# Patient Record
Sex: Male | Born: 1940 | Race: White | Hispanic: No | Marital: Married | Smoking: Never smoker
Health system: Southern US, Community
[De-identification: ages and names within clinical notes are randomized; demographics above are authoritative.]

## PROBLEM LIST (undated history)

## (undated) DIAGNOSIS — R7303 Prediabetes: Secondary | ICD-10-CM

## (undated) DIAGNOSIS — I428 Other cardiomyopathies: Secondary | ICD-10-CM

## (undated) DIAGNOSIS — M1990 Unspecified osteoarthritis, unspecified site: Secondary | ICD-10-CM

## (undated) DIAGNOSIS — I480 Paroxysmal atrial fibrillation: Secondary | ICD-10-CM

## (undated) DIAGNOSIS — M109 Gout, unspecified: Secondary | ICD-10-CM

## (undated) DIAGNOSIS — I10 Essential (primary) hypertension: Secondary | ICD-10-CM

## (undated) DIAGNOSIS — Z87442 Personal history of urinary calculi: Secondary | ICD-10-CM

## (undated) DIAGNOSIS — R600 Localized edema: Secondary | ICD-10-CM

## (undated) DIAGNOSIS — F419 Anxiety disorder, unspecified: Secondary | ICD-10-CM

## (undated) DIAGNOSIS — C189 Malignant neoplasm of colon, unspecified: Secondary | ICD-10-CM

## (undated) HISTORY — DX: Localized edema: R60.0

## (undated) HISTORY — PX: NASAL SEPTUM SURGERY: SHX37

## (undated) HISTORY — PX: TONSILLECTOMY: SUR1361

## (undated) HISTORY — DX: Essential (primary) hypertension: I10

## (undated) HISTORY — DX: Paroxysmal atrial fibrillation: I48.0

## (undated) HISTORY — DX: Other cardiomyopathies: I42.8

---

## 2009-01-08 ENCOUNTER — Ambulatory Visit: Payer: Self-pay | Admitting: Family Medicine

## 2009-05-01 ENCOUNTER — Ambulatory Visit: Payer: Self-pay | Admitting: Family Medicine

## 2009-11-09 ENCOUNTER — Ambulatory Visit: Payer: Self-pay | Admitting: Family Medicine

## 2010-02-11 ENCOUNTER — Ambulatory Visit: Payer: Self-pay | Admitting: Family Medicine

## 2010-09-18 ENCOUNTER — Other Ambulatory Visit: Payer: Self-pay

## 2010-09-18 MED ORDER — BENAZEPRIL HCL 40 MG PO TABS: 40.0000 mg | ORAL_TABLET | Freq: Every day | ORAL | Status: DC

## 2010-09-24 ENCOUNTER — Other Ambulatory Visit: Payer: Self-pay | Admitting: Family Medicine

## 2010-09-25 ENCOUNTER — Telehealth: Payer: Self-pay | Admitting: Family Medicine

## 2010-09-25 NOTE — Telephone Encounter (Signed)
What do you want me to tell him  

## 2010-09-25 NOTE — Telephone Encounter (Signed)
Benazepril 40 mg

## 2010-09-25 NOTE — Telephone Encounter (Signed)
Called pharmacy and pt to let him know the 40 mg is what he is to take

## 2011-01-06 ENCOUNTER — Other Ambulatory Visit (INDEPENDENT_AMBULATORY_CARE_PROVIDER_SITE_OTHER): Payer: Medicare Other

## 2011-01-06 DIAGNOSIS — Z23 Encounter for immunization: Secondary | ICD-10-CM

## 2011-01-14 ENCOUNTER — Encounter: Payer: Self-pay | Admitting: Family Medicine

## 2011-01-15 ENCOUNTER — Encounter: Payer: Self-pay | Admitting: Family Medicine

## 2011-01-15 ENCOUNTER — Ambulatory Visit (INDEPENDENT_AMBULATORY_CARE_PROVIDER_SITE_OTHER): Payer: Medicare Other | Admitting: Family Medicine

## 2011-01-15 VITALS — BP 112/72 | HR 62 | Ht 69.0 in | Wt 269.0 lb

## 2011-01-15 DIAGNOSIS — Z862 Personal history of diseases of the blood and blood-forming organs and certain disorders involving the immune mechanism: Secondary | ICD-10-CM

## 2011-01-15 DIAGNOSIS — Z8739 Personal history of other diseases of the musculoskeletal system and connective tissue: Secondary | ICD-10-CM | POA: Insufficient documentation

## 2011-01-15 DIAGNOSIS — N529 Male erectile dysfunction, unspecified: Secondary | ICD-10-CM

## 2011-01-15 DIAGNOSIS — E669 Obesity, unspecified: Secondary | ICD-10-CM

## 2011-01-15 DIAGNOSIS — Z8639 Personal history of other endocrine, nutritional and metabolic disease: Secondary | ICD-10-CM

## 2011-01-15 DIAGNOSIS — R7302 Impaired glucose tolerance (oral): Secondary | ICD-10-CM

## 2011-01-15 DIAGNOSIS — R7309 Other abnormal glucose: Secondary | ICD-10-CM

## 2011-01-15 DIAGNOSIS — I10 Essential (primary) hypertension: Secondary | ICD-10-CM

## 2011-01-15 LAB — CBC WITH DIFFERENTIAL/PLATELET
Basophils Absolute: 0 10*3/uL (ref 0.0–0.1)
Basophils Relative: 1 % (ref 0–1)
Eosinophils Absolute: 0.2 10*3/uL (ref 0.0–0.7)
Eosinophils Relative: 3 % (ref 0–5)
HCT: 43.7 % (ref 39.0–52.0)
Lymphocytes Relative: 33 % (ref 12–46)
MCH: 31.8 pg (ref 26.0–34.0)
MCHC: 33.6 g/dL (ref 30.0–36.0)
MCV: 94.6 fL (ref 78.0–100.0)
Monocytes Absolute: 0.6 10*3/uL (ref 0.1–1.0)
RDW: 13.2 % (ref 11.5–15.5)

## 2011-01-15 LAB — COMPREHENSIVE METABOLIC PANEL
AST: 27 U/L (ref 0–37)
BUN: 15 mg/dL (ref 6–23)
Calcium: 9.5 mg/dL (ref 8.4–10.5)
Chloride: 105 mEq/L (ref 96–112)
Creat: 1.49 mg/dL — ABNORMAL HIGH (ref 0.50–1.35)
Total Bilirubin: 1.2 mg/dL (ref 0.3–1.2)

## 2011-01-15 LAB — LIPID PANEL
HDL: 35 mg/dL — ABNORMAL LOW (ref >39)
Total CHOL/HDL Ratio: 4.6 Ratio

## 2011-01-15 NOTE — Patient Instructions (Signed)
We will call you with the results of your blood work.

## 2011-01-15 NOTE — Progress Notes (Signed)
Subjective:    Patient ID: Edwin Wall, male    DOB: Aug 08, 1940, 70 y.o.   MRN: [id number]  HPI He is here for an interval evaluation. He continues on his blood pressure medication as well as aspirin and occasional use of Indocin. He has a previous history of gout but has had very little difficulty with this stating that he thinks the blood pressure medication has kept this from occurring. He has had occasional difficulty with cough and congestion. He also complains of a one-year history of inability to get and maintain an erection. His marriage is going quite well. Family history and social history were reviewed.   Review of Systems  Constitutional: Negative.   HENT: Negative.   Eyes: Negative.   Respiratory: Negative.   Cardiovascular: Negative.   Gastrointestinal: Negative.   Genitourinary: Negative.   Musculoskeletal: Negative.   Skin: Negative.   Neurological: Negative.   Hematological: Negative.   Psychiatric/Behavioral: Negative.        Objective:   Physical Exam BP 112/72  Pulse 62  Ht 5\' 9"  (1.753 m)  Wt 269 lb (122.018 kg)  BMI 39.72 kg/m2  General Appearance:    Alert, cooperative, no distress, appears stated age  Head:    Normocephalic, without obvious abnormality, atraumatic  Eyes:    PERRL, conjunctiva/corneas clear, EOM's intact, fundi    benign  Ears:    Normal TM's and external ear canals  Nose:   Nares normal, mucosa normal, no drainage or sinus   tenderness  Throat:   Lips, mucosa, and tongue normal; teeth and gums normal  Neck:   Supple, no lymphadenopathy;  thyroid:  no   enlargement/tenderness/nodules; no carotid   bruit or JVD  Back:    Spine nontender, no curvature, ROM normal, no CVA     tenderness  Lungs:     Clear to auscultation bilaterally without wheezes, rales or     ronchi; respirations unlabored  Chest Wall:    No tenderness or deformity   Heart:    Regular rate and rhythm, S1 and S2 normal, no murmur, rub   or gallop  Breast  Exam:    No chest wall tenderness, masses or gynecomastia  Abdomen:     Soft, non-tender, nondistended, normoactive bowel sounds,    no masses, no hepatosplenomegaly  Genitalia:    Normal male external genitalia without lesions.  Testicles without masses.  No inguinal hernias.  Rectal:   deferred   Extremities:   No clubbing, cyanosis or edema  Pulses:   2+ and symmetric all extremities  Skin:   Skin color, texture, turgor normal, no rashes or lesions  Lymph nodes:   Cervical, supraclavicular, and axillary nodes normal  Neurologic:   CNII-XII intact, normal strength, sensation and gait; reflexes 2+ and symmetric throughout          Psych:   Normal mood, affect, hygiene and grooming.          Assessment & Plan:   1. Obesity (BMI 30-39.9)  CBC with Differential, Comprehensive metabolic panel, Lipid panel  2. Hypertension  CBC with Differential, Comprehensive metabolic panel, Lipid panel  3. History of gout    4. Glucose intolerance (impaired glucose tolerance)  CBC with Differential, Comprehensive metabolic panel, Lipid panel  5. ED (erectile dysfunction)     I discussed diet and exercise with him in regard to cut back on carbs and increasing his physical activities. I also discussed erectile dysfunction. Given a sample of Cialis 20 mg  with instructions on proper and possible side effects. He will let me know how this works.

## 2011-03-19 ENCOUNTER — Telehealth: Payer: Self-pay | Admitting: Family Medicine

## 2011-03-19 NOTE — Telephone Encounter (Signed)
DONE

## 2011-05-18 ENCOUNTER — Other Ambulatory Visit: Payer: Self-pay | Admitting: Family Medicine

## 2016-11-11 ENCOUNTER — Other Ambulatory Visit: Payer: Self-pay | Admitting: Gastroenterology

## 2016-11-11 DIAGNOSIS — R933 Abnormal findings on diagnostic imaging of other parts of digestive tract: Secondary | ICD-10-CM

## 2016-11-11 DIAGNOSIS — D3A025 Benign carcinoid tumor of the sigmoid colon: Secondary | ICD-10-CM

## 2016-11-11 DIAGNOSIS — R140 Abdominal distension (gaseous): Secondary | ICD-10-CM

## 2016-11-11 HISTORY — PX: COLONOSCOPY W/ BIOPSIES AND POLYPECTOMY: SHX1376

## 2016-11-13 ENCOUNTER — Ambulatory Visit
Admission: RE | Admit: 2016-11-13 | Discharge: 2016-11-13 | Disposition: A | Discharge status: Home / Self Care | Payer: Medicare Other | Source: Ambulatory Visit | Attending: Gastroenterology | Admitting: Gastroenterology

## 2016-11-13 DIAGNOSIS — R140 Abdominal distension (gaseous): Secondary | ICD-10-CM

## 2016-11-13 DIAGNOSIS — R933 Abnormal findings on diagnostic imaging of other parts of digestive tract: Secondary | ICD-10-CM

## 2016-11-13 DIAGNOSIS — D3A025 Benign carcinoid tumor of the sigmoid colon: Secondary | ICD-10-CM

## 2016-11-13 MED ORDER — IOPAMIDOL (ISOVUE-300) INJECTION 61%
100.0000 mL | Freq: Once | INTRAVENOUS | Status: AC | PRN
  Administered 2016-11-13: 80 mL via INTRAVENOUS

## 2016-11-14 IMAGING — CT CT ABD-PELV W/ CM
1 of 3 series · 13 of 32 positions shown, 17 images · IV contrast (APPLIED)
Comparison: None.

CLINICAL DATA: Sigmoid carcinoma detected on recent colonoscopy

EXAM:
CT ABDOMEN AND PELVIS WITH CONTRAST
TECHNIQUE: Multidetector CT imaging of the abdomen and pelvis was performed
using the standard protocol following bolus administration of
intravenous contrast. Oral contrast was also administered.
CONTRAST:  80mL [2Q] IOPAMIDOL ([2Q]) INJECTION 61%

[Series 2: abd/pelvis w/cm · axial · 0.88mm/px · z∈[-515,-35]mm · 13 of 108 slices shown, 17 images]
[im 6/108  soft-tissue]
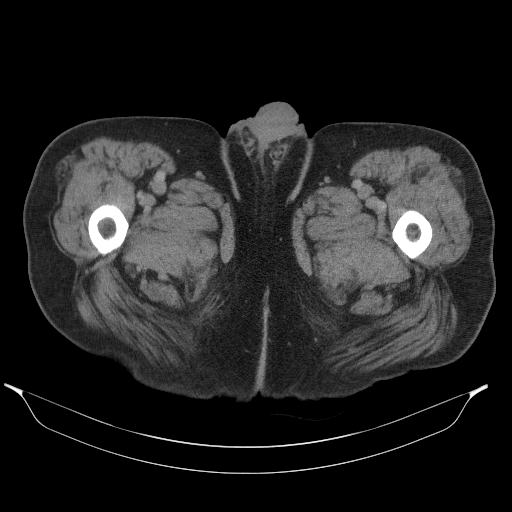
[im 6/108  bone]
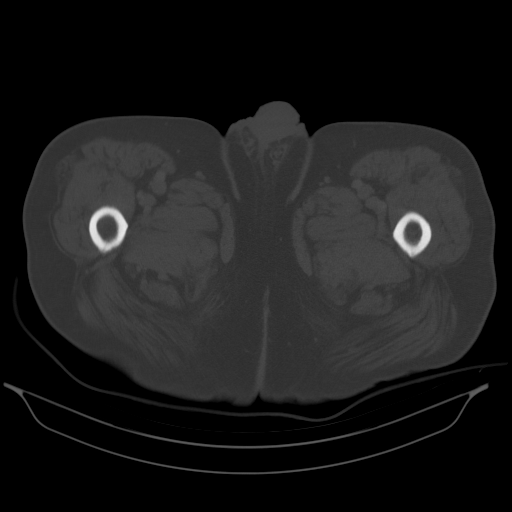
[im 17/108  soft-tissue]
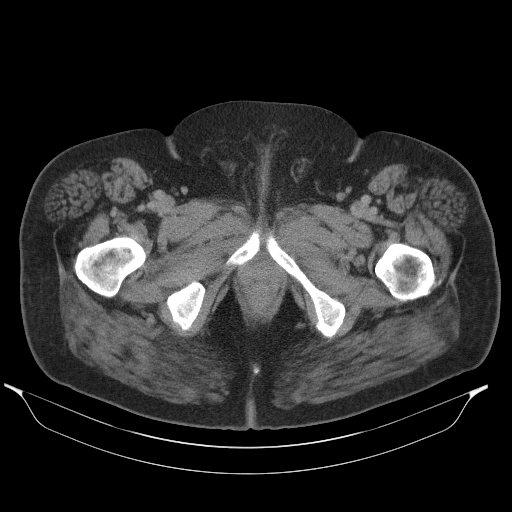
[im 27/108  soft-tissue]
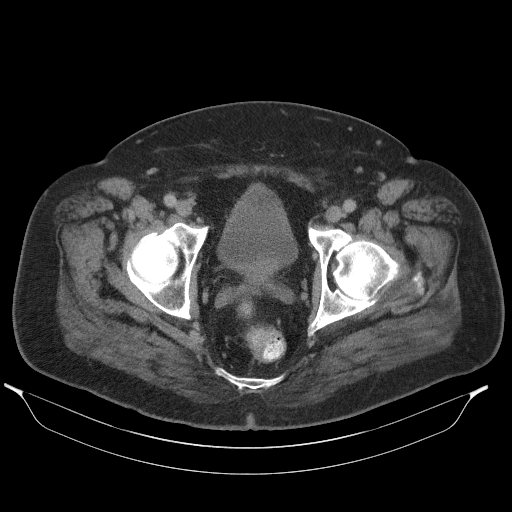
[im 38/108  soft-tissue]
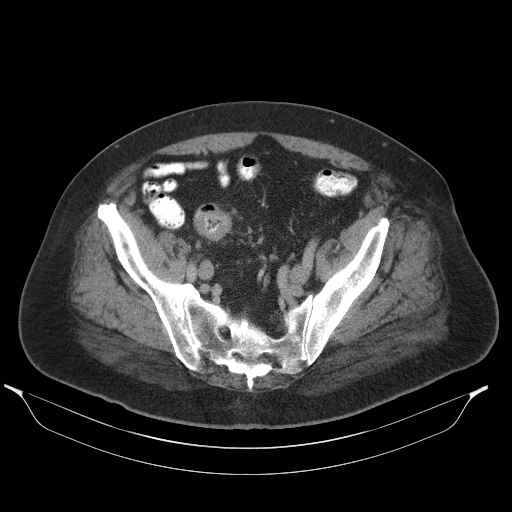
[im 43/108  soft-tissue]
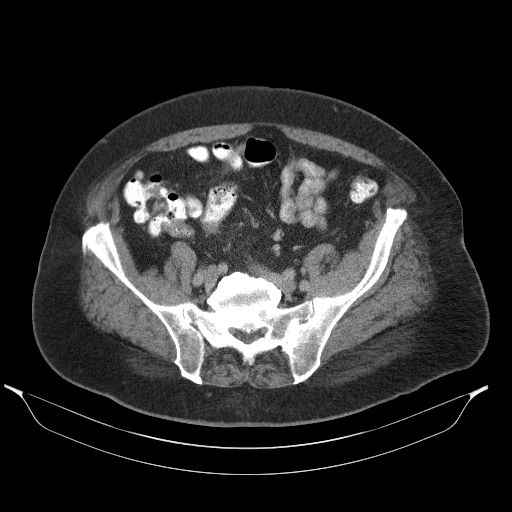
[im 54/108  soft-tissue]
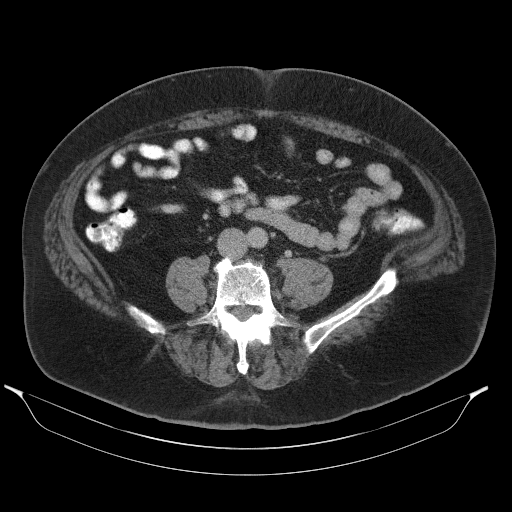
[im 65/108  soft-tissue]
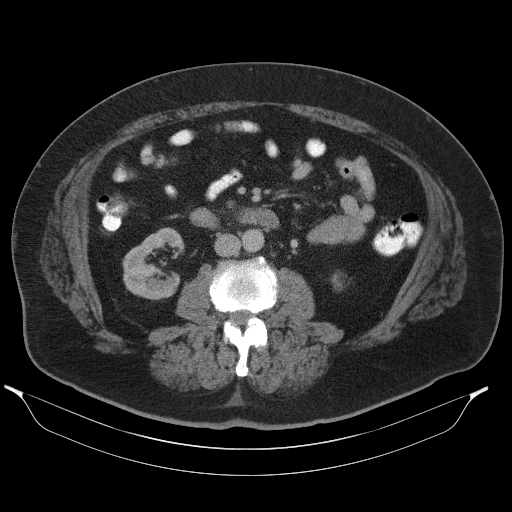
[im 70/108  soft-tissue]
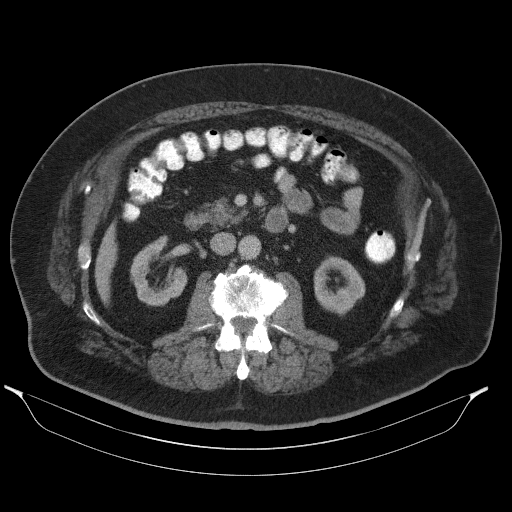
[im 81/108  soft-tissue]
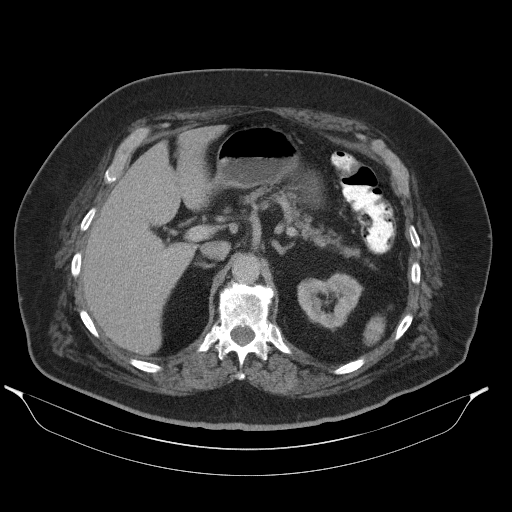
[im 81/108  bone]
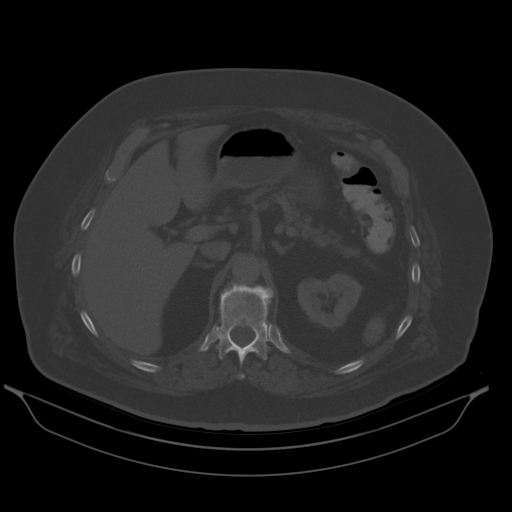
[im 86/108  lung]
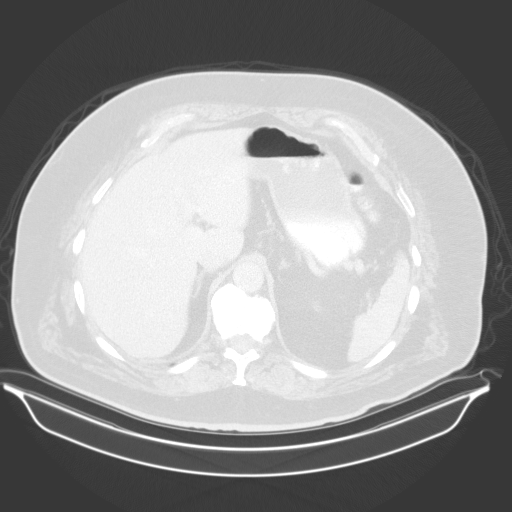
[im 91/108  soft-tissue]
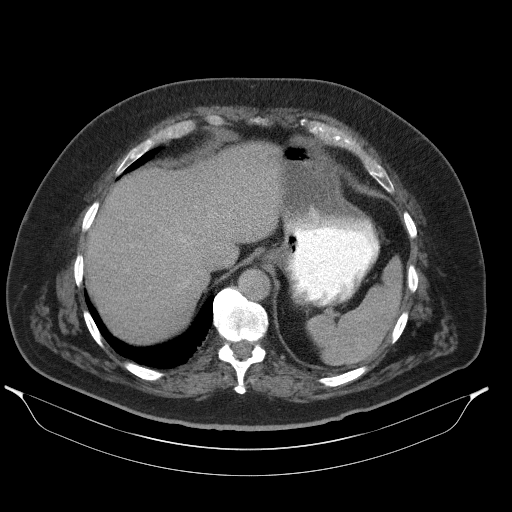
[im 91/108  lung]
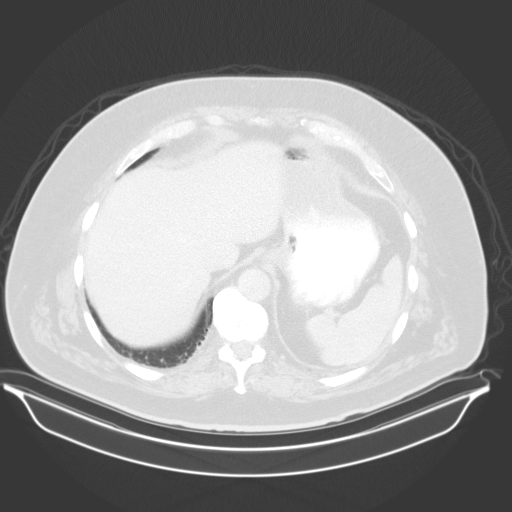
[im 97/108  lung]
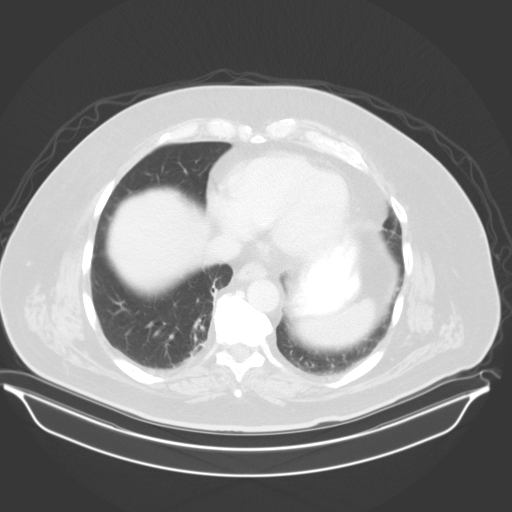
[im 102/108  soft-tissue]
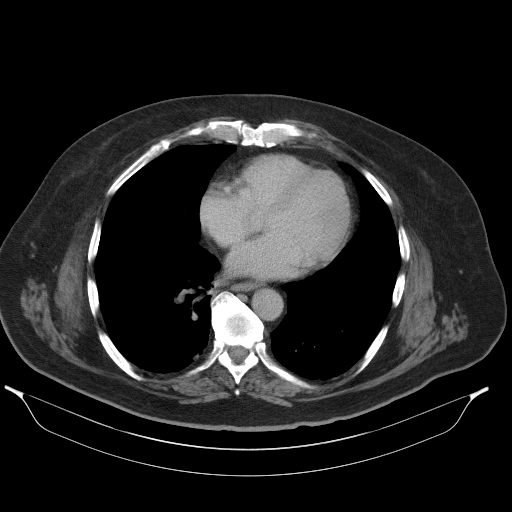
[im 102/108  lung]
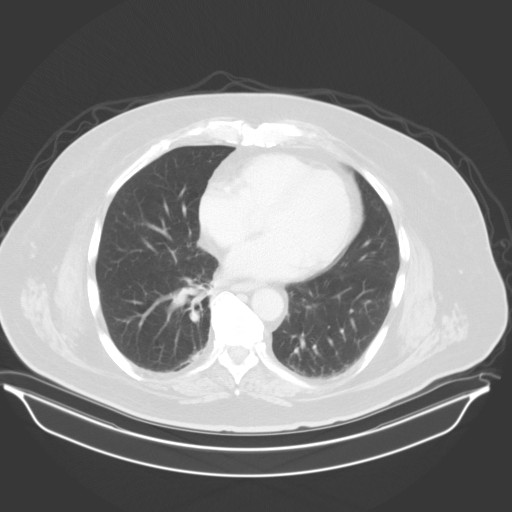

[13 of 32 positions shown; findings below may reference images not displayed]

FINDINGS: Lower chest: There is mild bibasilar atelectasis. Lung bases
otherwise are clear.

Hepatobiliary: No focal liver lesions are apparent. Gallbladder is
contracted without wall thickening. There is no biliary duct
dilatation.

Pancreas: No pancreatic mass or inflammatory focus.

Spleen: No splenic lesions are evident.

Adrenals/Urinary Tract: Adrenals appear normal bilaterally. Kidneys
bilaterally show no evident mass or hydronephrosis on either side.
Mild fetal lobulation is noted, an anatomic variant. There is no
renal or ureteral calculus apparent on either side. Urinary bladder
is midline with wall thickness within normal limits.

Stomach/Bowel: There is somewhat circumferential wall thickening in
the mid the distal sigmoid colon, likely the site of carcinoma
detected on recent colonoscopy. There is no surrounding mesenteric
thickening or extension of tumor beyond the wall of the colon in
this area. No other bowel wall thickening is noted elsewhere in the
bowel. No mesenteric thickening evident elsewhere. No bowel
obstruction. No free air or portal venous air.

Vascular/Lymphatic: There are foci of atherosclerotic calcification
in the aorta. There is no aneurysm evident. There is moderate
calcification in each proximal renal artery. Major mesenteric
vessels appear patent in the abdomen and pelvis. There is no
appreciable adenopathy in the abdomen or pelvis.

Reproductive: Prostate and seminal vesicles appear normal in size
and contour. No pelvic mass evident.

Other: Appendix appears unremarkable. No abscess or ascites is
apparent in the abdomen or pelvis. There is a rather minimal ventral
hernia containing only fat.

Musculoskeletal: There is degenerative change in the lumbar spine.
There is moderately severe spinal stenosis at L3-4 and L4-5 due to
diffuse disc protrusion and bony hypertrophy. There are no blastic
or lytic bone lesions. There is no intramuscular or abdominal wall
lesion.
IMPRESSION: 1. Somewhat circumferential colonic wall thickening in the mid to
distal sigmoid colon extending over a distance of approximately
cm, best appreciable on coronal slice 50 series 3, sagittal slice 55
series 4, and axial slices 71 through 75 series 2. This area is felt
to be indicative of neoplasm and is presumably the site of neoplasm
noted on recent colonoscopy. No similar bowel wall thickening is
seen elsewhere. The adjacent mesenteric in the mid the distal
sigmoid colon does not appear thickened, and there is no evidence by
CT of gross tumor extension beyond the wall of the colon.

2. No adenopathy. No evident liver lesions. No metastatic focus
identified in the abdomen or pelvis.

3.  No bowel obstruction.  No abscess.  Appendix appears normal.

4.  No renal or ureteral calculus.  No hydronephrosis.

5. Spinal stenosis at L3-4 and L4-5 due to diffuse bony hypertrophy
and diffuse disc protrusion. There is multilevel arthropathy in the
lower thoracic and lumbar regions.

6. Aortic atherosclerosis. Moderate calcification in each proximal
renal artery noted.

7.  Rather minimal ventral hernia containing only fat.

Aortic Atherosclerosis ([2Q]-[2Q]).

## 2016-11-17 ENCOUNTER — Telehealth: Payer: Self-pay

## 2016-11-17 NOTE — Telephone Encounter (Signed)
[name] called to clarify who would be making his oncology appointments. I relayed that I spoke with Dr. Benay Spice and patient would be seeing a surgeon from [facility] prior to meeting with Dr. Benay Spice. I also explained that I had spoken with the referral coordinator at [facility] and someone from [place] would call patient to schedule a surgical appointment. Patient verbalized understanding.

## 2016-11-17 NOTE — Progress Notes (Signed)
  Oncology Nurse Navigator Documentation  Navigator Location: [facility] (11/17/16 1237) Referral date to RadOnc/MedOnc: 11/17/16 (11/17/16 1237) )Navigator Encounter Type: Introductory phone call (11/17/16 1237)  Referral from Dr. Benson Norway at [facility] received today. I called patient to introduce myself and to explain that [facility] had received a med/onc referral from Dr. Benson Norway. I let patient know that we had also made a surgical referral and that he could expect a phone call to schedule a new patient appointment within a week or two. I gave MR. Hickam my contact information if he has any questions moving forward.   Abnormal Finding Date: 11/11/16 (11/17/16 1237)                   Treatment Phase: Abnormal Scans (11/17/16 1237) Barriers/Navigation Needs: Coordination of Care (11/17/16 1237)   Interventions: Referrals;Coordination of Care;Psycho-social support (11/17/16 1237) Referrals: Other (Surgical) (11/17/16 1237)          Acuity: Level 2 (11/17/16 1237)   Acuity Level 2: Initial guidance, education and coordination as needed;Assistance expediting appointments;Other (Surgical Referral) (11/17/16 1237)     Time Spent with Patient: 15 (11/17/16 1237)

## 2016-11-19 ENCOUNTER — Telehealth: Payer: Self-pay

## 2016-11-19 NOTE — Telephone Encounter (Signed)
Called and left VM to see if patient has any questions. Patient is scheduled with Dr. Barry Dienes on 11/21/16 @ 9:15. I left my contact information for questions or concerns.

## 2016-11-21 ENCOUNTER — Other Ambulatory Visit: Payer: Self-pay | Admitting: General Surgery

## 2016-12-12 NOTE — Pre-Procedure Instructions (Signed)
Edwin Wall  12/12/2016      [id number] [place] [id number]  [id number]: [phone]: [phone]   Your procedure is scheduled on 12/17/16.  [facility]  Call this number if you have problems the morning of surgery:  [phone]   Remember:  Do not eat food or drink liquids after midnight.  Take these medicines the morning of surgery with A SIP OF WATER ---ativan   Do not wear jewelry, make-up or nail polish.  Do not wear lotions, powders, or perfumes, or deoderant.  Do not shave 48 hours prior to surgery.  Men may shave face and neck.  Do not bring valuables to the hospital.  [facility] or [facility].  Contacts, dentures or bridgework may not be worn into surgery.  Leave your suitcase in the car.  After surgery it may be brought to your room.  For patients admitted to the hospital, discharge time will be determined by your treatment team.  Patients discharged the day of surgery will not be allowed to drive home.   Name and phone number of your driver:   Special instructions:  Do not take any aspirin,anti-inflammatories,vitamins,or herbal supplements 5-7 days prior to surgery.  Enhanced Recovery After Surgery (ERAS) Pathway   RATIONALIZATION Enhanced Recovery After Surgery (ERAS) is a multimodal, multidisciplinary approach to the care of the surgical patient. Enhanced Recovery After Surgery process implementation involves a team consisting of surgeons, anesthetists, an ERAS coordinator (often a nurse or a physician assistant), and staff from units that care for the surgical patient. Education of & buy in from patients is essential to success. The care protocol is based on published evidence.     Key strategies of Enhanced Recovery After Surgery protocols include  evidence-based adjustments through the entire surgical experience: switching from overnight fasting to carbohydrate drinks two hours before surgery, nutritional optimization, minimally invasive approaches to avoid large incisions, restriction of IV fluids to seek balance rather than large volumes, minimizing narcotics while using robust multimodal nonnarcotic pain control, early and increasing mobilization, and rapid diet advancement.    ERAS protocols have resulted in shorter length of hospital stay by 30% to 50% with similar reductions in complications.  Readmission rates and overall costs are reduced. The elements of the protocol reduce the stress of the operation to optimize recovery.  Success requires input and feedback from patients, nurses, surgeons, anesthesiologists, therapists, administration, and hospital staff.  Local multispecialty ERAS teams from hospitals are trained to implement ERAS processes. Audit of process compliance and patient outcomes are important features. Enhanced Recovery After Surgery started mainly with colorectal surgery but has been shown to improve outcomes in almost all major surgical specialties.     PATHWAY OVERVIEW Below is the elements of care when your patient is on the ERAS pathway.  These are not actual orders.   Prior to surgery Patient to receive ERAS education  Anesthesia posted by surgeon as ERAS Pathway  Preop orders note ERAS Pathway  Nutritional shakes BID x 5 days (Immune enhancing, ex: Ensure Surgery).   Consider surgery postponement if serum albumin <3. Smoking cessation education / enforcement (>4 weeks preop ideal,  especially bariatrics & hernias) Diabetes control (HgbA1C preop lab to confirm level <9). Obstructive sleep apnea screening if BMI >35 or high-risk factors MRSA screening (if POSITIVE:  decolonization & add IV Vancomycin to preop IV antibiotics) Chlorhexidine skin decontamination.           Preoperative / Short Stay PO  status:  Clear liquids under 2 hours preop Carb-loading drink upon patient waking up on morning of surgery (Ensure Pre-Surgery 283m), >2 hr preop NPO 2 hours preop ERAS            listed on Operating room sheet & Board.  Anesthesia notified IVF KVO (unless signs of hypovolemia) in Short Stay & Holding area  Post Operative Nausea and Vomiting (PONV) Identification / Intervention: Risk-stratify patient Scopolamine TD placement if ? 3 Risk Factors Ileus Prevention: Alvimopan 158mPO on call to OR VTE prophylaxis: SCDs BLE Heparin or Enoxaparin SQ on call to OR (if high risk) Pre-Emptive Pain Control: Medications on call to OR: Acetaminophen 100010mO Gabapentin 300m61m  Celecoxib 400mg51mld if age >65 o>70r > 1.5) Regional block by anesthesia (preference between surgeon & anesthesia) TAP block for open abdominal surgery, hernia, etc. Epidural   Appropriate IV antibiotics <60 minutes from incision Limit preoperative opioids and benzodiazepine         INTRAOPERATIVE PATHWAY General Anesthesia Induction Agents:  Ketamine (0.5 mg/kg IBW with induction) Rest at discretion of anesthesiologist PONV Prophylaxis  Dexamethasone mg/kg IBW (8 mg max dose, 4mg m46mdose for diabetics) Use PONV Scoring System Mechanical Ventilation             Per Lung Protective Ventilation PROMPT  Start 6-8 ml/kg tidal volume per          Ideal Body Weight (IBW*) PEEP 4 Opioids: Only use Fentanyl intraoperatively Non-Narcotic Adjuncts:  Lidocaine Infusion (1.5 mg/kg IBW/hour or 25 mcg/kg IBW/min)             Discontinue 30 minutes before extubating. Do not use lidocaine with patients with epidurals, TAP blocks, liver resection/dysfunction, or 2nd or 3rd deg heart block Ketamine  0.5 mg/kg IBW with induction Infusion (0.2 mg/kg/hr)             If patient on chronic opioids, Stop 1 hour prior to end of surgery Esmolol           or Labetalol boluses for          non-specific increase in           heart rate or blood pressure Adjust MAC of volatile agent based on other adjuvant medications  BIS-Monitor: maintain BIS range of 45-60      Normothermia (BAIR hugger, esp if core body temperature < 35C) Glycemic control (glucose < 180) Room temperature > 52F / 23C Drains (Oral, NG, abdominal) should not       be routinely placed.  If needed, discuss with surgeon           Fluid Management Maintenance Intraop:  LR or Normosol/Isolyte (begins when entering OR): 500 mL/hr Laparoscopic / Robotic 800 mL/hr Open If MAP < 60mmHg46me stepwise        approach: Ensure volatile concentration <1 MAC Administer 250            mL bolus crystalloid, repeat x1 PRN Communicate with surgeon the patients hypotensive status and additional fluid requirements Administer Albumin 25% in 100         mL SURGERY Try minimally invasive approach Double glove - ALL that are scrubbed Wound protector as possible, especially > Class 2 cases Change gown/gloves & separate closure instruments for > Class 2 cases Consider leaving SQ open  with wound vac/packing in Class 4 or unstable patients Local anesthesia field block infiltration, consider Experel (liposomal bupivacaine) diluted total 157m *IBW (in kg) = ((Ht-60) x 2.3) + 50 (male) vs + 479.5(male)         PACU PATHWAY Communicate ERAS Status to PACU team Postop Analgesia: Ice pack or heating pad to area / incision with pain Opiods: (Stepwise Recommendations)  Fentanyl as first line choice Hydromorphone as secondary medication (if fentanyl inadequate) Postop PONV prophylaxis Consider PONV repeat dose of Zofran 4 mg Phenergan 6.25 mg IV diluted in 100 mL NS Normothermia (BAIR hugger, esp if temperature <65F) Oxygen supplementation (Start with 100% NRB, wean to keep sats > 92%)     Nursing Interventions Ice pack or heating pad to area / incision with pain If diabetic or glucose > 140 mg/dl, notify physician for Glycemic Control (SSI) Order  Set   Mobilization Standing & out of bed to chair evening of surgery, POD#0 Ambulate in hallways by POD#1 PT consult as appropriate   Diet Clear liquids starting POD#0, advance diet as tolerated (ADAT) to full liquids ADAT to soft diet starting lunch time of POD#1 Nutritional shakes BID x 5 days (Immune enhancing, ex: Ensure Surgery) If patient has poorly controlled nausea or emesis, change diet to NPO except ice chips & sips with meds   Respiratory Oxygen therapy.  Continuous for 24 hours, 2L [place] minimum, keeping sats > 92%.  May wean to room air on POD#1, keeping sats > 88% Turn cough deep breathe Incentive Spirometry   IV Fluids Normal saline 563mhour overnight Saline lock IV on morning of POD#1 Normal Saline 100061mvery 8 hours PRN 500m34murs, administer over 2 hours if SBP < 90 or Urine output <250mL23m8 hours, for 2 days  Postoperative Analgesia Ice pack to area / incision with pain q4h PRN Heating pad to area / incision with pain q4h PRN  Ileus prophylaxis Alvimopan 12mg,23ml, 2 times daily, starting S+1 for 14 doses, postop Psyllium 1 packet daily.  Hold if > BM in last 24 hours.       References:     http:/SSLUsers.sep://erassociety.org     Articles for ERAS overview: KehletNichola Sizervidence-based surgical care and the evolution of fast-track surgery. Ann Surg 2008; 248:189.    Ljungqvist O, ScoHerbie Baltimorenhanced Recovery After Surgery: A Review. JAMA Surg 2017; 152:292.   Feldheiser A, AziErline Levinel. Enhanced Recovery After Surgery (ERAS) for gastrointestinal surgery, part 2: consensus statement for anaesthesia practice. Acta AnaestNevin Bloodgood 2016; 60:28988:416ou RValentina Gue Leon-Casasola OA, et al. Management of Postoperative Pain: A Clinical Practice Guideline From the American Pain Society, the American Society of Regional Anesthesia and Pain Medicine, and the American Society of  Anesthesiologists' Committee on Regional Anesthesia, Executive Committee, and AdminiGeneral Motorsin 2016; 17:131.   Stone AB, Grant MC, Pio Roda C, et al. Implementation Costs of an Enhanced Recovery After Surgery Program in the [place]: A FinancCustomer service manager on Experiences at a QuaterAetna Coll Surg 2016; 222:; 606:301Please read over the following fact sheets that you were given.

## 2016-12-15 ENCOUNTER — Encounter (HOSPITAL_COMMUNITY)
Admission: RE | Admit: 2016-12-15 | Discharge: 2016-12-15 | Disposition: A | Discharge status: Home / Self Care | Payer: Medicare Other | Source: Ambulatory Visit | Attending: General Surgery | Admitting: General Surgery

## 2016-12-15 ENCOUNTER — Encounter (HOSPITAL_COMMUNITY): Payer: Self-pay

## 2016-12-15 HISTORY — DX: Anxiety disorder, unspecified: F41.9

## 2016-12-15 HISTORY — DX: Unspecified osteoarthritis, unspecified site: M19.90

## 2016-12-15 LAB — COMPREHENSIVE METABOLIC PANEL
ALK PHOS: 57 U/L (ref 38–126)
ALT: 14 U/L — AB (ref 17–63)
ANION GAP: 9 (ref 5–15)
AST: 21 U/L (ref 15–41)
Albumin: 3.3 g/dL — ABNORMAL LOW (ref 3.5–5.0)
BUN: 18 mg/dL (ref 6–20)
CO2: 24 mmol/L (ref 22–32)
Calcium: 9.1 mg/dL (ref 8.9–10.3)
Chloride: 105 mmol/L (ref 101–111)
Creatinine, Ser: 1.5 mg/dL — ABNORMAL HIGH (ref 0.61–1.24)
GFR, EST AFRICAN AMERICAN: 51 mL/min — AB (ref >60)
GFR, EST NON AFRICAN AMERICAN: 44 mL/min — AB (ref >60)
Glucose, Bld: 134 mg/dL — ABNORMAL HIGH (ref 65–99)
Potassium: 3.4 mmol/L — ABNORMAL LOW (ref 3.5–5.1)
SODIUM: 138 mmol/L (ref 135–145)
Total Bilirubin: 1 mg/dL (ref 0.3–1.2)
Total Protein: 6.6 g/dL (ref 6.5–8.1)

## 2016-12-15 LAB — CBC WITH DIFFERENTIAL/PLATELET
Basophils Absolute: 0 10*3/uL (ref 0.0–0.1)
Basophils Relative: 1 %
EOS ABS: 0.4 10*3/uL (ref 0.0–0.7)
Eosinophils Relative: 4 %
HCT: 41 % (ref 39.0–52.0)
HEMOGLOBIN: 13.4 g/dL (ref 13.0–17.0)
Lymphocytes Relative: 25 %
Lymphs Abs: 2.2 10*3/uL (ref 0.7–4.0)
MCH: 30 pg (ref 26.0–34.0)
MCHC: 32.7 g/dL (ref 30.0–36.0)
MCV: 91.7 fL (ref 78.0–100.0)
Monocytes Absolute: 0.9 10*3/uL (ref 0.1–1.0)
Monocytes Relative: 10 %
NEUTROS PCT: 60 %
Neutro Abs: 5.2 10*3/uL (ref 1.7–7.7)
PLATELETS: 277 10*3/uL (ref 150–400)
RBC: 4.47 MIL/uL (ref 4.22–5.81)
RDW: 13.7 % (ref 11.5–15.5)
WBC: 8.7 10*3/uL (ref 4.0–10.5)

## 2016-12-15 LAB — ABO/RH: ABO/RH(D): A POS

## 2016-12-15 LAB — HEMOGLOBIN A1C
HEMOGLOBIN A1C: 6.6 % — AB (ref 4.8–5.6)
MEAN PLASMA GLUCOSE: 142.72 mg/dL

## 2016-12-15 LAB — TYPE AND SCREEN
ABO/RH(D): A POS
Antibody Screen: NEGATIVE

## 2016-12-15 LAB — PROTIME-INR
INR: 0.98
PROTHROMBIN TIME: 12.9 s (ref 11.4–15.2)

## 2016-12-15 MED ORDER — CHLORHEXIDINE GLUCONATE CLOTH 2 % EX PADS
6.0000 | MEDICATED_PAD | Freq: Once | CUTANEOUS | Status: DC
Start: 2016-12-15 — End: 2016-12-16

## 2016-12-15 MED ORDER — CHLORHEXIDINE GLUCONATE CLOTH 2 % EX PADS: 6.0000 | MEDICATED_PAD | Freq: Once | CUTANEOUS | Status: DC

## 2016-12-15 NOTE — H&P (Signed)
Edwin Wall 11/21/2016 9:54 AM Location: [facility] Patient #: [id number] DOB: [birth year] Undefined / Language: Edwin Wall / Race: White Male   History of Present Illness [name] MD; 11/21/2016 10:43 AM) The patient is a 76 year old male who presents with colorectal cancer. Patient is a 76 year old male who presented with GI bleeding. He was referred to Dr. Almyra Free of GI performed colonoscopy. He was found to have 3 small polyps in the ascending and transverse colon. There was a 15 mm polyp in the transverse colon. 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 He is referred for resection. MSI testing is pending. The patient denies abdominal pain. He has not had any weight loss or bloating. He denies nausea and vomiting. The healthy with a history of hypertension. He has some osteoarthritis. He denies any chest pain, dizziness, shortness of breath, exercise difficulty other than due to joints. He has not had a colonoscopy before this one. There was one set up 7 years ago that he ended up not having that he cannot remember why. His wife he is to work at [employer] but has been retired for 20 years.  Pathology is in our chart and is from [employer] number [phone].  CT abd/pelvis 11/13/2016 IMPRESSION: 1. Somewhat circumferential colonic wall thickening in the mid to distal sigmoid colon extending over a distance of approximately 2.8 cm, best appreciable on coronal slice 50 series 3, sagittal slice 55 series 4, and axial slices 71 through 75 series 2. This area is felt to be indicative of neoplasm and is presumably the site of neoplasm noted on recent colonoscopy. No similar bowel wall thickening is seen elsewhere. The adjacent mesenteric in the mid the distal sigmoid colon does not appear thickened, and there is no evidence by CT of gross tumor extension beyond the  wall of the colon.  2. No adenopathy. No evident liver lesions. No metastatic focus identified in the abdomen or pelvis.  3. No bowel obstruction. No abscess. Appendix appears normal.  4. No renal or ureteral calculus. No hydronephrosis.  5. 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 There is multilevel arthropathy in the lower thoracic and lumbar regions.  6. Aortic atherosclerosis. Moderate calcification in each proximal renal artery noted.  7. Rather minimal ventral hernia containing only fat.   Past Surgical History (Tanisha A. Owens Shark, [place]; 11/21/2016 9:54 AM) Tonsillectomy   Diagnostic Studies History (Tanisha A. Owens Shark, [place]; 11/21/2016 9:54 AM) Colonoscopy  within last year  Allergies (Tanisha A. Owens Shark, [place]; 11/21/2016 9:55 AM) No Known Drug Allergies 11/21/2016 Allergies Reconciled   Medication History (Tanisha A. Owens Shark, [place]; 11/21/2016 9:56 AM) LORazepam ('1MG'$  Tablet, Oral) Active. Benazepril-Hydrochlorothiazide (20-12.'5MG'$  Tablet, Oral) Active. GaviLyte-G (236GM For Solution, Oral) Active. Diclofenac Sodium ('75MG'$  Tablet DR, Oral) Active. Medications Reconciled  Social History (Tanisha A. Owens Shark, [place]; 11/21/2016 9:54 AM) Caffeine use  Carbonated beverages, Tea. No alcohol use  No drug use  Tobacco use  Never smoker.  Family History (Tanisha A. Owens Shark, [place]; 11/21/2016 9:54 AM) Colon Cancer  Brother. Colon Polyps  Brother. Depression  Son. Heart Disease  Brother, Father. Hypertension  Brother, Father. Respiratory Condition  Father, Mother.  Other Problems (Tanisha A. Owens Shark, [place]; 11/21/2016 9:54 AM) Anxiety Disorder  Arthritis  Colon Cancer  Hemorrhoids  High blood pressure     Review of Systems (Tanisha A. Brown RMA; 11/21/2016 9:54 AM) General Present-  Weight Loss. Not Present- Appetite Loss, Chills, Fatigue, Fever, Night Sweats and Weight Gain. Skin Present- Dryness. Not Present-  Change in Wart/Mole, Hives, Jaundice, New Lesions, Non-Healing Wounds, Rash and Ulcer. HEENT Present- Hearing Loss, Ringing in the Ears, Seasonal Allergies and Wears glasses/contact lenses. Not Present- Earache, Hoarseness, Nose Bleed, Oral Ulcers, Sinus Pain, Sore Throat, Visual Disturbances and Yellow Eyes. Respiratory Present- Chronic Cough and Snoring. Not Present- Bloody sputum, Difficulty Breathing and Wheezing. Breast Not Present- Breast Mass, Breast Pain, Nipple Discharge and Skin Changes. Cardiovascular Not Present- Chest Pain, Difficulty Breathing Lying Down, Leg Cramps, Palpitations, Rapid Heart Rate, Shortness of Breath and Swelling of Extremities. Gastrointestinal Present- Bloody Stool, Change in Bowel Habits, Chronic diarrhea and Hemorrhoids. Not Present- Abdominal Pain, Bloating, Constipation, Difficulty Swallowing, Excessive gas, Gets full quickly at meals, Indigestion, Nausea, Rectal Pain and Vomiting. Male Genitourinary Present- Impotence and Urine Leakage. Not Present- Blood in Urine, Change in Urinary Stream, Frequency, Nocturia, Painful Urination and Urgency.  Vitals (Tanisha A. Brown RMA; 11/21/2016 9:55 AM) 11/21/2016 9:54 AM Weight: 248 lb Height: 69in Body Surface Area: 2.26 m Body Mass Index: 36.62 kg/m  Temp.: 97.49F  Pulse: 83 (Regular)  P.OX: 97% (Room air) BP: 116/74 (Sitting, Left Arm, Standard)       Physical Exam [name] MD; 11/21/2016 10:44 AM) General Mental Status-Alert. General Appearance-Consistent with stated age. Hydration-Well hydrated. Voice-Normal.  Head and Neck Head-normocephalic, atraumatic with no lesions or palpable masses. Trachea-midline. Thyroid Gland Characteristics - normal size and consistency.  Eye Eyeball - Bilateral-Extraocular movements intact. Sclera/Conjunctiva - Bilateral-No scleral icterus.  Chest and Lung Exam Chest and lung exam reveals -quiet, even and easy respiratory effort with  no use of accessory muscles and on auscultation, normal breath sounds, no adventitious sounds and normal vocal resonance. Inspection Chest Wall - Normal. Back - normal.  Cardiovascular Cardiovascular examination reveals -normal heart sounds, regular rate and rhythm with no murmurs and normal pedal pulses bilaterally.  Abdomen Inspection Inspection of the abdomen reveals - No Hernias. Palpation/Percussion Palpation and Percussion of the abdomen reveal - Soft, Non Tender, No Rebound tenderness, No Rigidity (guarding) and No hepatosplenomegaly. Auscultation Auscultation of the abdomen reveals - Bowel sounds normal.  Neurologic Neurologic evaluation reveals -alert and oriented x 3 with no impairment of recent or remote memory. Mental Status-Normal.  Musculoskeletal Global Assessment -Note: no gross deformities.  Normal Exam - Left-Upper Extremity Strength Normal and Lower Extremity Strength Normal. Normal Exam - Right-Upper Extremity Strength Normal and Lower Extremity Strength Normal.  Lymphatic Head & Neck  General Head & Neck Lymphatics: Bilateral - Description - Normal. Axillary  General Axillary Region: Bilateral - Description - Normal. Tenderness - Non Tender. Femoral & Inguinal  Generalized Femoral & Inguinal Lymphatics: Bilateral - Description - No Generalized lymphadenopathy.    Assessment & Plan [name] MD; 11/21/2016 10:46 AM) CANCER OF SIGMOID COLON (C18.7) Impression: The patient presents with a new sigmoid colon cancer. The stage is not determined yet without the final path, but he has no clinical evidence of lymphatic or metastatic disease.  He will need a laparoscopic assisted sigmoid colectomy. He has not ever had prior abdominal surgery so I would expect that he would not have a significant burden of adhesions.  I discussed the surgical procedure as well as time in the hospital and postoperative recovery. I reviewed risks of surgery  including bleeding, infection, wound breakdown, hernia, blood clots, heart or lung complications, anastomotic leak potentially requiring additional surgery and potentially an ostomy. I discussed risk  of death.  The patient would like to have surgery as soon as possible. He is given a copy of the colon book. I advised him that he would have a bowel prep. He desires to have surgery at [facility] as his wife worked at [employer] for 20 years prior to her retirement. Current Plans Pt Education - flb sigmoid colon: discussed with patient and provided information. Schedule for Surgery Pt Education - [facility] Colon Bowel Prep 2018 ERAS/Miralax/Antibiotics Started Neomycin Sulfate '500MG'$ , 2 (two) Tablet SEE NOTE, #6, 11/21/2016, No Refill. Local Order: TAKE TWO TABLETS AT 2 PM, 3 PM, AND 10 PM THE DAY PRIOR TO SURGERY Started Flagyl '500MG'$ , 2 (two) Tablet SEE NOTE, #6, 11/21/2016, No Refill. Local Order: Take at 2pm, 3pm, and 10pm the day prior to your colon operation   Signed by [name], MD (11/21/2016 10:46 AM)

## 2016-12-16 LAB — CEA: CEA: 4.5 ng/mL (ref 0.0–4.7)

## 2016-12-16 NOTE — Progress Notes (Signed)
Anesthesia Chart Review:  Pt is a 76 year old male scheduled for laparoscopic sigmoid colectomy on 12/17/2016 with [name], MD  - PCP is Anastasia Pall, MD (notes in care everywhere)   PMH includes:  HTN, pre-diabetes, colon cancer. Never smoker. BMI 36  Medications include: benazepril-hctz.   BP 117/70   Pulse 73   Temp (!) 36.4 C   Resp 20   Ht 5' 9.5" (1.765 m)   Wt 248 lb 9.6 oz (112.8 kg)   SpO2 98%   BMI 36.19 kg/m   Preoperative labs reviewed.   - HbA1c 6.6, glucose 134 - Cr 1.5, BUN 18 (consistent with prior results in care everywhere)  EKG 12/15/16: Sinus rhythm with occasional PVCs. RBBB. LAFB. Bifascicular block. No prior EKG for comparison.   If no changes, I anticipate pt can proceed with surgery as scheduled.   Willeen Cass, FNP-BC [facility]/Anesthesiology Phone: [phone] 12/16/2016 4:07 PM

## 2016-12-16 NOTE — Anesthesia Preprocedure Evaluation (Addendum)
Anesthesia Evaluation  Patient identified by MRN, date of birth, ID band Patient awake    Reviewed: Allergy & Precautions, NPO status , Patient's Chart, lab work & pertinent test results  Airway Mallampati: II  TM Distance: >3 FB Neck ROM: Full    Dental  (+) Dental Advisory Given, Teeth Intact   Pulmonary neg pulmonary ROS,    Pulmonary exam normal breath sounds clear to auscultation       Cardiovascular Exercise Tolerance: Good hypertension, Normal cardiovascular exam Rhythm:Regular Rate:Normal  EKG - RBBB and LAFB   Neuro/Psych Anxiety negative neurological ROS     GI/Hepatic Neg liver ROS, Colon cancer   Endo/Other  Obesity, prediabetic  Renal/GU negative Renal ROS  negative genitourinary   Musculoskeletal  (+) Arthritis , Gout    Abdominal (+) + obese,   Peds  Hematology negative hematology ROS (+)   Anesthesia Other Findings   Reproductive/Obstetrics                           Anesthesia Physical Anesthesia Plan  ASA: II  Anesthesia Plan: General   Post-op Pain Management:    Induction: Intravenous  PONV Risk Score and Plan: 4 or greater and Ondansetron, Dexamethasone and Treatment may vary due to age or medical condition  Airway Management Planned: Oral ETT  Additional Equipment: None  Intra-op Plan:   Post-operative Plan: Extubation in OR  Informed Consent: I have reviewed the patients History and Physical, chart, labs and discussed the procedure including the risks, benefits and alternatives for the proposed anesthesia with the patient or authorized representative who has indicated his/her understanding and acceptance.   Dental advisory given and Dental Advisory Given  Plan Discussed with: CRNA  Anesthesia Plan Comments:        Anesthesia Quick Evaluation

## 2016-12-17 ENCOUNTER — Encounter (HOSPITAL_COMMUNITY)
Admission: RE | Disposition: A | Discharge status: Home / Self Care | Payer: Self-pay | Source: Ambulatory Visit | Attending: General Surgery

## 2016-12-17 ENCOUNTER — Inpatient Hospital Stay (HOSPITAL_COMMUNITY): Payer: Medicare Other | Admitting: Emergency Medicine

## 2016-12-17 ENCOUNTER — Encounter (HOSPITAL_COMMUNITY): Payer: Self-pay | Admitting: Surgery

## 2016-12-17 ENCOUNTER — Inpatient Hospital Stay (HOSPITAL_COMMUNITY)
Admission: RE | Admit: 2016-12-17 | Discharge: 2016-12-20 | DRG: 330 | Disposition: A | Discharge status: Home / Self Care | Payer: Medicare Other | Source: Ambulatory Visit | Attending: General Surgery | Admitting: General Surgery

## 2016-12-17 ENCOUNTER — Inpatient Hospital Stay (HOSPITAL_COMMUNITY): Payer: Medicare Other | Admitting: Anesthesiology

## 2016-12-17 DIAGNOSIS — Z6836 Body mass index (BMI) 36.0-36.9, adult: Secondary | ICD-10-CM

## 2016-12-17 DIAGNOSIS — E669 Obesity, unspecified: Secondary | ICD-10-CM | POA: Diagnosis present

## 2016-12-17 DIAGNOSIS — C187 Malignant neoplasm of sigmoid colon: Secondary | ICD-10-CM | POA: Diagnosis present

## 2016-12-17 DIAGNOSIS — M1990 Unspecified osteoarthritis, unspecified site: Secondary | ICD-10-CM | POA: Diagnosis present

## 2016-12-17 DIAGNOSIS — N183 Chronic kidney disease, stage 3 (moderate): Secondary | ICD-10-CM | POA: Diagnosis present

## 2016-12-17 DIAGNOSIS — K922 Gastrointestinal hemorrhage, unspecified: Secondary | ICD-10-CM | POA: Diagnosis present

## 2016-12-17 DIAGNOSIS — Z800 Family history of malignant neoplasm of digestive organs: Secondary | ICD-10-CM

## 2016-12-17 DIAGNOSIS — I129 Hypertensive chronic kidney disease with stage 1 through stage 4 chronic kidney disease, or unspecified chronic kidney disease: Secondary | ICD-10-CM | POA: Diagnosis present

## 2016-12-17 DIAGNOSIS — R7303 Prediabetes: Secondary | ICD-10-CM | POA: Diagnosis present

## 2016-12-17 HISTORY — PX: LAPAROSCOPIC SIGMOID COLECTOMY: SHX5928

## 2016-12-17 HISTORY — PX: COLECTOMY: SHX59

## 2016-12-17 HISTORY — DX: Gout, unspecified: M10.9

## 2016-12-17 HISTORY — DX: Personal history of urinary calculi: Z87.442

## 2016-12-17 HISTORY — DX: Prediabetes: R73.03

## 2016-12-17 HISTORY — DX: Malignant neoplasm of colon, unspecified: C18.9

## 2016-12-17 LAB — URINALYSIS, ROUTINE W REFLEX MICROSCOPIC
Bacteria, UA: NONE SEEN
Bilirubin Urine: NEGATIVE
Glucose, UA: NEGATIVE mg/dL
Hgb urine dipstick: NEGATIVE
Ketones, ur: NEGATIVE mg/dL
Nitrite: NEGATIVE
PH: 5 (ref 5.0–8.0)
Protein, ur: NEGATIVE mg/dL
SPECIFIC GRAVITY, URINE: 1.014 (ref 1.005–1.030)

## 2016-12-17 LAB — CBC
HCT: 38.6 % — ABNORMAL LOW (ref 39.0–52.0)
Hemoglobin: 12.6 g/dL — ABNORMAL LOW (ref 13.0–17.0)
MCH: 30.2 pg (ref 26.0–34.0)
MCHC: 32.6 g/dL (ref 30.0–36.0)
MCV: 92.6 fL (ref 78.0–100.0)
PLATELETS: 244 10*3/uL (ref 150–400)
RBC: 4.17 MIL/uL — ABNORMAL LOW (ref 4.22–5.81)
RDW: 13.7 % (ref 11.5–15.5)
WBC: 11.9 10*3/uL — ABNORMAL HIGH (ref 4.0–10.5)

## 2016-12-17 LAB — CREATININE, SERUM
CREATININE: 1.62 mg/dL — AB (ref 0.61–1.24)
GFR calc Af Amer: 46 mL/min — ABNORMAL LOW (ref >60)
GFR calc non Af Amer: 40 mL/min — ABNORMAL LOW (ref >60)

## 2016-12-17 SURGERY — COLECTOMY, SIGMOID, LAPAROSCOPIC
Anesthesia: General | Site: Abdomen

## 2016-12-17 MED ORDER — ACETAMINOPHEN 650 MG RE SUPP: 650.0000 mg | Freq: Four times a day (QID) | RECTAL | Status: DC | PRN

## 2016-12-17 MED ORDER — FENTANYL CITRATE (PF) 250 MCG/5ML IJ SOLN
INTRAMUSCULAR | Status: AC
  Filled 2016-12-17: qty 5

## 2016-12-17 MED ORDER — FENTANYL CITRATE (PF) 100 MCG/2ML IJ SOLN
INTRAMUSCULAR | Status: DC | PRN
  Administered 2016-12-17: 100 ug via INTRAVENOUS
  Administered 2016-12-17: 50 ug via INTRAVENOUS

## 2016-12-17 MED ORDER — SODIUM CHLORIDE 0.9% FLUSH: 9.0000 mL | INTRAVENOUS | Status: DC | PRN

## 2016-12-17 MED ORDER — 0.9 % SODIUM CHLORIDE (POUR BTL) OPTIME
TOPICAL | Status: DC | PRN
  Administered 2016-12-17: 1000 mL

## 2016-12-17 MED ORDER — ACETAMINOPHEN 500 MG PO TABS
1000.0000 mg | ORAL_TABLET | ORAL | Status: AC
  Administered 2016-12-17: 1000 mg via ORAL

## 2016-12-17 MED ORDER — ALVIMOPAN 12 MG PO CAPS
12.0000 mg | ORAL_CAPSULE | ORAL | Status: AC
  Administered 2016-12-17: 12 mg via ORAL

## 2016-12-17 MED ORDER — LIDOCAINE HCL (PF) 1 % IJ SOLN
INTRAMUSCULAR | Status: AC
  Filled 2016-12-17: qty 30

## 2016-12-17 MED ORDER — DIPHENHYDRAMINE HCL 50 MG/ML IJ SOLN: 12.5000 mg | Freq: Four times a day (QID) | INTRAMUSCULAR | Status: DC | PRN

## 2016-12-17 MED ORDER — ONDANSETRON HCL 4 MG/2ML IJ SOLN: 4.0000 mg | Freq: Four times a day (QID) | INTRAMUSCULAR | Status: DC | PRN

## 2016-12-17 MED ORDER — BUPIVACAINE-EPINEPHRINE (PF) 0.25% -1:200000 IJ SOLN
INTRAMUSCULAR | Status: AC
  Filled 2016-12-17: qty 30

## 2016-12-17 MED ORDER — LIDOCAINE 2% (20 MG/ML) 5 ML SYRINGE
INTRAMUSCULAR | Status: AC
  Filled 2016-12-17: qty 10

## 2016-12-17 MED ORDER — CEFOTETAN DISODIUM-DEXTROSE 2-2.08 GM-%(50ML) IV SOLR
2.0000 g | INTRAVENOUS | Status: AC
  Administered 2016-12-17: 2 g via INTRAVENOUS

## 2016-12-17 MED ORDER — ONDANSETRON 4 MG PO TBDP: 4.0000 mg | ORAL_TABLET | Freq: Four times a day (QID) | ORAL | Status: DC | PRN

## 2016-12-17 MED ORDER — DEXAMETHASONE SODIUM PHOSPHATE 10 MG/ML IJ SOLN
INTRAMUSCULAR | Status: DC | PRN
  Administered 2016-12-17: 10 mg via INTRAVENOUS

## 2016-12-17 MED ORDER — ALVIMOPAN 12 MG PO CAPS: 12.0000 mg | ORAL_CAPSULE | Freq: Two times a day (BID) | ORAL | Status: DC

## 2016-12-17 MED ORDER — PROPOFOL 10 MG/ML IV BOLUS
INTRAVENOUS | Status: AC
  Filled 2016-12-17: qty 20

## 2016-12-17 MED ORDER — ROCURONIUM BROMIDE 100 MG/10ML IV SOLN
INTRAVENOUS | Status: DC | PRN
  Administered 2016-12-17: 20 mg via INTRAVENOUS
  Administered 2016-12-17: 60 mg via INTRAVENOUS

## 2016-12-17 MED ORDER — MIDAZOLAM HCL 2 MG/2ML IJ SOLN
INTRAMUSCULAR | Status: AC
Start: 2016-12-17 — End: ?
  Filled 2016-12-17: qty 2

## 2016-12-17 MED ORDER — LIDOCAINE HCL 1 % IJ SOLN
INTRAMUSCULAR | Status: DC | PRN
  Administered 2016-12-17: 15 mL

## 2016-12-17 MED ORDER — ACETAMINOPHEN 500 MG PO TABS
ORAL_TABLET | ORAL | Status: AC
  Administered 2016-12-17: 1000 mg via ORAL
  Filled 2016-12-17: qty 2

## 2016-12-17 MED ORDER — ACETAMINOPHEN 500 MG PO TABS
1000.0000 mg | ORAL_TABLET | Freq: Four times a day (QID) | ORAL | Status: DC
  Administered 2016-12-17 – 2016-12-18 (×2): 1000 mg via ORAL
  Filled 2016-12-17 (×5): qty 2

## 2016-12-17 MED ORDER — DICLOFENAC SODIUM 75 MG PO TBEC
75.0000 mg | DELAYED_RELEASE_TABLET | Freq: Every day | ORAL | Status: DC | PRN
  Filled 2016-12-17: qty 1

## 2016-12-17 MED ORDER — LACTATED RINGERS IV SOLN: INTRAVENOUS | Status: DC

## 2016-12-17 MED ORDER — SUGAMMADEX SODIUM 200 MG/2ML IV SOLN
INTRAVENOUS | Status: AC
  Filled 2016-12-17: qty 6

## 2016-12-17 MED ORDER — LORAZEPAM 1 MG PO TABS: 1.0000 mg | ORAL_TABLET | Freq: Every day | ORAL | Status: DC | PRN

## 2016-12-17 MED ORDER — ACETAMINOPHEN 325 MG PO TABS: 650.0000 mg | ORAL_TABLET | Freq: Four times a day (QID) | ORAL | Status: DC | PRN

## 2016-12-17 MED ORDER — SUGAMMADEX SODIUM 200 MG/2ML IV SOLN
INTRAVENOUS | Status: DC | PRN
  Administered 2016-12-17: 400 mg via INTRAVENOUS

## 2016-12-17 MED ORDER — MORPHINE SULFATE (PF) 4 MG/ML IV SOLN: 1.0000 mg | INTRAVENOUS | Status: DC | PRN

## 2016-12-17 MED ORDER — SUGAMMADEX SODIUM 500 MG/5ML IV SOLN
INTRAVENOUS | Status: AC
  Filled 2016-12-17: qty 5

## 2016-12-17 MED ORDER — SODIUM CHLORIDE 0.9 % IV SOLN
INTRAVENOUS | Status: DC
  Filled 2016-12-17: qty 6

## 2016-12-17 MED ORDER — NALOXONE HCL 0.4 MG/ML IJ SOLN
0.4000 mg | INTRAMUSCULAR | Status: DC | PRN
Start: 2016-12-17 — End: 2016-12-18

## 2016-12-17 MED ORDER — BENAZEPRIL-HYDROCHLOROTHIAZIDE 20-12.5 MG PO TABS: 1.0000 | ORAL_TABLET | Freq: Every day | ORAL | Status: DC

## 2016-12-17 MED ORDER — ROCURONIUM BROMIDE 50 MG/5ML IV SOLN
INTRAVENOUS | Status: AC
  Filled 2016-12-17: qty 4

## 2016-12-17 MED ORDER — GABAPENTIN 300 MG PO CAPS
ORAL_CAPSULE | ORAL | Status: AC
  Administered 2016-12-17: 300 mg via ORAL
  Filled 2016-12-17: qty 1

## 2016-12-17 MED ORDER — ONDANSETRON HCL 4 MG/2ML IJ SOLN
INTRAMUSCULAR | Status: DC | PRN
  Administered 2016-12-17: 4 mg via INTRAVENOUS

## 2016-12-17 MED ORDER — METHOCARBAMOL 500 MG PO TABS: 500.0000 mg | ORAL_TABLET | Freq: Four times a day (QID) | ORAL | Status: DC | PRN

## 2016-12-17 MED ORDER — DIPHENHYDRAMINE HCL 12.5 MG/5ML PO ELIX: 12.5000 mg | ORAL_SOLUTION | Freq: Four times a day (QID) | ORAL | Status: DC | PRN

## 2016-12-17 MED ORDER — BENAZEPRIL HCL 20 MG PO TABS
20.0000 mg | ORAL_TABLET | Freq: Every day | ORAL | Status: DC
Start: 2016-12-17 — End: 2016-12-20
  Administered 2016-12-18 – 2016-12-19 (×2): 20 mg via ORAL
  Filled 2016-12-17 (×4): qty 1

## 2016-12-17 MED ORDER — SODIUM CHLORIDE 0.9 % IR SOLN
Status: DC | PRN
  Administered 2016-12-17: 1

## 2016-12-17 MED ORDER — CEFOTETAN DISODIUM 2 G IJ SOLR
2.0000 g | Freq: Two times a day (BID) | INTRAMUSCULAR | Status: AC
  Administered 2016-12-17: 2 g via INTRAVENOUS
  Filled 2016-12-17: qty 2

## 2016-12-17 MED ORDER — HYDRALAZINE HCL 20 MG/ML IJ SOLN
10.0000 mg | INTRAMUSCULAR | Status: DC | PRN
  Filled 2016-12-17: qty 1

## 2016-12-17 MED ORDER — PHENYLEPHRINE HCL 10 MG/ML IJ SOLN
INTRAMUSCULAR | Status: DC | PRN
  Administered 2016-12-17: 50 ug/min via INTRAVENOUS

## 2016-12-17 MED ORDER — LACTATED RINGERS IV SOLN
INTRAVENOUS | Status: DC | PRN
  Administered 2016-12-17 (×2): via INTRAVENOUS

## 2016-12-17 MED ORDER — BUPIVACAINE 0.25 % ON-Q PUMP DUAL CATH 300 ML
300.0000 mL | INJECTION | Status: DC
  Filled 2016-12-17: qty 300

## 2016-12-17 MED ORDER — BUPIVACAINE ON-Q PAIN PUMP (FOR ORDER SET NO CHG): INJECTION | Status: DC

## 2016-12-17 MED ORDER — PROPOFOL 10 MG/ML IV BOLUS
INTRAVENOUS | Status: DC | PRN
  Administered 2016-12-17: 200 mg via INTRAVENOUS

## 2016-12-17 MED ORDER — ONDANSETRON HCL 4 MG/2ML IJ SOLN
4.0000 mg | Freq: Once | INTRAMUSCULAR | Status: DC | PRN
Start: 2016-12-17 — End: 2016-12-17

## 2016-12-17 MED ORDER — GABAPENTIN 300 MG PO CAPS
300.0000 mg | ORAL_CAPSULE | ORAL | Status: AC
  Administered 2016-12-17: 300 mg via ORAL

## 2016-12-17 MED ORDER — DEXAMETHASONE SODIUM PHOSPHATE 10 MG/ML IJ SOLN
INTRAMUSCULAR | Status: AC
  Filled 2016-12-17: qty 2

## 2016-12-17 MED ORDER — KCL IN DEXTROSE-NACL 20-5-0.45 MEQ/L-%-% IV SOLN
INTRAVENOUS | Status: AC
  Administered 2016-12-17 – 2016-12-19 (×5): via INTRAVENOUS
  Filled 2016-12-17 (×5): qty 1000

## 2016-12-17 MED ORDER — ENOXAPARIN SODIUM 40 MG/0.4ML ~~LOC~~ SOLN
40.0000 mg | SUBCUTANEOUS | Status: DC
  Administered 2016-12-18 – 2016-12-20 (×3): 40 mg via SUBCUTANEOUS
  Filled 2016-12-17 (×3): qty 0.4

## 2016-12-17 MED ORDER — PHENYLEPHRINE 40 MCG/ML (10ML) SYRINGE FOR IV PUSH (FOR BLOOD PRESSURE SUPPORT)
PREFILLED_SYRINGE | INTRAVENOUS | Status: AC
  Filled 2016-12-17: qty 10

## 2016-12-17 MED ORDER — SUCCINYLCHOLINE CHLORIDE 200 MG/10ML IV SOSY
PREFILLED_SYRINGE | INTRAVENOUS | Status: AC
  Filled 2016-12-17: qty 10

## 2016-12-17 MED ORDER — SIMETHICONE 80 MG PO CHEW: 40.0000 mg | CHEWABLE_TABLET | Freq: Four times a day (QID) | ORAL | Status: DC | PRN

## 2016-12-17 MED ORDER — MIDAZOLAM HCL 5 MG/5ML IJ SOLN
INTRAMUSCULAR | Status: DC | PRN
  Administered 2016-12-17: 1 mg via INTRAVENOUS

## 2016-12-17 MED ORDER — OXYCODONE HCL 5 MG PO TABS
5.0000 mg | ORAL_TABLET | ORAL | Status: DC | PRN
  Filled 2016-12-17: qty 1

## 2016-12-17 MED ORDER — HYDROCHLOROTHIAZIDE 12.5 MG PO CAPS
12.5000 mg | ORAL_CAPSULE | Freq: Every day | ORAL | Status: DC
  Administered 2016-12-17 – 2016-12-20 (×2): 12.5 mg via ORAL
  Filled 2016-12-17 (×4): qty 1

## 2016-12-17 MED ORDER — LIDOCAINE HCL (CARDIAC) 20 MG/ML IV SOLN
INTRAVENOUS | Status: DC | PRN
  Administered 2016-12-17: 80 mg via INTRAVENOUS

## 2016-12-17 MED ORDER — ONDANSETRON HCL 4 MG/2ML IJ SOLN
INTRAMUSCULAR | Status: AC
  Filled 2016-12-17: qty 4

## 2016-12-17 MED ORDER — CEFOTETAN DISODIUM-DEXTROSE 2-2.08 GM-%(50ML) IV SOLR
INTRAVENOUS | Status: AC
  Filled 2016-12-17: qty 50

## 2016-12-17 MED ORDER — MORPHINE SULFATE 2 MG/ML IV SOLN
INTRAVENOUS | Status: DC
  Administered 2016-12-17: 13:00:00 via INTRAVENOUS
  Administered 2016-12-17: 2 mg via INTRAVENOUS
  Administered 2016-12-17: 3 mg via INTRAVENOUS
  Administered 2016-12-18: 1 mg via INTRAVENOUS
  Administered 2016-12-18 (×2): 0 mg via INTRAVENOUS
  Filled 2016-12-17: qty 30

## 2016-12-17 MED ORDER — ALVIMOPAN 12 MG PO CAPS
ORAL_CAPSULE | ORAL | Status: AC
  Administered 2016-12-17: 12 mg via ORAL
  Filled 2016-12-17: qty 1

## 2016-12-17 MED ORDER — TRAMADOL HCL 50 MG PO TABS: 50.0000 mg | ORAL_TABLET | Freq: Four times a day (QID) | ORAL | Status: DC | PRN

## 2016-12-17 MED ORDER — FENTANYL CITRATE (PF) 100 MCG/2ML IJ SOLN: 25.0000 ug | INTRAMUSCULAR | Status: DC | PRN

## 2016-12-17 MED ORDER — GABAPENTIN 300 MG PO CAPS
300.0000 mg | ORAL_CAPSULE | Freq: Two times a day (BID) | ORAL | Status: DC
  Administered 2016-12-17 – 2016-12-20 (×6): 300 mg via ORAL
  Filled 2016-12-17 (×6): qty 1

## 2016-12-17 SURGICAL SUPPLY — 72 items
APPLIER CLIP ROT 10 11.4 M/L (STAPLE)
BLADE CLIPPER SURG (BLADE) ×2 IMPLANT
CANISTER SUCT 3000ML PPV (MISCELLANEOUS) ×2 IMPLANT
CELLS DAT CNTRL 66122 CELL SVR (MISCELLANEOUS) IMPLANT
CHLORAPREP W/TINT 26ML (MISCELLANEOUS) ×2 IMPLANT
CLIP APPLIE ROT 10 11.4 M/L (STAPLE) IMPLANT
COVER MAYO STAND STRL (DRAPES) ×4 IMPLANT
COVER SURGICAL LIGHT HANDLE (MISCELLANEOUS) ×4 IMPLANT
DRAPE HALF SHEET 40X57 (DRAPES) ×2 IMPLANT
DRAPE UTILITY XL STRL (DRAPES) ×2 IMPLANT
DRAPE WARM FLUID 44X44 (DRAPE) ×2 IMPLANT
DRSG OPSITE POSTOP 4X10 (GAUZE/BANDAGES/DRESSINGS) IMPLANT
DRSG OPSITE POSTOP 4X6 (GAUZE/BANDAGES/DRESSINGS) ×2 IMPLANT
DRSG OPSITE POSTOP 4X8 (GAUZE/BANDAGES/DRESSINGS) IMPLANT
DRSG TEGADERM 4X4.75 (GAUZE/BANDAGES/DRESSINGS) ×4 IMPLANT
ELECT BLADE 6.5 EXT (BLADE) ×2 IMPLANT
ELECT CAUTERY BLADE 6.4 (BLADE) ×4 IMPLANT
ELECT REM PT RETURN 9FT ADLT (ELECTROSURGICAL) ×2
ELECTRODE REM PT RTRN 9FT ADLT (ELECTROSURGICAL) ×1 IMPLANT
GEL ULTRASOUND 20GR AQUASONIC (MISCELLANEOUS) ×2 IMPLANT
GLOVE BIO SURGEON STRL SZ 6 (GLOVE) ×4 IMPLANT
GLOVE BIOGEL PI IND STRL 6.5 (GLOVE) ×2 IMPLANT
GLOVE BIOGEL PI INDICATOR 6.5 (GLOVE) ×2
GOWN STRL REUS W/ TWL LRG LVL3 (GOWN DISPOSABLE) ×4 IMPLANT
GOWN STRL REUS W/TWL 2XL LVL3 (GOWN DISPOSABLE) ×4 IMPLANT
GOWN STRL REUS W/TWL LRG LVL3 (GOWN DISPOSABLE) ×4
KIT BASIN OR (CUSTOM PROCEDURE TRAY) ×2 IMPLANT
KIT ROOM TURNOVER OR (KITS) ×2 IMPLANT
L-HOOK LAP DISP 36CM (ELECTROSURGICAL) ×2
LEGGING LITHOTOMY PAIR STRL (DRAPES) ×2 IMPLANT
LHOOK LAP DISP 36CM (ELECTROSURGICAL) ×1 IMPLANT
LIGASURE IMPACT 36 18CM CVD LR (INSTRUMENTS) IMPLANT
LIGASURE MARYLAND LAP STAND (ELECTROSURGICAL) IMPLANT
NS IRRIG 1000ML POUR BTL (IV SOLUTION) ×4 IMPLANT
PAD ARMBOARD 7.5X6 YLW CONV (MISCELLANEOUS) ×2 IMPLANT
PENCIL BUTTON HOLSTER BLD 10FT (ELECTRODE) ×4 IMPLANT
RTRCTR WOUND ALEXIS 18CM MED (MISCELLANEOUS)
SCISSORS LAP 5X35 DISP (ENDOMECHANICALS) ×2 IMPLANT
SET IRRIG TUBING LAPAROSCOPIC (IRRIGATION / IRRIGATOR) ×2 IMPLANT
SHEARS HARMONIC ACE PLUS 36CM (ENDOMECHANICALS) IMPLANT
SLEEVE ENDOPATH XCEL 5M (ENDOMECHANICALS) ×2 IMPLANT
SPECIMEN JAR LARGE (MISCELLANEOUS) ×2 IMPLANT
SPONGE LAP 18X18 X RAY DECT (DISPOSABLE) ×2 IMPLANT
STAPLER VISISTAT 35W (STAPLE) ×2 IMPLANT
STRIP CLOSURE SKIN 1/2X4 (GAUZE/BANDAGES/DRESSINGS) ×2 IMPLANT
SUCTION POOLE TIP (SUCTIONS) ×2 IMPLANT
SURGILUBE 2OZ TUBE FLIPTOP (MISCELLANEOUS) ×2 IMPLANT
SUT MNCRL AB 4-0 PS2 18 (SUTURE) ×4 IMPLANT
SUT PDS AB 1 CT  36 (SUTURE) ×2
SUT PDS AB 1 CT 36 (SUTURE) ×2 IMPLANT
SUT PROLENE 0 SH 30 (SUTURE) ×2 IMPLANT
SUT PROLENE 2 0 CT2 30 (SUTURE) ×2 IMPLANT
SUT PROLENE 2 0 KS (SUTURE) IMPLANT
SUT VIC AB 2-0 SH 18 (SUTURE) ×2 IMPLANT
SUT VIC AB 3-0 SH 18 (SUTURE) ×2 IMPLANT
SUT VICRYL AB 2 0 TIES (SUTURE) ×2 IMPLANT
SUT VICRYL AB 3 0 TIES (SUTURE) ×2 IMPLANT
SYR BULB IRRIGATION 50ML (SYRINGE) ×2 IMPLANT
SYS LAPSCP GELPORT 120MM (MISCELLANEOUS) ×2
SYSTEM LAPSCP GELPORT 120MM (MISCELLANEOUS) ×1 IMPLANT
TOWEL OR 17X26 10 PK STRL BLUE (TOWEL DISPOSABLE) ×4 IMPLANT
TRAY FOLEY W/METER SILVER 16FR (SET/KITS/TRAYS/PACK) ×2 IMPLANT
TRAY LAPAROSCOPIC MC (CUSTOM PROCEDURE TRAY) ×2 IMPLANT
TRAY PROCTOSCOPIC FIBER OPTIC (SET/KITS/TRAYS/PACK) ×2 IMPLANT
TROCAR XCEL 12X100 BLDLESS (ENDOMECHANICALS) IMPLANT
TROCAR XCEL BLUNT TIP 100MML (ENDOMECHANICALS) IMPLANT
TROCAR XCEL NON-BLD 11X100MML (ENDOMECHANICALS) IMPLANT
TROCAR XCEL NON-BLD 5MMX100MML (ENDOMECHANICALS) ×2 IMPLANT
TUBE CONNECTING 12X1/4 (SUCTIONS) ×4 IMPLANT
TUBING INSUF HEATED (TUBING) ×2 IMPLANT
TUBING INSUFFLATION (TUBING) IMPLANT
YANKAUER SUCT BULB TIP NO VENT (SUCTIONS) ×4 IMPLANT

## 2016-12-17 NOTE — Transfer of Care (Signed)
Immediate Anesthesia Transfer of Care Note  Patient: Edwin Wall  Procedure(s) Performed: LAPAROSCOPIC SIGMOID COLECTOMY ([name] Abdomen)  Patient Location: PACU  Anesthesia Type:General  Level of Consciousness: awake, alert  and oriented  Airway & Oxygen Therapy: Patient Spontanous Breathing and Patient connected to face mask oxygen  Post-op Assessment: Report given to RN, Post -op Vital signs reviewed and stable and Patient moving all extremities X 4  Post vital signs: Reviewed and stable  Last Vitals:  Vitals:   12/17/16 0640 12/17/16 1141  BP: 116/69   Pulse: 72   Resp: 20   Temp: 36.7 C (!) (P) 36.4 C  SpO2: 98%     Last Pain:  Vitals:   12/17/16 1141  TempSrc:   PainSc: (P) Asleep      Patients Stated Pain Goal: 3 (32/91/91 6606)  Complications: No apparent anesthesia complications

## 2016-12-17 NOTE — Interval H&P Note (Signed)
History and Physical Interval Note:  12/17/2016 8:36 AM  Edwin Wall  has presented today for surgery, with the diagnosis of Sigmoid Colon Cancer  The various methods of treatment have been discussed with the patient and family. After consideration of risks, benefits and other options for treatment, the patient has consented to  Procedure(s): LAPAROSCOPIC SIGMOID COLECTOMY ([name]) as a surgical intervention .  The patient's history has been reviewed, patient examined, no change in status, stable for surgery.  I have reviewed the patient's chart and labs.  Questions were answered to the patient's satisfaction.     Vinay Ertl

## 2016-12-17 NOTE — Op Note (Addendum)
Laparoscopic Sigmoid Colectomy  Indications: This patient presents for a laparoscopic partial colectomy for sigmoid colon cancer diagnosed on colonoscopy performed for GI bleeding  Pre-operative Diagnosis: sigmoid colon cancer  Post-operative Diagnosis: Same  Surgeon: [name]   Assistants: Lorine Bears, MD  Anesthesia: General endotracheal anesthesia and Local anesthesia 1% plain lidocaine, 0.5% bupivacaine, with epinephrine  ASA Class: 2  Procedure Details  The patient was seen in the Holding Room. The risks, benefits, complications, treatment options, and expected outcomes were discussed with the patient. The possibilities of reaction to medication, perforation of viscus, bleeding, recurrent infection, finding a normal colon, the need for additional procedures, failure to diagnose a condition, and creating a complication requiring transfusion or operation were discussed with the patient. The patient was advised of the risk of ostomy.  The patient concurred with the proposed plan, giving informed consent.   The patient was taken to the operating room, identified, and the procedure verified as laparoscopic sigmoid colectomy. A Time Out was held and the above information confirmed.  The patient was brought to the operating room and placed into low lithotomy position. After induction of a general anesthetic, a Foley catheter was inserted and the abdomen was prepped and draped in standard fashion. The patient was then placed into reverse trendelenburg position and rotated to the right. A 5 mm Optiview trocar was placed at the left costal margin under direct visualization. Pneumoperitoneum was insufflated to a pressure of 15 mm Hg. The laparoscope was introduced.    Exploration revealed a normal omentum, small bowel, peritoneum, liver, and stomach. Two right sided 5-mm trocars were then placed after anesthetizing the skin and peritoneum with Marcaine. There were no adhesions.  An additional 5 mm  trocar was placed in the upper midline.  The tattoo was seen in the distal sigmoid colon.     The descending colon was mobilized with gentle retraction of the colon in a medial direction with mobilization of the peritoneal reflection with cautery and the EnSeal. Mobilization of this area was complete to expose the retroperitoneum. The left colon was able to mobilize downward.  The left ureter was identified and preserved.  High ligation was performed on the IMA with the Enseal.     After completing mobilization, the lower midline was opened with a #10 blade for the colon extraction port.  The colon was divided at the proximal sigmoid with the cautery.  The mesocolon was divided with the Enseal.  Once the sigmoid was fully mobilized, the proximal rectum was divided with the contour stapler.  The specimen was submitted to pathology.      An end-to-end anastomosis was performed with the EEA stapling device. The proximal end was opened and the sizers used to determine which EEA to use.  The 33 mm stapler was selected.  A 0-0 prolene pursestring suture was placed around the anvil.  The length of the colon was good with no tension.  The stapler was placed via the anus, the spike deployed, and the stapler coupled without difficulty.   The colon ends were pulled together with the stapler.  Pressure was held for 1 minute.  The stapler was fired and pressure held for another 30 seconds.  The stapler was removed.  The anastamotic rings were robust and complete.  The proctoscope was used to insufflate the rectum to test the anastamosis.  No leak was seen.  The anastamosis was at approximately 15 cm.  No bleeding was seen.    The colon protocol was  followed.  The abdomen was irrigated with antibiotic irrigation. The peritoneum of the lower midline incision was closed with 0-0 vicryl.  The fascia was closed with running #1 PDS suture.  The skin was irrigated.  OnQ catheters were placed in the preperitoneal space.  The  skin of the lower midline extraction port was closed with staples.  The port sites were closed with 4-0 monocryl interrupted sutures and dermabond.  The midline wound was dressed with a honeycomb dressing.    Instrument, sponge, and needle counts were correct prior to abdominal closure and at the conclusion of the case.   Findings: inflamed distal sigmoid adhesed to itself.  Evidence of prior abscess seen at aortic bifurcation.    Estimated Blood Loss: less than 50 mL             Specimens: sigmoid colon and anastamotic rings.         Complications: None; patient tolerated the procedure well.         Disposition: PACU - hemodynamically stable.         Condition: stable

## 2016-12-17 NOTE — Anesthesia Postprocedure Evaluation (Signed)
Anesthesia Post Note  Patient: Sisto Granillo Howeth  Procedure(s) Performed: LAPAROSCOPIC SIGMOID COLECTOMY ([name] Abdomen)     Patient location during evaluation: PACU Anesthesia Type: General Level of consciousness: awake and alert Pain management: pain level controlled Vital Signs Assessment: post-procedure vital signs reviewed and stable Respiratory status: spontaneous breathing, nonlabored ventilation, respiratory function stable and patient connected to nasal cannula oxygen Cardiovascular status: blood pressure returned to baseline and stable Postop Assessment: no apparent nausea or vomiting Anesthetic complications: no    Last Vitals:  Vitals:   12/17/16 1226 12/17/16 1300  BP: 101/60   Pulse: 67 67  Resp: 19 16  Temp:  (!) 36.1 C  SpO2: 98% 99%    Last Pain:  Vitals:   12/17/16 1210  TempSrc:   PainSc: 0-No pain                 Audry Pili

## 2016-12-17 NOTE — Discharge Instructions (Signed)
[facility] °[phone] ° °ABDOMINAL SURGERY: POST OP INSTRUCTIONS ° °Always review your discharge instruction sheet given to you by the facility where your surgery was performed. ° °IF YOU HAVE DISABILITY OR FAMILY LEAVE FORMS, YOU MUST BRING THEM TO THE OFFICE FOR PROCESSING.  PLEASE DO NOT GIVE THEM TO YOUR DOCTOR. ° °1. A prescription for pain medication may be given to you upon discharge.  Take your pain medication as prescribed, if needed.  If narcotic pain medicine is not needed, then you may take acetaminophen (Tylenol) or ibuprofen (Advil) as needed. °2. Take your usually prescribed medications unless otherwise directed. °3. If you need a refill on your pain medication, please contact your pharmacy. They will contact our office to request authorization.  Prescriptions will not be filled after 5pm or on week-ends. °4. You should follow a light diet the first few days after arrival home, such as soup and crackers, pudding, etc.unless your doctor has advised otherwise. A high-fiber, low fat diet can be resumed as tolerated.   Be sure to include lots of fluids daily. Most patients will experience some swelling and bruising on the chest and neck area.  Ice packs will help.  Swelling and bruising can take several days to resolve °5. Most patients will experience some swelling and bruising in the area of the incision. Ice pack will help. Swelling and bruising can take several days to resolve..  °6. It is common to experience some constipation if taking pain medication after surgery.  Increasing fluid intake and taking a stool softener will usually help or prevent this problem from occurring.  A mild laxative (Milk of Magnesia or Miralax) should be taken according to package directions if there are no bowel movements after 48 hours. °7.  You may have steri-strips (small skin tapes) in place directly over the incision.  These strips should be left on the skin for 10-14 days.  If your  surgeon used skin glue on the incision, you may shower in 48 hours.  The glue will flake off over the next 2-3 weeks.  Any sutures or staples will be removed at the office during your follow-up visit. You may find that a light gauze bandage over your incision may keep your staples from being rubbed or pulled. You may shower and replace the bandage daily. °8. ACTIVITIES:  You may resume regular (light) daily activities beginning the next day--such as daily self-care, walking, climbing stairs--gradually increasing activities as tolerated.  You may have sexual intercourse when it is comfortable.  Refrain from any heavy lifting or straining until approved by your doctor. °a. You may drive when you no longer are taking prescription pain medication, you can comfortably wear a seatbelt, and you can safely maneuver your car and apply brakes °b. Return to Work: __________8 weeks if applicable_________________________ °9. You should see your doctor in the office for a follow-up appointment approximately two weeks after your surgery.  Make sure that you call for this appointment within a day or two after you arrive home to insure a convenient appointment time. °OTHER INSTRUCTIONS:  °_____________________________________________________________ °_____________________________________________________________ ° °WHEN TO CALL YOUR DOCTOR: °1. Fever over 101.0 °2. Inability to urinate °3. Nausea and/or vomiting °4. Extreme swelling or bruising °5. Continued bleeding from incision. °6. Increased pain, redness, or drainage from the incision. °7. Difficulty swallowing or breathing °8. Muscle cramping or spasms. °9. Numbness or tingling in hands or feet or around lips. ° °The clinic staff is   available to answer your questions during regular business hours.  Please don’t hesitate to call and ask to speak to one of the nurses if you have concerns. ° °For further questions, please visit [email or web address] ° ° ° °

## 2016-12-17 NOTE — Anesthesia Procedure Notes (Signed)
Procedure Name: Intubation Date/Time: 12/17/2016 8:53 AM Performed by: Neldon Newport Pre-anesthesia Checklist: Timeout performed, Patient being monitored, Suction available, Emergency Drugs available and Patient identified Patient Re-evaluated:Patient Re-evaluated prior to induction Oxygen Delivery Method: Circle system utilized Preoxygenation: Pre-oxygenation with 100% oxygen Induction Type: IV induction Ventilation: Mask ventilation without difficulty and Oral airway inserted - appropriate to patient size Laryngoscope Size: Mac and 4 Grade View: Grade I Tube type: Oral Tube size: 7.5 mm Number of attempts: 1 Placement Confirmation: ETT inserted through vocal cords under direct vision,  positive ETCO2 and breath sounds checked- equal and bilateral Secured at: 22 cm Tube secured with: Tape Dental Injury: Teeth and Oropharynx as per pre-operative assessment

## 2016-12-18 ENCOUNTER — Encounter (HOSPITAL_COMMUNITY): Payer: Self-pay | Admitting: General Surgery

## 2016-12-18 LAB — CBC
HEMATOCRIT: 36 % — AB (ref 39.0–52.0)
HEMOGLOBIN: 11.3 g/dL — AB (ref 13.0–17.0)
MCH: 29.1 pg (ref 26.0–34.0)
MCHC: 31.4 g/dL (ref 30.0–36.0)
MCV: 92.8 fL (ref 78.0–100.0)
Platelets: 256 10*3/uL (ref 150–400)
RBC: 3.88 MIL/uL — AB (ref 4.22–5.81)
RDW: 13.6 % (ref 11.5–15.5)
WBC: 14.1 10*3/uL — ABNORMAL HIGH (ref 4.0–10.5)

## 2016-12-18 LAB — BASIC METABOLIC PANEL
Anion gap: 9 (ref 5–15)
BUN: 17 mg/dL (ref 6–20)
CHLORIDE: 104 mmol/L (ref 101–111)
CO2: 24 mmol/L (ref 22–32)
Calcium: 8.6 mg/dL — ABNORMAL LOW (ref 8.9–10.3)
Creatinine, Ser: 1.62 mg/dL — ABNORMAL HIGH (ref 0.61–1.24)
GFR calc Af Amer: 46 mL/min — ABNORMAL LOW (ref >60)
GFR calc non Af Amer: 40 mL/min — ABNORMAL LOW (ref >60)
GLUCOSE: 144 mg/dL — AB (ref 65–99)
POTASSIUM: 4.8 mmol/L (ref 3.5–5.1)
SODIUM: 137 mmol/L (ref 135–145)

## 2016-12-18 MED ORDER — MORPHINE SULFATE (PF) 4 MG/ML IV SOLN: 1.0000 mg | INTRAVENOUS | Status: DC | PRN

## 2016-12-18 NOTE — Progress Notes (Addendum)
1 Day Post-Op   Subjective/Chief Complaint: No n/v.  Minimal analgesic needs overnight. Having BM now. Has been passing flatus.     Objective: Vital signs in last 24 hours: Temp:  [97 F (36.1 C)-99 F (37.2 C)] 98.1 F (36.7 C) (10/18 0434) Pulse Rate:  [65-89] 80 (10/18 0434) Resp:  [14-25] 22 (10/18 0740) BP: (96-111)/(57-71) 107/58 (10/18 0434) SpO2:  [95 %-100 %] 97 % (10/18 0740) Last BM Date: 12/17/16  Intake/Output from previous day: 10/17 0701 - 10/18 0700 In: 3642.7 [P.O.:180; I.V.:3412.7; IV Piggyback:50] Out: 2135 [Urine:2035; Blood:100] Intake/Output this shift: No intake/output data recorded.  General appearance: alert, cooperative and no distress Resp: breathing comfortably Cardio: regular rate and rhythm GI: soft, approp tender.  dressings c/d/i. Non distended Extremities: extremities normal, atraumatic, no cyanosis or edema  Lab Results:   Recent Labs  12/17/16 1627 12/18/16 0520  WBC 11.9* 14.1*  HGB 12.6* 11.3*  HCT 38.6* 36.0*  PLT 244 256   BMET  Recent Labs  12/15/16 1113 12/17/16 1627 12/18/16 0520  NA 138  --  137  K 3.4*  --  4.8  CL 105  --  104  CO2 24  --  24  GLUCOSE 134*  --  144*  BUN 18  --  17  CREATININE 1.50* 1.62* 1.62*  CALCIUM 9.1  --  8.6*   PT/INR  Recent Labs  12/15/16 1113  LABPROT 12.9  INR 0.98   ABG No results for input(s): PHART, HCO3 in the last 72 hours.  Invalid input(s): PCO2, PO2  Studies/Results: No results found.  Anti-infectives: Anti-infectives    Start     Dose/Rate Route Frequency Ordered Stop   12/17/16 2000  cefoTEtan (CEFOTAN) 2 g in dextrose 5 % 50 mL IVPB     2 g 100 mL/hr over 30 Minutes Intravenous Every 12 hours 12/17/16 1314 12/17/16 2030   12/17/16 0648  cefoTEtan in Dextrose 5% (CEFOTAN) 2-2.08 GM-% IVPB    Comments:  Laurita Quint   : cabinet override      12/17/16 4503 12/17/16 1859   12/17/16 0645  clindamycin (CLEOCIN) 900 mg, gentamicin (GARAMYCIN) 240 mg in  sodium chloride 0.9 % 1,000 mL for intraperitoneal lavage  Status:  Discontinued      Intraperitoneal To Surgery 12/17/16 8882 12/17/16 1313   12/17/16 0642  cefoTEtan in Dextrose 5% (CEFOTAN) IVPB 2 g     2 g Intravenous On call to O.R. 12/17/16 8003 12/17/16 0903      Assessment/Plan: s/p Procedure(s): LAPAROSCOPIC SIGMOID COLECTOMY ([name]) advance diet as tolerated  Home meds for HTN. SCDs and SQ enoxaparin for VTE prophylaxis Leave IV fluids due to chronic kidney disease, stage III.  Anticipate d/c foley and decrease IV fluids tomorrow.  D/C PCA since has not used it much.  Continue tylenol, onQ pain pump, and neurontin for pain control.      LOS: 1 day    [facility] 12/18/2016

## 2016-12-18 NOTE — Progress Notes (Signed)
Pt had 3 bowel movements today. Tolerated soft diet well

## 2016-12-18 NOTE — Progress Notes (Signed)
40mg  iv morphine wasted with RN Daria Pastures after pca was discontinued

## 2016-12-19 LAB — BASIC METABOLIC PANEL
ANION GAP: 8 (ref 5–15)
BUN: 18 mg/dL (ref 6–20)
CALCIUM: 8.3 mg/dL — AB (ref 8.9–10.3)
CHLORIDE: 105 mmol/L (ref 101–111)
CO2: 26 mmol/L (ref 22–32)
Creatinine, Ser: 1.45 mg/dL — ABNORMAL HIGH (ref 0.61–1.24)
GFR calc non Af Amer: 46 mL/min — ABNORMAL LOW (ref >60)
GFR, EST AFRICAN AMERICAN: 53 mL/min — AB (ref >60)
GLUCOSE: 133 mg/dL — AB (ref 65–99)
POTASSIUM: 4.2 mmol/L (ref 3.5–5.1)
Sodium: 139 mmol/L (ref 135–145)

## 2016-12-19 LAB — CBC
HEMATOCRIT: 35.3 % — AB (ref 39.0–52.0)
HEMOGLOBIN: 11 g/dL — AB (ref 13.0–17.0)
MCH: 29.4 pg (ref 26.0–34.0)
MCHC: 31.2 g/dL (ref 30.0–36.0)
MCV: 94.4 fL (ref 78.0–100.0)
Platelets: 222 10*3/uL (ref 150–400)
RBC: 3.74 MIL/uL — AB (ref 4.22–5.81)
RDW: 14.2 % (ref 11.5–15.5)
WBC: 9.8 10*3/uL (ref 4.0–10.5)

## 2016-12-19 NOTE — Procedures (Signed)
Pt had 6 bowel movements today. Tolerating his diet.

## 2016-12-20 LAB — BASIC METABOLIC PANEL
ANION GAP: 7 (ref 5–15)
BUN: 17 mg/dL (ref 6–20)
CALCIUM: 8.5 mg/dL — AB (ref 8.9–10.3)
CHLORIDE: 105 mmol/L (ref 101–111)
CO2: 26 mmol/L (ref 22–32)
CREATININE: 1.47 mg/dL — AB (ref 0.61–1.24)
GFR calc non Af Amer: 45 mL/min — ABNORMAL LOW (ref >60)
GFR, EST AFRICAN AMERICAN: 52 mL/min — AB (ref >60)
Glucose, Bld: 118 mg/dL — ABNORMAL HIGH (ref 65–99)
Potassium: 4 mmol/L (ref 3.5–5.1)
SODIUM: 138 mmol/L (ref 135–145)

## 2016-12-20 LAB — CBC
HEMATOCRIT: 34.6 % — AB (ref 39.0–52.0)
HEMOGLOBIN: 10.9 g/dL — AB (ref 13.0–17.0)
MCH: 29.5 pg (ref 26.0–34.0)
MCHC: 31.5 g/dL (ref 30.0–36.0)
MCV: 93.5 fL (ref 78.0–100.0)
Platelets: 215 10*3/uL (ref 150–400)
RBC: 3.7 MIL/uL — ABNORMAL LOW (ref 4.22–5.81)
RDW: 13.9 % (ref 11.5–15.5)
WBC: 8.9 10*3/uL (ref 4.0–10.5)

## 2016-12-20 MED ORDER — OXYCODONE HCL 5 MG PO TABS: 5.0000 mg | ORAL_TABLET | ORAL | 0 refills | Status: DC | PRN

## 2016-12-20 NOTE — Progress Notes (Signed)
Pt discharged going home his spouse at the bedside, health teachings, prescriptions for pain, next appointment, dressing changed, discontinue peripheral IV line, discontinued bupivacaine q-pump, given all personal belongings, no complain of pain, escorted by Nurse tech via wheelchair.

## 2016-12-20 NOTE — Discharge Summary (Signed)
Patient ID: Edwin Wall [id number] 76 y.o. 01-Sep-1940  Admit date: 12/17/2016  Discharge date and time: 12/20/2016   Admitting Physician: Edwin Wall  Discharge Physician: Edwin Wall  Admission Diagnoses: Sigmoid Colon Cancer  Discharge Diagnoses: igmoid colon cancer  Operations: Procedure(s): LAPAROSCOPIC SIGMOID COLECTOMY  Admission Condition: good  Discharged Condition: good  Indication for Admission: this is a 76 year old man who presents with colorectal cancer.  Initially presented with GI bleeding.  Colonoscopy found 3 small polyps in the ascending and transverse colon and a 15 mm polyp in the transverse colon.  Most notably he had a fungating mass in the sigmoid colon and biopsies showed invasive adenocarcinoma.  MSI testing was done but results are not available at this time.  Denies weight loss.  He saw Dr. Barry Wall in the office.cT scan showed circumferential colonic wall thickening in the mid to distal sigmoid no gross evidence of metastasis by CT. Spinal stenosis.  He underwent preoperative bowel prep and was brought to the operating room electively  Hospital Course: on the day of admission the patient was brought to the operating room and underwent a laparoscopic assisted sigmoid colectomy.  Extraction site lower midline incision.  EEA stapled anastomosis.     Final pathology showed 6.5 cm tumor.  All 30 lymph nodes were negative.  Stage TIII N0.  Margins negative     Postoperatively the patient progressed in diet and activities and bowel function uneventfully.  Pain was well controlled.  By postop day 3 he was tolerating a regular diet, essentially pain-free, was having loose bowel movements and asking to go home.  Examination revealed the anatomy be soft and nontender.  Trocar sites and lower midline incision were healing uneventfully.  Diet and activities were discussed.  Prescription for oxycodone given.  Follow-up with Dr. Barry Wall arranged.  Consults:  None  Significant Diagnostic Studies: surgical pathology  Treatments: surgery: laparoscopic-assisted sigmoid colectomy  Disposition: Home  Patient Instructions:  Allergies as of 12/20/2016   Not on File     Medication List    TAKE these medications   benazepril 40 MG tablet Commonly known as:  LOTENSIN TAKE 1 TABLET EVERY DAY   benazepril-hydrochlorthiazide 20-12.5 MG tablet Commonly known as:  LOTENSIN HCT TAKE 1 TABLET BY MOUTH EVERY DAY   diclofenac 75 MG EC tablet Commonly known as:  VOLTAREN Take 75 mg by mouth daily as needed (for gout pain).   LORazepam 1 MG tablet Commonly known as:  ATIVAN Take 1 mg by mouth daily.   oxyCODONE 5 MG immediate release tablet Commonly known as:  Oxy IR/ROXICODONE Take 1-2 tablets (5-10 mg total) by mouth every 4 (four) hours as needed for moderate pain.            Discharge Care Instructions        Start     Ordered   12/20/16 0000  Discharge wound care:    Comments:  Cover wounds with dry gauze bandage for a few days May shower Otherwise keep wounds clean and dry No ointments   12/20/16 1025      Activity: no driving for 1 week.  No sports or heavy lifting for 5 weeks Diet: low fat, low cholesterol diet Wound Care: as directed  Follow-up:  With Dr. Barry Wall in 2 weeks.  Signed: [name]. Dalbert Wall, M.D., FACS General and minimally invasive surgery Breast and Colorectal Surgery   Addendum: I log known to the [facility] website and reviewed his prescription medication history  12/20/2016, 10:26 AM

## 2016-12-26 ENCOUNTER — Telehealth: Payer: Self-pay | Admitting: Oncology

## 2016-12-26 NOTE — Telephone Encounter (Signed)
Appt has been scheduled for the pt to see Dr. Benay Spice on 10/29 at 2pm. Left the appt date and time on the pt's vm.

## 2016-12-26 NOTE — Progress Notes (Signed)
  Oncology Nurse Navigator Documentation      )Navigator Encounter Type: Telephone (12/26/16 1200)   Abnormal Finding Date: 11/13/16 (12/26/16 1200) Confirmed Diagnosis Date: 12/17/16 (12/26/16 1200) Surgery Date: 12/17/16 (12/26/16 1200)           Treatment Initiated Date: 12/17/16 (12/26/16 1200)     Barriers/Navigation Needs: Family concerns;Education;Coordination of Care (12/26/16 1200) Education: Accessing Care/ Finding Providers (12/26/16 1200) Interventions: Coordination of Care;Education;Psycho-social support (12/26/16 1200)   Coordination of Care: Appts (12/26/16 1200) Education Method: Verbal (12/26/16 1200)   I called [facility] to initiate the referral to Med/Onc that was Ok's by Dr. Barry Dienes in an Epic message. I also spoke with Isaiah Blakes - New Patient Scheduler to schedule an appointment with med/onc. Seth Bake had called patient to seth a time and date and patient's wife wanted to speak with me about why he needed a medical oncology appointment. Neither she or her husband knew that speaking with an oncologist would be part of patient's plan. I explained why it was important and confirmed date and time. I will meet with patient and her husband during their appointment with Dr. Benay Spice on 12/29/16.   Acuity: Level 1 (12/26/16 1200)         Time Spent with Patient: 30 (12/26/16 1200)

## 2016-12-29 ENCOUNTER — Ambulatory Visit (HOSPITAL_BASED_OUTPATIENT_CLINIC_OR_DEPARTMENT_OTHER): Payer: Medicare Other | Admitting: Oncology

## 2016-12-29 VITALS — BP 109/65 | HR 74 | Temp 98.0°F | Resp 19 | Ht 69.5 in | Wt 246.0 lb

## 2016-12-29 DIAGNOSIS — I10 Essential (primary) hypertension: Secondary | ICD-10-CM

## 2016-12-29 DIAGNOSIS — Z800 Family history of malignant neoplasm of digestive organs: Secondary | ICD-10-CM | POA: Diagnosis not present

## 2016-12-29 DIAGNOSIS — Z86010 Personal history of colonic polyps: Secondary | ICD-10-CM | POA: Diagnosis not present

## 2016-12-29 DIAGNOSIS — Z801 Family history of malignant neoplasm of trachea, bronchus and lung: Secondary | ICD-10-CM | POA: Diagnosis not present

## 2016-12-29 DIAGNOSIS — C187 Malignant neoplasm of sigmoid colon: Secondary | ICD-10-CM

## 2016-12-29 NOTE — Progress Notes (Signed)
[facility] Patient Consult   Referring MD: Jacquelyn Antony Kurt 76 y.o.  Aug 16, 1940    Reason for Referral: Colon cancer   HPI: Mr. Dagher was referred to Dr. Benson Norway with rectal bleeding. A colonoscopy 11/11/2016 revealed a fungating mass in the sigmoid colon. No bleeding was present. The mass was biopsied. 3 polyps were found in the transverse and ascending colon. The polyps were removed. A 15 mm polyp was removed from the transverse colon. The pathology revealed fragments of tubular adenoma involving the transverse and ascending polyps. The mass at the sigmoid colon returned as an invasive moderately differentiated adenocarcinoma.  A CT of the abdomen and pelvis on 11/13/2016 revealed bibasilar atelectasis. No focal liver lesion. Circumferential wall thickening in the mid to distal sigmoid colon. No bowel obstruction. No adenopathy.  He was referred to Dr. Barry Dienes and was taken to a laparoscopic sigmoid colectomy on 12/17/2016. The pathology ([id number]) revealed well-differentiated invasive adenocarcinoma. Tumor extended into pericolonic soft tissue. The resection margins were negative. No macroscopic tumor perforation. No lymphovascular or perineural invasion. 30 lymph nodes were negative for metastatic carcinoma. There was an additional tubular adenoma and hyperplastic polyp. The tumor returned MSI-stable with no loss of mismatch repair protein expression.  He reports an uneventful operative recovery.  Past Medical History:  Diagnosis Date  . Anxiety   . Arthritis   . Cataract   . Colon cancer (HCC)-sigmoid, T3 N0  dx'd 11/2016         . Gout   . Hemorrhoids   . History of kidney stones    "passed it"  . Hypertension   . Pre-diabetes     Past Surgical History:  Procedure Laterality Date  . COLECTOMY  12/17/2016   lap; partial sigmoid colectomy/notes 12/17/2016  . COLONOSCOPY W/ BIOPSIES AND POLYPECTOMY  11/11/2016  . LAPAROSCOPIC  SIGMOID COLECTOMY [name] 12/17/2016   Procedure: LAPAROSCOPIC SIGMOID COLECTOMY;  Surgeon: [name], MD;  Location: [facility];  Service: General;  Laterality: [name];  . NASAL SEPTUM SURGERY    . TONSILLECTOMY      Medications: Reviewed  Allergies: Not on File  Family history: His mother and father had lung cancer and were smokers. A half brother had colon cancer. No other family history of cancer.  Social History:   He lives with his wife in [place]. He is retired from a Engineer, materials occupation. He does not use cigarettes or alcohol. No transfusion history. No risk factor for HIV or hepatitis.  ROS:   Positives include: Rectal bleeding beginning in September 2017  A complete ROS was otherwise negative.  Physical Exam:  Blood pressure 109/65, pulse 74, temperature 98 F (36.7 C), temperature source Oral, resp. rate 19, height 5' 9.5" (1.765 m), weight 246 lb (111.6 kg), SpO2 100 %.  HEENT: Oropharynx without visible mass, neck without mass Lungs: Inspiratory rales at the lower posterior chest bilaterally, no respiratory distress Cardiac: Regular rate and rhythm Abdomen: No hepatosplenomegaly, no mass, nontender, healed surgical incisions GU: Testes without mass, the left testicle is smaller than the right side  Vascular: No leg edema Lymph nodes: No cervical, supraclavicular, axillary, or inguinal nodes Neurologic: Alert and oriented, the motor exam appears intact in the upper and lower extremities Skin: No rash Musculoskeletal: No spine tenderness   LAB:   Lab Results  Component Value Date   CEA1 4.5 12/15/2016    Imaging:  CT images 11/13/2016-images reviewed   Assessment/Plan:   1. Sigmoid colon  cancer, stage II (T3 N0), status post a sigmoid colectomy 12/17/2016  MSI-stable, no loss of mismatch repair protein expression  Upper normal preoperative CEA 2. Hypertension 3. Gout 4. Multiple colon polyps on the colonoscopy 11/11/2016 5. Family history of  lung and colon cancer   Disposition:   [name] has been diagnosed with stage II colon cancer. We discussed the prognosis and reviewed the details of the surgical pathology report. He has a good prognosis for a long-term disease-free survival. I do not recommend adjuvant systemic therapy.  He does not appear to have hereditary non-polyposis colon cancer syndrome, but his family members are at increased risk of developing colorectal cancer and should receive appropriate screening.  He had multiple colon polyps on the preoperative colonoscopy. He should have a one-year surveillance colonoscopy.  We discussed diet and exercise maneuvers that may decrease the risk of developing colorectal cancer.  Mr. Holness would like to continue clinical follow-up with Dr. Melford Aase. I recommend a CEA checked every 6 months for 2 years and then yearly up to 5 years from diagnosis.  I am available to see him in the future as needed.  50 minutes were spent with the patient today. The majority of the time was used for counseling and coordination of care.   Donneta Romberg, MD  12/29/2016, 2:09 PM

## 2020-07-15 ENCOUNTER — Emergency Department (HOSPITAL_COMMUNITY): Payer: Medicare Other

## 2020-07-15 ENCOUNTER — Other Ambulatory Visit: Payer: Self-pay

## 2020-07-15 ENCOUNTER — Inpatient Hospital Stay (HOSPITAL_COMMUNITY): Payer: Medicare Other

## 2020-07-15 ENCOUNTER — Encounter (HOSPITAL_COMMUNITY): Payer: Self-pay | Admitting: Emergency Medicine

## 2020-07-15 ENCOUNTER — Inpatient Hospital Stay (HOSPITAL_COMMUNITY)
Admission: EM | Admit: 2020-07-15 | Discharge: 2020-07-18 | DRG: 065 | Disposition: A | Discharge status: Rehabilitation Facility | Payer: Medicare Other | Attending: Infectious Diseases | Admitting: Infectious Diseases

## 2020-07-15 DIAGNOSIS — G8191 Hemiplegia, unspecified affecting right dominant side: Secondary | ICD-10-CM | POA: Diagnosis present

## 2020-07-15 DIAGNOSIS — I10 Essential (primary) hypertension: Secondary | ICD-10-CM | POA: Diagnosis not present

## 2020-07-15 DIAGNOSIS — Z6836 Body mass index (BMI) 36.0-36.9, adult: Secondary | ICD-10-CM

## 2020-07-15 DIAGNOSIS — E1165 Type 2 diabetes mellitus with hyperglycemia: Secondary | ICD-10-CM | POA: Diagnosis present

## 2020-07-15 DIAGNOSIS — I4891 Unspecified atrial fibrillation: Secondary | ICD-10-CM

## 2020-07-15 DIAGNOSIS — R400 Somnolence: Secondary | ICD-10-CM | POA: Diagnosis not present

## 2020-07-15 DIAGNOSIS — Z20822 Contact with and (suspected) exposure to covid-19: Secondary | ICD-10-CM | POA: Diagnosis present

## 2020-07-15 DIAGNOSIS — M109 Gout, unspecified: Secondary | ICD-10-CM | POA: Diagnosis present

## 2020-07-15 DIAGNOSIS — K5900 Constipation, unspecified: Secondary | ICD-10-CM | POA: Diagnosis not present

## 2020-07-15 DIAGNOSIS — E1122 Type 2 diabetes mellitus with diabetic chronic kidney disease: Secondary | ICD-10-CM | POA: Diagnosis present

## 2020-07-15 DIAGNOSIS — N1832 Chronic kidney disease, stage 3b: Secondary | ICD-10-CM | POA: Diagnosis present

## 2020-07-15 DIAGNOSIS — E785 Hyperlipidemia, unspecified: Secondary | ICD-10-CM | POA: Diagnosis not present

## 2020-07-15 DIAGNOSIS — Z9049 Acquired absence of other specified parts of digestive tract: Secondary | ICD-10-CM | POA: Diagnosis not present

## 2020-07-15 DIAGNOSIS — Z79899 Other long term (current) drug therapy: Secondary | ICD-10-CM | POA: Diagnosis not present

## 2020-07-15 DIAGNOSIS — M1990 Unspecified osteoarthritis, unspecified site: Secondary | ICD-10-CM | POA: Diagnosis present

## 2020-07-15 DIAGNOSIS — R1310 Dysphagia, unspecified: Secondary | ICD-10-CM | POA: Diagnosis not present

## 2020-07-15 DIAGNOSIS — R29706 NIHSS score 6: Secondary | ICD-10-CM | POA: Diagnosis not present

## 2020-07-15 DIAGNOSIS — E7800 Pure hypercholesterolemia, unspecified: Secondary | ICD-10-CM | POA: Diagnosis not present

## 2020-07-15 DIAGNOSIS — R4701 Aphasia: Secondary | ICD-10-CM | POA: Diagnosis present

## 2020-07-15 DIAGNOSIS — R29708 NIHSS score 8: Secondary | ICD-10-CM | POA: Diagnosis present

## 2020-07-15 DIAGNOSIS — E119 Type 2 diabetes mellitus without complications: Secondary | ICD-10-CM

## 2020-07-15 DIAGNOSIS — I639 Cerebral infarction, unspecified: Secondary | ICD-10-CM

## 2020-07-15 DIAGNOSIS — G8190 Hemiplegia, unspecified affecting unspecified side: Secondary | ICD-10-CM | POA: Diagnosis not present

## 2020-07-15 DIAGNOSIS — R29810 Facial weakness: Secondary | ICD-10-CM | POA: Diagnosis present

## 2020-07-15 DIAGNOSIS — N179 Acute kidney failure, unspecified: Secondary | ICD-10-CM | POA: Diagnosis present

## 2020-07-15 DIAGNOSIS — E669 Obesity, unspecified: Secondary | ICD-10-CM | POA: Diagnosis present

## 2020-07-15 DIAGNOSIS — R471 Dysarthria and anarthria: Secondary | ICD-10-CM | POA: Diagnosis present

## 2020-07-15 DIAGNOSIS — J157 Pneumonia due to Mycoplasma pneumoniae: Secondary | ICD-10-CM | POA: Diagnosis present

## 2020-07-15 DIAGNOSIS — E1169 Type 2 diabetes mellitus with other specified complication: Secondary | ICD-10-CM | POA: Diagnosis not present

## 2020-07-15 DIAGNOSIS — R300 Dysuria: Secondary | ICD-10-CM | POA: Diagnosis not present

## 2020-07-15 DIAGNOSIS — F419 Anxiety disorder, unspecified: Secondary | ICD-10-CM | POA: Diagnosis present

## 2020-07-15 DIAGNOSIS — Z85038 Personal history of other malignant neoplasm of large intestine: Secondary | ICD-10-CM | POA: Diagnosis not present

## 2020-07-15 DIAGNOSIS — D72829 Elevated white blood cell count, unspecified: Secondary | ICD-10-CM | POA: Diagnosis present

## 2020-07-15 DIAGNOSIS — I63412 Cerebral infarction due to embolism of left middle cerebral artery: Secondary | ICD-10-CM | POA: Diagnosis present

## 2020-07-15 DIAGNOSIS — I4510 Unspecified right bundle-branch block: Secondary | ICD-10-CM | POA: Diagnosis present

## 2020-07-15 DIAGNOSIS — Z85048 Personal history of other malignant neoplasm of rectum, rectosigmoid junction, and anus: Secondary | ICD-10-CM

## 2020-07-15 DIAGNOSIS — Z87442 Personal history of urinary calculi: Secondary | ICD-10-CM

## 2020-07-15 DIAGNOSIS — I6389 Other cerebral infarction: Secondary | ICD-10-CM | POA: Diagnosis not present

## 2020-07-15 DIAGNOSIS — R29710 NIHSS score 10: Secondary | ICD-10-CM | POA: Diagnosis not present

## 2020-07-15 DIAGNOSIS — I63512 Cerebral infarction due to unspecified occlusion or stenosis of left middle cerebral artery: Secondary | ICD-10-CM

## 2020-07-15 DIAGNOSIS — I69391 Dysphagia following cerebral infarction: Secondary | ICD-10-CM | POA: Diagnosis not present

## 2020-07-15 DIAGNOSIS — H269 Unspecified cataract: Secondary | ICD-10-CM | POA: Diagnosis present

## 2020-07-15 DIAGNOSIS — R7303 Prediabetes: Secondary | ICD-10-CM

## 2020-07-15 DIAGNOSIS — I129 Hypertensive chronic kidney disease with stage 1 through stage 4 chronic kidney disease, or unspecified chronic kidney disease: Secondary | ICD-10-CM | POA: Diagnosis present

## 2020-07-15 DIAGNOSIS — E1149 Type 2 diabetes mellitus with other diabetic neurological complication: Secondary | ICD-10-CM | POA: Diagnosis not present

## 2020-07-15 DIAGNOSIS — R112 Nausea with vomiting, unspecified: Secondary | ICD-10-CM | POA: Diagnosis not present

## 2020-07-15 DIAGNOSIS — K224 Dyskinesia of esophagus: Secondary | ICD-10-CM | POA: Diagnosis present

## 2020-07-15 DIAGNOSIS — Z823 Family history of stroke: Secondary | ICD-10-CM

## 2020-07-15 DIAGNOSIS — E1142 Type 2 diabetes mellitus with diabetic polyneuropathy: Secondary | ICD-10-CM | POA: Diagnosis present

## 2020-07-15 DIAGNOSIS — Z825 Family history of asthma and other chronic lower respiratory diseases: Secondary | ICD-10-CM | POA: Diagnosis not present

## 2020-07-15 DIAGNOSIS — Z801 Family history of malignant neoplasm of trachea, bronchus and lung: Secondary | ICD-10-CM | POA: Diagnosis not present

## 2020-07-15 DIAGNOSIS — R29709 NIHSS score 9: Secondary | ICD-10-CM | POA: Diagnosis not present

## 2020-07-15 DIAGNOSIS — I69351 Hemiplegia and hemiparesis following cerebral infarction affecting right dominant side: Secondary | ICD-10-CM | POA: Diagnosis present

## 2020-07-15 DIAGNOSIS — I69320 Aphasia following cerebral infarction: Secondary | ICD-10-CM | POA: Diagnosis not present

## 2020-07-15 DIAGNOSIS — I480 Paroxysmal atrial fibrillation: Secondary | ICD-10-CM

## 2020-07-15 DIAGNOSIS — R0600 Dyspnea, unspecified: Secondary | ICD-10-CM

## 2020-07-15 LAB — I-STAT CHEM 8, ED
BUN: 21 mg/dL (ref 8–23)
Calcium, Ion: 1.14 mmol/L — ABNORMAL LOW (ref 1.15–1.40)
Chloride: 109 mmol/L (ref 98–111)
Creatinine, Ser: 1.5 mg/dL — ABNORMAL HIGH (ref 0.61–1.24)
Glucose, Bld: 151 mg/dL — ABNORMAL HIGH (ref 70–99)
HCT: 42 % (ref 39.0–52.0)
Hemoglobin: 14.3 g/dL (ref 13.0–17.0)
Potassium: 3.9 mmol/L (ref 3.5–5.1)
Sodium: 143 mmol/L (ref 135–145)
TCO2: 25 mmol/L (ref 22–32)

## 2020-07-15 LAB — CBG MONITORING, ED: Glucose-Capillary: 167 mg/dL — ABNORMAL HIGH (ref 70–99)

## 2020-07-15 LAB — CBC
HCT: 44.1 % (ref 39.0–52.0)
Hemoglobin: 14.3 g/dL (ref 13.0–17.0)
MCH: 31.4 pg (ref 26.0–34.0)
MCHC: 32.4 g/dL (ref 30.0–36.0)
MCV: 96.9 fL (ref 80.0–100.0)
Platelets: 188 10*3/uL (ref 150–400)
RBC: 4.55 MIL/uL (ref 4.22–5.81)
RDW: 12.9 % (ref 11.5–15.5)
WBC: 6.8 10*3/uL (ref 4.0–10.5)
nRBC: 0 % (ref 0.0–0.2)

## 2020-07-15 LAB — BILIRUBIN, FRACTIONATED(TOT/DIR/INDIR)
Bilirubin, Direct: 0.2 mg/dL (ref 0.0–0.2)
Indirect Bilirubin: 1.4 mg/dL — ABNORMAL HIGH (ref 0.3–0.9)
Total Bilirubin: 1.6 mg/dL — ABNORMAL HIGH (ref 0.3–1.2)

## 2020-07-15 LAB — PROTIME-INR
INR: 1.1 (ref 0.8–1.2)
Prothrombin Time: 13.9 seconds (ref 11.4–15.2)

## 2020-07-15 LAB — COMPREHENSIVE METABOLIC PANEL
ALT: 13 U/L (ref 0–44)
AST: 19 U/L (ref 15–41)
Albumin: 3.5 g/dL (ref 3.5–5.0)
Alkaline Phosphatase: 59 U/L (ref 38–126)
Anion gap: 9 (ref 5–15)
BUN: 18 mg/dL (ref 8–23)
CO2: 24 mmol/L (ref 22–32)
Calcium: 9.2 mg/dL (ref 8.9–10.3)
Chloride: 108 mmol/L (ref 98–111)
Creatinine, Ser: 1.52 mg/dL — ABNORMAL HIGH (ref 0.61–1.24)
GFR, Estimated: 46 mL/min — ABNORMAL LOW (ref >60)
Glucose, Bld: 155 mg/dL — ABNORMAL HIGH (ref 70–99)
Potassium: 3.8 mmol/L (ref 3.5–5.1)
Sodium: 141 mmol/L (ref 135–145)
Total Bilirubin: 1.9 mg/dL — ABNORMAL HIGH (ref 0.3–1.2)
Total Protein: 6.4 g/dL — ABNORMAL LOW (ref 6.5–8.1)

## 2020-07-15 LAB — DIFFERENTIAL
Abs Immature Granulocytes: 0.03 10*3/uL (ref 0.00–0.07)
Basophils Absolute: 0.1 10*3/uL (ref 0.0–0.1)
Basophils Relative: 1 %
Eosinophils Absolute: 0.2 10*3/uL (ref 0.0–0.5)
Eosinophils Relative: 3 %
Immature Granulocytes: 0 %
Lymphocytes Relative: 23 %
Lymphs Abs: 1.6 10*3/uL (ref 0.7–4.0)
Monocytes Absolute: 0.5 10*3/uL (ref 0.1–1.0)
Monocytes Relative: 7 %
Neutro Abs: 4.5 10*3/uL (ref 1.7–7.7)
Neutrophils Relative %: 66 %

## 2020-07-15 LAB — GLUCOSE, CAPILLARY
Glucose-Capillary: 107 mg/dL — ABNORMAL HIGH (ref 70–99)
Glucose-Capillary: 120 mg/dL — ABNORMAL HIGH (ref 70–99)

## 2020-07-15 LAB — MAGNESIUM: Magnesium: 1.9 mg/dL (ref 1.7–2.4)

## 2020-07-15 LAB — APTT: aPTT: 28 seconds (ref 24–36)

## 2020-07-15 LAB — SARS CORONAVIRUS 2 (TAT 6-24 HRS): SARS Coronavirus 2: NEGATIVE

## 2020-07-15 IMAGING — CT CT HEAD CODE STROKE
3 series · 15 of 47 positions shown, 18 images · non-contrast
Comparison: None.

CLINICAL DATA: Code stroke.  Aphasia

EXAM:
CT HEAD WITHOUT CONTRAST
TECHNIQUE: Contiguous axial images were obtained from the base of the skull
through the vertex without intravenous contrast.

[Series 3: head 5.0 st · axial · 0.42mm/px · z∈[+1147,+1287]mm · 9 of 34 slices shown, 12 images]
[im 3/34  brain]
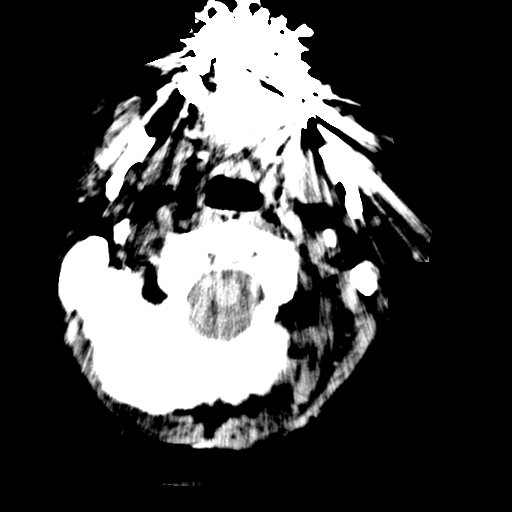
[im 3/34  bone]
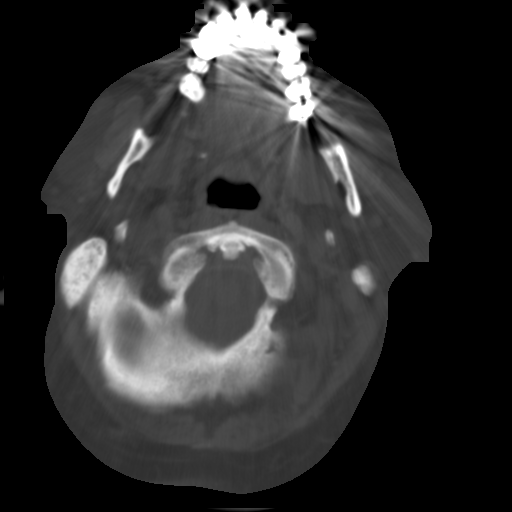
[im 6/34  brain]
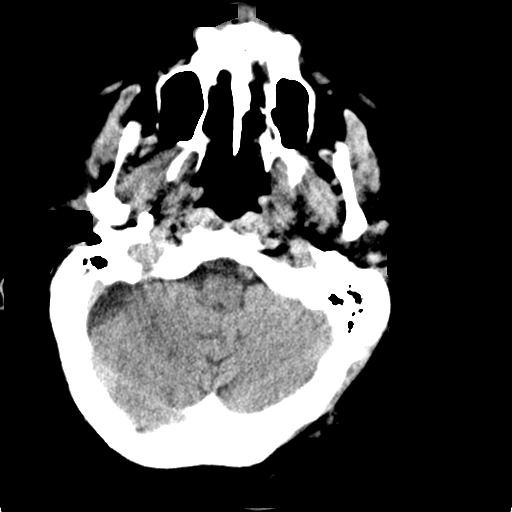
[im 10/34  brain]
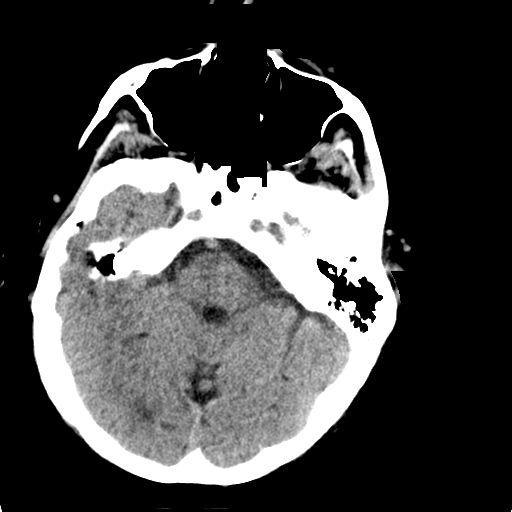
[im 13/34  brain]
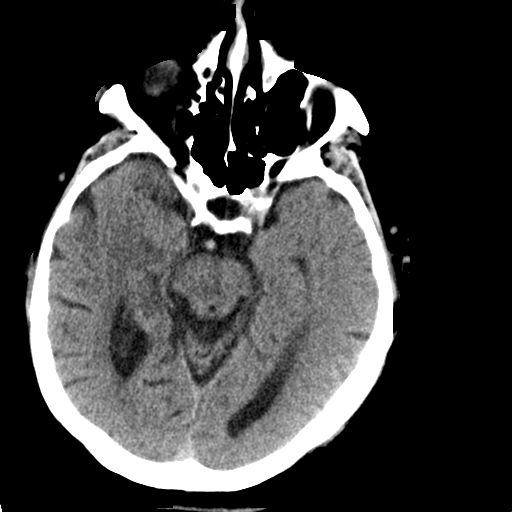
[im 18/34  brain]
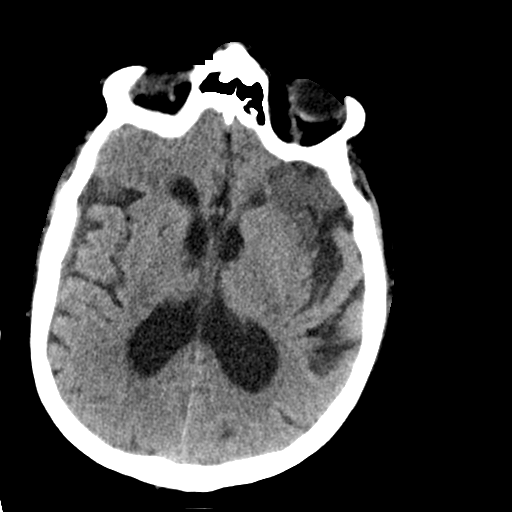
[im 18/34  bone]
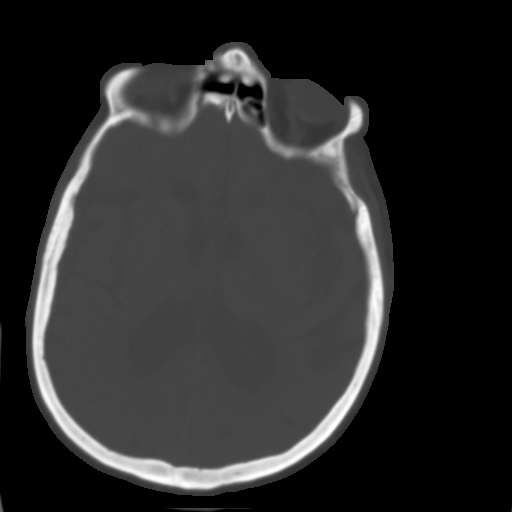
[im 21/34  brain]
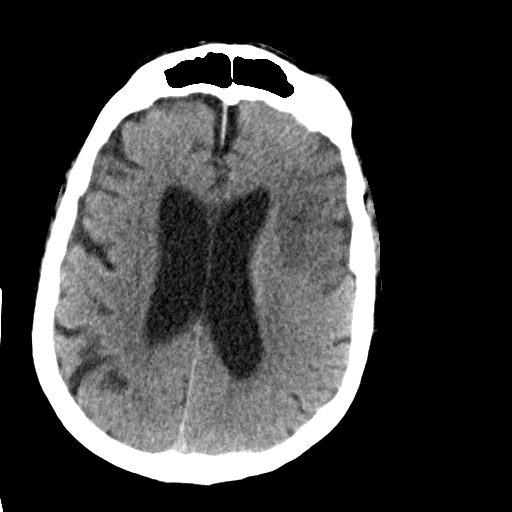
[im 24/34  brain]
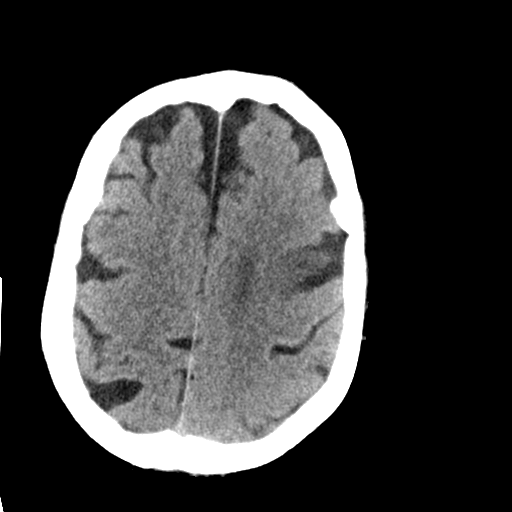
[im 28/34  brain]
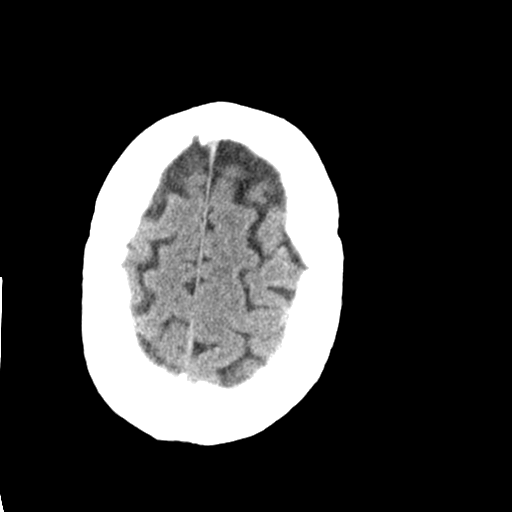
[im 31/34  brain]
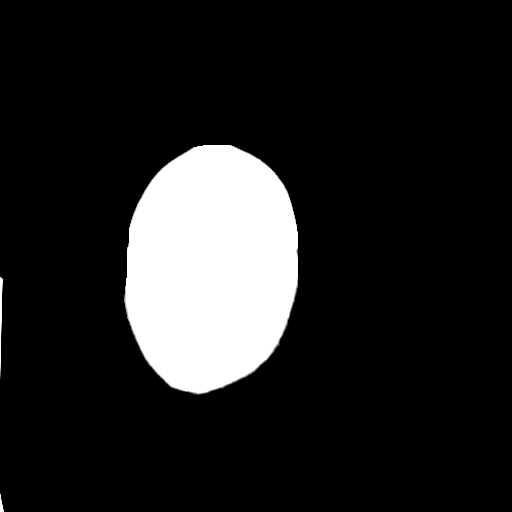
[im 31/34  bone]
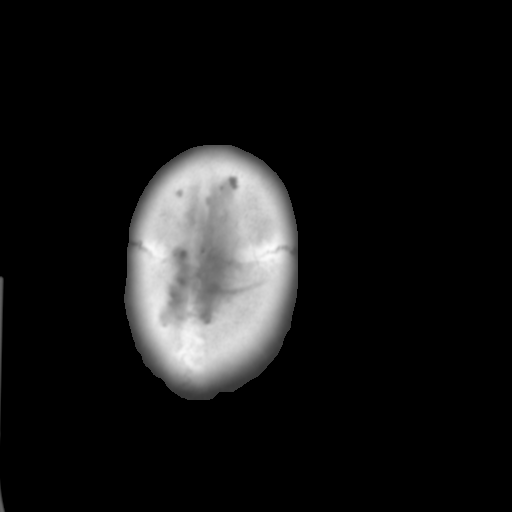

[Series 5: head 3.0 cor st · coronal · 0.33mm/px · 3 of 74 slices shown]
[im 25/74  brain]
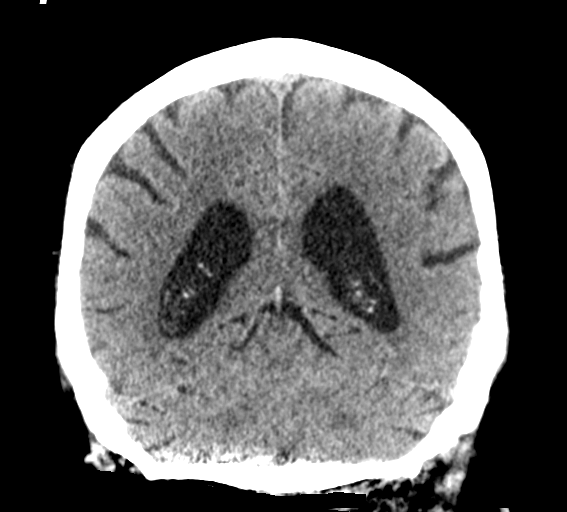
[im 33/74  brain]
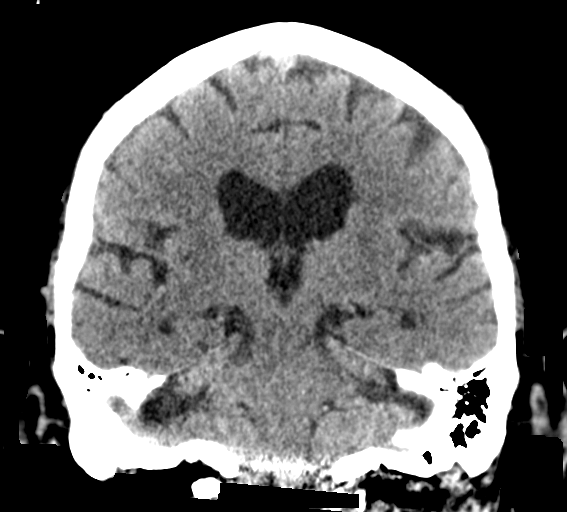
[im 41/74  brain]
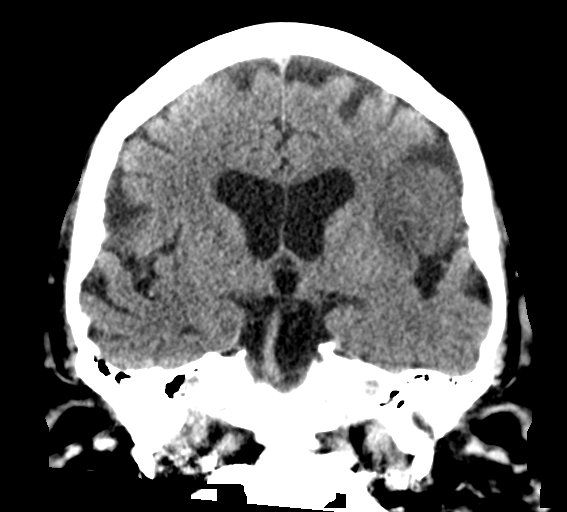

[Series 6: head 3.0 sag st · sagittal · 0.33mm/px · 3 of 67 slices shown]
[im 23/67  brain]
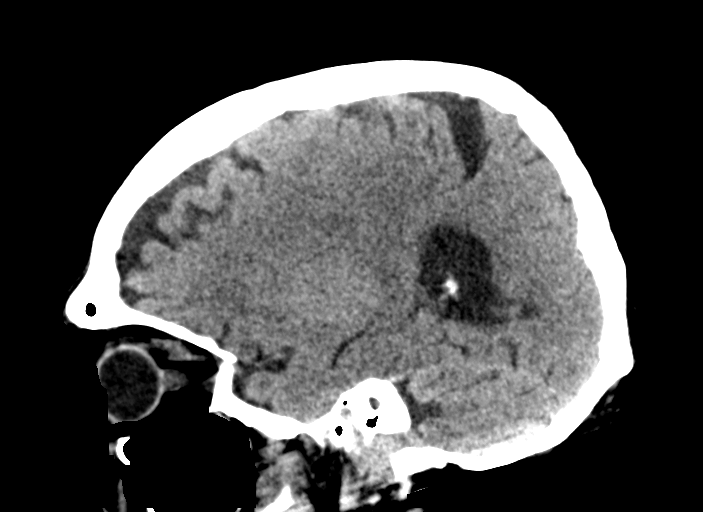
[im 34/67  brain]
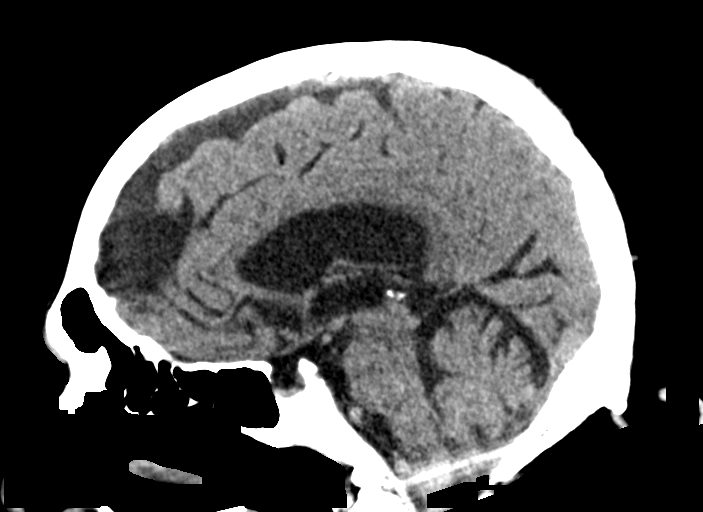
[im 45/67  brain]
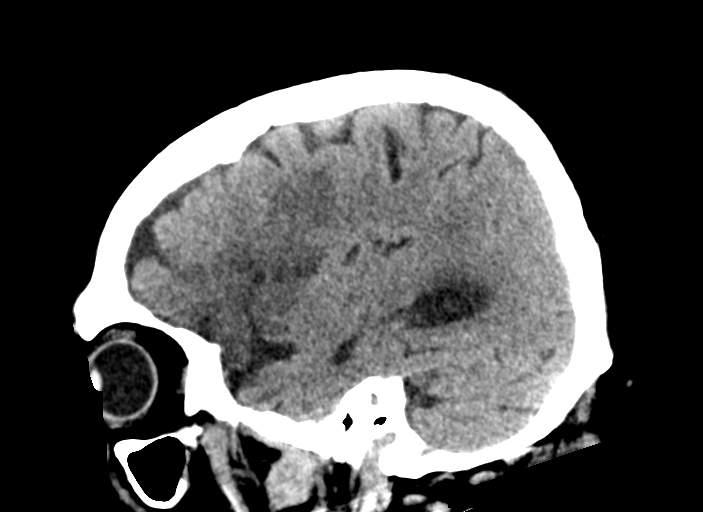

[15 of 47 positions shown; findings below may reference images not displayed]

FINDINGS: Brain: Cytotoxic edema in the anterior insula, anterior inferior
frontal lobe, and high lateral frontal lobe. Borderline involvement
of the putamen. No hemorrhage, hydrocephalus, or pre-existing
infarct. Cerebral volume loss without specific pattern.

Vascular: Dense left M2 branch at the level of infarct.

Skull: Negative

Sinuses/Orbits: Negative

Other: These results were called by telephone at the time of
interpretation on [DATE] at [DATE] to Dr DANGER , who verbally
acknowledged these results.

ASPECTS (Alberta Stroke Program Early CT Score)

- Ganglionic level infarction (caudate, lentiform nuclei, internal
capsule, insula, M1-M3 cortex): 4-5

- Supraganglionic infarction (M4-M6 cortex): 2

Total score (0-10 with 10 being normal): 6-7
IMPRESSION: 1. Acute infarct involving the anterior division left MCA territory.
ASPECTS is 6-7.
2. No acute hemorrhage.

## 2020-07-15 IMAGING — MR MR HEAD W/O CM
12 of 14 series · 37 of 48 positions shown · non-contrast
Comparison: Noncontrast head CT, CT angiogram head/neck and CT
perfusion performed earlier today [DATE].

CLINICAL DATA: Neuro deficit, acute, stroke suspected.

EXAM:
MRI HEAD WITHOUT CONTRAST
TECHNIQUE: Multiplanar, multiecho pulse sequences of the brain and surrounding
structures were obtained without intravenous contrast.

[Series 5: DWI · axial · 3.0mm · 0.88mm/px · z∈[-88,+59]mm · 6 of 100 slices shown (1 of 4)]
[im 1/100]
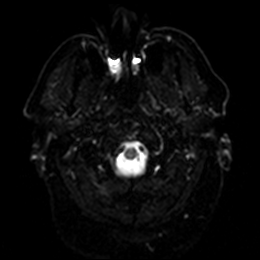
[im 20/100]
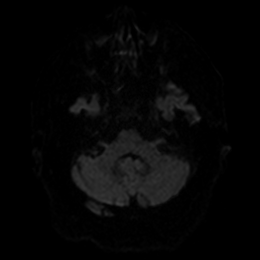
[im 40/100]
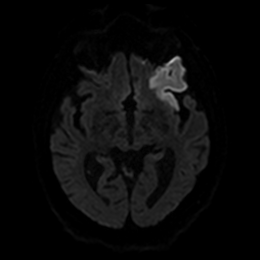
[im 60/100]
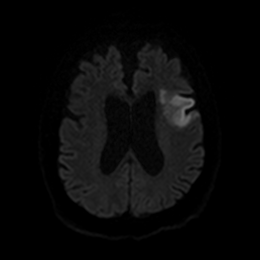
[im 80/100]
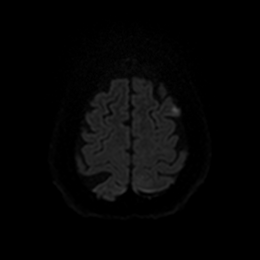
[im 100/100]
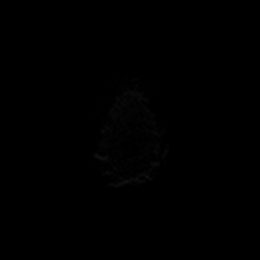

[Series 6: DWI · axial · 3.0mm · 0.88mm/px · z∈[-88,+59]mm · 3 of 50 slices shown (2 of 4)]
[im 1/50]
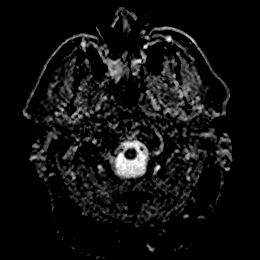
[im 25/50]
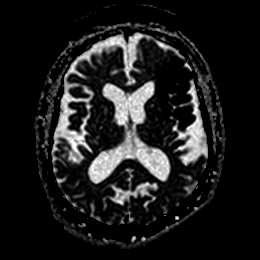
[im 50/50]
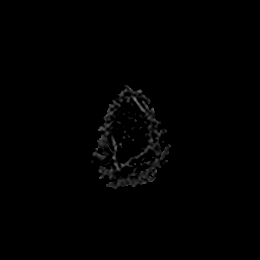

[Series 7: DWI · coronal · 4.0mm · 0.88mm/px · 4 of 68 slices shown (3 of 4)]
[im 1/68]
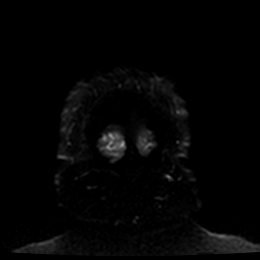
[im 23/68]
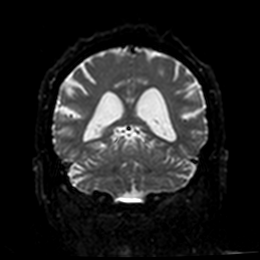
[im 45/68]
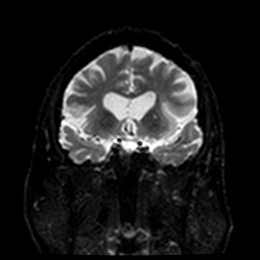
[im 68/68]
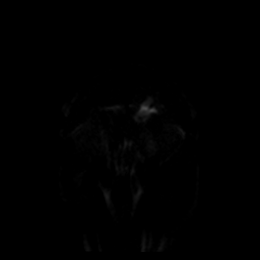

[Series 8: DWI · coronal · 4.0mm · 0.88mm/px · 2 of 34 slices shown (4 of 4)]
[im 1/34]
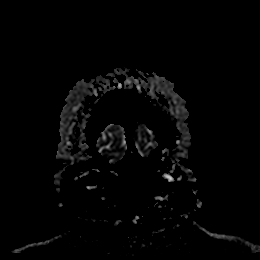
[im 34/34]
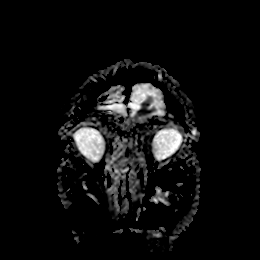

[Series 9: T1 · sagittal · 5.0mm · 0.75mm/px · 1 of 23 slices shown]
[im 1/23]
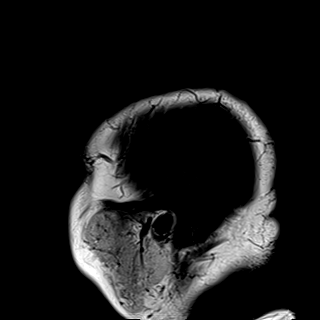

[Series 10: T2 · axial · 5.0mm · 0.72mm/px · z∈[-94,+61]mm · 2 of 27 slices shown (1 of 2)]
[im 1/27]
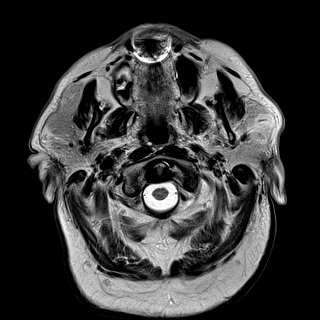
[im 27/27]
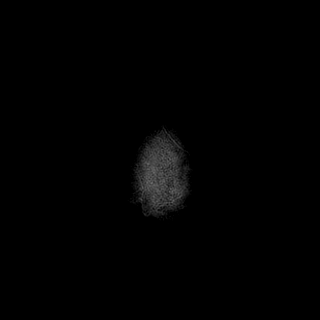

[Series 11: FLAIR · axial · 5.0mm · 0.45mm/px · z∈[-94,+61]mm · 2 of 27 slices shown]
[im 1/27]
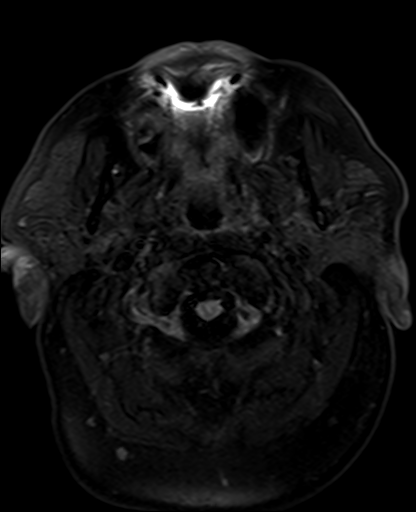
[im 27/27]
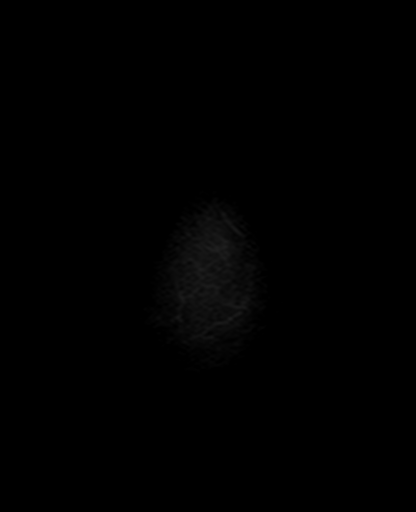

[Series 12: mag_images · axial · 3.0mm · 0.90mm/px · z∈[-101,+75]mm · 4 of 60 slices shown]
[im 1/60]
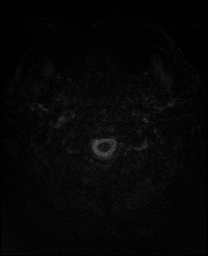
[im 20/60]
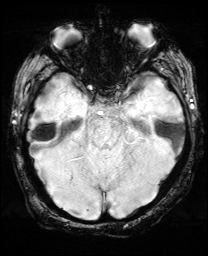
[im 40/60]
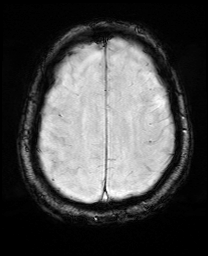
[im 60/60]
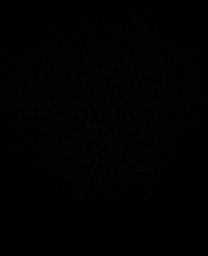

[Series 13: pha_images · axial · 3.0mm · 0.90mm/px · z∈[-101,+75]mm · 4 of 57 slices shown]
[im 1/57]
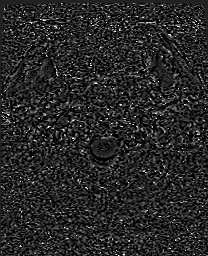
[im 19/57]
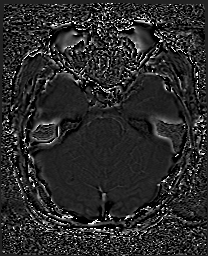
[im 38/57]
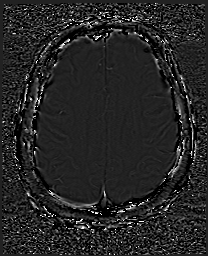
[im 57/57]
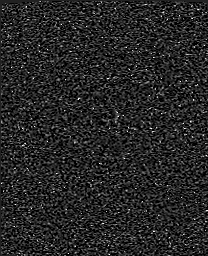

[Series 14: swi_images · axial · 3.0mm · 0.90mm/px · z∈[-101,+75]mm · 4 of 60 slices shown]
[im 1/60]
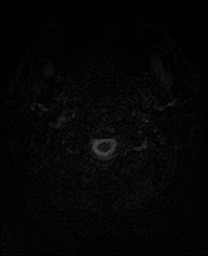
[im 20/60]
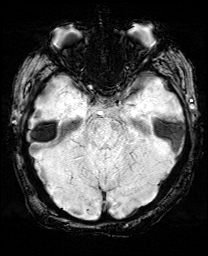
[im 40/60]
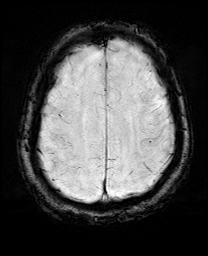
[im 60/60]
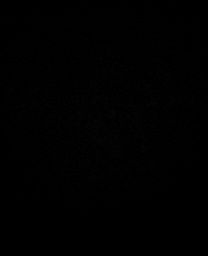

[Series 15: mip_images(sw) · axial · 24.0mm · 0.90mm/px · z∈[-91,+64]mm · 3 of 53 slices shown]
[im 1/53]
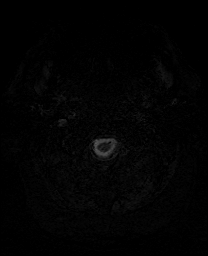
[im 27/53]
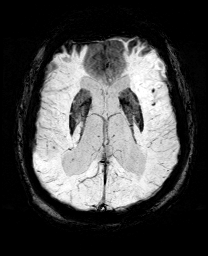
[im 53/53]
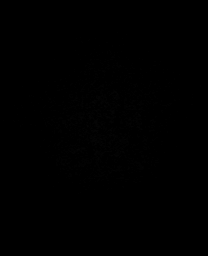

[Series 17: T2 · coronal · 5.0mm · 0.34mm/px · 2 of 29 slices shown (2 of 2)]
[im 1/29]
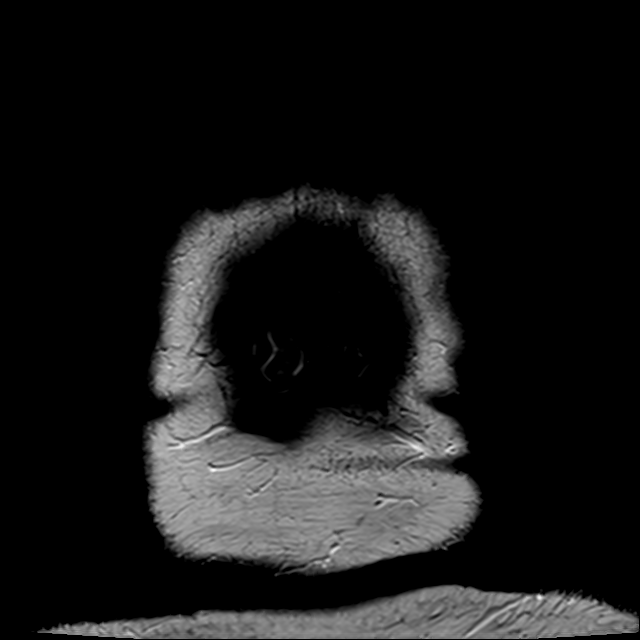
[im 29/29]
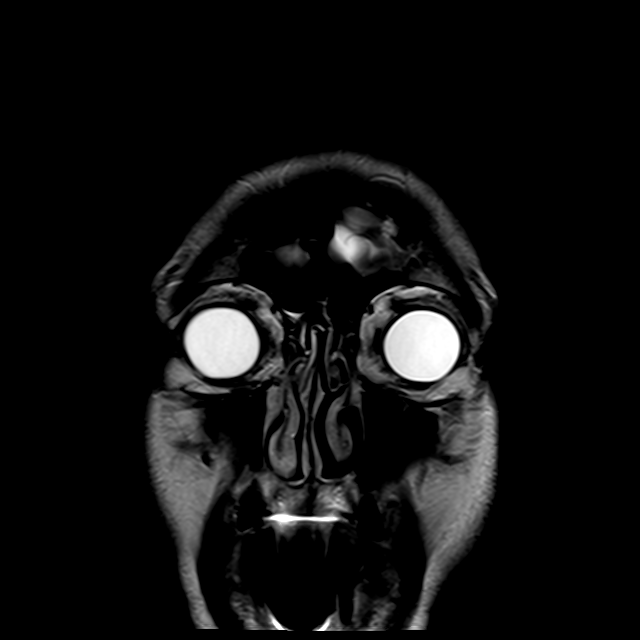

[37 of 48 positions shown; findings below may reference images not displayed]

FINDINGS: Brain:

Mild cerebral and cerebellar atrophy.

Acute cortical/subcortical infarct affecting the left frontal
operculum, anterolateral left frontal lobe and left insula. The
infarct measures up to 6.8 x 3.2 x 5.5 cm in greatest dimensions. No
evidence of hemorrhagic conversion. No significant mass effect at
this time.

Background mild multifocal T2/FLAIR hyperintensity within the
cerebral white matter, nonspecific but compatible with chronic small
vessel ischemic disease.

No evidence of intracranial mass.

No chronic intracranial blood products.

No extra-axial fluid collection.

No midline shift.

Vascular: T2* signal loss at site of a known left M2 MCA occlusion.

Skull and upper cervical spine: No focal marrow lesion.

Sinuses/Orbits: Visualized orbits show no acute finding. Trace
scattered paranasal sinus mucosal thickening. Small right maxillary
sinus mucous retention cyst.

Other: Bilateral mastoid effusions
IMPRESSION: 6.8 x 3.2 x 5.5 cm acute cortical/subcortical infarct affecting the
left frontal operculum, anterolateral left frontal lobe and left
insula (MCA vascular territory). No evidence of hemorrhagic
conversion. No significant mass effect at this time.

Background mild generalized parenchymal atrophy and cerebral white
matter chronic small vessel ischemic disease.

Bilateral mastoid effusions.

## 2020-07-15 MED ORDER — STROKE: EARLY STAGES OF RECOVERY BOOK
Freq: Once | Status: AC
  Filled 2020-07-15: qty 1

## 2020-07-15 MED ORDER — INSULIN ASPART 100 UNIT/ML IJ SOLN
0.0000 [IU] | Freq: Three times a day (TID) | INTRAMUSCULAR | Status: DC
  Administered 2020-07-16 – 2020-07-17 (×4): 2 [IU] via SUBCUTANEOUS
  Administered 2020-07-17: 3 [IU] via SUBCUTANEOUS
  Administered 2020-07-18 (×2): 2 [IU] via SUBCUTANEOUS

## 2020-07-15 MED ORDER — ACETAMINOPHEN 325 MG PO TABS: 650.0000 mg | ORAL_TABLET | ORAL | Status: DC | PRN

## 2020-07-15 MED ORDER — ROSUVASTATIN CALCIUM 20 MG PO TABS
40.0000 mg | ORAL_TABLET | Freq: Every day | ORAL | Status: DC
  Administered 2020-07-16 – 2020-07-18 (×3): 40 mg via ORAL
  Filled 2020-07-15 (×3): qty 2

## 2020-07-15 MED ORDER — SENNOSIDES-DOCUSATE SODIUM 8.6-50 MG PO TABS: 1.0000 | ORAL_TABLET | Freq: Every evening | ORAL | Status: DC | PRN

## 2020-07-15 MED ORDER — ASPIRIN 81 MG PO CHEW: 81.0000 mg | CHEWABLE_TABLET | Freq: Every day | ORAL | Status: DC

## 2020-07-15 MED ORDER — ASPIRIN 325 MG PO TABS
325.0000 mg | ORAL_TABLET | Freq: Once | ORAL | Status: DC
  Filled 2020-07-15: qty 1

## 2020-07-15 MED ORDER — MAGNESIUM SULFATE 2 GM/50ML IV SOLN
2.0000 g | Freq: Once | INTRAVENOUS | Status: AC
  Administered 2020-07-15: 2 g via INTRAVENOUS
  Filled 2020-07-15: qty 50

## 2020-07-15 MED ORDER — POTASSIUM CHLORIDE 10 MEQ/100ML IV SOLN
10.0000 meq | INTRAVENOUS | Status: AC
  Administered 2020-07-15 (×3): 10 meq via INTRAVENOUS
  Filled 2020-07-15 (×3): qty 100

## 2020-07-15 MED ORDER — ACETAMINOPHEN 650 MG RE SUPP: 650.0000 mg | RECTAL | Status: DC | PRN

## 2020-07-15 MED ORDER — SODIUM CHLORIDE 0.9% FLUSH
3.0000 mL | Freq: Once | INTRAVENOUS | Status: AC
Start: 2020-07-15 — End: 2020-07-15
  Administered 2020-07-15: 3 mL via INTRAVENOUS

## 2020-07-15 MED ORDER — ASPIRIN EC 81 MG PO TBEC: 81.0000 mg | DELAYED_RELEASE_TABLET | Freq: Every day | ORAL | Status: DC

## 2020-07-15 MED ORDER — POTASSIUM CHLORIDE 20 MEQ PO PACK: 40.0000 meq | PACK | Freq: Two times a day (BID) | ORAL | Status: DC

## 2020-07-15 MED ORDER — ACETAMINOPHEN 160 MG/5ML PO SOLN: 650.0000 mg | ORAL | Status: DC | PRN

## 2020-07-15 MED ORDER — IOHEXOL 350 MG/ML SOLN
100.0000 mL | Freq: Once | INTRAVENOUS | Status: AC | PRN
  Administered 2020-07-15: 100 mL via INTRAVENOUS

## 2020-07-15 NOTE — ED Provider Notes (Signed)
[facility] EMERGENCY DEPARTMENT Provider Note   CSN: [id number] Arrival date & time: 07/15/20  [id number]  An emergency department physician performed an initial assessment on this suspected stroke patient at [phone].  History Chief Complaint  Patient presents with  . Code Stroke    Edwin Wall is a 80 y.o. male. Level 5 caveat due to nonverbal status. HPI Patient presented and had been called as a code stroke prior to me seeing him.  Patient had been normal last night when he went to bed but woke up this morning and wife saw him up and brushing his teeth and ate some food but did not talk to him.  When she did talk to him realized he really could not talk.  Does have a right-sided facial droop.  Not on blood thinners.  Has not had episodes like this before.  Per the wife patient recently saw PCP and was given a clean bill of health.    Past Medical History:  Diagnosis Date  . Anxiety   . Arthritis   . Cataract   . Colon cancer ([place]) dx'd 11/2016   "stage 3"  . Gout   . Gout   . Hemorrhoids   . History of kidney stones    "passed it"  . Hypertension   . Pre-diabetes     Patient Active Problem List   Diagnosis Date Noted  . Cancer of sigmoid colon ([place]) 12/17/2016  . Obesity (BMI 30-39.9) 01/15/2011  . Hypertension 01/15/2011  . History of gout 01/15/2011  . ED (erectile dysfunction) 01/15/2011    Past Surgical History:  Procedure Laterality Date  . COLECTOMY  12/17/2016   lap; partial sigmoid colectomy/notes 12/17/2016  . COLONOSCOPY W/ BIOPSIES AND POLYPECTOMY  11/11/2016  . LAPAROSCOPIC SIGMOID COLECTOMY [name] 12/17/2016   Procedure: LAPAROSCOPIC SIGMOID COLECTOMY;  Surgeon: [name], MD;  Location: [facility];  Service: General;  Laterality: [name];  . NASAL SEPTUM SURGERY    . TONSILLECTOMY         Family History  Problem Relation Age of Onset  . Cancer Mother        LUNG  . COPD Father     Social History   Tobacco Use  . Smoking  status: Never Smoker  . Smokeless tobacco: Never Used  Vaping Use  . Vaping Use: Never used  Substance Use Topics  . Alcohol use: No  . Drug use: No    Home Medications Prior to Admission medications   Medication Sig Start Date End Date Taking? Authorizing Provider  benazepril-hydrochlorthiazide (LOTENSIN HCT) 20-12.5 MG per tablet TAKE 1 TABLET BY MOUTH EVERY DAY 05/18/11   Denita Lung, MD  diclofenac (VOLTAREN) 75 MG EC tablet Take 75 mg by mouth daily as needed (for gout pain).    [provider]  LORazepam (ATIVAN) 1 MG tablet Take 1 mg by mouth daily.    [provider]  oxyCODONE (OXY IR/ROXICODONE) 5 MG immediate release tablet Take 1-2 tablets (5-10 mg total) by mouth every 4 (four) hours as needed for moderate pain. Patient not taking: Reported on 12/29/2016 12/20/16   Fanny Skates, MD    Allergies    Patient has no known allergies.  Review of Systems   Review of Systems  Unable to perform ROS: Patient nonverbal    Physical Exam Updated Vital Signs BP (!) 146/91   Pulse 75   Temp 98 F (36.7 C) (Oral)   Resp 18   Ht 5' 9.5" (  1.765 m)   Wt 112.9 kg   SpO2 97%   BMI 36.24 kg/m   Physical Exam Vitals reviewed.  HENT:     Head: Normocephalic.     Mouth/Throat:     Mouth: Mucous membranes are moist.  Eyes:     Extraocular Movements: Extraocular movements intact.     Pupils: Pupils are equal, round, and reactive to light.  Cardiovascular:     Rate and Rhythm: Normal rate. Rhythm irregular.  Pulmonary:     Breath sounds: No wheezing or rhonchi.  Abdominal:     Tenderness: There is no abdominal tenderness.  Musculoskeletal:        General: No tenderness.     Cervical back: Neck supple.  Skin:    General: Skin is warm.  Neurological:     Mental Status: He is alert.     Comments: Patient is mostly nonverbal.  Appears to have both receptive and expressive aphasia.  Will look to voice.  Moves all extremities but potentially is moving  left more than the right side.  Right-sided facial droop.  Complete NIH scoring done by neurology.     ED Results / Procedures / Treatments   Labs (all labs ordered are listed, but only abnormal results are displayed) Labs Reviewed  COMPREHENSIVE METABOLIC PANEL - Abnormal; Notable for the following components:      Result Value   Glucose, Bld 155 (*)    Creatinine, Ser 1.52 (*)    Total Protein 6.4 (*)    Total Bilirubin 1.9 (*)    GFR, Estimated 46 (*)    All other components within normal limits  CBG MONITORING, ED - Abnormal; Notable for the following components:   Glucose-Capillary 167 (*)    All other components within normal limits  I-STAT CHEM 8, ED - Abnormal; Notable for the following components:   Creatinine, Ser 1.50 (*)    Glucose, Bld 151 (*)    Calcium, Ion 1.14 (*)    All other components within normal limits  SARS CORONAVIRUS 2 (TAT 6-24 HRS)  PROTIME-INR  APTT  CBC  DIFFERENTIAL  CBG MONITORING, ED    EKG EKG Interpretation  Date/Time:  Sunday Jul 15 2020 10:02:22 EDT Ventricular Rate:  83 PR Interval:    QRS Duration: 146 QT Interval:  397 QTC Calculation: 467 R Axis:   -84 Text Interpretation: Atrial fibrillation Right bundle branch block Confirmed by Davonna Belling [phone]) on 07/15/2020 10:14:53 AM   Radiology CT CEREBRAL PERFUSION W CONTRAST  Result Date: 07/15/2020 CLINICAL DATA:  Code stroke with abnormal head CT EXAM: CT ANGIOGRAPHY HEAD AND NECK CT PERFUSION BRAIN TECHNIQUE: Multidetector CT imaging of the head and neck was performed using the standard protocol during bolus administration of intravenous contrast. Multiplanar CT image reconstructions and MIPs were obtained to evaluate the vascular anatomy. Carotid stenosis measurements (when applicable) are obtained utilizing NASCET criteria, using the distal internal carotid diameter as the denominator. Multiphase CT imaging of the brain was performed following IV bolus contrast injection.  Subsequent parametric perfusion maps were calculated using RAPID software. CONTRAST:  166mL OMNIPAQUE IOHEXOL 350 MG/ML SOLN COMPARISON:  Noncontrast CT from earlier today FINDINGS: CTA NECK FINDINGS Aortic arch: 2 vessel arch.  Mild atheromatous plaque Right carotid system: Low-density plaque at the bifurcation without common carotid or ICA stenosis. No ulceration or dissection. Left carotid system: Low-density plaque at the bifurcation without stenosis or ulceration. Vertebral arteries: Subclavian atherosclerosis. Codominant vertebral arteries that are smoothly contoured and widely patent  to the dura. Skeleton: No acute finding. Severe cervical spine degeneration with ridging causing foraminal an cord impingement. Other neck: Negative Upper chest: Negative Review of the MIP images confirms the above findings CTA HEAD FINDINGS Anterior circulation: Mild atheromatous calcification at the siphons. Left M2 branch occlusion correlating with a hyperdensity by CT. No additional branch occlusion is seen. Negative for aneurysm. Posterior circulation: The vertebral and basilar arteries are smooth and widely patent. No branch occlusion, beading, or aneurysm. Venous sinuses: Negative Anatomic variants: Negative Review of the MIP images confirms the above findings CT Brain Perfusion Findings: ASPECTS: 6-7 CBF (<30%) Volume: 31mL Perfusion (Tmax>6.0s) volume: 31mL Mismatch Volume: 29mL Infarction Location:Anterior division left MCA territory Case discussed with Dr. Milas Gain while in progress. IMPRESSION: 1. Left M2 occlusion. CT perfusion reports 26 cc of core infarct with 17 cc of penumbra, but based on noncontrast head CT the penumbra of may be overestimated. 2. Overall mild atherosclerosis for age. No embolic source seen in the neck. Electronically Signed   By: Monte Fantasia M.D.   On: 07/15/2020 09:57   CT HEAD CODE STROKE WO CONTRAST  Result Date: 07/15/2020 CLINICAL DATA:  Code stroke.  Aphasia EXAM: CT HEAD WITHOUT  CONTRAST TECHNIQUE: Contiguous axial images were obtained from the base of the skull through the vertex without intravenous contrast. COMPARISON:  None. FINDINGS: Brain: Cytotoxic edema in the anterior insula, anterior inferior frontal lobe, and high lateral frontal lobe. Borderline involvement of the putamen. No hemorrhage, hydrocephalus, or pre-existing infarct. Cerebral volume loss without specific pattern. Vascular: Dense left M2 branch at the level of infarct. Skull: Negative Sinuses/Orbits: Negative Other: These results were called by telephone at the time of interpretation on 07/15/2020 at 9:36 am to Dr Sal , who verbally acknowledged these results. ASPECTS [facility] Stroke Program Early CT Score) - Ganglionic level infarction (caudate, lentiform nuclei, internal capsule, insula, M1-M3 cortex): 4-5 - Supraganglionic infarction (M4-M6 cortex): 2 Total score (0-10 with 10 being normal): 6-7 IMPRESSION: 1. 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 ASPECTS is 6-7. 2. No acute hemorrhage. Electronically Signed   By: Monte Fantasia M.D.   On: 07/15/2020 09:40   CT ANGIO HEAD CODE STROKE  Result Date: 07/15/2020 CLINICAL DATA:  Code stroke with abnormal head CT EXAM: CT ANGIOGRAPHY HEAD AND NECK CT PERFUSION BRAIN TECHNIQUE: Multidetector CT imaging of the head and neck was performed using the standard protocol during bolus administration of intravenous contrast. Multiplanar CT image reconstructions and MIPs were obtained to evaluate the vascular anatomy. Carotid stenosis measurements (when applicable) are obtained utilizing NASCET criteria, using the distal internal carotid diameter as the denominator. Multiphase CT imaging of the brain was performed following IV bolus contrast injection. Subsequent parametric perfusion maps were calculated using RAPID software. CONTRAST:  143mL OMNIPAQUE IOHEXOL 350 MG/ML SOLN COMPARISON:  Noncontrast CT from earlier today FINDINGS: CTA NECK FINDINGS  Aortic arch: 2 vessel arch.  Mild atheromatous plaque Right carotid system: Low-density plaque at the bifurcation without common carotid or ICA stenosis. No ulceration or dissection. Left carotid system: Low-density plaque at the bifurcation without stenosis or ulceration. Vertebral arteries: Subclavian atherosclerosis. Codominant vertebral arteries that are smoothly contoured and widely patent to the dura. Skeleton: No acute finding. Severe cervical spine degeneration with ridging causing foraminal an cord impingement. Other neck: Negative Upper chest: Negative Review of the MIP images confirms the above findings CTA HEAD FINDINGS Anterior circulation: Mild atheromatous calcification at the siphons. Left M2 branch occlusion correlating with a hyperdensity by  CT. No additional branch occlusion is seen. Negative for aneurysm. Posterior circulation: The vertebral and basilar arteries are smooth and widely patent. No branch occlusion, beading, or aneurysm. Venous sinuses: Negative Anatomic variants: Negative Review of the MIP images confirms the above findings CT Brain Perfusion Findings: ASPECTS: 6-7 CBF (<30%) Volume: 73mL Perfusion (Tmax>6.0s) volume: 48mL Mismatch Volume: 77mL Infarction Location:Anterior division left MCA territory Case discussed with Dr. Milas Gain while in progress. IMPRESSION: 1. Left M2 occlusion. CT perfusion reports 26 cc of core infarct with 17 cc of penumbra, but based on noncontrast head CT the penumbra of may be overestimated. 2. Overall mild atherosclerosis for age. No embolic source seen in the neck. Electronically Signed   By: Monte Fantasia M.D.   On: 07/15/2020 09:57   CT ANGIO NECK CODE STROKE  Result Date: 07/15/2020 CLINICAL DATA:  Code stroke with abnormal head CT EXAM: CT ANGIOGRAPHY HEAD AND NECK CT PERFUSION BRAIN TECHNIQUE: Multidetector CT imaging of the head and neck was performed using the standard protocol during bolus administration of intravenous contrast. Multiplanar CT  image reconstructions and MIPs were obtained to evaluate the vascular anatomy. Carotid stenosis measurements (when applicable) are obtained utilizing NASCET criteria, using the distal internal carotid diameter as the denominator. Multiphase CT imaging of the brain was performed following IV bolus contrast injection. Subsequent parametric perfusion maps were calculated using RAPID software. CONTRAST:  172mL OMNIPAQUE IOHEXOL 350 MG/ML SOLN COMPARISON:  Noncontrast CT from earlier today FINDINGS: CTA NECK FINDINGS Aortic arch: 2 vessel arch.  Mild atheromatous plaque Right carotid system: Low-density plaque at the bifurcation without common carotid or ICA stenosis. No ulceration or dissection. Left carotid system: Low-density plaque at the bifurcation without stenosis or ulceration. Vertebral arteries: Subclavian atherosclerosis. Codominant vertebral arteries that are smoothly contoured and widely patent to the dura. Skeleton: No acute finding. Severe cervical spine degeneration with ridging causing foraminal an cord impingement. Other neck: Negative Upper chest: Negative Review of the MIP images confirms the above findings CTA HEAD FINDINGS Anterior circulation: Mild atheromatous calcification at the siphons. Left M2 branch occlusion correlating with a hyperdensity by CT. No additional branch occlusion is seen. Negative for aneurysm. Posterior circulation: The vertebral and basilar arteries are smooth and widely patent. No branch occlusion, beading, or aneurysm. Venous sinuses: Negative Anatomic variants: Negative Review of the MIP images confirms the above findings CT Brain Perfusion Findings: ASPECTS: 6-7 CBF (<30%) Volume: 80mL Perfusion (Tmax>6.0s) volume: 66mL Mismatch Volume: 64mL Infarction Location:Anterior division left MCA territory Case discussed with Dr. Milas Gain while in progress. IMPRESSION: 1. Left M2 occlusion. CT perfusion reports 26 cc of core infarct with 17 cc of penumbra, but based on noncontrast head  CT the penumbra of may be overestimated. 2. Overall mild atherosclerosis for age. No embolic source seen in the neck. Electronically Signed   By: Monte Fantasia M.D.   On: 07/15/2020 09:57    Procedures Procedures   Medications Ordered in ED Medications  sodium chloride flush (NS) 0.9 % injection 3 mL (3 mLs Intravenous Given 07/15/20 0930)  iohexol (OMNIPAQUE) 350 MG/ML injection 100 mL (100 mLs Intravenous Contrast Given 07/15/20 0945)    ED Course  I have reviewed the triage vital signs and the nursing notes.  Pertinent labs & imaging results that were available during my care of the patient were reviewed by me and considered in my medical decision making (see chart for details).    MDM Rules/Calculators/A&P  Patient presented as a code stroke.  Last normal however was last night.  Initial head CT showed likely stroke that was already completing.  CTA done and showed potentially obstruction.  Perfusion done did not show margin of core for intervention.  Not a tPA candidate due to time of onset and perfusion findings.  Will admit to unassigned medicine.  Initial EKG does show the patient appears to be in atrial fibrillation.  This could be the cause of the stroke.  Discussed with neurology and recommends holding off and anticoagulation for a couple days due to the size of the infarct.  May start aspirin however.  CRITICAL CARE Performed by: Davonna Belling Total critical care time: 30 minutes Critical care time was exclusive of separately billable procedures and treating other patients. Critical care was necessary to treat or prevent imminent or life-threatening deterioration. Critical care was time spent personally by me on the following activities: development of treatment plan with patient and/or surrogate as well as nursing, discussions with consultants, evaluation of patient's response to treatment, examination of patient, obtaining history from patient or  surrogate, ordering and performing treatments and interventions, ordering and review of laboratory studies, ordering and review of radiographic studies, pulse oximetry and re-evaluation of patient's condition.  Final Clinical Impression(s) / ED Diagnoses Final diagnoses:  Cerebrovascular accident (CVA), unspecified mechanism ([place])  Atrial fibrillation, unspecified type [facility])    Rx / DC Orders ED Discharge Orders    None       Davonna Belling, MD 07/15/20 1036

## 2020-07-15 NOTE — ED Notes (Signed)
Patient transported to MRI 

## 2020-07-15 NOTE — Consult Note (Signed)
Neurology consult   CC: code stroke  History is obtained from: EMS, chart and later, wife  HPI: Mr. Demello s a 80 yo male with a PMHx of "pre" DM II, HTN, OA, anxiety, and colorectal cancer s/p laparoscopic sigmoid colectomy in 2018. Patient presents as a code stroke called by EDP on patient's arrival via automobile with wife driving. Patient's LKW 0600 hours. Wife noticed acute onset of difficulty with word finding, dysarthria and facial droop after patient ate breakfast. When patient went to bed the night before, he was in his usual state of health. He was also his normal self when he wokeup this am.   EDP had patient in CT suite, when we arrived. CTH showed acute infarct involving the anterior division of left MCA territory. ASPECTS 6-7. CTA head and neck showed Left M2 occlusion. CT perfusion with 17cc penumbra and 26cc core infarct. Because of completed stroke on imaging the risks outweighed the benefits of tPA and/or IR procedure.   Rhythm in ED is Afib which is new for the patient.   Per chart review, patient gets most of his medical care at [facility]. Last visit to Dr. Melford Aase, PCP in 2/22 showed a LDL of 130 and creatinine of 1.39. He has had other creatinines higher, so suspect he has CKD as well though not mentioned in his notes. In 2018, he was diagnosed with colon cancer and underwent a sigmoid colectomy and was seen in [place] center.   No personal of FMHx of stroke. 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   LKW:  0600  hours tpa given?: No, brain tissue completely infarcted.  IR Thrombectomy?: No, same as above.  MRS 0  NIHSS:  1a Level of Consciousness: 0                    1b LOC Questions: 2 1c LOC Commands: 1 2 Best Gaze: 0 3 Visual: 0 4 Facial Palsy: 1 5a Motor Arm - left: 0 5b Motor Arm - Right: 0 6a Motor Leg - Left: 0 6b Motor Leg - Right: 0 7 Limb Ataxia: 0 8 Sensory: 0 9 Best Language: 2 10 Dysarthria: 2 11  Extinction and Inattention: 1 TOTAL:  9  ROS: A robust ROS was unable to be performed due to emergent nature of event and due to aphasia.   Past Medical History:  Diagnosis Date  . Anxiety   . Arthritis   . Cataract   . Colon cancer ([place]) dx'd 11/2016   "stage 3"  . Gout   . Gout   . Hemorrhoids   . History of kidney stones    "passed it"  . Hypertension   . Pre-diabetes      Family History  Problem Relation Age of Onset  . Cancer Mother        LUNG  . COPD Father     Social History:  reports that he has never smoked. He has never used smokeless tobacco. He reports that he does not drink alcohol and does not use drugs.   Prior to Admission medications   Medication Sig Start Date End Date Taking? Authorizing Provider  benazepril-hydrochlorthiazide (LOTENSIN HCT) 20-12.5 MG per tablet TAKE 1 TABLET BY MOUTH EVERY DAY 05/18/11   Denita Lung, MD  diclofenac (VOLTAREN) 75 MG EC tablet Take 75 mg by mouth daily as needed (for gout pain).    [provider]  LORazepam (ATIVAN) 1 MG  tablet Take 1 mg by mouth daily.    [provider]  oxyCODONE (OXY IR/ROXICODONE) 5 MG immediate release tablet Take 1-2 tablets (5-10 mg total) by mouth every 4 (four) hours as needed for moderate pain. Patient not taking: Reported on 12/29/2016 12/20/16   Fanny Skates, MD    Exam: Current vital signs: BP (!) 146/91   Pulse 75   Temp 98 F (36.7 C) (Oral)   Resp 18   Ht 5' 9.5" (1.765 m)   Wt 112.9 kg   SpO2 97%   BMI 36.24 kg/m   Physical Exam  Constitutional: Appears well-developed and well-nourished.  Psych: Affect appropriate to situation Eyes: No scleral injection HENT: No OP obstrucion Head: Normocephalic.  Cardiovascular: irregularly irregular.  Respiratory: Effort normal  GI: Soft.  No distension. There is no tenderness.  Skin: WDI  Neuro: Mental Status: Patient is awake, alert but he is unable to answer orientation questions due to expressive  aphasia.  Patient is unable to give a clear and coherent history. Seemed to prefer his right side but was able to cross midline and look right to left, left to right.  Speech/Language:  Speech with expressive aphasia and some component of receptive aphasia. With what speech he has, it is dysarthric. He is unable to name objects or repeat phases or words. Comprehension is intact, but slow with exam.    Cranial Nerves: II: Pupils are equal, round, and reactive to light. No field testing performed because patient can not communicate.  III,IV, VI: EOMI without ptosis or diploplia.  V: Facial sensation is symmetric to light touch in V1, V2, and V3 VII: right facial droop.  VIII: hearing is intact to voice. X: Uvula elevates symmetrically. XI: Shoulder shrug is symmetric. XII: tongue is midline without atrophy or fasciculations.  Motor: Can not do complete strength testing due to patient's aphasia.  Tone is normal. Bulk is normal.  Sensory: Sensation is symmetric to noxious stimuli and touch.   Plantars: Toes are downgoing bilaterally.  Cerebellar: Unable to perform due to aphasia.   I have reviewed labs in epic and the pertinent results are: INR   1.1      aPTT  28    Glucose 155    Creat 1.5 (HbA1c 6.6% in 2018)  MD reviewed the images obtained:  NCT head  1. 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 ASPECTS is 6-7. 2. No acute hemorrhage.  MRI brain ordered  CTA head and neck and perfusion 1. Left M2 occlusion. CT perfusion reports 26 cc of core infarct with 17 cc of penumbra, but based on noncontrast head CT the penumbra of may be overestimated. 2. Overall mild atherosclerosis for age. No embolic source seen in the neck.  Assessment: 80 yo male who presented today as a code stroke via automobile driven by his wife. He was normal at bedtime last p.m.  He awoke at 0600 and was his normal self. After eating breakfast, patient had sudden onset of  difficulty getting his words out, dysarthria, and facial droop. NIHSS 9. CTH with left MCA infarct, completed, meaning no brain tissue to save at that point. CTA head and neck showed M2 occlusion. CTP showed 26cc core infarct and 17cc prenumbra, but patient not a candidate for tPA or IR procedure due to completed stroke and interventions at this time would put him at more risks than benefit him. His stroke risk factors are HTN and likely, DM II and HLD. Patient to be  admitted by medicine for stroke workup.   Impression:  1. 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  2. Unable to offer intervention at this point, due to completed stroke and risks of procedures likely not offering improvement in patient's symptoms.   3. New Afib suspicious for embolic source of stroke.   Plan: - Medicine admit.  - MRI brain without contrast. - Recommend TTE no bubble study. - Recommend labs: HbA1c, lipid panel, TSH. - Recommend high intensity statin if LDL > 70 - Aspirin 81mg  daily. - Hold additional antiplatelet therapy and AC for now due to size of stroke.  - SBP goal - Permissive hypertension first 24 h < 220/110. Hold home medications for now. - Telemetry monitoring for arrhythmia. - bedside Swallow screen. - Stroke education. - PT/OT/SLP consult. - NIHSS as per protocol. - frequent neuro checks.  -May need cardiology input for Afib and future AC.  - Recommend metabolic/infectious workup with UA with UCx, CXR, CK, serum lactate.  Dr. Milas Gain updated patient's wife at bedside.   This patient is critically ill and at significant risk of neurological worsening, death and care requires constant monitoring of vital signs, hemodynamics,respiratory and cardiac monitoring, neurological assessment, discussion with family, other specialists and medical decision making of high complexity. I spent ____ minutes of neurocritical care time  in the care of  this patient. This was time spent independent of any time  provided by nurse practitioner or PA.  Electronically signed by: Clance Boll, MSN, APN-BC, nurse practitioner and by MD. Note/plan to be edited by MD as needed.  Pager: [phone]

## 2020-07-15 NOTE — Plan of Care (Signed)
  Problem: Education: Goal: Knowledge of General Education information will improve Description: Including pain rating scale, medication(s)/side effects and non-pharmacologic comfort measures Outcome: Progressing   Problem: Health Behavior/Discharge Planning: Goal: Ability to manage health-related needs will improve Outcome: Progressing   Problem: Clinical Measurements: Goal: Ability to maintain clinical measurements within normal limits will improve Outcome: Progressing Goal: Will remain free from infection Outcome: Progressing Goal: Diagnostic test results will improve Outcome: Progressing Goal: Respiratory complications will improve Outcome: Progressing Goal: Cardiovascular complication will be avoided Outcome: Progressing   Problem: Clinical Measurements: Goal: Cardiovascular complication will be avoided Outcome: Progressing   Problem: Activity: Goal: Risk for activity intolerance will decrease Outcome: Progressing

## 2020-07-15 NOTE — ED Triage Notes (Signed)
Pt arrived via POV.  Wife reports they went to bed at 10pm last night and pt was normal.  He woke up at 6am this morning and she saw him in the bathroom getting ready but didn't speak to him.  Around 7am she found him sitting in a chair and was unable to say her name and answer questions.  States his face was drooping and he wasn't following commands.  She states pt did get dressed to come to hospital.  Pt will follow commands that I show him such as making a fist and holding arms up but does not do them upon verbal commands.  Facial droop present.  Pt unable to name objects.

## 2020-07-15 NOTE — H&P (Addendum)
Date: 07/15/2020               Patient Name:  Edwin Wall MRN: [id number]  DOB: 31-Jul-1940 Age / Sex: 80 y.o., male   PCP: [name], MD         Medical Service: Internal Medicine Teaching Service         Attending Physician: Dr. Campbell Riches, MD    First Contact: Dr. Konrad Penta Pager: [phone]  Second Contact: Dr. Marva Panda  Pager: [phone]       After Hours (After 5p/  First Contact Pager: [phone]  weekends / holidays): Second Contact Pager: [phone]   Chief Complaint: Aphasia   History of Present Illness:   Edwin Wall is a 80 y.o. obese gentleman w/ PMHx sigmoid colon cancer (2018), HTN, Anxiety, ED and gout, who was brought to the ED after his wife noticed acute-onset difficulties with speech and facial droop. She states that he had been in his usual state of health when they went to bed at 10pm and she heard him in the bathroom getting ready as usual; however, after he had breakfast, she noticed he was standing, staring at her, and unable to speak. When he did try, his voice was slurred and he had a facial droop. She denies any history of stroke, irregular heart rhythms, or heart disease. She does note he had been under more stress recently due to his adult son's mental illness (OCD). He had been prescribed Xanax PRN although has taken it every morning recently. He was also recently started on BP medications although recently was told he had the "heart of a young man" at a PCP office visit. Today, he says "no" when asked if he has any complaints including headache, CP, palpitations, SOB, abdominal pain, leg swelling, difficulties urinating or changes in stool. His wife notes that he had previously been active and independent in his ADL's and had been the one to take care of her illnesses.   Home Medications: Current Meds  Medication Sig  . benazepril (LOTENSIN) 40 MG tablet Take 40 mg by mouth daily.  . diclofenac (VOLTAREN) 75 MG EC tablet Take 75 mg by  mouth daily as needed for mild pain.  Marland Kitchen LORazepam (ATIVAN) 1 MG tablet Take 1 mg by mouth daily as needed for anxiety.   Allergies: Allergies as of 07/15/2020  . (No Known Allergies)   Past Medical History:  Diagnosis Date  . Anxiety   . Arthritis   . Cataract   . Colon cancer ([place]) dx'd 11/2016   "stage 3"  . Gout   . Gout   . Hemorrhoids   . History of kidney stones    "passed it"  . Hypertension   . Pre-diabetes     Family History:  Family History  Problem Relation Age of Onset  . Cancer Mother        LUNG  . COPD Father   Brother - heart disease  Grandfather - stroke (attributed to heat)   Social History:  Lives at home with his wife and 30 year old son.  Is independent in ADL's at baseline. Never smoker without hx of alcohol use or illicit drug use.   Review of Systems: A complete ROS was negative except as per HPI.   History and ROS were primarily obtained by patient's wife at the bedside. Hx and ROS limited (level 5 caveat) 2/2 expressive aphasia.   Physical Exam: Blood pressure (!) 145/96, pulse 67, temperature 98 F (  36.7 C), temperature source Oral, resp. rate 16, height 5' 9.5" (1.765 m), weight 112.9 kg, SpO2 97 %.  General: Patient is obese. Resting comfortably in no acute distress.  Eyes: Sclera non-icteric. No conjunctival injection. Full EMOI. PERRLA. No nystagmus.  HENT: MMM. No nasal drainage.  Respiratory: Lungs are CTA anteriorly bilaterally. Patient refused posterior lung examination. Effort and saturations normal on room air.  Cardiovascular: Rate is normal. Rate is irregularly irregular. No murmurs, rubs, or gallops. Distal pulses 2+ in bilateral lower extremities. There is 1+ LE pitting edema bilaterally.  MSK: Bilateral lower legs do not seem tender to palpation. Able to move all four extremities symmetrically.  Neurological: Patient has significant expressive aphasia, only able to consistently answer "no". He follows simple commands about  80% of the time (possible mild receptive aphasia). Patient has a mild left lower facial droop and does not signify whether hearing is equal bilaterally although sensation seems equal bilaterally. He refuses oral examination although CN III-XII are otherwise intact. Strength is 4+/5 in all four extremities. No dysdiadochokinesia.  Abdominal: Soft and non-tender to palpation. Bowel sounds intact. No rebound or guarding. Skin: No lesions. No rashes.  Psych: Patient appears frustrated with speech. Otherwise, pleasant and cooperative.   EKG: personally reviewed my interpretation is Atrial fibrillation at 83 bpm with RBBB.   CT Head wo Contrast:  IMPRESSION: 1. 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 ASPECTS is 6-7. 2. No acute hemorrhage.  CTA Head/Neck:  IMPRESSION: 1. Left M2 occlusion. CT perfusion reports 26 cc of core infarct with 17 cc of penumbra, but based on noncontrast head CT the penumbra of may be overestimated. 2. Overall mild atherosclerosis for age. No embolic source seen in the neck.  Assessment & Plan by Problem: Active Problems:   Acute CVA (cerebrovascular accident) [facility])  # Acute Ischemic CVA  Patient has significant expressive aphasia, mild left-sided facial droop, and likely mild receptive aphasia although strength is equal without pronator drift, bilaterally. Patient is out of tPA window. CTA head/neck with perfusion showed a left M2 occlusion with penumbra with mild atherosclerosis for age and no carotid embolic source. Patient denies any known cardiac disease although is currently in rate-controlled A-Fib with RBBB; both of which appear to be new. Has chronic troubles swallowing although passed his bedside swallow.  - Given ASA 325mg  x 1  - Continue ASA 81mg  daily  - Check TTE  - Check MR brain without contrast - Neurology consulted; appreciate their recommendations  - Frequent Neuro checks  - PT/OT consulted  - SLP consult ordered;  continue NPO until that time  - Fall precautions  - Continuous pulse oximetry   # New Atrial Fibrillation  # New RBBB Initial EKG showed rate-controlled A-Fib with RBBB. On his recent office visit, he was noted to have regular heart sounds. Denies any history of cardiac arrhythmia or disease. CHADs-VASc score is 5 and HASBLED score is 3. He is not on any home anticoagulation and denies any recent bleeding despite dx of colon cancer in the past.  - Will continue ASA 81mg  daily for now  - Check magnesium  - Check TSH  - Check TTE  - Continuous telemetry monitoring  - Would benefit from therapeutic anticoagulation if A-Fib persists   # HTN On arrival, blood pressure was in the 409'W systolic with elevated DBP's. He takes Benzapril 40mg  daily, prescribed recently by his PCP.  - Hold antihypertensives, allowing for permissive HTN  - Continue monitoring   #  Mixed Hyperlipidemia  On most recent outpatient lipid panel, LDL was elevated to 120. He is not taking a statin daily at home.  - Check lipid panel  - Start Rosuvastatin 40mg  daily   # CKD Stage 3, Unspecified whether a or b Creatinine on arrival was 1.5 with GFR 46 which appears to be his baseline renal function. He had been eating well PTA.  - Will get SLP evaluation  - Encourage PO intake if passes swallow exam  - Check morning CMP   # Hyperbilirubinemia  Bilirubin is acutely elevated to 1.9. Patient denies any abdominal pain or hx of liver disease.  - Check fractionated bilirubin  - Check morning CMP  # Hx of Pre-DM Blood glucoses were slightly elevated in the 100's on arrival. Last Hgb A1c was 6.6 in 2018.  - Repeat Hgb A1c  - SSI  # Hx of Sigmoid Colon Cancer  Patient's wife states that he has had 2 colonoscopies since his colectomy and was found to have polyps on both occasions that were removed. He continues to follow closely with his surgeon and gets yearly blood work for tumor markers.  - Monitor for any melena or  BRBPR - Continue follow up outpatient   # Anxiety - Will hold home Lorazepam 1mg  daily for now, pending neurological evaluation   Code Status: Full Code  Diet: NPO until SLP evaluation  IVF: None  DVT PPx: SCD's  Dispo: Admit patient to Inpatient with expected length of stay greater than 2 midnights.  Signed: Jeralyn Bennett, MD 07/15/2020, 1:58 PM  Pager: [phone] After 5pm on weekdays and 1pm on weekends: On Call pager: [phone]

## 2020-07-16 ENCOUNTER — Inpatient Hospital Stay (HOSPITAL_COMMUNITY): Payer: Medicare Other

## 2020-07-16 DIAGNOSIS — I4891 Unspecified atrial fibrillation: Secondary | ICD-10-CM

## 2020-07-16 DIAGNOSIS — I639 Cerebral infarction, unspecified: Secondary | ICD-10-CM | POA: Diagnosis not present

## 2020-07-16 DIAGNOSIS — E1149 Type 2 diabetes mellitus with other diabetic neurological complication: Secondary | ICD-10-CM

## 2020-07-16 DIAGNOSIS — E785 Hyperlipidemia, unspecified: Secondary | ICD-10-CM

## 2020-07-16 DIAGNOSIS — I6389 Other cerebral infarction: Secondary | ICD-10-CM

## 2020-07-16 DIAGNOSIS — G8190 Hemiplegia, unspecified affecting unspecified side: Secondary | ICD-10-CM

## 2020-07-16 DIAGNOSIS — I4510 Unspecified right bundle-branch block: Secondary | ICD-10-CM

## 2020-07-16 DIAGNOSIS — R4701 Aphasia: Secondary | ICD-10-CM

## 2020-07-16 DIAGNOSIS — E119 Type 2 diabetes mellitus without complications: Secondary | ICD-10-CM

## 2020-07-16 LAB — LIPID PANEL
Cholesterol: 179 mg/dL (ref 0–200)
HDL: 34 mg/dL — ABNORMAL LOW (ref >40)
LDL Cholesterol: 127 mg/dL — ABNORMAL HIGH (ref 0–99)
Total CHOL/HDL Ratio: 5.3 RATIO
Triglycerides: 92 mg/dL (ref <150)
VLDL: 18 mg/dL (ref 0–40)

## 2020-07-16 LAB — CBC
HCT: 43.3 % (ref 39.0–52.0)
Hemoglobin: 14 g/dL (ref 13.0–17.0)
MCH: 30.8 pg (ref 26.0–34.0)
MCHC: 32.3 g/dL (ref 30.0–36.0)
MCV: 95.4 fL (ref 80.0–100.0)
Platelets: 199 10*3/uL (ref 150–400)
RBC: 4.54 MIL/uL (ref 4.22–5.81)
RDW: 13 % (ref 11.5–15.5)
WBC: 7.9 10*3/uL (ref 4.0–10.5)
nRBC: 0 % (ref 0.0–0.2)

## 2020-07-16 LAB — COMPREHENSIVE METABOLIC PANEL
ALT: 14 U/L (ref 0–44)
AST: 17 U/L (ref 15–41)
Albumin: 3.3 g/dL — ABNORMAL LOW (ref 3.5–5.0)
Alkaline Phosphatase: 57 U/L (ref 38–126)
Anion gap: 9 (ref 5–15)
BUN: 17 mg/dL (ref 8–23)
CO2: 24 mmol/L (ref 22–32)
Calcium: 9.2 mg/dL (ref 8.9–10.3)
Chloride: 107 mmol/L (ref 98–111)
Creatinine, Ser: 1.54 mg/dL — ABNORMAL HIGH (ref 0.61–1.24)
GFR, Estimated: 46 mL/min — ABNORMAL LOW (ref >60)
Glucose, Bld: 143 mg/dL — ABNORMAL HIGH (ref 70–99)
Potassium: 3.9 mmol/L (ref 3.5–5.1)
Sodium: 140 mmol/L (ref 135–145)
Total Bilirubin: 2 mg/dL — ABNORMAL HIGH (ref 0.3–1.2)
Total Protein: 6.2 g/dL — ABNORMAL LOW (ref 6.5–8.1)

## 2020-07-16 LAB — HEMOGLOBIN A1C
Hgb A1c MFr Bld: 7.1 % — ABNORMAL HIGH (ref 4.8–5.6)
Mean Plasma Glucose: 157.07 mg/dL

## 2020-07-16 LAB — GLUCOSE, CAPILLARY
Glucose-Capillary: 127 mg/dL — ABNORMAL HIGH (ref 70–99)
Glucose-Capillary: 150 mg/dL — ABNORMAL HIGH (ref 70–99)
Glucose-Capillary: 116 mg/dL — ABNORMAL HIGH (ref 70–99)
Glucose-Capillary: 150 mg/dL — ABNORMAL HIGH (ref 70–99)

## 2020-07-16 LAB — TSH: TSH: 0.966 u[IU]/mL (ref 0.350–4.500)

## 2020-07-16 MED ORDER — PERFLUTREN LIPID MICROSPHERE
1.0000 mL | INTRAVENOUS | Status: AC | PRN
  Administered 2020-07-16: 2 mL via INTRAVENOUS
  Filled 2020-07-16: qty 10

## 2020-07-16 MED ORDER — ASPIRIN 300 MG RE SUPP
300.0000 mg | Freq: Every day | RECTAL | Status: DC
  Administered 2020-07-16: 300 mg via RECTAL
  Filled 2020-07-16: qty 1

## 2020-07-16 MED ORDER — ASPIRIN EC 325 MG PO TBEC
325.0000 mg | DELAYED_RELEASE_TABLET | Freq: Every day | ORAL | Status: DC
  Administered 2020-07-17 – 2020-07-18 (×2): 325 mg via ORAL
  Filled 2020-07-16 (×2): qty 1

## 2020-07-16 MED ORDER — SODIUM CHLORIDE 0.9 % IV SOLN: INTRAVENOUS | Status: DC

## 2020-07-16 MED ORDER — LORAZEPAM 1 MG PO TABS
1.0000 mg | ORAL_TABLET | Freq: Every day | ORAL | Status: DC
  Administered 2020-07-16 – 2020-07-18 (×3): 1 mg via ORAL
  Filled 2020-07-16 (×3): qty 1

## 2020-07-16 NOTE — Progress Notes (Addendum)
Subjective:   [name] reports he's doing much better today. His ability to speak has improved and he now recalls his wife's name. He endorses equal right arm and right leg weakness, although says both have improved since yesterday. He denies any trouble in understanding others. He understands the plan for his continued workup in the hospital and was surprised to hear the news that his cholesterol is high and that he has new-onset diabetes. He does follow up regularly with his PCP.   Objective:  Vital signs in last 24 hours: Vitals:   07/15/20 2309 07/16/20 0417 07/16/20 0825 07/16/20 1149  BP: (!) 158/92 (!) 153/92 (!) 151/87 (!) 156/96  Pulse: 85 73 61 (!) 58  Resp: 18 16 18 20   Temp: 98.6 F (37 C) 98.1 F (36.7 C) 98.4 F (36.9 C) 97.7 F (36.5 C)  TempSrc: Oral Oral Oral Oral  SpO2: 94% 97% 97% 96%  Weight:      Height:       General: Patient is obese. Resting comfortably in no acute distress.  Eyes: Sclera non-icteric. No conjunctival injection. Appears to have some troubles with full right-ward gaze although extraocular movements are otherwise fully intact. No nystagmus.  HENT: Slightly dry mucus membranes.   Respiratory: Lungs are CTA anteriorly bilaterally. Effort and saturations normal on room air.  Cardiovascular: Rate is normal. Rate is irregularly irregular. No murmurs, rubs, or gallops. There is trace bilateral LE pitting edema.  MSK: RUE and LLE strength are 4+/5. RLE strength is 4/5. LUE strength is 5/5. Normal muscle bulk. Bilateral lower legs do not seem tender to palpation.  Neurological: Continues to have expressive aphasia, although significantly improved since yesterday. He follows simple commands about 90% of the time (possible mild receptive aphasia). He no longer has a facial droop. CN III-XII are intact aside from possible difficulties with bilateral right-ward gaze.  Psych: Pleasant and cooperative.  Assessment/Plan:  Active Problems:   Acute CVA  (cerebrovascular accident) ([place])   Hyperlipidemia   Type II diabetes mellitus ([place])   Atrial fibrillation ([place])  # Acute Left MCA Infarction # Aphasia (Expressive > Receptive), Improving  # Mild Hemiplegia, Improving  MRI confirmed a 6.8 x 3.2 x 5.5cm acute cortical / subcortical infarct over the left frontal operculum, anterolateral left frontal lobe and left insula without any evidence of hemorrhage. SLP evaluated patient and found him to have moderate aphasia with both an expressive and receptive component, only following about 20% of commands. However, on my examination, both his expressive aphasia and possible receptive aphasia significantly improved and RUE and RLE weakness slightly improved since yesterday with resolution of his facial droop. He does have underlying HLD and new-onset DM that had not been treated in addition to HTN, all likely contributing to his stroke; however, his acute infarct is most likely embolic in nature due to newly diagnosed A-Fib as below.  - Neurology following; appreciate their assistance  - Switched diet to CM/HH diet given intact swallowing function  - SLP recommending continued intensive SLP evaluation + CIR if otherwise appropriate - TOC consult placed for CIR - Awaiting PT/OT evaluations  - TTE is pending  - Continue ASA 325mg  daily for now  - Continue fall precautions with frequent neuro checks   # New Atrial Fibrillation  # New RBBB Initial EKG in the ED showed a new RBBB and he continues to be in rate-controlled A-Fib since admission, which is new. TSH normal. CHADs-VASc score is 5 and HASBLED score is  3. He is not on any home anticoagulation and denies any recent bleeding despite dx of colon cancer in 2018 with recurrent polyps since.  - TTE pending  - Will start Eliquis in 5-7 days (5/20-5/22) for future stroke prevention, holding off acutely due to large size / high risk infarct - Continuous telemetry monitoring   # HTN Blood pressures  remain persistently elevated in the 150's/90's. He takes Benzapril 40mg  daily, prescribed recently by his PCP although this has been held in the setting of stroke.  - Will continue to hold antihypertensives for today - Plan to start back on lower dose ARB tomorrow for goal of slow BP normalization over 3-5 days - Continue monitoring   # Hyperlipidemia  Patient has had a history of untreated HLD and lipid panel here confirmed LDL above goal at 127. - Continue Rosuvastatin 40mg  daily  - Will require outpatient monitoring for goal LDL <70  # Hyperbilirubinemia  Bilirubin continues to trend upward to 2.0 with normal direct bilirubin. This may be due to hemolysis in the setting of acute occlusion given normal bilirubin in the past vs. Underlying Gilbert's Syndrome.  - Continue to trend daily CMP  # New-Onset DM  Very likely T2DM. Hgb A1c this admission increased from 6.6 in 2018 to 7.1% this admission. He is not on any glucose-lowering agents at home. Sugars remain controlled on moderate SSI.  - Continue moderate SSI while hospitalized  - Plan to start Metformin upon discharge with close outpatient follow up - Continue to monitor CBG's  # Anxiety Patient takes Lorazepam 1mg  once daily at home for anxiety that has been exacerbated recently.  - Will restart this today - Recommend trial of SSRI outpatient instead  # CKD Stage 3, Unspecified whether a or b Renal function remains stable and at patient's baseline.  - Continue to monitor on CMP  Code Status: Full Code  Diet: CM/HH IVF: None  DVT PPx: SCD's Prior to Admission Living Arrangement: Home Anticipated Discharge Location: CIR  Barriers to Discharge: Ongoing CVA workup and placement   Jeralyn Bennett, MD 07/16/2020, 2:08 PM Pager: [phone] After 5pm on weekdays and 1pm on weekends: On Call pager [phone]

## 2020-07-16 NOTE — Hospital Course (Addendum)
#   Hx of Sigmoid Colon Cancer  Patient's wife states that he has had 2 colonoscopies since his colectomy and was found to have polyps on both occasions that were removed. He continues to follow closely with his surgeon and gets yearly blood work for tumor markers.  - Monitor for any melena or BRBPR - Continue follow up outpatient   # Acute Left MCA Infarction # Expressive and Receptive Aphasia  # Mild Hemiplegia and facial droop, Improving  MRI yesterday showed a 6.8 x 3.2 x 5.5cm acute cortical / subcortical infarct over the left frontal operculum, anterolateral left frontal lobe and left insula without any evidence of hemorrhage. SLP evaluated patient and found him to have moderate aphasia with both an expressive and receptive component, only following about 20% of commands. However, on my examination, both his expressive aphasia and possible receptive aphasia significantly improved and RUE and RLE weakness slightly improved since yesterday with resolution of his facial droop. He does have underlying HLD and new-onset DM that had not been treated in addition to HTN, all likely contributing to his stroke; however, his acute infarct is most likely embolic in nature due to newly diagnosed A-Fib as below.  - Neurology following; appreciate their assistance  - Switched diet to CM/HH diet given intact swallowing function  - SLP recommending continued intensive SLP evaluation + CIR if otherwise appropriate - TOC consult placed for CIR - Awaiting PT/OT evaluations  - TTE is pending  - Continue ASA 325mg  daily for now  - Continue fall precautions with frequent neuro checks

## 2020-07-16 NOTE — Progress Notes (Addendum)
STROKE TEAM PROGRESS NOTE   INTERVAL HISTORY No acute events since arrival.  Today Mr. Sandlin is resting in bed with his wife at the bedside. He remains with receptive and expressive aphasia. He is hemodynamically and neurologically stable. Wife reports patient has been able to get some words out this morning and is trying to speak often. He denies any new problems with head shake. Obviously frustrated with speech difficulty.  We discussed his stroke diagnosis, work up and plan of care including need to start anticoagulation for new dx of atrial fibrillation when deemed medically appropriate. Wife assures me patient is very reliable with taking medications and she will assist/ensure he is taking medication appropriately. She does not recall any bleeding episodes since his colon cancer surgery 3 years ago. We discussed ongoing therapy evaluations and possible discharge plans. Her questions were answered.   Vitals:   07/15/20 1707 07/15/20 1958 07/15/20 2309 07/16/20 0417  BP: (!) 153/95 (!) 149/98 (!) 158/92 (!) 153/92  Pulse: 69 79 85 73  Resp: 18  18 16   Temp: 97.6 F (36.4 C) 98.6 F (37 C) 98.6 F (37 C) 98.1 F (36.7 C)  TempSrc: Oral Oral Oral Oral  SpO2: 99% 98% 94% 97%  Weight:      Height:       CBC:  Recent Labs  Lab 07/15/20 0920 07/15/20 0927 07/16/20 0337  WBC 6.8  --  7.9  NEUTROABS 4.5  --   --   HGB 14.3 14.3 14.0  HCT 44.1 42.0 43.3  MCV 96.9  --  95.4  PLT 188  --  644   Basic Metabolic Panel:  Recent Labs  Lab 07/15/20 0920 07/15/20 0927 07/15/20 1718 07/16/20 0337  NA 141 143  --  140  K 3.8 3.9  --  3.9  CL 108 109  --  107  CO2 24  --   --  24  GLUCOSE 155* 151*  --  143*  BUN 18 21  --  17  CREATININE 1.52* 1.50*  --  1.54*  CALCIUM 9.2  --   --  9.2  MG  --   --  1.9  --    Lipid Panel:  Recent Labs  Lab 07/16/20 0337  CHOL 179  TRIG 92  HDL 34*  CHOLHDL 5.3  VLDL 18  LDLCALC 127*   HgbA1c:  Recent Labs  Lab 07/16/20 0337   HGBA1C 7.1*   Urine Drug Screen: No results for input(s): LABOPIA, COCAINSCRNUR, LABBENZ, AMPHETMU, THCU, LABBARB in the last 168 hours.  Alcohol Level No results for input(s): ETH in the last 168 hours.  IMAGING past 24 hours CT Head wo Contrast:  IMPRESSION: 1. 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 ASPECTS is 6-7. 2. No acute hemorrhage.  CTA Head/Neck:  IMPRESSION: 1. Left M2 occlusion. CT perfusion reports 26 cc of core infarct with 17 cc of penumbra, but based on noncontrast head CT the penumbra of may be overestimated. 2. Overall mild atherosclerosis for age. No embolic source seen in the neck.  PHYSICAL EXAM Constitutional: Appears well-developed and well-nourished.  Psych: Affect appropriate to situation Eyes: No scleral injection HENT: No OP obstrucion Head: Normocephalic.  Cardiovascular: irregularly irregular.  Respiratory: Effort normal  GI: Soft.  No distension. There is no tenderness.  Skin: WDI  Neuro: Mental Status: Patient is awake, alert. Tracking examiner, focusing and attentive. Both receptive and expressive aphasia are apparent. He follows some multistep examx but does not appear  to comprehend some of the time. Unable to state name but shows me his ID bracelet. Able to state wife's name. He is able to repeat 3 word phrase and 5 word sentence with some errors but clear speech. Naming 0/3. Some social greetings "hi, ok" are spontaneous. He mimics effectively for exam.  Seemed to prefer his right side but was able to cross midline and look right to left, left to right.  Cranial Nerves: II: Pupils are equal, round, and reactive to ligh.   No field testing performed because patient can not communicate.  III,IV, VI: EOMI without ptosis or diploplia.  V: Facial sensation is symmetric to light touch in V1, V2, and V3 VII: right facial droop.  VIII: hearing is intact to voice. X: Uvula elevates symmetrically. XI: Shoulder shrug is  symmetric. XII: tongue is midline without atrophy or fasciculations.  Motor: Moving all 4 extremities spontaneously and purposefully. Right grip is 4/5. RLE does not follow commands for formal testing. LUE grip is 5/5. LLE does not follow commands for formal testing.  Tone is normal. Bulk is normal.  Sensory: Sensation is symmetric to noxious stimuli and touch.   Plantars: Toes are downgoing bilaterally.  Cerebellar: Right UE with impaired coordination noted with decreased speed of arm roll and RAMs. LUE no impairment on arm roll and RAMs. Bilat LE assessment appeared impaired by body habitus but he did attempt to mimic.  No pronator drft  ASSESSMENT/PLAN A80M with preDM2, HTN, OA, anxiety, went to bed at 2100 on 07/14/20, woke up at 0600 and was in the bathroom. Came out for breakfast and wife noted speech difficulty and R facial droop. Stroke code activated in the ED. [place] with a completed anterior division L MCA stroke with an ASPECTs of 6 with clear loss of grey white differentiation. CTA H + N with a LMCA M2 occlusion. CTP shows a core of 26 and mismatch of 17. NIHSS 9. Admitting neurologist, Dr. Lorrin Goodell, advised appearance of the stroke on [facility] was likely a completed stroke and would not be beneficial to pursue tPA or thrombectomy. No intervention was pursued. 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  Code Stroke:  Acute infarct involving the anterior division left MCA territory.  CTA head & neck: Left M2 occlusion. CT perfusion reports 26 cc of core infarct with 17 cc of penumbra (?overestimated)  MRI: .8 x 3.2 x 5.5 cm acute cortical/subcortical infarct affecting the left frontal operculum, anterolateral left frontal lobe and left insula (MCA vascular territory)  2D Echo: complete with read pending  LDL 127  HgbA1c 7.1  VTE  prophylaxis -per primary team  No antithrombotics PTA, currently on ASA 325. Consider eliquis 5-7 days post stroke for stroke prevention.  Therapy recommendations: TBD  Disposition:  TBD  New diagnosed atrial fibrillation  Noted in ED, rate 80s  CHADs-VASc score is 5 and HASBLED score is 3. No recent bleeding in setting of colon cancer history   On ASA now  Will need Eliquis in 5-7 days (5/20-5/22) for stroke prevention  Hypertension  Stable . Gradually normalize in 3-5 days . Long-term BP goal normotensive  Hyperlipidemia  Home meds: None  LDL 127, goal < 70  High intensity statin: Crestor 40mg  on board   Continue statin at discharge  Diabetes type II Uncontrolled  No home  meds   HgbA1c 7.1, almost at goal < 7.0  CBGs  SSI  Management per primary team   Other Stroke Risk Factors  Advanced Age >/= 46   Obesity, Body mass index is 36.24 kg/m., BMI >/= 30 associated with increased stroke risk, recommend weight loss, diet and exercise as appropriate   High risk for obstructive sleep apnea  Other Active Problems    Hospital day # 1 Delila A Bailey-Modzik, NP-C, MSN   ATTENDING NOTE: I reviewed above note and agree with the assessment and plan. Pt was seen and examined.   80 year old male with history of diabetes, hypertension, colon cancer status post surgery in 2018 admitted for aphasia, right facial droop.  CT showed left frontal infarct.  CTA head and neck left M2 occlusion.  CT perfusion showed core infarct 26 cc and penumbra 43 cc.  Given likely pseudonormalization, not much tissue to save, no IR offered.  MRI confirmed left MCA moderate sized infarct.  A1c 7.1, LDL 127.  2D echo pending.  Patient was found to have A. fib in ER which confirmed on EKG.  Creatinine 1.54  On exam, patient awake alert, wife at bedside.  Still has global aphasia, able to repeat 2-3 words but not sentences.  Not able to name, not able to follow simple commands however  able to mimic requests.  No gaze palsy, blinking to visual threat bilaterally, mild right facial droop, tongue midline.  Bilateral upper and lower extremity motor strengths equal symmetrical, lateral finger-to-nose intact.  Sensation seems symmetrical, however not corporative due to language deficit.  Gait not tested.  Etiology for patient stroke likely due to new diagnosis of A. fib.  Currently on aspirin 325 versus aspirin PR 300.  Patient will need transition to Eliquis in 5 to 7 days post stroke for further stroke prevention.  On Crestor 40, continue on discharge.  Continue speech therapy for aphasia.  For detailed assessment and plan, please refer to above as I have made changes wherever appropriate.   Neurology will sign off. Please call with questions. Pt will follow up with stroke clinic NP at [facility] in about 4 weeks. Thanks for the consult.   Rosalin Hawking, MD PhD Stroke Neurology 07/16/2020 1:43 PM     To contact Stroke Continuity provider, please refer to [email or web address] After hours, contact General Neurology

## 2020-07-16 NOTE — Plan of Care (Signed)

## 2020-07-16 NOTE — Plan of Care (Signed)
No acute events Neuro status intact Mg and K supplemented IV Pt slept well Will continue to monitor and follow poc   Problem: Education: Goal: Knowledge of General Education information will improve Description: Including pain rating scale, medication(s)/side effects and non-pharmacologic comfort measures Outcome: Progressing   Problem: Health Behavior/Discharge Planning: Goal: Ability to manage health-related needs will improve Outcome: Progressing   Problem: Clinical Measurements: Goal: Ability to maintain clinical measurements within normal limits will improve Outcome: Progressing Goal: Will remain free from infection Outcome: Progressing Goal: Diagnostic test results will improve Outcome: Progressing Goal: Respiratory complications will improve Outcome: Progressing Goal: Cardiovascular complication will be avoided Outcome: Progressing   Problem: Activity: Goal: Risk for activity intolerance will decrease Outcome: Progressing   Problem: Nutrition: Goal: Adequate nutrition will be maintained Outcome: Progressing   Problem: Coping: Goal: Level of anxiety will decrease Outcome: Progressing   Problem: Elimination: Goal: Will not experience complications related to bowel motility Outcome: Progressing Goal: Will not experience complications related to urinary retention Outcome: Progressing   Problem: Pain Managment: Goal: General experience of comfort will improve Outcome: Progressing   Problem: Safety: Goal: Ability to remain free from injury will improve Outcome: Progressing   Problem: Skin Integrity: Goal: Risk for impaired skin integrity will decrease Outcome: Progressing   Problem: Education: Goal: Knowledge of disease or condition will improve 07/16/2020 0438 by Emilie Rutter, RN Outcome: Progressing 07/16/2020 0124 by Emilie Rutter, RN Outcome: Not Progressing

## 2020-07-16 NOTE — Evaluation (Signed)
Physical Therapy Evaluation Patient Details Name: Edwin Wall MRN: [id number] DOB: 08-08-1940 Today'Wall Date: 07/16/2020   History of Present Illness  80 yo male presents to the ED on 5/25 with speech difficutlies and facial droop. 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 PMH including anxiety, arthritis, colon cancer (dx 2018), HTN, obesity, and pre-daibetes.  Clinical Impression   Pt presents with impaired cognition, aphasia, generalized weakness, impaired balance, and decreased activity tolerance vs baseline. Pt to benefit from acute PT to address deficits. Pt ambulated hallway distance with close guard for safety and intermittent steadying assist for LOB, pt is highly distractible in hallway. Pt also incontinent of both stool and urine in hallway, requiring pericare assist of PT and OT. PT recommending CIR post-acutely to address deficits. PT to progress mobility as tolerated, and will continue to follow acutely.      Follow Up Recommendations CIR    Equipment Recommendations  None recommended by PT    Recommendations for Other Services       Precautions / Restrictions Precautions Precautions: Fall Restrictions Weight Bearing Restrictions: No      Mobility  Bed Mobility Overal bed mobility: Needs Assistance Bed Mobility: Supine to Sit     Supine to sit: Min guard     General bed mobility comments: Min Guard A for safety    Transfers Overall transfer level: Needs assistance Equipment used: None Transfers: Sit to/from Stand Sit to Stand: Min assist         General transfer comment: Min Guard-Min A for balance in standing pending transer surface. Pt initially standing from EOB impulsively despite cues to wait multiple times  Ambulation/Gait Ambulation/Gait assistance: Min assist Gait Distance (Feet): 100 Feet (+50) Assistive device: None Gait Pattern/deviations:  Step-through pattern;Drifts right/left Gait velocity: decr   General Gait Details: min assist to steady, x1 LOB when pt encountered unexpected object in hallway requiring PT assist to correct. Standing break to cue pt to navigate back to room, unable to do so without significant cuing. Pt incontinent of stool and urine in hallway.  Stairs            Wheelchair Mobility    Modified Rankin (Stroke Patients Only) Modified Rankin (Stroke Patients Only) Pre-Morbid Rankin Score: No symptoms Modified Rankin: Moderately severe disability     Balance Overall balance assessment: Needs assistance Sitting-balance support: No upper extremity supported;Feet supported Sitting balance-Leahy Scale: Good Sitting balance - Comments: able to anteriorly translate trunk to don socks   Standing balance support: No upper extremity supported;During functional activity Standing balance-Leahy Scale: Fair Standing balance comment: cannot accept challenge                             Pertinent Vitals/Pain Pain Assessment: Faces Faces Pain Scale: No hurt Pain Intervention(Wall): Monitored during session    Home Living Family/patient expects to be discharged to:: Private residence Living Arrangements: Spouse/significant other;Children Available Help at Discharge: Family;Available 24 hours/day Type of Home: House Home Access: Stairs to enter Entrance Stairs-Rails: Right;Left;Can reach both Entrance Stairs-Number of Steps: 3-4 Home Layout: One level Home Equipment: Shower seat      Prior Function Level of Independence: Independent         Comments: ADLs, IADLs, and driving     Hand Dominance   Dominant Hand: Left    Extremity/Trunk Assessment   Upper Extremity Assessment Upper Extremity Assessment: Defer to  OT evaluation    Lower Extremity Assessment Lower Extremity Assessment: Generalized weakness    Cervical / Trunk Assessment Cervical / Trunk Assessment: Kyphotic   Communication   Communication: Receptive difficulties;Expressive difficulties  Cognition Arousal/Alertness: Awake/alert Behavior During Therapy: WFL for tasks assessed/performed Overall Cognitive Status: Difficult to assess Area of Impairment: Attention;Awareness;Problem solving;Following commands;Safety/judgement;Memory;Orientation                 Orientation Level: Disoriented to;Place;Situation Current Attention Level: Sustained;Selective Memory: Decreased short-term memory Following Commands: Follows one step commands inconsistently;Follows one step commands with increased time Safety/Judgement: Decreased awareness of safety;Decreased awareness of deficits Awareness: Intellectual Problem Solving: Slow processing;Requires verbal cues;Requires tactile cues General Comments: Pt with decreased awareness of safety and slightly impulsive. Pt requiring increased time and cues throughout. Pt unaware of bowel incontenience. Distracted by external environment. Pt responds "yes" inappropriately to several questions, including "are you in [employer]", "are you at a church"      General Comments General comments (skin integrity, edema, etc.): wife present during session    Exercises     Assessment/Plan    PT Assessment Patient needs continued PT services  PT Problem List Decreased strength;Decreased mobility;Decreased activity tolerance;Decreased balance;Decreased knowledge of use of DME;Decreased coordination;Decreased safety awareness;Obesity;Decreased cognition       PT Treatment Interventions DME instruction;Therapeutic activities;Gait training;Therapeutic exercise;Patient/family education;Balance training;Functional mobility training;Neuromuscular re-education    PT Goals (Current goals can be found in the Care Plan section)  Acute Rehab PT Goals Patient Stated Goal: Go home safely PT Goal Formulation: With patient/family Time For Goal Achievement: 07/30/20 Potential to  Achieve Goals: Good    Frequency Min 4X/week   Barriers to discharge        Co-evaluation   Reason for Co-Treatment: For patient/therapist safety;To address functional/ADL transfers   OT goals addressed during session: ADL'Wall and self-care       AM-PAC PT "6 Clicks" Mobility  Outcome Measure Help needed turning from your back to your side while in a flat bed without using bedrails?: A Little Help needed moving from lying on your back to sitting on the side of a flat bed without using bedrails?: A Little Help needed moving to and from a bed to a chair (including a wheelchair)?: A Little Help needed standing up from a chair using your arms (e.g., wheelchair or bedside chair)?: A Little Help needed to walk in hospital room?: A Little Help needed climbing 3-5 steps with a railing? : A Lot 6 Click Score: 17    End of Session Equipment Utilized During Treatment: Gait belt Activity Tolerance: Patient tolerated treatment well;Patient limited by fatigue Patient left: in chair;with call bell/phone within reach;with family/visitor present;with chair alarm set Nurse Communication: Mobility status PT Visit Diagnosis: Other abnormalities of gait and mobility (R26.89);Other symptoms and signs involving the nervous system (R29.898)    Time: 1025-8527 PT Time Calculation (min) (ACUTE ONLY): 28 min   Charges:   PT Evaluation $PT Eval Low Complexity: 1 Low          Edwin Wall, PT DPT Acute Rehabilitation Services Pager [phone]  Office [phone]   Louis Matte 07/16/2020, 4:42 PM

## 2020-07-16 NOTE — Evaluation (Signed)
Speech Language Pathology Evaluation Patient Details Name: Edwin Wall MRN: [id number] DOB: [birth year] Today's Date: 07/16/2020 Time: 1040-1130 SLP Time Calculation (min) (ACUTE ONLY): 50 min  Problem List:  Patient Active Problem List   Diagnosis Date Noted  . Acute CVA (cerebrovascular accident) ([place]) 07/15/2020  . Cancer of sigmoid colon ([place]) 12/17/2016  . Obesity (BMI 30-39.9) 01/15/2011  . Hypertension 01/15/2011  . History of gout 01/15/2011  . ED (erectile dysfunction) 01/15/2011   Past Medical History:  Past Medical History:  Diagnosis Date  . Anxiety   . Arthritis   . Cataract   . Colon cancer ([place]) dx'd 11/2016   "stage 3"  . Gout   . Gout   . Hemorrhoids   . History of kidney stones    "passed it"  . Hypertension   . Pre-diabetes    Past Surgical History:  Past Surgical History:  Procedure Laterality Date  . COLECTOMY  12/17/2016   lap; partial sigmoid colectomy/notes 12/17/2016  . COLONOSCOPY W/ BIOPSIES AND POLYPECTOMY  11/11/2016  . LAPAROSCOPIC SIGMOID COLECTOMY [name] 12/17/2016   Procedure: LAPAROSCOPIC SIGMOID COLECTOMY;  Surgeon: [name], MD;  Location: [place];  Service: General;  Laterality: [name];  . NASAL SEPTUM SURGERY    . TONSILLECTOMY     HPI:  Mr. Kulikowski s a 80 yo male arriving with acute onset of difficulty with word finding, dysarthria and facial droop after patient ate breakfast. 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   Assessment / Plan / Recommendation Clinical Impression  Pt demonstrates moderate aphasia with receptive and expressive impairment. Pt has decreased awareness of expressive errors, which consist of paraphasias and perseverations with semi fluent  agrammatic speech. Pt is able to repeat quite fluently until linguistic complexity increases signfiicantly. Pt responds well to a variety of cueing techniques and can be assisted to express wants and needs. Direct teaching provided to wife. Receptive ability is quite impaired; pt will respond y/n with about 60% accuracy to basic questions without awareness. He does not say "I dont know" if he does not comprehend and so appears to follow complex conversation when he does not. He has about 20% accuracy in following commands if no context is given (such as looking at his leg when asking him to move it, etc). Pt will benefit from intensive SLP intervention, suggest CIR if otherwise appropriate.    SLP Assessment  SLP Recommendation/Assessment: Patient needs continued Speech Lanaguage Pathology Services SLP Visit Diagnosis: Aphasia (R47.01)    Follow Up Recommendations  Inpatient Rehab    Frequency and Duration min 2x/week  2 weeks      SLP Evaluation Cognition  Overall Cognitive Status: Difficult to assess Arousal/Alertness: Awake/alert Orientation Level: Oriented to person;Oriented to place Attention: Sustained;Alternating Sustained Attention: Appears intact Alternating Attention: Appears intact Memory:  (UTA) Awareness: Impaired Awareness Impairment: Emergent impairment Problem Solving: Appears intact Safety/Judgment: Appears intact       Comprehension  Auditory Comprehension Overall Auditory Comprehension: Impaired Yes/No Questions: Impaired Basic Biographical Questions: 51-75% accurate Basic Immediate Environment Questions: 50-74% accurate Complex Questions: 50-74% accurate Commands: Impaired One Step Basic Commands: 0-24% accurate Conversation: Simple Reading Comprehension Reading Status: Impaired Word level: Impaired Sentence Level: Impaired Paragraph Level: Impaired    Expression Verbal Expression  Overall Verbal Expression: Impaired Initiation: No  impairment Automatic Speech: Name;Social Response;Counting;Day of week Level of Generative/Spontaneous Verbalization: Word;Phrase Repetition: Impaired Level of Impairment: Sentence level Naming: Impairment Responsive: Not tested Confrontation: Impaired Convergent: Not tested Divergent: Not tested Verbal Errors: Semantic paraphasias;Perseveration;Phonemic paraphasias;Not aware of errors   Oral / Motor  Oral Motor/Sensory Function Overall Oral Motor/Sensory Function: Within functional limits Motor Speech Overall Motor Speech: Appears within functional limits for tasks assessed   GO                    Lil Lepage, Katherene Ponto 07/16/2020, 11:53 AM

## 2020-07-16 NOTE — Progress Notes (Signed)
OT Cancellation Note  Patient Details Name: Edwin Wall MRN: [id number] DOB: [birth year]   Cancelled Treatment:    Reason Eval/Treat Not Completed: Other (comment) (Md in room at this time. Will return as schedule allows.)  [place], OTR/L Acute Rehab Pager: [phone] Office: [phone] 07/16/2020, 10:55 AM

## 2020-07-16 NOTE — Progress Notes (Signed)
Inpatient Rehab Admissions Coordinator Note:   Per PT/OT/ST recommendations, pt was screened for CIR candidacy by Gayland Curry, MS, CCC-SLP.  At this time we are recommending an inpatient rehab consult. Please place an IP Rehab MD consult if pt would like to be considered.  Please contact me with questions.    Gayland Curry, MS, CCC-SLP Admissions Coordinator [phone] 07/16/20 4:53 PM

## 2020-07-16 NOTE — Evaluation (Signed)
Occupational Therapy Evaluation Patient Details Name: Edwin Wall MRN: [id number] DOB: 1940/04/23 Today's Date: 07/16/2020    History of Present Illness 80 yo male presents to the ED on 5/25 with speech difficutlies and facial droop. 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 PMH including anxiety, arthritis, colon cancer (dx 2018), HTN, obesity, and pre-daibetes.   Clinical Impression   PTA, pt was living with his wife and was independent. Pt currently requiring Min A for bathing/dressing, Mod A for toileting, and Min A for functional mobility. Pt presenting with aphasia and poor cognition including attention, memory, awareness, and problem solving. Pt highly motivated to participate in therapy. Pt would benefit from further acute OT to facilitate safe dc. Recommend dc to CIR for further OT to optimize safety, independence with ADLs, and return to PLOF.     Follow Up Recommendations  CIR    Equipment Recommendations  None recommended by OT    Recommendations for Other Services PT consult;Rehab consult;Speech consult     Precautions / Restrictions Precautions Precautions: Fall Restrictions Weight Bearing Restrictions: No      Mobility Bed Mobility Overal bed mobility: Needs Assistance Bed Mobility: Supine to Sit     Supine to sit: Min guard     General bed mobility comments: Min Guard A for safety    Transfers Overall transfer level: Needs assistance Equipment used: None Transfers: Sit to/from Stand Sit to Stand: Min guard;Min assist         General transfer comment: Min Guard-Min A for balance in standing pending trasnfer surface. Pt initially standing from EOB impulsively despite cues to wait    Balance Overall balance assessment: Needs assistance Sitting-balance support: No upper extremity supported;Feet supported Sitting balance-Leahy Scale: Good Sitting  balance - Comments: donning socks   Standing balance support: No upper extremity supported;During functional activity Standing balance-Leahy Scale: Good                             ADL either performed or assessed with clinical judgement   ADL Overall ADL's : Needs assistance/impaired Eating/Feeding: Set up;Sitting   Grooming: Brushing hair;Oral care;Min guard;Standing   Upper Body Bathing: Minimal assistance;Sitting   Lower Body Bathing: Minimal assistance;Sit to/from stand   Upper Body Dressing : Minimal assistance;Sitting   Lower Body Dressing: Minimal assistance;Sit to/from stand Lower Body Dressing Details (indicate cue type and reason): don socks Toilet Transfer: Minimal assistance;Regular Toilet;Ambulation   Toileting- Clothing Manipulation and Hygiene: Moderate assistance;Sit to/from stand Toileting - Clothing Manipulation Details (indicate cue type and reason): Pt unaware that while performing peri care he was caught on his gown; unsuccessful at posterior peri care     Functional mobility during ADLs: Minimal assistance General ADL Comments: Pt with decreased cognition, balance, and safety     Vision         Perception     Praxis      Pertinent Vitals/Pain Pain Assessment: Faces Faces Pain Scale: No hurt Pain Intervention(s): Monitored during session     Hand Dominance Left   Extremity/Trunk Assessment Upper Extremity Assessment Upper Extremity Assessment: Overall WFL for tasks assessed   Lower Extremity Assessment Lower Extremity Assessment: Defer to PT evaluation   Cervical / Trunk Assessment Cervical / Trunk Assessment: Kyphotic   Communication Communication Communication: Receptive difficulties;Expressive difficulties   Cognition Arousal/Alertness: Awake/alert Behavior During Therapy: WFL for tasks assessed/performed Overall Cognitive Status: Difficult to  assess Area of Impairment: Attention;Awareness;Problem solving;Following  commands;Safety/judgement;Memory                   Current Attention Level: Sustained;Selective Memory: Decreased short-term memory Following Commands: Follows one step commands inconsistently;Follows one step commands with increased time Safety/Judgement: Decreased awareness of safety;Decreased awareness of deficits Awareness: Intellectual Problem Solving: Slow processing;Requires verbal cues;Requires tactile cues General Comments: Pt with decreased awareness of safety and slightly impulsive. Pt requiring increased time and cues throughout. Pt unaware of bowel incontenience. Distracted by external environment.   General Comments  Wife present throughout session    Exercises     Shoulder [place] expects to be discharged to:: Private residence Living Arrangements: Spouse/significant other;Children Available Help at Discharge: Family;Available 24 hours/day Type of Home: House Home Access: Stairs to enter [employer] of Steps: 3-4 Entrance Stairs-Rails: Right;Left;Can reach both Home Layout: One level     Bathroom Shower/Tub: Walk-in shower;Tub/shower unit   Bathroom Toilet: Handicapped height     Home Equipment: Shower seat          Prior Functioning/Environment Level of Independence: Independent        Comments: ADLs, IADLs, and driving        OT Problem List: Decreased activity tolerance;Impaired balance (sitting and/or standing);Decreased knowledge of use of DME or AE;Decreased knowledge of precautions;Decreased safety awareness;Decreased cognition      OT Treatment/Interventions: Self-care/ADL training;Therapeutic exercise;Energy conservation;DME and/or AE instruction;Therapeutic activities;Patient/family education    OT Goals(Current goals can be found in the care plan section) Acute Rehab OT Goals Patient Stated Goal: Go home safely OT Goal Formulation: With patient/family Time For Goal Achievement:  07/30/20 Potential to Achieve Goals: Good  OT Frequency: Min 3X/week   Barriers to D/C:            Co-evaluation PT/OT/SLP Co-Evaluation/Treatment: Yes Reason for Co-Treatment: For patient/therapist safety;To address functional/ADL transfers   OT goals addressed during session: ADL's and self-care      AM-PAC OT "6 Clicks" Daily Activity     Outcome Measure Help from another person eating meals?: A Little Help from another person taking care of personal grooming?: A Little Help from another person toileting, which includes using toliet, bedpan, or urinal?: A Lot Help from another person bathing (including washing, rinsing, drying)?: A Little Help from another person to put on and taking off regular upper body clothing?: A Little Help from another person to put on and taking off regular lower body clothing?: A Little 6 Click Score: 17   End of Session Equipment Utilized During Treatment: Gait belt Nurse Communication: Mobility status  Activity Tolerance: Patient tolerated treatment well Patient left: in chair;with call bell/phone within reach;with chair alarm set  OT Visit Diagnosis: Unsteadiness on feet (R26.81);Other abnormalities of gait and mobility (R26.89);Muscle weakness (generalized) (M62.81);Other symptoms and signs involving cognitive function                Time: 1340-1408 OT Time Calculation (min): 28 min Charges:  OT General Charges $OT Visit: 1 Visit OT Evaluation $OT Eval Moderate Complexity: [place], OTR/L Acute Rehab Pager: [phone] Office: [place] 07/16/2020, 2:26 PM

## 2020-07-16 NOTE — Progress Notes (Signed)
PT Cancellation Note  Patient Details Name: Edwin Wall MRN: [id number] DOB: 12-13-1940   Cancelled Treatment:    Reason Eval/Treat Not Completed: Other (comment) - MD and residents in room, PT to check back as schedule allows.  Stacie Glaze, PT DPT Acute Rehabilitation Services Pager [phone]  Office [phone]    Louis Matte 07/16/2020, 10:50 AM

## 2020-07-17 ENCOUNTER — Inpatient Hospital Stay (HOSPITAL_COMMUNITY): Payer: Medicare Other

## 2020-07-17 DIAGNOSIS — I4891 Unspecified atrial fibrillation: Secondary | ICD-10-CM | POA: Diagnosis not present

## 2020-07-17 DIAGNOSIS — I639 Cerebral infarction, unspecified: Secondary | ICD-10-CM | POA: Diagnosis not present

## 2020-07-17 DIAGNOSIS — R112 Nausea with vomiting, unspecified: Secondary | ICD-10-CM

## 2020-07-17 DIAGNOSIS — E1149 Type 2 diabetes mellitus with other diabetic neurological complication: Secondary | ICD-10-CM | POA: Diagnosis not present

## 2020-07-17 DIAGNOSIS — R4701 Aphasia: Secondary | ICD-10-CM | POA: Diagnosis not present

## 2020-07-17 LAB — COMPREHENSIVE METABOLIC PANEL
ALT: 13 U/L (ref 0–44)
AST: 17 U/L (ref 15–41)
Albumin: 3 g/dL — ABNORMAL LOW (ref 3.5–5.0)
Alkaline Phosphatase: 54 U/L (ref 38–126)
Anion gap: 5 (ref 5–15)
BUN: 19 mg/dL (ref 8–23)
CO2: 27 mmol/L (ref 22–32)
Calcium: 9 mg/dL (ref 8.9–10.3)
Chloride: 107 mmol/L (ref 98–111)
Creatinine, Ser: 1.47 mg/dL — ABNORMAL HIGH (ref 0.61–1.24)
GFR, Estimated: 48 mL/min — ABNORMAL LOW (ref >60)
Glucose, Bld: 133 mg/dL — ABNORMAL HIGH (ref 70–99)
Potassium: 3.8 mmol/L (ref 3.5–5.1)
Sodium: 139 mmol/L (ref 135–145)
Total Bilirubin: 2 mg/dL — ABNORMAL HIGH (ref 0.3–1.2)
Total Protein: 5.9 g/dL — ABNORMAL LOW (ref 6.5–8.1)

## 2020-07-17 LAB — GLUCOSE, CAPILLARY
Glucose-Capillary: 126 mg/dL — ABNORMAL HIGH (ref 70–99)
Glucose-Capillary: 162 mg/dL — ABNORMAL HIGH (ref 70–99)
Glucose-Capillary: 126 mg/dL — ABNORMAL HIGH (ref 70–99)
Glucose-Capillary: 158 mg/dL — ABNORMAL HIGH (ref 70–99)

## 2020-07-17 IMAGING — CT CT HEAD W/O CM
3 series · 14 of 47 positions shown, 16 images · non-contrast
Comparison: Head CT [DATE] and MRI [DATE].

CLINICAL DATA: Follow-up CVA.

EXAM:
CT HEAD WITHOUT CONTRAST
TECHNIQUE: Contiguous axial images were obtained from the base of the skull
through the vertex without intravenous contrast.

[Series 3: head 5.0 h30s · axial · 0.45mm/px · z∈[-154,-19]mm · 8 of 33 slices shown, 10 images]
[im 3/33  brain]
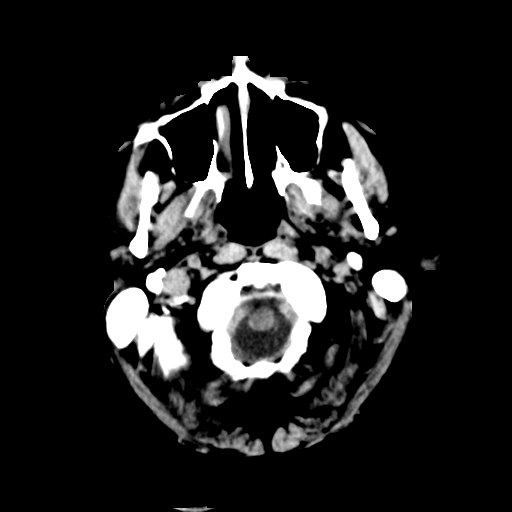
[im 3/33  bone]
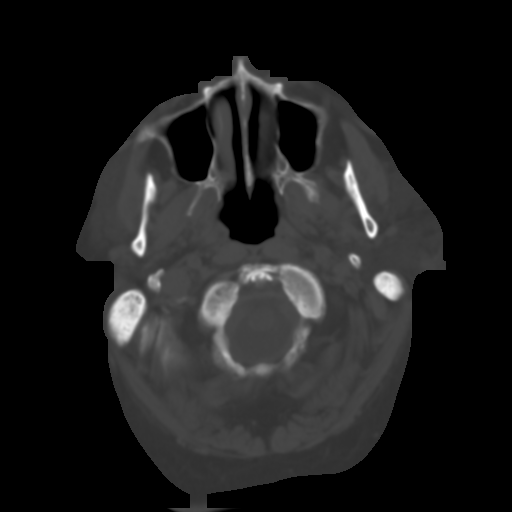
[im 7/33  brain]
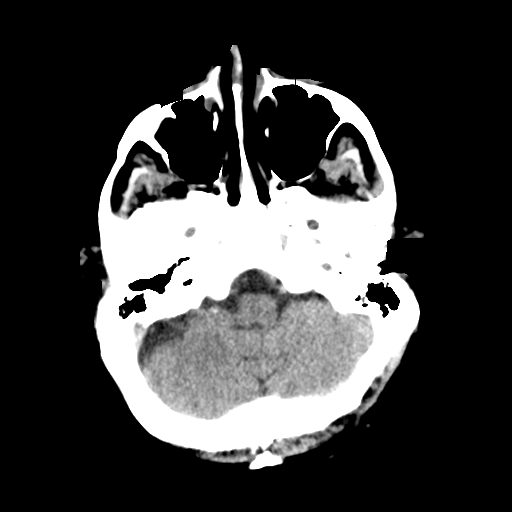
[im 10/33  brain]
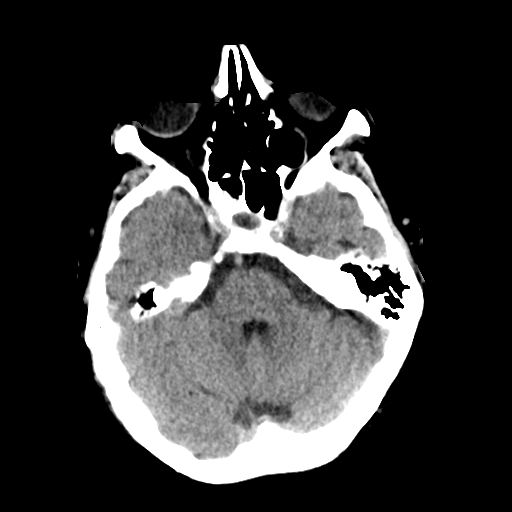
[im 15/33  brain]
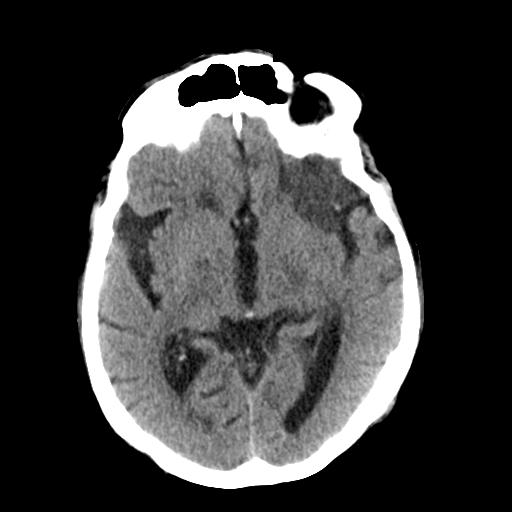
[im 18/33  brain]
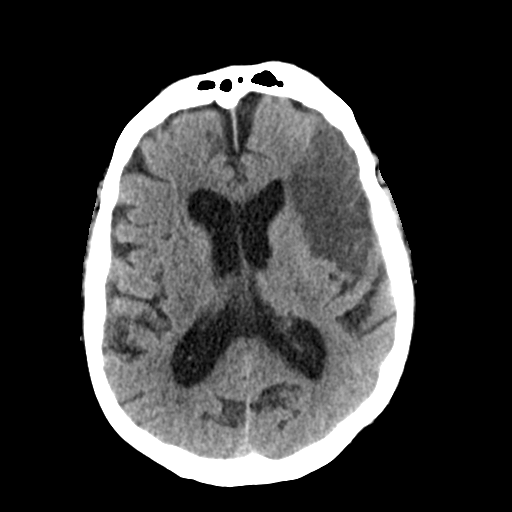
[im 18/33  bone]
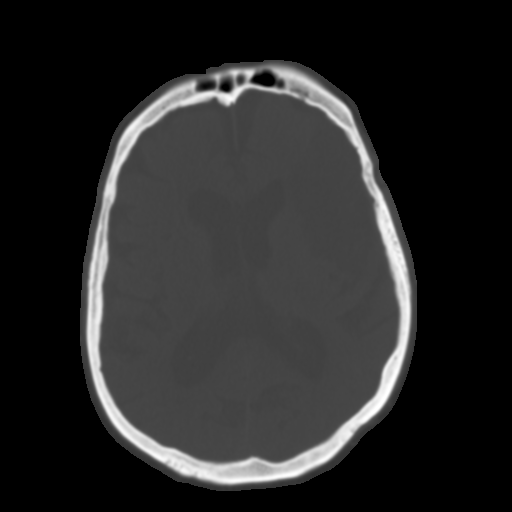
[im 23/33  brain]
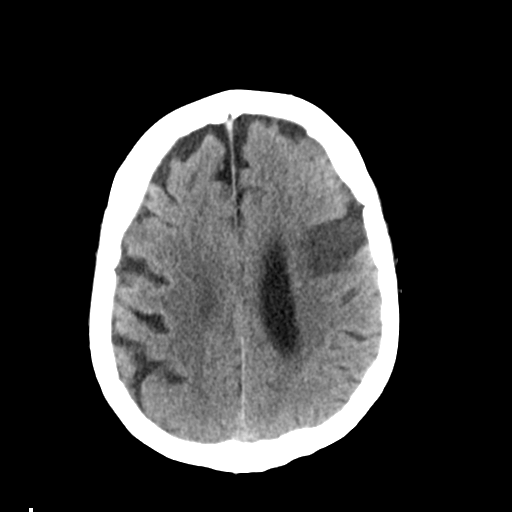
[im 26/33  brain]
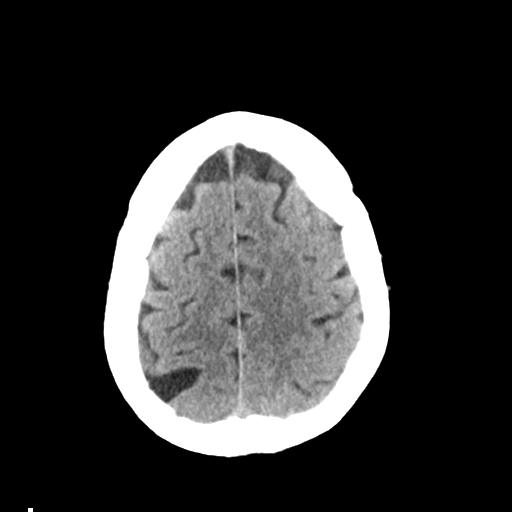
[im 30/33  brain]
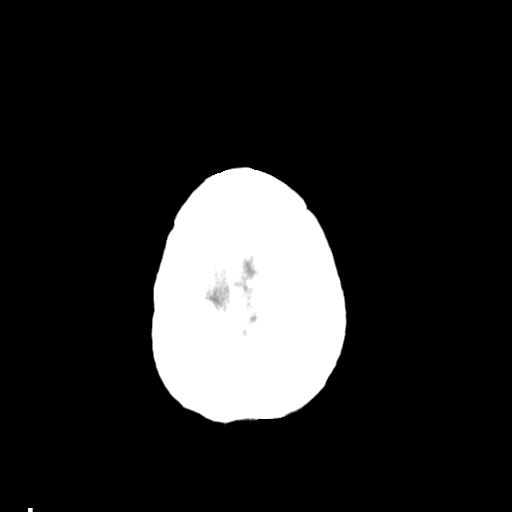

[Series 5: head 3.0 mpr cor · coronal · 0.34mm/px · 3 of 73 slices shown]
[im 25/73  brain]
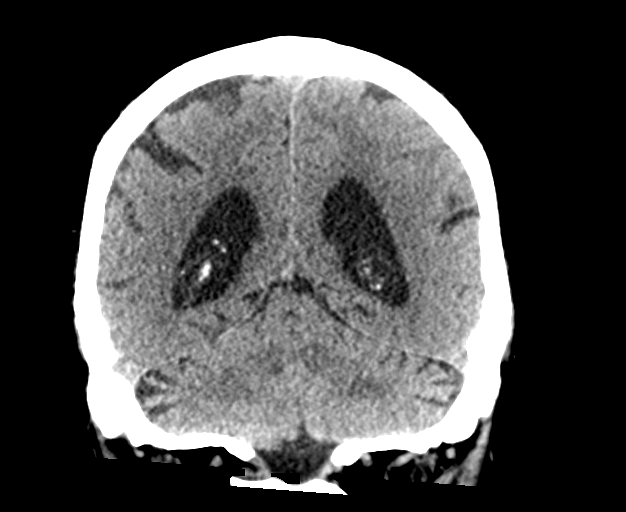
[im 33/73  brain]
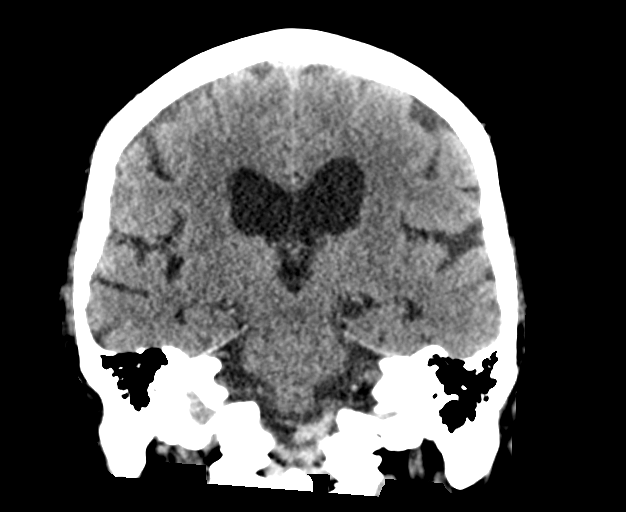
[im 41/73  brain]
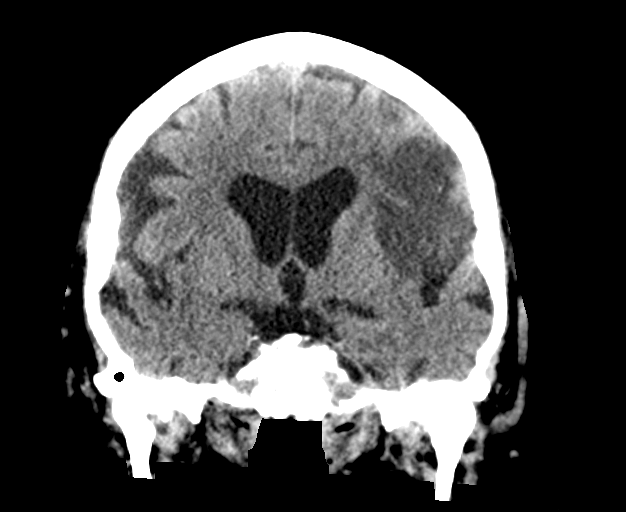

[Series 6: head 3.0 mpr sag · sagittal · 0.37mm/px · 3 of 67 slices shown]
[im 23/67  brain]
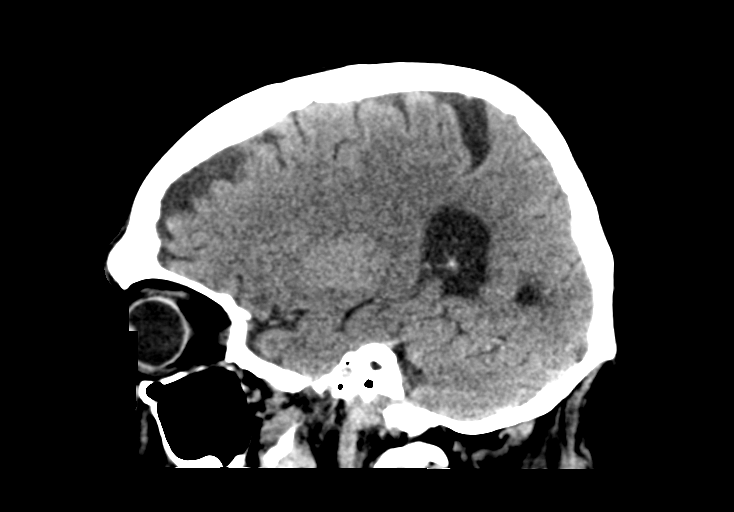
[im 34/67  brain]
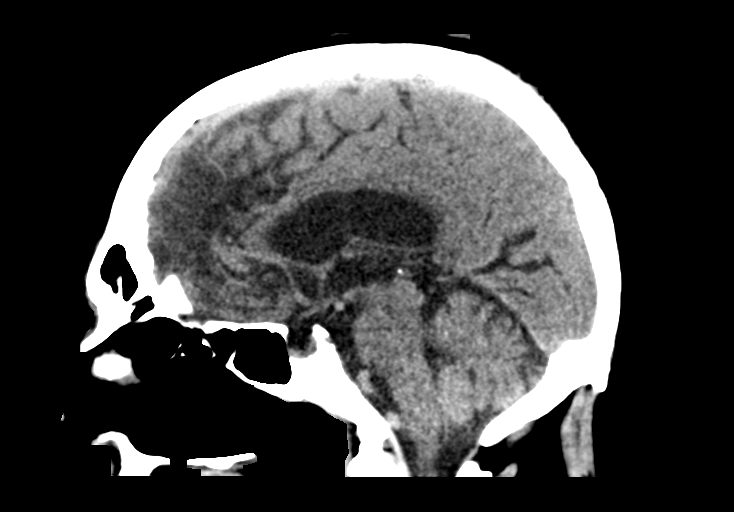
[im 45/67  brain]
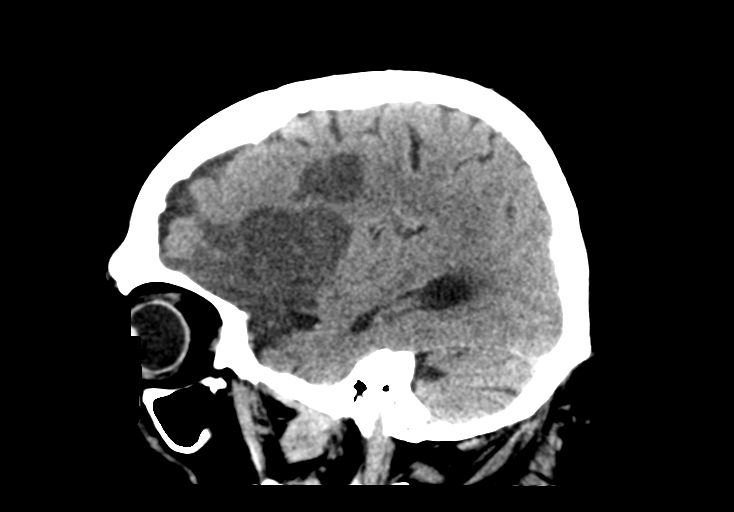

[14 of 47 positions shown; findings below may reference images not displayed]

FINDINGS: Brain: Slight evolutionary change and the large left MCA territory
infarct with more clearly defined area of low attenuation. No
findings suspicious for acute hemorrhage. No new mass effect. No new
infarcts are identified. Stable underlying age related cerebral
atrophy and ventriculomegaly.

Vascular: Stable vascular calcifications but no aneurysm or
hyperdense vessels.

Skull: No skull fracture or bone lesions.

Sinuses/Orbits: Chronic right half sphenoid sinus disease. The
paranasal sinuses and mastoid air cells are otherwise clear. The
globes are intact.

Other: No scalp lesions or scalp hematoma.
IMPRESSION: 1. Slight evolutionary change in the large left MCA territory
infarct. No findings suspicious for acute hemorrhage. No new
infarcts.
2. Stable underlying age related cerebral atrophy and
ventriculomegaly.
3. Chronic right half sphenoid sinus disease.

## 2020-07-17 MED ORDER — COLESTIPOL HCL 5 G PO PACK: 5.0000 g | PACK | Freq: Every day | ORAL | Status: DC

## 2020-07-17 MED ORDER — COLESTIPOL HCL 5 G PO PACK
5.0000 g | PACK | Freq: Every day | ORAL | Status: DC
  Administered 2020-07-18: 5 g via ORAL
  Filled 2020-07-17 (×2): qty 1

## 2020-07-17 MED ORDER — ONDANSETRON HCL 4 MG PO TABS: 4.0000 mg | ORAL_TABLET | Freq: Three times a day (TID) | ORAL | Status: DC | PRN

## 2020-07-17 MED ORDER — COLESTIPOL HCL 1 G PO TABS
1.0000 g | ORAL_TABLET | Freq: Two times a day (BID) | ORAL | Status: DC
  Administered 2020-07-17: 1 g via ORAL
  Filled 2020-07-17 (×2): qty 1

## 2020-07-17 MED ORDER — ONDANSETRON HCL 4 MG/2ML IJ SOLN
4.0000 mg | Freq: Once | INTRAMUSCULAR | Status: AC
  Administered 2020-07-17: 4 mg via INTRAVENOUS
  Filled 2020-07-17: qty 2

## 2020-07-17 NOTE — Progress Notes (Signed)
PT Cancellation Note  Patient Details Name: Edwin Wall MRN: [id number] DOB: 1940/09/14   Cancelled Treatment:    Reason Eval/Treat Not Completed: Patient at procedure or test/unavailable. Transport here to take pt to CT. Of note, RN also reports pt has had multiple episodes of loose stool this AM. He has a h/o partial colectomy. His home med that helps manage his GI issues was initially left off his hospital meds. It has now been added. Pt just received first dose, which will help with getting control over multiple, frequent loose stools.   Lorriane Shire 07/17/2020, 12:31 PM  Lorrin Goodell, PT  Office # [phone] Pager [phone]

## 2020-07-17 NOTE — Progress Notes (Signed)
This patient's plan of care was discussed with the house staff. Please see their note for complete details. I concur with their findings.  His speech is roughly stable today. Will f/u with Neuro re: potential re-imaging.  Appreciate CIR eval.

## 2020-07-17 NOTE — Progress Notes (Signed)
Physical Therapy Treatment Patient Details Name: Edwin Wall MRN: [id number] DOB: [birth year] Today's Date: 07/17/2020    History of Present Illness 80 yo male presents to the ED on 5/25 with speech difficutlies and facial droop. 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 PMH including anxiety, arthritis, colon cancer (dx 2018), HTN, obesity, and pre-daibetes.    PT Comments    Pt continues with impaired balance, increased falls risk, R sided inattention, impaired sequencing/processing, in addition to aphasia. Pt was able to state name today but unable to verbalize majority of session. Pt to continue to benefit from CIR upon d/c as pt demo's excellent rehab potential, is eager to get better, and was indep PTA. Acute PT to cont to follow.   Follow Up Recommendations  CIR     Equipment Recommendations  None recommended by PT    Recommendations for Other Services       Precautions / Restrictions Precautions Precautions: Fall Restrictions Weight Bearing Restrictions: No    Mobility  Bed Mobility Overal bed mobility: Needs Assistance Bed Mobility: Supine to Sit     Supine to sit: Min assist     General bed mobility comments: minA for trunk elevation, max verbal cues to complete task    Transfers Overall transfer level: Needs assistance Equipment used: None Transfers: Sit to/from Stand Sit to Stand: Min assist         General transfer comment: minA to power and for safety as pt impulsive  Ambulation/Gait Ambulation/Gait assistance: Min assist Gait Distance (Feet): 20 Feet (to/from bathroom) Assistive device: None Gait Pattern/deviations: Step-through pattern;Drifts right/left Gait velocity: decr   General Gait Details: pt with lateral sway L/R, unsteady, hard to navigate given instructions and around obstacles, pt with loose stool incontinence in room, pt assisted to  the bathroom but ultimately unable to amb in hallway due to loose stool   Stairs             Wheelchair Mobility    Modified Rankin (Stroke Patients Only) Modified Rankin (Stroke Patients Only) Pre-Morbid Rankin Score: No symptoms Modified Rankin: Moderately severe disability     Balance Overall balance assessment: Needs assistance Sitting-balance support: No upper extremity supported;Feet supported Sitting balance-Leahy Scale: Good Sitting balance - Comments: able to anteriorly translate trunk to don socks   Standing balance support: No upper extremity supported;During functional activity Standing balance-Leahy Scale: Fair Standing balance comment: cannot accept challenge                            Cognition Arousal/Alertness: Awake/alert Behavior During Therapy: WFL for tasks assessed/performed Overall Cognitive Status: Difficult to assess Area of Impairment: Attention;Awareness;Problem solving;Following commands;Safety/judgement;Memory;Orientation                 Orientation Level: Disoriented to;Place;Situation Current Attention Level: Sustained;Selective Memory: Decreased short-term memory Following Commands: Follows one step commands inconsistently;Follows one step commands with increased time Safety/Judgement: Decreased awareness of safety;Decreased awareness of deficits Awareness: Intellectual Problem Solving: Slow processing;Requires verbal cues;Requires tactile cues General Comments: pt able to state name and hospital today but stated [place] and was unable to state date or choose correct date when given choices, pt with noted R sided inattention and remains easily distracted, hard time sequencing washing hands at sink, forgot to turn water off, unable to find paper towels on the R despite max verbal cues      Exercises  General Comments General comments (skin integrity, edema, etc.): pt with loose stool incontinence       Pertinent Vitals/Pain Faces Pain Scale: No hurt    Home Living Family/patient expects to be discharged to:: Private residence Living Arrangements: Spouse/significant other;Children Available Help at Discharge: Family;Available 24 hours/day Type of Home: House Home Access: Stairs to enter Entrance Stairs-Rails: Right;Left;Can reach both Home Layout: One level Home Equipment: Shower seat      Prior Function Level of Independence: Independent      Comments: ADLs, IADLs, and driving   PT Goals (current goals can now be found in the care plan section) Acute Rehab PT Goals Patient Stated Goal: Go home safely PT Goal Formulation: With patient/family Time For Goal Achievement: 07/30/20 Potential to Achieve Goals: Good Progress towards PT goals: Progressing toward goals    Frequency    Min 4X/week      PT Plan Current plan remains appropriate    Co-evaluation              AM-PAC PT "6 Clicks" Mobility   Outcome Measure  Help needed turning from your back to your side while in a flat bed without using bedrails?: A Little Help needed moving from lying on your back to sitting on the side of a flat bed without using bedrails?: A Little Help needed moving to and from a bed to a chair (including a wheelchair)?: A Little Help needed standing up from a chair using your arms (e.g., wheelchair or bedside chair)?: A Little Help needed to walk in hospital room?: A Little Help needed climbing 3-5 steps with a railing? : A Lot 6 Click Score: 17    End of Session Equipment Utilized During Treatment: Gait belt Activity Tolerance: Patient tolerated treatment well;Patient limited by fatigue Patient left: in chair;with call bell/phone within reach;with family/visitor present;with chair alarm set Nurse Communication: Mobility status PT Visit Diagnosis: Other abnormalities of gait and mobility (R26.89);Other symptoms and signs involving the nervous system (R29.898)     Time:  1027-2536 PT Time Calculation (min) (ACUTE ONLY): 26 min  Charges:  $Gait Training: 8-22 mins $Neuromuscular Re-education: 8-22 mins                     Kittie Plater, PT, DPT Acute Rehabilitation Services Pager #: [phone] Office #: [phone]    Berline Lopes 07/17/2020, 6:20 PM

## 2020-07-17 NOTE — Progress Notes (Signed)
   07/16/20 1130  SLP Visit Information  SLP Received On 07/16/20  General Information  HPI Mr. Husted s a 80 yo male arriving with acute onset of difficulty with word finding, dysarthria and facial droop after patient ate breakfast. 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  Type of Study Bedside Swallow Evaluation  Previous Swallow Assessment none  Diet Prior to this Study NPO  Temperature Spikes Noted No  Respiratory Status Room air  History of Recent Intubation No  Behavior/Cognition Alert;Cooperative;Pleasant mood  Oral Cavity Assessment WFL  Oral Care Completed by SLP No  Oral Cavity - Dentition Adequate natural dentition  Vision Functional for self-feeding  Self-Feeding Abilities Able to feed self  Patient Positioning Upright in bed  Baseline Vocal Quality Normal  Volitional Cough Strong  Volitional Swallow Able to elicit  Oral Motor/Sensory Function  Overall Oral Motor/Sensory Function WFL  Thin Liquid  Thin Liquid WFL  Nectar Thick Liquid  Nectar Thick Liquid NT  Honey Thick Liquid  Honey Thick Liquid WFL  Puree  Puree WFL  Solid  Solid WFL  SLP Assessment  Clinical Impression Statement (ACUTE ONLY) Pt able to swallow without acute impairment. No observation of facial nerve weakness. no signs of aspiration. Pt does reports a history of mild pill dysphagia, has to drink a lot of water with his pills. He may initiate a regular diet and thin liquids. Will sign off for swallow, but follow for aphasia, see prior note.  SLP Visit Diagnosis Dysphagia, unspecified (R13.10)  Impact on safety and function Mild aspiration risk  Other Related Risk Factors History of dysphagia  Swallow Evaluation Recommendations  SLP Diet  Recommendations Regular;Thin liquid  Liquid Administration via Straw;Cup  Medication Administration Whole meds with liquid  Supervision Patient able to self feed  Compensations Slow rate;Small sips/bites  Postural Changes Seated upright at 90 degrees  Treatment Plan  Follow up Recommendations Inpatient Rehab  Speech Therapy Frequency (ACUTE ONLY) min 2x/week  Progression Toward Goals  Potential to Achieve Goals (ACUTE ONLY) Good  SLP Time Calculation  SLP Start Time (ACUTE ONLY) 1040  SLP Stop Time (ACUTE ONLY) 1050  SLP Time Calculation (min) (ACUTE ONLY) 10 min  SLP Evaluations  $ SLP Speech Visit 1 Visit  SLP Evaluations  $BSS Swallow 1 Procedure   Signed on behalf of Herbie Baltimore, SLP from eval 07/16/2020  Kathleen Lime, MS [place] Office [phone] Pager [phone]

## 2020-07-17 NOTE — Progress Notes (Signed)
Inpatient Rehab Admissions Coordinator:   Met with pt and his wife at bedside to discuss CIR recommendations.  Pt plan to discharge home with wife and adult son to provide 24/7 supervision/assist, if needed.  We reviewed home set up and average length of stay on CIR, as well as Medicare coverage of CIR.  Answered questions and provided booklets.  Await results of rehab MD consult and I will review goals and estimated length of stay with them tomorrow.  I will not have a bed for this patient on CIR today.   Shann Medal, PT, DPT Admissions Coordinator [phone] 07/17/20  11:40 AM

## 2020-07-17 NOTE — Progress Notes (Signed)
SLP was notified by therapy that pt had difficulty swallowing pill today - SLP had signed off re: swallowing but per notes - pt reported pill dysphagia.  SLP advised RN to give with applesauce/icecream given reported h/o dysphagia per notes.  RN agreeable.   Kathleen Lime, MS [facility] SLP Acute Rehab Services Office [phone] Pager [phone]

## 2020-07-17 NOTE — Progress Notes (Signed)
Subjective:   Edwin Wall states he feels nauseas after having a large episode of non-bloody emesis this morning. His wife states this was just after he finished eating breakfast and she'd "never seen him eat so fast". He does endorse lower abdominal pain with this, but also says "yes" to feeling dizzy and seeing double vision, with persistent nausea. He says he has had pain with urination that preceded above symptoms as well as chills but denies feeling feverish or new flank pain. He says he did have a normal BM for him this hospitalization. Denies headache or any other symptoms. His wife notes that he takes    Objective:  Vital signs in last 24 hours: Vitals:   07/17/20 0429 07/17/20 0432 07/17/20 0734 07/17/20 1231  BP: (!) 144/99  134/87 (!) 142/90  Pulse: (!) 58  86 90  Resp: 18 16 16 20   Temp: 97.9 F (36.6 C)  98.3 F (36.8 C) 98.5 F (36.9 C)  TempSrc: Oral  Oral Oral  SpO2: 100%  97% 98%  Weight:      Height:       General: Patient is obese. Resting comfortably in no acute distress.  Eyes: Sclera non-icteric. No conjunctival injection. Appears to have some troubles with full right-ward gaze although extraocular movements are otherwise fully intact. No nystagmus.  HENT: Slightly dry mucus membranes.   Respiratory: Lungs are CTA anteriorly bilaterally. Effort and saturations normal on room air.  Cardiovascular: Rate is normal. Rhythm is irregularly irregular. No murmurs, rubs, or gallops. There is trace bilateral LE pitting edema (improving). MSK: I am unable to fully assess strength of his bilateral upper extremities although ROM is intact. Bilateral LE's have 4+/5 strength.  Neurological: Now has more significant Wernicke's aphasia with more mild Broca's aphasia. He is able to identify objects correctly. He follows simple commands about 50% of the time. He has a mild right-sided facial droop at rest although resolves with motion. CN III-XII otherwise intact aside from eye  exam as above.  Psych: Pleasant and cooperative.  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 Given new symptoms of nausea and dizziness, repeat head CT without contrast was repeated STAT which showed evolutionary changes in the region of his stroke, although no evidence of hemorrhage or new acute infarction. Today, he does have more pronounced Wernicke's aphasia rather than Broca's aphasia although continues to struggle with both.  - ECHO results are pending  - Will get repeat SLP evaluation  - Continue PT/OT evaluations with plan for CIR (may be accepted as early as tomorrow per SW) - Neurology following, appreciate their recommendations - Continue ASA 325mg  daily for now  - Continue fall precautions with frequent neuro checks   # Nausea / Vomiting  Patient's single episode of vomiting this morning may be in the setting of rapid eating (consider due to frontal lobe impairment vs. Hunger missing meals yesterday). However, he continues to have ongoing nausea and lower abdominal pain with dysuria that preceded above, concerning for possible infection. His symptoms may also be in the setting of missing colestipol dosing. Wife states that he takes this  1-2x daily although this was not started on admission as it wasn't on admission.  - Will check urinalysis to r/o UTI  - Urine culture if concerning for infection  - Will get repeat SLP evaluation  - Start colestipol 1g BID - Zofran q8 hours PRN   # New Atrial Fibrillation  # New RBBB Initial EKG in the ED showed a new RBBB. He may have gone back into NSR  early this morning although on exam was back in A-Fib. TSH normal. CHADs-VASc score is 5 and HASBLED score is 3. He is not on any home anticoagulation and denies any recent bleeding despite dx of colon cancer in 2018 with recurrent polyps since.  - TTE pending  - Will start Eliquis 5/20-5/22 for future stroke prevention, holding off acutely due to large size / high risk infarct - Continuous telemetry monitoring   # HTN Blood pressures remain slightly elevated in the 140's although improved since yesterday. He takes Benzapril 40mg  daily at home, prescribed recently by his PCP although this has been held in the setting of stroke.  - Plan to switch to low dose Losartan tomorrow if his pressures remain elevated  - Goal of normalizing blood pressures in ~3 days   # New-Onset DM  Very likely T2DM. Hgb A1c this admission increased from 6.6 in 2018 to 7.1% this admission. He is not on any glucose-lowering agents at home. Sugars are slightly elevated although <180 consistently on moderate SSI. - Continue moderate SSI for now; will increase if CBG's continue to rise to 180 - Plan to start Metformin upon discharge with close outpatient follow up  # Anxiety Patient takes Lorazepam 1mg  once daily at home for anxiety that has been exacerbated recently.  - Continue home dose while admitted - Recommend trial of SSRI outpatient instead  # Hyperbilirubinemia, Stable  # CKD Stage 3, Unspecified whether a or b - Continue to monitor CMP  Code Status: Full Code  Diet: CM/HH IVF: None  DVT PPx: SCD's Prior to Admission Living Arrangement: Home Anticipated Discharge Location: CIR  Barriers to Discharge: Ongoing CVA workup and placement   Jeralyn Bennett, MD 07/17/2020, 3:04 PM Pager: [phone] After 5pm on weekdays and 1pm on weekends: On Call pager [phone]

## 2020-07-17 NOTE — Consult Note (Signed)
Physical Medicine and Rehabilitation Consult Reason for Consult: Right side weakness aphasia with facial droop Referring Physician: Dr. Johnnye Sima   HPI: Edwin Wall is a 80 y.o. right-handed male with history of diabetes mellitus, hypertension, colon cancer status post colectomy 2018, obesity with BMI 36.24, anxiety.  Per chart review lives with spouse.  Independent prior to admission.  1 level home with 3 steps to entry.  Presented 07/15/2020 with acute onset of right side weakness speech difficulties and facial droop.  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  No acute hemorrhage.  Patient did not receive tPA.  CT angiogram of head and neck showed left M2 occlusion.  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  No evidence of hemorrhagic conversion no significant mass-effect.   Admission chemistries unremarkable aside glucose 155, creatinine 1.52, SARS coronavirus negative, hemoglobin A1c 7.1.  Hospital course new onset atrial fibrillation.  Echocardiogram pending.  Neurology follow-up currently on aspirin 325 mg daily considering plan for Eliquis 5 to 7 days post stroke for stroke prevention.  Tolerating a regular consistency diet.  Therapy evaluations completed due to patient's right side weakness and decreased functional mobility recommendations of physical medicine rehab consult.  Pt reports sleepy- had another CT and meds to sedate him.  No control of B/B per wife since stroke.  On regular/thin diet right now.  LBM in last 24 hours; using male purewick for voiding.    Review of Systems  Constitutional: Negative for chills and fever.  HENT: Negative for hearing loss.   Eyes: Negative for blurred vision and double vision.  Respiratory: Negative for cough and shortness of breath.   Cardiovascular:  Positive for palpitations. Negative for chest pain and leg swelling.  Gastrointestinal: Positive for constipation. Negative for heartburn and nausea.  Genitourinary: Negative for dysuria, flank pain and hematuria.  Musculoskeletal: Positive for joint pain and myalgias.  Skin: Negative for rash.  Neurological: Positive for speech change and weakness.  Psychiatric/Behavioral:       Anxiety  All other systems reviewed and are negative.  Past Medical History:  Diagnosis Date  . Anxiety   . Arthritis   . Cataract   . Colon cancer ([place]) dx'd 11/2016   "stage 3"  . Gout   . Gout   . Hemorrhoids   . History of kidney stones    "passed it"  . Hypertension   . Pre-diabetes    Past Surgical History:  Procedure Laterality Date  . COLECTOMY  12/17/2016   lap; partial sigmoid colectomy/notes 12/17/2016  . COLONOSCOPY W/ BIOPSIES AND POLYPECTOMY  11/11/2016  . LAPAROSCOPIC SIGMOID COLECTOMY [name] 12/17/2016   Procedure: LAPAROSCOPIC SIGMOID COLECTOMY;  Surgeon: [name], MD;  Location: [facility];  Service: General;  Laterality: [name];  . NASAL SEPTUM SURGERY    . TONSILLECTOMY     Family History  Problem Relation Age of Onset  . Cancer Mother        LUNG  . COPD Father    Social History:  reports that he has never smoked. He has never used smokeless tobacco. He reports that he does not drink alcohol and does not use drugs. Allergies: No Known Allergies Medications Prior to Admission  Medication Sig Dispense Refill  . benazepril (LOTENSIN) 40 MG tablet Take 40 mg by mouth daily.    Marland Kitchen  diclofenac (VOLTAREN) 75 MG EC tablet Take 75 mg by mouth daily as needed for mild pain.    Marland Kitchen LORazepam (ATIVAN) 1 MG tablet Take 1 mg by mouth daily as needed for anxiety.      Home: Home Living Family/patient expects to be discharged to:: Private residence Living Arrangements: Spouse/significant other,Children Available Help at Discharge: Family,Available 24 hours/day Type of Home: House Home  Access: Stairs to enter [employer] of Steps: 3-4 Entrance Stairs-Rails: Right,Left,Can reach both Home Layout: One level Bathroom Shower/Tub: Ambulance person: Handicapped height Home Equipment: Careers adviser History: Prior Function Level of Independence: Independent Comments: ADLs, IADLs, and driving Functional Status:  Mobility: Bed Mobility Overal bed mobility: Needs Assistance Bed Mobility: Supine to Sit Supine to sit: Min guard General bed mobility comments: Min Guard A for safety Transfers Overall transfer level: Needs assistance Equipment used: None Transfers: Sit to/from Stand Sit to Stand: Min assist General transfer comment: Min Guard-Min A for balance in standing pending transer surface. Pt initially standing from EOB impulsively despite cues to wait multiple times Ambulation/Gait Ambulation/Gait assistance: Min assist Gait Distance (Feet): 100 Feet (+50) Assistive device: None Gait Pattern/deviations: Step-through pattern,Drifts right/left General Gait Details: min assist to steady, x1 LOB when pt encountered unexpected object in hallway requiring PT assist to correct. Standing break to cue pt to navigate back to room, unable to do so without significant cuing. Pt incontinent of stool and urine in hallway. Gait velocity: decr    ADL: ADL Overall ADL's : Needs assistance/impaired Eating/Feeding: Set up,Sitting Grooming: Brushing hair,Oral care,Min guard,Standing Upper Body Bathing: Minimal assistance,Sitting Lower Body Bathing: Minimal assistance,Sit to/from stand Upper Body Dressing : Minimal assistance,Sitting Lower Body Dressing: Minimal assistance,Sit to/from stand Lower Body Dressing Details (indicate cue type and reason): don socks Toilet Transfer: Minimal assistance,Regular Toilet,Ambulation Toileting- Clothing Manipulation and Hygiene: Moderate assistance,Sit to/from stand Toileting - Clothing  Manipulation Details (indicate cue type and reason): Pt unaware that while performing peri care he was caught on his gown; unsuccessful at posterior peri care Functional mobility during ADLs: Minimal assistance General ADL Comments: Pt with decreased cognition, balance, and safety  Cognition: Cognition Overall Cognitive Status: Difficult to assess Arousal/Alertness: Awake/alert Orientation Level: Oriented to person,Oriented to place Attention: Sustained,Alternating Sustained Attention: Appears intact Alternating Attention: Appears intact Memory:  (UTA) Awareness: Impaired Awareness Impairment: Emergent impairment Problem Solving: Appears intact Safety/Judgment: Appears intact Cognition Arousal/Alertness: Awake/alert Behavior During Therapy: WFL for tasks assessed/performed Overall Cognitive Status: Difficult to assess Area of Impairment: Attention,Awareness,Problem solving,Following commands,Safety/judgement,Memory,Orientation Orientation Level: Disoriented to,Place,Situation Current Attention Level: Sustained,Selective Memory: Decreased short-term memory Following Commands: Follows one step commands inconsistently,Follows one step commands with increased time Safety/Judgement: Decreased awareness of safety,Decreased awareness of deficits Awareness: Intellectual Problem Solving: Slow processing,Requires verbal cues,Requires tactile cues General Comments: Pt with decreased awareness of safety and slightly impulsive. Pt requiring increased time and cues throughout. Pt unaware of bowel incontenience. Distracted by external environment. Pt responds "yes" inappropriately to several questions, including "are you in [employer]", "are you at a church" Difficult to assess due to: Impaired communication (aphasia)  Blood pressure (!) 144/99, pulse (!) 58, temperature 97.9 F (36.6 C), temperature source Oral, resp. rate 16, height 5' 9.5" (1.765 m), weight 112.9 kg, SpO2 100 %. Physical  Exam Vitals and nursing note reviewed. Exam conducted with a chaperone present.  Constitutional:      Appearance: He is obese.     Comments: Appears very sleepy; supine in bed; kept eyes closed; but  speaking some; wife at bedside, elderly male; NAD  HENT:     Head: Normocephalic and atraumatic.     Comments: Mild R facial droop- not able to stick tongue out?    Right Ear: External ear normal.     Left Ear: External ear normal.     Nose: Nose normal. No congestion.     Mouth/Throat:     Mouth: Mucous membranes are Wall.     Pharynx: Oropharynx is clear. No oropharyngeal exudate.  Eyes:     General:        Right eye: No discharge.        Left eye: No discharge.     Comments: Kept eyes closed- won't open;   Cardiovascular:     Rate and Rhythm: Normal rate. Rhythm irregular.     Heart sounds: Normal heart sounds. No murmur heard. No gallop.   Pulmonary:     Comments: CTA B/L- no W/R/R- good air movement Abdominal:     Comments: Soft, NT, ND, (+)BS    Genitourinary:    Comments: Male purewick in place- medium amber urine in container Musculoskeletal:     Cervical back: Normal range of motion and neck supple.     Comments: LUE/LLE- 5/5 but somewhat apraxic RUE- 4/5 in biceps, triceps, WE, grip and finger abd RLE- HF 2/5, KE 4+/5, DF 4-/5, and PF 4-/5  Skin:    Comments: No skin breakdown seen- mild bruising on arms  Neurological:     Comments: Patient is alert.  Makes eye contact with examiner.  He does exhibit receptive expressive aphasia.  Inconsistent to follow simple commands.  He was able to state his wife's name and repeat some simple 3 word phrases. Pt was able to repeat- No, if's or buts"- slightly slow, but repeated correctly However spontaneous speech impaired greatly- mod to severe aphasia/expressive and possibly receptive- thought [place]" [place]", said doesn't know year or date/day/month- notable perseveration. Interfered in exam  Psychiatric:      Comments: Lethargic, sleepy, slowed responses     Results for orders placed or performed during the hospital encounter of 07/15/20 (from the past 24 hour(s))  Glucose, capillary     Status: Abnormal   Collection Time: 07/16/20  8:26 AM  Result Value Ref Range   Glucose-Capillary 127 (H) 70 - 99 mg/dL   Comment 1 Notify RN    Comment 2 Document in Chart   Glucose, capillary     Status: Abnormal   Collection Time: 07/16/20 11:50 AM  Result Value Ref Range   Glucose-Capillary 150 (H) 70 - 99 mg/dL   Comment 1 Notify RN    Comment 2 Document in Chart   Glucose, capillary     Status: Abnormal   Collection Time: 07/16/20  4:51 PM  Result Value Ref Range   Glucose-Capillary 116 (H) 70 - 99 mg/dL   Comment 1 Notify RN    Comment 2 Document in Chart   Glucose, capillary     Status: Abnormal   Collection Time: 07/16/20  9:02 PM  Result Value Ref Range   Glucose-Capillary 150 (H) 70 - 99 mg/dL   MR BRAIN WO CONTRAST  Result Date: 07/15/2020 CLINICAL DATA:  Neuro deficit, acute, stroke suspected. EXAM: MRI HEAD WITHOUT CONTRAST TECHNIQUE: Multiplanar, multiecho pulse sequences of the brain and surrounding structures were obtained without intravenous contrast. COMPARISON:  Noncontrast head CT, CT angiogram head/neck and CT perfusion performed earlier today 07/15/2020. FINDINGS: Brain: Mild cerebral and cerebellar  atrophy. Acute cortical/subcortical infarct affecting the left frontal operculum, anterolateral left frontal lobe and left insula. The infarct measures up to 6.8 x 3.2 x 5.5 cm in greatest dimensions. No evidence of hemorrhagic conversion. No significant mass effect at this time. Background mild multifocal T2/FLAIR hyperintensity within the cerebral white matter, nonspecific but compatible with chronic small vessel ischemic disease. No evidence of intracranial mass. No chronic intracranial blood products. No extra-axial fluid collection. No midline shift. Vascular: T2* signal loss at site  of a known left M2 MCA occlusion. Skull and upper cervical spine: No focal marrow lesion. Sinuses/Orbits: Visualized orbits show no acute finding. Trace scattered paranasal sinus mucosal thickening. Small right maxillary sinus mucous retention cyst. Other: Bilateral mastoid effusions IMPRESSION: 6.8 x 3.2 x 5.5 cm acute cortical/subcortical infarct affecting the left frontal operculum, anterolateral left frontal lobe and left insula (MCA vascular territory). No evidence of hemorrhagic conversion. No significant mass effect at this time. Background mild generalized parenchymal atrophy and cerebral white matter chronic small vessel ischemic disease. Bilateral mastoid effusions. Electronically Signed   By: Kellie Simmering DO   On: 07/15/2020 15:18   CT CEREBRAL PERFUSION W CONTRAST  Result Date: 07/15/2020 CLINICAL DATA:  Code stroke with abnormal head CT EXAM: CT ANGIOGRAPHY HEAD AND NECK CT PERFUSION BRAIN TECHNIQUE: Multidetector CT imaging of the head and neck was performed using the standard protocol during bolus administration of intravenous contrast. Multiplanar CT image reconstructions and MIPs were obtained to evaluate the vascular anatomy. Carotid stenosis measurements (when applicable) are obtained utilizing NASCET criteria, using the distal internal carotid diameter as the denominator. Multiphase CT imaging of the brain was performed following IV bolus contrast injection. Subsequent parametric perfusion maps were calculated using RAPID software. CONTRAST:  111mL OMNIPAQUE IOHEXOL 350 MG/ML SOLN COMPARISON:  Noncontrast CT from earlier today FINDINGS: CTA NECK FINDINGS Aortic arch: 2 vessel arch.  Mild atheromatous plaque Right carotid system: Low-density plaque at the bifurcation without common carotid or ICA stenosis. No ulceration or dissection. Left carotid system: Low-density plaque at the bifurcation without stenosis or ulceration. Vertebral arteries: Subclavian atherosclerosis. Codominant vertebral  arteries that are smoothly contoured and widely patent to the dura. Skeleton: No acute finding. Severe cervical spine degeneration with ridging causing foraminal an cord impingement. Other neck: Negative Upper chest: Negative Review of the MIP images confirms the above findings CTA HEAD FINDINGS Anterior circulation: Mild atheromatous calcification at the siphons. Left M2 branch occlusion correlating with a hyperdensity by CT. No additional branch occlusion is seen. Negative for aneurysm. Posterior circulation: The vertebral and basilar arteries are smooth and widely patent. No branch occlusion, beading, or aneurysm. Venous sinuses: Negative Anatomic variants: Negative Review of the MIP images confirms the above findings CT Brain Perfusion Findings: ASPECTS: 6-7 CBF (<30%) Volume: 70mL Perfusion (Tmax>6.0s) volume: 13mL Mismatch Volume: 66mL Infarction Location:Anterior division left MCA territory Case discussed with Dr. Milas Gain while in progress. IMPRESSION: 1. Left M2 occlusion. CT perfusion reports 26 cc of core infarct with 17 cc of penumbra, but based on noncontrast head CT the penumbra of may be overestimated. 2. Overall mild atherosclerosis for age. No embolic source seen in the neck. Electronically Signed   By: Monte Fantasia M.D.   On: 07/15/2020 09:57   CT HEAD CODE STROKE WO CONTRAST  Result Date: 07/15/2020 CLINICAL DATA:  Code stroke.  Aphasia EXAM: CT HEAD WITHOUT CONTRAST TECHNIQUE: Contiguous axial images were obtained from the base of the skull through the vertex without intravenous contrast. COMPARISON:  None. FINDINGS:  Brain: Cytotoxic edema in the anterior insula, anterior inferior frontal lobe, and high lateral frontal lobe. Borderline involvement of the putamen. No hemorrhage, hydrocephalus, or pre-existing infarct. Cerebral volume loss without specific pattern. Vascular: Dense left M2 branch at the level of infarct. Skull: Negative Sinuses/Orbits: Negative Other: These results were called by  telephone at the time of interpretation on 07/15/2020 at 9:36 am to Dr Sal , who verbally acknowledged these results. ASPECTS [facility] Stroke Program Early CT Score) - Ganglionic level infarction (caudate, lentiform nuclei, internal capsule, insula, M1-M3 cortex): 4-5 - Supraganglionic infarction (M4-M6 cortex): 2 Total score (0-10 with 10 being normal): 6-7 IMPRESSION: 1. 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 ASPECTS is 6-7. 2. No acute hemorrhage. Electronically Signed   By: Monte Fantasia M.D.   On: 07/15/2020 09:40   CT ANGIO HEAD CODE STROKE  Result Date: 07/15/2020 CLINICAL DATA:  Code stroke with abnormal head CT EXAM: CT ANGIOGRAPHY HEAD AND NECK CT PERFUSION BRAIN TECHNIQUE: Multidetector CT imaging of the head and neck was performed using the standard protocol during bolus administration of intravenous contrast. Multiplanar CT image reconstructions and MIPs were obtained to evaluate the vascular anatomy. Carotid stenosis measurements (when applicable) are obtained utilizing NASCET criteria, using the distal internal carotid diameter as the denominator. Multiphase CT imaging of the brain was performed following IV bolus contrast injection. Subsequent parametric perfusion maps were calculated using RAPID software. CONTRAST:  155mL OMNIPAQUE IOHEXOL 350 MG/ML SOLN COMPARISON:  Noncontrast CT from earlier today FINDINGS: CTA NECK FINDINGS Aortic arch: 2 vessel arch.  Mild atheromatous plaque Right carotid system: Low-density plaque at the bifurcation without common carotid or ICA stenosis. No ulceration or dissection. Left carotid system: Low-density plaque at the bifurcation without stenosis or ulceration. Vertebral arteries: Subclavian atherosclerosis. Codominant vertebral arteries that are smoothly contoured and widely patent to the dura. Skeleton: No acute finding. Severe cervical spine degeneration with ridging causing foraminal an cord impingement. Other neck: Negative  Upper chest: Negative Review of the MIP images confirms the above findings CTA HEAD FINDINGS Anterior circulation: Mild atheromatous calcification at the siphons. Left M2 branch occlusion correlating with a hyperdensity by CT. No additional branch occlusion is seen. Negative for aneurysm. Posterior circulation: The vertebral and basilar arteries are smooth and widely patent. No branch occlusion, beading, or aneurysm. Venous sinuses: Negative Anatomic variants: Negative Review of the MIP images confirms the above findings CT Brain Perfusion Findings: ASPECTS: 6-7 CBF (<30%) Volume: 21mL Perfusion (Tmax>6.0s) volume: 81mL Mismatch Volume: 1mL Infarction Location:Anterior division left MCA territory Case discussed with Dr. Milas Gain while in progress. IMPRESSION: 1. Left M2 occlusion. CT perfusion reports 26 cc of core infarct with 17 cc of penumbra, but based on noncontrast head CT the penumbra of may be overestimated. 2. Overall mild atherosclerosis for age. No embolic source seen in the neck. Electronically Signed   By: Monte Fantasia M.D.   On: 07/15/2020 09:57   CT ANGIO NECK CODE STROKE  Result Date: 07/15/2020 CLINICAL DATA:  Code stroke with abnormal head CT EXAM: CT ANGIOGRAPHY HEAD AND NECK CT PERFUSION BRAIN TECHNIQUE: Multidetector CT imaging of the head and neck was performed using the standard protocol during bolus administration of intravenous contrast. Multiplanar CT image reconstructions and MIPs were obtained to evaluate the vascular anatomy. Carotid stenosis measurements (when applicable) are obtained utilizing NASCET criteria, using the distal internal carotid diameter as the denominator. Multiphase CT imaging of the brain was performed following IV bolus contrast injection. Subsequent parametric perfusion maps  were calculated using RAPID software. CONTRAST:  112mL OMNIPAQUE IOHEXOL 350 MG/ML SOLN COMPARISON:  Noncontrast CT from earlier today FINDINGS: CTA NECK FINDINGS Aortic arch: 2 vessel arch.   Mild atheromatous plaque Right carotid system: Low-density plaque at the bifurcation without common carotid or ICA stenosis. No ulceration or dissection. Left carotid system: Low-density plaque at the bifurcation without stenosis or ulceration. Vertebral arteries: Subclavian atherosclerosis. Codominant vertebral arteries that are smoothly contoured and widely patent to the dura. Skeleton: No acute finding. Severe cervical spine degeneration with ridging causing foraminal an cord impingement. Other neck: Negative Upper chest: Negative Review of the MIP images confirms the above findings CTA HEAD FINDINGS Anterior circulation: Mild atheromatous calcification at the siphons. Left M2 branch occlusion correlating with a hyperdensity by CT. No additional branch occlusion is seen. Negative for aneurysm. Posterior circulation: The vertebral and basilar arteries are smooth and widely patent. No branch occlusion, beading, or aneurysm. Venous sinuses: Negative Anatomic variants: Negative Review of the MIP images confirms the above findings CT Brain Perfusion Findings: ASPECTS: 6-7 CBF (<30%) Volume: 43mL Perfusion (Tmax>6.0s) volume: 49mL Mismatch Volume: 42mL Infarction Location:Anterior division left MCA territory Case discussed with Dr. Milas Gain while in progress. IMPRESSION: 1. Left M2 occlusion. CT perfusion reports 26 cc of core infarct with 17 cc of penumbra, but based on noncontrast head CT the penumbra of may be overestimated. 2. Overall mild atherosclerosis for age. No embolic source seen in the neck. Electronically Signed   By: Monte Fantasia M.D.   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 Does the need for close, 24 hr/day medical supervision in concert with the patient's rehab needs make it unreasonable for this patient to be served in a less intensive setting? Yes 2. Co-Morbidities requiring supervision/potential complications: aphasia, DM, BMI of 36, hx of colon CA and  partial colectomy- on anti-diarrheals; HTN, apraxia and B/B incontinence 3. Due to bladder management, bowel management, safety, skin/wound care, disease management, medication administration and patient education, does the patient require 24 hr/day rehab nursing? Yes 4. Does the patient require coordinated care of a physician, rehab nurse, therapy disciplines of PT, OT and SLP to address physical and functional deficits in the context of the above medical diagnosis(es)? Yes Addressing deficits in the following areas: balance, endurance, locomotion, strength, transferring, bowel/bladder control, bathing, dressing, feeding, grooming, toileting, cognition, speech and language 5. Can the patient actively participate in an intensive therapy program of at least 3 hrs of therapy per day at least 5 days per week? Yes 6. The potential for patient to make measurable gains while on inpatient rehab is good 7. Anticipated functional outcomes upon discharge from inpatient rehab are modified independent, supervision and min assist  with PT, supervision and min assist with OT, min assist with SLP. 8. Estimated rehab length of stay to reach the above functional goals is: 2-3 weeks 9. Anticipated discharge destination: Home 10. Overall Rehab/Functional Prognosis: good  RECOMMENDATIONS: This patient's condition is appropriate for continued rehabilitative care in the following setting: CIR Patient has agreed to participate in recommended program. Yes Note that insurance prior authorization may be required for reimbursement for recommended care.  Comment:  1. 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  2. Will send to admissions coordinators for admission 3. Thank you for this consult  Cathlyn Parsons, PA-C 07/17/2020

## 2020-07-18 ENCOUNTER — Inpatient Hospital Stay (HOSPITAL_COMMUNITY)
Admission: RE | Admit: 2020-07-18 | Discharge: 2020-08-03 | DRG: 056 | Disposition: A | Discharge status: Home / Self Care | Payer: Medicare Other | Source: Intra-hospital | Attending: Physical Medicine and Rehabilitation | Admitting: Physical Medicine and Rehabilitation

## 2020-07-18 ENCOUNTER — Other Ambulatory Visit: Payer: Self-pay | Admitting: Student

## 2020-07-18 ENCOUNTER — Inpatient Hospital Stay (HOSPITAL_COMMUNITY): Payer: Medicare Other

## 2020-07-18 ENCOUNTER — Encounter (HOSPITAL_COMMUNITY): Payer: Self-pay | Admitting: Physical Medicine and Rehabilitation

## 2020-07-18 ENCOUNTER — Other Ambulatory Visit: Payer: Self-pay

## 2020-07-18 DIAGNOSIS — I69320 Aphasia following cerebral infarction: Secondary | ICD-10-CM | POA: Diagnosis not present

## 2020-07-18 DIAGNOSIS — E7800 Pure hypercholesterolemia, unspecified: Secondary | ICD-10-CM | POA: Diagnosis not present

## 2020-07-18 DIAGNOSIS — I129 Hypertensive chronic kidney disease with stage 1 through stage 4 chronic kidney disease, or unspecified chronic kidney disease: Secondary | ICD-10-CM | POA: Diagnosis present

## 2020-07-18 DIAGNOSIS — N179 Acute kidney failure, unspecified: Secondary | ICD-10-CM | POA: Diagnosis present

## 2020-07-18 DIAGNOSIS — Z79899 Other long term (current) drug therapy: Secondary | ICD-10-CM | POA: Diagnosis not present

## 2020-07-18 DIAGNOSIS — H269 Unspecified cataract: Secondary | ICD-10-CM | POA: Diagnosis present

## 2020-07-18 DIAGNOSIS — E1142 Type 2 diabetes mellitus with diabetic polyneuropathy: Secondary | ICD-10-CM | POA: Diagnosis present

## 2020-07-18 DIAGNOSIS — E1165 Type 2 diabetes mellitus with hyperglycemia: Secondary | ICD-10-CM | POA: Diagnosis present

## 2020-07-18 DIAGNOSIS — I480 Paroxysmal atrial fibrillation: Secondary | ICD-10-CM

## 2020-07-18 DIAGNOSIS — E669 Obesity, unspecified: Secondary | ICD-10-CM | POA: Diagnosis present

## 2020-07-18 DIAGNOSIS — R400 Somnolence: Secondary | ICD-10-CM

## 2020-07-18 DIAGNOSIS — Z87442 Personal history of urinary calculi: Secondary | ICD-10-CM | POA: Diagnosis not present

## 2020-07-18 DIAGNOSIS — E785 Hyperlipidemia, unspecified: Secondary | ICD-10-CM | POA: Diagnosis present

## 2020-07-18 DIAGNOSIS — I69351 Hemiplegia and hemiparesis following cerebral infarction affecting right dominant side: Secondary | ICD-10-CM | POA: Diagnosis present

## 2020-07-18 DIAGNOSIS — E1169 Type 2 diabetes mellitus with other specified complication: Secondary | ICD-10-CM | POA: Diagnosis not present

## 2020-07-18 DIAGNOSIS — Z9049 Acquired absence of other specified parts of digestive tract: Secondary | ICD-10-CM | POA: Diagnosis not present

## 2020-07-18 DIAGNOSIS — K5900 Constipation, unspecified: Secondary | ICD-10-CM | POA: Diagnosis not present

## 2020-07-18 DIAGNOSIS — E1122 Type 2 diabetes mellitus with diabetic chronic kidney disease: Secondary | ICD-10-CM | POA: Diagnosis present

## 2020-07-18 DIAGNOSIS — F419 Anxiety disorder, unspecified: Secondary | ICD-10-CM | POA: Diagnosis present

## 2020-07-18 DIAGNOSIS — I4891 Unspecified atrial fibrillation: Secondary | ICD-10-CM

## 2020-07-18 DIAGNOSIS — R5383 Other fatigue: Secondary | ICD-10-CM | POA: Diagnosis present

## 2020-07-18 DIAGNOSIS — Z801 Family history of malignant neoplasm of trachea, bronchus and lung: Secondary | ICD-10-CM

## 2020-07-18 DIAGNOSIS — Z825 Family history of asthma and other chronic lower respiratory diseases: Secondary | ICD-10-CM

## 2020-07-18 DIAGNOSIS — N1832 Chronic kidney disease, stage 3b: Secondary | ICD-10-CM | POA: Diagnosis present

## 2020-07-18 DIAGNOSIS — I69391 Dysphagia following cerebral infarction: Secondary | ICD-10-CM

## 2020-07-18 DIAGNOSIS — E119 Type 2 diabetes mellitus without complications: Secondary | ICD-10-CM

## 2020-07-18 DIAGNOSIS — R300 Dysuria: Secondary | ICD-10-CM

## 2020-07-18 DIAGNOSIS — I63512 Cerebral infarction due to unspecified occlusion or stenosis of left middle cerebral artery: Secondary | ICD-10-CM | POA: Diagnosis not present

## 2020-07-18 DIAGNOSIS — I639 Cerebral infarction, unspecified: Secondary | ICD-10-CM | POA: Diagnosis not present

## 2020-07-18 DIAGNOSIS — R1310 Dysphagia, unspecified: Secondary | ICD-10-CM

## 2020-07-18 DIAGNOSIS — E1149 Type 2 diabetes mellitus with other diabetic neurological complication: Secondary | ICD-10-CM | POA: Diagnosis not present

## 2020-07-18 DIAGNOSIS — D72829 Elevated white blood cell count, unspecified: Secondary | ICD-10-CM

## 2020-07-18 DIAGNOSIS — R4701 Aphasia: Secondary | ICD-10-CM | POA: Diagnosis not present

## 2020-07-18 DIAGNOSIS — R7303 Prediabetes: Secondary | ICD-10-CM

## 2020-07-18 DIAGNOSIS — N183 Chronic kidney disease, stage 3 unspecified: Secondary | ICD-10-CM

## 2020-07-18 DIAGNOSIS — J157 Pneumonia due to Mycoplasma pneumoniae: Secondary | ICD-10-CM | POA: Diagnosis present

## 2020-07-18 DIAGNOSIS — Z6836 Body mass index (BMI) 36.0-36.9, adult: Secondary | ICD-10-CM

## 2020-07-18 DIAGNOSIS — G8191 Hemiplegia, unspecified affecting right dominant side: Secondary | ICD-10-CM

## 2020-07-18 DIAGNOSIS — I10 Essential (primary) hypertension: Secondary | ICD-10-CM

## 2020-07-18 DIAGNOSIS — Z85038 Personal history of other malignant neoplasm of large intestine: Secondary | ICD-10-CM | POA: Diagnosis not present

## 2020-07-18 LAB — ECHOCARDIOGRAM COMPLETE
AR max vel: 2.34 cm2
AV Area VTI: 2.37 cm2
AV Area mean vel: 2.28 cm2
AV Mean grad: 3 mmHg
AV Peak grad: 4.7 mmHg
Ao pk vel: 1.08 m/s
Height: 69.5 in
S' Lateral: 3.45 cm
Weight: 3984 [oz_av]

## 2020-07-18 LAB — CBC
HCT: 47 % (ref 39.0–52.0)
Hemoglobin: 14.9 g/dL (ref 13.0–17.0)
MCH: 30.7 pg (ref 26.0–34.0)
MCHC: 31.7 g/dL (ref 30.0–36.0)
MCV: 96.9 fL (ref 80.0–100.0)
Platelets: 215 10*3/uL (ref 150–400)
RBC: 4.85 MIL/uL (ref 4.22–5.81)
RDW: 12.9 % (ref 11.5–15.5)
WBC: 12 10*3/uL — ABNORMAL HIGH (ref 4.0–10.5)
nRBC: 0 % (ref 0.0–0.2)

## 2020-07-18 LAB — URINALYSIS, ROUTINE W REFLEX MICROSCOPIC
Bacteria, UA: NONE SEEN
Bilirubin Urine: NEGATIVE
Glucose, UA: NEGATIVE mg/dL
Ketones, ur: NEGATIVE mg/dL
Leukocytes,Ua: NEGATIVE
Nitrite: NEGATIVE
Protein, ur: NEGATIVE mg/dL
Specific Gravity, Urine: 1.014 (ref 1.005–1.030)
pH: 5 (ref 5.0–8.0)

## 2020-07-18 LAB — MAGNESIUM: Magnesium: 1.9 mg/dL (ref 1.7–2.4)

## 2020-07-18 LAB — COMPREHENSIVE METABOLIC PANEL
ALT: 13 U/L (ref 0–44)
AST: 18 U/L (ref 15–41)
Albumin: 3 g/dL — ABNORMAL LOW (ref 3.5–5.0)
Alkaline Phosphatase: 53 U/L (ref 38–126)
Anion gap: 8 (ref 5–15)
BUN: 25 mg/dL — ABNORMAL HIGH (ref 8–23)
CO2: 25 mmol/L (ref 22–32)
Calcium: 9.1 mg/dL (ref 8.9–10.3)
Chloride: 106 mmol/L (ref 98–111)
Creatinine, Ser: 1.81 mg/dL — ABNORMAL HIGH (ref 0.61–1.24)
GFR, Estimated: 38 mL/min — ABNORMAL LOW (ref >60)
Glucose, Bld: 171 mg/dL — ABNORMAL HIGH (ref 70–99)
Potassium: 4 mmol/L (ref 3.5–5.1)
Sodium: 139 mmol/L (ref 135–145)
Total Bilirubin: 1.4 mg/dL — ABNORMAL HIGH (ref 0.3–1.2)
Total Protein: 5.9 g/dL — ABNORMAL LOW (ref 6.5–8.1)

## 2020-07-18 LAB — GLUCOSE, CAPILLARY
Glucose-Capillary: 135 mg/dL — ABNORMAL HIGH (ref 70–99)
Glucose-Capillary: 149 mg/dL — ABNORMAL HIGH (ref 70–99)
Glucose-Capillary: 174 mg/dL — ABNORMAL HIGH (ref 70–99)

## 2020-07-18 IMAGING — RF DG ESOPHAGUS
6 series · 17 of 21 positions shown · non-contrast
Comparison: CT abdomen/pelvis [DATE].

CLINICAL DATA: Dysphagia. Additional history provided: Acute on
chronic trouble swallowing following acute stroke, also concern for
aspiration with vomiting yesterday.

EXAM:
ESOPHOGRAM / BARIUM SWALLOW / BARIUM TABLET STUDY
TECHNIQUE: A single contrast examination was performed using thick and thin
barium liquid. The patient attempted to swallow a 13 mm barium
sulphate tablet.
FLUOROSCOPY TIME:  Fluoroscopy Time: 2 minutes, 24 seconds (33.5
mGy).
Radiation Exposure Index (if provided by the fluoroscopic device):
33.50 mGy
Number of Acquired Spot Images: 1

[Series 1: cp_standard · 0.55mm/px · 3 of 67 frames shown (1 of 5)]
[frame 11/67]
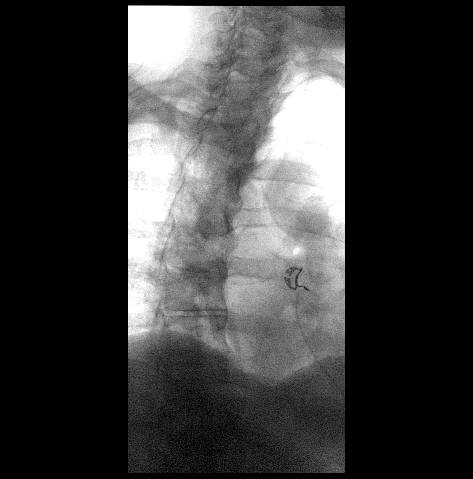
[frame 34/67]
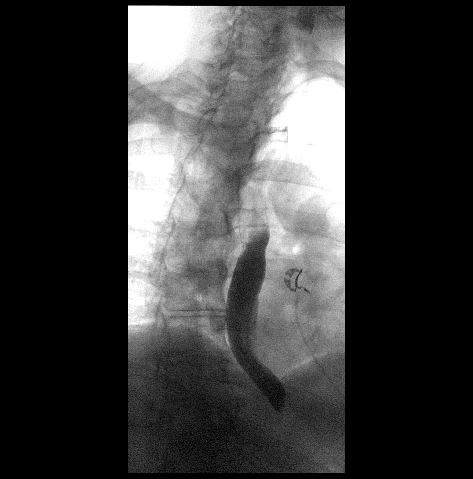
[frame 64/67]
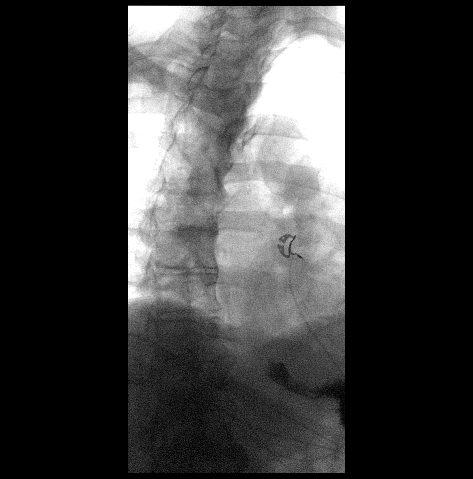

[Series 2: cp_standard · 0.37mm/px · 3 of 93 frames shown (2 of 5)]
[frame 14/93]
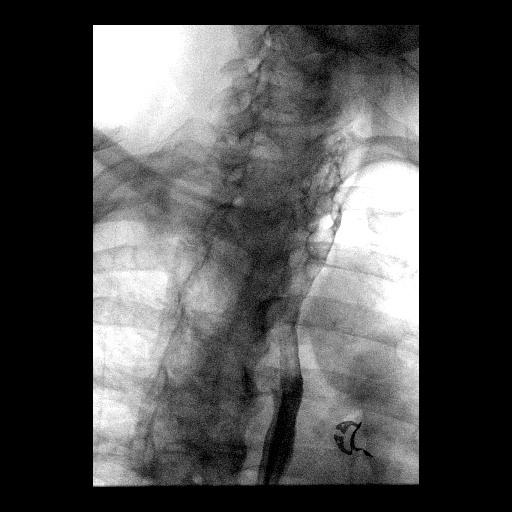
[frame 47/93]
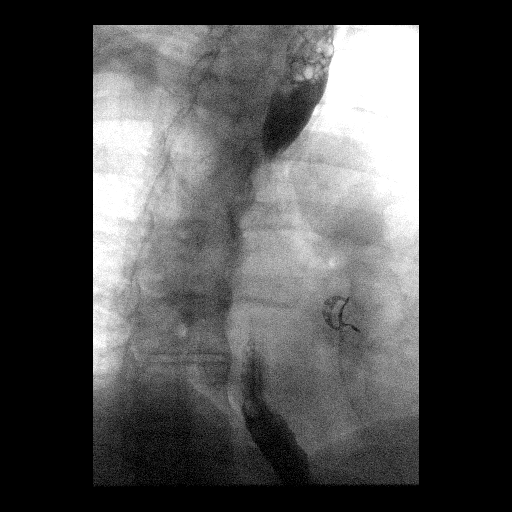
[frame 80/93]
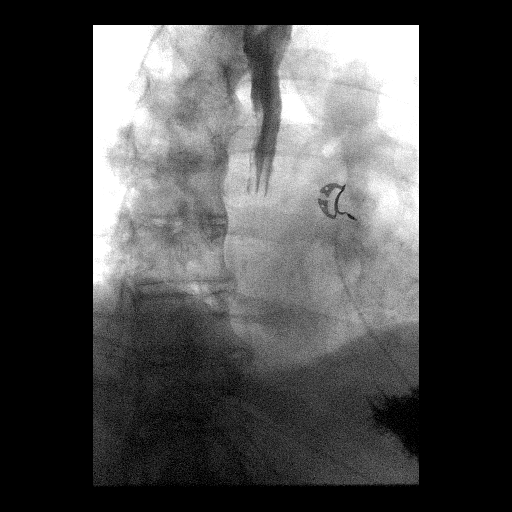

[Series 3: cp_standard · 0.37mm/px · 4 of 51 frames shown (3 of 5)]
[frame 8/51]
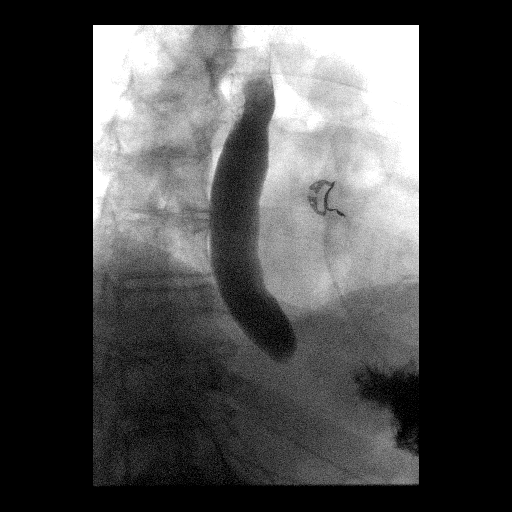
[frame 26/51]
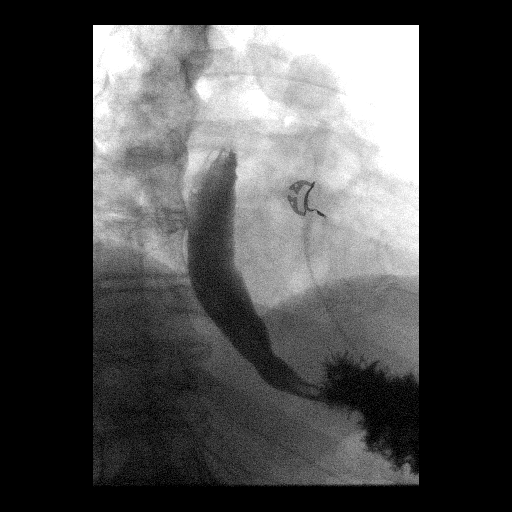
[frame 41/51]
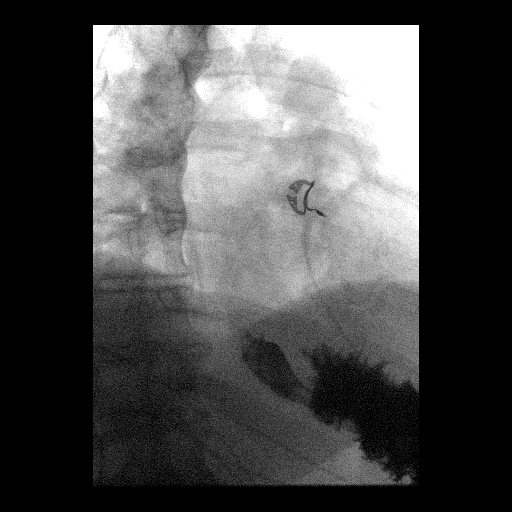
[frame 44/51]
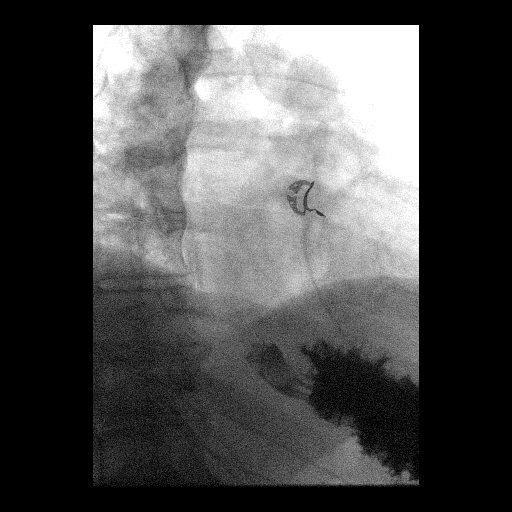

[Series 4: cp_standard · 0.37mm/px · 3 of 137 frames shown (4 of 5)]
[frame 21/137]
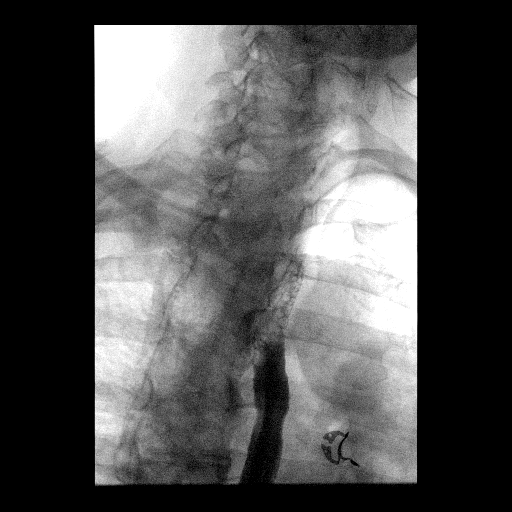
[frame 117/137]
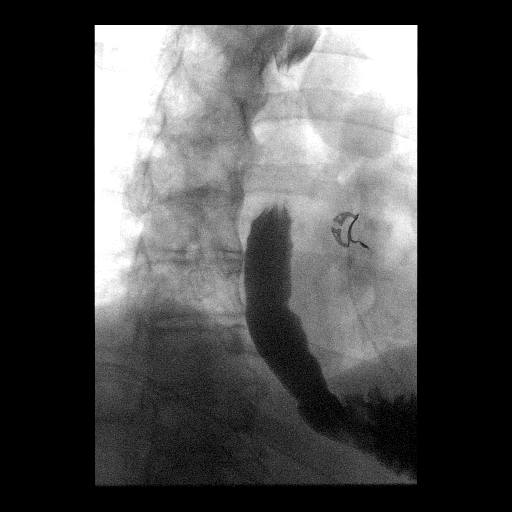
[frame 127/137]
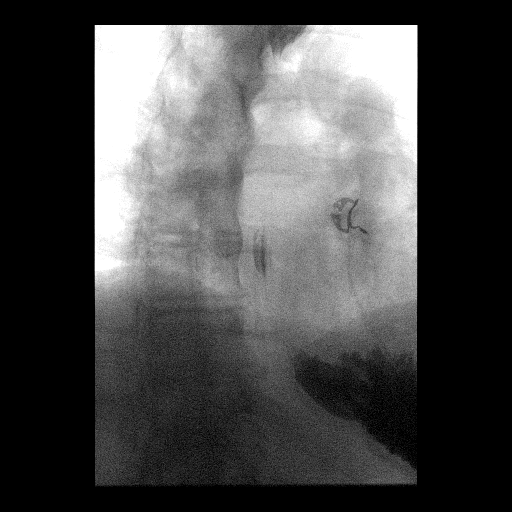

[Series 5: cp_standard · 0.37mm/px · 3 of 102 frames shown (5 of 5)]
[frame 8/102]
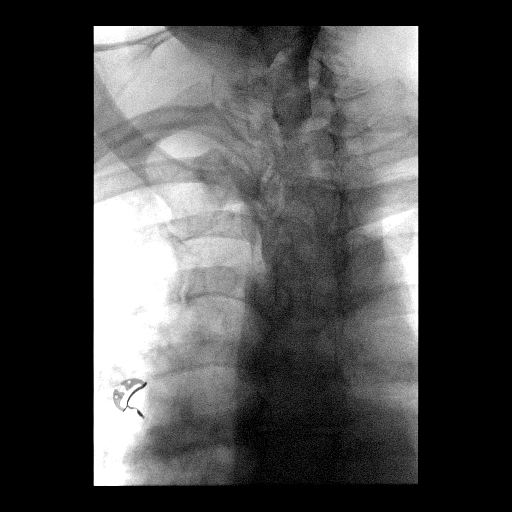
[frame 16/102]
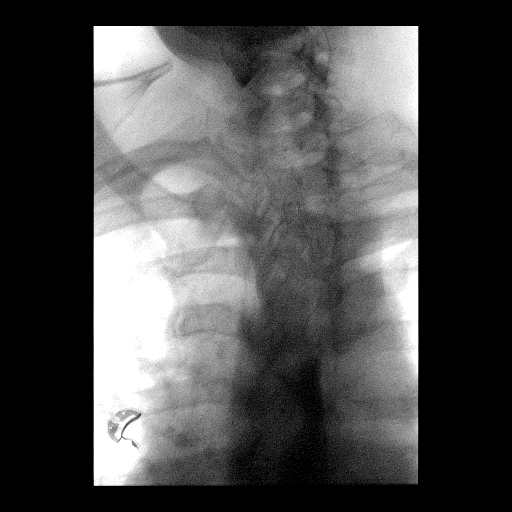
[frame 87/102]
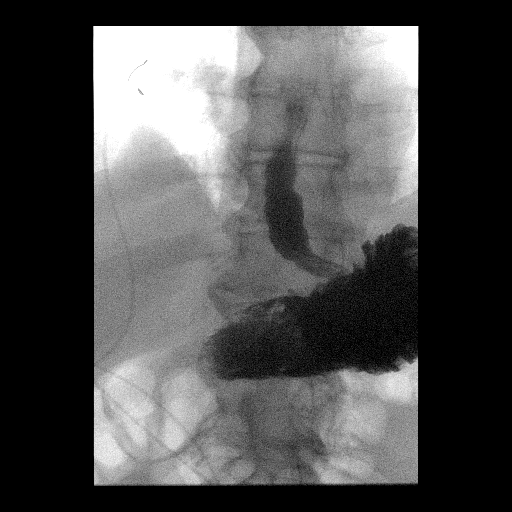

[Series 6: fluoro_barium 2fps_bw · 0.19mm/px · 1 of 1 slices shown]
[im 1/1]
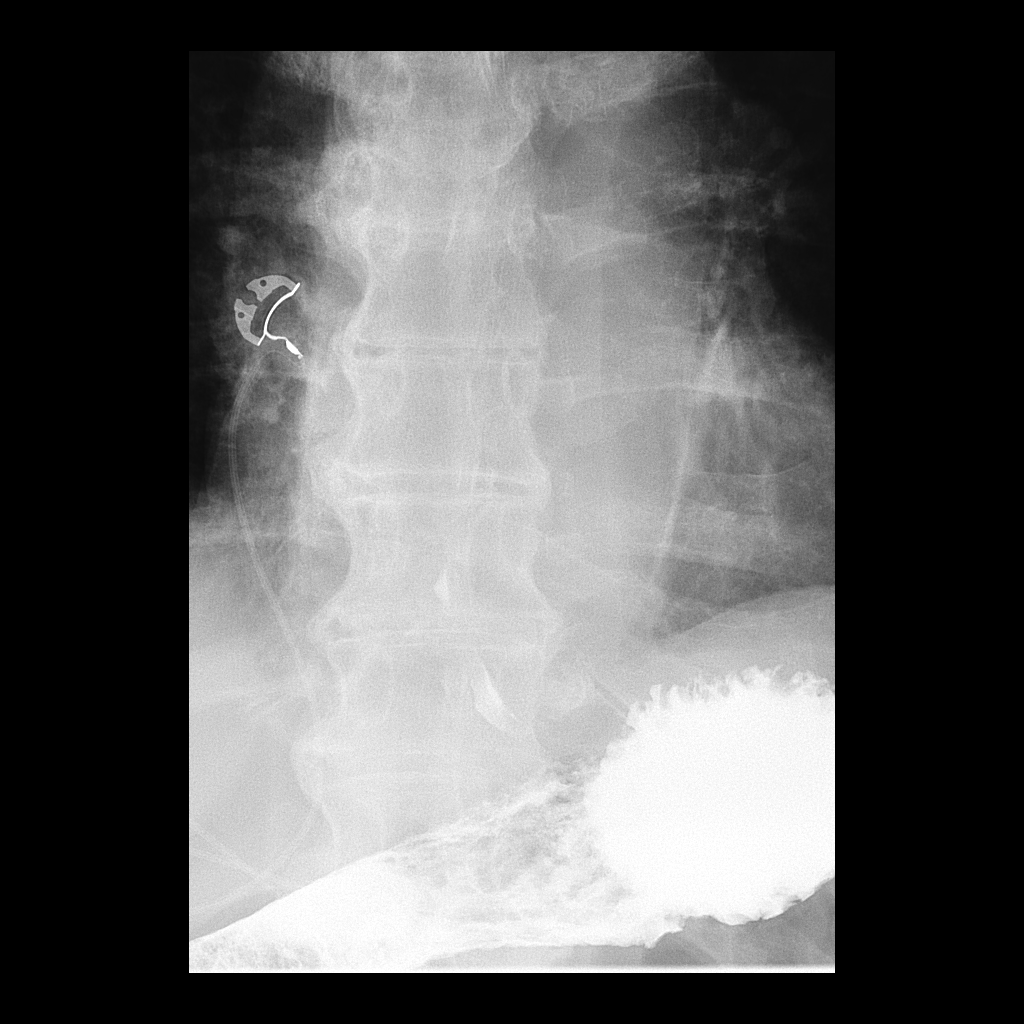

[17 of 21 positions shown; findings below may reference images not displayed]

FINDINGS: Somewhat limited evaluation due to limited patient mobility.
Fluoroscopic evaluation demonstrates a mildly patulous esophagus. No
evidence of fixed stricture, mass or mucosal abnormality.
Mild-to-moderate intermittent esophageal dysmotility with tertiary
contractions. No hiatal hernia identified. No gastroesophageal
reflux observed. The patient was unable to initiate swallowing of a
13 mm barium tablet.
IMPRESSION: Somewhat limited evaluation due to limited patient mobility.

Mildly patulous esophagus.

Mild-to-moderate intermittent esophageal dysmotility.

The patient was unable to initiate swallowing of a 13 mm barium
tablet.

Otherwise unremarkable esophagram, as described.

## 2020-07-18 MED ORDER — MAGNESIUM SULFATE 2 GM/50ML IV SOLN
2.0000 g | Freq: Once | INTRAVENOUS | Status: AC
  Administered 2020-07-18: 2 g via INTRAVENOUS
  Filled 2020-07-18: qty 50

## 2020-07-18 MED ORDER — ROSUVASTATIN CALCIUM 40 MG PO TABS
40.0000 mg | ORAL_TABLET | Freq: Every day | ORAL | Status: DC
Start: 2020-07-19 — End: 2020-08-01

## 2020-07-18 MED ORDER — ASPIRIN 300 MG RE SUPP: 300.0000 mg | Freq: Every day | RECTAL | Status: DC

## 2020-07-18 MED ORDER — ONDANSETRON HCL 4 MG PO TABS: 4.0000 mg | ORAL_TABLET | Freq: Three times a day (TID) | ORAL | Status: DC | PRN

## 2020-07-18 MED ORDER — LORAZEPAM 0.5 MG PO TABS
1.0000 mg | ORAL_TABLET | Freq: Every day | ORAL | Status: DC
  Administered 2020-07-19 – 2020-07-22 (×4): 1 mg via ORAL
  Filled 2020-07-18 (×4): qty 2

## 2020-07-18 MED ORDER — ACETAMINOPHEN 160 MG/5ML PO SOLN
650.0000 mg | ORAL | Status: DC | PRN
  Administered 2020-07-23: 650 mg
  Filled 2020-07-18: qty 20.3

## 2020-07-18 MED ORDER — COLESTIPOL HCL 1 G PO TABS
1.0000 g | ORAL_TABLET | Freq: Two times a day (BID) | ORAL | Status: DC
  Administered 2020-07-18 – 2020-08-03 (×32): 1 g via ORAL
  Filled 2020-07-18 (×32): qty 1

## 2020-07-18 MED ORDER — ROSUVASTATIN CALCIUM 20 MG PO TABS
40.0000 mg | ORAL_TABLET | Freq: Every day | ORAL | Status: DC
  Administered 2020-07-19 – 2020-08-03 (×16): 40 mg via ORAL
  Filled 2020-07-18 (×16): qty 2

## 2020-07-18 MED ORDER — ASPIRIN 325 MG PO TBEC: 325.0000 mg | DELAYED_RELEASE_TABLET | Freq: Every day | ORAL | Status: DC

## 2020-07-18 MED ORDER — ACETAMINOPHEN 325 MG PO TABS: 650.0000 mg | ORAL_TABLET | ORAL | Status: DC | PRN

## 2020-07-18 MED ORDER — ONDANSETRON HCL 4 MG PO TABS
4.0000 mg | ORAL_TABLET | Freq: Three times a day (TID) | ORAL | Status: DC | PRN
  Filled 2020-07-18: qty 1

## 2020-07-18 MED ORDER — SENNOSIDES-DOCUSATE SODIUM 8.6-50 MG PO TABS: 1.0000 | ORAL_TABLET | Freq: Every evening | ORAL | Status: DC | PRN

## 2020-07-18 MED ORDER — ACETAMINOPHEN 650 MG RE SUPP: 650.0000 mg | RECTAL | Status: DC | PRN

## 2020-07-18 MED ORDER — COLESTIPOL HCL 1 G PO TABS: 1.0000 g | ORAL_TABLET | Freq: Two times a day (BID) | ORAL | Status: DC

## 2020-07-18 MED ORDER — ASPIRIN EC 325 MG PO TBEC: 325.0000 mg | DELAYED_RELEASE_TABLET | Freq: Every day | ORAL | Status: DC

## 2020-07-18 MED ORDER — ACETAMINOPHEN 325 MG PO TABS
650.0000 mg | ORAL_TABLET | ORAL | Status: DC | PRN
  Administered 2020-07-26: 650 mg via ORAL
  Filled 2020-07-18 (×2): qty 2

## 2020-07-18 MED ORDER — INSULIN ASPART 100 UNIT/ML IJ SOLN
0.0000 [IU] | Freq: Three times a day (TID) | INTRAMUSCULAR | Status: DC
  Administered 2020-07-19: 2 [IU] via SUBCUTANEOUS
  Administered 2020-07-19: 3 [IU] via SUBCUTANEOUS
  Administered 2020-07-20 – 2020-07-21 (×5): 2 [IU] via SUBCUTANEOUS
  Administered 2020-07-21: 3 [IU] via SUBCUTANEOUS
  Administered 2020-07-22: 2 [IU] via SUBCUTANEOUS
  Administered 2020-07-22 – 2020-07-23 (×3): 3 [IU] via SUBCUTANEOUS
  Administered 2020-07-23: 8 [IU] via SUBCUTANEOUS
  Administered 2020-07-23: 3 [IU] via SUBCUTANEOUS
  Administered 2020-07-24: 5 [IU] via SUBCUTANEOUS
  Administered 2020-07-24 (×2): 3 [IU] via SUBCUTANEOUS
  Administered 2020-07-25: 2 [IU] via SUBCUTANEOUS
  Administered 2020-07-25: 3 [IU] via SUBCUTANEOUS
  Administered 2020-07-25 – 2020-07-26 (×4): 2 [IU] via SUBCUTANEOUS
  Administered 2020-07-27 (×3): 3 [IU] via SUBCUTANEOUS
  Administered 2020-07-28: 2 [IU] via SUBCUTANEOUS
  Administered 2020-07-28: 3 [IU] via SUBCUTANEOUS
  Administered 2020-07-29: 2 [IU] via SUBCUTANEOUS
  Administered 2020-07-29: 3 [IU] via SUBCUTANEOUS
  Administered 2020-07-30 (×3): 2 [IU] via SUBCUTANEOUS
  Administered 2020-07-31 (×2): 3 [IU] via SUBCUTANEOUS
  Administered 2020-07-31: 2 [IU] via SUBCUTANEOUS
  Administered 2020-08-01: 3 [IU] via SUBCUTANEOUS
  Administered 2020-08-01 – 2020-08-02 (×2): 2 [IU] via SUBCUTANEOUS
  Administered 2020-08-02: 3 [IU] via SUBCUTANEOUS
  Administered 2020-08-02 – 2020-08-03 (×2): 2 [IU] via SUBCUTANEOUS

## 2020-07-18 MED ORDER — LORAZEPAM 1 MG PO TABS: 1.0000 mg | ORAL_TABLET | Freq: Every day | ORAL | Status: DC

## 2020-07-18 MED ORDER — ACETAMINOPHEN 650 MG RE SUPP
650.0000 mg | RECTAL | 0 refills | Status: DC | PRN
Start: 2020-07-18 — End: 2020-08-03

## 2020-07-18 MED ORDER — INSULIN ASPART 100 UNIT/ML IJ SOLN: 0.0000 [IU] | Freq: Three times a day (TID) | INTRAMUSCULAR | 11 refills | Status: DC

## 2020-07-18 MED ORDER — COLESTIPOL HCL 1 G PO TABS
1.0000 g | ORAL_TABLET | Freq: Two times a day (BID) | ORAL | Status: DC
  Administered 2020-07-18: 1 g via ORAL
  Filled 2020-07-18 (×2): qty 1

## 2020-07-18 MED ORDER — ACETAMINOPHEN 160 MG/5ML PO SOLN: 650.0000 mg | ORAL | 0 refills | Status: DC | PRN

## 2020-07-18 MED ORDER — LOSARTAN POTASSIUM 25 MG PO TABS: ORAL_TABLET | ORAL | Status: DC

## 2020-07-18 MED ORDER — APIXABAN 5 MG PO TABS: 5.0000 mg | ORAL_TABLET | Freq: Two times a day (BID) | ORAL | Status: DC

## 2020-07-18 MED ORDER — SENNOSIDES-DOCUSATE SODIUM 8.6-50 MG PO TABS
1.0000 | ORAL_TABLET | Freq: Every evening | ORAL | Status: DC | PRN
  Administered 2020-07-22 – 2020-07-28 (×2): 1 via ORAL
  Filled 2020-07-18 (×2): qty 1

## 2020-07-18 NOTE — Progress Notes (Signed)
Occupational Therapy Treatment Patient Details Name: Edwin Wall MRN: [id number] DOB: 1940/07/07 Today's Date: 07/18/2020    History of present illness 80 yo male presents to the ED on 5/25 with speech difficutlies and facial droop. 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 PMH including anxiety, arthritis, colon cancer (dx 2018), HTN, obesity, and pre-daibetes.   OT comments  Patient continues to progress with patient focused OT goals.  Patient's balance has improved needing up to [place] with no AD needed.  He continues to lose his balance/side step to the right, but is able to right himself.  He is still struggling with orientation, safety and command following, but is able to complete functional tasks with demo and cueing. The patient is scheduled to progress to CIR this afternoon, but OT will follow in the acute setting to maximize function.    Follow Up Recommendations  CIR    Equipment Recommendations  None recommended by OT    Recommendations for Other Services      Precautions / Restrictions Precautions Precautions: Fall Restrictions Weight Bearing Restrictions: No       Mobility Bed Mobility Overal bed mobility: Needs Assistance Bed Mobility: Supine to Sit     Supine to sit: Supervision     General bed mobility comments: minA for trunk elevation, multimodal cueing for sequencing Patient Response: Cooperative  Transfers Overall transfer level: Needs assistance Equipment used: None Transfers: Sit to/from Stand Sit to Stand: Min guard         General transfer comment: minA to power and for safety as pt impulsive    Balance Overall balance assessment: Needs assistance Sitting-balance support: No upper extremity supported;Feet supported Sitting balance-Leahy Scale: Good     Standing balance support: No upper extremity supported;During functional  activity Standing balance-Leahy Scale: Fair Standing balance comment: continues to lose balance to R - side steps noted.                           ADL either performed or assessed with clinical judgement   ADL       Grooming: Brushing hair;Oral care;Standing;Set up               Lower Body Dressing: Minimal assistance;Sit to/from stand Lower Body Dressing Details (indicate cue type and reason): don socks             Functional mobility during ADLs: Min guard General ADL Comments: Pt with decreased cognition, balance, safety, and awerness     Vision Patient Visual Report: No change from baseline     Perception     Praxis      Cognition Arousal/Alertness: Awake/alert Behavior During Therapy: WFL for tasks assessed/performed Overall Cognitive Status: Difficult to assess Area of Impairment: Attention;Awareness;Problem solving;Following commands;Safety/judgement;Memory;Orientation                 Orientation Level: Disoriented to;Place;Situation Current Attention Level: Sustained;Selective Memory: Decreased short-term memory Following Commands: Follows one step commands inconsistently;Follows one step commands with increased time Safety/Judgement: Decreased awareness of safety;Decreased awareness of deficits Awareness: Intellectual Problem Solving: Slow processing;Requires verbal cues;Requires tactile cues General Comments: unable to identify place or situation. Requires increased time and cueing for sequencing. Easily distracted        Exercises Exercises: General Lower Extremity;Other exercises General Exercises - Lower Extremity Long Arc Quad: Both;10 reps;Seated Hip Flexion/Marching: Both;10 reps;Standing Other Exercises Other Exercises: Sit to stand  x 10   Shoulder Instructions       General Comments      Pertinent Vitals/ Pain       Pain Assessment: No/denies pain Faces Pain Scale: No hurt Pain Intervention(s): Monitored during  session                                                          Frequency  Min 2X/week        Progress Toward Goals  OT Goals(current goals can now be found in the care plan section)  Progress towards OT goals: Progressing toward goals  Acute Rehab OT Goals Patient Stated Goal: Go home safely OT Goal Formulation: With patient Time For Goal Achievement: 07/30/20 Potential to Achieve Goals: Good  Plan      Co-evaluation                 AM-PAC OT "6 Clicks" Daily Activity     Outcome Measure   Help from another person eating meals?: A Little Help from another person taking care of personal grooming?: A Little Help from another person toileting, which includes using toliet, bedpan, or urinal?: A Lot Help from another person bathing (including washing, rinsing, drying)?: A Little Help from another person to put on and taking off regular upper body clothing?: A Little Help from another person to put on and taking off regular lower body clothing?: A Little 6 Click Score: 17    End of Session Equipment Utilized During Treatment: Gait belt  OT Visit Diagnosis: Unsteadiness on feet (R26.81);Other abnormalities of gait and mobility (R26.89);Muscle weakness (generalized) (M62.81);Other symptoms and signs involving cognitive function   Activity Tolerance Patient tolerated treatment well   Patient Left in chair;with call bell/phone within reach;with chair alarm set   Nurse Communication          Time: [phone] OT Time Calculation (min): 19 min  Charges: OT General Charges $OT Visit: 1 Visit OT Treatments $Self Care/Home Management : 8-22 mins  07/18/2020  Rich, OTR/L  Acute Rehabilitation Services  Office:  [place] 07/18/2020, 3:28 PM

## 2020-07-18 NOTE — Progress Notes (Addendum)
Inpatient Rehab Admissions Coordinator:   I have a bed available for pt to admit to CIR today.  Per Dr. Konrad Penta, awaiting results of echo and esophogram.  Will potentially be ready to admit this afternoon and they will confirm once able.  I will update family at the bedside.   Addendum [id number]: Per Dr. Konrad Penta, pt is ready to admit to CIR today.  Will let pt/family know.   Shann Medal, PT, DPT Admissions Coordinator [phone] 07/18/20  12:02 PM

## 2020-07-18 NOTE — Discharge Instructions (Signed)
Cognitive Rehabilitation After a Stroke After a stroke, you may have various problems with thinking (cognitive disability). The types of problems you have will depend on how severe the stroke was and where it was located in the brain. Problems may include:  Problems with short-term memory.  Trouble paying attention.  Trouble communicating or understanding language (aphasia).  A drop in mental ability that may interfere with daily life (dementia).  Trouble with problem-solving and information processing.  Problems with reading, writing, or math.  Problems with your ability to plan and to perform activities in sequence (executive function). These problems can feel overwhelming. However, with rehabilitation and time to heal, many people have improvement in their symptoms. What causes cognitive disability? A stroke happens when blood cannot flow to certain areas of the brain. When this happens, brain cells die in the affected areas because they cannot get oxygen and nutrients from the blood. Cognitive disability is caused by the death of cells in the areas of the brain that control thinking. What is cognitive rehabilitation? Cognitive rehabilitation is a program to help you improve your thinking skills after a stroke. Rehabilitation cannot completely reverse the effects of a stroke, but it can help you with memory, problem-solving, and communication skills. Therapy focuses on:  Improving brain function. This may involve activities such as learning to break down tasks into simple steps.  Helping you learn ways to cope with thinking problems. For example, you might learn memory tricks or do activities that stimulate memory, such as naming objects or describing pictures. Cognitive rehabilitation may include:  Speech-language therapy to help you understand and use language to communicate.  Occupational therapy to help you perform daily activities.  Music therapy to help relieve stress,  anxiety, and depression. This may involve listening to music, singing, or playing instruments.  Physical therapy to help improve your ability to move and perform actions that involve the muscles (motor functions).   When will therapy start and where will I have therapy? Your health care provider will decide when it is best for you to start therapy. In some cases, people start rehabilitation as soon as their health is stable, which may be 24-48 hours after the stroke. Rehabilitation can take place in a few different places, based on your needs. It may take place in:  The hospital or an in-patient rehabilitation hospital.  An outpatient rehabilitation facility.  A long-term care facility.  A community rehabilitation clinic.  Your home. What are some tools to help after a stroke? There are a number of tools and apps that you can use on your smartphone, personal computer, or tablet to help improve brain function. Some of these apps include:  Calendar reminders or alarm apps to help with memory.  Note-taking or sketch pad apps to help with memory or communication.  Text-to-speech apps that allow you to listen to what you are reading, which helps your ability to understanding text.  Picture dictionary or picture message apps to help with communication.  E-readers. These can highlight text as it is read aloud, which helps with listening and reading skills. How can my friends or family help during my rehabilitation? During your recovery, it is important that your friends and family members help you work toward more independence. Your caregivers should speak with your health care providers to learn how they can best help you during recovery. This may include working on speech-language or memory exercises at home, or helping with daily tasks and errands. If you have cognitive disability,  you may be at risk for injury or accidents at home, such as forgetting to turn off the stove. Friends and  family members can help ensure home safety by taking steps such as getting appliances with automatic shut-off features or storing dangerous objects in a secure place. What else should I know about cognitive rehabilitation after a stroke? Having trouble with memory and problem-solving can make you feel alone. You may also have mood changes, anxiety, or depression after a stroke. It is important to:  Stay connected with others through social groups, online support groups, or your community.  Talk to your friends, family, and caregivers about any emotional problems you are having.  Go to one-on-one or group therapy as suggested by your health care provider.  Stay physically active and exercise as often as suggested by your health care provider. Summary  After a stroke, some people have problems with thinking that affect attention, memory, language, communication, and problem-solving.  Cognitive rehabilitation is a program to help you regain brain function and learn skills to cope with thinking problems.  Rehabilitation cannot completely reverse the effects of a stroke, but it can help to improve quality of life.  Cognitive rehabilitation may include speech-language therapy, occupational therapy, music therapy, and physical therapy. This information is not intended to replace advice given to you by your health care provider. Make sure you discuss any questions you have with your health care provider. Document Revised: 06/09/2018 Document Reviewed: 05/23/2016 Elsevier Patient Education  [place].

## 2020-07-18 NOTE — Progress Notes (Signed)
Subjective:   This morning, [name] says he is doing okay. Denies further nausea or vomiting since yesterday. Says he has not felt dizzy. SLP noted that patient seems to have acute on chronic pill dysphagia. He denies choking episodes, SOB, or cough. He says he continues to have dysuria as well as lower abdominal pain but says he has had normal bowel movements. He repeated "I need to get well" multiple times. He agrees "yes" when told his speech and strength seem to be improving.    Objective:  Vital signs in last 24 hours: Vitals:   07/18/20 0107 07/18/20 0329 07/18/20 0922 07/18/20 1200  BP: (!) 146/96 (!) 145/91 131/87 129/87  Pulse: 92 85 73 82  Resp: 18 15 18 20   Temp: 98 F (36.7 C) 98.4 F (36.9 C) (!) 97.3 F (36.3 C) (!) 97.5 F (36.4 C)  TempSrc: Oral  Oral Oral  SpO2: 97% 96% 95% 97%  Weight:      Height:       General: Patient is obese. Resting comfortably in no acute distress.  Eyes: Sclera non-icteric. No conjunctival injection. Continues to have troubles with right-ward and upward eye movements. No nystagmus.  HENT: Moist mucus membranes.  Respiratory: Lungs are CTA bilaterally with normal effort, saturating well on room air.  Cardiovascular: Rate is normal. Rhythm is irregularly irregular. No murmurs, rubs, or gallops. There is trace bilateral LE pitting edema. MSK: I am unable to fully assess strength of his bilateral upper extremities although ROM is intact and exam is consistent with yesterday's.  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 Oriented to self but not situation. CN III-XII otherwise intact aside from eye exam as above.  Psych: Pleasant and cooperative.  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 His aphasia does vary significantly day-to-day although is improving overall. He says he is apprehensive about standing up and has not yet gotten out of bed. Likely occurred in the setting of new A-Fib (with RBBB) along with multiple uncontrolled risk factors including HTN, HLD, obesity and DM.  - Neurology following, appreciate their recommendations - Continue PT/OT evaluations with plan to admit to CIR this afternoon, pending esophagram  - Continue ASA 325mg  daily for now  - Continue fall precautions with frequent neuro checks   # Nausea / Vomiting, Resolved  # Possible Esophageal Dysphagia   SLP re-evaluated patient due to N/V yesterday. Symptoms may have been related to recent stroke and possible inhibition of frontal lobe. Repeat CT yesterday did not show new stroke and symptoms have resolved since; however, SLP noted acute on chronic pill dysphagia.  - Barium esophagram is pending  - Continue NPO for now and restart home colestipol 1g BID if able to tolerate PO  - Zofran 4mg  q8 hours PRN   # Leukocytosis, New # Dysuria  WBC increased from 7.9 to 12.0. Although we were initially most concerned for aspiration, he is saturating well on room air, in no distress, without symptoms, fever, and with clear lungs. He continues to  endorse dysuria with mild lower abdominal pain, concerning for UTI.  - Check urinalysis  - Urine culture if concerning for infection   # New Atrial Fibrillation  # New RBBB Initial EKG in the ED showed new A-Fib with RBBB. TSH normal. CHADs-VASc score is 5 and HASBLED score is 3. He is not on any home anticoagulation and denies any recent bleeding despite dx of colon cancer in 2018 with recurrent polyps since. ECHO  today shows EF 50-55% without regional WMA's with mild MR and mildly dilated LA which may be contributing. Otherwise unremarkable.  - Gave 2g Mag Sulfate IVPB (goal Mg >2, K 4-5) - Continue ASA alone for now - Will start Eliquis 07/20/20 for future stroke prevention; holding off on acutely due to large size / high risk infarct and holding other antiplatelet therapy  - Continuous telemetry monitoring   # HTN Blood pressures have improved since yesterday, now down to the 124-580'D systolic. He takes Benzapril 40mg  daily at home, prescribed recently by his PCP although this has been held in the setting of stroke.  - Given AKI will hold off on ACE/ARB today  - Will plan for addition of low-dose Losartan daily if pressures remain >983 systolic consistently - If creatinine does not decrease tomorrow, recommend starting amlodipine 5mg  daily instead - Goal of normalizing blood pressures <120/80   # New-Onset DM  Very likely T2DM. Hgb A1c this admission increased from 6.6 in 2018 to 7.1% this admission. He is not on any glucose-lowering agents at home. Sugars are slightly elevated although <180 consistently on moderate SSI. - Continue moderate SSI for now; increase if CBG's continue to rise to 180 - Would start on Metformin once renal function hopefully returns to baseline Cr ~1.45  # Anxiety Patient takes Lorazepam 1mg  once daily at home for anxiety that has been exacerbated recently.  - Continue home dose while admitted - Recommend trial of SSRI outpatient instead  # Hyperbilirubinemia, Improving  No upper abdominal pain to suspect hepatobiliary involvement - Likely due to hemolysis from stroke vs. Gilbert's Syndrome  - No further workup required currently  # AKI on CKD Stage 3, Unspecified whether a or b Creatinine did trend up from 1.47 to 1.81 today with GFR down to 38. I highly suspect this is pre-renal due to decreased PO intake due to stroke as well as NPO status currently and vomiting  yesterday.  - Continue to trend daily renal function   Code Status: Full Code  Diet: CM/HH IVF: None  DVT PPx: SCD's Prior to Admission Living Arrangement: Home Anticipated Discharge Location: CIR  Barriers to Discharge: Esophagram pending - otherwise stable for d/c to CIR   Jeralyn Bennett, MD 07/18/2020, 1:43 PM Pager: [phone] After 5pm on weekdays and 1pm on weekends: On Call pager [phone]

## 2020-07-18 NOTE — TOC Transition Note (Signed)
Transition of Care [facility]) - CM/SW Discharge Note   Patient Details  Name: Edwin Wall MRN: [id number] Date of Birth: 1941-01-04  Transition of Care [facility]) CM/SW Contact:  Pollie Friar, RN Phone Number: 07/18/2020, 10:32 AM   Clinical Narrative:    Patient is discharging to CIR today. CM signing off.   Final next level of care: IP Rehab Facility Barriers to Discharge: No Barriers Identified   Patient Goals and CMS Choice        Discharge Placement                       Discharge Plan and Services                                     Social Determinants of Health (SDOH) Interventions     Readmission Risk Interventions No flowsheet data found.

## 2020-07-18 NOTE — PMR Pre-admission (Addendum)
PMR Admission Coordinator Pre-Admission Assessment  Patient: Edwin Wall is an 80 y.o., male MRN: [id number] DOB: 03-24-1940 Height: 5' 9.5" (176.5 cm) Weight: 112.9 kg              Insurance Information HMO:     PPO:      PCP:      IPA:      80/20:      OTHER:  PRIMARY: Medicare A and B      Policy#: [id number]      Subscriber: pt CM Name:       Phone#:      Fax#:  Pre-Cert#: verified Civil engineer, contracting:  Benefits:  Phone #:      Name:  Eff. Date: A 12/01/05; B 09/01/07     Deduct: $1556      Out of Pocket Max: n/a      Life Max: n/a  CIR: 100%      SNF: 20 full days Outpatient: 80%     Co-Ins: 20% Home Health: 100%      Co-Pay:  DME: 80%     Co-Ins: 20% Providers: pt choice SECONDARY: [employer]      Policy#: [id number]      Phone#:   Financial Counselor:       Phone#:   The "Data Collection Information Summary" for patients in Inpatient Rehabilitation Facilities with attached "Privacy Act [place] Records" was provided and verbally reviewed with: Patient and Family  Emergency Contact Information Contact Information     Name Relation Home Work Mobile   [name],[name] Spouse   [phone]      Current Medical History  Patient Admitting Diagnosis: L MCA   History of Present Illness: [name] present is a 80 year old right-handed male with history of prediabetes, hypertension, colon cancer with colectomy 2018, obesity with BMI 36.24, anxiety.   Presented 07/15/2020 to [facility] with acute onset of right side weakness speech difficulties and facial droop.  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  No acute hemorrhage.  Patient did not receive tPA.  CT angiogram of head and neck showed left M2 occlusion.  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  No evidence of  hemorrhagic conversion no significant mass-effect.  Admission chemistries unremarkable except glucose 155, creatinine 1.52, SARS coronavirus negative, hemoglobin A1c 7.1, TSH within normal limits.  Hospital course new onset atrial fibrillation with cardiac rate controlled.  Echocardiogram pending.  Currently maintained on aspirin 325 mg daily awaiting plan to possibly begin Eliquis 5/20 - 07/20/2020 for further stroke prevention holding off acutely due to large size of high risk infarct.  Tolerating a regular consistency diet however there was reports of some dysphagia with pills as well as bouts of nausea vomiting and awaiting plan for follow-up swallow study versus esophagram..  Therapy evaluations completed due to patient's right side weakness decreased functional mobility was recommended for a comprehensive rehab program.  Complete NIHSS TOTAL: 5 Glasgow Coma Scale Score: 14  Past Medical History  Past Medical History:  Diagnosis Date   Anxiety    Arthritis    Cataract    Colon cancer ([place]) dx'd 11/2016   "stage 3"   Gout    Gout    Hemorrhoids    History of kidney stones    "passed it"  Hypertension    Pre-diabetes     Family History  family history includes COPD in his father; Cancer in his mother.  Prior Rehab/Hospitalizations:  Has the patient had prior rehab or hospitalizations prior to admission? No  Has the patient had major surgery during 100 days prior to admission? No  Current Medications   Current Facility-Administered Medications:    0.9 %  sodium chloride infusion, , Intravenous, Continuous, Rosalin Hawking, MD, Last Rate: 50 mL/hr at 07/18/20 0349, New Bag at 07/18/20 0349   acetaminophen (TYLENOL) tablet 650 mg, 650 mg, Oral, Q4H PRN **OR** acetaminophen (TYLENOL) 160 MG/5ML solution 650 mg, 650 mg, Per Tube, Q4H PRN **OR** acetaminophen (TYLENOL) suppository 650 mg, 650 mg, Rectal, Q4H PRN, Aslam, Sadia, MD   aspirin EC tablet 325 mg, 325 mg, Oral, Daily, 325 mg at  07/18/20 1001 **OR** aspirin suppository 300 mg, 300 mg, Rectal, Daily, Rosalin Hawking, MD, 300 mg at 07/16/20 1007   colestipol (COLESTID) tablet 1 g, 1 g, Oral, BID, Jeralyn Bennett, MD   insulin aspart (novoLOG) injection 0-15 Units, 0-15 Units, Subcutaneous, TID WC, Aslam, Sadia, MD, 2 Units at 07/18/20 1320   LORazepam (ATIVAN) tablet 1 mg, 1 mg, Oral, Daily, Jeralyn Bennett, MD, 1 mg at 07/18/20 1001   ondansetron (ZOFRAN) tablet 4 mg, 4 mg, Oral, Q8H PRN, Jeralyn Bennett, MD   rosuvastatin (CRESTOR) tablet 40 mg, 40 mg, Oral, Daily, Jeralyn Bennett, MD, 40 mg at 07/18/20 1001   senna-docusate (Senokot-S) tablet 1 tablet, 1 tablet, Oral, QHS PRN, Harvie Heck, MD  Patients Current Diet:  Diet Order             Diet NPO time specified  Diet effective now                   Precautions / Restrictions Precautions Precautions: Fall Restrictions Weight Bearing Restrictions: No   Has the patient had 2 or more falls or a fall with injury in the past year?No  Prior Activity Level Community (5-7x/wk): independent prior to admission, driving  Prior Functional Level Prior Function Level of Independence: Independent Comments: ADLs, IADLs, and driving  Self Care: Did the patient need help bathing, dressing, using the toilet or eating?  Independent  Indoor Mobility: Did the patient need assistance with walking from room to room (with or without device)? Independent  Stairs: Did the patient need assistance with internal or external stairs (with or without device)? Independent  Functional Cognition: Did the patient need help planning regular tasks such as shopping or remembering to take medications? Independent  Home Assistive Devices / Equipment Home Equipment: Shower seat  Prior Device Use: Indicate devices/aids used by the patient prior to current illness, exacerbation or injury? None of the above  Current Functional Level Cognition  Arousal/Alertness:  Awake/alert Overall Cognitive Status: Difficult to assess Difficult to assess due to: Impaired communication (aphasia) Current Attention Level: Sustained,Selective Orientation Level: Oriented to person,Disoriented to place,Disoriented to time,Disoriented to situation Following Commands: Follows one step commands inconsistently,Follows one step commands with increased time Safety/Judgement: Decreased awareness of safety,Decreased awareness of deficits General Comments: pt able to state name and hospital today but stated [place] and was unable to state date or choose correct date when given choices, pt with noted R sided inattention and remains easily distracted, hard time sequencing washing hands at sink, forgot to turn water off, unable to find paper towels on the R despite max verbal cues Attention: Sustained,Alternating Sustained Attention: Appears intact Alternating Attention: Appears intact Memory:  (  UTA) Awareness: Impaired Awareness Impairment: Emergent impairment Problem Solving: Appears intact Safety/Judgment: Appears intact    Extremity Assessment (includes Sensation/Coordination)  Upper Extremity Assessment: Defer to OT evaluation  Lower Extremity Assessment: Generalized weakness    ADLs  Overall ADL's : Needs assistance/impaired Eating/Feeding: Set up,Sitting Grooming: Brushing hair,Oral care,Min guard,Standing Upper Body Bathing: Minimal assistance,Sitting Lower Body Bathing: Minimal assistance,Sit to/from stand Upper Body Dressing : Minimal assistance,Sitting Lower Body Dressing: Minimal assistance,Sit to/from stand Lower Body Dressing Details (indicate cue type and reason): don socks Toilet Transfer: Minimal assistance,Regular Toilet,Ambulation Toileting- Clothing Manipulation and Hygiene: Moderate assistance,Sit to/from stand Toileting - Clothing Manipulation Details (indicate cue type and reason): Pt unaware that while performing peri care he was caught on his gown;  unsuccessful at posterior peri care Functional mobility during ADLs: Minimal assistance General ADL Comments: Pt with decreased cognition, balance, and safety    Mobility  Overal bed mobility: Needs Assistance Bed Mobility: Supine to Sit Supine to sit: Min assist General bed mobility comments: minA for trunk elevation, max verbal cues to complete task    Transfers  Overall transfer level: Needs assistance Equipment used: None Transfers: Sit to/from Stand Sit to Stand: Min assist General transfer comment: minA to power and for safety as pt impulsive    Ambulation / Gait / Stairs / Wheelchair Mobility  Ambulation/Gait Ambulation/Gait assistance: Herbalist (Feet): 20 Feet (to/from bathroom) Assistive device: None Gait Pattern/deviations: Step-through pattern,Drifts right/left General Gait Details: pt with lateral sway L/R, unsteady, hard to navigate given instructions and around obstacles, pt with loose stool incontinence in room, pt assisted to the bathroom but ultimately unable to amb in hallway due to loose stool Gait velocity: decr    Posture / Balance Dynamic Sitting Balance Sitting balance - Comments: able to anteriorly translate trunk to don socks Balance Overall balance assessment: Needs assistance Sitting-balance support: No upper extremity supported,Feet supported Sitting balance-Leahy Scale: Good Sitting balance - Comments: able to anteriorly translate trunk to don socks Standing balance support: No upper extremity supported,During functional activity Standing balance-Leahy Scale: Fair Standing balance comment: cannot accept challenge    Special needs/care consideration Diabetic management yes     Previous Home Environment (from acute therapy documentation) Living Arrangements: Spouse/significant other,Children Available Help at Discharge: Family,Available 24 hours/day Type of Home: House Home Layout: One level Home Access: Stairs to  enter Entrance Stairs-Rails: Right,Left,Can reach both Entrance Stairs-Number of Steps: 3-4 Bathroom Shower/Tub: Ambulance person: Handicapped height  Discharge Living Setting Plans for Discharge Living Setting: Patient's home Type of Home at Discharge: House Discharge Home Layout: One level Discharge Home Access: Stairs to enter Entrance Stairs-Rails: Can reach both Entrance Stairs-Number of Steps: 3.5+1 Discharge Bathroom Shower/Tub: Tub/shower unit,Walk-in shower Discharge Bathroom Toilet: Handicapped height Discharge Bathroom Accessibility: Yes How Accessible: Accessible via walker Does the patient have any problems obtaining your medications?: No  Social/Family/Support Systems Patient Roles: Spouse Anticipated Caregiver: wife, Alexis Abney Anticipated Caregiver's Contact Information: [phone] Ability/Limitations of Caregiver: min assist Caregiver Availability: 24/7 Discharge Plan Discussed with Primary Caregiver: Yes Is Caregiver In Agreement with Plan?: Yes Does Caregiver/Family have Issues with Lodging/Transportation while Pt is in Rehab?: No   Goals Patient/Family Goal for Rehab: PT/OT supervision to mod I, SLP min assist to supervision Expected length of stay: 16-19 days Additional Information: pt's adult son also lives at home, he has intellectual disabilities but would be able to provide some help if needed.  He is able to care for himself. Pt/Family Agrees to Admission  and willing to participate: Yes Program Orientation Provided & Reviewed with Pt/Caregiver Including Roles  & Responsibilities: Yes   Decrease burden of Care through IP rehab admission: n/a  Possible need for SNF placement upon discharge: Not anticipated   Patient Condition: This patient's condition remains as documented in the consult dated 07/17/2020 , in which the Rehabilitation Physician determined and documented that the patient's condition is appropriate  for intensive rehabilitative care in an inpatient rehabilitation facility. Will admit to inpatient rehab today.  Preadmission Screen Completed By:  Michel Santee, PT, DPT 07/18/2020 2:05 PM ______________________________________________________________________   Discussed status with Dr. Posey Pronto on 07/18/20 at 2:05 PM  and received approval for admission today.  Admission Coordinator:  Michel Santee, PT, DPT  Time 2:05 Heidi Dach  Sudie Grumbling 07/18/20

## 2020-07-18 NOTE — Progress Notes (Signed)
Inpatient Rehabilitation  Patient information reviewed and entered into eRehab system by Yanessa Hocevar M. Murle Otting, M.A., CCC/SLP, PPS Coordinator.  Information including medical coding, functional ability and quality indicators will be reviewed and updated through discharge.    

## 2020-07-18 NOTE — Progress Notes (Addendum)
SLP Cancellation Note  Patient Details Name: TREMEL SETTERS MRN: [id number] DOB: 03/17/1940   Cancelled treatment:       Reason Eval/Treat Not Completed: Other (comment). New swallow eval requested by MD due to pt vomiting/regurgitating meal yesterday. SLP discussed with MD and suggested esophagram given pt and wife reported some baseline symptoms of globus, difficulty with pills, needing to eat slowly. This was ordered. Will plan to follow up after the results of this test are available; assessment also can be done by SLP on CIR, no need to delay admit.    Irvin Lizama, Katherene Ponto 07/18/2020, 10:35 AM

## 2020-07-18 NOTE — H&P (Signed)
Physical Medicine and Rehabilitation Admission H&P    Chief Complaint  Patient presents with  . Code Stroke  : HPI: Edwin Wall present is a 80 year old right-handed male with history of prediabetes, hypertension, colon cancer with colectomy 2018, obesity with BMI 36.24, anxiety.  History taken from chart review due to lethargy. Patient lives with spouse.  Independent prior to admission.  1 level home 3 steps to entry.  He presented on 07/15/2020 with acute right hemiparesis, dysarthria, and facial droop.  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  No acute hemorrhage.  Patient did not receive tPA.  CT angiogram of head and neck showed left M2 occlusion.  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  No evidence of hemorrhagic conversion no significant mass-effect.  Admission chemistries unremarkable except glucose 155, creatinine 1.52, SARS coronavirus negative, hemoglobin A1c 7.1, TSH within normal limits.  Hospital course further complicated by new onset atrial fibrillation with cardiac rate controlled.  Echocardiogram with ejection fraction 50-55%, no wall motion abnormalities..  Currently maintained on aspirin 325 mg daily awaiting plan to possibly begin Eliquis 5/20 - 07/20/2020 for further stroke prevention holding off acutely due to large size of high risk infarct.  Tolerating a regular consistency diet however there was reports of some dysphagia with pills as well as bouts of nausea vomiting and awaiting plan for follow-up swallow study versus esophagram that showed somewhat limited evaluation mild to moderate intermittent esophageal dysmotility otherwise unremarkable..  Therapy evaluations completed due to patient's right hemiparesis, decreased functional mobility was admitted for a comprehensive rehab  program.  Please see preadmission assessment from earlier today as well.  Review of Systems  Unable to perform ROS: Other  Extreme lethargy.  Past Medical History:  Diagnosis Date  . Anxiety   . Arthritis   . Cataract   . Colon cancer ([place]) dx'd 11/2016   "stage 3"  . Gout   . Gout   . Hemorrhoids   . History of kidney stones    "passed it"  . Hypertension   . Pre-diabetes    Past Surgical History:  Procedure Laterality Date  . COLECTOMY  12/17/2016   lap; partial sigmoid colectomy/notes 12/17/2016  . COLONOSCOPY W/ BIOPSIES AND POLYPECTOMY  11/11/2016  . LAPAROSCOPIC SIGMOID COLECTOMY [name] 12/17/2016   Procedure: LAPAROSCOPIC SIGMOID COLECTOMY;  Surgeon: [name], MD;  Location: [facility];  Service: General;  Laterality: [name];  . NASAL SEPTUM SURGERY    . TONSILLECTOMY     Family History  Problem Relation Age of Onset  . Cancer Mother        LUNG  . COPD Father    Social History:  reports that he has never smoked. He has never used smokeless tobacco. He reports that he does not drink alcohol and does not use drugs. Allergies: No Known Allergies Medications Prior to Admission  Medication Sig Dispense Refill  . benazepril (LOTENSIN) 40 MG tablet Take 40 mg by mouth daily.    . diclofenac (VOLTAREN) 75 MG EC tablet Take 75 mg by mouth daily as needed for mild pain.    Marland Kitchen LORazepam (ATIVAN) 1 MG tablet Take 1 mg by mouth daily as needed for anxiety.      Drug Regimen Review Drug regimen was reviewed and remains appropriate with no significant issues identified  Home: Home  Living Family/patient expects to be discharged to:: Private residence Living Arrangements: Spouse/significant other,Children Available Help at Discharge: Family,Available 24 hours/day Type of Home: House Home Access: Stairs to enter [employer] of Steps: 3-4 Entrance Stairs-Rails: Right,Left,Can reach both Home Layout: One level Bathroom Shower/Tub: Interior and spatial designer: Handicapped height Home Equipment: Industrial/product designer History: Prior Function Level of Independence: Independent Comments: ADLs, IADLs, and driving  Functional Status:  Mobility: Bed Mobility Overal bed mobility: Needs Assistance Bed Mobility: Supine to Sit Supine to sit: Min assist General bed mobility comments: minA for trunk elevation, max verbal cues to complete task Transfers Overall transfer level: Needs assistance Equipment used: None Transfers: Sit to/from Stand Sit to Stand: Min assist General transfer comment: minA to power and for safety as pt impulsive Ambulation/Gait Ambulation/Gait assistance: Min assist Gait Distance (Feet): 20 Feet (to/from bathroom) Assistive device: None Gait Pattern/deviations: Step-through pattern,Drifts right/left General Gait Details: pt with lateral sway L/R, unsteady, hard to navigate given instructions and around obstacles, pt with loose stool incontinence in room, pt assisted to the bathroom but ultimately unable to amb in hallway due to loose stool Gait velocity: decr    ADL: ADL Overall ADL's : Needs assistance/impaired Eating/Feeding: Set up,Sitting Grooming: Brushing hair,Oral care,Min guard,Standing Upper Body Bathing: Minimal assistance,Sitting Lower Body Bathing: Minimal assistance,Sit to/from stand Upper Body Dressing : Minimal assistance,Sitting Lower Body Dressing: Minimal assistance,Sit to/from stand Lower Body Dressing Details (indicate cue type and reason): don socks Toilet Transfer: Minimal assistance,Regular Toilet,Ambulation Toileting- Clothing Manipulation and Hygiene: Moderate assistance,Sit to/from stand Toileting - Clothing Manipulation Details (indicate cue type and reason): Pt unaware that while performing peri care he was caught on his gown; unsuccessful at posterior peri care Functional mobility during ADLs: Minimal assistance General ADL Comments: Pt with decreased cognition,  balance, and safety  Cognition: Cognition Overall Cognitive Status: Difficult to assess Arousal/Alertness: Awake/alert Orientation Level: Oriented to person Attention: Sustained,Alternating Sustained Attention: Appears intact Alternating Attention: Appears intact Memory:  (UTA) Awareness: Impaired Awareness Impairment: Emergent impairment Problem Solving: Appears intact Safety/Judgment: Appears intact Cognition Arousal/Alertness: Awake/alert Behavior During Therapy: WFL for tasks assessed/performed Overall Cognitive Status: Difficult to assess Area of Impairment: Attention,Awareness,Problem solving,Following commands,Safety/judgement,Memory,Orientation Orientation Level: Disoriented to,Place,Situation Current Attention Level: Sustained,Selective Memory: Decreased short-term memory Following Commands: Follows one step commands inconsistently,Follows one step commands with increased time Safety/Judgement: Decreased awareness of safety,Decreased awareness of deficits Awareness: Intellectual Problem Solving: Slow processing,Requires verbal cues,Requires tactile cues General Comments: pt able to state name and hospital today but stated [place] and was unable to state date or choose correct date when given choices, pt with noted R sided inattention and remains easily distracted, hard time sequencing washing hands at sink, forgot to turn water off, unable to find paper towels on the R despite max verbal cues Difficult to assess due to: Impaired communication (aphasia)  Physical Exam: Blood pressure (!) 145/91, pulse 85, temperature 98.4 F (36.9 C), resp. rate 15, height 5' 9.5" (1.765 m), weight 112.9 kg, SpO2 96 %. Physical Exam Constitutional:      General: He is not in acute distress.    Appearance: He is obese. He is not ill-appearing.  HENT:     Head: Normocephalic and atraumatic.     Right Ear: External ear normal.     Left Ear: External ear normal.     Nose: Nose normal.   Eyes:     General:        Right eye: No discharge.  Left eye: No discharge.     Comments: Briefly opens eyes  Cardiovascular:     Comments: Irregularly irregular Pulmonary:     Effort: Pulmonary effort is normal. No respiratory distress.     Breath sounds: No stridor.  Abdominal:     General: Abdomen is flat. Bowel sounds are normal. There is no distension.  Musculoskeletal:     Cervical back: Normal range of motion and neck supple.     Comments: No edema or tenderness in extremities  Skin:    General: Skin is warm and dry.  Neurological:     Comments: Extremely lethargic, difficult to arouse Motor: Limited, however able to move all extremities spontaneously  Psychiatric:     Comments: Limited due to lethargy     Results for orders placed or performed during the hospital encounter of 07/15/20 (from the past 48 hour(s))  Glucose, capillary     Status: Abnormal   Collection Time: 07/16/20  8:26 AM  Result Value Ref Range   Glucose-Capillary 127 (H) 70 - 99 mg/dL    Comment: Glucose reference range applies only to samples taken after fasting for at least 8 hours.   Comment 1 Notify RN    Comment 2 Document in Chart   Glucose, capillary     Status: Abnormal   Collection Time: 07/16/20 11:50 AM  Result Value Ref Range   Glucose-Capillary 150 (H) 70 - 99 mg/dL    Comment: Glucose reference range applies only to samples taken after fasting for at least 8 hours.   Comment 1 Notify RN    Comment 2 Document in Chart   Glucose, capillary     Status: Abnormal   Collection Time: 07/16/20  4:51 PM  Result Value Ref Range   Glucose-Capillary 116 (H) 70 - 99 mg/dL    Comment: Glucose reference range applies only to samples taken after fasting for at least 8 hours.   Comment 1 Notify RN    Comment 2 Document in Chart   Glucose, capillary     Status: Abnormal   Collection Time: 07/16/20  9:02 PM  Result Value Ref Range   Glucose-Capillary 150 (H) 70 - 99 mg/dL    Comment:  Glucose reference range applies only to samples taken after fasting for at least 8 hours.  Glucose, capillary     Status: Abnormal   Collection Time: 07/17/20  6:03 AM  Result Value Ref Range   Glucose-Capillary 126 (H) 70 - 99 mg/dL    Comment: Glucose reference range applies only to samples taken after fasting for at least 8 hours.  Comprehensive metabolic panel     Status: Abnormal   Collection Time: 07/17/20  6:36 AM  Result Value Ref Range   Sodium 139 135 - 145 mmol/L   Potassium 3.8 3.5 - 5.1 mmol/L   Chloride 107 98 - 111 mmol/L   CO2 27 22 - 32 mmol/L   Glucose, Bld 133 (H) 70 - 99 mg/dL    Comment: Glucose reference range applies only to samples taken after fasting for at least 8 hours.   BUN 19 8 - 23 mg/dL   Creatinine, Ser 1.47 (H) 0.61 - 1.24 mg/dL   Calcium 9.0 8.9 - 10.3 mg/dL   Total Protein 5.9 (L) 6.5 - 8.1 g/dL   Albumin 3.0 (L) 3.5 - 5.0 g/dL   AST 17 15 - 41 U/L   ALT 13 0 - 44 U/L   Alkaline Phosphatase 54 38 - 126 U/L  Total Bilirubin 2.0 (H) 0.3 - 1.2 mg/dL   GFR, Estimated 48 (L) >60 mL/min    Comment: (NOTE) Calculated using the CKD-EPI Creatinine Equation (2021)    Anion gap 5 5 - 15    Comment: Performed at [facility] [address], [place], [place] [address]  Glucose, capillary     Status: Abnormal   Collection Time: 07/17/20 12:30 PM  Result Value Ref Range   Glucose-Capillary 162 (H) 70 - 99 mg/dL    Comment: Glucose reference range applies only to samples taken after fasting for at least 8 hours.  Glucose, capillary     Status: Abnormal   Collection Time: 07/17/20  4:21 PM  Result Value Ref Range   Glucose-Capillary 126 (H) 70 - 99 mg/dL    Comment: Glucose reference range applies only to samples taken after fasting for at least 8 hours.  Glucose, capillary     Status: Abnormal   Collection Time: 07/17/20  9:56 PM  Result Value Ref Range   Glucose-Capillary 158 (H) 70 - 99 mg/dL    Comment: Glucose reference range applies only to  samples taken after fasting for at least 8 hours.  Comprehensive metabolic panel     Status: Abnormal   Collection Time: 07/18/20  3:43 AM  Result Value Ref Range   Sodium 139 135 - 145 mmol/L   Potassium 4.0 3.5 - 5.1 mmol/L   Chloride 106 98 - 111 mmol/L   CO2 25 22 - 32 mmol/L   Glucose, Bld 171 (H) 70 - 99 mg/dL    Comment: Glucose reference range applies only to samples taken after fasting for at least 8 hours.   BUN 25 (H) 8 - 23 mg/dL   Creatinine, Ser 1.81 (H) 0.61 - 1.24 mg/dL   Calcium 9.1 8.9 - 10.3 mg/dL   Total Protein 5.9 (L) 6.5 - 8.1 g/dL   Albumin 3.0 (L) 3.5 - 5.0 g/dL   AST 18 15 - 41 U/L   ALT 13 0 - 44 U/L   Alkaline Phosphatase 53 38 - 126 U/L   Total Bilirubin 1.4 (H) 0.3 - 1.2 mg/dL   GFR, Estimated 38 (L) >60 mL/min    Comment: (NOTE) Calculated using the CKD-EPI Creatinine Equation (2021)    Anion gap 8 5 - 15    Comment: Performed at [facility], [place] [address], [place], [place] [address]  CBC     Status: Abnormal   Collection Time: 07/18/20  3:43 AM  Result Value Ref Range   WBC 12.0 (H) 4.0 - 10.5 K/uL   RBC 4.85 4.22 - 5.81 MIL/uL   Hemoglobin 14.9 13.0 - 17.0 g/dL   HCT 47.0 39.0 - 52.0 %   MCV 96.9 80.0 - 100.0 fL   MCH 30.7 26.0 - 34.0 pg   MCHC 31.7 30.0 - 36.0 g/dL   RDW 12.9 11.5 - 15.5 %   Platelets 215 150 - 400 K/uL   nRBC 0.0 0.0 - 0.2 %    Comment: Performed at [facility], [place] [address], [place], [place] [address]  Magnesium     Status: None   Collection Time: 07/18/20  3:43 AM  Result Value Ref Range   Magnesium 1.9 1.7 - 2.4 mg/dL    Comment: Performed at [facility] [address] [place], [place] [address]   CT HEAD WO CONTRAST  Result Date: 07/17/2020 CLINICAL DATA:  Follow-up CVA. EXAM: CT HEAD WITHOUT CONTRAST  TECHNIQUE: Contiguous axial images were obtained from the base of the skull through the vertex without intravenous contrast. COMPARISON:  Head CT 07/15/2020 and MRI 07/13/2020. FINDINGS:  Brain: Slight evolutionary change and the large left MCA territory infarct with more clearly defined area of low attenuation. No findings suspicious for acute hemorrhage. No new mass effect. No new infarcts are identified. Stable underlying age related cerebral atrophy and ventriculomegaly. Vascular: Stable vascular calcifications but no aneurysm or hyperdense vessels. Skull: No skull fracture or bone lesions. Sinuses/Orbits: Chronic right half sphenoid sinus disease. The paranasal sinuses and mastoid air cells are otherwise clear. The globes are intact. Other: No scalp lesions or scalp hematoma. IMPRESSION: 1. Slight evolutionary change in the large left MCA territory infarct. No findings suspicious for acute hemorrhage. No new infarcts. 2. Stable underlying age related cerebral atrophy and ventriculomegaly. 3. Chronic right half sphenoid sinus disease. Electronically Signed   By: Marijo Sanes M.D.   On: 07/17/2020 13:40       Medical Problem List and Plan: 1.  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  Antithrombotics: -DVT/anticoagulation: Awaiting plan transition to Eliquis  -antiplatelet therapy: Currently on aspirin 325 mg daily awaiting transition to Eliquis for stroke prevention 3. Pain Management: Tylenol as needed 4. Mood: Ativan 1 mg daily.  Provide emotional support  -antipsychotic agents: [name] 5. Neuropsych: This patient is capable of making decisions on his own behalf. 6. Skin/Wound Care: Routine skin checks 7. Fluids/Electrolytes/Nutrition: Routine in and outs  CMP ordered for tomorrow a.m. 8.  Hypertension  Monitor increase activity 9.  Hyperlipidemia.  Crestor/Colestid 10.  New onset atrial fibrillation.  Cardiac rate controlled.  Await plan to begin Eliquis  Monitor with increased  activity 11.  Prediabetes.  Hemoglobin A1c 6.6 2018 and 7.1 on this admission.  He was on no glucose lowering agents at home.  Currently SSI.  Await plan to possibly begin metformin.  Monitor increase mobility 12.  History of colon cancer with colectomy 2018.  Follow-up outpatient 13.  Obesity.  BMI 36.24.  Dietary follow-up 14.  AKI/CKD.  Creatinine 1.81.  Follow-up chemistries  CMP ordered for tomorrow a.m.   Lavon Paganini Angiulli, PA-C 07/18/2020  I have personally performed a face to face diagnostic evaluation, including, but not limited to relevant history and physical exam findings, of this patient and developed relevant assessment and plan.  Additionally, I have reviewed and concur with the physician assistant's documentation above.  Delice Lesch, MD, ABPMR

## 2020-07-18 NOTE — Care Management Important Message (Signed)
Important Message  Patient Details  Name: Edwin Wall MRN: [id number] Date of Birth: [birth year]   Medicare Important Message Given:  Yes     [name] Montine Circle 07/18/2020, 2:35 PM

## 2020-07-18 NOTE — Discharge Summary (Signed)
Name: Edwin Wall MRN: [id number] DOB: 1940/10/17 80 y.o. PCP: [name], MD  Date of Admission: 07/15/2020  9:19 AM Date of Discharge: 07/18/2020 Attending Physician: Campbell Riches, MD  Discharge Diagnosis: 1. Acute Left MCA Infarction  2. Expressive and Receptive Aphasia  3. Mild Right-Sided Hemiplegia  4. New-Onset DM 5. HLD 6. HTN 7. Mild-Moderate Intermittent Esophageal Dysmotility 8. New Atrial Fibrillation  9. New RBBB 10. Anxiety  11. 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 Hyperbilirubinemia    Discharge Medications: Allergies as of 07/18/2020   No Known Allergies     Medication List    STOP taking these medications   benazepril 40 MG tablet Commonly known as: LOTENSIN     TAKE these medications   acetaminophen 325 MG tablet Commonly known as: TYLENOL Take 2 tablets (650 mg total) by mouth every 4 (four) hours as needed for mild pain (or temp > 37.5 C (99.5 F)).   acetaminophen 160 MG/5ML solution Commonly known as: TYLENOL Place 20.3 mLs (650 mg total) into feeding tube every 4 (four) hours as needed for mild pain (or temp > 37.5 C (99.5 F)).   acetaminophen 650 MG suppository Commonly known as: TYLENOL Place 1 suppository (650 mg total) rectally every 4 (four) hours as needed for mild pain (or temp > 37.5 C (99.5 F)).   apixaban 5 MG Tabs tablet Commonly known as: ELIQUIS Take 1 tablet (5 mg total) by mouth 2 (two) times daily. Start taking on: Jul 20, 2020   aspirin 325 MG EC tablet Take 1 tablet (325 mg total) by mouth daily. Start taking on: Jul 19, 2020   aspirin 300 MG suppository Place 1 suppository (300 mg total) rectally daily. Start taking on: Jul 19, 2020   colestipol 1 g tablet Commonly known as: COLESTID Take 1 tablet (1 g total) by mouth 2 (two) times daily.   diclofenac 75 MG EC tablet Commonly known as: VOLTAREN Take 75 mg by mouth daily as needed for mild pain.   insulin aspart 100 UNIT/ML injection Commonly  known as: novoLOG Inject 0-15 Units into the skin 3 (three) times daily with meals.   LORazepam 1 MG tablet Commonly known as: ATIVAN Take 1 tablet (1 mg total) by mouth daily. What changed:   when to take this  reasons to take this   losartan 25 MG tablet Commonly known as: Cozaar Take 1 tablet daily in the mornings.   ondansetron 4 MG tablet Commonly known as: ZOFRAN Take 1 tablet (4 mg total) by mouth every 8 (eight) hours as needed for nausea or vomiting.   rosuvastatin 40 MG tablet Commonly known as: CRESTOR Take 1 tablet (40 mg total) by mouth daily. Start taking on: Jul 19, 2020   senna-docusate 8.6-50 MG tablet Commonly known as: Senokot-S Take 1 tablet by mouth at bedtime as needed for mild constipation.       Disposition and follow-up:   Edwin Wall was discharged from [facility] in Stable condition.  At the hospital follow up visit please address:  Please follow up the following:   * Note - patient will need to follow up with PCP and Neuro following D/C.  1. Acute Left MCA Infarction w/ Aphasia and mild R hemiplegia - will need CIR with frequent neuro checks / fall precautions  2. New-Onset A-Fib and RBBB - likely caused patient's acute stroke; ECHO largely unremarkable. He is on ASA 325mg  daily which will need continued with  addition of Eliquis 5mg  BID 07/20/20. Monitor for bleeding in the setting of hx colon cancer s/p resection (2018)  3. New-Onset DM - Hgb A1c this admission noted to be 7.1% on no meds for this at home. Continue SSI (tolerated moderate well) and consider Metformin up-titration on discharge if renal fx is stable   4. HLD - Started on Rosuvastatin 40mg  daily here; please continue w/ outpatient follow up  5. HTN - BP is between 120's-150's persistently here; ordered Losartan 25mg  daily starting tomorrow if renal function improves; otherwise, recommend amlodipine 5mg  daily with goal BP <120/80  6. AKI on AKD  Stage 3 a/b - very likely pre-renal due to poor PO intake; continue to monitor strict I&O and renal function   7. Mild-Moderate Intermittent Esophageal Dysmotility - demonstrated on barium swallow today; patient is currently kept NPO as he was unable to initiate swallowing of barium pill 72mm; will need urgent repeat SLP evaluation. Please consider PPI if able to swallow.   8. Anxiety - had significant mental stress at home leading up to incident; was taking Lorazepam 1mg  daily, continued here. Consider transitioning to SSRI   9. Hyperbilirubinemia - improving, consider due to Gilbert's vs. Hemolysis; no inpatient workup required   10. Leukocytosis - WBC acutely increased today. He had vomiting concerning for aspiration but no pulmonary findings so held off on CXR, especially as he has had dysuria and lower abdominal pain. Please follow up / order UA if not already sent and order culture if concerning for infection  2.  Labs / imaging needed at time of follow-up: Urinalysis (ordered) +/- urine culture, RFP, Mg, CBC w/ diff  3.  Pending labs/ test needing follow-up: Urinalysis   Follow-up Appointments:  Follow-up Information    [facility]. Schedule an appointment as soon as possible for a visit in 4 week(s).   Specialty: Neurology Contact information: [address] [place] [facility] Course by problem list:  # Acute Left MCA Infarction # Expressive and Receptive Aphasia  # Mild Hemiplegia See additional imaging studies above. MRI depicted a 6.8 x 3.2 x 5.5cm acute cortical / subcortical infarct over the left frontal operculum, anterolateral left frontal lobe and left insula, stable on repeat CT imaging performed due to concern for limited right-ward/upward gaze, N/V and dizziness. The latter resolved today and were likely due to eating too quickly (? Loss of inhibition).  His aphasia does vary  significantly day-to-day although is improving overall. He says he is apprehensive about standing up and has not yet gotten out of bed. Likely occurred in the setting of new A-Fib (with RBBB) along with multiple uncontrolled risk factors including HTN, HLD, obesity and DM. Neurology were consulted immediately and recommend continuing ASA 325mg  daily and starting Eliquis for prevention in the setting of A-Fib 5/20-5/22/22. Will need very strict control of risk factors noted below. Discharging today to CIR for ongoing management.  # Nausea / Vomiting, Resolved  # Mild-Moderate Intermittent Esophageal Dysmotility  07/17/20 patient had an acute episode of nausea and vomiting x 1 following ingestion of a very large breakfast. His wife had never seen him eat so quickly. SLP were consulted and determined that he was not at risk for aspiration initially and he was tolerating PO intake well after this with Zofran PRN and home colestipol, although SLP later informed me of concern of acute on chronic esophageal dysphagia.  Barium swallow performed today showed a mildly patulous esophagus with mild-moderate intermittent esophageal dysmotility. He was unable to initiate swallowing of a 10mm barium tablet. He was kept NPO following procedure due to concern with swallowing pills. Please have SLP re-evaluate.   # Leukocytosis, New # Dysuria  WBC increased from 7.9 to 12.0 day of discharge. Although we were initially most concerned for aspiration due to vomiting episode above, he continued to saturate well on room air with clear lungs throughout admission and denied any cough or other symptoms. He endorsed dysuria throughout his stay with lower abdominal pain, concerning for UTI. Urinalysis was ordered and will need followed up w/ culture if concerned for infection.  Continue monitoring.  # New Atrial Fibrillation  # New RBBB Initial EKG in the ED showed new A-Fib with RBBB. He has never had this in the past per my  knowledge. TSH normal. CHADs-VASc score is 5 and HASBLED score is 3. He is not on any home anticoagulation and denies any recent bleeding despite dx of colon cancer in 2018 with recurrent polyps since. ECHO today shows EF 50-55% without regional WMA's with mild MR and mildly dilated LA which may be contributing. Otherwise unremarkable. He was given Magnesium supplementation as needed to keep Mag > 2 with goal of K 4-5 as well. Will need to be started on Eliquis 5/20-5/22 per neurology (held off initially due to large size of stroke w/ high bleeding risk). Will need ongoing monitoring for bleeding.   # HTN Blood pressures were initially up to 99991111 systolic although improved discharge day down to 120-130's. He was prescribed benzapril 40mg  daily recently by PCP. This was held to allow permissive HTN with goal per neurology of normalizing BP in 3-5 days after acute stroke. Please start losartan 25mg  daily (for renal protection) tomorrow if renal function improves or else consider amlodipine 5mg  daily.   # New-Onset DM  Very likely T2DM. Hgb A1c this admission increased from 6.6 in 2018 to 7.1% this admission. He is not on any glucose-lowering agents at home. Sugars are slightly elevated although <180 consistently on moderate SSI. Would benefit from starting Metformin at discharge.   # Anxiety Patient and wife have been under a lot of increased stress due to adult son living at home with OCD and mental health troubles. Patient takes Lorazepam 1mg  once daily at home for anxiety that has been exacerbated recently. May benefit from SSRI in future.  # Hyperbilirubinemia Bilirubin was up to 2.0 without direct bili elevation. No upper abdominal pain to suspect hepatobiliary involvement. Considered due to Gilbert's syndrome vs. Acute hemolysis in setting of stroke. Improved at discharge.  # AKI on CKD Stage 3, Unspecified whether a or b Creatinine did trend up from 1.47 to 1.81 with GFR down to 38. I  highly suspect this is pre-renal due to decreased PO intake due to stroke as well as NPO status currently and vomiting yesterday. Will need ongoing monitoring.   Please see progress note from today for Discharge day Subjective and Physical Exam.   Pertinent Labs, Studies, and Procedures:   CT HEAD WITHOUT CONTRAST TECHNIQUE: Contiguous axial images were obtained from the base of the skull through the vertex without intravenous contrast. COMPARISON:  None. FINDINGS: Brain: Cytotoxic edema in the anterior insula, anterior inferior frontal lobe, and high lateral frontal lobe. Borderline involvement of the putamen. No hemorrhage, hydrocephalus, or pre-existing infarct. Cerebral volume loss without specific pattern. Vascular: Dense left M2 branch at the level of infarct.  Skull: Negative Sinuses/Orbits: Negative Other: These results were called by telephone at the time of interpretation on 07/15/2020 at 9:36 am to Dr Sal , who verbally acknowledged these results. ASPECTS [facility] Stroke Program Early CT Score) - Ganglionic level infarction (caudate, lentiform nuclei, internal capsule, insula, M1-M3 cortex): 4-5 - Supraganglionic infarction (M4-M6 cortex): 2 Total score (0-10 with 10 being normal): 6-7  IMPRESSION: 1. 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 ASPECTS is 6-7. 2. No acute hemorrhage.  Electronically Signed   By: Monte Fantasia M.D.   On: 07/15/2020 09:40  MRI HEAD WITHOUT CONTRAST TECHNIQUE: Multiplanar, multiecho pulse sequences of the brain and surrounding structures were obtained without intravenous contrast. COMPARISON:  Noncontrast head CT, CT angiogram head/neck and CT perfusion performed earlier today 07/15/2020. FINDINGS: Brain: Mild cerebral and cerebellar atrophy. Acute cortical/subcortical infarct affecting the left frontal operculum, anterolateral left frontal lobe and left insula. The infarct measures up to 6.8 x 3.2 x 5.5  cm in greatest dimensions. No evidence of hemorrhagic conversion. No significant mass effect at this time. Background mild multifocal T2/FLAIR hyperintensity within the cerebral white matter, nonspecific but compatible with chronic small vessel ischemic disease. No evidence of intracranial mass. No chronic intracranial blood products. No extra-axial fluid collection. No midline shift. Vascular: T2* signal loss at site of a known left M2 MCA occlusion. Skull and upper cervical spine: No focal marrow lesion. Sinuses/Orbits: Visualized orbits show no acute finding. Trace scattered paranasal sinus mucosal thickening. Small right maxillary sinus mucous retention cyst. Other: Bilateral mastoid effusions  IMPRESSION: 6.8 x 3.2 x 5.5 cm acute cortical/subcortical infarct affecting the left frontal operculum, anterolateral left frontal lobe and left insula (MCA vascular territory). No evidence of hemorrhagic conversion. No significant mass effect at this time. Background mild generalized parenchymal atrophy and cerebral white matter chronic small vessel ischemic disease. Bilateral mastoid effusions.  Electronically Signed   By: Kellie Simmering DO   On: 07/15/2020 15:18  CT HEAD WITHOUT CONTRAST  TECHNIQUE: Contiguous axial images were obtained from the base of the skull through the vertex without intravenous contrast.  COMPARISON:  Head CT 07/15/2020 and MRI 07/13/2020. FINDINGS: Brain: Slight evolutionary change and the large left MCA territory infarct with more clearly defined area of low attenuation. No findings suspicious for acute hemorrhage. No new mass effect. No new infarcts are identified. Stable underlying age related cerebral atrophy and ventriculomegaly. Vascular: Stable vascular calcifications but no aneurysm or hyperdense vessels. Sinuses/Orbits: Chronic right half sphenoid sinus disease. The paranasal sinuses and mastoid air cells are otherwise clear. The globes  are intact. Other: No scalp lesions or scalp hematoma.  IMPRESSION: 1. Slight evolutionary change in the large left MCA territory infarct. No findings suspicious for acute hemorrhage. No new infarcts. 2. Stable underlying age related cerebral atrophy and ventriculomegaly. 3. Chronic right half sphenoid sinus disease.  Electronically Signed   By: Marijo Sanes M.D.   On: 07/17/2020 13:40  ESOPHOGRAM / BARIUM SWALLOW / BARIUM TABLET STUDY TECHNIQUE: A single contrast examination was performed using thick and thin barium liquid. The patient attempted to swallow a 13 mm barium sulphate tablet. FLUOROSCOPY TIME:  Fluoroscopy Time: 2 minutes, 24 seconds (33.5 mGy). Radiation Exposure Index (if provided by the fluoroscopic device): 33.50 mGy Number of Acquired Spot Images: 1 COMPARISON:  CT abdomen/pelvis 11/13/2016. FINDINGS: Somewhat limited evaluation due to limited patient mobility. Fluoroscopic evaluation demonstrates a mildly patulous esophagus. No evidence of fixed stricture, mass or mucosal abnormality. Mild-to-moderate intermittent esophageal dysmotility with tertiary  contractions. No hiatal hernia identified. No gastroesophageal reflux observed. The patient was unable to initiate swallowing of a 13 mm barium tablet.  IMPRESSION: Somewhat limited evaluation due to limited patient mobility. Mildly patulous esophagus. 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 The patient was unable to initiate swallowing of a 13 mm barium tablet. Otherwise unremarkable esophagram, as described.  Electronically Signed   By: Kellie Simmering DO   On: 07/18/2020 13:30   Discharge Instructions: Discharge Instructions    Ambulatory referral to Neurology   Complete by: As directed    Follow up with stroke clinic NP (Jessica Vanschaick or Cecille Rubin, if both not available, consider Leonie Man, Penumali, or Ahern) at [facility] in about 4 weeks. Thanks.   Call MD for:  difficulty  breathing, headache or visual disturbances   Complete by: As directed    Call MD for:  extreme fatigue   Complete by: As directed    Call MD for:  persistant dizziness or light-headedness   Complete by: As directed    Call MD for:  persistant nausea and vomiting   Complete by: As directed    Call MD for:  severe uncontrolled pain   Complete by: As directed    Call MD for:  temperature >100.4   Complete by: As directed    Discharge instructions   Complete by: As directed    Edwin Wall, Edwin Wall were admitted to the hospital due to troubles speaking, facial droop, and right-sided arm and leg weakness, found to have a large, ischemic stroke. Neurology were consulted and you were started on Aspirin and high dose Rosuvastatin, which you will need to take indefinitely. You were also found to have new atrial fibrillation with a RBBB although your heart ultrasound was largely unremarkable otherwise. It is very likely this Atrial fibrillation as well as other risk factors including high blood pressure, high cholesterol, (new) diabetes, and obesity contributed to your stroke.   It will be very important that you follow up with your primary care doctor as well as neurology following your discharge from the hospital, continue to work with therapy, and keep good control of all of your risk factors noted above (through diet, exercise, and your new medications).   Please return to the ED if you experience any new vision changes, numbness, weakness, chest pain, persistent fevers, chills, or for any other concerns.   Thank you and take care,  Dr. Konrad Penta   Increase activity slowly   Complete by: As directed       Signed: Jeralyn Bennett, MD 07/18/2020, 2:46 PM   Pager: [phone]

## 2020-07-18 NOTE — Progress Notes (Signed)
Physical Therapy Treatment Patient Details Name: Edwin Wall MRN: [id number] DOB: 31-Mar-1940 Today's Date: 07/18/2020    History of Present Illness 80 yo male presents to the ED on 5/25 with speech difficutlies and facial droop. 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 PMH including anxiety, arthritis, colon cancer (dx 2018), HTN, obesity, and pre-daibetes.    PT Comments    Patient progressing towards physical therapy goals. Patient continues to overall require minA for mobility with no AD. Patient performed seated and standing exercises focusing on B LE strengthening. Difficult to assess cognition due to impaired communication (aphasia) but patient is easily distracted and requires increased time and multimodal cueing for sequencing of tasks. Continue to recommend comprehensive inpatient rehab (CIR) for post-acute therapy needs.     Follow Up Recommendations  CIR     Equipment Recommendations  None recommended by PT    Recommendations for Other Services       Precautions / Restrictions Precautions Precautions: Fall Restrictions Weight Bearing Restrictions: No    Mobility  Bed Mobility Overal bed mobility: Needs Assistance Bed Mobility: Supine to Sit     Supine to sit: Min assist     General bed mobility comments: minA for trunk elevation, multimodal cueing for sequencing    Transfers Overall transfer level: Needs assistance Equipment used: None Transfers: Sit to/from Stand Sit to Stand: Min assist         General transfer comment: minA to power and for safety as pt impulsive  Ambulation/Gait Ambulation/Gait assistance: Min assist Gait Distance (Feet): 25 Feet Assistive device: None Gait Pattern/deviations: Step-through pattern;Drifts right/left Gait velocity: decr   General Gait Details: patient reaching for objects to grasp with ambulation. no LOB  noted   Stairs             Wheelchair Mobility    Modified Rankin (Stroke Patients Only) Modified Rankin (Stroke Patients Only) Pre-Morbid Rankin Score: No symptoms Modified Rankin: Moderately severe disability     Balance Overall balance assessment: Needs assistance Sitting-balance support: No upper extremity supported;Feet supported Sitting balance-Leahy Scale: Good     Standing balance support: No upper extremity supported;During functional activity Standing balance-Leahy Scale: Fair Standing balance comment: cannot accept challenge                            Cognition Arousal/Alertness: Awake/alert Behavior During Therapy: WFL for tasks assessed/performed Overall Cognitive Status: Difficult to assess Area of Impairment: Attention;Awareness;Problem solving;Following commands;Safety/judgement;Memory;Orientation                 Orientation Level: Disoriented to;Place;Situation Current Attention Level: Sustained;Selective Memory: Decreased short-term memory Following Commands: Follows one step commands inconsistently;Follows one step commands with increased time Safety/Judgement: Decreased awareness of safety;Decreased awareness of deficits Awareness: Intellectual Problem Solving: Slow processing;Requires verbal cues;Requires tactile cues General Comments: unable to identify place or situation. Requires increased time and cueing for sequencing. Easily distracted      Exercises General Exercises - Lower Extremity Long Arc Quad: Both;10 reps;Seated Hip Flexion/Marching: Both;10 reps;Standing Other Exercises Other Exercises: Sit to stand x 10    General Comments        Pertinent Vitals/Pain Pain Assessment: Faces Faces Pain Scale: No hurt Pain Intervention(s): Monitored during session    Home Living                      Prior Function  PT Goals (current goals can now be found in the care plan section) Acute Rehab  PT Goals Patient Stated Goal: Go home safely PT Goal Formulation: With patient/family Time For Goal Achievement: 07/30/20 Potential to Achieve Goals: Good Progress towards PT goals: Progressing toward goals    Frequency    Min 4X/week      PT Plan Current plan remains appropriate    Co-evaluation              AM-PAC PT "6 Clicks" Mobility   Outcome Measure  Help needed turning from your back to your side while in a flat bed without using bedrails?: A Little Help needed moving from lying on your back to sitting on the side of a flat bed without using bedrails?: A Little Help needed moving to and from a bed to a chair (including a wheelchair)?: A Little Help needed standing up from a chair using your arms (e.g., wheelchair or bedside chair)?: A Little Help needed to walk in hospital room?: A Little Help needed climbing 3-5 steps with a railing? : A Lot 6 Click Score: 17    End of Session Equipment Utilized During Treatment: Gait belt Activity Tolerance: Patient tolerated treatment well Patient left: in chair;with call bell/phone within reach;with chair alarm set Nurse Communication: Mobility status PT Visit Diagnosis: Other abnormalities of gait and mobility (R26.89);Other symptoms and signs involving the nervous system (F35.456)     Time: 2563-8937 PT Time Calculation (min) (ACUTE ONLY): 24 min  Charges:  $Therapeutic Exercise: 8-22 mins $Therapeutic Activity: 8-22 mins                     Mylene Bow A. Gilford Rile PT, DPT Acute Rehabilitation Services Pager [phone] Office [phone]    Linna Hoff 07/18/2020, 2:54 PM

## 2020-07-18 NOTE — Evaluation (Signed)
Clinical/Bedside Swallow Evaluation Patient Details  Name: Edwin Wall MRN: [id number] Date of Birth: 1941-02-18  Today's Date: 07/18/2020 Time: SLP Start Time (ACUTE ONLY): 1450 SLP Stop Time (ACUTE ONLY): 1505 SLP Time Calculation (min) (ACUTE ONLY): 15 min  Past Medical History:  Past Medical History:  Diagnosis Date  . Anxiety   . Arthritis   . Cataract   . Colon cancer ([place]) dx'd 11/2016   "stage 3"  . Gout   . Gout   . Hemorrhoids   . History of kidney stones    "passed it"  . Hypertension   . Pre-diabetes    Past Surgical History:  Past Surgical History:  Procedure Laterality Date  . COLECTOMY  12/17/2016   lap; partial sigmoid colectomy/notes 12/17/2016  . COLONOSCOPY W/ BIOPSIES AND POLYPECTOMY  11/11/2016  . LAPAROSCOPIC SIGMOID COLECTOMY [name] 12/17/2016   Procedure: LAPAROSCOPIC SIGMOID COLECTOMY;  Surgeon: [name], MD;  Location: [facility];  Service: General;  Laterality: [name];  . NASAL SEPTUM SURGERY    . TONSILLECTOMY     HPI:  Mr. Prokop is a 80 yo male arriving with acute onset of difficulty with word finding, dysarthria and facial droop after patient ate breakfast. 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 Pt was initially evaluated for swallowing this admission with recommendation for regular solids/thin liquids. SLP reordered after an episode of vomiting. Esophagram 5/18 revealed mildly patulous esophagus, mild to moderate dysmotility, and inability to initiate swallowing of a 13 mm tablet. PMHx of dysphagia (no hx of this in chart), "pre" DM II, HTN, OA, anxiety, and colorectal cancer s/p laparoscopic sigmoid colectomy in 2018.   Assessment / Plan / Recommendation Clinical Impression  Pt's oropharyngeal swallowing appears to be functional, with oral phase seemingly appropriate without overt focal deficits. No oral residue is noted. He has  no overt signs of aspiration even when allowed to self-regulate intake. Given results of esophagram will initially start with Dys 3 solids, thin liquids. May still wish to f/u at least briefly for swallowing while on CIR for tolerance and/or advancement as indicated. SLP Visit Diagnosis: Dysphagia, unspecified (R13.10)    Aspiration Risk  Mild aspiration risk    Diet Recommendation Dysphagia 3 (Mech soft);Thin liquid   Liquid Administration via: Cup;Straw Medication Administration: Whole meds with puree (can crush meds PRN) Supervision: Patient able to self feed;Intermittent supervision to cue for compensatory strategies Compensations: Slow rate;Small sips/bites Postural Changes: Seated upright at 90 degrees;Remain upright for at least 30 minutes after po intake    Other  Recommendations Oral Care Recommendations: Oral care BID   Follow up Recommendations Inpatient Rehab      Frequency and Duration            Prognosis Prognosis for Safe Diet Advancement: Good Barriers to Reach Goals: Time post onset      Swallow Study   General HPI: Mr. Ditter is a 80 yo male arriving with acute onset of difficulty with word finding, dysarthria and facial droop after patient ate breakfast. 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 Pt was initially evaluated for swallowing this admission with recommendation for regular solids/thin liquids. SLP reordered after an episode of vomiting. Esophagram 5/18 revealed mildly patulous esophagus, mild to moderate dysmotility, and inability to initiate swallowing of a 13 mm tablet. PMHx of dysphagia (  no hx of this in chart), "pre" DM II, HTN, OA, anxiety, and colorectal cancer s/p laparoscopic sigmoid colectomy in 2018. Type of Study: Bedside Swallow Evaluation Previous Swallow Assessment: see HPI Diet Prior to this Study: NPO Temperature Spikes Noted:  No Respiratory Status: Room air History of Recent Intubation: No Behavior/Cognition: Alert;Cooperative;Pleasant mood Oral Cavity Assessment: Within Functional Limits Oral Care Completed by SLP: No Oral Cavity - Dentition: Adequate natural dentition Vision: Functional for self-feeding Self-Feeding Abilities: Able to feed self Patient Positioning: Upright in bed Baseline Vocal Quality: Normal    Oral/Motor/Sensory Function Overall Oral Motor/Sensory Function: Within functional limits (not following all commands but [facility] as can be observed)   Ice Chips Ice chips: Not tested   Thin Liquid Thin Liquid: Within functional limits Presentation: Self Fed;Cup;Straw    Nectar Thick Nectar Thick Liquid: Not tested   Honey Thick Honey Thick Liquid: Not tested   Puree Puree: Within functional limits Presentation: Spoon   Solid     Solid: Within functional limits      Osie Bond., M.A. [facility] Pager [phone] Office [phone]  07/18/2020,3:38 PM

## 2020-07-18 NOTE — Progress Notes (Signed)
Received patient in wheelchair. Patient is AOX1, denies pain, VSS, respiration even and unlabored. 2 person RN assessment done. Patient has some ecchymosis on back and scrotum. Patient has an abrasion on the r.knee.

## 2020-07-19 DIAGNOSIS — I63512 Cerebral infarction due to unspecified occlusion or stenosis of left middle cerebral artery: Secondary | ICD-10-CM

## 2020-07-19 DIAGNOSIS — R4701 Aphasia: Secondary | ICD-10-CM | POA: Diagnosis present

## 2020-07-19 DIAGNOSIS — G8191 Hemiplegia, unspecified affecting right dominant side: Secondary | ICD-10-CM

## 2020-07-19 LAB — COMPREHENSIVE METABOLIC PANEL
ALT: 15 U/L (ref 0–44)
AST: 14 U/L — ABNORMAL LOW (ref 15–41)
Albumin: 3 g/dL — ABNORMAL LOW (ref 3.5–5.0)
Alkaline Phosphatase: 53 U/L (ref 38–126)
Anion gap: 9 (ref 5–15)
BUN: 24 mg/dL — ABNORMAL HIGH (ref 8–23)
CO2: 25 mmol/L (ref 22–32)
Calcium: 9.2 mg/dL (ref 8.9–10.3)
Chloride: 105 mmol/L (ref 98–111)
Creatinine, Ser: 1.68 mg/dL — ABNORMAL HIGH (ref 0.61–1.24)
GFR, Estimated: 41 mL/min — ABNORMAL LOW (ref >60)
Glucose, Bld: 133 mg/dL — ABNORMAL HIGH (ref 70–99)
Potassium: 3.9 mmol/L (ref 3.5–5.1)
Sodium: 139 mmol/L (ref 135–145)
Total Bilirubin: 1.3 mg/dL — ABNORMAL HIGH (ref 0.3–1.2)
Total Protein: 5.8 g/dL — ABNORMAL LOW (ref 6.5–8.1)

## 2020-07-19 LAB — CBC WITH DIFFERENTIAL/PLATELET
Abs Immature Granulocytes: 0.04 10*3/uL (ref 0.00–0.07)
Basophils Absolute: 0.1 10*3/uL (ref 0.0–0.1)
Basophils Relative: 1 %
Eosinophils Absolute: 0.4 10*3/uL (ref 0.0–0.5)
Eosinophils Relative: 4 %
HCT: 44.1 % (ref 39.0–52.0)
Hemoglobin: 14.2 g/dL (ref 13.0–17.0)
Immature Granulocytes: 0 %
Lymphocytes Relative: 27 %
Lymphs Abs: 2.6 10*3/uL (ref 0.7–4.0)
MCH: 30.9 pg (ref 26.0–34.0)
MCHC: 32.2 g/dL (ref 30.0–36.0)
MCV: 95.9 fL (ref 80.0–100.0)
Monocytes Absolute: 0.9 10*3/uL (ref 0.1–1.0)
Monocytes Relative: 9 %
Neutro Abs: 5.9 10*3/uL (ref 1.7–7.7)
Neutrophils Relative %: 59 %
Platelets: 190 10*3/uL (ref 150–400)
RBC: 4.6 MIL/uL (ref 4.22–5.81)
RDW: 12.6 % (ref 11.5–15.5)
WBC: 9.9 10*3/uL (ref 4.0–10.5)
nRBC: 0 % (ref 0.0–0.2)

## 2020-07-19 LAB — GLUCOSE, CAPILLARY
Glucose-Capillary: 127 mg/dL — ABNORMAL HIGH (ref 70–99)
Glucose-Capillary: 167 mg/dL — ABNORMAL HIGH (ref 70–99)
Glucose-Capillary: 108 mg/dL — ABNORMAL HIGH (ref 70–99)
Glucose-Capillary: 191 mg/dL — ABNORMAL HIGH (ref 70–99)

## 2020-07-19 MED ORDER — DAPAGLIFLOZIN PROPANEDIOL 5 MG PO TABS
5.0000 mg | ORAL_TABLET | Freq: Every day | ORAL | Status: DC
  Administered 2020-07-19 – 2020-07-24 (×6): 5 mg via ORAL
  Filled 2020-07-19 (×6): qty 1

## 2020-07-19 MED ORDER — ASPIRIN 325 MG PO TABS
325.0000 mg | ORAL_TABLET | Freq: Every day | ORAL | Status: AC
  Administered 2020-07-19: 325 mg via ORAL
  Filled 2020-07-19: qty 1

## 2020-07-19 MED ORDER — APIXABAN 5 MG PO TABS
5.0000 mg | ORAL_TABLET | Freq: Two times a day (BID) | ORAL | Status: DC
  Administered 2020-07-20 – 2020-08-03 (×29): 5 mg via ORAL
  Filled 2020-07-19 (×30): qty 1

## 2020-07-19 NOTE — Progress Notes (Signed)
Physical Medicine and Rehabilitation Consult Reason for Consult: Right side weakness aphasia with facial droop Referring Physician: Dr. Johnnye Sima     HPI: Edwin Wall is a 80 y.o. right-handed male with history of diabetes mellitus, hypertension, colon cancer status post colectomy 2018, obesity with BMI 36.24, anxiety.  Per chart review lives with spouse.  Independent prior to admission.  1 level home with 3 steps to entry.  Presented 07/15/2020 with acute onset of right side weakness speech difficulties and facial droop.  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  No acute hemorrhage.  Patient did not receive tPA.  CT angiogram of head and neck showed left M2 occlusion.  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  No evidence of hemorrhagic conversion no significant mass-effect.   Admission chemistries unremarkable aside glucose 155, creatinine 1.52, SARS coronavirus negative, hemoglobin A1c 7.1.  Hospital course new onset atrial fibrillation.  Echocardiogram pending.  Neurology follow-up currently on aspirin 325 mg daily considering plan for Eliquis 5 to 7 days post stroke for stroke prevention.  Tolerating a regular consistency diet.  Therapy evaluations completed due to patient's right side weakness and decreased functional mobility recommendations of physical medicine rehab consult.   Pt reports sleepy- had another CT and meds to sedate him.  No control of B/B per wife since stroke.  On regular/thin diet right now.  LBM in last 24 hours; using male purewick for voiding.      Review of Systems  Constitutional: Negative for chills and fever.  HENT: Negative for hearing loss.   Eyes: Negative for blurred vision and double vision.  Respiratory: Negative for cough and shortness of breath.    Cardiovascular: Positive for palpitations. Negative for chest pain and leg swelling.  Gastrointestinal: Positive for constipation. Negative for heartburn and nausea.  Genitourinary: Negative for dysuria, flank pain and hematuria.  Musculoskeletal: Positive for joint pain and myalgias.  Skin: Negative for rash.  Neurological: Positive for speech change and weakness.  Psychiatric/Behavioral:       Anxiety  All other systems reviewed and are negative.       Past Medical History:  Diagnosis Date  . Anxiety    . Arthritis    . Cataract    . Colon cancer ([place]) dx'd 11/2016    "stage 3"  . Gout    . Gout    . Hemorrhoids    . History of kidney stones      "passed it"  . Hypertension    . Pre-diabetes           Past Surgical History:  Procedure Laterality Date  . COLECTOMY   12/17/2016    lap; partial sigmoid colectomy/notes 12/17/2016  . COLONOSCOPY W/ BIOPSIES AND POLYPECTOMY   11/11/2016  . LAPAROSCOPIC SIGMOID COLECTOMY [name] 12/17/2016    Procedure: LAPAROSCOPIC SIGMOID COLECTOMY;  Surgeon: [name], MD;  Location: [facility];  Service: General;  Laterality: [name];  . NASAL SEPTUM SURGERY      . TONSILLECTOMY             Family History  Problem Relation Age of Onset  . Cancer Mother          LUNG  . COPD Father      Social History:  reports that he has never  smoked. He has never used smokeless tobacco. He reports that he does not drink alcohol and does not use drugs. Allergies: No Known Allergies       Medications Prior to Admission  Medication Sig Dispense Refill  . benazepril (LOTENSIN) 40 MG tablet Take 40 mg by mouth daily.      . diclofenac (VOLTAREN) 75 MG EC tablet Take 75 mg by mouth daily as needed for mild pain.      Marland Kitchen LORazepam (ATIVAN) 1 MG tablet Take 1 mg by mouth daily as needed for anxiety.          Home: Home Living Family/patient expects to be discharged to:: Private residence Living Arrangements: Spouse/significant other,Children Available Help  at Discharge: Family,Available 24 hours/day Type of Home: House Home Access: Stairs to enter [employer] of Steps: 3-4 Entrance Stairs-Rails: Right,Left,Can reach both Home Layout: One level Bathroom Shower/Tub: Ambulance person: Handicapped height Home Equipment: Careers adviser History: Prior Function Level of Independence: Independent Comments: ADLs, IADLs, and driving Functional Status:  Mobility: Bed Mobility Overal bed mobility: Needs Assistance Bed Mobility: Supine to Sit Supine to sit: Min guard General bed mobility comments: Min Guard A for safety Transfers Overall transfer level: Needs assistance Equipment used: None Transfers: Sit to/from Stand Sit to Stand: Min assist General transfer comment: Min Guard-Min A for balance in standing pending transer surface. Pt initially standing from EOB impulsively despite cues to wait multiple times Ambulation/Gait Ambulation/Gait assistance: Min assist Gait Distance (Feet): 100 Feet (+50) Assistive device: None Gait Pattern/deviations: Step-through pattern,Drifts right/left General Gait Details: min assist to steady, x1 LOB when pt encountered unexpected object in hallway requiring PT assist to correct. Standing break to cue pt to navigate back to room, unable to do so without significant cuing. Pt incontinent of stool and urine in hallway. Gait velocity: decr   ADL: ADL Overall ADL's : Needs assistance/impaired Eating/Feeding: Set up,Sitting Grooming: Brushing hair,Oral care,Min guard,Standing Upper Body Bathing: Minimal assistance,Sitting Lower Body Bathing: Minimal assistance,Sit to/from stand Upper Body Dressing : Minimal assistance,Sitting Lower Body Dressing: Minimal assistance,Sit to/from stand Lower Body Dressing Details (indicate cue type and reason): don socks Toilet Transfer: Minimal assistance,Regular Toilet,Ambulation Toileting- Clothing Manipulation and Hygiene:  Moderate assistance,Sit to/from stand Toileting - Clothing Manipulation Details (indicate cue type and reason): Pt unaware that while performing peri care he was caught on his gown; unsuccessful at posterior peri care Functional mobility during ADLs: Minimal assistance General ADL Comments: Pt with decreased cognition, balance, and safety   Cognition: Cognition Overall Cognitive Status: Difficult to assess Arousal/Alertness: Awake/alert Orientation Level: Oriented to person,Oriented to place Attention: Sustained,Alternating Sustained Attention: Appears intact Alternating Attention: Appears intact Memory:  (UTA) Awareness: Impaired Awareness Impairment: Emergent impairment Problem Solving: Appears intact Safety/Judgment: Appears intact Cognition Arousal/Alertness: Awake/alert Behavior During Therapy: WFL for tasks assessed/performed Overall Cognitive Status: Difficult to assess Area of Impairment: Attention,Awareness,Problem solving,Following commands,Safety/judgement,Memory,Orientation Orientation Level: Disoriented to,Place,Situation Current Attention Level: Sustained,Selective Memory: Decreased short-term memory Following Commands: Follows one step commands inconsistently,Follows one step commands with increased time Safety/Judgement: Decreased awareness of safety,Decreased awareness of deficits Awareness: Intellectual Problem Solving: Slow processing,Requires verbal cues,Requires tactile cues General Comments: Pt with decreased awareness of safety and slightly impulsive. Pt requiring increased time and cues throughout. Pt unaware of bowel incontenience. Distracted by external environment. Pt responds "yes" inappropriately to several questions, including "are you in [employer]", "are you at a church" Difficult to assess due to: Impaired communication (aphasia)   Blood pressure (!) 144/99,  pulse (!) 58, temperature 97.9 F (36.6 C), temperature source Oral, resp. rate 16, height  5' 9.5" (1.765 m), weight 112.9 kg, SpO2 100 %. Physical Exam Vitals and nursing note reviewed. Exam conducted with a chaperone present.  Constitutional:      Appearance: He is obese.     Comments: Appears very sleepy; supine in bed; kept eyes closed; but speaking some; wife at bedside, elderly male; NAD  HENT:     Head: Normocephalic and atraumatic.     Comments: Mild R facial droop- not able to stick tongue out?    Right Ear: External ear normal.     Left Ear: External ear normal.     Nose: Nose normal. No congestion.     Mouth/Throat:     Mouth: Mucous membranes are moist.     Pharynx: Oropharynx is clear. No oropharyngeal exudate.  Eyes:     General:        Right eye: No discharge.        Left eye: No discharge.     Comments: Kept eyes closed- won't open;   Cardiovascular:     Rate and Rhythm: Normal rate. Rhythm irregular.     Heart sounds: Normal heart sounds. No murmur heard. No gallop.   Pulmonary:     Comments: CTA B/L- no W/R/R- good air movement Abdominal:     Comments: Soft, NT, ND, (+)BS    Genitourinary:    Comments: Male purewick in place- medium amber urine in container Musculoskeletal:     Cervical back: Normal range of motion and neck supple.     Comments: LUE/LLE- 5/5 but somewhat apraxic RUE- 4/5 in biceps, triceps, WE, grip and finger abd RLE- HF 2/5, KE 4+/5, DF 4-/5, and PF 4-/5  Skin:    Comments: No skin breakdown seen- mild bruising on arms  Neurological:     Comments: Patient is alert.  Makes eye contact with examiner.  He does exhibit receptive expressive aphasia.  Inconsistent to follow simple commands.  He was able to state his wife's name and repeat some simple 3 word phrases. Pt was able to repeat- No, if's or buts"- slightly slow, but repeated correctly However spontaneous speech impaired greatly- mod to severe aphasia/expressive and possibly receptive- thought was "[place]" [place]", said doesn't know year or date/day/month- notable  perseveration. Interfered in exam  Psychiatric:     Comments: Lethargic, sleepy, slowed responses        Lab Results Last 24 Hours       Results for orders placed or performed during the hospital encounter of 07/15/20 (from the past 24 hour(s))  Glucose, capillary     Status: Abnormal    Collection Time: 07/16/20  8:26 AM  Result Value Ref Range    Glucose-Capillary 127 (H) 70 - 99 mg/dL    Comment 1 Notify RN      Comment 2 Document in Chart    Glucose, capillary     Status: Abnormal    Collection Time: 07/16/20 11:50 AM  Result Value Ref Range    Glucose-Capillary 150 (H) 70 - 99 mg/dL    Comment 1 Notify RN      Comment 2 Document in Chart    Glucose, capillary     Status: Abnormal    Collection Time: 07/16/20  4:51 PM  Result Value Ref Range    Glucose-Capillary 116 (H) 70 - 99 mg/dL    Comment 1 Notify RN      Comment  2 Document in Chart    Glucose, capillary     Status: Abnormal    Collection Time: 07/16/20  9:02 PM  Result Value Ref Range    Glucose-Capillary 150 (H) 70 - 99 mg/dL       Imaging Results (Last 48 hours)  MR BRAIN WO CONTRAST   Result Date: 07/15/2020 CLINICAL DATA:  Neuro deficit, acute, stroke suspected. EXAM: MRI HEAD WITHOUT CONTRAST TECHNIQUE: Multiplanar, multiecho pulse sequences of the brain and surrounding structures were obtained without intravenous contrast. COMPARISON:  Noncontrast head CT, CT angiogram head/neck and CT perfusion performed earlier today 07/15/2020. FINDINGS: Brain: Mild cerebral and cerebellar atrophy. Acute cortical/subcortical infarct affecting the left frontal operculum, anterolateral left frontal lobe and left insula. The infarct measures up to 6.8 x 3.2 x 5.5 cm in greatest dimensions. No evidence of hemorrhagic conversion. No significant mass effect at this time. Background mild multifocal T2/FLAIR hyperintensity within the cerebral white matter, nonspecific but compatible with chronic small vessel ischemic disease. No  evidence of intracranial mass. No chronic intracranial blood products. No extra-axial fluid collection. No midline shift. Vascular: T2* signal loss at site of a known left M2 MCA occlusion. Skull and upper cervical spine: No focal marrow lesion. Sinuses/Orbits: Visualized orbits show no acute finding. Trace scattered paranasal sinus mucosal thickening. Small right maxillary sinus mucous retention cyst. Other: Bilateral mastoid effusions IMPRESSION: 6.8 x 3.2 x 5.5 cm acute cortical/subcortical infarct affecting the left frontal operculum, anterolateral left frontal lobe and left insula (MCA vascular territory). No evidence of hemorrhagic conversion. No significant mass effect at this time. Background mild generalized parenchymal atrophy and cerebral white matter chronic small vessel ischemic disease. Bilateral mastoid effusions. Electronically Signed   By: Kellie Simmering DO   On: 07/15/2020 15:18    CT CEREBRAL PERFUSION W CONTRAST   Result Date: 07/15/2020 CLINICAL DATA:  Code stroke with abnormal head CT EXAM: CT ANGIOGRAPHY HEAD AND NECK CT PERFUSION BRAIN TECHNIQUE: Multidetector CT imaging of the head and neck was performed using the standard protocol during bolus administration of intravenous contrast. Multiplanar CT image reconstructions and MIPs were obtained to evaluate the vascular anatomy. Carotid stenosis measurements (when applicable) are obtained utilizing NASCET criteria, using the distal internal carotid diameter as the denominator. Multiphase CT imaging of the brain was performed following IV bolus contrast injection. Subsequent parametric perfusion maps were calculated using RAPID software. CONTRAST:  138mL OMNIPAQUE IOHEXOL 350 MG/ML SOLN COMPARISON:  Noncontrast CT from earlier today FINDINGS: CTA NECK FINDINGS Aortic arch: 2 vessel arch.  Mild atheromatous plaque Right carotid system: Low-density plaque at the bifurcation without common carotid or ICA stenosis. No ulceration or dissection.  Left carotid system: Low-density plaque at the bifurcation without stenosis or ulceration. Vertebral arteries: Subclavian atherosclerosis. Codominant vertebral arteries that are smoothly contoured and widely patent to the dura. Skeleton: No acute finding. Severe cervical spine degeneration with ridging causing foraminal an cord impingement. Other neck: Negative Upper chest: Negative Review of the MIP images confirms the above findings CTA HEAD FINDINGS Anterior circulation: Mild atheromatous calcification at the siphons. Left M2 branch occlusion correlating with a hyperdensity by CT. No additional branch occlusion is seen. Negative for aneurysm. Posterior circulation: The vertebral and basilar arteries are smooth and widely patent. No branch occlusion, beading, or aneurysm. Venous sinuses: Negative Anatomic variants: Negative Review of the MIP images confirms the above findings CT Brain Perfusion Findings: ASPECTS: 6-7 CBF (<30%) Volume: 51mL Perfusion (Tmax>6.0s) volume: 4mL Mismatch Volume: 64mL Infarction Location:Anterior division left MCA  territory Case discussed with Dr. Milas Gain while in progress. IMPRESSION: 1. Left M2 occlusion. CT perfusion reports 26 cc of core infarct with 17 cc of penumbra, but based on noncontrast head CT the penumbra of may be overestimated. 2. Overall mild atherosclerosis for age. No embolic source seen in the neck. Electronically Signed   By: Monte Fantasia M.D.   On: 07/15/2020 09:57    CT HEAD CODE STROKE WO CONTRAST   Result Date: 07/15/2020 CLINICAL DATA:  Code stroke.  Aphasia EXAM: CT HEAD WITHOUT CONTRAST TECHNIQUE: Contiguous axial images were obtained from the base of the skull through the vertex without intravenous contrast. COMPARISON:  None. FINDINGS: Brain: Cytotoxic edema in the anterior insula, anterior inferior frontal lobe, and high lateral frontal lobe. Borderline involvement of the putamen. No hemorrhage, hydrocephalus, or pre-existing infarct. Cerebral volume  loss without specific pattern. Vascular: Dense left M2 branch at the level of infarct. Skull: Negative Sinuses/Orbits: Negative Other: These results were called by telephone at the time of interpretation on 07/15/2020 at 9:36 am to Dr Sal , who verbally acknowledged these results. ASPECTS [facility] Stroke Program Early CT Score) - Ganglionic level infarction (caudate, lentiform nuclei, internal capsule, insula, M1-M3 cortex): 4-5 - Supraganglionic infarction (M4-M6 cortex): 2 Total score (0-10 with 10 being normal): 6-7 IMPRESSION: 1. 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 ASPECTS is 6-7. 2. No acute hemorrhage. Electronically Signed   By: Monte Fantasia M.D.   On: 07/15/2020 09:40    CT ANGIO HEAD CODE STROKE   Result Date: 07/15/2020 CLINICAL DATA:  Code stroke with abnormal head CT EXAM: CT ANGIOGRAPHY HEAD AND NECK CT PERFUSION BRAIN TECHNIQUE: Multidetector CT imaging of the head and neck was performed using the standard protocol during bolus administration of intravenous contrast. Multiplanar CT image reconstructions and MIPs were obtained to evaluate the vascular anatomy. Carotid stenosis measurements (when applicable) are obtained utilizing NASCET criteria, using the distal internal carotid diameter as the denominator. Multiphase CT imaging of the brain was performed following IV bolus contrast injection. Subsequent parametric perfusion maps were calculated using RAPID software. CONTRAST:  157mL OMNIPAQUE IOHEXOL 350 MG/ML SOLN COMPARISON:  Noncontrast CT from earlier today FINDINGS: CTA NECK FINDINGS Aortic arch: 2 vessel arch.  Mild atheromatous plaque Right carotid system: Low-density plaque at the bifurcation without common carotid or ICA stenosis. No ulceration or dissection. Left carotid system: Low-density plaque at the bifurcation without stenosis or ulceration. Vertebral arteries: Subclavian atherosclerosis. Codominant vertebral arteries that are smoothly contoured and  widely patent to the dura. Skeleton: No acute finding. Severe cervical spine degeneration with ridging causing foraminal an cord impingement. Other neck: Negative Upper chest: Negative Review of the MIP images confirms the above findings CTA HEAD FINDINGS Anterior circulation: Mild atheromatous calcification at the siphons. Left M2 branch occlusion correlating with a hyperdensity by CT. No additional branch occlusion is seen. Negative for aneurysm. Posterior circulation: The vertebral and basilar arteries are smooth and widely patent. No branch occlusion, beading, or aneurysm. Venous sinuses: Negative Anatomic variants: Negative Review of the MIP images confirms the above findings CT Brain Perfusion Findings: ASPECTS: 6-7 CBF (<30%) Volume: 32mL Perfusion (Tmax>6.0s) volume: 4mL Mismatch Volume: 65mL Infarction Location:Anterior division left MCA territory Case discussed with Dr. Milas Gain while in progress. IMPRESSION: 1. Left M2 occlusion. CT perfusion reports 26 cc of core infarct with 17 cc of penumbra, but based on noncontrast head CT the penumbra of may be overestimated. 2. Overall mild atherosclerosis for age. No embolic source seen  in the neck. Electronically Signed   By: Monte Fantasia M.D.   On: 07/15/2020 09:57    CT ANGIO NECK CODE STROKE   Result Date: 07/15/2020 CLINICAL DATA:  Code stroke with abnormal head CT EXAM: CT ANGIOGRAPHY HEAD AND NECK CT PERFUSION BRAIN TECHNIQUE: Multidetector CT imaging of the head and neck was performed using the standard protocol during bolus administration of intravenous contrast. Multiplanar CT image reconstructions and MIPs were obtained to evaluate the vascular anatomy. Carotid stenosis measurements (when applicable) are obtained utilizing NASCET criteria, using the distal internal carotid diameter as the denominator. Multiphase CT imaging of the brain was performed following IV bolus contrast injection. Subsequent parametric perfusion maps were calculated using  RAPID software. CONTRAST:  172mL OMNIPAQUE IOHEXOL 350 MG/ML SOLN COMPARISON:  Noncontrast CT from earlier today FINDINGS: CTA NECK FINDINGS Aortic arch: 2 vessel arch.  Mild atheromatous plaque Right carotid system: Low-density plaque at the bifurcation without common carotid or ICA stenosis. No ulceration or dissection. Left carotid system: Low-density plaque at the bifurcation without stenosis or ulceration. Vertebral arteries: Subclavian atherosclerosis. Codominant vertebral arteries that are smoothly contoured and widely patent to the dura. Skeleton: No acute finding. Severe cervical spine degeneration with ridging causing foraminal an cord impingement. Other neck: Negative Upper chest: Negative Review of the MIP images confirms the above findings CTA HEAD FINDINGS Anterior circulation: Mild atheromatous calcification at the siphons. Left M2 branch occlusion correlating with a hyperdensity by CT. No additional branch occlusion is seen. Negative for aneurysm. Posterior circulation: The vertebral and basilar arteries are smooth and widely patent. No branch occlusion, beading, or aneurysm. Venous sinuses: Negative Anatomic variants: Negative Review of the MIP images confirms the above findings CT Brain Perfusion Findings: ASPECTS: 6-7 CBF (<30%) Volume: 89mL Perfusion (Tmax>6.0s) volume: 67mL Mismatch Volume: 99mL Infarction Location:Anterior division left MCA territory Case discussed with Dr. Milas Gain while in progress. IMPRESSION: 1. Left M2 occlusion. CT perfusion reports 26 cc of core infarct with 17 cc of penumbra, but based on noncontrast head CT the penumbra of may be overestimated. 2. Overall mild atherosclerosis for age. No embolic source seen in the neck. Electronically Signed   By: Monte Fantasia M.D.   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 Does the need for close, 24 hr/day medical supervision in concert with the patient's rehab needs make it  unreasonable for this patient to be served in a less intensive setting? Yes 2. Co-Morbidities requiring supervision/potential complications: aphasia, DM, BMI of 36, hx of colon CA and partial colectomy- on anti-diarrheals; HTN, apraxia and B/B incontinence 3. Due to bladder management, bowel management, safety, skin/wound care, disease management, medication administration and patient education, does the patient require 24 hr/day rehab nursing? Yes 4. Does the patient require coordinated care of a physician, rehab nurse, therapy disciplines of PT, OT and SLP to address physical and functional deficits in the context of the above medical diagnosis(es)? Yes Addressing deficits in the following areas: balance, endurance, locomotion, strength, transferring, bowel/bladder control, bathing, dressing, feeding, grooming, toileting, cognition, speech and language 5. Can the patient actively participate in an intensive therapy program of at least 3 hrs of therapy per day at least 5 days per week? Yes 6. The potential for patient to make measurable gains while on inpatient rehab is good 7. Anticipated functional outcomes upon discharge from inpatient rehab are modified independent, supervision and min assist  with PT, supervision and min assist  with OT, min assist with SLP. 8. Estimated rehab length of stay to reach the above functional goals is: 2-3 weeks 9. Anticipated discharge destination: Home 10. Overall Rehab/Functional Prognosis: good   RECOMMENDATIONS: This patient's condition is appropriate for continued rehabilitative care in the following setting: CIR Patient has agreed to participate in recommended program. Yes Note that insurance prior authorization may be required for reimbursement for recommended care.   Comment:  1. 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  2. Will send  to admissions coordinators for admission 3. Thank you for this consult   Cathlyn Parsons, PA-C 07/17/2020

## 2020-07-19 NOTE — Progress Notes (Signed)
Inpatient Rehabilitation Care Coordinator Assessment and Plan Patient Details  Name: Edwin Wall MRN: [id number] Date of Birth: [birth year]  Today's Date: 07/19/2020  Hospital Problems: Principal Problem:   Left middle cerebral artery stroke [facility]) Active Problems:   Type II diabetes mellitus ([place])   New onset atrial fibrillation ([place])   Aphasia   Acute right hemiparesis ([place])  Past Medical History:  Past Medical History:  Diagnosis Date  . Anxiety   . Arthritis   . Cataract   . Colon cancer ([place]) dx'd 11/2016   "stage 3"  . Gout   . Gout   . Hemorrhoids   . History of kidney stones    "passed it"  . Hypertension   . Pre-diabetes    Past Surgical History:  Past Surgical History:  Procedure Laterality Date  . COLECTOMY  12/17/2016   lap; partial sigmoid colectomy/notes 12/17/2016  . COLONOSCOPY W/ BIOPSIES AND POLYPECTOMY  11/11/2016  . LAPAROSCOPIC SIGMOID COLECTOMY [name] 12/17/2016   Procedure: LAPAROSCOPIC SIGMOID COLECTOMY;  Surgeon: Edwin Klein, MD;  Location: [place];  Service: General;  Laterality: [name];  . NASAL SEPTUM SURGERY    . TONSILLECTOMY     Social History:  reports that he has never smoked. He has never used smokeless tobacco. He reports that he does not drink alcohol and does not use drugs.  Family / Support Systems Marital Status: Married Patient Roles: Spouse,Parent Spouse/Significant Other: Edwin Wall [phone] Children: Son whom lives with them and daughter in [place] Other Supports: Friends and neighbors Anticipated Caregiver: Wife Ability/Limitations of Caregiver: Supervision-min assist Caregiver Availability: 24/7 Family Dynamics: Close with both children-son has severe OCD and has difficulty leaving the home. Thier daughter lives in [place] and works along with her family. Both feel they have good supports for their ages  Social History Preferred language: English Religion: Baptist Cultural Background: No issues Education:  HS Read: Yes Write: Yes Employment Status: Retired Public relations account executive Issues: No issues Guardian/Conservator: None-according to MD pt is capable of making is own decisions while here   Abuse/Neglect Abuse/Neglect Assessment Can Be Completed: Yes Physical Abuse: Denies Verbal Abuse: Denies Sexual Abuse: Denies Exploitation of patient/patient's resources: Denies Self-Neglect: Denies  Emotional Status Pt's affect, behavior and adjustment status: Pt is doing better and reports he can talk better today, which is encouraging to him. He has always been independent and taken care of himself until now. He had cancer in 2018 but did well with this. He is hopeful he will do well again Recent Psychosocial Issues: other health issues and cancer in 2018 Psychiatric History: History of anxiety-takes meds for this and finds them helpful. Would benefit from seeing neuro-psych while here Substance Abuse History: No issues  Patient / Family Perceptions, Expectations & Goals Pt/Family understanding of illness & functional limitations: Pt and wife can explain his stroke and deficits. Both do talk with the MD and feel they have a good understanding of his stroke. Both feel questions and concerns are being addressed. Premorbid pt/family roles/activities: Husband, father, retiree, church member, etc Anticipated changes in roles/activities/participation: resume Pt/family expectations/goals: Pt states: "I want to do for myself when I leave here."  Wife states: " I hope he does well and recovers from this."  [employer]: None Premorbid Home Care/DME Agencies: Other (Comment) (has a tub seat) Transportation available at discharge: Wife Resource referrals recommended: Neuropsychology  Discharge Planning Living Arrangements: Spouse/significant other,Children Support Systems: Spouse/significant other,Children,Other relatives,Friends/neighbors,Church/faith community Type of  Residence: Private residence Insurance Resources: [employer] (  specify) Web designer) Financial Resources: Radio broadcast assistant Screen Referred: No Living Expenses: Own Money Management: Patient,Spouse Does the patient have any problems obtaining your medications?: No Home Management: Wife does inside work pt did the yard work Industrial/product designer: Return home with wife who will be his primary caregiver and son is there but unsure if will assist, due to his own issues of OCD. Aware team evaluating today and will set goals. Team conference Tuesday to give target Dc date. Wife here to observe in therapies today Care Coordinator Barriers to Discharge: Decreased caregiver support Care Coordinator Barriers to Discharge Comments: Wife is elderly and has own health issues Care Coordinator Anticipated Follow Up Needs: HH/OP  Clinical Impression Pleasant gentleman who is motivated to improve and recover from his stroke. His wife is here and supportive, but does need him to be at least min to supervision level at discharge to be able to manage him. Their daughter will come over from [place] when able and adult son lives in home but has his own issues. Will ask neuro-psych to see for coping while here  Edwin Wall 07/19/2020, 9:57 AM

## 2020-07-19 NOTE — Evaluation (Signed)
Occupational Therapy Assessment and Plan  Patient Details  Name: Edwin Wall MRN: [id number] Date of Birth: 10/25/1940  OT Diagnosis: cognitive deficits and perceptual deficits, balance deficits Rehab Potential: Rehab Potential (ACUTE ONLY): Excellent ELOS: 7-10 days   Today's Date: 07/19/2020 OT Individual Time: 1100-1200 OT Individual Time Calculation (min): 60 min     Hospital Problem: Principal Problem:   Left middle cerebral artery stroke ([facility]) Active Problems:   Type II diabetes mellitus ([place])   New onset atrial fibrillation ([facility])   Aphasia   Acute right hemiparesis ([place])   Past Medical History:  Past Medical History:  Diagnosis Date  . Anxiety   . Arthritis   . Cataract   . Colon cancer ([place]) dx'd 11/2016   "stage 3"  . Gout   . Gout   . Hemorrhoids   . History of kidney stones    "passed it"  . Hypertension   . Pre-diabetes    Past Surgical History:  Past Surgical History:  Procedure Laterality Date  . COLECTOMY  12/17/2016   lap; partial sigmoid colectomy/notes 12/17/2016  . COLONOSCOPY W/ BIOPSIES AND POLYPECTOMY  11/11/2016  . LAPAROSCOPIC SIGMOID COLECTOMY [name] 12/17/2016   Procedure: LAPAROSCOPIC SIGMOID COLECTOMY;  Surgeon: [name], MD;  Location: [place];  Service: General;  Laterality: [name];  . NASAL SEPTUM SURGERY    . TONSILLECTOMY      Assessment & Plan Clinical Impression: LEHI PHIFER present is a 80 year old right-handed male with history of prediabetes, hypertension, colon cancer with colectomy 2018, obesity with BMI 36.24, anxiety. History taken from chart review due to lethargy. Patient lives with spouse. Independent prior to admission. 1 level home 3 steps to entry. He presented on 07/15/2020 with acute right hemiparesis, dysarthria, and facial droop. 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 No acute hemorrhage. Patient did not receive tPA. CT angiogram of head and neck  showed left M2 occlusion. 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 No evidence of hemorrhagic conversion no significant mass-effect. Admission chemistries unremarkable except glucose 155, creatinine 1.52, SARS coronavirus negative, hemoglobin A1c 7.1, TSH within normal limits. Hospital course further complicated by new onset atrial fibrillation with cardiac rate controlled. Echocardiogram with ejection fraction 50-55%, no wall motion abnormalities.. Currently maintained on aspirin 325 mg daily awaiting plan to possibly begin Eliquis 5/20 - 07/20/2020 for further stroke prevention holding off acutely due to large size of high risk infarct. Tolerating a regular consistency diet however there was reports of some dysphagia with pills as well as bouts of nausea vomiting and awaiting plan for follow-up swallow study versus esophagram that showed somewhat limited evaluation mild to moderate intermittent esophageal dysmotility otherwise unremarkable.. Therapy evaluations completed due to patient's right hemiparesis, decreased functional mobility was admitted for a comprehensive rehab program. Please see preadmission assessment from earlier today as well.    Patient transferred to CIR on 07/18/2020 .    Patient currently requires min with basic self-care skills secondary to decreased cardiorespiratoy endurance, decreased awareness of deficits, decreased attention to right, decreased attention, decreased problem solving and decreased memory and decreased sitting balance, decreased standing balance, decreased postural control and decreased balance strategies.  Prior to hospitalization, patient was fully independent, driving, active within the home.  Patient will benefit from skilled intervention to increase independence with basic self-care skills prior to discharge home  with care partner.   Anticipate patient will require 24 hour supervision and follow up home health.  OT - End of Session Endurance Deficit: Yes OT Assessment Rehab Potential (ACUTE ONLY): Excellent OT Patient demonstrates impairments in the following area(s): Balance;Cognition;Motor;Perception;Sensory OT Basic ADL's Functional Problem(s): Grooming;Bathing;Dressing;Toileting OT Transfers Functional Problem(s): Toilet;Tub/Shower OT Additional Impairment(s): None OT Plan OT Intensity: Minimum of 1-2 x/day, 45 to 90 minutes OT Frequency: 5 out of 7 days OT Duration/Estimated Length of Stay: 7-10 days OT Treatment/Interventions: Balance/vestibular training;Cognitive remediation/compensation;Discharge planning;DME/adaptive equipment instruction;Functional mobility training;Neuromuscular re-education;Pain management;Psychosocial support;Patient/family education;Self Care/advanced ADL retraining;Therapeutic Activities;Therapeutic Exercise;UE/LE Strength taining/ROM;UE/LE Coordination activities;Visual/perceptual remediation/compensation OT Self Feeding Anticipated Outcome(s): independent OT Basic Self-Care Anticipated Outcome(s): supervision OT Toileting Anticipated Outcome(s): supervision OT Bathroom Transfers Anticipated Outcome(s): supervision OT Recommendation Patient destination: Home Follow Up Recommendations: Home health OT Equipment Recommended: None recommended by OT   OT Evaluation Precautions/Restrictions  Precautions Precautions: Fall Precaution Comments: R side inattention Restrictions Weight Bearing Restrictions: No   Pain Pain Assessment Pain Scale: 0-10 Pain Score: 0-No pain Home Living/Prior Functioning Home Living Family/patient expects to be discharged to:: Private residence Living Arrangements: Spouse/significant other,Children Available Help at Discharge: Family,Available 24 hours/day Type of Home: House Home Access: Stairs to enter [employer] of Steps: 5 in front of  home and 2 in back of home Entrance Stairs-Rails: Right,Left,Can reach both (on front 5 steps. no rails on back steps) Home Layout: One level Bathroom Shower/Tub: Walk-in shower,Tub/shower Personal assistant: Handicapped height Bathroom Accessibility: Yes  Lives With: Spouse Prior Function Level of Independence: Independent with basic ADLs,Independent with gait,Independent with transfers,Independent with homemaking with ambulation  Able to Take Stairs?: Yes Driving: Yes Vocation: Retired Comments: independent with ADLs, IADLs, and driving Vision Baseline Vision/History: Wears glasses Wears Glasses: At all times Patient Visual Report: Other (comment) (decreased attention vs. decreased vision on peripheral R) Vision Assessment?: Yes Eye Alignment: Within Functional Limits Ocular Range of Motion: Within Functional Limits Alignment/Gaze Preference: Gaze left Tracking/Visual Pursuits: Decreased smoothness of horizontal tracking;Requires cues, head turns, or add eye shifts to track Convergence: Impaired (comment) (R eye does not convergence) Visual Fields: Right visual field deficit Perception  Perception: Impaired Inattention/Neglect: Does not attend to right visual field (limited but can attend with cues) Praxis Praxis: Intact Cognition Overall Cognitive Status: Difficult to assess Arousal/Alertness: Awake/alert Orientation Level: Person (difficult to assess due to expressive aphasia) Year:  (difficult to assess due to expressive aphasia, chose 2022 from written list) Month:  (difficult to assess due to expressive aphasia, chose May from written list) Day of Week: Incorrect Memory: Impaired Memory Impairment: Retrieval deficit Immediate Memory Recall: Sock;Blue;Bed Memory Recall Sock: Without Cue Memory Recall Blue: With Cue Memory Recall Bed: Without Cue Attention: Sustained;Alternating Sustained Attention: Appears intact Awareness: Impaired Awareness Impairment:  Intellectual impairment;Emergent impairment ("nothing is different about me or wrong with me") Problem Solving: Impaired Problem Solving Impairment: Verbal complex;Functional complex Behaviors: Impulsive Safety/Judgment: Impaired Sensation Sensation Light Touch: Impaired by gross assessment (pt reported decreased sensation on R side compared to L) Hot/Cold: Appears Intact Proprioception: Impaired by gross assessment Stereognosis: Not tested Coordination Gross Motor Movements are Fluid and Coordinated: No Fine Motor Movements are Fluid and Coordinated: No Finger Nose Finger Test: able to complete with B hands but at a slower pace Motor  Motor Motor: Within Functional Limits  Trunk/Postural Assessment  Cervical Assessment Cervical Assessment: Within Functional Limits Thoracic Assessment Thoracic Assessment: Within Functional Limits Lumbar Assessment Lumbar Assessment: Exceptions to [facility] (posterior pelvic tilt) Postural Control Postural Control:  Deficits on evaluation Protective Responses: decreased Postural Limitations: decreased  Balance Balance Balance Assessed: Yes Standardized Balance Assessment Standardized Balance Assessment: Timed Up and Go Test Timed Up and Go Test TUG: Normal TUG Normal TUG (seconds): 21 Static Sitting Balance Static Sitting - Balance Support: Feet supported;No upper extremity supported Static Sitting - Level of Assistance: 5: Stand by assistance Dynamic Sitting Balance Dynamic Sitting - Balance Support: Feet supported;No upper extremity supported Dynamic Sitting - Level of Assistance: 5: Stand by assistance Dynamic Sitting - Balance Activities: Lateral lean/weight shifting;Forward lean/weight shifting;Reaching across midline;Reaching for objects Static Standing Balance Static Standing - Balance Support: During functional activity;No upper extremity supported Static Standing - Level of Assistance: 4: Min assist Dynamic Standing Balance Dynamic  Standing - Balance Support: During functional activity;No upper extremity supported Dynamic Standing - Level of Assistance: 4: Min assist Dynamic Standing - Balance Activities: Lateral lean/weight shifting;Forward lean/weight shifting;Reaching for weighted objects Extremity/Trunk Assessment RUE Assessment RUE Assessment: Within Functional Limits LUE Assessment LUE Assessment: Within Functional Limits  Care Tool Care Tool Self Care Eating   Eating Assist Level: Set up assist    Oral Care    Oral Care Assist Level: Set up assist    Bathing   Body parts bathed by patient: Right arm;Left arm;Chest;Abdomen;Front perineal area;Buttocks;Left lower leg;Right lower leg;Left upper leg;Right upper leg;Face     Assist Level: Contact Guard/Touching assist    Upper Body Dressing(including orthotics)   What is the patient wearing?: Pull over shirt   Assist Level: Set up assist    Lower Body Dressing (excluding footwear)   What is the patient wearing?: Pants;Incontinence brief Assist for lower body dressing: Contact Guard/Touching assist    Putting on/Taking off footwear   What is the patient wearing?: Ted hose;Shoes Assist for footwear: Minimal Assistance - Patient > 75%       Care Tool Toileting Toileting activity   Assist for toileting: Contact Guard/Touching assist     Care Tool Bed Mobility Roll left and right activity   Roll left and right assist level: Minimal Assistance - Patient > 75%    Sit to lying activity   Sit to lying assist level: Minimal Assistance - Patient > 75%    Lying to sitting edge of bed activity   Lying to sitting edge of bed assist level: Minimal Assistance - Patient > 75%     Care Tool Transfers Sit to stand transfer   Sit to stand assist level: Minimal Assistance - Patient > 75%    Chair/bed transfer   Chair/bed transfer assist level: Minimal Assistance - Patient > 75%     Toilet transfer   Assist Level: Minimal Assistance - Patient > 75%      Care Tool Cognition Expression of Ideas and Wants Expression of Ideas and Wants: Some difficulty - exhibits some difficulty with expressing needs and ideas (e.g, some words or finishing thoughts) or speech is not clear   Understanding Verbal and Non-Verbal Content Understanding Verbal and Non-Verbal Content: Usually understands - understands most conversations, but misses some part/intent of message. Requires cues at times to understand   Memory/Recall Ability *first 3 days only Memory/Recall Ability *first 3 days only: None of the above were recalled    Refer to Care Plan for [place] 1 OT Short Term Goal 1 (Week 1): STGs = LTGs  Recommendations for other services: None    Skilled Therapeutic Intervention ADL ADL Eating: Set up Grooming: Setup Upper Body Bathing:  Setup Where Assessed-Upper Body Bathing: Shower Lower Body Bathing: Contact guard Where Assessed-Lower Body Bathing: Shower Upper Body Dressing: Supervision/safety Where Assessed-Upper Body Dressing: Edge of bed Lower Body Dressing: Contact guard Where Assessed-Lower Body Dressing: Edge of bed Toileting: Contact guard Where Assessed-Toileting: Glass blower/designer: Therapist, music Method: Counselling psychologist: Energy manager: Curator Method: Heritage manager: Photographer  Bed Mobility Bed Mobility: Rolling Right;Rolling Left;Supine to Sit;Sit to Supine;Scooting to [facility];Sitting - Scoot to [name] of Bed Rolling Right: Minimal Assistance - Patient > 75% Rolling Left: Minimal Assistance - Patient > 75% Supine to Sit: Minimal Assistance - Patient > 75% Sitting - Scoot to Edge of Bed: Minimal Assistance - Patient > 75% Sit to Supine: Minimal Assistance - Patient > 75% Scooting to HOB: Minimal Assistance - Patient > 75% Transfers Sit to Stand: Minimal Assistance -  Patient > 75% Stand to Sit: Minimal Assistance - Patient > 75%   Pt seen for initial evaluation and ADL training with a focus on family ed, balance, R side awareness.  Pt completed toileting, shower, and dressing with min cues to fully attend to R side, balance as he would lean to R in sit and stand.  He has significant difficulty with word finding and expression.  Discussed goals, ELOS, POC with pt and spouse. Sitting in recliner with all needs met.    Discharge Criteria: Patient will be discharged from OT if patient refuses treatment 3 consecutive times without medical reason, if treatment goals not met, if there is a change in medical status, if patient makes no progress towards goals or if patient is discharged from hospital.  The above assessment, treatment plan, treatment alternatives and goals were discussed and mutually agreed upon: by patient  [facility] 07/19/2020, 12:36 PM

## 2020-07-19 NOTE — Progress Notes (Signed)
[facility] Individual Statement of Services  Patient Name:  Edwin Wall  Date:  07/19/2020  Welcome to the [facility].  Our goal is to provide you with an individualized program based on your diagnosis and situation, designed to meet your specific needs.  With this comprehensive rehabilitation program, you will be expected to participate in at least 3 hours of rehabilitation therapies Monday-Friday, with modified therapy programming on the weekends.  Your rehabilitation program will include the following services:  Physical Therapy (PT), Occupational Therapy (OT), Speech Therapy (ST), 24 hour per day rehabilitation nursing, Neuropsychology, Care Coordinator, Rehabilitation Medicine, Nutrition Services and Pharmacy Services  Weekly team conferences will be held on Tuesday to discuss your progress.  Your Inpatient Rehabilitation Care Coordinator will talk with you frequently to get your input and to update you on team discussions.  Team conferences with you and your family in attendance may also be held.  Expected length of stay: 10-14 days  Overall anticipated outcome: supervision level  Depending on your progress and recovery, your program may change. Your Inpatient Rehabilitation Care Coordinator will coordinate services and will keep you informed of any changes. Your Inpatient Rehabilitation Care Coordinator's name and contact numbers are listed  below.  The following services may also be recommended but are not provided by the [facility] will be made to provide these services after discharge if needed.  Arrangements include referral to agencies that provide these services.  Your insurance has been verified to be:  [employer] Your primary doctor is:  Anastasia Pall  Pertinent information will be  shared with your doctor and your insurance company.  Inpatient Rehabilitation Care Coordinator:  Ovidio Kin, [place] or Emilia Beck  Information discussed with and copy given to patient by: [name], 07/19/2020, 9:59 AM

## 2020-07-19 NOTE — Progress Notes (Signed)
PROGRESS NOTE   Subjective/Complaints:  Pt denies pain- but said there's something "going on about the district"- but wasn't clear at all.  Also initially said wasn't here to do therapy- and wouldn't do it- did mention to pt his wife wanted him here, and to do therapy.   He grudgingly said yes when asked again if he would participate.   Did mention he thought I was difficult for making him do therapy.    ROS: Limited by cognition/aphasia  Objective:   CT HEAD WO CONTRAST  Result Date: 07/17/2020 CLINICAL DATA:  Follow-up CVA. EXAM: CT HEAD WITHOUT CONTRAST TECHNIQUE: Contiguous axial images were obtained from the base of the skull through the vertex without intravenous contrast. COMPARISON:  Head CT 07/15/2020 and MRI 07/13/2020. FINDINGS: Brain: Slight evolutionary change and the large left MCA territory infarct with more clearly defined area of low attenuation. No findings suspicious for acute hemorrhage. No new mass effect. No new infarcts are identified. Stable underlying age related cerebral atrophy and ventriculomegaly. Vascular: Stable vascular calcifications but no aneurysm or hyperdense vessels. Skull: No skull fracture or bone lesions. Sinuses/Orbits: Chronic right half sphenoid sinus disease. The paranasal sinuses and mastoid air cells are otherwise clear. The globes are intact. Other: No scalp lesions or scalp hematoma. IMPRESSION: 1. Slight evolutionary change in the large left MCA territory infarct. No findings suspicious for acute hemorrhage. No new infarcts. 2. Stable underlying age related cerebral atrophy and ventriculomegaly. 3. Chronic right half sphenoid sinus disease. Electronically Signed   By: Marijo Sanes M.D.   On: 07/17/2020 13:40   DG ESOPHAGUS W SINGLE CM (SOL OR THIN BA)  Result Date: 07/18/2020 CLINICAL DATA:  Dysphagia. Additional history provided: Acute on chronic trouble swallowing following acute  stroke, also concern for aspiration with vomiting yesterday. EXAM: ESOPHOGRAM / BARIUM SWALLOW / BARIUM TABLET STUDY TECHNIQUE: A single contrast examination was performed using thick and thin barium liquid. The patient attempted to swallow a 13 mm barium sulphate tablet. FLUOROSCOPY TIME:  Fluoroscopy Time: 2 minutes, 24 seconds (33.5 mGy). Radiation Exposure Index (if provided by the fluoroscopic device): 33.50 mGy Number of Acquired Spot Images: 1 COMPARISON:  CT abdomen/pelvis 11/13/2016. FINDINGS: Somewhat limited evaluation due to limited patient mobility. Fluoroscopic evaluation demonstrates a mildly patulous esophagus. No evidence of fixed stricture, mass or mucosal abnormality. Mild-to-moderate intermittent esophageal dysmotility with tertiary contractions. No hiatal hernia identified. No gastroesophageal reflux observed. The patient was unable to initiate swallowing of a 13 mm barium tablet. IMPRESSION: Somewhat limited evaluation due to limited patient mobility. Mildly patulous esophagus. 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 The patient was unable to initiate swallowing of a 13 mm barium tablet. Otherwise unremarkable esophagram, as described. Electronically Signed   By: Kellie Simmering DO   On: 07/18/2020 13:30   Recent Labs    07/18/20 0343 07/19/20 0521  WBC 12.0* 9.9  HGB 14.9 14.2  HCT 47.0 44.1  PLT 215 190   Recent Labs    07/18/20 0343 07/19/20 0521  NA 139 139  K 4.0 3.9  CL 106 105  CO2 25 25  GLUCOSE 171* 133*  BUN 25* 24*  CREATININE 1.81* 1.68*  CALCIUM 9.1 9.2  No intake or output data in the 24 hours ending 07/19/20 0905      Physical Exam: Vital Signs Blood pressure 134/76, pulse (!) 54, temperature 98.4 F (36.9 C), temperature source Oral, resp. rate 20, SpO2 96 %.   General: awake, alert, appropriate, NAD HENT: conjugate gaze; oropharynx moist CV: bradycardic rate- irregular rhythm; no JVD Pulmonary: CTA B/L; no W/R/R- good air  movement GI: soft, NT, ND, (+)BS- hypoactive Psychiatric: inappropriate responses- flat; irritated Neurological: unable to assess due to aphasia- which is moderate to severe- speaking some words, but not in sentences. Also perseverates significantly.   Musculoskeletal:     Cervical back: Normal range of motion and neck supple.     Comments: No edema or tenderness in extremities  Skin:    General: Skin is warm and dry.  Motor: Limited, however able to move all extremities spontaneously - didn't want to participate in MS exam     Assessment/Plan: 1. Functional deficits which require 3+ hours per day of interdisciplinary therapy in a comprehensive inpatient rehab setting.  Physiatrist is providing close team supervision and 24 hour management of active medical problems listed below.  Physiatrist and rehab team continue to assess barriers to discharge/monitor patient progress toward functional and medical goals  Care Tool:  Bathing              Bathing assist       Upper Body Dressing/Undressing Upper body dressing   What is the patient wearing?: Hospital gown only    Upper body assist Assist Level: Minimal Assistance - Patient > 75%    Lower Body Dressing/Undressing Lower body dressing      What is the patient wearing?: Hospital gown only,Incontinence brief     Lower body assist Assist for lower body dressing: Minimal Assistance - Patient > 75%     Toileting Toileting    Toileting assist Assist for toileting: Maximal Assistance - Patient 25 - 49%     Transfers Chair/bed transfer  Transfers assist     Chair/bed transfer assist level: Minimal Assistance - Patient > 75%     Locomotion Ambulation   Ambulation assist              Walk 10 feet activity   Assist           Walk 50 feet activity   Assist           Walk 150 feet activity   Assist           Walk 10 feet on uneven surface  activity   Assist            Wheelchair     Assist               Wheelchair 50 feet with 2 turns activity    Assist            Wheelchair 150 feet activity     Assist          Blood pressure 134/76, pulse (!) 54, temperature 98.4 F (36.9 C), temperature source Oral, resp. rate 20, SpO2 96 %.  Medical Problem List and Plan: 1.  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  Antithrombotics: -DVT/anticoagulation: Awaiting plan transition to Eliquis             -antiplatelet therapy: Currently on aspirin 325 mg daily awaiting transition to Eliquis for stroke prevention 3. Pain Management: Tylenol as needed  5/19- pt denied pain- con't tylenol prn 4. Mood: Ativan 1 mg daily.  Provide emotional support             -antipsychotic agents: [name] 5. Neuropsych: This patient is? capable of making decisions on his own behalf. 6. Skin/Wound Care: Routine skin checks 7. Fluids/Electrolytes/Nutrition: Routine in and outs             CMP ordered for tomorrow a.m. 8.  Hypertension             Monitor increase activity 9.  Hyperlipidemia.  Crestor/Colestid 10.  New onset atrial fibrillation.  Cardiac rate controlled.  Await plan to begin Eliquis             Monitor with increased activity 11.  Prediabetes.  Hemoglobin A1c 6.6 2018 and 7.1 on this admission.  He was on no glucose lowering agents at home.  Currently SSI.  Await plan to possibly begin metformin.  5/19- will call pharmacy about meds since don't want to start Metformin with his current GFR               Monitor increase mobility 12.  History of colon cancer with colectomy 2018.  Follow-up outpatient 13.  Obesity.  BMI 36.24.   Dietary follow-up 14.  AKI/CKD.  Creatinine 1.81.  Follow-up chemistries             5/19- Cr slightly less at 1.68- and BUN 24- GFR 41- will not start metformin due to GFR- will call phamacy med recs     LOS: 1 days A FACE TO FACE EVALUATION WAS PERFORMED  Edwin Wall 07/19/2020, 9:05 AM

## 2020-07-19 NOTE — Progress Notes (Signed)
PMR Admission Coordinator Pre-Admission Assessment   Patient: Edwin Wall is an 80 y.o., male MRN: [id number] DOB: 03-16-1940 Height: 5' 9.5" (176.5 cm) Weight: 112.9 kg                                                                                                                                                  Insurance Information HMO:     PPO:      PCP:      IPA:      80/20:      OTHER:  PRIMARY: Medicare A and B      Policy#: [id number]      Subscriber: pt CM Name:       Phone#:      Fax#:  Pre-Cert#: verified Civil engineer, contracting:  Benefits:  Phone #:      Name:  Eff. Date: A 12/01/05; B 09/01/07     Deduct: $1556      Out of Pocket Max: n/a      Life Max: n/a  CIR: 100%      SNF: 20 full days Outpatient: 80%     Co-Ins: 20% Home Health: 100%      Co-Pay:  DME: 80%     Co-Ins: 20% Providers: pt choice SECONDARY: [employer]      Policy#: [id number]      Phone#:    Financial Counselor:       Phone#:    The "Data Collection Information Summary" for patients in Inpatient Rehabilitation Facilities with attached "Privacy Act [place] Records" was provided and verbally reviewed with: Patient and Family   Emergency Contact Information Contact Information       Name Relation Home Work Mobile    Bendix,[name] Spouse     [phone]         Current Medical History  Patient Admitting Diagnosis: L MCA    History of Present Illness: Edwin Wall present is a 80 year old right-handed male with history of prediabetes, hypertension, colon cancer with colectomy 2018, obesity with BMI 36.24, anxiety.   Presented 07/15/2020 to [facility] with acute onset of right side weakness speech difficulties and facial droop.  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  No acute hemorrhage.  Patient did not receive tPA.  CT angiogram of head and neck showed left M2 occlusion.  MRI of the brain follow-up showed a 6.8 x 3.2 x 5.5  acute cortical subcortical infarct affecting the left frontal operculum, anterior lateral left frontal lobe and left insula.  No evidence of hemorrhagic conversion no significant mass-effect.  Admission chemistries unremarkable except glucose 155, creatinine 1.52, SARS coronavirus negative, hemoglobin A1c 7.1, TSH within normal limits.  Hospital course new onset atrial fibrillation with cardiac rate controlled.  Echocardiogram pending.  Currently maintained on aspirin 325 mg  daily awaiting plan to possibly begin Eliquis 5/20 - 07/20/2020 for further stroke prevention holding off acutely due to large size of high risk infarct.  Tolerating a regular consistency diet however there was reports of some dysphagia with pills as well as bouts of nausea vomiting and awaiting plan for follow-up swallow study versus esophagram..  Therapy evaluations completed due to patient's right side weakness decreased functional mobility was recommended for a comprehensive rehab program.   Complete NIHSS TOTAL: 5 Glasgow Coma Scale Score: 14   Past Medical History      Past Medical History:  Diagnosis Date  . Anxiety    . Arthritis    . Cataract    . Colon cancer ([place]) dx'd 11/2016    "stage 3"  . Gout    . Gout    . Hemorrhoids    . History of kidney stones      "passed it"  . Hypertension    . Pre-diabetes        Family History  family history includes COPD in his father; Cancer in his mother.   Prior Rehab/Hospitalizations:  Has the patient had prior rehab or hospitalizations prior to admission? No   Has the patient had major surgery during 100 days prior to admission? No   Current Medications    Current Facility-Administered Medications:  .  0.9 %  sodium chloride infusion, , Intravenous, Continuous, Rosalin Hawking, MD, Last Rate: 50 mL/hr at 07/18/20 0349, New Bag at 07/18/20 0349 .  acetaminophen (TYLENOL) tablet 650 mg, 650 mg, Oral, Q4H PRN **OR** acetaminophen (TYLENOL) 160 MG/5ML solution 650 mg, 650  mg, Per Tube, Q4H PRN **OR** acetaminophen (TYLENOL) suppository 650 mg, 650 mg, Rectal, Q4H PRN, Aslam, Sadia, MD .  aspirin EC tablet 325 mg, 325 mg, Oral, Daily, 325 mg at 07/18/20 1001 **OR** aspirin suppository 300 mg, 300 mg, Rectal, Daily, Rosalin Hawking, MD, 300 mg at 07/16/20 1007 .  colestipol (COLESTID) tablet 1 g, 1 g, Oral, BID, Jeralyn Bennett, MD .  insulin aspart (novoLOG) injection 0-15 Units, 0-15 Units, Subcutaneous, TID WC, Aslam, Sadia, MD, 2 Units at 07/18/20 1320 .  LORazepam (ATIVAN) tablet 1 mg, 1 mg, Oral, Daily, Jeralyn Bennett, MD, 1 mg at 07/18/20 1001 .  ondansetron (ZOFRAN) tablet 4 mg, 4 mg, Oral, Q8H PRN, Jeralyn Bennett, MD .  rosuvastatin (CRESTOR) tablet 40 mg, 40 mg, Oral, Daily, Jeralyn Bennett, MD, 40 mg at 07/18/20 1001 .  senna-docusate (Senokot-S) tablet 1 tablet, 1 tablet, Oral, QHS PRN, Aslam, Sadia, MD   Patients Current Diet:  Diet Order                  Diet NPO time specified  Diet effective now                         Precautions / Restrictions Precautions Precautions: Fall Restrictions Weight Bearing Restrictions: No    Has the patient had 2 or more falls or a fall with injury in the past year?No   Prior Activity Level Community (5-7x/wk): independent prior to admission, driving   Prior Functional Level Prior Function Level of Independence: Independent Comments: ADLs, IADLs, and driving   Self Care: Did the patient need help bathing, dressing, using the toilet or eating?  Independent   Indoor Mobility: Did the patient need assistance with walking from room to room (with or without device)? Independent   Stairs: Did the patient need assistance with internal or external stairs (  with or without device)? Independent   Functional Cognition: Did the patient need help planning regular tasks such as shopping or remembering to take medications? Independent   Home Assistive Devices / Equipment Home Equipment: Shower seat    Prior Device Use: Indicate devices/aids used by the patient prior to current illness, exacerbation or injury? None of the above   Current Functional Level Cognition   Arousal/Alertness: Awake/alert Overall Cognitive Status: Difficult to assess Difficult to assess due to: Impaired communication (aphasia) Current Attention Level: Sustained,Selective Orientation Level: Oriented to person,Disoriented to place,Disoriented to time,Disoriented to situation Following Commands: Follows one step commands inconsistently,Follows one step commands with increased time Safety/Judgement: Decreased awareness of safety,Decreased awareness of deficits General Comments: pt able to state name and hospital today but stated [place] and was unable to state date or choose correct date when given choices, pt with noted R sided inattention and remains easily distracted, hard time sequencing washing hands at sink, forgot to turn water off, unable to find paper towels on the R despite max verbal cues Attention: Sustained,Alternating Sustained Attention: Appears intact Alternating Attention: Appears intact Memory:  (UTA) Awareness: Impaired Awareness Impairment: Emergent impairment Problem Solving: Appears intact Safety/Judgment: Appears intact    Extremity Assessment (includes Sensation/Coordination)   Upper Extremity Assessment: Defer to OT evaluation  Lower Extremity Assessment: Generalized weakness     ADLs   Overall ADL's : Needs assistance/impaired Eating/Feeding: Set up,Sitting Grooming: Brushing hair,Oral care,Min guard,Standing Upper Body Bathing: Minimal assistance,Sitting Lower Body Bathing: Minimal assistance,Sit to/from stand Upper Body Dressing : Minimal assistance,Sitting Lower Body Dressing: Minimal assistance,Sit to/from stand Lower Body Dressing Details (indicate cue type and reason): don socks Toilet Transfer: Minimal assistance,Regular Toilet,Ambulation Toileting- Clothing Manipulation  and Hygiene: Moderate assistance,Sit to/from stand Toileting - Clothing Manipulation Details (indicate cue type and reason): Pt unaware that while performing peri care he was caught on his gown; unsuccessful at posterior peri care Functional mobility during ADLs: Minimal assistance General ADL Comments: Pt with decreased cognition, balance, and safety     Mobility   Overal bed mobility: Needs Assistance Bed Mobility: Supine to Sit Supine to sit: Min assist General bed mobility comments: minA for trunk elevation, max verbal cues to complete task     Transfers   Overall transfer level: Needs assistance Equipment used: None Transfers: Sit to/from Stand Sit to Stand: Min assist General transfer comment: minA to power and for safety as pt impulsive     Ambulation / Gait / Stairs / Wheelchair Mobility   Ambulation/Gait Ambulation/Gait assistance: Herbalist (Feet): 20 Feet (to/from bathroom) Assistive device: None Gait Pattern/deviations: Step-through pattern,Drifts right/left General Gait Details: pt with lateral sway L/R, unsteady, hard to navigate given instructions and around obstacles, pt with loose stool incontinence in room, pt assisted to the bathroom but ultimately unable to amb in hallway due to loose stool Gait velocity: decr     Posture / Balance Dynamic Sitting Balance Sitting balance - Comments: able to anteriorly translate trunk to don socks Balance Overall balance assessment: Needs assistance Sitting-balance support: No upper extremity supported,Feet supported Sitting balance-Leahy Scale: Good Sitting balance - Comments: able to anteriorly translate trunk to don socks Standing balance support: No upper extremity supported,During functional activity Standing balance-Leahy Scale: Fair Standing balance comment: cannot accept challenge     Special needs/care consideration Diabetic management yes        Previous Home Environment (from acute therapy  documentation) Living Arrangements: Spouse/significant other,Children Available Help at Discharge: Family,Available 24 hours/day Type of  Home: House Home Layout: One level Home Access: Stairs to enter Entrance Stairs-Rails: Right,Left,Can reach both Entrance Stairs-Number of Steps: 3-4 Bathroom Shower/Tub: Ambulance person: Handicapped height   Discharge Living Setting Plans for Discharge Living Setting: Patient's home Type of Home at Discharge: House Discharge Home Layout: One level Discharge Home Access: Stairs to enter Entrance Stairs-Rails: Can reach both Entrance Stairs-Number of Steps: 3.5+1 Discharge Bathroom Shower/Tub: Tub/shower unit,Walk-in shower Discharge Bathroom Toilet: Handicapped height Discharge Bathroom Accessibility: Yes How Accessible: Accessible via walker Does the patient have any problems obtaining your medications?: No   Social/Family/Support Systems Patient Roles: Spouse Anticipated Caregiver: wife, Cortney Mckinney Anticipated Caregiver's Contact Information: [phone] Ability/Limitations of Caregiver: min assist Caregiver Availability: 24/7 Discharge Plan Discussed with Primary Caregiver: Yes Is Caregiver In Agreement with Plan?: Yes Does Caregiver/Family have Issues with Lodging/Transportation while Pt is in Rehab?: No     Goals Patient/Family Goal for Rehab: PT/OT supervision to mod I, SLP min assist to supervision Expected length of stay: 16-19 days Additional Information: pt's adult son also lives at home, he has intellectual disabilities but would be able to provide some help if needed.  He is able to care for himself. Pt/Family Agrees to Admission and willing to participate: Yes Program Orientation Provided & Reviewed with Pt/Caregiver Including Roles  & Responsibilities: Yes     Decrease burden of Care through IP rehab admission: n/a   Possible need for SNF placement upon discharge: Not anticipated      Patient Condition: This patient's condition remains as documented in the consult dated 07/17/2020 , in which the Rehabilitation Physician determined and documented that the patient's condition is appropriate for intensive rehabilitative care in an inpatient rehabilitation facility. Will admit to inpatient rehab today.   Preadmission Screen Completed By:  Michel Santee, PT, DPT 07/18/2020 2:05 PM ______________________________________________________________________   Discussed status with Dr. Posey Pronto on 07/18/20 at 2:05 PM  and received approval for admission today.   Admission Coordinator:  Michel Santee, PT, DPT  Time 2:05 Edwin Wall  Sudie Grumbling 07/18/20

## 2020-07-19 NOTE — H&P (Signed)
Physical Medicine and Rehabilitation Admission H&P    Chief Complaint  Patient presents with  . Code Stroke  : HPI: Edwin Wall present is a 80 year old right-handed male with history of prediabetes, hypertension, colon cancer with colectomy 2018, obesity with BMI 36.24, anxiety.  History taken from chart review due to lethargy. Patient lives with spouse.  Independent prior to admission.  1 level home 3 steps to entry.  He presented on 07/15/2020 with acute right hemiparesis, dysarthria, and facial droop.  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  No acute hemorrhage.  Patient did not receive tPA.  CT angiogram of head and neck showed left M2 occlusion.  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  No evidence of hemorrhagic conversion no significant mass-effect.  Admission chemistries unremarkable except glucose 155, creatinine 1.52, SARS coronavirus negative, hemoglobin A1c 7.1, TSH within normal limits.  Hospital course further complicated by new onset atrial fibrillation with cardiac rate controlled.  Echocardiogram with ejection fraction 50-55%, no wall motion abnormalities..  Currently maintained on aspirin 325 mg daily awaiting plan to possibly begin Eliquis 5/20 - 07/20/2020 for further stroke prevention holding off acutely due to large size of high risk infarct.  Tolerating a regular consistency diet however there was reports of some dysphagia with pills as well as bouts of nausea vomiting and awaiting plan for follow-up swallow study versus esophagram that showed somewhat limited evaluation mild to moderate intermittent esophageal dysmotility otherwise unremarkable..  Therapy evaluations completed due to patient's right hemiparesis, decreased functional mobility was admitted for a comprehensive rehab  program.  Please see preadmission assessment from earlier today as well.  Review of Systems  Unable to perform ROS: Other  Extreme lethargy.  Past Medical History:  Diagnosis Date  . Anxiety   . Arthritis   . Cataract   . Colon cancer ([place]) dx'd 11/2016   "stage 3"  . Gout   . Gout   . Hemorrhoids   . History of kidney stones    "passed it"  . Hypertension   . Pre-diabetes    Past Surgical History:  Procedure Laterality Date  . COLECTOMY  12/17/2016   lap; partial sigmoid colectomy/notes 12/17/2016  . COLONOSCOPY W/ BIOPSIES AND POLYPECTOMY  11/11/2016  . LAPAROSCOPIC SIGMOID COLECTOMY [name] 12/17/2016   Procedure: LAPAROSCOPIC SIGMOID COLECTOMY;  Surgeon: [name], MD;  Location: [facility];  Service: General;  Laterality: [name];  . NASAL SEPTUM SURGERY    . TONSILLECTOMY     Family History  Problem Relation Age of Onset  . Cancer Mother        LUNG  . COPD Father    Social History:  reports that he has never smoked. He has never used smokeless tobacco. He reports that he does not drink alcohol and does not use drugs. Allergies: No Known Allergies Medications Prior to Admission  Medication Sig Dispense Refill  . benazepril (LOTENSIN) 40 MG tablet Take 40 mg by mouth daily.    . diclofenac (VOLTAREN) 75 MG EC tablet Take 75 mg by mouth daily as needed for mild pain.    Marland Kitchen LORazepam (ATIVAN) 1 MG tablet Take 1 mg by mouth daily as needed for anxiety.      Drug Regimen Review Drug regimen was reviewed and remains appropriate with no significant issues identified  Home: Home  Living Family/patient expects to be discharged to:: Private residence Living Arrangements: Spouse/significant other,Children Available Help at Discharge: Family,Available 24 hours/day Type of Home: House Home Access: Stairs to enter [employer] of Steps: 3-4 Entrance Stairs-Rails: Right,Left,Can reach both Home Layout: One level Bathroom Shower/Tub: Interior and spatial designer: Handicapped height Home Equipment: Industrial/product designer History: Prior Function Level of Independence: Independent Comments: ADLs, IADLs, and driving  Functional Status:  Mobility: Bed Mobility Overal bed mobility: Needs Assistance Bed Mobility: Supine to Sit Supine to sit: Min assist General bed mobility comments: minA for trunk elevation, max verbal cues to complete task Transfers Overall transfer level: Needs assistance Equipment used: None Transfers: Sit to/from Stand Sit to Stand: Min assist General transfer comment: minA to power and for safety as pt impulsive Ambulation/Gait Ambulation/Gait assistance: Min assist Gait Distance (Feet): 20 Feet (to/from bathroom) Assistive device: None Gait Pattern/deviations: Step-through pattern,Drifts right/left General Gait Details: pt with lateral sway L/R, unsteady, hard to navigate given instructions and around obstacles, pt with loose stool incontinence in room, pt assisted to the bathroom but ultimately unable to amb in hallway due to loose stool Gait velocity: decr    ADL: ADL Overall ADL's : Needs assistance/impaired Eating/Feeding: Set up,Sitting Grooming: Brushing hair,Oral care,Min guard,Standing Upper Body Bathing: Minimal assistance,Sitting Lower Body Bathing: Minimal assistance,Sit to/from stand Upper Body Dressing : Minimal assistance,Sitting Lower Body Dressing: Minimal assistance,Sit to/from stand Lower Body Dressing Details (indicate cue type and reason): don socks Toilet Transfer: Minimal assistance,Regular Toilet,Ambulation Toileting- Clothing Manipulation and Hygiene: Moderate assistance,Sit to/from stand Toileting - Clothing Manipulation Details (indicate cue type and reason): Pt unaware that while performing peri care he was caught on his gown; unsuccessful at posterior peri care Functional mobility during ADLs: Minimal assistance General ADL Comments: Pt with decreased cognition,  balance, and safety  Cognition: Cognition Overall Cognitive Status: Difficult to assess Arousal/Alertness: Awake/alert Orientation Level: Oriented to person Attention: Sustained,Alternating Sustained Attention: Appears intact Alternating Attention: Appears intact Memory:  (UTA) Awareness: Impaired Awareness Impairment: Emergent impairment Problem Solving: Appears intact Safety/Judgment: Appears intact Cognition Arousal/Alertness: Awake/alert Behavior During Therapy: WFL for tasks assessed/performed Overall Cognitive Status: Difficult to assess Area of Impairment: Attention,Awareness,Problem solving,Following commands,Safety/judgement,Memory,Orientation Orientation Level: Disoriented to,Place,Situation Current Attention Level: Sustained,Selective Memory: Decreased short-term memory Following Commands: Follows one step commands inconsistently,Follows one step commands with increased time Safety/Judgement: Decreased awareness of safety,Decreased awareness of deficits Awareness: Intellectual Problem Solving: Slow processing,Requires verbal cues,Requires tactile cues General Comments: pt able to state name and hospital today but stated [place] and was unable to state date or choose correct date when given choices, pt with noted R sided inattention and remains easily distracted, hard time sequencing washing hands at sink, forgot to turn water off, unable to find paper towels on the R despite max verbal cues Difficult to assess due to: Impaired communication (aphasia)  Physical Exam: Blood pressure (!) 145/91, pulse 85, temperature 98.4 F (36.9 C), resp. rate 15, height 5' 9.5" (1.765 m), weight 112.9 kg, SpO2 96 %. Physical Exam Constitutional:      General: He is not in acute distress.    Appearance: He is obese. He is not ill-appearing.  HENT:     Head: Normocephalic and atraumatic.     Right Ear: External ear normal.     Left Ear: External ear normal.     Nose: Nose normal.   Eyes:     General:        Right eye: No discharge.  Left eye: No discharge.     Comments: Briefly opens eyes  Cardiovascular:     Comments: Irregularly irregular Pulmonary:     Effort: Pulmonary effort is normal. No respiratory distress.     Breath sounds: No stridor.  Abdominal:     General: Abdomen is flat. Bowel sounds are normal. There is no distension.  Musculoskeletal:     Cervical back: Normal range of motion and neck supple.     Comments: No edema or tenderness in extremities  Skin:    General: Skin is warm and dry.  Neurological:     Comments: Extremely lethargic, difficult to arouse Motor: Limited, however able to move all extremities spontaneously  Psychiatric:     Comments: Limited due to lethargy     Results for orders placed or performed during the hospital encounter of 07/15/20 (from the past 48 hour(s))  Glucose, capillary     Status: Abnormal   Collection Time: 07/16/20  8:26 AM  Result Value Ref Range   Glucose-Capillary 127 (H) 70 - 99 mg/dL    Comment: Glucose reference range applies only to samples taken after fasting for at least 8 hours.   Comment 1 Notify RN    Comment 2 Document in Chart   Glucose, capillary     Status: Abnormal   Collection Time: 07/16/20 11:50 AM  Result Value Ref Range   Glucose-Capillary 150 (H) 70 - 99 mg/dL    Comment: Glucose reference range applies only to samples taken after fasting for at least 8 hours.   Comment 1 Notify RN    Comment 2 Document in Chart   Glucose, capillary     Status: Abnormal   Collection Time: 07/16/20  4:51 PM  Result Value Ref Range   Glucose-Capillary 116 (H) 70 - 99 mg/dL    Comment: Glucose reference range applies only to samples taken after fasting for at least 8 hours.   Comment 1 Notify RN    Comment 2 Document in Chart   Glucose, capillary     Status: Abnormal   Collection Time: 07/16/20  9:02 PM  Result Value Ref Range   Glucose-Capillary 150 (H) 70 - 99 mg/dL    Comment:  Glucose reference range applies only to samples taken after fasting for at least 8 hours.  Glucose, capillary     Status: Abnormal   Collection Time: 07/17/20  6:03 AM  Result Value Ref Range   Glucose-Capillary 126 (H) 70 - 99 mg/dL    Comment: Glucose reference range applies only to samples taken after fasting for at least 8 hours.  Comprehensive metabolic panel     Status: Abnormal   Collection Time: 07/17/20  6:36 AM  Result Value Ref Range   Sodium 139 135 - 145 mmol/L   Potassium 3.8 3.5 - 5.1 mmol/L   Chloride 107 98 - 111 mmol/L   CO2 27 22 - 32 mmol/L   Glucose, Bld 133 (H) 70 - 99 mg/dL    Comment: Glucose reference range applies only to samples taken after fasting for at least 8 hours.   BUN 19 8 - 23 mg/dL   Creatinine, Ser 1.47 (H) 0.61 - 1.24 mg/dL   Calcium 9.0 8.9 - 10.3 mg/dL   Total Protein 5.9 (L) 6.5 - 8.1 g/dL   Albumin 3.0 (L) 3.5 - 5.0 g/dL   AST 17 15 - 41 U/L   ALT 13 0 - 44 U/L   Alkaline Phosphatase 54 38 - 126 U/L  Total Bilirubin 2.0 (H) 0.3 - 1.2 mg/dL   GFR, Estimated 48 (L) >60 mL/min    Comment: (NOTE) Calculated using the CKD-EPI Creatinine Equation (2021)    Anion gap 5 5 - 15    Comment: Performed at [facility] [address], [place], [place] [address]  Glucose, capillary     Status: Abnormal   Collection Time: 07/17/20 12:30 PM  Result Value Ref Range   Glucose-Capillary 162 (H) 70 - 99 mg/dL    Comment: Glucose reference range applies only to samples taken after fasting for at least 8 hours.  Glucose, capillary     Status: Abnormal   Collection Time: 07/17/20  4:21 PM  Result Value Ref Range   Glucose-Capillary 126 (H) 70 - 99 mg/dL    Comment: Glucose reference range applies only to samples taken after fasting for at least 8 hours.  Glucose, capillary     Status: Abnormal   Collection Time: 07/17/20  9:56 PM  Result Value Ref Range   Glucose-Capillary 158 (H) 70 - 99 mg/dL    Comment: Glucose reference range applies only to  samples taken after fasting for at least 8 hours.  Comprehensive metabolic panel     Status: Abnormal   Collection Time: 07/18/20  3:43 AM  Result Value Ref Range   Sodium 139 135 - 145 mmol/L   Potassium 4.0 3.5 - 5.1 mmol/L   Chloride 106 98 - 111 mmol/L   CO2 25 22 - 32 mmol/L   Glucose, Bld 171 (H) 70 - 99 mg/dL    Comment: Glucose reference range applies only to samples taken after fasting for at least 8 hours.   BUN 25 (H) 8 - 23 mg/dL   Creatinine, Ser 1.81 (H) 0.61 - 1.24 mg/dL   Calcium 9.1 8.9 - 10.3 mg/dL   Total Protein 5.9 (L) 6.5 - 8.1 g/dL   Albumin 3.0 (L) 3.5 - 5.0 g/dL   AST 18 15 - 41 U/L   ALT 13 0 - 44 U/L   Alkaline Phosphatase 53 38 - 126 U/L   Total Bilirubin 1.4 (H) 0.3 - 1.2 mg/dL   GFR, Estimated 38 (L) >60 mL/min    Comment: (NOTE) Calculated using the CKD-EPI Creatinine Equation (2021)    Anion gap 8 5 - 15    Comment: Performed at [facility], [place] [address], [place], [place] [address]  CBC     Status: Abnormal   Collection Time: 07/18/20  3:43 AM  Result Value Ref Range   WBC 12.0 (H) 4.0 - 10.5 K/uL   RBC 4.85 4.22 - 5.81 MIL/uL   Hemoglobin 14.9 13.0 - 17.0 g/dL   HCT 47.0 39.0 - 52.0 %   MCV 96.9 80.0 - 100.0 fL   MCH 30.7 26.0 - 34.0 pg   MCHC 31.7 30.0 - 36.0 g/dL   RDW 12.9 11.5 - 15.5 %   Platelets 215 150 - 400 K/uL   nRBC 0.0 0.0 - 0.2 %    Comment: Performed at [facility], [place] [address], [place], [place] [address]  Magnesium     Status: None   Collection Time: 07/18/20  3:43 AM  Result Value Ref Range   Magnesium 1.9 1.7 - 2.4 mg/dL    Comment: Performed at [facility] [address] [place], [place] [address]   CT HEAD WO CONTRAST  Result Date: 07/17/2020 CLINICAL DATA:  Follow-up CVA. EXAM: CT HEAD WITHOUT CONTRAST  TECHNIQUE: Contiguous axial images were obtained from the base of the skull through the vertex without intravenous contrast. COMPARISON:  Head CT 07/15/2020 and MRI 07/13/2020. FINDINGS:  Brain: Slight evolutionary change and the large left MCA territory infarct with more clearly defined area of low attenuation. No findings suspicious for acute hemorrhage. No new mass effect. No new infarcts are identified. Stable underlying age related cerebral atrophy and ventriculomegaly. Vascular: Stable vascular calcifications but no aneurysm or hyperdense vessels. Skull: No skull fracture or bone lesions. Sinuses/Orbits: Chronic right half sphenoid sinus disease. The paranasal sinuses and mastoid air cells are otherwise clear. The globes are intact. Other: No scalp lesions or scalp hematoma. IMPRESSION: 1. Slight evolutionary change in the large left MCA territory infarct. No findings suspicious for acute hemorrhage. No new infarcts. 2. Stable underlying age related cerebral atrophy and ventriculomegaly. 3. Chronic right half sphenoid sinus disease. Electronically Signed   By: Marijo Sanes M.D.   On: 07/17/2020 13:40       Medical Problem List and Plan: 1.  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  Antithrombotics: -DVT/anticoagulation: Awaiting plan transition to Eliquis  -antiplatelet therapy: Currently on aspirin 325 mg daily awaiting transition to Eliquis for stroke prevention 3. Pain Management: Tylenol as needed 4. Mood: Ativan 1 mg daily.  Provide emotional support  -antipsychotic agents: [name] 5. Neuropsych: This patient is capable of making decisions on his own behalf. 6. Skin/Wound Care: Routine skin checks 7. Fluids/Electrolytes/Nutrition: Routine in and outs  CMP ordered for tomorrow a.m. 8.  Hypertension  Monitor increase activity 9.  Hyperlipidemia.  Crestor/Colestid 10.  New onset atrial fibrillation.  Cardiac rate controlled.  Await plan to begin Eliquis  Monitor with increased  activity 11.  Prediabetes.  Hemoglobin A1c 6.6 2018 and 7.1 on this admission.  He was on no glucose lowering agents at home.  Currently SSI.  Await plan to possibly begin metformin.  Monitor increase mobility 12.  History of colon cancer with colectomy 2018.  Follow-up outpatient 13.  Obesity.  BMI 36.24.  Dietary follow-up 14.  AKI/CKD.  Creatinine 1.81.  Follow-up chemistries  CMP ordered for tomorrow a.m.   Lavon Paganini Angiulli, PA-C 07/18/2020  I have personally performed a face to face diagnostic evaluation, including, but not limited to relevant history and physical exam findings, of this patient and developed relevant assessment and plan.  Additionally, I have reviewed and concur with the physician assistant's documentation above.  Delice Lesch, MD, ABPMR  The patient's status has not changed. Any changes from the pre-admission screening or documentation from the acute chart are noted above.   Delice Lesch, MD, ABPMR

## 2020-07-19 NOTE — Evaluation (Signed)
Speech Language Pathology Assessment and Plan  Patient Details  Name: Edwin Wall MRN: [id number] Date of Birth: 02/04/1941  SLP Diagnosis: Aphasia;Cognitive Impairments  Rehab Potential: Good ELOS: 7-10 days    Today's Date: 07/19/2020 SLP Individual Time: 1330-1430 SLP Individual Time Calculation (min): 60 min   Hospital Problem: Principal Problem:   Left middle cerebral artery stroke [facility]) Active Problems:   Type II diabetes mellitus ([place])   New onset atrial fibrillation ([place])   Aphasia   Acute right hemiparesis ([place])  Past Medical History:  Past Medical History:  Diagnosis Date  . Anxiety   . Arthritis   . Cataract   . Colon cancer ([place]) dx'd 11/2016   "stage 3"  . Gout   . Gout   . Hemorrhoids   . History of kidney stones    "passed it"  . Hypertension   . Pre-diabetes    Past Surgical History:  Past Surgical History:  Procedure Laterality Date  . COLECTOMY  12/17/2016   lap; partial sigmoid colectomy/notes 12/17/2016  . COLONOSCOPY W/ BIOPSIES AND POLYPECTOMY  11/11/2016  . LAPAROSCOPIC SIGMOID COLECTOMY [name] 12/17/2016   Procedure: LAPAROSCOPIC SIGMOID COLECTOMY;  Surgeon: [name], MD;  Location: [facility];  Service: General;  Laterality: [name];  . NASAL SEPTUM SURGERY    . TONSILLECTOMY      Assessment / Plan / Recommendation  Edwin Wall present is a 80 year old right-handed male with history of prediabetes, hypertension, colon cancer with colectomy 2018, obesity with BMI 36.24, anxiety. History taken from chart review due to lethargy. Patient lives with spouse. Independent prior to admission. 1 level home 3 steps to entry. He presented on 07/15/2020 with acute right hemiparesis, dysarthria, and facial droop. 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 No acute hemorrhage. Patient did not receive tPA. CT angiogram of head and neck showed left M2 occlusion. MRI of the brain follow-up showed a  6.8 x 3.2 x 5.5 acute cortical subcortical infarct affecting the left frontal operculum, anterior lateral left frontal lobe and left insula. No evidence of hemorrhagic conversion no significant mass-effect. Admission chemistries unremarkable except glucose 155, creatinine 1.52, SARS coronavirus negative, hemoglobin A1c 7.1, TSH within normal limits. Hospital course further complicated by new onset atrial fibrillation with cardiac rate controlled. Echocardiogram with ejection fraction 50-55%, no wall motion abnormalities.. Currently maintained on aspirin 325 mg daily awaiting plan to possibly begin Eliquis 5/20 - 07/20/2020 for further stroke prevention holding off acutely due to large size of high risk infarct. Tolerating a regular consistency diet however there was reports of some dysphagia with pills as well as bouts of nausea vomiting and awaiting plan for follow-up swallow study versus esophagram that showed somewhat limited evaluation mild to moderate intermittent esophageal dysmotility otherwise unremarkable.. Therapy evaluations completed due to patient's right hemiparesis, decreased functional mobility was admitted for a comprehensive rehab program. Please see preadmission assessment from earlier today as well.    Patient transferred to CIR on 07/18/2020 .    Clinical Impression Patient presents with a mixed expressive-receptive aphasia with receptive language skills greater than expressive language skills. Patient able to name common objects and pictures with 75% accuracy when given exta time and improved in accuracy with phrase completion cues. He had significant difficulty with divergent naming, resulting in him exhibiting phonemic and semantic paraphasias without awareness, ie when naming animals: "dog, cat leopart, meeth (mice), lember, lamborghini". He was able to describe at phrase level, but inconsistently accurate, "she's fishing  over the creek", however also exhibiting semantic paraphasias,  "bicycle's green" (black and white drawing). He was able to demonstrate adequate left to right scanning when reading aloud paragraph level but appeared with some left neglect when SLP asking him who was sitting in room and he looking at pictures of MD's on wall instead of at his wife who was sitting in chair on left side or his bed. His wife also mentioned that patient has been impulsive when eating meals. (SLP plans to check in regarding patient's safety with PO intake although swallow function assessed to be [facility] by SLP when patient on acute care side of hospital. He followed one-step commands promptly and accurately but did seem confused when SLP asking him to point to the ceiling, and he would shrug and say "I dont know". Patient is exhibiting a moderate expressive aphasia, mild receptive aphasia, mild cognitive deficits and will require skilled ST services to maximize cognitive-linguistic abilities prior to discharge.  Skilled Therapeutic Interventions          Speech-Language Evaluation, cognitive evaluation  SLP Assessment  Patient will need skilled [facility] during CIR admission    Recommendations  Patient destination: Home Follow up Recommendations: Outpatient SLP ([place] if unable to have Outpatient SLP) Equipment Recommended: None recommended by SLP    SLP Frequency 3 to 5 out of 7 days   SLP Duration  SLP Intensity  SLP Treatment/Interventions 7-10 days  Minumum of 1-2 x/day, 30 to 90 minutes  Cognitive remediation/compensation;Speech/Language facilitation;Cueing hierarchy;Functional tasks;Patient/family education;Therapeutic Activities    Pain Pain Assessment Pain Scale: 0-10 Faces Pain Scale: No hurt  Prior Functioning Cognitive/Linguistic Baseline: Within functional limits Type of Home: House  Lives With: Spouse Available Help at Discharge: Family;Available 24 hours/day Vocation: Retired (Wife reported he worked in [employer] at a [employer] for  many years)  SLP Evaluation Cognition Overall Cognitive Status: Difficult to assess Arousal/Alertness: Awake/alert Orientation Level: Oriented to person;Disoriented to time;Disoriented to place Attention: Sustained;Selective Sustained Attention: Appears intact Selective Attention: Impaired Selective Attention Impairment: Verbal basic;Verbal complex Memory: Impaired Memory Impairment: Retrieval deficit Awareness: Impaired Awareness Impairment: Emergent impairment;Intellectual impairment Problem Solving: Impaired Problem Solving Impairment: Verbal complex;Verbal basic Behaviors: Impulsive Safety/Judgment: Impaired  Comprehension Auditory Comprehension Overall Auditory Comprehension: Impaired Commands: Impaired One Step Basic Commands: 50-74% accurate Two Step Basic Commands: 0-24% accurate Conversation: Simple Interfering Components: Attention;Processing speed EffectiveTechniques: Extra processing time;Repetition Visual Recognition/Discrimination Discrimination: Not tested Reading Comprehension Reading Status: Impaired Word level: Within functional limits Sentence Level: Impaired Paragraph Level: Not tested Expression Expression Primary Mode of Expression: Verbal Verbal Expression Overall Verbal Expression: Impaired Initiation: No impairment Automatic Speech: Name;Month of year;Social Response Level of Generative/Spontaneous Verbalization: Phrase Naming: Impairment Responsive: 26-50% accurate Confrontation: Impaired Convergent: Not tested Divergent: 25-49% accurate Verbal Errors: Semantic paraphasias;Not aware of errors;Phonemic paraphasias Effective Techniques: Semantic cues;Sentence completion;Written cues Non-Verbal Means of Communication: Not applicable Written Expression Dominant Hand: Left Written Expression: Not tested Oral Motor Oral Motor/Sensory Function Overall Oral Motor/Sensory Function: Within functional limits Motor Speech Overall Motor Speech:  Appears within functional limits for tasks assessed Respiration: Within functional limits Resonance: Within functional limits Articulation: Within functional limitis Intelligibility: Intelligible Motor Planning: Witnin functional limits  Care Tool Care Tool Cognition Expression of Ideas and Wants Expression of Ideas and Wants: Frequent difficulty - frequently exhibits difficulty with expressing needs and ideas   Understanding Verbal and Non-Verbal Content Understanding Verbal and Non-Verbal Content: Usually understands - understands most conversations, but misses some part/intent of message. Requires cues at  times to understand   Memory/Recall Ability *first 3 days only Memory/Recall Ability *first 3 days only: Current season;That he or she is in a hospital/hospital unit   Intelligibility: Intelligible   Short Term Goals: Week 1: SLP Short Term Goal 1 (Week 1): Patient will name at least 5 objects in category with min-modA cues. SLP Short Term Goal 2 (Week 1): Patient will describe object function for common objects, at phrase level with minA cues. SLP Short Term Goal 3 (Week 1): Patient will describe action pictures/photos at phrase level with minA cues. SLP Short Term Goal 4 (Week 1): Patient demonstrate awareness to errors when given modA cues during highly structured language tasks. SLP Short Term Goal 5 (Week 1): Patient will perform two-step commands/instructions with 80% accuracy and modA cues.  Refer to Care Plan for Long Term Goals  Recommendations for other services: None   Discharge Criteria: Patient will be discharged from SLP if patient refuses treatment 3 consecutive times without medical reason, if treatment goals not met, if there is a change in medical status, if patient makes no progress towards goals or if patient is discharged from hospital.  The above assessment, treatment plan, treatment alternatives and goals were discussed and mutually agreed upon: by patient  and by family  Sonia Baller, MA, CCC-SLP Speech Therapy

## 2020-07-19 NOTE — Progress Notes (Signed)
Patient information reviewed and entered into [facility] by Becky Terricka Onofrio, PPS coordinator. Information including medical coding, function ability, and quality indicators will be reviewed and updated through discharge.   

## 2020-07-19 NOTE — Progress Notes (Signed)
Pt alert and oriented except to time this am. Speech clear and more fluent this am. Provided education regarding farxiga (handout). Pt impulsive while sitting in recliner after am therapy, seatbelt alarm on and functioning, however pt undoing the belt itself, therefor alarm not sounding once he removes belt. Wife in room at the time and called out for assistance. discussed with NT and will use belt for something to figit with and seat pad for alarm.  Also u/a collected on the 18th, reviewed labs and a little dry, will keep iv and encourage fluids. Informed wife of plan.

## 2020-07-19 NOTE — Evaluation (Signed)
Physical Therapy Assessment and Plan  Patient Details  Name: Edwin Wall MRN: [id number] Date of Birth: 1940/07/09  PT Diagnosis: Abnormal posture, Abnormality of gait, Coordination disorder, Difficulty walking, Hemiplegia dominant, Impaired sensation and Muscle weakness Rehab Potential: Good ELOS: 7-10 days   Today's Date: 07/19/2020 PT Individual Time: 1000-1100 and 1300-1330 PT Individual Time Calculation (min): 60 min  And 30 mins  Hospital Problem: Principal Problem:   Left middle cerebral artery stroke [name]) Active Problems:   Type II diabetes mellitus ([place])   New onset atrial fibrillation (H[facility])   Aphasia   Acute right hemiparesis ([place])   Past Medical History:  Past Medical History:  Diagnosis Date  . Anxiety   . Arthritis   . Cataract   . Colon cancer ([place]) dx'd 11/2016   "stage 3"  . Gout   . Gout   . Hemorrhoids   . History of kidney stones    "passed it"  . Hypertension   . Pre-diabetes    Past Surgical History:  Past Surgical History:  Procedure Laterality Date  . COLECTOMY  12/17/2016   lap; partial sigmoid colectomy/notes 12/17/2016  . COLONOSCOPY W/ BIOPSIES AND POLYPECTOMY  11/11/2016  . LAPAROSCOPIC SIGMOID COLECTOMY [name] 12/17/2016   Procedure: LAPAROSCOPIC SIGMOID COLECTOMY;  Surgeon: [name], MD;  Location: [facility];  Service: General;  Laterality: [name];  . NASAL SEPTUM SURGERY    . TONSILLECTOMY      Assessment & Plan Clinical Impression: Patient is a 80 y.o. year old male  with history of prediabetes, hypertension, colon cancer with colectomy 2018, obesity with BMI 36.24, anxiety.  History taken from chart review due to lethargy. Patient lives with spouse.  Independent prior to admission.  1 level home 3 steps to entry.  He presented on 07/15/2020 with acute right hemiparesis, dysarthria, and facial droop.  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  No acute hemorrhage.  Patient did not  receive tPA.  CT angiogram of head and neck showed left M2 occlusion.  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  No evidence of hemorrhagic conversion no significant mass-effect.  Admission chemistries unremarkable except glucose 155, creatinine 1.52, SARS coronavirus negative, hemoglobin A1c 7.1, TSH within normal limits.  Hospital course further complicated by new onset atrial fibrillation with cardiac rate controlled.  Echocardiogram with ejection fraction 50-55%, no wall motion abnormalities..  Currently maintained on aspirin 325 mg daily awaiting plan to possibly begin Eliquis 5/20 - 07/20/2020 for further stroke prevention holding off acutely due to large size of high risk infarct.  Tolerating a regular consistency diet however there was reports of some dysphagia with pills as well as bouts of nausea vomiting and awaiting plan for follow-up swallow study versus esophagram that showed somewhat limited evaluation mild to moderate intermittent esophageal dysmotility otherwise unremarkable..  Therapy evaluations completed due to patient's right hemiparesis, decreased functional mobility was admitted for a comprehensive rehab program.  Please see preadmission assessment from earlier today as well. Patient transferred to CIR on 07/18/2020 .   Patient currently requires min with mobility secondary to muscle weakness, decreased cardiorespiratoy endurance, impaired timing and sequencing and decreased coordination, decreased visual perceptual skills and decreased standing balance, decreased postural control, hemiplegia and decreased balance strategies.  Prior to hospitalization, patient was independent  with mobility and lived with Spouse in a House  home.  Home access is 5 in front of home and 2 in back of homeStairs to enter.  Patient will benefit from skilled PT intervention to  maximize safe functional mobility, minimize fall risk and decrease caregiver burden for planned discharge home with 24 hour supervision.  Anticipate patient will benefit from follow up [place] at discharge.  PT - End of Session Activity Tolerance: Tolerates 30+ min activity with multiple rests Endurance Deficit: Yes PT Assessment Rehab Potential (ACUTE/IP ONLY): Good PT Barriers to Discharge: Inaccessible home environment;Decreased caregiver support;Incontinence;Home environment access/layout;Medication compliance PT Patient demonstrates impairments in the following area(s): Balance;Behavior;Perception;Safety;Sensory;Endurance;Skin Integrity;Nutrition PT Transfers Functional Problem(s): Bed Mobility;Bed to Chair;Car;Furniture PT Locomotion Functional Problem(s): Ambulation;Stairs;Wheelchair Mobility PT Plan PT Intensity: Minimum of 1-2 x/day ,45 to 90 minutes PT Frequency: 5 out of 7 days PT Duration Estimated Length of Stay: 7-10 days PT Treatment/Interventions: Ambulation/gait training;Balance/vestibular training;Community reintegration;Cognitive remediation/compensation;Disease management/prevention;Functional electrical stimulation;DME/adaptive equipment instruction;Discharge planning;Functional mobility training;Pain management;Splinting/orthotics;Psychosocial support;Neuromuscular re-education;Patient/family education;Skin care/wound management;Stair training;Therapeutic Exercise;UE/LE Coordination activities;Wheelchair propulsion/positioning;Visual/perceptual remediation/compensation;UE/LE Strength taining/ROM;Therapeutic Activities PT Transfers Anticipated Outcome(s): supervision PT Locomotion Anticipated Outcome(s): supervision PT Recommendation Follow Up Recommendations: Home health PT Patient destination: Home Equipment Recommended: To be determined   PT Evaluation Precautions/Restrictions Precautions Precautions: Fall Precaution Comments: R side inattention Restrictions Weight  Bearing Restrictions: No General   Vital Signs Pain Pain Assessment Pain Scale: 0-10 Pain Score: 0-No pain Home Living/Prior Functioning Home Living Available Help at Discharge: Family;Available 24 hours/day Type of Home: House Home Access: Stairs to enter [employer] of Steps: 5 in front of home and 2 in back of home Entrance Stairs-Rails: Right;Left;Can reach both (on front 5 steps. no rails on back steps) Home Layout: One level Bathroom Shower/Tub: Walk-in shower;Tub/shower unit Bathroom Toilet: Handicapped height Bathroom Accessibility: Yes  Lives With: Spouse Prior Function Level of Independence: Independent with basic ADLs;Independent with gait;Independent with transfers;Independent with homemaking with ambulation  Able to Take Stairs?: Yes Driving: Yes Vocation: Retired Comments: independent with ADLs, IADLs, and driving Vision/Perception  Vision - Assessment Eye Alignment: Within Functional Limits Ocular Range of Motion: Within Functional Limits Alignment/Gaze Preference: Gaze left Tracking/Visual Pursuits: Decreased smoothness of horizontal tracking;Requires cues, head turns, or add eye shifts to track Convergence: Impaired (comment) (R eye does not convergence) Perception Perception: Impaired Inattention/Neglect: Does not attend to right visual field (limited but can attend with cues) Praxis Praxis: Intact  Cognition Overall Cognitive Status: Difficult to assess Arousal/Alertness: Awake/alert Orientation Level: Oriented to person;Disoriented to time;Oriented to place Attention: Sustained;Alternating Sustained Attention: Appears intact Alternating Attention: Appears intact Memory: Impaired Memory Impairment: Retrieval deficit Immediate Memory Recall: Sock;Blue;Bed Memory Recall Sock: Without Cue Memory Recall Blue: With Cue Memory Recall Bed: Without Cue Awareness: Impaired Awareness Impairment: Intellectual impairment;Emergent impairment  ("nothing is different about me or wrong with me") Problem Solving: Impaired Problem Solving Impairment: Verbal complex;Functional complex Behaviors: Impulsive Safety/Judgment: Impaired Sensation Sensation Light Touch: Impaired by gross assessment (pt reported decreased sensation on R side compared to L) Hot/Cold: Appears Intact Proprioception: Impaired by gross assessment Stereognosis: Not tested Coordination Gross Motor Movements are Fluid and Coordinated: No Fine Motor Movements are Fluid and Coordinated: No Finger Nose Finger Test: able to complete with B hands but at a slower pace Motor  Motor Motor: Within Functional Limits   Trunk/Postural Assessment  Cervical Assessment Cervical Assessment: Within Functional Limits Thoracic Assessment Thoracic Assessment: Within Functional Limits Lumbar Assessment Lumbar Assessment: Exceptions to [facility] (posterior pelvic tilt) Postural Control Postural Control: Deficits on evaluation Protective Responses: decreased Postural Limitations: decreased  Balance Balance Balance Assessed: Yes Standardized Balance Assessment Standardized Balance Assessment: Timed Up and Go Test Timed Up and Go Test TUG: Normal TUG Normal TUG (seconds): 21 Static Sitting Balance Static Sitting - Balance Support: Feet supported;No upper extremity supported Static Sitting - Level of Assistance: 5: Stand by assistance Dynamic Sitting Balance Dynamic Sitting - Balance Support: Feet supported;No upper extremity supported Dynamic Sitting - Level of Assistance: 5: Stand by assistance Dynamic Sitting - Balance Activities: Lateral lean/weight shifting;Forward lean/weight shifting;Reaching across midline;Reaching for objects Static Standing Balance Static Standing - Balance Support: During functional activity;No upper extremity supported Static Standing - Level of Assistance: 4: Min assist Dynamic Standing Balance Dynamic Standing - Balance Support: During  functional activity;No upper extremity supported Dynamic Standing - Level of Assistance: 4: Min assist Dynamic Standing - Balance Activities: Lateral lean/weight shifting;Forward lean/weight shifting;Reaching for weighted objects Extremity Assessment  RUE Assessment RUE Assessment: Within Functional Limits LUE Assessment LUE Assessment: Within Functional Limits RLE Assessment RLE Assessment: Exceptions to [facility] RLE Strength Right Hip Flexion: 3/5 Right Hip ABduction: 3/5 Right Hip ADduction: 3/5 Right Knee Extension: 3+/5 Right Ankle Dorsiflexion: 3+/5 Right Ankle Plantar Flexion: 3+/5 LLE Assessment LLE Assessment: Exceptions to [facility] LLE Strength Left Hip Flexion: 4/5 Left Hip ABduction: 4/5 Left Hip ADduction: 4/5 Left Knee Extension: 4+/5 Left Ankle Dorsiflexion: 4+/5 Left Ankle Plantar Flexion: 4+/5  Care Tool Care Tool Bed Mobility Roll left and right activity   Roll left and right assist level: Minimal Assistance - Patient > 75%    Sit to lying activity   Sit to lying assist level: Minimal Assistance - Patient > 75%    Lying to sitting edge of bed activity   Lying to sitting edge of bed assist level: Minimal Assistance - Patient > 75%     Care Tool Transfers Sit to stand transfer   Sit to stand assist level: Minimal Assistance - Patient > 75%    Chair/bed transfer   Chair/bed transfer assist level: Minimal Assistance - Patient > 75%     Toilet transfer   Assist Level: Minimal Assistance - Patient > 75%    Car transfer   Car transfer assist level: Minimal Assistance - Patient > 75%      Care Tool Locomotion Ambulation   Assist level: Minimal Assistance - Patient > 75% Assistive device: No Device Max distance: 75  Walk 10 feet activity   Assist level: Minimal Assistance - Patient > 75% Assistive device: No Device   Walk 50 feet with 2 turns activity   Assist level: Minimal Assistance - Patient > 75% Assistive device: No Device  Walk 150 feet activity  Walk 150 feet activity did not occur: Safety/medical concerns      Walk 10 feet on uneven surfaces activity Walk 10 feet on uneven surfaces activity did not occur: Safety/medical concerns      Stairs   Assist level: Minimal Assistance - Patient > 75% Stairs assistive device: 2 hand rails Max number of stairs: 4  Walk up/down 1 step activity   Walk up/down 1 step (curb) assist level: Minimal Assistance - Patient > 75% Walk up/down 1 step or curb assistive device: 2 hand rails    Walk up/down 4 steps activity Walk up/down 4 steps assist level: Minimal Assistance - Patient > 75% Walk up/down 4 steps assistive device: 2 hand rails  Walk up/down 12 steps activity Walk up/down 12 steps activity did not occur: Safety/medical concerns      Pick up small objects from  floor   Pick up small object from the floor assist level: Minimal Assistance - Patient > 75% Pick up small object from the floor assistive device: none  Wheelchair Will patient use wheelchair at discharge?: No Type of Wheelchair: Manual   Wheelchair assist level: Moderate Assistance - Patient 50 - 74% Max wheelchair distance: 150  Wheel 50 feet with 2 turns activity   Assist Level: Moderate Assistance - Patient 50 - 74%  Wheel 150 feet activity   Assist Level: Moderate Assistance - Patient 50 - 74%    Refer to Care Plan for Long Term Goals  SHORT TERM GOAL WEEK 1 PT Short Term Goal 1 (Week 1): pt to demonstrate supine<>sit CGA PT Short Term Goal 2 (Week 1): pt to demonstrate functional transfers CGA PT Short Term Goal 3 (Week 1): pt to demonstrate gait 200' CGA with good awareness of Rt side  Recommendations for other services: None   Skilled Therapeutic Intervention   Evaluation completed (see details above and below) with education on PT POC and goals and individual treatment initiated with focus on  Bed mobility, transfer training, gait training, safety awareness, call light use,stair training, WC mobility. pt  received in bed and agreeable to therapy. Wife present throughout session. Pt demonstrated decreased awareness and/or attention to Rt side throughout session and required multiple and frequent VC and tactile cues for attention and safety to Rt side. Pt also slightly impulsive with mobility but able to be redirected. Pt directed in supine>sit from flat bed without rails min A. Pt directed in multiple Sit to stands throughout session min A grossly without AD. Pt directed in gait training without AD for 75' +75' min A for balance and safety. Pt directed in ascending/descending stairs with B handrails used x4 6" steps min A for safety with reciprocal steps ascending and step descending. Pt directed in 5xSTS with results below, pt completed this twice with need of BUE to complete and highly distractible during test both attempts which impacting scoring. Pt also directed in TUG x2 without AD 24s and 21s. Both of these timed results indicative of high fall risk. Pt and wife educated on this. Pt directed in retrieval of x2 items from ground level no AD min A. Pt directed in car transfer min A in/out of car at estimated height. Pt directed in ascending/descending  Ramp no AD min A and then WC mobility back to room mod A for 150' with assistance needed to redirect WC straight with noted path deviations to Rt side and multiple times to correct for attention to Rt side. Pt then directed in Stand pivot transfer to recliner no AD min A. Pt left in recliner, All needs in reach and in good condition. Call light in hand.  And alarm set. Wife and nursing present.    Five times Sit to Stand Test (FTSS) Method: Use a straight back chair with a solid seat that is 16-18" high. Ask participant to sit on the chair with arms folded across their chest.   Instructions: "Stand up and sit down as quickly as possible 5 times, keeping your arms folded across your chest."   Measurement: Stop timing when the participant stands the 5th  time.  TIME: 37 (in seconds) and 30s   Times > 13.6 seconds is associated with increased disability and morbidity (Guralnik, 2000) Times > 15 seconds is predictive of recurrent falls in healthy individuals aged 52 and older (Buatois, et al., 2008) Normal performance values in community dwelling individuals  aged 81 and older (Bohannon, 2006): o 60-69 years: 11.4 seconds o 70-79 years: 12.6 seconds o 80-89 years: 14.8 seconds  MCID: ? 2.3 seconds for Vestibular Disorders (Meretta, 2006)     SESSION 2: pt received in recliner and agreeable to therapy. Wife present. Pt reported feeling fatigued but denied pain. Per wife, pt had gotten up from recliner and ambulated in room, wife asked for help and staff assisted with returning to recliner. PT received pt attempting to slide under alarm belt. Pt educated on importance of safety awareness in room, call light use, when to use call light, need of assistance in room for all mobility. Pt's wife also educated on safety in room as well with verbalized understanding. Pt denied to attempt gait training 2/2 fatigue. Pt directed in step transfer to [facility] no AD min A and directed in WC mobility 200' mod A with VC and assistance for path deviations and attention to Rt side. Pt demonstrated moderate inattention to Rt with need of constant VC and tactile cues to turn head to Rt side and maneuver around obstacles. Pt returned to room and requested to return to bed, min A to complete. Pt left in bed, All needs in reach and in good condition. Call light in hand.  And alarm set. Wife present. Pt also demonstrated very aphasic speech pattern during session with pt speaking fairly clearly but nonsensical sentences.   Mobility Bed Mobility Bed Mobility: Rolling Right;Rolling Left;Supine to Sit;Sit to Supine;Scooting to [facility];Sitting - Scoot to [name] of Bed Rolling Right: Minimal Assistance - Patient > 75% Rolling Left: Minimal Assistance - Patient > 75% Supine to Sit: Minimal  Assistance - Patient > 75% Sitting - Scoot to Edge of Bed: Minimal Assistance - Patient > 75% Sit to Supine: Minimal Assistance - Patient > 75% Scooting to HOB: Minimal Assistance - Patient > 75% Transfers Transfers: Sit to Stand;Stand to Sit Sit to Stand: Minimal Assistance - Patient > 75% Stand to Sit: Minimal Assistance - Patient > 75% Transfer (Assistive device): None Locomotion  Gait Ambulation: Yes Gait Assistance: Minimal Assistance - Patient > 75% Gait Distance (Feet): 75 Feet Assistive device: None Gait Assistance Details: Verbal cues for precautions/safety;Verbal cues for gait pattern;Verbal cues for technique Gait Assistance Details: decreased awareness to R side Gait Gait: Yes Gait Pattern: Impaired Gait Pattern: Step-through pattern;Decreased step length - right;Poor foot clearance - right;Trunk flexed Gait velocity: decreased Stairs / Additional Locomotion Stairs: Yes Stairs Assistance: Minimal Assistance - Patient > 75% Stair Management Technique: Two rails Number of Stairs: 4 Height of Stairs: 6 Ramp: Minimal Assistance - Patient >75% Curb: Minimal Assistance - Patient >75% Wheelchair Mobility Wheelchair Mobility: Yes Wheelchair Assistance: Moderate Assistance - Patient 50 - 74% Wheelchair Propulsion: Both upper extremities Wheelchair Parts Management: Supervision/cueing;Needs assistance Distance: 150   Discharge Criteria: Patient will be discharged from PT if patient refuses treatment 3 consecutive times without medical reason, if treatment goals not met, if there is a change in medical status, if patient makes no progress towards goals or if patient is discharged from hospital.  The above assessment, treatment plan, treatment alternatives and goals were discussed and mutually agreed upon: by patient and by family  Junie Panning 07/19/2020, 2:13 PM

## 2020-07-20 ENCOUNTER — Other Ambulatory Visit (HOSPITAL_COMMUNITY): Payer: Self-pay

## 2020-07-20 LAB — GLUCOSE, CAPILLARY
Glucose-Capillary: 130 mg/dL — ABNORMAL HIGH (ref 70–99)
Glucose-Capillary: 142 mg/dL — ABNORMAL HIGH (ref 70–99)
Glucose-Capillary: 150 mg/dL — ABNORMAL HIGH (ref 70–99)
Glucose-Capillary: 174 mg/dL — ABNORMAL HIGH (ref 70–99)

## 2020-07-20 NOTE — Discharge Instructions (Addendum)
Inpatient Rehab Discharge Instructions  Patrick Discharge date and time: 08/03/20   Activities/Precautions/ Functional Status: Activity: activity as tolerated Diet: diabetic diet Wound Care: Routine skin checks   Functional status:  ___ No restrictions     ___ Walk up steps independently ___ 24/7 supervision/assistance   ___ Walk up steps with assistance ___ Intermittent supervision/assistance  ___ Bathe/dress independently ___ Walk with walker     _x__ Bathe/dress with assistance ___ Walk Independently    ___ Shower independently ___ Walk with assistance    ___ Shower with assistance ___ No alcohol     ___ Return to work/school ________  Special Instructions:  1. No driving smoking or alcohol    COMMUNITY REFERRALS UPON DISCHARGE:    Outpatient: PT,OT, SP             Agency:[facility] Phone:[phone]              Appointment Date/Time:WILL CALL WIFE TO SET UP APPOINTMENTS  Medical Equipment/Items Ordered: ROLLING WALKER & 3 IN 1                                                 Agency/Supplier:[facility]  [phone]     STROKE/TIA DISCHARGE INSTRUCTIONS SMOKING Cigarette smoking nearly doubles your risk of having a stroke & is the single most alterable risk factor  If you smoke or have smoked in the last 12 months, you are advised to quit smoking for your health.  Most of the excess cardiovascular risk related to smoking disappears within a year of stopping.  Ask you doctor about anti-smoking medications  [place] Quit Line: 1-800-QUIT NOW  Free Smoking Cessation Classes [phone]  CHOLESTEROL Know your levels; limit fat & cholesterol in your diet  Lipid Panel     Component Value Date/Time   CHOL 179 07/16/2020 0337   TRIG 92 07/16/2020 0337   HDL 34 (L) 07/16/2020 0337   CHOLHDL 5.3 07/16/2020 0337   VLDL 18 07/16/2020 0337   LDLCALC 127 (H) 07/16/2020 0337      Many patients benefit from treatment even if their  cholesterol is at goal.  Goal: Total Cholesterol (CHOL) less than 160  Goal:  Triglycerides (TRIG) less than 150  Goal:  HDL greater than 40  Goal:  LDL (LDLCALC) less than 100   BLOOD PRESSURE American Stroke Association blood pressure target is less that 120/80 mm/Hg  Your discharge blood pressure is:  BP: 134/76  Monitor your blood pressure  Limit your salt and alcohol intake  Many individuals will require more than one medication for high blood pressure  DIABETES (A1c is a blood sugar average for last 3 months) Goal HGBA1c is under 7% (HBGA1c is blood sugar average for last 3 months)  Diabetes:    Lab Results  Component Value Date   HGBA1C 7.1 (H) 07/16/2020     Your HGBA1c can be lowered with medications, healthy diet, and exercise.  Check your blood sugar as directed by your physician  Call your physician if you experience unexplained or low blood sugars.  PHYSICAL ACTIVITY/REHABILITATION Goal is 30 minutes at least 4 days per week  Activity: Increase activity slowly, Therapies: Physical Therapy: Home Health Return to work:  [name]  Activity decreases your risk of heart attack and stroke and makes your heart stronger.  It helps control  your weight and blood pressure; helps you relax and can improve your mood.  Participate in a regular exercise program.  Talk with your doctor about the best form of exercise for you (dancing, walking, swimming, cycling).  DIET/WEIGHT Goal is to maintain a healthy weight  Your discharge diet is:  Diet Order            DIET DYS 3 Room service appropriate? Yes with Assist; Fluid consistency: Thin  Diet effective now                 liquids Your height is:  5'9" Your current weight is: 229 lbs Your Body Mass Index (BMI) is:  33  Following the type of diet specifically designed for you will help prevent another stroke.  Your goal weight 171 lbs     Your goal Body Mass Index (BMI) is 19-24.  Healthy food habits can help reduce 3  risk factors for stroke:  High cholesterol, hypertension, and excess weight.  RESOURCES Stroke/Support Group:  Call [phone]   STROKE EDUCATION PROVIDED/REVIEWED AND GIVEN TO PATIENT Stroke warning signs and symptoms How to activate emergency medical system (call 911). Medications prescribed at discharge. Need for follow-up after discharge. Personal risk factors for stroke. Pneumonia vaccine given:  Flu vaccine given:  My questions have been answered, the writing is legible, and I understand these instructions.  I will adhere to these goals & educational materials that have been provided to me after my discharge from the hospital.     My questions have been answered and I understand these instructions. I will adhere to these goals and the provided educational materials after my discharge from the hospital.  Patient/Caregiver Signature _______________________________ Date __________  Clinician Signature _______________________________________ Date __________  Please bring this form and your medication list with you to all your follow-up doctor's appointments.   ==========================================================================  Information on my medicine - ELIQUIS (apixaban)  This medication education was reviewed with me or my healthcare representative as part of my discharge preparation.   Why was Eliquis prescribed for you? Eliquis was prescribed for you to reduce the risk of a blood clot forming that can cause a stroke if you have a medical condition called atrial fibrillation (a type of irregular heartbeat).  What do You need to know about Eliquis ? Take your Eliquis TWICE DAILY - one tablet in the morning and one tablet in the evening with or without food. If you have difficulty swallowing the tablet whole please discuss with your pharmacist how to take the medication safely.  Take Eliquis exactly as prescribed by your doctor and DO NOT stop taking Eliquis  without talking to the doctor who prescribed the medication.  Stopping may increase your risk of developing a stroke.  Refill your prescription before you run out.  After discharge, you should have regular check-up appointments with your healthcare provider that is prescribing your Eliquis.  In the future your dose may need to be changed if your kidney function or weight changes by a significant amount or as you get older.  What do you do if you miss a dose? If you miss a dose, take it as soon as you remember on the same day and resume taking twice daily.  Do not take more than one dose of ELIQUIS at the same time to make up a missed dose.  Important Safety Information A possible side effect of Eliquis is bleeding. You should call your healthcare provider right away if you experience any  of the following: ? Bleeding from an injury or your nose that does not stop. ? Unusual colored urine (red or dark brown) or unusual colored stools (red or black). ? Unusual bruising for unknown reasons. ? A serious fall or if you hit your head (even if there is no bleeding).  Some medicines may interact with Eliquis and might increase your risk of bleeding or clotting while on Eliquis. To help avoid this, consult your healthcare provider or pharmacist prior to using any new prescription or non-prescription medications, including herbals, vitamins, non-steroidal anti-inflammatory drugs (NSAIDs) and supplements.  This website has more information on Eliquis (apixaban): http://www.eliquis.com/eliquis/home  =====================================================  Atrial Fibrillation    Atrial fibrillation is a type of heartbeat that is irregular or fast. If you have this condition, your heart beats without any order. This makes it hard for your heart to pump blood in a normal way. Atrial fibrillation may come and go, or it may become a long-lasting problem. If this condition is not treated, it can put you at  higher risk for stroke, heart failure, and other heart problems.  What are the causes? This condition may be caused by diseases that damage the heart. They include:  High blood pressure.  Heart failure.  Heart valve disease.  Heart surgery. Other causes include:  Diabetes.  Thyroid disease.  Being overweight.  Kidney disease. Sometimes the cause is not known.  What increases the risk? You are more likely to develop this condition if:  You are older.  You smoke.  You exercise often and very hard.  You have a family history of this condition.  You are a man.  You use drugs.  You drink a lot of alcohol.  You have lung conditions, such as emphysema, pneumonia, or COPD.  You have sleep apnea.   What are the signs or symptoms? Common symptoms of this condition include:  A feeling that your heart is beating very fast.  Chest pain or discomfort.  Feeling short of breath.  Suddenly feeling light-headed or weak.  Getting tired easily during activity.  Fainting.  Sweating. In some cases, there are no symptoms.  How is this treated? Treatment for this condition depends on underlying conditions and how you feel when you have atrial fibrillation. They include: 1. Medicines to: ? Prevent blood clots. ? Treat heart rate or heart rhythm problems. 2. Using devices, such as a pacemaker, to correct heart rhythm problems. 3. Doing surgery to remove the part of the heart that sends bad signals. 4. Closing an area where clots can form in the heart (left atrial appendage). In some cases, your doctor will treat other underlying conditions.  Follow these instructions at home:  Medicines 1. Take over-the-counter and prescription medicines only as told by your doctor. 2. Do not take any new medicines without first talking to your doctor. 3. If you are taking blood thinners: ? Talk with your doctor before you take any medicines that have aspirin or NSAIDs, such as  ibuprofen, in them. ? Take your medicine exactly as told by your doctor. Take it at the same time each day. ? Avoid activities that could hurt or bruise you. Follow instructions about how to prevent falls. ? Wear a bracelet that says you are taking blood thinners. Or, carry a card that lists what medicines you take. Lifestyle          Do not use any products that have nicotine or tobacco in them. These include cigarettes, e-cigarettes, and chewing  tobacco. If you need help quitting, ask your doctor.  Eat heart-healthy foods. Talk with your doctor about the right eating plan for you.  Exercise regularly as told by your doctor.  Do not drink alcohol.  Lose weight if you are overweight.  Do not use drugs, including cannabis.  General instructions  If you have a condition that causes breathing to stop for a short period of time (apnea), treat it as told by your doctor.  Keep a healthy weight. Do not use diet pills unless your doctor says they are safe for you. Diet pills may make heart problems worse.  Keep all follow-up visits as told by your doctor. This is important.  Contact a doctor if:  You notice a change in the speed, rhythm, or strength of your heartbeat.  You are taking a blood-thinning medicine and you get more bruising.  You get tired more easily when you move or exercise.  You have a sudden change in weight.  Get help right away if:    1. You have pain in your chest or your belly (abdomen). 2. You have trouble breathing. 3. You have side effects of blood thinners, such as blood in your vomit, poop (stool), or pee (urine), or bleeding that cannot stop. 4. You have any signs of a stroke. "BE FAST" is an easy way to remember the main warning signs: ? B - Balance. Signs are dizziness, sudden trouble walking, or loss of balance. ? E - Eyes. Signs are trouble seeing or a change in how you see. ? F - Face. Signs are sudden weakness or loss of feeling in the face,  or the face or eyelid drooping on one side. ? A - Arms. Signs are weakness or loss of feeling in an arm. This happens suddenly and usually on one side of the body. ? S - Speech. Signs are sudden trouble speaking, slurred speech, or trouble understanding what people say. ? T - Time. Time to call emergency services. Write down what time symptoms started. 5. You have other signs of a stroke, such as: ? A sudden, very bad headache with no known cause. ? Feeling like you may vomit (nausea). ? Vomiting. ? A seizure.  These symptoms may be an emergency. Do not wait to see if the symptoms will go away. Get medical help right away. Call your local emergency services (911 in the [place] Do not drive yourself to the hospital. Summary  Atrial fibrillation is a type of heartbeat that is irregular or fast.  You are at higher risk of this condition if you smoke, are older, have diabetes, or are overweight.  Follow your doctor's instructions about medicines, diet, exercise, and follow-up visits.  Get help right away if you have signs or symptoms of a stroke.  Get help right away if you cannot catch your breath, or you have chest pain or discomfort. This information is not intended to replace advice given to you by your health care provider. Make sure you discuss any questions you have with your health care provider. Document Revised: 08/11/2018 Document Reviewed: 08/11/2018 Elsevier Patient Education  2020 [employer]   =============================================================  Diabetes Basics    Diabetes (diabetes mellitus) is a long-term (chronic) disease. It occurs when the body does not properly use sugar (glucose) that is released from food after you eat.  Diabetes may be caused by one or both of these problems:  Your pancreas does not make enough of a hormone called insulin.  Your  body does not react in a normal way to insulin that it makes. Insulin lets sugars (glucose) go into  cells in your body. This gives you energy. If you have diabetes, sugars cannot get into cells. This causes high blood sugar (hyperglycemia).  Follow these instructions at home: How is diabetes treated? You may need to take insulin or other diabetes medicines daily to keep your blood sugar in balance. Take your diabetes medicines every day as told by your doctor. List your diabetes medicines here:  Diabetes medicines 5. Name of medicine: ______________________________ ? Amount (dose): _______________ Time (a.m./p.m.): _______________ Notes: ___________________________________ 6. Name of medicine: ______________________________ ? Amount (dose): _______________ Time (a.m./p.m.): _______________ Notes: ___________________________________ 7. Name of medicine: ______________________________ ? Amount (dose): _______________ Time (a.m./p.m.): _______________ Notes: ___________________________________ If you use insulin, you will learn how to give yourself insulin by injection. You may need to adjust the amount based on the food that you eat. List the types of insulin you use here:  Insulin 4. Insulin type: ______________________________ ? Amount (dose): _______________ Time (a.m./p.m.): _______________ Notes: ___________________________________ 5. Insulin type: ______________________________ ? Amount (dose): _______________ Time (a.m./p.m.): _______________ Notes: ___________________________________ 6. Insulin type: ______________________________ ? Amount (dose): _______________ Time (a.m./p.m.): _______________ Notes: ___________________________________ 7. Insulin type: ______________________________ ? Amount (dose): _______________ Time (a.m./p.m.): _______________ Notes: ___________________________________ 8. Insulin type: ______________________________ ? Amount (dose): _______________ Time (a.m./p.m.): _______________ Notes: ___________________________________  How do I manage my blood  sugar?    Check your blood sugar levels using a blood glucose monitor as directed by your doctor. Your doctor will set treatment goals for you. Generally, you should have these blood sugar levels:  Before meals (preprandial): 80-130 mg/dL (6.8-0.3 mmol/L).  After meals (postprandial): below 180 mg/dL (10 mmol/L).  A1c level: less than 7%. Write down the times that you will check your blood sugar levels:  Blood sugar checks  Time: _______________ Notes: ___________________________________  Time: _______________ Notes: ___________________________________  Time: _______________ Notes: ___________________________________  Time: _______________ Notes: ___________________________________  Time: _______________ Notes: ___________________________________  Time: _______________ Notes: ___________________________________   What do I need to know about low blood sugar? Low blood sugar is called hypoglycemia. This is when blood sugar is at or below 70 mg/dL (3.9 mmol/L). Symptoms may include: 6. Feeling: ? Hungry. ? Worried or nervous (anxious). ? Sweaty and clammy. ? Confused. ? Dizzy. ? Sleepy. ? Sick to your stomach (nauseous). 7. Having: ? A fast heartbeat. ? A headache. ? A change in your vision. ? Tingling or no feeling (numbness) around the mouth, lips, or tongue. ? Jerky movements that you cannot control (seizure). 8. Having trouble with: ? Moving (coordination). ? Sleeping. ? Passing out (fainting). ? Getting upset easily (irritability).  Treating low blood sugar To treat low blood sugar, eat or drink something sugary right away. If you can think clearly and swallow safely, follow the 15:15 rule:  1. Take 15 grams of a fast-acting carb (carbohydrate). Talk with your doctor about how much you should take. 2. Some fast-acting carbs are: ? Sugar tablets (glucose pills). Take 3-4 glucose pills. ? 6-8 pieces of hard candy. ? 4-6 oz (120-150 mL) of fruit juice. ? 4-6  oz (120-150 mL) of regular (not diet) soda. ? 1 Tbsp (15 mL) honey or sugar.  3. Check your blood sugar 15 minutes after you take the carb. 4. If your blood sugar is still at or below 70 mg/dL (3.9 mmol/L), take 15 grams of a carb again. 5. If your blood sugar does not go above 70 mg/dL (  3.9 mmol/L) after 3 tries, get help right away. 6. After your blood sugar goes back to normal, eat a meal or a snack within 1 hour.  Treating very low blood sugar If your blood sugar is at or below 54 mg/dL (3 mmol/L), you have very low blood sugar (severe hypoglycemia). This is an emergency. Do not wait to see if the symptoms will go away. Get medical help right away. Call your local emergency services (911 in the [place] Do not drive yourself to the hospital. Questions to ask your health care provider  Do I need to meet with a diabetes educator?  What equipment will I need to care for myself at home?  What diabetes medicines do I need? When should I take them?  How often do I need to check my blood sugar?  What number can I call if I have questions?  When is my next doctor's visit?  Where can I find a support group for people with diabetes?  Where to find more information  American Diabetes Association: www.diabetes.org  American Association of Diabetes Educators: www.diabeteseducator.org/patient-resources  Contact a doctor if: 1. Your blood sugar is at or above 240 mg/dL (13.3 mmol/L) for 2 days in a row. 2. You have been sick or have had a fever for 2 days or more, and you are not getting better. 3. You have any of these problems for more than 6 hours: ? You cannot eat or drink. ? You feel sick to your stomach (nauseous). ? You throw up (vomit). ? You have watery poop (diarrhea).  Get help right away if: 1. Your blood sugar is lower than 54 mg/dL (3 mmol/L). 2. You get confused. 3. You have trouble: ? Thinking clearly. ? Breathing.  Summary  Diabetes (diabetes mellitus) is a  long-term (chronic) disease. It occurs when the body does not properly use sugar (glucose) that is released from food after digestion.  Take insulin and diabetes medicines as told.  Check your blood sugar every day, as often as told.  Keep all follow-up visits as told by your doctor. This is important. This information is not intended to replace advice given to you by your health care provider. Make sure you discuss any questions you have with your health care provider.  ======================================================================  Diabetes Mellitus and Nutrition, Adult When you have diabetes (diabetes mellitus), it is very important to have healthy eating habits because your blood sugar (glucose) levels are greatly affected by what you eat and drink. Eating healthy foods in the appropriate amounts, at about the same times every day, can help you:  Control your blood glucose.  Lower your risk of heart disease.  Improve your blood pressure.  Reach or maintain a healthy weight. Every person with diabetes is different, and each person has different needs for a meal plan. Your health care provider may recommend that you work with a diet and nutrition specialist (dietitian) to make a meal plan that is best for you. Your meal plan may vary depending on factors such as:  The calories you need.  The medicines you take.  Your weight.  Your blood glucose, blood pressure, and cholesterol levels.  Your activity level.  Other health conditions you have, such as heart or kidney disease.  How do carbohydrates affect me? Carbohydrates, also called carbs, affect your blood glucose level more than any other type of food. Eating carbs naturally raises the amount of glucose in your blood. Carb counting is a method for keeping track  of how many carbs you eat. Counting carbs is important to keep your blood glucose at a healthy level, especially if you use insulin or take certain oral diabetes  medicines. It is important to know how many carbs you can safely have in each meal. This is different for every person. Your dietitian can help you calculate how many carbs you should have at each meal and for each snack. Foods that contain carbs include:  Bread, cereal, rice, pasta, and crackers.  Potatoes and corn.  Peas, beans, and lentils.  Milk and yogurt.  Fruit and juice.  Desserts, such as cakes, cookies, ice cream, and candy.  How does alcohol affect me? Alcohol can cause a sudden decrease in blood glucose (hypoglycemia), especially if you use insulin or take certain oral diabetes medicines. Hypoglycemia can be a life-threatening condition. Symptoms of hypoglycemia (sleepiness, dizziness, and confusion) are similar to symptoms of having too much alcohol. If your health care provider says that alcohol is safe for you, follow these guidelines:  Limit alcohol intake to no more than 1 drink per day for nonpregnant women and 2 drinks per day for men. One drink equals 12 oz of beer, 5 oz of wine, or 1 oz of hard liquor.  Do not drink on an empty stomach.  Keep yourself hydrated with water, diet soda, or unsweetened iced tea.  Keep in mind that regular soda, juice, and other mixers may contain a lot of sugar and must be counted as carbs.  What are tips for following this plan?    Reading food labels  Start by checking the serving size on the "Nutrition Facts" label of packaged foods and drinks. The amount of calories, carbs, fats, and other nutrients listed on the label is based on one serving of the item. Many items contain more than one serving per package.  Check the total grams (g) of carbs in one serving. You can calculate the number of servings of carbs in one serving by dividing the total carbs by 15. For example, if a food has 30 g of total carbs, it would be equal to 2 servings of carbs.  Check the number of grams (g) of saturated and trans fats in one serving. Choose  foods that have low or no amount of these fats.  Check the number of milligrams (mg) of salt (sodium) in one serving. Most people should limit total sodium intake to less than 2,300 mg per day.  Always check the nutrition information of foods labeled as "low-fat" or "nonfat". These foods may be higher in added sugar or refined carbs and should be avoided.  Talk to your dietitian to identify your daily goals for nutrients listed on the label.  Shopping  Avoid buying canned, premade, or processed foods. These foods tend to be high in fat, sodium, and added sugar.  Shop around the outside edge of the grocery store. This includes fresh fruits and vegetables, bulk grains, fresh meats, and fresh dairy.  Cooking  Use low-heat cooking methods, such as baking, instead of high-heat cooking methods like deep frying.  Cook using healthy oils, such as olive, canola, or sunflower oil.  Avoid cooking with butter, cream, or high-fat meats.  Meal planning 8. Eat meals and snacks regularly, preferably at the same times every day. Avoid going long periods of time without eating. 9. Eat foods high in fiber, such as fresh fruits, vegetables, beans, and whole grains. Talk to your dietitian about how many servings of carbs you can  eat at each meal. 10. Eat 4-6 ounces (oz) of lean protein each day, such as lean meat, chicken, fish, eggs, or tofu. One oz of lean protein is equal to: ? 1 oz of meat, chicken, or fish. ? 1 egg. ?  cup of tofu. 11. Eat some foods each day that contain healthy fats, such as avocado, nuts, seeds, and fish.  Lifestyle 9. Check your blood glucose regularly. 10. Exercise regularly as told by your health care provider. This may include: ? 150 minutes of moderate-intensity or vigorous-intensity exercise each week. This could be brisk walking, biking, or water aerobics. ? Stretching and doing strength exercises, such as yoga or weightlifting, at least 2 times a week. 11. Take  medicines as told by your health care provider. 12. Do not use any products that contain nicotine or tobacco, such as cigarettes and e-cigarettes. If you need help quitting, ask your health care provider. 64. Work with a Social worker or diabetes educator to identify strategies to manage stress and any emotional and social challenges.  Questions to ask a health care provider  Do I need to meet with a diabetes educator?  Do I need to meet with a dietitian?  What number can I call if I have questions?  When are the best times to check my blood glucose?  Where to find more information:  American Diabetes Association: diabetes.org  Academy of Nutrition and Dietetics: www.eatright.[employer] of Diabetes and Digestive and Kidney Diseases (NIH): [employer]  Summary  A healthy meal plan will help you control your blood glucose and maintain a healthy lifestyle.  Working with a diet and nutrition specialist (dietitian) can help you make a meal plan that is best for you.  Keep in mind that carbohydrates (carbs) and alcohol have immediate effects on your blood glucose levels. It is important to count carbs and to use alcohol carefully. This information is not intended to replace advice given to you by your health care provider. Make sure you discuss any questions you have with your health care provider.

## 2020-07-20 NOTE — IPOC Note (Signed)
Overall Plan of Care [facility]) Patient Details Name: Edwin Wall MRN: [id number] DOB: October 01, 1940  Admitting Diagnosis: Left middle cerebral artery stroke [facility])  Hospital Problems: Principal Problem:   Left middle cerebral artery stroke [facility]) Active Problems:   Type II diabetes mellitus ([place])   New onset atrial fibrillation ([place])   Aphasia   Acute right hemiparesis ([place])     Functional Problem List: Nursing Behavior,Bladder,Bowel,Endurance,Medication Management,Safety  PT Balance,Behavior,Perception,Safety,Sensory,Endurance,Skin Integrity,Nutrition  OT Balance,Cognition,Motor,Perception,Sensory  SLP Cognition,Safety,Linguistic,Perception  TR         Basic ADL's: OT Grooming,Bathing,Dressing,Toileting     Advanced  ADL's: OT       Transfers: PT Bed Mobility,Bed to Chair,Car,Furniture  OT Toilet,Tub/Shower     Locomotion: PT Ambulation,Stairs,Wheelchair Mobility     Additional Impairments: OT None  SLP Communication comprehension,expression    TR      Anticipated Outcomes Item Anticipated Outcome  Self Feeding independent  Swallowing  mod I   Basic self-care  supervision  Toileting  supervision   Bathroom Transfers supervision  Bowel/Bladder  supervision  Transfers  supervision  Locomotion  supervision  Communication  supervision to minA basic-mild complex expression/comprehension  Cognition  supervision basic safety/awareness/problem solving  Pain  n/a  Safety/Judgment  supervision and no falls   Therapy Plan: PT Intensity: Minimum of 1-2 x/day ,45 to 90 minutes PT Frequency: 5 out of 7 days PT Duration Estimated Length of Stay: 7-10 days OT Intensity: Minimum of 1-2 x/day, 45 to 90 minutes OT Frequency: 5 out of 7 days OT Duration/Estimated Length of Stay: 7-10 days SLP Intensity: Minumum of 1-2 x/day, 30 to 90 minutes SLP Frequency: 3 to 5 out of 7 days SLP Duration/Estimated Length of Stay: 7-10 days   Due to the current state  of emergency, patients may not be receiving their 3-hours of Medicare-mandated therapy.   Team Interventions: Nursing Interventions Patient/Family Education,Bladder Management,Bowel Management,Disease Management/Prevention,Medication Management,Discharge Planning,Psychosocial Support  PT interventions Ambulation/gait training,Balance/vestibular training,Community reintegration,Cognitive remediation/compensation,Disease management/prevention,Functional electrical stimulation,DME/adaptive equipment instruction,Discharge planning,Functional mobility training,Pain management,Splinting/orthotics,Psychosocial support,Neuromuscular re-education,Patient/family education,Skin care/wound management,Stair training,Therapeutic Exercise,UE/LE Museum/gallery conservator propulsion/positioning,Visual/perceptual remediation/compensation,UE/LE Strength taining/ROM,Therapeutic Activities  OT Interventions Balance/vestibular training,Cognitive remediation/compensation,Discharge planning,DME/adaptive equipment instruction,Functional mobility training,Neuromuscular re-education,Pain management,Psychosocial support,Patient/family education,Self Care/advanced ADL retraining,Therapeutic Activities,Therapeutic Exercise,UE/LE Strength taining/ROM,UE/LE Coordination activities,Visual/perceptual remediation/compensation  SLP Interventions Cognitive remediation/compensation,Speech/Language facilitation,Cueing hierarchy,Functional tasks,Patient/family education,Therapeutic Activities  TR Interventions    SW/CM Interventions Discharge Planning,Psychosocial Support,Patient/Family Education   Barriers to Discharge MD  Medical stability, Home enviroment access/loayout, New diabetic, Incontinence, Neurogenic bowel and bladder, Lack of/limited family support and Weight  Nursing Decreased caregiver support,Home environment access/layout,Incontinence,Lack of/limited family support,Medication compliance,Behavior    PT Inaccessible  home environment,Decreased caregiver support,Incontinence,Home environment access/layout,Medication compliance    OT      SLP Other (comments) (no barriers identified related to ST)    SW Decreased caregiver support Wife is elderly and has own health issues   Team Discharge Planning: Destination: PT-Home ,OT- Home , SLP-Home Projected Follow-up: PT-Home health PT, OT-  Home health OT, SLP-Outpatient SLP (HH if unable to have Outpatient SLP) Projected Equipment Needs: PT-To be determined, OT- None recommended by OT, SLP-None recommended by SLP Equipment Details: PT- , OT-  Patient/family involved in discharge planning: PT- Patient,Family member/caregiver,  OT-Patient,Family member/caregiver, SLP-Patient,Family member/caregiver  MD ELOS: 7-10 days Medical Rehab Prognosis:  Good Assessment: Pt is a 80 yr old male with hx of stroke with aphasia, R hemiparesis and perseveration/apraxia- also new DM and 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    Goals- supervision  See Team Conference Notes for weekly updates to the plan of care

## 2020-07-20 NOTE — Progress Notes (Signed)
Occupational Therapy Session Note  Patient Details  Name: Edwin Wall MRN: [id number] Date of Birth: 30-Nov-1940  Today's Date: 07/20/2020 OT Individual Time: 1829-9371 OT Individual Time Calculation (min): 56 min   Short Term Goals: Week 1:  OT Short Term Goal 1 (Week 1): STGs = LTGs  Skilled Therapeutic Interventions/Progress Updates:    Pt greeted in the recliner, 2 NTs present as pt keeps attempting to get out of the recliner by himself and can slip the safety belt over his head. Pt agreeable to engage in BADLs today. He completed toileting (using standard toilet, +bladder void but bladder incontinence also on floor after lowering pants), bathing (sit<stand in shower), dressing (sit<stand from recliner and also standing near EOB), and oral care/hair combing (standing at sink) during session. All functional transfers were completed at ambulatory level without AD and CGA. Note that he needed step by step cues for sequencing in the shower, pt initially rinsed himself off for <1 minute and then turned the water off, stating he was done. Cues also for attending to ADL items placed on his Rt side. Min A to reach his feet and CGA for dynamic standing balance. Pt initiated self problem solving when attempting to don brief at max level of independence in standing. He slightly squatted to thread brief between legs, Mod A and instruction from OT at this time. Note that he was unable to break squatted position, lost balance backwards. When cued to turn and sit on bed, pt unable to. Therefore recliner was pulled behind him so pt could safely sit. When asked if his legs got tired, pt stated "oh yeah." Min A to thread LEs into pants, CGA to pull them over hips in standing. OT set up a chair for him to sit at the sink but pt initiated standing during oral care/grooming activity with no LOB, just Rt lean. When asked if he wanted to return to the recliner chair vs bed, pt stated "chair" but then initiated  returning to bed. Left him with all needs within reach and bed alarm set. Tx focus placed on functional transfers, ADL retraining, functional cognition, and Rt attention (clothing items also placed on his Rt side during dressing).    Therapy Documentation Precautions:  Precautions Precautions: Fall Precaution Comments: R side inattention Restrictions Weight Bearing Restrictions: No Pain: no s/s pain during tx   ADL: ADL Eating: Set up Grooming: Setup Upper Body Bathing: Setup Where Assessed-Upper Body Bathing: Shower Lower Body Bathing: Contact guard Where Assessed-Lower Body Bathing: Shower Upper Body Dressing: Supervision/safety Where Assessed-Upper Body Dressing: Edge of bed Lower Body Dressing: Contact guard Where Assessed-Lower Body Dressing: Edge of bed Toileting: Contact guard Where Assessed-Toileting: Glass blower/designer: Therapist, music Method: Counselling psychologist: Energy manager: Curator Method: Heritage manager: Grab bars,Transfer tub bench      Therapy/Group: Individual Therapy  Viktor Philipp A Randeep Biondolillo 07/20/2020, 12:49 PM

## 2020-07-20 NOTE — TOC Benefit Eligibility Note (Signed)
Patient Teacher, English as a foreign language completed.    The patient is currently admitted and upon discharge could be taking Eliquis 5 mg.  The current 30 day co-pay is, $47.00.   The patient is currently admitted and upon discharge could be taking Farxiga 10 mg.  The current 30 day co-pay is, $47.00.    The patient is insured through [place], [place] Patient Advocate Specialist [place] Team Direct Number: [phone]  Fax: [phone]

## 2020-07-20 NOTE — Progress Notes (Signed)
Patient ID: Edwin Wall, male   DOB: [birth year], 80 y.o.   MRN: [id number]  Met with patient, introduced myself, and explained my role in his care. Gave him additional educational information on diabetes management, HTN management, DASH diet, and strokes. Explained the rehab process, therapy process, scheduling process, and that team conferences are on Tuesdays. We will keep him and his spouse informed of his progress and notify them of his discharge date. I will continue to monitor his progress.  Dorthula Nettles, RN3, BSN, CBIS, [place], [facility], Inpatient Rehabilitation Office [phone] Cell [phone]

## 2020-07-20 NOTE — Progress Notes (Signed)
Physical Therapy Session Note  Patient Details  Name: Edwin Wall MRN: [id number] Date of Birth: [birth year]  Today's Date: 07/20/2020 PT Individual Time: 0800-0900 and 1330-1445 PT Individual Time Calculation (min): 60 min and 75 mins  Short Term Goals: Week 1:  PT Short Term Goal 1 (Week 1): pt to demonstrate supine<>sit CGA PT Short Term Goal 2 (Week 1): pt to demonstrate functional transfers CGA PT Short Term Goal 3 (Week 1): pt to demonstrate gait 200' CGA with good awareness of Rt side  Skilled Therapeutic Interventions/Progress Updates:     pt received in recliner and agreeable to therapy. Pt directed in Sit to stand no AD CGA, donned pants in sitting and pulled over hips in standing. Pt then directed in Stand pivot transfer to WC min A and taken to ortho gym total A for time. Pt directed in dynamic standing at [facility] for x2 activities: alphabet sequencing and object identification with emphasis on use of RUE and attention to Rt side as well as sequencing for tasks and attention to task.  Pt was 50% accurate and average of 42.5 sec reaction time with alphabet activity and missed 7 bells on object identification activity. Pt also required max VC 80% of the time for letter task with attention to task, scanning to Rt side, use of RUE, and to recall sequencing of letters. Pt then directed in lateral stepping to R and L for 30' CGA with use of handrail intermittently, frequent VC for technique and attention to task. Pt then directed in backward gait 30' x2 no AD min A for safety with frequent VC for increased step length, attention to task. Pt then directed in gait no AD to room, min A for safety with VC for gait pattern and posture. Pt transferred to recliner, [place] present. Pt left in recliner, All needs in reach and in good condition. Call light in hand.  And alarm set. PT asked pt which button to push for assistance or to get up and pt reported, "I don't know". Pt shown call light and  pointed to button then asked again and pt able to recall.   Pt demonstrated very distractible behavior throughout session, poor safety awareness, poor insight to deficits, poor sequencing with tasks, decreased attention to task, and frustrated at his difficulty with tasks.   Session 2: pt received in bed and agreeable to therapy. Wife and daughter present. Pt directed in supine>sit CGA, Sit to stand no AD CGA. Pt directed in transfer to [facility] CGA and taken to gym total A for time and energy. Pt directed in dynamic standing balance with one UE support at [facility] [place] for sequencing tasks with letter placement, mod VC to complete and greatly extra time. Pt directed in gait training without AD with item retrieval on Rt side at min A for stability and VC for attention to Rt side 3x10. Pt required x2 attempts on 2/3 sets to attend to items on Rt side to initiate item retrieval. Pt directed in agility ladder walking with emphasis being on increased step length on Rt LE with max VC and visual target to complete. Pt directed in gait training without AD 300' +150' CGA with VC for increased arm swing, trunk rotation, increased RLE step length. Pt returned to room, requested to return to bed, CGA to complete. Pt left in bed, All needs in reach and in good condition. Call light in hand.  And alarm set. Wife and daughter present.   Therapy Documentation Precautions:  Precautions Precautions:  Fall Precaution Comments: R side inattention Restrictions Weight Bearing Restrictions: No General:   Vital Signs:   Pain:   Mobility:   Locomotion :    Trunk/Postural Assessment :    Balance:   Exercises:   Other Treatments:      Therapy/Group: Individual Therapy  Junie Panning 07/20/2020, 11:35 AM

## 2020-07-20 NOTE — Progress Notes (Signed)
PROGRESS NOTE   Subjective/Complaints:   Pt reports feeling "better"- denies pain, bowels OK-  Therapy went "pretty well".  Wants IV removed.  Ate 100% of breakfast tray   ROS: Limited by cognition/aphasia  Objective:   No results found. Recent Labs    07/18/20 0343 07/19/20 0521  WBC 12.0* 9.9  HGB 14.9 14.2  HCT 47.0 44.1  PLT 215 190   Recent Labs    07/18/20 0343 07/19/20 0521  NA 139 139  K 4.0 3.9  CL 106 105  CO2 25 25  GLUCOSE 171* 133*  BUN 25* 24*  CREATININE 1.81* 1.68*  CALCIUM 9.1 9.2    Intake/Output Summary (Last 24 hours) at 07/20/2020 1602 Last data filed at 07/20/2020 1300 Gross per 24 hour  Intake 438 ml  Output 225 ml  Net 213 ml        Physical Exam: Vital Signs Blood pressure 129/82, pulse 77, temperature 98.7 F (37.1 C), temperature source Oral, resp. rate 20, SpO2 97 %.    General: awake, alert, appropriate, sitting up in bed;  NAD HENT: conjugate gaze; oropharynx moist CV: irregular rhythm; bradycardic rate; no JVD Pulmonary: CTA B/L; no W/R/R- good air movement GI: soft, NT, ND, (+)BS Psychiatric: appropriate Neurological: aphasic, alert, perseverates  Musculoskeletal:     Cervical back: Normal range of motion and neck supple.     Comments: No edema or tenderness in extremities  Skin:    General: Skin is warm and dry.  Motor: Limited, however able to move all extremities spontaneously - didn't want to participate in MS exam     Assessment/Plan: 1. Functional deficits which require 3+ hours per day of interdisciplinary therapy in a comprehensive inpatient rehab setting.  Physiatrist is providing close team supervision and 24 hour management of active medical problems listed below.  Physiatrist and rehab team continue to assess barriers to discharge/monitor patient progress toward functional and medical goals  Care Tool:  Bathing    Body parts bathed  by patient: Right arm,Left arm,Chest,Abdomen,Front perineal area,Buttocks,Left upper leg,Right upper leg,Face     Body parts n/a: Left lower leg,Right lower leg   Bathing assist Assist Level: Minimal Assistance - Patient > 75%     Upper Body Dressing/Undressing Upper body dressing   What is the patient wearing?: Pull over shirt    Upper body assist Assist Level: Contact Guard/Touching assist (standing)    Lower Body Dressing/Undressing Lower body dressing      What is the patient wearing?: Pants,Incontinence brief     Lower body assist Assist for lower body dressing: Moderate Assistance - Patient 50 - 74%     Toileting Toileting    Toileting assist Assist for toileting: Contact Guard/Touching assist     Transfers Chair/bed transfer  Transfers assist     Chair/bed transfer assist level: Contact Guard/Touching assist     Locomotion Ambulation   Ambulation assist      Assist level: Minimal Assistance - Patient > 75% Assistive device: No Device Max distance: 75   Walk 10 feet activity   Assist     Assist level: Minimal Assistance - Patient > 75% Assistive device: No Device  Walk 50 feet activity   Assist    Assist level: Minimal Assistance - Patient > 75% Assistive device: No Device    Walk 150 feet activity   Assist Walk 150 feet activity did not occur: Safety/medical concerns         Walk 10 feet on uneven surface  activity   Assist Walk 10 feet on uneven surfaces activity did not occur: Safety/medical concerns         Wheelchair     Assist Will patient use wheelchair at discharge?: Yes (Per PT long term goals) Type of Wheelchair: Manual    Wheelchair assist level: Moderate Assistance - Patient 50 - 74% Max wheelchair distance: 150    Wheelchair 50 feet with 2 turns activity    Assist        Assist Level: Moderate Assistance - Patient 50 - 74%   Wheelchair 150 feet activity     Assist      Assist  Level: Moderate Assistance - Patient 50 - 74%   Blood pressure 129/82, pulse 77, temperature 98.7 F (37.1 C), temperature source Oral, resp. rate 20, SpO2 97 %.  Medical Problem List and Plan: 1.  Right side weakness with aphasia secondary to left MCA infarction with M2 occlusion likely from embolic source in the setting of new atrial fibrillation             -patient may shower             -ELOS/Goals: 8-12 days/Supervision             Admit to CIR  - first day of evaluations/PT, OT and SLP- of note, pt did refuse to do therapy initially-   -con't PT, OT and SLP- doing better- will remove IV 2.  Antithrombotics: -DVT/anticoagulation: Awaiting plan transition to Eliquis             -antiplatelet therapy: Currently on aspirin 325 mg daily awaiting transition to Eliquis for stroke prevention 3. Pain Management: Tylenol as needed  5/19- pt denied pain- con't tylenol prn 4. Mood: Ativan 1 mg daily.  Provide emotional support  5/20- stable- con't regimen;              -antipsychotic agents: [name] 5. Neuropsych: This patient is? capable of making decisions on his own behalf. 6. Skin/Wound Care: Routine skin checks 7. Fluids/Electrolytes/Nutrition: Routine in and outs             CMP ordered for tomorrow a.m. 8.  Hypertension             Monitor increase activity 9.  Hyperlipidemia.  Crestor/Colestid 10.  New onset atrial fibrillation.  Cardiac rate controlled.  Await plan to begin Eliquis             Monitor with increased activity 11.  Prediabetes.  Hemoglobin A1c 6.6 2018 and 7.1 on this admission.  He was on no glucose lowering agents at home.  Currently SSI.  Await plan to possibly begin metformin.  5/19- will call pharmacy about meds since don't want to start Metformin with his current GFR  5/20- started Farxiga 5mg  daily per pharmacy -BG's slightly better, but should take a few days to kick in.                Monitor increase mobility 12.  History of colon cancer with colectomy 2018.   Follow-up outpatient 13.  Obesity.  BMI 36.24.  Dietary follow-up 14.  AKI/CKD.  Creatinine 1.81.  Follow-up chemistries  5/19- Cr slightly less at 1.68- and BUN 24- GFR 41- will not start metformin due to GFR- will call phamacy med recs  5/20- added Farxiga 5mg  daily- will recheck labs weekly. Starting monday    LOS: 2 days A FACE TO FACE EVALUATION WAS PERFORMED  Chanita Boden 07/20/2020, 4:02 PM

## 2020-07-21 DIAGNOSIS — G8191 Hemiplegia, unspecified affecting right dominant side: Secondary | ICD-10-CM

## 2020-07-21 DIAGNOSIS — E1149 Type 2 diabetes mellitus with other diabetic neurological complication: Secondary | ICD-10-CM

## 2020-07-21 DIAGNOSIS — R4701 Aphasia: Secondary | ICD-10-CM

## 2020-07-21 DIAGNOSIS — I4891 Unspecified atrial fibrillation: Secondary | ICD-10-CM

## 2020-07-21 LAB — GLUCOSE, CAPILLARY
Glucose-Capillary: 128 mg/dL — ABNORMAL HIGH (ref 70–99)
Glucose-Capillary: 159 mg/dL — ABNORMAL HIGH (ref 70–99)
Glucose-Capillary: 134 mg/dL — ABNORMAL HIGH (ref 70–99)
Glucose-Capillary: 176 mg/dL — ABNORMAL HIGH (ref 70–99)

## 2020-07-21 NOTE — Plan of Care (Signed)
  Problem: Consults Goal: RH STROKE PATIENT EDUCATION Description: See Patient Education module for education specifics  Outcome: Progressing Goal: Diabetes Guidelines if Diabetic/Glucose > 140 Description: If diabetic or lab glucose is > 140 mg/dl - Initiate Diabetes/Hyperglycemia Guidelines & Document Interventions  Outcome: Progressing   Problem: RH BLADDER ELIMINATION Goal: RH STG MANAGE BLADDER WITH ASSISTANCE Description: STG Manage Bladder With Mod I Assistance Outcome: Progressing Goal: RH STG MANAGE BLADDER WITH MEDICATION WITH ASSISTANCE Description: STG Manage Bladder With Medication With mod I Assistance. Outcome: Progressing   Problem: RH SAFETY Goal: RH STG ADHERE TO SAFETY PRECAUTIONS W/ASSISTANCE/DEVICE Description: STG Adhere to Safety Precautions With Mod I Assistance/Device. Outcome: Progressing   Problem: RH KNOWLEDGE DEFICIT Goal: RH STG INCREASE KNOWLEDGE OF DIABETES Description: Patient will demonstrate knowledge of diabetes medication management, dietary management, insulin management with educational handouts and materials provided by staff, at discharge independently. Outcome: Progressing Goal: RH STG INCREASE KNOWLEDGE OF STROKE PROPHYLAXIS Description: Patient will be able to demonstrate knowledge of medications used to prevent future stroke with education materials and handouts provided by staff, at discharge independently. Outcome: Progressing   Problem: RH BOWEL ELIMINATION Goal: RH STG MANAGE BOWEL WITH ASSISTANCE Description: STG Manage Bowel with Mod I Assistance. Outcome: Not Progressing Goal: RH STG MANAGE BOWEL W/MEDICATION W/ASSISTANCE Description: STG Manage Bowel with Medication with Mod I Assistance. Outcome: Not Progressing

## 2020-07-21 NOTE — Progress Notes (Signed)
Occupational Therapy Session Note  Patient Details  Name: Edwin Wall MRN: [id number] Date of Birth: May 26, 1940  Today's Date: 07/21/2020 OT Individual Time: 1250-1346 OT Individual Time Calculation (min): 56 min   Short Term Goals: Week 1:  OT Short Term Goal 1 (Week 1): STGs = LTGs  Skilled Therapeutic Interventions/Progress Updates:    Pt greeted in bed with no s/s pain. Declining shower, and initially declining to participate in therapy altogether. OT then found some activities in Wall bag that family brought. He refused to sit EOB so set pt up with word scramble and maze activity while reclined in bed, pt agreeable to this. Therapeutic focus was placed on Rt attention and also verbal expression during 1st portion of session. Pt able to spell out correct words of word scramble but unable to verbalize words without seeing the correct answer or hearing OT say them. Unable to ascertain correct answers with phonetic cues alone. Min cues for Rt attention when solving maze puzzles, pt turning pages with the Rt hand at times but using his pen with the Lt hand. Afterwards pt was agreeable to ambulate, session focus then placed on dynamic standing balance. Noted increased difficultly with motor planning sitting up, posterior LOBs in unsupported sitting and he needed Mod Wall + vcs to reach EOB. Increased time/assistance required with motor planning sit<stand with RW as well. Once he stood with CGA, pt able to ambulate with CGA using the RW, max verbal and tactile cues for taking Rt turns when cued to do so. Noted that his pace was slowing and he appeared tired, instructed him to sit down in the w/c. When pt was cued to stand and walk back to the room, he became very externally distracted by people/signs in the environment. He self propelled w/c back to the room with Mod Wall due to time constraints, he tended to veer towards the Rt. Unable to locate his room independently. Stand pivot<bed completed without  device and CGA. Pt returned to bed and was left comfortably with all needs within reach and bed alarm set.   Therapy Documentation Precautions:  Precautions Precautions: Fall Precaution Comments: R side inattention Restrictions Weight Bearing Restrictions: No Vital Signs: Therapy Vitals Temp: 99.1 F (37.3 C) Pulse Rate: 88 Resp: 15 BP: 129/81 Patient Position (if appropriate): Lying Oxygen Therapy SpO2: 96 % O2 Device: Room Air ADL: ADL Eating: Set up Grooming: Setup Upper Body Bathing: Setup Where Assessed-Upper Body Bathing: Shower Lower Body Bathing: Contact guard Where Assessed-Lower Body Bathing: Shower Upper Body Dressing: Supervision/safety Where Assessed-Upper Body Dressing: Edge of bed Lower Body Dressing: Contact guard Where Assessed-Lower Body Dressing: Edge of bed Toileting: Contact guard Where Assessed-Toileting: Glass blower/designer: Therapist, music Method: Counselling psychologist: Energy manager: Curator Method: Heritage manager: Grab bars,Transfer tub bench     Therapy/Group: Individual Therapy  Edwin Wall Edwin Wall 07/21/2020, 3:17 PM

## 2020-07-21 NOTE — Progress Notes (Signed)
PROGRESS NOTE   Subjective/Complaints: Patient seen sitting up in bed this morning.  He states he slept well overnight.  He denies complaints.  No reported issues overnight.  ROS: Appears to deny CP, shortness of breath, nausea, vomiting, diarrhea.  Objective:   No results found. Recent Labs    07/19/20 0521  WBC 9.9  HGB 14.2  HCT 44.1  PLT 190   Recent Labs    07/19/20 0521  NA 139  K 3.9  CL 105  CO2 25  GLUCOSE 133*  BUN 24*  CREATININE 1.68*  CALCIUM 9.2    Intake/Output Summary (Last 24 hours) at 07/21/2020 0831 Last data filed at 07/21/2020 0757 Gross per 24 hour  Intake 360 ml  Output 650 ml  Net -290 ml        Physical Exam: Vital Signs Blood pressure (!) 124/93, pulse 68, temperature 98.2 F (36.8 C), resp. rate 20, SpO2 97 %. Constitutional: No distress . Vital signs reviewed. HENT: Normocephalic.  Atraumatic. Eyes: EOMI. No discharge. Cardiovascular: No JVD.  Irregularly irregular.Marland Kitchen Respiratory: Normal effort.  No stridor.  Bilateral clear to auscultation. GI: Non-distended.  BS +. Skin: Warm and dry.  Intact. Psych: Relatively normal mood and normal behavior. Musc: No edema in extremities.  No tenderness in extremities. Neuro: Alert Aphasia Motor exam remains somewhat limited due to participation, however >/4/5 throughout  Assessment/Plan: 1. Functional deficits which require 3+ hours per day of interdisciplinary therapy in a comprehensive inpatient rehab setting.  Physiatrist is providing close team supervision and 24 hour management of active medical problems listed below.  Physiatrist and rehab team continue to assess barriers to discharge/monitor patient progress toward functional and medical goals  Care Tool:  Bathing    Body parts bathed by patient: Right arm,Left arm,Chest,Abdomen,Front perineal area,Buttocks,Left upper leg,Right upper leg,Face     Body parts n/a: Left  lower leg,Right lower leg   Bathing assist Assist Level: Minimal Assistance - Patient > 75%     Upper Body Dressing/Undressing Upper body dressing   What is the patient wearing?: Pull over shirt    Upper body assist Assist Level: Contact Guard/Touching assist (standing)    Lower Body Dressing/Undressing Lower body dressing      What is the patient wearing?: Pants,Incontinence brief     Lower body assist Assist for lower body dressing: Moderate Assistance - Patient 50 - 74%     Toileting Toileting    Toileting assist Assist for toileting: Maximal Assistance - Patient 25 - 49%     Transfers Chair/bed transfer  Transfers assist     Chair/bed transfer assist level: Contact Guard/Touching assist     Locomotion Ambulation   Ambulation assist      Assist level: Minimal Assistance - Patient > 75% Assistive device: No Device Max distance: 75   Walk 10 feet activity   Assist     Assist level: Minimal Assistance - Patient > 75% Assistive device: No Device   Walk 50 feet activity   Assist    Assist level: Minimal Assistance - Patient > 75% Assistive device: No Device    Walk 150 feet activity   Assist Walk 150 feet activity did  not occur: Safety/medical concerns         Walk 10 feet on uneven surface  activity   Assist Walk 10 feet on uneven surfaces activity did not occur: Safety/medical concerns         Wheelchair     Assist Will patient use wheelchair at discharge?: Yes (Per PT long term goals) Type of Wheelchair: Manual    Wheelchair assist level: Moderate Assistance - Patient 50 - 74% Max wheelchair distance: 150    Wheelchair 50 feet with 2 turns activity    Assist        Assist Level: Moderate Assistance - Patient 50 - 74%   Wheelchair 150 feet activity     Assist      Assist Level: Moderate Assistance - Patient 50 - 74%   Blood pressure (!) 124/93, pulse 68, temperature 98.2 F (36.8 C), resp. rate  20, SpO2 97 %.  Medical Problem List and Plan: 1.  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  Antithrombotics: -DVT/anticoagulation: Awaiting plan transition to Eliquis             -antiplatelet therapy: Currently on aspirin 325 mg daily awaiting transition to Eliquis for stroke prevention 3. Pain Management: Tylenol as needed  Controlled on 5/21 4. Mood: Ativan 1 mg daily.  Provide emotional support             -antipsychotic agents: [name] 5. Neuropsych: This patient is? capable of making decisions on his own behalf. 6. Skin/Wound Care: Routine skin checks 7. Fluids/Electrolytes/Nutrition: Routine in and outs 8.  Hypertension  Relatively controlled on 5/21             Monitor increase activity 9.  Hyperlipidemia.  Crestor/Colestid 10.  New onset atrial fibrillation.  Cardiac rate controlled.  Await plan to begin Eliquis  Rate controlled on 5/21             Monitor with increased activity 11.  Diabetes mellitus type 2 with hyperglycemia.  Hemoglobin A1c 6.6 2018 and 7.1 on this admission.  He was on no glucose lowering agents at home.  Currently SSI.  Await plan to possibly begin metformin.  Started Farxiga 5mg  daily per pharmacy   Remains elevated on 5/21, would not make any changes today given recent addition of medication             Monitor increase mobility 12.  History of colon cancer with colectomy 2018.  Follow-up outpatient 13.  Obesity.  BMI 36.24.  Dietary follow-up 14.  AKI/CKD.    Creatinine 1.68 on 5/19, labs ordered for Monday  Continue to monitor   LOS: 3 days A FACE TO FACE EVALUATION WAS PERFORMED  Dezeray Puccio Lorie Phenix 07/21/2020, 8:31 AM

## 2020-07-22 ENCOUNTER — Inpatient Hospital Stay (HOSPITAL_COMMUNITY): Payer: Medicare Other

## 2020-07-22 DIAGNOSIS — D72829 Elevated white blood cell count, unspecified: Secondary | ICD-10-CM

## 2020-07-22 DIAGNOSIS — R400 Somnolence: Secondary | ICD-10-CM

## 2020-07-22 DIAGNOSIS — N179 Acute kidney failure, unspecified: Secondary | ICD-10-CM

## 2020-07-22 LAB — CBC
HCT: 47.7 % (ref 39.0–52.0)
Hemoglobin: 15.8 g/dL (ref 13.0–17.0)
MCH: 31.2 pg (ref 26.0–34.0)
MCHC: 33.1 g/dL (ref 30.0–36.0)
MCV: 94.3 fL (ref 80.0–100.0)
Platelets: 226 10*3/uL (ref 150–400)
RBC: 5.06 MIL/uL (ref 4.22–5.81)
RDW: 12.7 % (ref 11.5–15.5)
WBC: 11.5 10*3/uL — ABNORMAL HIGH (ref 4.0–10.5)
nRBC: 0 % (ref 0.0–0.2)

## 2020-07-22 LAB — URINALYSIS, ROUTINE W REFLEX MICROSCOPIC
Bacteria, UA: NONE SEEN
Bilirubin Urine: NEGATIVE
Glucose, UA: >=500 mg/dL — AB
Ketones, ur: NEGATIVE mg/dL
Leukocytes,Ua: NEGATIVE
Nitrite: NEGATIVE
Protein, ur: NEGATIVE mg/dL
Specific Gravity, Urine: 1.022 (ref 1.005–1.030)
pH: 6 (ref 5.0–8.0)

## 2020-07-22 LAB — GLUCOSE, CAPILLARY
Glucose-Capillary: 139 mg/dL — ABNORMAL HIGH (ref 70–99)
Glucose-Capillary: 198 mg/dL — ABNORMAL HIGH (ref 70–99)
Glucose-Capillary: 166 mg/dL — ABNORMAL HIGH (ref 70–99)
Glucose-Capillary: 192 mg/dL — ABNORMAL HIGH (ref 70–99)

## 2020-07-22 IMAGING — CT CT HEAD W/O CM
4 series · 15 of 47 positions shown, 17 images · non-contrast
Comparison: Head CT [DATE], brain MRI [DATE].

CLINICAL DATA: 79-year-old male with altered mental status. Recent
code stroke presentation and anterior left MCA territory infarct.

EXAM:
CT HEAD WITHOUT CONTRAST
TECHNIQUE: Contiguous axial images were obtained from the base of the skull
through the vertex without intravenous contrast.

[Series 3: head without · axial · non-contrast · 0.47mm/px · z∈[-264,-144]mm · 7 of 33 slices shown, 9 images]
[im 5/33  brain]
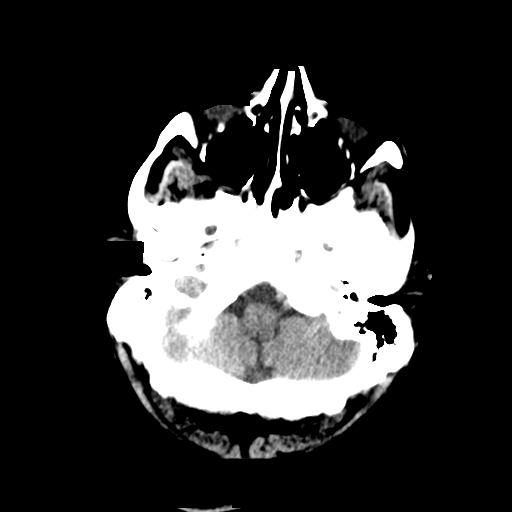
[im 5/33  bone]
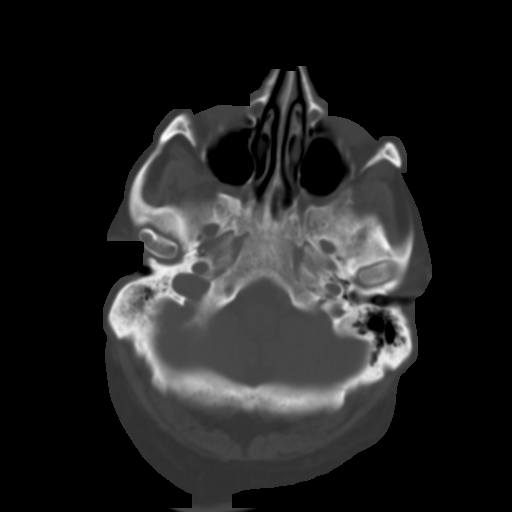
[im 9/33  brain]
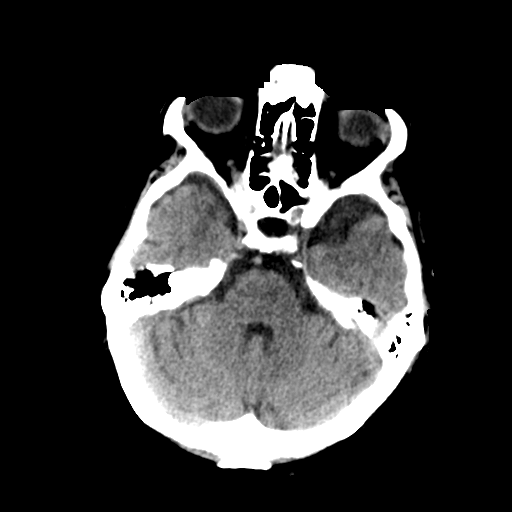
[im 13/33  brain]
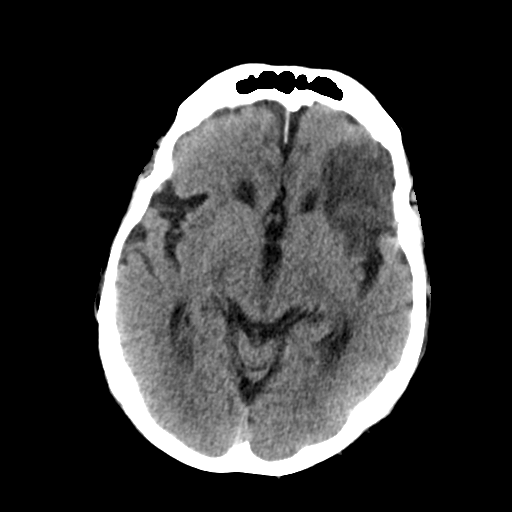
[im 17/33  brain]
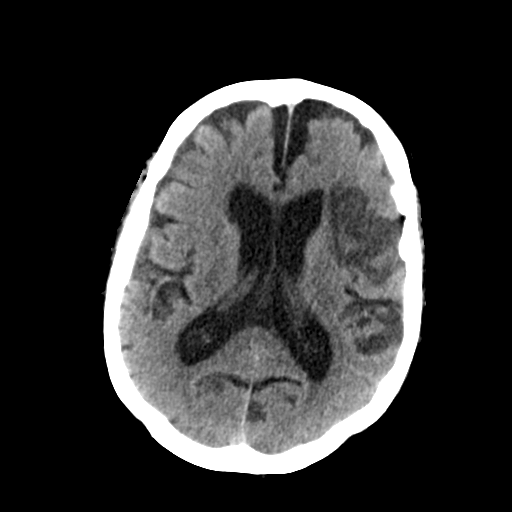
[im 21/33  brain]
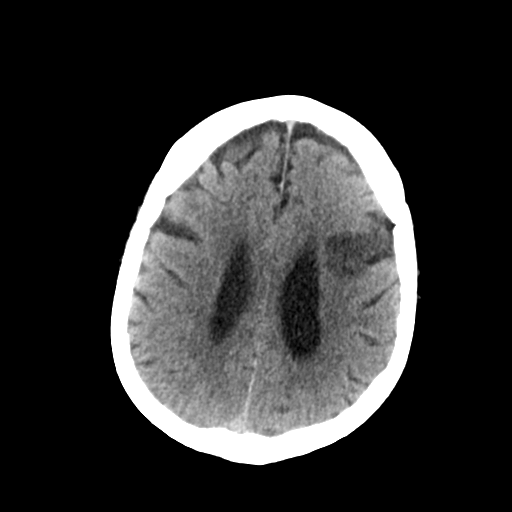
[im 21/33  bone]
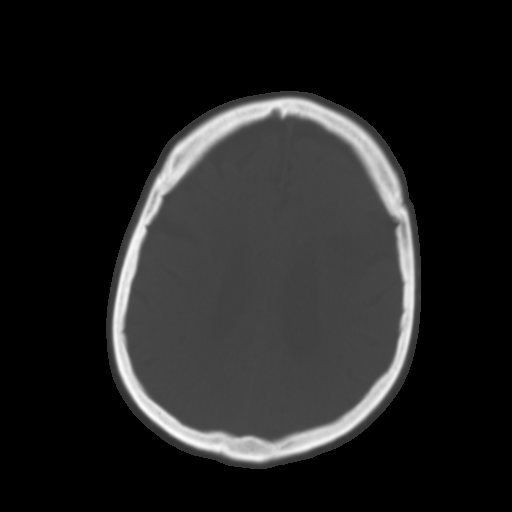
[im 25/33  brain]
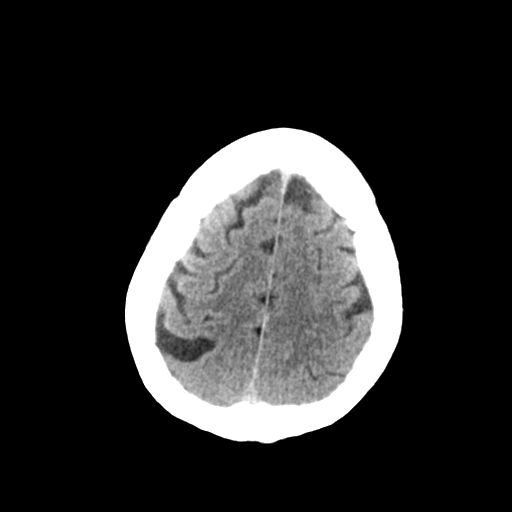
[im 29/33  brain]
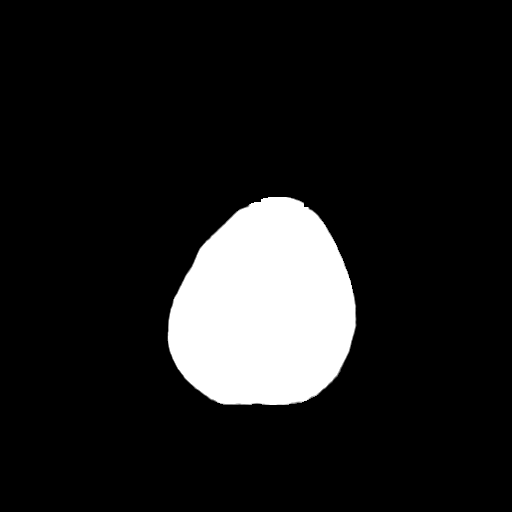

[Series 4: head bone · axial · 0.47mm/px · z∈[-268,-252]mm · 2 of 83 slices shown]
[im 9/83  bone]
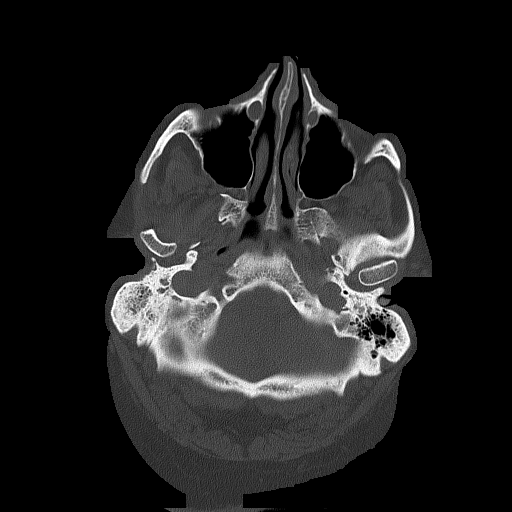
[im 17/83  bone]
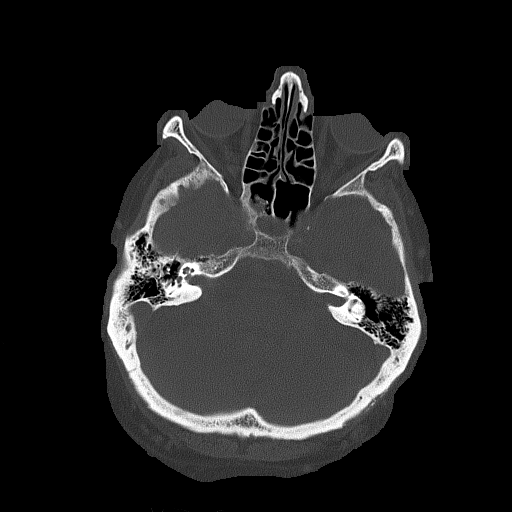

[Series 5: head without cor · coronal · non-contrast · 0.31mm/px · 3 of 73 slices shown]
[im 25/73  brain]
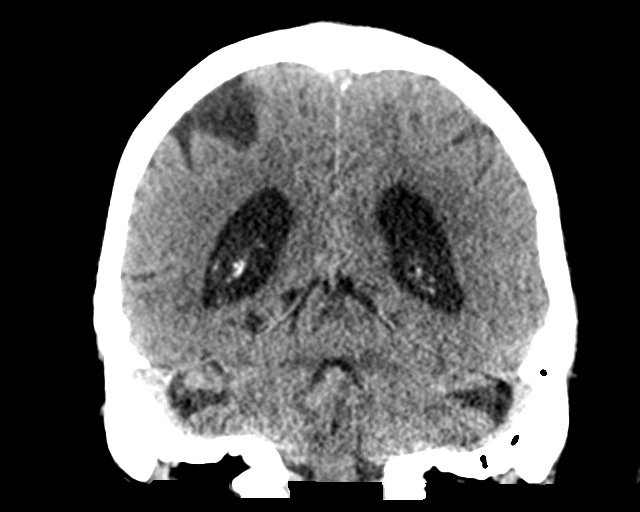
[im 33/73  brain]
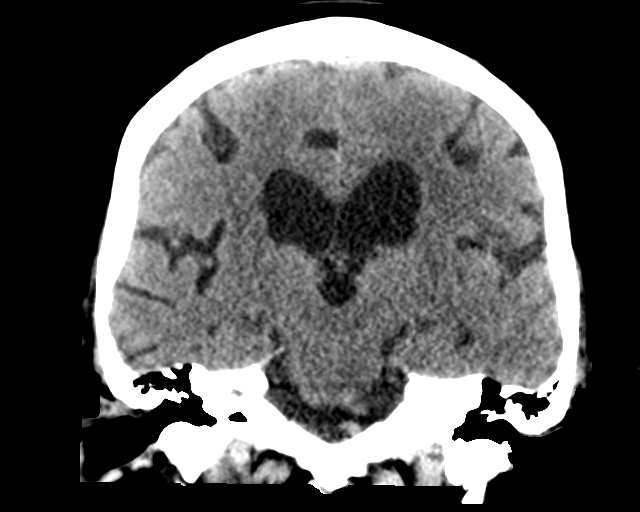
[im 41/73  brain]
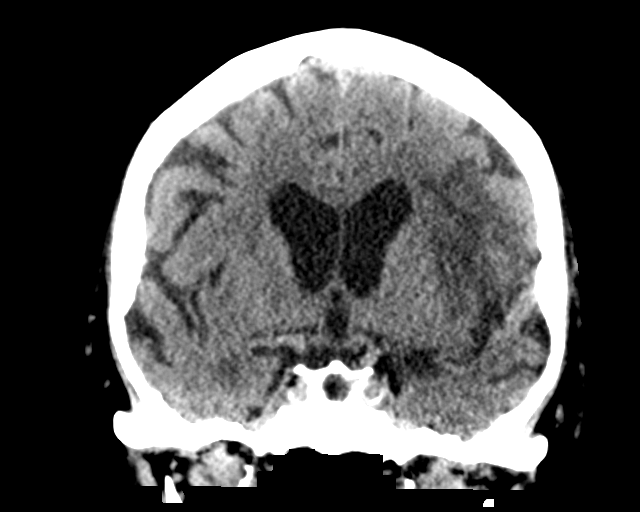

[Series 6: head without sag · sagittal · non-contrast · 0.31mm/px · 3 of 67 slices shown]
[im 23/67  brain]
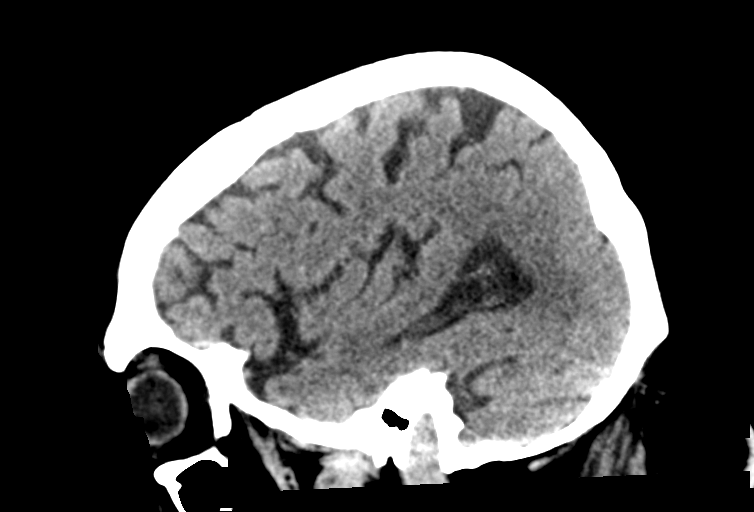
[im 34/67  brain]
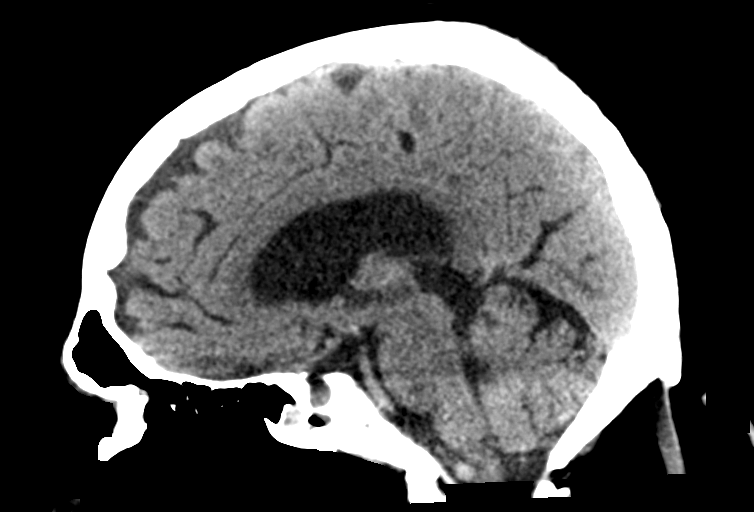
[im 45/67  brain]
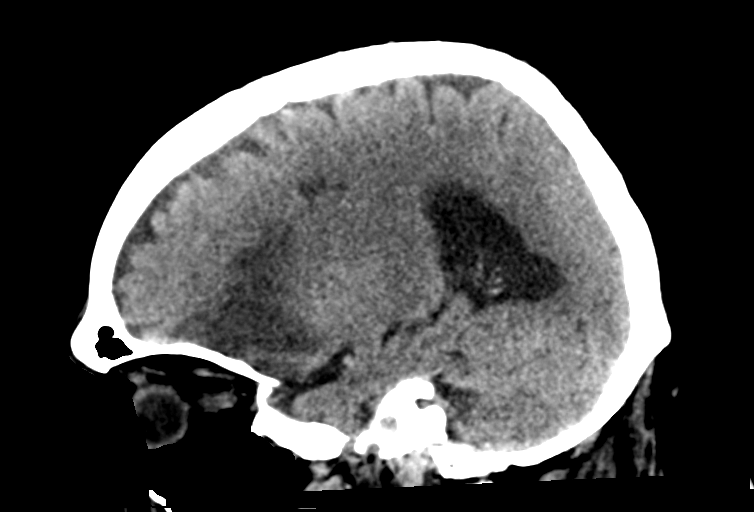

[15 of 47 positions shown; findings below may reference images not displayed]

FINDINGS: Brain: Evolving anterior left MCA division infarct with decreased
regional mass effect since [DATE]. Possible petechial
hemorrhage. No malignant hemorrhagic transformation.

Elsewhere stable gray-white matter differentiation throughout the
brain. No new cortically based infarct. No midline shift or
ventriculomegaly.

Vascular: Mild Calcified atherosclerosis at the skull base. No
suspicious intracranial vascular hyperdensity.

Skull: No acute osseous abnormality identified.

Sinuses/Orbits: Stable paranasal sinuses. Chronic right sphenoid
sinusitis. Stable mild mastoid effusions.

Other: Visualized orbits and scalp soft tissues are within normal
limits.
IMPRESSION: 1. Expected evolution of anterior Left MCA division infarct.
Possible petechial hemorrhage but no malignant hemorrhagic
transformation and mildly decreased regional mass effect since
[DATE].
[DATE]. No new intracranial abnormality.

## 2020-07-22 MED ORDER — LORAZEPAM 0.5 MG PO TABS: 1.0000 mg | ORAL_TABLET | Freq: Every day | ORAL | Status: DC | PRN

## 2020-07-22 MED ORDER — SORBITOL 70 % SOLN
30.0000 mL | Freq: Every day | Status: DC | PRN
  Administered 2020-07-22 – 2020-07-28 (×3): 30 mL via ORAL
  Filled 2020-07-22 (×3): qty 30

## 2020-07-22 NOTE — Progress Notes (Addendum)
Patient alert x1- expressive aphasia still noted. Per therapy patient more fatigued/drowsy today. Patient requiring max assist with some transfers and increased motor planning problems noted per therapy report. MD Posey Pronto notified. New orders received-CBC,UA Head CT.  1125- MD Posey Pronto notified of CT results. No new orders received. Continue to monitor and maintain plan of care.   1800-Patient more alert/awake this evening. Patient ate 100% of dinner. Used urinal with assist.

## 2020-07-22 NOTE — Progress Notes (Signed)
Physical Therapy Session Note  Patient Details  Name: Edwin Wall MRN: [id number] Date of Birth: 05-27-1940  Today's Date: 07/22/2020 PT Individual Time: 0728-0828 PT Individual Time Calculation (min): 60 min   Short Term Goals: Week 1:  PT Short Term Goal 1 (Week 1): pt to demonstrate supine<>sit CGA PT Short Term Goal 2 (Week 1): pt to demonstrate functional transfers CGA PT Short Term Goal 3 (Week 1): pt to demonstrate gait 200' CGA with good awareness of Rt side  Skilled Therapeutic Interventions/Progress Updates:    Pt presents in bed and generally agreeable to session. PT gathered clothing to prepare for OOB and requiresmod to max verbal cues for technique and tactile cues for technique and initiation. Trunk rotation stretching to initiate movement due to reported "stiffness". Noted to have soiled brief (pt unaware), so returned to supine and bridging technique to change brief. Pt performed hygiene and PT performed hygiene on backside and total assist to don new brief. Poor recall of technique for rolling which we just performed to initiate coming to EOB with pt stating "I don't know how to roll." mod assist to facilitation and assist with sequencing. Max assist for BLE management and facilitation at trunk to come to seated position EOB with fluctuating levels of assist due to poor postural control with LOB posterior and R laterally with pt demonstrating decreased awareness and no response to adjust for LOB. When instructed to use LUE to stablize on footboard, pt able to maintain position for PT to assist with threading of pants and donning of shoes. Pt initiated helping pull up pants from seated position but due to posterior LOB was unable to complete without assistance. Multiple attempts for sit <> stands with RW to pull up pants requiring mod to max assist to get into standing position, then mod assist on next attempt with facilitation for anterior weightshift and cues for hand  placement. Total assist to pull up pants while pt maintain balance with BUE support on RW with min assist. Min assist to transfer to w/c once up in standing with step by step cues needed for footplacement and advancement to take steps to the w/c. Left room to attempt to arouse patient more and he was alert while PT retrieved cushion for w/c to promote more OOB tolerance. Performed sit <> stand with mod assist with RW and cues for technique to place new cushion. Performed 3' gait forwards and backwards and then says "i'm done" returning to seated position. Pt difficulty with anterior lean to remove gait belt and apply seat belt alarm and someone disgruntled towards PT and NT at this point when asked to chang position and maintaining eyes closed. RN was made aware earlier in session and observed the end of the session as well. Pt appears more lethargic today and increased difficulty with mobility than chart reads prior.  Therapy Documentation Precautions:  Precautions Precautions: Fall Precaution Comments: R side inattention Restrictions Weight Bearing Restrictions: No   Pain:  Doesn't report pain consistently throughout session. Lethargic.   Therapy/Group: Individual Therapy  [name], PT, DPT, CBIS  07/22/2020, 8:28 AM

## 2020-07-22 NOTE — Progress Notes (Signed)
Speech Language Pathology Daily Session Note  Patient Details  Name: Edwin Wall MRN: [id number] Date of Birth: [birth year]  Today's Date: 07/22/2020 SLP Individual Time: 1115-1200 SLP Individual Time Calculation (min): 45 min  Short Term Goals: Week 1: SLP Short Term Goal 1 (Week 1): Patient will name at least 5 objects in category with min-modA cues. SLP Short Term Goal 2 (Week 1): Patient will describe object function for common objects, at phrase level with minA cues. SLP Short Term Goal 3 (Week 1): Patient will describe action pictures/photos at phrase level with minA cues. SLP Short Term Goal 4 (Week 1): Patient demonstrate awareness to errors when given modA cues during highly structured language tasks. SLP Short Term Goal 5 (Week 1): Patient will perform two-step commands/instructions with 80% accuracy and modA cues.  Skilled Therapeutic Interventions:  Pt was seen for skilled ST targeting family education.  Pt has been quite lethargic and had recently returned to room from CT head.  Results are as follows: 1. Expected evolution of anterior Left MCA division infarct.Possible petechial hemorrhage but no malignant hemorrhagic transformation and mildly decreased regional mass effect since 07/17/2020.  2. No new intracranial abnormality.  Nursing reported that pt is cleared to participate in treatment as tolerated but pt remained difficult to arouse for today's treatment.  While trying to arouse pt, his wife presented with many valid questions and concerns regarding pt's recovery and ways to help support pt's functional communication.  As a result, today's session was solely focused on family education.  SLP provided skilled education regarding sequela of stroke recovery and the role of CIR level therapies in maximizing pt's long term functional independence.  Realistic prognostic expectations for recovery were also discussed in light of pt's progress thus far.  SLP educated pt's wife on  strategies to elicit language and support pt's attempts in functional communication, including allowing pt extra time for communication, providing close ended questions/choice of two versus open ended questions, paying attention to distractions in the environment and times of day where communication will be more difficult, and providing descriptive clues to elicit independent word finding.  SLP provided pt's wife with an "Understanding Aphasia" booklet to maximize carryover in the home environment.  All questions were answered to pt's wife's satisfaction at this time.  Pt was left resting in bed with bed alarm set and call bell within reach.    Pain Pain Assessment Pain Scale: 0-10 Pain Score: 0-No pain  Therapy/Group: Individual Therapy  Nainika Newlun, Selinda Orion 07/22/2020, 1:14 PM

## 2020-07-22 NOTE — Progress Notes (Signed)
PROGRESS NOTE   Subjective/Complaints: Patient seen laying in bed this morning.  Wife at bedside.  Patient is somnolent, but arousable.  Notified by therapies regarding significant increase in functional assistance as well as lethargy.  ROS: Limited due to somnolence  Objective:   CT HEAD WO CONTRAST  Result Date: 07/22/2020 CLINICAL DATA:  80 year old male with altered mental status. Recent code stroke presentation and anterior left MCA territory infarct. EXAM: CT HEAD WITHOUT CONTRAST TECHNIQUE: Contiguous axial images were obtained from the base of the skull through the vertex without intravenous contrast. COMPARISON:  Head CT 07/17/2020, brain MRI 07/15/2020. FINDINGS: Brain: Evolving anterior left MCA division infarct with decreased regional mass effect since 07/17/2020. Possible petechial hemorrhage. No malignant hemorrhagic transformation. Elsewhere stable gray-white matter differentiation throughout the brain. No new cortically based infarct. No midline shift or ventriculomegaly. Vascular: Mild Calcified atherosclerosis at the skull base. No suspicious intracranial vascular hyperdensity. Skull: No acute osseous abnormality identified. Sinuses/Orbits: Stable paranasal sinuses. Chronic right sphenoid sinusitis. Stable mild mastoid effusions. Other: Visualized orbits and scalp soft tissues are within normal limits. IMPRESSION: 1. Expected evolution of anterior Left MCA division infarct. Possible petechial hemorrhage but no malignant hemorrhagic transformation and mildly decreased regional mass effect since 07/17/2020. 2. No new intracranial abnormality. Electronically Signed   By: Genevie Ann M.D.   On: 07/22/2020 11:10   Recent Labs    07/22/20 1031  WBC 11.5*  HGB 15.8  HCT 47.7  PLT 226   No results for input(s): NA, K, CL, CO2, GLUCOSE, BUN, CREATININE, CALCIUM in the last 72 hours.  Intake/Output Summary (Last 24 hours) at  07/22/2020 1348 Last data filed at 07/22/2020 1200 Gross per 24 hour  Intake 360 ml  Output 100 ml  Net 260 ml        Physical Exam: Vital Signs Blood pressure 126/77, pulse 89, temperature 98.2 F (36.8 C), resp. rate (!) 24, weight 111.2 kg, SpO2 96 %. Constitutional: No distress . Vital signs reviewed. HENT: Normocephalic.  Atraumatic. Eyes: EOMI. No discharge. Cardiovascular: No JVD.  Irregularly irregular. Respiratory: Normal effort.  No stridor.  Bilateral clear to auscultation. GI: Non-distended.  BS +. Skin: Warm and dry.  Intact. Psych: Limited due to somnolence Musc: No edema in extremities.  No tenderness in extremities. Neuro: Somnolent Aphasia Motor exam remains somewhat limited due to participation, however >/4/5 throughout, appears unchanged  Assessment/Plan: 1. Functional deficits which require 3+ hours per day of interdisciplinary therapy in a comprehensive inpatient rehab setting.  Physiatrist is providing close team supervision and 24 hour management of active medical problems listed below.  Physiatrist and rehab team continue to assess barriers to discharge/monitor patient progress toward functional and medical goals  Care Tool:  Bathing    Body parts bathed by patient: Right arm,Left arm,Chest,Abdomen,Front perineal area,Buttocks,Left upper leg,Right upper leg,Face     Body parts n/a: Left lower leg,Right lower leg   Bathing assist Assist Level: Minimal Assistance - Patient > 75%     Upper Body Dressing/Undressing Upper body dressing   What is the patient wearing?: Pull over shirt    Upper body assist Assist Level: Contact Guard/Touching assist (standing)    Lower  Body Dressing/Undressing Lower body dressing      What is the patient wearing?: Pants,Incontinence brief     Lower body assist Assist for lower body dressing: Moderate Assistance - Patient 50 - 74%     Toileting Toileting    Toileting assist Assist for toileting: Maximal  Assistance - Patient 25 - 49%     Transfers Chair/bed transfer  Transfers assist     Chair/bed transfer assist level: Moderate Assistance - Patient 50 - 74%     Locomotion Ambulation   Ambulation assist      Assist level: Minimal Assistance - Patient > 75% Assistive device: No Device Max distance: 75   Walk 10 feet activity   Assist     Assist level: Minimal Assistance - Patient > 75% Assistive device: No Device   Walk 50 feet activity   Assist    Assist level: Minimal Assistance - Patient > 75% Assistive device: No Device    Walk 150 feet activity   Assist Walk 150 feet activity did not occur: Safety/medical concerns         Walk 10 feet on uneven surface  activity   Assist Walk 10 feet on uneven surfaces activity did not occur: Safety/medical concerns         Wheelchair     Assist Will patient use wheelchair at discharge?: Yes (Per PT long term goals) Type of Wheelchair: Manual    Wheelchair assist level: Moderate Assistance - Patient 50 - 74% Max wheelchair distance: 150    Wheelchair 50 feet with 2 turns activity    Assist        Assist Level: Moderate Assistance - Patient 50 - 74%   Wheelchair 150 feet activity     Assist      Assist Level: Moderate Assistance - Patient 50 - 74%   Blood pressure 126/77, pulse 89, temperature 98.2 F (36.8 C), resp. rate (!) 24, weight 111.2 kg, SpO2 96 %.  Medical Problem List and Plan: 1.  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  CT scan showing expected evolution of left MCA infarct with?  Petechial hemorrhage. 2.  Antithrombotics: -DVT/anticoagulation: Awaiting plan transition to Eliquis             -antiplatelet therapy: Currently on aspirin 325 mg daily  awaiting transition to Eliquis for stroke prevention 3. Pain Management: Tylenol as needed  Controlled on 5/22 4. Mood: Ativan 1 mg daily.  Provide emotional support             -antipsychotic agents: [name] 5. Neuropsych: This patient is? capable of making decisions on his own behalf. 6. Skin/Wound Care: Routine skin checks 7. Fluids/Electrolytes/Nutrition: Routine in and outs 8.  Hypertension  Relatively controlled on 5/22             Monitor increase activity 9.  Hyperlipidemia.  Crestor/Colestid 10.  New onset atrial fibrillation.  Cardiac rate controlled.  Await plan to begin Eliquis  Rate controlled on 5/22             Monitor with increased activity 11.  Diabetes mellitus type 2 with hyperglycemia.  Hemoglobin A1c 6.6 2018 and 7.1 on this admission.  He was on no glucose lowering agents at home.  Currently SSI.  Await plan to possibly begin metformin.  Started Farxiga 5mg  daily per pharmacy   Slightly labile on 5/22, would not make any further adjustments today             Monitor increase mobility 12.  History of colon cancer with colectomy 2018.  Follow-up outpatient 13.  Obesity.  BMI 36.24.  Dietary follow-up 14.  AKI/CKD.    Creatinine 1.68 on 5/19, labs ordered for tomorrow  Continue to monitor 15.  Decreased function - patient likely wax/wanes as previously noted to be extremely somnolent on acute floor.   CT head ordered, essentially showing expected evolution.  WBCs elevated at 11.5 on 5/22, however this appears to be within patient's range.  No fevers or signs/symptoms of infection.  UA/Ucx pending  Blood cultures ordered   LOS: 4 days A FACE TO FACE EVALUATION WAS PERFORMED  Jeree Delcid Lorie Phenix 07/22/2020, 1:48 PM

## 2020-07-22 NOTE — Progress Notes (Signed)
Physical Therapy Session Note  Patient Details  Name: Edwin Wall MRN: [id number] Date of Birth: 1940/04/30  Today's Date: 07/22/2020 PT Individual Time: 5638-7564 PT Individual Time Calculation (min): 30 min   Short Term Goals: Week 1:  PT Short Term Goal 1 (Week 1): pt to demonstrate supine<>sit CGA PT Short Term Goal 2 (Week 1): pt to demonstrate functional transfers CGA PT Short Term Goal 3 (Week 1): pt to demonstrate gait 200' CGA with good awareness of Rt side  Skilled Therapeutic Interventions/Progress Updates:    Assisting with RN for changing of brief and hygiene at bed level requiring mod verbal cues and mod to max facilitation for rolling, extra time for processing needed. Pt alert but bouts of lethargy still with maintaining eyes closed majority of the session and difficulty with attention to activity. Significant amount of time given for pt to attempt to come to EOB ultimately requiring max assist physically to get into position to see if it would assist with arousal state and ability to functionally participate. Pt unintelligible with speech at times during this which is wife noted as well. Once EOB pt able to maintain balance with min assist initially but then LOB posteriorly and unable to correct or attempt to make modifcations again and then returned into supine position self-selecting (initially stated he wanted to try to sit up in w/c once he was EOB). Repositioned with total assist in supine and will all needs in reach.   Therapy Documentation Precautions:  Precautions Precautions: Fall Precaution Comments: R side inattention Restrictions Weight Bearing Restrictions: No Pain: Denies pain but then making moaning sounds. Difficult to determine where pain is located and if accurate report due to aphasia    Therapy/Group: Individual Therapy  [name], PT, DPT, CBIS  07/22/2020, 2:13 PM

## 2020-07-22 NOTE — Plan of Care (Signed)
  Problem: RH BOWEL ELIMINATION Goal: RH STG MANAGE BOWEL WITH ASSISTANCE Description: STG Manage Bowel with Mod I Assistance. Outcome: Not Progressing Goal: RH STG MANAGE BOWEL W/MEDICATION W/ASSISTANCE Description: STG Manage Bowel with Medication with Mod I Assistance. Outcome: Not Progressing   

## 2020-07-22 NOTE — Progress Notes (Signed)
Occupational Therapy Session Note  Patient Details  Name: Edwin Wall MRN: [id number] Date of Birth: 09-02-1940  Today's Date: 07/22/2020 OT Individual Time: 0900-0940 OT Individual Time Calculation (min): 40 min  and Today's Date: 07/22/2020 OT Missed Time: 20 Minutes Missed Time Reason: Patient fatigue   Short Term Goals: Week 1:  OT Short Term Goal 1 (Week 1): STGs = LTGs  Skilled Therapeutic Interventions/Progress Updates:    Pt received with RN presenting informing that pt more fatigued today. Spoke with PT from this AM and OT who saw pt previously this week. Both informing of decline. Transitioned to therapy gym with brighter lights to increase alertness with positive response for short time. Completed ball toss focusing on motor planning skills, attention, and UB strength. Pt completed 20-30 reps with minimal dropping of ball, however max difficulty to flexion shoulders and toss overhead. Attempted motor planning exercises with BLE in w/c with max difficulty following multi-modal cues for marching. Attempted sit<>stand with pt unable to complete and poor ability to keep eyes open. Returned to room with wife present and also acknowledging more fatigue. Completed sit>stand with heavy mod A then transferred to bed with min A and cues for sequencing with RW. Completed scooting in bed with increased time and S then sit>supine with max A. Pt left with all needs in reach and RN notified.  In supine: BP: 128/83, SpO2 96%, HR 86  Therapy Documentation Precautions:  Precautions Precautions: Fall Precaution Comments: R side inattention Restrictions Weight Bearing Restrictions: No General: General OT Amount of Missed Time: 20 Minutes Vital Signs:  Pain:   ADL: ADL Eating: Set up Grooming: Setup Upper Body Bathing: Setup Where Assessed-Upper Body Bathing: Shower Lower Body Bathing: Contact guard Where Assessed-Lower Body Bathing: Shower Upper Body Dressing:  Supervision/safety Where Assessed-Upper Body Dressing: Edge of bed Lower Body Dressing: Contact guard Where Assessed-Lower Body Dressing: Edge of bed Toileting: Contact guard Where Assessed-Toileting: Glass blower/designer: Therapist, music Method: Counselling psychologist: Energy manager: Curator Method: Heritage manager: Regulatory affairs officer    Praxis   Exercises:   Other Treatments:     Therapy/Group: Individual Therapy  Duayne Cal 07/22/2020, 9:48 AM

## 2020-07-23 ENCOUNTER — Inpatient Hospital Stay (HOSPITAL_COMMUNITY): Payer: Medicare Other

## 2020-07-23 LAB — CBC WITH DIFFERENTIAL/PLATELET
Abs Immature Granulocytes: 0.05 10*3/uL (ref 0.00–0.07)
Basophils Absolute: 0.1 10*3/uL (ref 0.0–0.1)
Basophils Relative: 0 %
Eosinophils Absolute: 0 10*3/uL (ref 0.0–0.5)
Eosinophils Relative: 0 %
HCT: 44.2 % (ref 39.0–52.0)
Hemoglobin: 14.5 g/dL (ref 13.0–17.0)
Immature Granulocytes: 0 %
Lymphocytes Relative: 15 %
Lymphs Abs: 1.8 10*3/uL (ref 0.7–4.0)
MCH: 30.9 pg (ref 26.0–34.0)
MCHC: 32.8 g/dL (ref 30.0–36.0)
MCV: 94.2 fL (ref 80.0–100.0)
Monocytes Absolute: 1.5 10*3/uL — ABNORMAL HIGH (ref 0.1–1.0)
Monocytes Relative: 12 %
Neutro Abs: 9.1 10*3/uL — ABNORMAL HIGH (ref 1.7–7.7)
Neutrophils Relative %: 73 %
Platelets: 212 10*3/uL (ref 150–400)
RBC: 4.69 MIL/uL (ref 4.22–5.81)
RDW: 12.6 % (ref 11.5–15.5)
WBC: 12.6 10*3/uL — ABNORMAL HIGH (ref 4.0–10.5)
nRBC: 0 % (ref 0.0–0.2)

## 2020-07-23 LAB — BASIC METABOLIC PANEL
Anion gap: 8 (ref 5–15)
BUN: 25 mg/dL — ABNORMAL HIGH (ref 8–23)
CO2: 25 mmol/L (ref 22–32)
Calcium: 9.1 mg/dL (ref 8.9–10.3)
Chloride: 106 mmol/L (ref 98–111)
Creatinine, Ser: 1.68 mg/dL — ABNORMAL HIGH (ref 0.61–1.24)
GFR, Estimated: 41 mL/min — ABNORMAL LOW (ref >60)
Glucose, Bld: 174 mg/dL — ABNORMAL HIGH (ref 70–99)
Potassium: 3.8 mmol/L (ref 3.5–5.1)
Sodium: 139 mmol/L (ref 135–145)

## 2020-07-23 LAB — GLUCOSE, CAPILLARY
Glucose-Capillary: 176 mg/dL — ABNORMAL HIGH (ref 70–99)
Glucose-Capillary: 277 mg/dL — ABNORMAL HIGH (ref 70–99)
Glucose-Capillary: 164 mg/dL — ABNORMAL HIGH (ref 70–99)
Glucose-Capillary: 213 mg/dL — ABNORMAL HIGH (ref 70–99)

## 2020-07-23 LAB — URINE CULTURE: Culture: <10000 — AB

## 2020-07-23 IMAGING — DX DG CHEST 1V PORT
1 series · 1 of 1 positions shown · non-contrast
Comparison: None.

CLINICAL DATA: WBC increasing, leukocytosis

EXAM:
PORTABLE CHEST 1 VIEW

[chest ap]
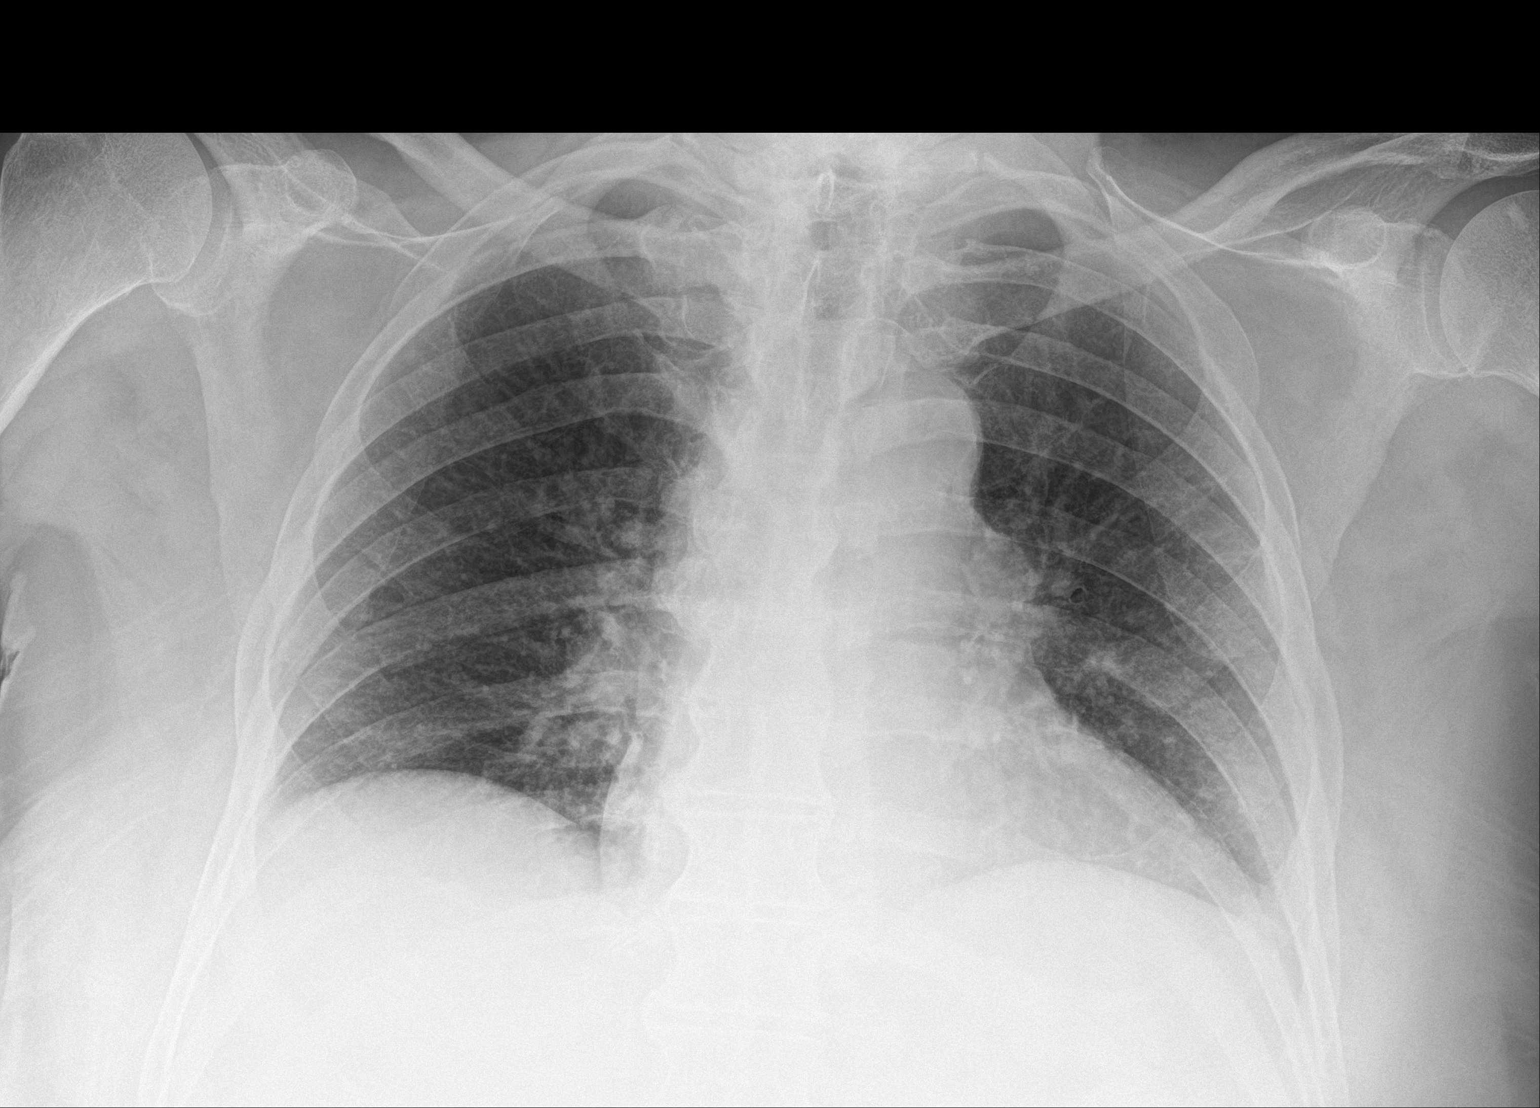

[1 of 1 positions shown; findings below may reference images not displayed]

FINDINGS: Cardiomegaly. Low volume AP portable examination with mild, diffuse
interstitial pulmonary opacity. The visualized skeletal structures
are unremarkable.
IMPRESSION: 1. Low volume AP portable examination with mild, diffuse
interstitial pulmonary opacity, which may reflect edema or
atypical/viral infection but may be exaggerated by low volume AP
portable technique. No focal airspace opacity.
2. Cardiomegaly.

## 2020-07-23 MED ORDER — SORBITOL 70 % SOLN
30.0000 mL | Freq: Once | Status: AC
  Administered 2020-07-23: 30 mL via ORAL
  Filled 2020-07-23: qty 30

## 2020-07-23 NOTE — Progress Notes (Signed)
Speech Language Pathology Daily Session Note  Patient Details  Name: COLLIE WERNICK MRN: [id number] Date of Birth: 11-01-1940  Today's Date: 07/23/2020 SLP Individual Time: 2841-3244 SLP Individual Time Calculation (min): 42 min  Short Term Goals: Week 1: SLP Short Term Goal 1 (Week 1): Patient will name at least 5 objects in category with min-modA cues. SLP Short Term Goal 2 (Week 1): Patient will describe object function for common objects, at phrase level with minA cues. SLP Short Term Goal 3 (Week 1): Patient will describe action pictures/photos at phrase level with minA cues. SLP Short Term Goal 4 (Week 1): Patient demonstrate awareness to errors when given modA cues during highly structured language tasks. SLP Short Term Goal 5 (Week 1): Patient will perform two-step commands/instructions with 80% accuracy and modA cues.  Skilled Therapeutic Interventions:Skilled ST services focused on language skills. SLP facilitated naming object function, pt demonstrated ability in 9 out 10 opportunities with 40% semantic cues. Pt demonstrated naming action verba in photographs in 8 out 10 opportunities. Pt demonstrated increase difficulty naming objects in pictures demonstrating function (verbs) at phrase level  With 5 out 10 accuracy, but was able to name name objects alone in photographs in 10 out 10 opportunities. Pt demonstrated oral holding with medication, whole in puree, however large pill was attempted first and he eventually had to remove it. SLP recommends to continue whole in puree starting with small pills 1 at a time. Pt was left in room with call bell within reach and bed alarm set. SLP recommends to continue skilled services.     Pain Pain Assessment Pain Score: 0-No pain  Therapy/Group: Individual Therapy  Henok Heacock  [facility] 07/23/2020, 4:10 PM

## 2020-07-23 NOTE — Progress Notes (Signed)
Occupational Therapy Session Note  Patient Details  Name: Edwin Wall MRN: [id number] Date of Birth: [birth year]  Today's Date: 07/23/2020 OT Individual Time: 9357-0177 OT Individual Time Calculation (min): 57 min    Short Term Goals: Week 1:  OT Short Term Goal 1 (Week 1): STGs = LTGs   Skilled Therapeutic Interventions/Progress Updates:    Pt greeted at time of session supine in bed resting with wife present, agreeable to OT session and no pain. Supine > sit from flat bed to simulate home with Mod A for trunk elevation. Sitting EOB, extensive amount of time talking with wife regarding DC planning, home environment, etc. Sit > stand initially unable to sequence no AD, once given RW, pt able to sit > stand Mod A. Donned pants with Mod A, pt able to thread with anterior weight shift and assist in standing to don over hips, Min A dynamic standing balance. Stand pivot bed > wheelchair Min A once standing with RW. Note R innattention during transfer. Wheelchair transport to gym and sit > stand x3 with Min A consistently with cues for hand/foot placement. And 3x10 marches with brief rest break in between sets. Wheelchair > recliner Min A alarm on call bell in reach.    Therapy Documentation Precautions:  Precautions Precautions: Fall Precaution Comments: R side inattention Restrictions Weight Bearing Restrictions: No     Therapy/Group: Individual Therapy  [name] 07/23/2020, 7:24 AM

## 2020-07-23 NOTE — Progress Notes (Signed)
Physical Therapy Session Note  Patient Details  Name: Edwin Wall MRN: [id number] Date of Birth: 1940/05/14  Today's Date: 07/23/2020 PT Individual Time: 1500-1530 PT Individual Time Calculation (min): 30 min   Short Term Goals: Week 1:  PT Short Term Goal 1 (Week 1): pt to demonstrate supine<>sit CGA PT Short Term Goal 2 (Week 1): pt to demonstrate functional transfers CGA PT Short Term Goal 3 (Week 1): pt to demonstrate gait 200' CGA with good awareness of Rt side  Skilled Therapeutic Interventions/Progress Updates:    Pt received supine in bed, agreeable to PT session. Pt reports some pain in his L knee at rest, not rated and declines intervention. Supine to sit with max A for BLE management and trunk control. Pt exhibits poor motor planning and ability to sequence transfer. Assisted pt with donning pants while seated EOB. Sit to stand with mod A to RW, pt able to pull pants up over hips in standing. Seated balance EOB with CGA performing ball toss, ball kick, then alt ball toss/kick. Pt gradually exhibits increase in LOB posteriorly, requires max tactile and verbal cueing to correct. Sit to stand with mod A to RW. Sidesteps towards [facility] with RW and mod A with cueing for sequencing. Sit to supine min A for RLE management. Pt left supine in bed with needs in reach, bed alarm in place.  Therapy Documentation Precautions:  Precautions Precautions: Fall Precaution Comments: R side inattention Restrictions Weight Bearing Restrictions: No    Therapy/Group: Individual Therapy   Excell Seltzer, PT, DPT, CSRS  07/23/2020, 4:13 PM

## 2020-07-23 NOTE — Progress Notes (Signed)
Physical Therapy Session Note  Patient Details  Name: Edwin Wall MRN: [id number] Date of Birth: 1940-12-25  Today's Date: 07/23/2020 PT Individual Time: 1300-1403 PT Individual Time Calculation (min): 63 min   Short Term Goals: Week 1:  PT Short Term Goal 1 (Week 1): pt to demonstrate supine<>sit CGA PT Short Term Goal 2 (Week 1): pt to demonstrate functional transfers CGA PT Short Term Goal 3 (Week 1): pt to demonstrate gait 200' CGA with good awareness of Rt side  Skilled Therapeutic Interventions/Progress Updates:    pt received in bed recliner agreeable to therapy. Per chart review, pt had functional decline over the weekend and increased lethargy and however no changes medically had been noted. Per pt's wife she felt he was "much better this morning". Pt agreeable to attempt standing, pt unable to come to standing without AD, Rolling walker given and pt required greatly extra time to complete, simple single step cues to complete with x3 attempts required to come to standing with max A. Once in standing pt unable to take steps forward and demonstrated trunk flexion and heavy reliance on walker for standing balance. Pt instructed to return to sitting. Pt reported he had urinated in standing. Pt required extra time to understand why he needed to change his brief and pants, then agreeable to sit at toilet. Pt directed in Stand pivot transfer with Rolling walker to WC mod A. Pt taken to toilet in [facility] total A for safety, directed in transfer to toilet mod A with single step cues to turn and sit at toilet. Pt unable to have BM or additional urine and total A for brief change. Pt then directed in Stand pivot transfer to [facility] mod A single step/simple cues. Pt then taken back to bedside total A in WC. Pt directed in Stand pivot transfer to bedside single step/simple VC for this with poor safety awareness noted and once sitting at EOB min A for balance. Pt required extra time and cues to lay down  with noted confusion and unable to complete without. Pt left in bed, All needs in reach and in good condition. Call light in hand.  And wife present. PA made aware.   During session pt consistently needed VC for all sequencing, motor planning, safety awareness and initiation/setup for all tasks.   Therapy Documentation Precautions:  Precautions Precautions: Fall Precaution Comments: R side inattention Restrictions Weight Bearing Restrictions: No General: PT Amount of Missed Time (min): 63 Minutes PT Missed Treatment Reason: Other (Comment) (fatigue however pt demonstrated overall decrease in function. see note for details) Vital Signs: Therapy Vitals Temp: 98.1 F (36.7 C) Pulse Rate: 83 Resp: 18 BP: 125/84 Patient Position (if appropriate): Lying Oxygen Therapy SpO2: 98 % O2 Device: Room Air Pain:   Mobility:   Locomotion :    Trunk/Postural Assessment :    Balance:   Exercises:   Other Treatments:      Therapy/Group: Individual Therapy  Junie Panning 07/23/2020, 3:20 PM

## 2020-07-23 NOTE — Progress Notes (Addendum)
PROGRESS NOTE   Subjective/Complaints:  Pt says "ok"- ate 100% of his tray.  Doesn't know when had LBM- but denies constipation.  LBM that I could find documented was 5/18- will order Sorbitol to get him to go.   ROS: limited by cogntion/aphasia  Objective:   CT HEAD WO CONTRAST  Result Date: 07/22/2020 CLINICAL DATA:  80 year old male with altered mental status. Recent code stroke presentation and anterior left MCA territory infarct. EXAM: CT HEAD WITHOUT CONTRAST TECHNIQUE: Contiguous axial images were obtained from the base of the skull through the vertex without intravenous contrast. COMPARISON:  Head CT 07/17/2020, brain MRI 07/15/2020. FINDINGS: Brain: Evolving anterior left MCA division infarct with decreased regional mass effect since 07/17/2020. Possible petechial hemorrhage. No malignant hemorrhagic transformation. Elsewhere stable gray-white matter differentiation throughout the brain. No new cortically based infarct. No midline shift or ventriculomegaly. Vascular: Mild Calcified atherosclerosis at the skull base. No suspicious intracranial vascular hyperdensity. Skull: No acute osseous abnormality identified. Sinuses/Orbits: Stable paranasal sinuses. Chronic right sphenoid sinusitis. Stable mild mastoid effusions. Other: Visualized orbits and scalp soft tissues are within normal limits. IMPRESSION: 1. Expected evolution of anterior Left MCA division infarct. Possible petechial hemorrhage but no malignant hemorrhagic transformation and mildly decreased regional mass effect since 07/17/2020. 2. No new intracranial abnormality. Electronically Signed   By: Genevie Ann M.D.   On: 07/22/2020 11:10   Recent Labs    07/22/20 1031 07/23/20 0616  WBC 11.5* 12.6*  HGB 15.8 14.5  HCT 47.7 44.2  PLT 226 212   Recent Labs    07/23/20 0616  NA 139  K 3.8  CL 106  CO2 25  GLUCOSE 174*  BUN 25*  CREATININE 1.68*  CALCIUM 9.1     Intake/Output Summary (Last 24 hours) at 07/23/2020 0956 Last data filed at 07/23/2020 0710 Gross per 24 hour  Intake 376 ml  Output 500 ml  Net -124 ml        Physical Exam: Vital Signs Blood pressure 129/90, pulse 92, temperature 99.6 F (37.6 C), temperature source Oral, resp. rate 16, weight 104.2 kg, SpO2 94 %.    General: awake, alert, appropriate, sitting up in bed; aphasic- no change- ate 100% tray- more alert; NAD HENT: conjugate gaze; oropharynx moist CV: irregular rhythm;  regular rate; no JVD Pulmonary: CTA B/L; no W/R/R- good air movement- decreased at bases slightly;  GI: soft, NT, ND, (+)BS Psychiatric: flat affect Neurological: aphasic and alert, but not oriented Skin: Warm and dry.  Intact. Musc: No edema in extremities.  No tenderness in extremities. Aphasia Motor exam remains somewhat limited due to participation, however >/4/5 throughout, appears unchanged  Assessment/Plan: 1. Functional deficits which require 3+ hours per day of interdisciplinary therapy in a comprehensive inpatient rehab setting.  Physiatrist is providing close team supervision and 24 hour management of active medical problems listed below.  Physiatrist and rehab team continue to assess barriers to discharge/monitor patient progress toward functional and medical goals  Care Tool:  Bathing    Body parts bathed by patient: Right arm,Left arm,Chest,Abdomen,Front perineal area,Buttocks,Left upper leg,Right upper leg,Face     Body parts n/a: Left lower leg,Right lower leg   Bathing  assist Assist Level: Minimal Assistance - Patient > 75%     Upper Body Dressing/Undressing Upper body dressing   What is the patient wearing?: Pull over shirt    Upper body assist Assist Level: Contact Guard/Touching assist (standing)    Lower Body Dressing/Undressing Lower body dressing      What is the patient wearing?: Pants,Incontinence brief     Lower body assist Assist for lower body  dressing: Moderate Assistance - Patient 50 - 74%     Toileting Toileting    Toileting assist Assist for toileting: Maximal Assistance - Patient 25 - 49%     Transfers Chair/bed transfer  Transfers assist     Chair/bed transfer assist level: Moderate Assistance - Patient 50 - 74%     Locomotion Ambulation   Ambulation assist      Assist level: Minimal Assistance - Patient > 75% Assistive device: No Device Max distance: 75   Walk 10 feet activity   Assist     Assist level: Minimal Assistance - Patient > 75% Assistive device: No Device   Walk 50 feet activity   Assist    Assist level: Minimal Assistance - Patient > 75% Assistive device: No Device    Walk 150 feet activity   Assist Walk 150 feet activity did not occur: Safety/medical concerns         Walk 10 feet on uneven surface  activity   Assist Walk 10 feet on uneven surfaces activity did not occur: Safety/medical concerns         Wheelchair     Assist Will patient use wheelchair at discharge?: Yes (Per PT long term goals) Type of Wheelchair: Manual    Wheelchair assist level: Moderate Assistance - Patient 50 - 74% Max wheelchair distance: 150    Wheelchair 50 feet with 2 turns activity    Assist        Assist Level: Moderate Assistance - Patient 50 - 74%   Wheelchair 150 feet activity     Assist      Assist Level: Moderate Assistance - Patient 50 - 74%   Blood pressure 129/90, pulse 92, temperature 99.6 F (37.6 C), temperature source Oral, resp. rate 16, weight 104.2 kg, SpO2 94 %.  Medical Problem List and Plan: 1.  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  CT scan showing expected evolution of left MCA infarct with?  Petechial hemorrhage.  5/23-  con't PT, OT and SLP 2.  Antithrombotics: -DVT/anticoagulation: Awaiting plan transition to Eliquis             -antiplatelet therapy: Currently on aspirin 325 mg daily awaiting transition to Eliquis for stroke prevention  5/23- has petechial hemorrhage possibly-  Started on Eliquis Friday- will monitor 3. Pain Management: Tylenol as needed  Controlled on 5/22 4. Mood: Ativan 1 mg daily.  Provide emotional support             -antipsychotic agents: [name] 5. Neuropsych: This patient is? capable of making decisions on his own behalf. 6. Skin/Wound Care: Routine skin checks 7. Fluids/Electrolytes/Nutrition: Routine in and outs 8.  Hypertension  Relatively controlled on 5/22             Monitor increase activity 9.  Hyperlipidemia.  Crestor/Colestid 10.  New onset atrial fibrillation.  Cardiac  rate controlled.  Await plan to begin Eliquis  5/23- on Eliquis- stable rhythm/irregular- con't regimen             Monitor with increased activity 11.  Diabetes mellitus type 2 with hyperglycemia.  Hemoglobin A1c 6.6 2018 and 7.1 on this admission.  He was on no glucose lowering agents at home.  Currently SSI.  Await plan to possibly begin metformin.  Started Farxiga 5mg  daily per pharmacy   Slightly labile on 5/22, would not make any further adjustments today  5/23- somewhat labile, but will try to increase [place] tomorrow to 10 mg daily.              Monitor increase mobility 12.  History of colon cancer with colectomy 2018.  Follow-up outpatient 13.  Obesity.  BMI 36.24.  Dietary follow-up 14.  AKI/CKD3B.    Creatinine 1.68 on 5/19, labs ordered for tomorrow  5/23- Cr 1.68- stable- con't to monitor  Continue to monitor 15.  Decreased function - patient likely wax/wanes as previously noted to be extremely somnolent on acute floor.   CT head ordered, essentially showing expected evolution.  WBCs elevated at 11.5 on 5/22, however this appears to be within patient's range.  No fevers or signs/symptoms  of infection.  UA/Ucx pending  Blood cultures ordered  5/23- U/A (-) except small Hb. No growth on blood Cx's so far- WBC up to 12.6- will also check CXR and looks better today- so will monitor and recheck in AM   16. Constipation  5/23- will order sorbitol if hasn't ha BM- documented in computer 5 days ago?  LOS: 5 days A FACE TO FACE EVALUATION WAS PERFORMED  Cataleyah Colborn 07/23/2020, 9:56 AM

## 2020-07-24 LAB — CBC WITH DIFFERENTIAL/PLATELET
Abs Immature Granulocytes: 0.05 10*3/uL (ref 0.00–0.07)
Basophils Absolute: 0.1 10*3/uL (ref 0.0–0.1)
Basophils Relative: 1 %
Eosinophils Absolute: 0.1 10*3/uL (ref 0.0–0.5)
Eosinophils Relative: 1 %
HCT: 50.4 % (ref 39.0–52.0)
Hemoglobin: 16.2 g/dL (ref 13.0–17.0)
Immature Granulocytes: 0 %
Lymphocytes Relative: 17 %
Lymphs Abs: 2.1 10*3/uL (ref 0.7–4.0)
MCH: 31 pg (ref 26.0–34.0)
MCHC: 32.1 g/dL (ref 30.0–36.0)
MCV: 96.6 fL (ref 80.0–100.0)
Monocytes Absolute: 1.5 10*3/uL — ABNORMAL HIGH (ref 0.1–1.0)
Monocytes Relative: 12 %
Neutro Abs: 8.6 10*3/uL — ABNORMAL HIGH (ref 1.7–7.7)
Neutrophils Relative %: 69 %
Platelets: 196 10*3/uL (ref 150–400)
RBC: 5.22 MIL/uL (ref 4.22–5.81)
RDW: 12.8 % (ref 11.5–15.5)
WBC: 12.4 10*3/uL — ABNORMAL HIGH (ref 4.0–10.5)
nRBC: 0 % (ref 0.0–0.2)

## 2020-07-24 LAB — BASIC METABOLIC PANEL
Anion gap: 11 (ref 5–15)
BUN: 27 mg/dL — ABNORMAL HIGH (ref 8–23)
CO2: 23 mmol/L (ref 22–32)
Calcium: 9.2 mg/dL (ref 8.9–10.3)
Chloride: 106 mmol/L (ref 98–111)
Creatinine, Ser: 1.62 mg/dL — ABNORMAL HIGH (ref 0.61–1.24)
GFR, Estimated: 43 mL/min — ABNORMAL LOW (ref >60)
Glucose, Bld: 145 mg/dL — ABNORMAL HIGH (ref 70–99)
Potassium: 5.3 mmol/L — ABNORMAL HIGH (ref 3.5–5.1)
Sodium: 140 mmol/L (ref 135–145)

## 2020-07-24 LAB — GLUCOSE, CAPILLARY
Glucose-Capillary: 161 mg/dL — ABNORMAL HIGH (ref 70–99)
Glucose-Capillary: 164 mg/dL — ABNORMAL HIGH (ref 70–99)
Glucose-Capillary: 227 mg/dL — ABNORMAL HIGH (ref 70–99)

## 2020-07-24 MED ORDER — SORBITOL 70 % SOLN
60.0000 mL | Freq: Once | Status: AC
  Administered 2020-07-24: 60 mL via ORAL
  Filled 2020-07-24: qty 60

## 2020-07-24 MED ORDER — AMANTADINE HCL 100 MG PO CAPS
100.0000 mg | ORAL_CAPSULE | Freq: Every day | ORAL | Status: DC
  Administered 2020-07-24 – 2020-07-30 (×7): 100 mg via ORAL
  Filled 2020-07-24 (×7): qty 1

## 2020-07-24 MED ORDER — DAPAGLIFLOZIN PROPANEDIOL 10 MG PO TABS
10.0000 mg | ORAL_TABLET | Freq: Every day | ORAL | Status: DC
  Administered 2020-07-25 – 2020-08-03 (×10): 10 mg via ORAL
  Filled 2020-07-24 (×11): qty 1

## 2020-07-24 NOTE — Progress Notes (Signed)
Speech Language Pathology Daily Session Note  Patient Details  Name: Edwin Wall MRN: [id number] Date of Birth: March 13, 1940  Today's Date: 07/24/2020 SLP Individual Time: 9390-3009 SLP Individual Time Calculation (min): 43 min  Short Term Goals: Week 1: SLP Short Term Goal 1 (Week 1): Patient will name at least 5 objects in category with min-modA cues. SLP Short Term Goal 2 (Week 1): Patient will describe object function for common objects, at phrase level with minA cues. SLP Short Term Goal 3 (Week 1): Patient will describe action pictures/photos at phrase level with minA cues. SLP Short Term Goal 4 (Week 1): Patient demonstrate awareness to errors when given modA cues during highly structured language tasks. SLP Short Term Goal 5 (Week 1): Patient will perform two-step commands/instructions with 80% accuracy and modA cues.  Skilled Therapeutic Interventions:Skilled ST services focused on language skills. SLP facilitated naming verbs in photograph picture cards in 14 out 15 opportunities (93%) and naming objects in picture cards in 13 out 15 opportunities (86%) with cues required on 9 out 15 opportunities (60%) including sentence completion for objects and phonetic/demonstration cues to name verbs. Pt was able to name objects in black and whit pictures in 10 out 10 opportunities with 20% sentence completion cues needed. Pt was able to name verbs in black/white pictures in 4 out 5 opportunities with mod A sentence completion and phonetic cues. Pt demonstrated increase awareness of verbal errors noting 9 out 12 errors (75%.) Pt reported not issues in consuming pills whole with water, compared to yesterday. Pt was left in room with call bell within reach and bed alarm set. SLP recommends to continue skilled services.     Pain Pain Assessment Pain Score: 0-No pain  Therapy/Group: Individual Therapy  [name]  [facility] 07/24/2020, 1:17 PM

## 2020-07-24 NOTE — Patient Care Conference (Signed)
Inpatient RehabilitationTeam Conference and Plan of Care Update Date: 07/24/2020   Time: 11:30 AM    Patient Name: Edwin Wall      Medical Record Number: [id number]  Date of Birth: June 11, 1940 Sex: Male         Room/Bed: 4M03C/4M03C-01 Payor Info: Payor: MEDICARE / Plan: MEDICARE PART A AND B / Product Type: *No Product type* /    Admit Date/Time:  07/18/2020  5:09 PM  Primary Diagnosis:  Left middle cerebral artery stroke [facility])  Hospital Problems: Principal Problem:   Left middle cerebral artery stroke [facility]) Active Problems:   Type II diabetes mellitus ([place])   New onset atrial fibrillation ([place])   Aphasia   Acute right hemiparesis ([place])   Leukocytosis   Somnolence    Expected Discharge Date: Expected Discharge Date: 08/09/20  Team Members Present: Physician leading conference: Dr. Courtney Heys Care Coodinator Present: Erlene Quan, BSW;Rheana Casebolt Creig Hines, RN, BSN, CRRN Nurse Present: Dorthula Nettles, RN PT Present: Stacy Gardner, PT OT Present: Lillia Corporal, OT SLP Present: Charolett Bumpers, SLP PPS Coordinator present : Gunnar Fusi, SLP     Current Status/Progress Goal Weekly Team Focus  Bowel/Bladder   Pt is currently incont of Bladder. LBM is 07/18/20.  Pt to gain cont of bladder and regularity of bowel.  [place] pt q2h and utlize PRN constipation relieving methods as needed   Swallow/Nutrition/ Hydration             ADL's   fluctating performance - was CGA at eval now Mod for power up, Min stand pivot with RW, Mod LB dress, aphasia limiting at times  Supervision  standing balance, safety awareness, ADL retraining, family ed/training, ADL transfers, R side NMR   Mobility   bed mobility mod -max A; transfers mod A-max A; gait was 300' CGA-min A no AD now 39' mod A with RW  supervision  increased safety awareness, attention to task, balance, transfers, gait, bed mobility   Communication   Mod A  minA expressive and receptive language basic level   naming, phrase description and awareness of errors   Safety/Cognition/ Behavioral Observations  min A - focused on language this reporting period  supervision basic safety/awareness  basic problem solving and error awareness   Pain   Pt states no pain  Pt remains pain free  Assess for pain qshift and offer pain relieving methods as needed.   Skin   Skin is overall clean and intact. Some redness in groin d/t incont.  Skin remains clean and inatct with no breakdown  Assess skin qshift and provide skin care. Keep skin dry and clean to prevent MASD.     Discharge Planning:      Team Discussion: Possibly constipated, may have PNA, started Amantadine. Incontinent B/B, has redness to the penis. Wife may not be able to care for the patient at home. Son and daughter will be able to assist. Patient on target to meet rehab goals: no, mod assist for standing and transfers. Aphasic, has supervision goals. Had a decline over the weekend, not able to walk during session today, was walking > 300 ft. Processing has worsened, initiation is poor. SLP reports that he is improving at noticing errors, but with what OT and PT are saying, they will look more into receptive.  *See Care Plan and progress notes for long and short-term goals.   Revisions to Treatment Plan:  Checking for possible PNA, may need a KUB.  Teaching Needs: Family education, medication management, skin/wound care,  transfer training, gait training, balance training, endurance training, safety awareness.  Current Barriers to Discharge: Decreased caregiver support, Medical stability, Home enviroment access/layout, New diabetic, Incontinence, Wound care, Lack of/limited family support, Weight, Weight bearing restrictions, Medication compliance and Behavior  Possible Resolutions to Barriers: Continue current medications, provide emotional support.     Medical Summary Current Status: sedated;  no pain; redness to the penis?;   LBM 5/18-  incontinent of bladder/bowel; WBC is 12.5- Cr also slightly more elevated; K+ went from 3.8 to 5.3.  Barriers to Discharge: Decreased family/caregiver support;Behavior;Home enviroment access/layout;Incontinence;Medical stability;New diabetic;Weight bearing restrictions;Weight;Other (comments)  Barriers to Discharge Comments: supposed to d/c home with wife; son/daughter- however might not be able to go home?- has significant aphasia. Possible Resolutions to [employer]: focus- recheck K+ for 5.3; recheck WBC and checking Mycoplasma Antigen for possible walking pneumonia; adding amantadine for sedation/aphasia; per therapy more sedated than admission; concern that "pneumonia" could be cause of initiation/processing and sedation/lethargy- more sorbitol for constipation- and if doesn't improve, will needs Mg citrate/KUB.  d/c 07/09/20   Continued Need for Acute Rehabilitation Level of Care: The patient requires daily medical management by a physician with specialized training in physical medicine and rehabilitation for the following reasons: Direction of a multidisciplinary physical rehabilitation program to maximize functional independence : Yes Medical management of patient stability for increased activity during participation in an intensive rehabilitation regime.: Yes Analysis of laboratory values and/or radiology reports with any subsequent need for medication adjustment and/or medical intervention. : Yes   I attest that I was present, lead the team conference, and concur with the assessment and plan of the team.   Cristi Loron 07/24/2020, 4:41 PM

## 2020-07-24 NOTE — Progress Notes (Signed)
Physical Therapy Session Note  Patient Details  Name: Edwin Wall MRN: [id number] Date of Birth: 1941-01-05  Today's Date: 07/24/2020 PT Individual Time: 0900-1000 PT Individual Time Calculation (min): 60 min   Short Term Goals: Week 1:  PT Short Term Goal 1 (Week 1): pt to demonstrate supine<>sit CGA PT Short Term Goal 2 (Week 1): pt to demonstrate functional transfers CGA PT Short Term Goal 3 (Week 1): pt to demonstrate gait 200' CGA with good awareness of Rt side  Skilled Therapeutic Interventions/Progress Updates:    pt received in bed and agreeable to therapy. Pt did not have clothing present and PT retrieved paper top and pants. Pt min A for donning shirt at bed level and max A for donning pants. In putting pants on, PT noticed pt's brief was soiled and pt unaware and incontinent of urine. Pt required max VC for sequencing and processing to return to bed for rolling to remove brief and don a clean one total A for this and mod A for rolling with max VC. Pt then returned to sitting EOB mod A for trunk support. Pt directed in Sit to stand max A from slightly elevated bed height to Rolling walker to pull pants over hips. In standing, pt pointed to recliner and said "I want to sit there". PT setup space for this quickly and directed pt in x4 steps to recliner with Rolling walker mod A for stability and VC for sequencing. Once sitting pt reported he needed to have BM and wanted to sit on toilet. Pt directed in Stand pivot transfer to [facility] however pt unable to ascend from recliner, multiple attempts made and pt demonstrated poor initiation, sequencing and ability to activate BLE and glutes to come to standing with all mobility attempts coming from trunk and BUE, max VC and tactile cues given with extra time to complete and pt unable to clear recliner. Stedy brought to room to assist and pt unable to process how to use Stedy despite VC and demonstration and multiple attempts. Second helper  brought to room to assist and max A x2 to stedy pt able to achieve standing in stedy, then used stedy to transfer back to sitting EOB, min A for sit>supine. Pt then reported he needed to have BM, pt aware and retrieved bed pan as pt inappropriate 2/2 safety concerns to attempt toilet transfer at this time. Pt max A for brief and pants management and mod A for rolling to place bed pan. Pt left on bed pan, wife present and nursing aware. All needs in reach and in good condition. Call light in hand.    Therapy Documentation Precautions:  Precautions Precautions: Fall Precaution Comments: R side inattention Restrictions Weight Bearing Restrictions: No General:   Vital Signs:  Pain:   Mobility:   Locomotion :    Trunk/Postural Assessment :    Balance:   Exercises:   Other Treatments:      Therapy/Group: Individual Therapy  Junie Panning 07/24/2020, 12:12 PM

## 2020-07-24 NOTE — Progress Notes (Signed)
Occupational Therapy Session Note  Patient Details  Name: Edwin Wall MRN: [id number] Date of Birth: 1941/01/29  Today's Date: 07/24/2020 OT Individual Time: 7903-8333 OT Individual Time Calculation (min): 70 min    Short Term Goals: Week 1:  OT Short Term Goal 1 (Week 1): STGs = LTGs   Skilled Therapeutic Interventions/Progress Updates:    Pt greeted at time of session semireclined in bed resting with wife present who remained throughout session. Donning TED hose dependently when MD entered at this time, sharing that pt likely has walking PNA, going to obtain sputum sample. RN entered as well and confirmed with OT how to obtain in sample cup if able. Pt supine > sit Max A for LE management and trunk, able to perform sit <> stands x3 trials with only one trial successful to come up into standing with Max A, able to remain standing approx 20 seconds. BP checked seated and WNL. Redirected session from plans to go to gym, to focus on sit <> stands and body mechanics, obtained stedy and multiple attempts made for sit <> stand in stedy but pt unable. Requiring extensive rest breaks in between sets and max multimodal cues for sequencing, tactile cues for muscle activation, and pt unable to perform. Sitting EOB to wash face with supervision, trying to wash feet at one point. Sit > supine Max A, scooting up in bed same manner. Alarm on call bell inreach.   Therapy Documentation Precautions:  Precautions Precautions: Fall Precaution Comments: R side inattention Restrictions Weight Bearing Restrictions: No     Therapy/Group: Individual Therapy  [name] 07/24/2020, 7:26 AM

## 2020-07-24 NOTE — Progress Notes (Signed)
.   Pt did not have productive cough throughout shift. Unable to collect sputum culture. Reported off to oncoming staff.   Gerald Stabs, RN

## 2020-07-24 NOTE — Progress Notes (Addendum)
PROGRESS NOTE   Subjective/Complaints:  Says he's "fine". Wants to go back to sleep- saw at 7am.  Denies constipation.      ROS: limited by aphasia, sedation/cognition  Objective:   CT HEAD WO CONTRAST  Result Date: 07/22/2020 CLINICAL DATA:  80 year old male with altered mental status. Recent code stroke presentation and anterior left MCA territory infarct. EXAM: CT HEAD WITHOUT CONTRAST TECHNIQUE: Contiguous axial images were obtained from the base of the skull through the vertex without intravenous contrast. COMPARISON:  Head CT 07/17/2020, brain MRI 07/15/2020. FINDINGS: Brain: Evolving anterior left MCA division infarct with decreased regional mass effect since 07/17/2020. Possible petechial hemorrhage. No malignant hemorrhagic transformation. Elsewhere stable gray-white matter differentiation throughout the brain. No new cortically based infarct. No midline shift or ventriculomegaly. Vascular: Mild Calcified atherosclerosis at the skull base. No suspicious intracranial vascular hyperdensity. Skull: No acute osseous abnormality identified. Sinuses/Orbits: Stable paranasal sinuses. Chronic right sphenoid sinusitis. Stable mild mastoid effusions. Other: Visualized orbits and scalp soft tissues are within normal limits. IMPRESSION: 1. Expected evolution of anterior Left MCA division infarct. Possible petechial hemorrhage but no malignant hemorrhagic transformation and mildly decreased regional mass effect since 07/17/2020. 2. No new intracranial abnormality. Electronically Signed   By: Genevie Ann M.D.   On: 07/22/2020 11:10   DG CHEST PORT 1 VIEW  Result Date: 07/23/2020 CLINICAL DATA:  WBC increasing, leukocytosis EXAM: PORTABLE CHEST 1 VIEW COMPARISON:  None. FINDINGS: Cardiomegaly. Low volume AP portable examination with mild, diffuse interstitial pulmonary opacity. The visualized skeletal structures are unremarkable. IMPRESSION: 1. Low  volume AP portable examination with mild, diffuse interstitial pulmonary opacity, which may reflect edema or atypical/viral infection but may be exaggerated by low volume AP portable technique. No focal airspace opacity. 2. Cardiomegaly. Electronically Signed   By: Eddie Candle M.D.   On: 07/23/2020 17:48   Recent Labs    07/23/20 0616 07/24/20 0427  WBC 12.6* 12.4*  HGB 14.5 16.2  HCT 44.2 50.4  PLT 212 196   Recent Labs    07/23/20 0616 07/24/20 0427  NA 139 140  K 3.8 5.3*  CL 106 106  CO2 25 23  GLUCOSE 174* 145*  BUN 25* 27*  CREATININE 1.68* 1.62*  CALCIUM 9.1 9.2    Intake/Output Summary (Last 24 hours) at 07/24/2020 0847 Last data filed at 07/24/2020 0818 Gross per 24 hour  Intake 653 ml  Output --  Net 653 ml        Physical Exam: Vital Signs Blood pressure (!) 128/94, pulse 91, temperature 98.9 F (37.2 C), temperature source Oral, resp. rate 17, weight 104.1 kg, SpO2 98 %.     General: awake, asleep initially, and went back to sleep before I left the room; NAD HENT: conjugate gaze; oropharynx moist CV: regular rate; no JVD Pulmonary: CTA B/L; no W/R/R- good air movement; slightly decreased at bases GI: soft, NT, ND, (+)BS- hypoactive Psychiatric: sleepy; aphasic Neurological: sleepy compared to yesterday- aphasic.  Skin: Warm and dry.  Intact. Musc: No edema in extremities.  No tenderness in extremities. Aphasia Motor exam remains somewhat limited due to participation, however >/4/5 throughout, appears unchanged  Assessment/Plan: 1. Functional deficits which require  3+ hours per day of interdisciplinary therapy in a comprehensive inpatient rehab setting.  Physiatrist is providing close team supervision and 24 hour management of active medical problems listed below.  Physiatrist and rehab team continue to assess barriers to discharge/monitor patient progress toward functional and medical goals  Care Tool:  Bathing    Body parts bathed by  patient: Right arm,Left arm,Chest,Abdomen,Front perineal area,Buttocks,Left upper leg,Right upper leg,Face     Body parts n/a: Left lower leg,Right lower leg   Bathing assist Assist Level: Minimal Assistance - Patient > 75%     Upper Body Dressing/Undressing Upper body dressing   What is the patient wearing?: Pull over shirt    Upper body assist Assist Level: Contact Guard/Touching assist (standing)    Lower Body Dressing/Undressing Lower body dressing      What is the patient wearing?: Pants     Lower body assist Assist for lower body dressing: Moderate Assistance - Patient 50 - 74%     Toileting Toileting    Toileting assist Assist for toileting: Maximal Assistance - Patient 25 - 49%     Transfers Chair/bed transfer  Transfers assist     Chair/bed transfer assist level: Minimal Assistance - Patient > 75%     Locomotion Ambulation   Ambulation assist      Assist level: Minimal Assistance - Patient > 75% Assistive device: No Device Max distance: 75   Walk 10 feet activity   Assist     Assist level: Minimal Assistance - Patient > 75% Assistive device: No Device   Walk 50 feet activity   Assist    Assist level: Minimal Assistance - Patient > 75% Assistive device: No Device    Walk 150 feet activity   Assist Walk 150 feet activity did not occur: Safety/medical concerns         Walk 10 feet on uneven surface  activity   Assist Walk 10 feet on uneven surfaces activity did not occur: Safety/medical concerns         Wheelchair     Assist Will patient use wheelchair at discharge?: Yes (Per PT long term goals) Type of Wheelchair: Manual    Wheelchair assist level: Moderate Assistance - Patient 50 - 74% Max wheelchair distance: 150    Wheelchair 50 feet with 2 turns activity    Assist        Assist Level: Moderate Assistance - Patient 50 - 74%   Wheelchair 150 feet activity     Assist      Assist Level:  Moderate Assistance - Patient 50 - 74%   Blood pressure (!) 128/94, pulse 91, temperature 98.9 F (37.2 C), temperature source Oral, resp. rate 17, weight 104.1 kg, SpO2 98 %.  Medical Problem List and Plan: 1.  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  CT scan showing expected evolution of left MCA infarct with?  Petechial hemorrhage.  5/23- con't PT, OT and SLP  5/24- will start Amantadine 100 mg daily to help with sedation and aphasia?- con't PT, OT and SLP 2.  Antithrombotics: -DVT/anticoagulation: Awaiting plan transition to Eliquis             -antiplatelet therapy: Currently on aspirin 325 mg daily awaiting transition to Eliquis for stroke prevention  5/23- has petechial hemorrhage possibly-  Started on Eliquis Friday- will monitor 3. Pain Management: Tylenol as needed  Controlled on 5/22 4. Mood: Ativan 1 mg daily.  Provide emotional support             -antipsychotic agents: [name] 5. Neuropsych: This patient is? capable of making decisions on his own behalf. 6. Skin/Wound Care: Routine skin checks 7. Fluids/Electrolytes/Nutrition: Routine in and outs 8.  Hypertension  Relatively controlled on 5/22             Monitor increase activity 9.  Hyperlipidemia.  Crestor/Colestid 10.  New onset atrial fibrillation.  Cardiac rate controlled.  Await plan to begin Eliquis  5/23- on Eliquis- stable rhythm/irregular- con't regimen             Monitor with increased activity 11.  Diabetes mellitus type 2 with hyperglycemia.  Hemoglobin A1c 6.6 2018 and 7.1 on this admission.  He was on no glucose lowering agents at home.  Currently SSI.  Await plan to possibly begin metformin.  Started Farxiga 5mg  daily per pharmacy   Slightly labile on 5/22, would not make any further adjustments  today  5/23- somewhat labile, but will try to increase [place] tomorrow to 10 mg daily.   5/24- increased to 10 mg daily- will monitor             Monitor increase mobility 12.  History of colon cancer with colectomy 2018.  Follow-up outpatient 13.  Obesity.  BMI 36.24.  Dietary follow-up 14.  AKI/CKD3B.    Creatinine 1.68 on 5/19, labs ordered for tomorrow  5/23- Cr 1.68- stable- con't to monitor  Continue to monitor 15.  Decreased function - patient likely wax/wanes as previously noted to be extremely somnolent on acute floor.   CT head ordered, essentially showing expected evolution.  WBCs elevated at 11.5 on 5/22, however this appears to be within patient's range.  No fevers or signs/symptoms of infection.  UA/Ucx pending  Blood cultures ordered  5/23- U/A (-) except small Hb. No growth on blood Cx's so far- WBC up to 12.6- will also check CXR and looks better today- so will monitor and recheck in AM  5/24- WBC stable at 12.4- UCx (-); CXR shows some possible opacity diffuse/mild- atypical? Will call IM to help out.  Blood Cx's (-) so far.   16. Constipation  5/23- will order sorbitol if hasn't ha BM- documented in computer 5 days ago?  5/24- no BM documented- will give another 60cc of Sorbitol- if no results, will check KUB/Mg citrate?   I spent a total of 35 minutes on pt- >50% coordination of care; Called IM to see if there was anything I could do/should do about WBC- as well as went over CXR and Cx's - suggested mycoplasma antigen, so I ordered it/the Cx.    LOS: 6 days A FACE TO FACE EVALUATION WAS PERFORMED  Calin Ellery 07/24/2020, 8:47 AM

## 2020-07-24 NOTE — Progress Notes (Signed)
Patient ID: Edwin Wall, male   DOB: [birth year], 80 y.o.   MRN: [id number] Team Conference Report to Patient/Family  Team Conference discussion was reviewed with the patient and caregiver, including goals, any changes in plan of care and target discharge date.  Patient and caregiver express understanding and are in agreement.  The patient has a target discharge date of 08/09/20.   Covering for primary SW, Becky. SW provided conference updates to pt and spouse. Pt currently has pending X-Ray. Discussed pt having decline. No additional questions or concerns, sw will cont to to follow up.  Dyanne Iha   07/24/2020, 1:49 PM

## 2020-07-24 NOTE — Progress Notes (Signed)
Occupational Therapy Session Note  Patient Details  Name: Edwin Wall MRN: [id number] Date of Birth: 1941/02/25  Today's Date: 07/24/2020 OT Individual Time: 1040-1106 OT Individual Time Calculation (min): 26 min    Short Term Goals: Week 1:  OT Short Term Goal 1 (Week 1): STGs = LTGs   Skilled Therapeutic Interventions/Progress Updates:    Pt supine with no c/o pain at rest but reporting he has been incontinent of urine. Pt's wife present for session. Pt completed hip bridging to remove brief, requiring min A to bring hips 25% off the bed. Pt rolled R and L with min A to don new brief. Pt then reported he needed to urinate again. He was encouraged to come EOB and required mod A to do so. Attempted to stand and pt was unable with max A. Pt had to return to supine for brief change. Max A for brief change. Pt reporting pain in his toes and radiating up his legs anteriorly. Pt was left supine with all needs met, bed alarm set.   Therapy Documentation Precautions:  Precautions Precautions: Fall Precaution Comments: R side inattention Restrictions Weight Bearing Restrictions: No   Therapy/Group: Individual Therapy  Curtis Sites 07/24/2020, 12:44 PM

## 2020-07-25 LAB — GLUCOSE, CAPILLARY
Glucose-Capillary: 143 mg/dL — ABNORMAL HIGH (ref 70–99)
Glucose-Capillary: 143 mg/dL — ABNORMAL HIGH (ref 70–99)
Glucose-Capillary: 145 mg/dL — ABNORMAL HIGH (ref 70–99)
Glucose-Capillary: 149 mg/dL — ABNORMAL HIGH (ref 70–99)

## 2020-07-25 MED ORDER — AZITHROMYCIN 500 MG PO TABS
500.0000 mg | ORAL_TABLET | Freq: Every day | ORAL | Status: AC
  Administered 2020-07-25: 500 mg via ORAL
  Filled 2020-07-25: qty 1

## 2020-07-25 MED ORDER — AZITHROMYCIN 250 MG PO TABS
250.0000 mg | ORAL_TABLET | Freq: Every day | ORAL | Status: AC
  Administered 2020-07-26 – 2020-07-29 (×4): 250 mg via ORAL
  Filled 2020-07-25 (×5): qty 1

## 2020-07-25 NOTE — Progress Notes (Signed)
Inpatient Diabetes Program Recommendations  AACE/ADA: New Consensus Statement on Inpatient Glycemic Control   Target Ranges:  Prepandial:   less than 140 mg/dL      Peak postprandial:   less than 180 mg/dL (1-2 hours)      Critically ill patients:  140 - 180 mg/dL   Results for HERSON, PRICHARD (MRN [id number]) as of 07/25/2020 11:58  Ref. Range 07/24/2020 05:57 07/24/2020 11:53 07/24/2020 17:14 07/25/2020 06:27  Glucose-Capillary Latest Ref Range: 70 - 99 mg/dL 161 (H) 164 (H) 227 (H) 143 (H)  Results for ALISTER, STAVER (MRN [id number]) as of 07/25/2020 11:58  Ref. Range 12/15/2016 11:14 07/16/2020 03:37  Hemoglobin A1C Latest Ref Range: 4.8 - 5.6 % 6.6 (H) 7.1 (H)   Review of Glycemic Control  Diabetes history: Prediabetes Outpatient Diabetes medications: None Current orders for Inpatient glycemic control: Novolog 0-15 units TID with meals, Farxiga 10 mg daily (dose increased from 5 mg to 10 mg starting 07/25/20)  Inpatient Diabetes Program Recommendations:    Diet: If appropriate, could add Carb Modified to Dys 3 diet.  HbgA1C: Noted A1C 7.1% on 07/16/20 indicating an average glucose of 157 mg/dl over the past 2-3 months. Given patient is 80 years old and with CVA, A1C goals will likely be 7.5-8%.   NOTE: Noted consult for diabetes coordinator. Chart reviewed. Noted patient seen PCP on 04/17/20 and lab glucose was noted to be 152 mg/dl at that time. Prior A1C 6.6% on 12/15/2016 and was 7.1% on 07/16/20. Farxiga 5 mg daily was started on 07/19/20 and increased to 10 mg on 07/24/20 and increased dose was started today. Would not recommend any further changes with DM medications at this time. Given A1C of 7.1%, patient is 80 years old, and has had recent CVA would not try to have tight glycemic control as it may increase risk of hypoglycemia.  If glucose remains in target goals of 140-180 mg/dl overall, could discharge on Farxgia and have patient follow up with PCP regarding glycemic  control.  Thanks, Barnie Alderman, RN, MSN, CDE Diabetes Coordinator Inpatient Diabetes Program [phone] (Team Pager from 8am to 5pm)

## 2020-07-25 NOTE — Progress Notes (Signed)
Speech Language Pathology Daily Session Note  Patient Details  Name: Edwin Wall MRN: [id number] Date of Birth: 11/01/1940  Today's Date: 07/25/2020 SLP Individual Time: 0805-0900 SLP Individual Time Calculation (min): 55 min  Short Term Goals: Week 1: SLP Short Term Goal 1 (Week 1): Patient will name at least 5 objects in category with min-modA cues. SLP Short Term Goal 2 (Week 1): Patient will describe object function for common objects, at phrase level with minA cues. SLP Short Term Goal 3 (Week 1): Patient will describe action pictures/photos at phrase level with minA cues. SLP Short Term Goal 4 (Week 1): Patient demonstrate awareness to errors when given modA cues during highly structured language tasks. SLP Short Term Goal 5 (Week 1): Patient will perform two-step commands/instructions with 80% accuracy and modA cues.  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 He verbally identified opposites (hot-cold,etc) with 70% accuracy and verbally described action/verb photos at 2-word phrase level with 80% accuracy and minimal cues. During expressive writing task, his accuracy with writing phrases of more than 2 words to describe was approximately 40% and with no awareness to errors, even when SLP read errors aloud to him. He was not able to name any objects during divergent naming task but was able to name objects when features described, "yellow fruit that monkeys eat", etc. He was able to write his full name and address accurately with minimal cues to initiate. He wrote to dictation at word level with min-modA cues. Patient continues to benefit from skilled SLP intervention to maximize aphasia and cognitive goals prior to discharge.  Pain Pain Assessment Pain Scale: 0-10 Pain Score: 0-No pain  Therapy/Group: Individual Therapy   Sonia Baller, MA, CCC-SLP Speech Therapy

## 2020-07-25 NOTE — Progress Notes (Signed)
Pt was NTS per MD order. RT got a very little sputum sample with the NTS. No complications noted.

## 2020-07-25 NOTE — Progress Notes (Signed)
PROGRESS NOTE   Subjective/Complaints:  Pt more awake this AM- hasn't been coughing per pt.  Had 2 BMs in last 24 hours.   Nursing wasn't able to get sputum Cx yesterday and o/n.   ROS: limited by cognition/aphasia  Objective:   DG CHEST PORT 1 VIEW  Result Date: 07/23/2020 CLINICAL DATA:  WBC increasing, leukocytosis EXAM: PORTABLE CHEST 1 VIEW COMPARISON:  None. FINDINGS: Cardiomegaly. Low volume AP portable examination with mild, diffuse interstitial pulmonary opacity. The visualized skeletal structures are unremarkable. IMPRESSION: 1. Low volume AP portable examination with mild, diffuse interstitial pulmonary opacity, which may reflect edema or atypical/viral infection but may be exaggerated by low volume AP portable technique. No focal airspace opacity. 2. Cardiomegaly. Electronically Signed   By: Eddie Candle M.D.   On: 07/23/2020 17:48   Recent Labs    07/23/20 0616 07/24/20 0427  WBC 12.6* 12.4*  HGB 14.5 16.2  HCT 44.2 50.4  PLT 212 196   Recent Labs    07/23/20 0616 07/24/20 0427  NA 139 140  K 3.8 5.3*  CL 106 106  CO2 25 23  GLUCOSE 174* 145*  BUN 25* 27*  CREATININE 1.68* 1.62*  CALCIUM 9.1 9.2    Intake/Output Summary (Last 24 hours) at 07/25/2020 0746 Last data filed at 07/24/2020 1854 Gross per 24 hour  Intake 600 ml  Output --  Net 600 ml        Physical Exam: Vital Signs Blood pressure 115/76, pulse 86, temperature 98.6 F (37 C), resp. rate 17, weight 104.1 kg, SpO2 95 %.      General: awake, alert, appropriate, much more awake today; watching TV;  NAD HENT: conjugate gaze; oropharynx moist CV: regular rate- irregular; no JVD Pulmonary: Clear, but decreased R>L bases- no W/R/R GI: soft, NT, ND, (+)BS Psychiatric: more interactive Neurological: aphasic; slightly more speech this AM  Skin: Warm and dry.  Intact. Musc: No edema in extremities.  No tenderness in  extremities. Aphasia Motor exam remains somewhat limited due to participation, however >/4/5 throughout, appears unchanged  Assessment/Plan: 1. Functional deficits which require 3+ hours per day of interdisciplinary therapy in a comprehensive inpatient rehab setting.  Physiatrist is providing close team supervision and 24 hour management of active medical problems listed below.  Physiatrist and rehab team continue to assess barriers to discharge/monitor patient progress toward functional and medical goals  Care Tool:  Bathing    Body parts bathed by patient: Right arm,Left arm,Chest,Abdomen,Front perineal area,Buttocks,Left upper leg,Right upper leg,Face     Body parts n/a: Left lower leg,Right lower leg   Bathing assist Assist Level: Minimal Assistance - Patient > 75%     Upper Body Dressing/Undressing Upper body dressing   What is the patient wearing?: Pull over shirt    Upper body assist Assist Level: Contact Guard/Touching assist (standing)    Lower Body Dressing/Undressing Lower body dressing      What is the patient wearing?: Pants     Lower body assist Assist for lower body dressing: Moderate Assistance - Patient 50 - 74%     Toileting Toileting    Toileting assist Assist for toileting: Maximal Assistance - Patient 25 -  49%     Transfers Chair/bed transfer  Transfers assist     Chair/bed transfer assist level: Minimal Assistance - Patient > 75%     Locomotion Ambulation   Ambulation assist      Assist level: Minimal Assistance - Patient > 75% Assistive device: No Device Max distance: 75   Walk 10 feet activity   Assist     Assist level: Minimal Assistance - Patient > 75% Assistive device: No Device   Walk 50 feet activity   Assist    Assist level: Minimal Assistance - Patient > 75% Assistive device: No Device    Walk 150 feet activity   Assist Walk 150 feet activity did not occur: Safety/medical concerns         Walk  10 feet on uneven surface  activity   Assist Walk 10 feet on uneven surfaces activity did not occur: Safety/medical concerns         Wheelchair     Assist Will patient use wheelchair at discharge?: Yes (Per PT long term goals) Type of Wheelchair: Manual    Wheelchair assist level: Moderate Assistance - Patient 50 - 74% Max wheelchair distance: 150    Wheelchair 50 feet with 2 turns activity    Assist        Assist Level: Moderate Assistance - Patient 50 - 74%   Wheelchair 150 feet activity     Assist      Assist Level: Moderate Assistance - Patient 50 - 74%   Blood pressure 115/76, pulse 86, temperature 98.6 F (37 C), resp. rate 17, weight 104.1 kg, SpO2 95 %.  Medical Problem List and Plan: 1.  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  CT scan showing expected evolution of left MCA infarct with?  Petechial hemorrhage.  5/25- con't PT, OT and SLP- and Amantadine- for alertness-  2.  Antithrombotics: -DVT/anticoagulation: Awaiting plan transition to Eliquis             -antiplatelet therapy: Currently on aspirin 325 mg daily awaiting transition to Eliquis for stroke prevention  5/23- has petechial hemorrhage possibly-  Started on Eliquis Friday- will monitor 3. Pain Management: Tylenol as needed  Controlled on 5/22 4. Mood: Ativan 1 mg daily.  Provide emotional support             -antipsychotic agents: [name] 5. Neuropsych: This patient is? capable of making decisions on his own behalf. 6. Skin/Wound Care: Routine skin checks 7. Fluids/Electrolytes/Nutrition: Routine in and outs 8.  Hypertension  Relatively controlled on 5/22             Monitor increase activity 9.  Hyperlipidemia.  Crestor/Colestid 10.  New onset atrial fibrillation.   Cardiac rate controlled.  Await plan to begin Eliquis  5/23- on Eliquis- stable rhythm/irregular- con't regimen             Monitor with increased activity 11.  Diabetes mellitus type 2 with hyperglycemia.  Hemoglobin A1c 6.6 2018 and 7.1 on this admission.  He was on no glucose lowering agents at home.  Currently SSI.  Await plan to possibly begin metformin.  Started Farxiga 5mg  daily per pharmacy   Slightly labile on 5/22, would not make any further adjustments today  5/23- somewhat labile, but will try to increase [place]  tomorrow to 10 mg daily.   5/24- increased to 10 mg daily- will monitor  5/25- BGs still an issue- will call DM coordinators- family says they wouldn't be able to do insulin easily.              Monitor increase mobility 12.  History of colon cancer with colectomy 2018.  Follow-up outpatient 13.  Obesity.  BMI 36.24.  Dietary follow-up 14.  AKI/CKD3B.    Creatinine 1.68 on 5/19, labs ordered for tomorrow  5/23- Cr 1.68- stable- con't to monitor  Continue to monitor 15.  Decreased function - patient likely wax/wanes as previously noted to be extremely somnolent on acute floor.   CT head ordered, essentially showing expected evolution.  WBCs elevated at 11.5 on 5/22, however this appears to be within patient's range.  No fevers or signs/symptoms of infection.  UA/Ucx pending  Blood cultures ordered  5/23- U/A (-) except small Hb. No growth on blood Cx's so far- WBC up to 12.6- will also check CXR and looks better today- so will monitor and recheck in AM  5/24- WBC stable at 12.4- UCx (-); CXR shows some possible opacity diffuse/mild- atypical? Will call IM to help out.  Blood Cx's (-) so far.    5/25- blood Cx's still negative- spoke with IM and they agree could be atypical walking pneumonia wife mentioned that was coughing for 1 month prior to admission. Trying to get resp Cx- have asked if Respiratory can be called to get it for [place] since not coughing now. Labs in AM- will  try Zpack 500 mg x1 and then 4 days of 250 mg since getting Cx so hard and this is only things that makes sense, based on CXR.   16. Constipation  5/23- will order sorbitol if hasn't ha BM- documented in computer 5 days ago?  5/24- no BM documented- will give another 60cc of Sorbitol- if no results, will check KUB/Mg citrate?  5/25- had 2 BMs yesterday- will con't to monitor  I spent a total of 35 minutes on care- >50% coordination of care- calling pharmacy and IM about plan and DM coordinator.    LOS: 7 days A FACE TO FACE EVALUATION WAS PERFORMED  Dannie Woolen 07/25/2020, 7:46 AM

## 2020-07-25 NOTE — Progress Notes (Signed)
Physical Therapy Session Note  Patient Details  Name: Edwin Wall MRN: [id number] Date of Birth: 1940/09/18  Today's Date: 07/25/2020 PT Individual Time: 1107-1200 PT Individual Time Calculation (min): 53 min   Short Term Goals: Week 1:  PT Short Term Goal 1 (Week 1): pt to demonstrate supine<>sit CGA PT Short Term Goal 2 (Week 1): pt to demonstrate functional transfers CGA PT Short Term Goal 3 (Week 1): pt to demonstrate gait 200' CGA with good awareness of Rt side  Skilled Therapeutic Interventions/Progress Updates:     Pt received supine in bed and agrees to therapy. No complaint of pain. PT threads pants over each leg. Pt bridges in supine and able to pull up pants with minA. Supine to sit with bed features and verbal cues on sequencing. PT assists to put on belt in sitting and pt loses balance posteriorly several times, posteriorly. Pt requires modA to return to upright and midline orientation. Sit to stand with modA HHA and stand step to [facility] with multimodal cues on sequencing and postioning. WC transport to gym for time management. Pt performs NMR in parallel bars. Multiple reps of sit to stand with minA and cues for anterior weight translation. Initially pt marches in place with cues to increase step height and hip flexor activation. Pt progresses to ambulates forward and backward x5' each with minA at hips, with cues for upright gaze to improve posture and balance. Pt then performs alternating toe taps on 4 inch step, initially with bilateral upper extremity support and progressing to only R upper extremity support. Pt performs x7 step ups onto platform with minA at hips. Seated rest breaks taken throughout activity. WC transport back to room. Left seated in WC with alarm intact and all needs within reach.  Therapy Documentation Precautions:  Precautions Precautions: Fall Precaution Comments: R side inattention Restrictions Weight Bearing Restrictions: No   Therapy/Group:  Individual Therapy  Breck Coons, PT, DPT 07/25/2020, 4:27 PM

## 2020-07-25 NOTE — Progress Notes (Signed)
Pt unable still has not been able to provide sputum sample. Will report to oncoming RN.

## 2020-07-25 NOTE — Progress Notes (Signed)
Physical Therapy Session Note  Patient Details  Name: Edwin Wall MRN: [id number] Date of Birth: Feb 28, 1941  Today's Date: 07/25/2020 PT Individual Time: 1345-1415 PT Individual Time Calculation (min): 30 min   Short Term Goals: Week 1:  PT Short Term Goal 1 (Week 1): pt to demonstrate supine<>sit CGA PT Short Term Goal 2 (Week 1): pt to demonstrate functional transfers CGA PT Short Term Goal 3 (Week 1): pt to demonstrate gait 200' CGA with good awareness of Rt side Week 2:     Skilled Therapeutic Interventions/Progress Updates:  PAIN Pt denies pain this pm. Pt initially resting supine, alert, agreeable to session.   Pt dons pants in supine, therapist threads feet/pt lifts feet then pt reaises w/min assist via rolling and bridging without cues, additional time.  Pt supine to sit w/mod assist and cues for sequencing. Static sit w/supervision Sit to stand from elevated bed w/min assist, spt to wc w RW w/min assist. Pt transported to gym Sit to stand wRW w/min assist.  Gait 80ft w/RW w/min assist, wc follow for safety, good cadence, no significant deviations outside of downward gaze, overall mildly flexed trunk.  Requires gestural cues at times due to aphasia Balance: Sit to stand from wc to RW, steps onto 3in foam w/cga w/rw Standing on foam w/RW cga Standing on foam w/single UE on RW - cga to min assist, mild R tendency Standing on foam w/no UE support - gradually increasing R/post lean, min assist for safety. At end of session pt transported to room. Pt left oob in wc w/alarm belt set and needs in reach  Therapy Documentation Precautions:  Precautions Precautions: Fall Precaution Comments: R side inattention Restrictions Weight Bearing Restrictions: No General:   Vital Signs: Therapy Vitals Temp: 97.8 F (36.6 C) Pulse Rate: (!) 59 Resp: 16 BP: (!) 141/77 Patient Position (if appropriate): Sitting Oxygen Therapy SpO2: 98 % O2 Device: Room  Air Pain: Pain Assessment Pain Scale: 0-10 Pain Score: 0-No pain Mobility:   Locomotion :    Trunk/Postural Assessment :    Balance:   Exercises:   Other Treatments:      Therapy/Group: Individual Therapy  Callie Fielding, [place] 07/25/2020, 4:02 PM

## 2020-07-25 NOTE — Progress Notes (Signed)
Occupational Therapy Session Note  Patient Details  Name: Edwin Wall MRN: [id number] Date of Birth: 02-21-1941  Today's Date: 07/25/2020 OT Individual Time: 7493-5521 OT Individual Time Calculation (min): 39 min    Short Term Goals: Week 1:  OT Short Term Goal 1 (Week 1): STGs = LTGs   Skilled Therapeutic Interventions/Progress Updates:    Pt greeted at time of session sitting up in wheelchair agreeable to OT session no pain and declined ADL. Wheelchair transport to gym dependent for time and pt CGA for sit <> stands consistently. Focused session on standing balance and tolerance, able to stand for approx 8-10 minutes with unilateral support and at times unsupported for card matching game, single leg stance for small marches, and attempted high knees but unable today. Stand pivot wheelchair > mat CGA with RW and therapy ball rolls 1x15 with static hold and reach + overhead press 1x15 as well. Transport back to room and pt requesting to go to bed, but when asked to transfer becoming slightly upset saying he wanted to sit up in chair. Alarm on call bell in reach. Aphasia and sequencing limiting.   Therapy Documentation Precautions:  Precautions Precautions: Fall Precaution Comments: R side inattention Restrictions Weight Bearing Restrictions: No    Therapy/Group: Individual Therapy  [name] 07/25/2020, 12:44 PM

## 2020-07-26 LAB — CBC WITH DIFFERENTIAL/PLATELET
Abs Immature Granulocytes: 0.04 10*3/uL (ref 0.00–0.07)
Basophils Absolute: 0.1 10*3/uL (ref 0.0–0.1)
Basophils Relative: 1 %
Eosinophils Absolute: 0.1 10*3/uL (ref 0.0–0.5)
Eosinophils Relative: 1 %
HCT: 44.6 % (ref 39.0–52.0)
Hemoglobin: 14.3 g/dL (ref 13.0–17.0)
Immature Granulocytes: 0 %
Lymphocytes Relative: 20 %
Lymphs Abs: 2.1 10*3/uL (ref 0.7–4.0)
MCH: 30.4 pg (ref 26.0–34.0)
MCHC: 32.1 g/dL (ref 30.0–36.0)
MCV: 94.7 fL (ref 80.0–100.0)
Monocytes Absolute: 1.1 10*3/uL — ABNORMAL HIGH (ref 0.1–1.0)
Monocytes Relative: 10 %
Neutro Abs: 7.4 10*3/uL (ref 1.7–7.7)
Neutrophils Relative %: 68 %
Platelets: 258 10*3/uL (ref 150–400)
RBC: 4.71 MIL/uL (ref 4.22–5.81)
RDW: 12.5 % (ref 11.5–15.5)
WBC: 10.8 10*3/uL — ABNORMAL HIGH (ref 4.0–10.5)
nRBC: 0 % (ref 0.0–0.2)

## 2020-07-26 LAB — BASIC METABOLIC PANEL
Anion gap: 10 (ref 5–15)
BUN: 32 mg/dL — ABNORMAL HIGH (ref 8–23)
CO2: 25 mmol/L (ref 22–32)
Calcium: 9.1 mg/dL (ref 8.9–10.3)
Chloride: 102 mmol/L (ref 98–111)
Creatinine, Ser: 1.58 mg/dL — ABNORMAL HIGH (ref 0.61–1.24)
GFR, Estimated: 44 mL/min — ABNORMAL LOW (ref >60)
Glucose, Bld: 139 mg/dL — ABNORMAL HIGH (ref 70–99)
Potassium: 4.1 mmol/L (ref 3.5–5.1)
Sodium: 137 mmol/L (ref 135–145)

## 2020-07-26 LAB — GLUCOSE, CAPILLARY
Glucose-Capillary: 140 mg/dL — ABNORMAL HIGH (ref 70–99)
Glucose-Capillary: 140 mg/dL — ABNORMAL HIGH (ref 70–99)
Glucose-Capillary: 124 mg/dL — ABNORMAL HIGH (ref 70–99)
Glucose-Capillary: 157 mg/dL — ABNORMAL HIGH (ref 70–99)

## 2020-07-26 NOTE — Progress Notes (Signed)
Occupational Therapy Session Note  Patient Details  Name: Edwin Wall MRN: [id number] Date of Birth: 02-20-1941  Today's Date: 07/26/2020 OT Individual Time: 6004-5997 and 1500-1525  OT Individual Time Calculation (min): 27 min and 25 min   Short Term Goals: Week 1:  OT Short Term Goal 1 (Week 1): STGs = LTGs   Skilled Therapeutic Interventions/Progress Updates:    Session 1: Pt greeted at time of session supine in bed resting wife present, agreeable to OT session. Declined ADL and toileting. Supine > sit Supervision and walked room <> main gym with CGA and RW, cues for standing in AD and not pushing out in front. Initially seated there ex w/ 5# dowel for: chest press, bicep curl, chest press and transitioning to standing for second set, mirror for feedback throughout. Walked back to room same as above, sit > supine Min A for LEs. Alarm on call bell in reach.    Session 2:Pt greeted at time of session supine in bed resting agreeable to OT session, declined toileting. Supine > sit Supervision with extended time and ambualted room <> ortho gym with CGA using RW cues for remaining close to walker instead of out in front, also to relax shoulders to decrease bearing weight through [place]. Stand pivot <> SCIFIT with CGA, level 3 for 9 minutes for cardio endurance training no complaints. Ambulated gym > room > bed CGA and pt did exhibit decreased safety throughout trying to hold on to walker with one hand on opposite side to reach for newspaper and did so despite instruction. Pt mildly annoyed, but expressive aphasia limiting. Sit > supine Supervision and alarm on call bell in reach.   Therapy Documentation Precautions:  Precautions Precautions: Fall Precaution Comments: R side inattention Restrictions Weight Bearing Restrictions: No    Therapy/Group: Individual Therapy  [name] 07/26/2020, 7:28 AM

## 2020-07-26 NOTE — Progress Notes (Signed)
Physical Therapy Session Note  Patient Details  Name: Edwin Wall MRN: [id number] Date of Birth: [birth year]  Today's Date: 07/26/2020 PT Individual Time: 0800-0915 PT Individual Time Calculation (min): 75 min   Short Term Goals: Week 1:  PT Short Term Goal 1 (Week 1): pt to demonstrate supine<>sit CGA PT Short Term Goal 2 (Week 1): pt to demonstrate functional transfers CGA PT Short Term Goal 3 (Week 1): pt to demonstrate gait 200' CGA with good awareness of Rt side Week 2:     Skilled Therapeutic Interventions/Progress Updates:    pt received in bed and agreeable to therapy. Pt found to have soiled brief at start of session incontinent of urine, total A for brief change and min A for pericare, pt directed in donning pants at bed level mod A with pt able to lateral hip lean to pull over hips. Pt then directed in supine>sit min A for trunk support into sitting at EOB, pt directed in Sit to stand to Rolling walker min A and transferred to [name]. Pt taken to day room total A for time and energy conservation. Pt directed in dynamic standing activities of toe taps to 2 colored cones with pt to tap said foot to said color, pt demonstrated great difficulty with this and frequently attempted to use LLE with RLE being the instructed foot. Pt also confused color matching but when asked able to tell you what color the cones are, pt directed in 2x15. Pt then directed in 2x15 toe tapping at 3" step up with min A for stability with pt able to alternate BLE to tapping with moderate cues to initiate, sequence and then pt able to recognize pattern and complete task. Pt then directed in gait training 30' x2 with Rolling walker at min A with x5 small obstacles at Rt side to step over with RLE, pt able to complete this with minimal VC and only demonstrated hitting objects with final 2 with fatigue. Pt directed in gait training with Rolling walker for 120' CGA-min A and VC for posture and VC. Pt returned rest of  distance to room total A in [name] for energy. Pt then directed in teeth brushing at sink at pt request from [name] level with pt directed in reaching for all objects needs, and to say items names when reaching for them with pt able to complete with extra time. Pt able to complete task with minimal VC and supervision. Pt then requested to end session in chair at mod A for Stand pivot transfer without AD to assess transfer ability without use of AD however continue to recommend Rolling walker. Pt left in recliner, All needs in reach and in good condition. Call light in hand.  and alarm set.   Therapy Documentation Precautions:  Precautions Precautions: Fall Precaution Comments: R side inattention Restrictions Weight Bearing Restrictions: No General:   Vital Signs:   Pain: Pain Assessment Pain Scale: 0-10 Pain Score: 0-No pain Mobility:   Locomotion :    Trunk/Postural Assessment :    Balance:   Exercises:   Other Treatments:      Therapy/Group: Individual Therapy  Junie Panning 07/26/2020, 11:57 AM

## 2020-07-26 NOTE — Progress Notes (Signed)
PROGRESS NOTE   Subjective/Complaints:  They were able to get Mycoplasma Cx yesterday-  Pt says slept well- denies pain overall, but then said had pain in LEs L>R- aching- like arthritis pain? Also c/o tightness in RLE-  "about the same"   RCV:ELFYBOF by cognition/aphasia  Objective:   No results found. Recent Labs    07/24/20 0427 07/26/20 0455  WBC 12.4* 10.8*  HGB 16.2 14.3  HCT 50.4 44.6  PLT 196 258   Recent Labs    07/24/20 0427 07/26/20 0455  NA 140 137  K 5.3* 4.1  CL 106 102  CO2 23 25  GLUCOSE 145* 139*  BUN 27* 32*  CREATININE 1.62* 1.58*  CALCIUM 9.2 9.1    Intake/Output Summary (Last 24 hours) at 07/26/2020 7510 Last data filed at 07/26/2020 0745 Gross per 24 hour  Intake 440 ml  Output --  Net 440 ml        Physical Exam: Vital Signs Blood pressure (!) 131/91, pulse 84, temperature 98.2 F (36.8 C), resp. rate 18, weight 104.1 kg, SpO2 96 %.       General: awake, alert, appropriate, more awake today; more interactive; NAD HENT: conjugate gaze; oropharynx moist CV: regular rate; no JVD Pulmonary: CTA B/L; no W/R/R- good air movement- still decreased at bases B/L GI: soft, NT, ND, (+)BS Psychiatric: brighter but still overall falt affect Neurological: alert, more speech, more awake and trying to interact overall; has MAS of 1 to 1+ in RLE at hip/knee- no clonus Skin: Warm and dry.  Intact. Musc: No edema in extremities.  No tenderness in extremities. Aphasia Motor exam remains somewhat limited due to participation, however >/4/5 throughout, appears unchanged  Assessment/Plan: 1. Functional deficits which require 3+ hours per day of interdisciplinary therapy in a comprehensive inpatient rehab setting.  Physiatrist is providing close team supervision and 24 hour management of active medical problems listed below.  Physiatrist and rehab team continue to assess barriers to  discharge/monitor patient progress toward functional and medical goals  Care Tool:  Bathing    Body parts bathed by patient: Right arm,Left arm,Chest,Abdomen,Front perineal area,Buttocks,Left upper leg,Right upper leg,Face     Body parts n/a: Left lower leg,Right lower leg   Bathing assist Assist Level: Minimal Assistance - Patient > 75%     Upper Body Dressing/Undressing Upper body dressing   What is the patient wearing?: Pull over shirt    Upper body assist Assist Level: Contact Guard/Touching assist (standing)    Lower Body Dressing/Undressing Lower body dressing      What is the patient wearing?: Pants     Lower body assist Assist for lower body dressing: Moderate Assistance - Patient 50 - 74%     Toileting Toileting    Toileting assist Assist for toileting: Maximal Assistance - Patient 25 - 49%     Transfers Chair/bed transfer  Transfers assist     Chair/bed transfer assist level: Minimal Assistance - Patient > 75%     Locomotion Ambulation   Ambulation assist      Assist level: Minimal Assistance - Patient > 75% Assistive device: Parallel bars Max distance: 10'   Walk 10 feet activity  Assist     Assist level: Minimal Assistance - Patient > 75% Assistive device: Parallel bars   Walk 50 feet activity   Assist    Assist level: Minimal Assistance - Patient > 75% Assistive device: Walker-rolling    Walk 150 feet activity   Assist Walk 150 feet activity did not occur: Safety/medical concerns         Walk 10 feet on uneven surface  activity   Assist Walk 10 feet on uneven surfaces activity did not occur: Safety/medical concerns         Wheelchair     Assist Will patient use wheelchair at discharge?: Yes (Per PT long term goals) Type of Wheelchair: Manual    Wheelchair assist level: Moderate Assistance - Patient 50 - 74% Max wheelchair distance: 150    Wheelchair 50 feet with 2 turns activity    Assist         Assist Level: Moderate Assistance - Patient 50 - 74%   Wheelchair 150 feet activity     Assist      Assist Level: Moderate Assistance - Patient 50 - 74%   Blood pressure (!) 131/91, pulse 84, temperature 98.2 F (36.8 C), resp. rate 18, weight 104.1 kg, SpO2 96 %.  Medical Problem List and Plan: 1.  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  CT scan showing expected evolution of left MCA infarct with?  Petechial hemorrhage.  5/26- con't PT, SLP  and OT- appears more awake this AM 2.  Antithrombotics: -DVT/anticoagulation: Awaiting plan transition to Eliquis             -antiplatelet therapy: Currently on aspirin 325 mg daily awaiting transition to Eliquis for stroke prevention  5/23- has petechial hemorrhage possibly-  Started on Eliquis Friday- will monitor 3. Pain Management: Tylenol as needed  5/26- c/o RLE tightness and arthritis pain?will have nursing give tylenol please 4. Mood: Ativan 1 mg daily.  Provide emotional support             -antipsychotic agents: [name] 5. Neuropsych: This patient is? capable of making decisions on his own behalf. 6. Skin/Wound Care: Routine skin checks 7. Fluids/Electrolytes/Nutrition: Routine in and outs 8.  Hypertension  Relatively controlled on 5/22             Monitor increase activity 9.  Hyperlipidemia.  Crestor/Colestid 10.  New onset atrial fibrillation.  Cardiac rate controlled.  Await plan to begin Eliquis  5/23- on Eliquis- stable rhythm/irregular- con't regimen             Monitor with increased activity 11.  Diabetes mellitus type 2 with hyperglycemia.  Hemoglobin A1c 6.6 2018 and 7.1 on this admission.  He was on no glucose lowering agents at home.  Currently SSI.  Await plan to possibly begin metformin.  Started  Farxiga 5mg  daily per pharmacy   Slightly labile on 5/22, would not make any further adjustments today  5/23- somewhat labile, but will try to increase [place] tomorrow to 10 mg daily.   5/24- increased to 10 mg daily- will monitor  5/25- BGs still an issue- will call DM coordinators- family says they wouldn't be able to do insulin easily.  5/26- spoke with DM coordinator- since pt's age as well as recent stroke, they feel control is  adequate and agree no insulin or changes to regimen              Monitor increase mobility 12.  History of colon cancer with colectomy 2018.  Follow-up outpatient 13.  Obesity.  BMI 36.24.  Dietary follow-up 14.  AKI/CKD3B.    Creatinine 1.68 on 5/19, labs ordered for tomorrow  5/26- Cr 1.58- doing slightly better- con't to monitor  Continue to monitor 15.  Decreased function - patient likely wax/wanes as previously noted to be extremely somnolent on acute floor.   CT head ordered, essentially showing expected evolution.  WBCs elevated at 11.5 on 5/22, however this appears to be within patient's range.  No fevers or signs/symptoms of infection.  UA/Ucx pending  Blood cultures ordered  5/23- U/A (-) except small Hb. No growth on blood Cx's so far- WBC up to 12.6- will also check CXR and looks better today- so will monitor and recheck in AM  5/24- WBC stable at 12.4- UCx (-); CXR shows some possible opacity diffuse/mild- atypical? Will call IM to help out.  Blood Cx's (-) so far.    5/25- blood Cx's still negative- spoke with IM and they agree could be atypical walking pneumonia wife mentioned that was coughing for 1 month prior to admission. Trying to get resp Cx- have asked if Respiratory can be called to get it for [place] since not coughing now. Labs in AM- will try Zpack 500 mg x1 and then 4 days of 250 mg since getting Cx so hard and this is only things that makes sense, based on CXR.   5/26- pt appears more awake and WBC down to 10.8 from 12.4- so think we chose  right to treat-   16. Constipation  5/23- will order sorbitol if hasn't ha BM- documented in computer 5 days ago?  5/24- no BM documented- will give another 60cc of Sorbitol- if no results, will check KUB/Mg citrate?  5/25- had 2 BMs yesterday- will con't to monitor- Cx pending    LOS: 8 days A FACE TO FACE EVALUATION WAS PERFORMED  Liberti Appleton 07/26/2020, 9:23 AM

## 2020-07-26 NOTE — Progress Notes (Signed)
Speech Language Pathology Daily Session Note  Patient Details  Name: ABIMAEL ZEITER MRN: [id number] Date of Birth: [birth year]  Today's Date: 07/26/2020 SLP Individual Time: 1350-1430 SLP Individual Time Calculation (min): 40 min  Short Term Goals: Week 1: SLP Short Term Goal 1 (Week 1): Patient will name at least 5 objects in category with min-modA cues. SLP Short Term Goal 2 (Week 1): Patient will describe object function for common objects, at phrase level with minA cues. SLP Short Term Goal 3 (Week 1): Patient will describe action pictures/photos at phrase level with minA cues. SLP Short Term Goal 4 (Week 1): Patient demonstrate awareness to errors when given modA cues during highly structured language tasks. SLP Short Term Goal 5 (Week 1): Patient will perform two-step commands/instructions with 80% accuracy and modA cues.  Skilled Therapeutic Interventions:   Patient seen with wife present for skilled SLP intervention focusing on receptive and expressive language. Patient was able to write names of objects correctly on 6/10 object pictures without cues. For one , he accurately described, ie: picture of a penny and he wrote "only one cint(sp)". Patient able to confirm errors when SLP incorrectly spelled his name, street name, city but was not able to identify errors of object words even with visual cue of correct spelling. He was able to write correct letters to finish spelling of single words with min-modA cues for 70% accuracy. Patient did appear a little tired and was in a seemingly good mood but was not as talkative as in previous sessions.  Pain Pain Assessment Pain Scale: Faces Faces Pain Scale: No hurt  Therapy/Group: Individual Therapy  Sonia Baller, MA, CCC-SLP Speech Therapy

## 2020-07-26 NOTE — Progress Notes (Signed)
fOccupational Therapy Session Note  Patient Details  Name: Edwin Wall MRN: [id number] Date of Birth: [birth year]  Today's Date: 07/26/2020 OT Individual Time: 0935-1000 OT Individual Time Calculation (min): 25 min    Short Term Goals: Week 1:  OT Short Term Goal 1 (Week 1): STGs = LTGs  Skilled Therapeutic Interventions/Progress Updates:    1:1. Pt received in recliner agreeable to OT. Pt completes ambulatory transfer with RW to/from recliner ~15 feet with CGA and MOD A to rise initially from recliner. Pt completes standing balance task of horse shoes with CA for balance with RW and emphasis n lateral weight shifting in mod ranges outside BOS. No LOB noted. Exited session with pt seated in bed, exit alarm on and call light in reach  Therapy Documentation Precautions:  Precautions Precautions: Fall Precaution Comments: R side inattention Restrictions Weight Bearing Restrictions: No General:   Vital Signs: Therapy Vitals Temp: 98.2 F (36.8 C) Pulse Rate: 84 Resp: 18 BP: (!) 131/91 Patient Position (if appropriate): Lying Oxygen Therapy SpO2: 96 % O2 Device: Room Air Pain:   ADL: ADL Eating: Set up Grooming: Setup Upper Body Bathing: Setup Where Assessed-Upper Body Bathing: Shower Lower Body Bathing: Contact guard Where Assessed-Lower Body Bathing: Shower Upper Body Dressing: Supervision/safety Where Assessed-Upper Body Dressing: Edge of bed Lower Body Dressing: Contact guard Where Assessed-Lower Body Dressing: Edge of bed Toileting: Contact guard Where Assessed-Toileting: Glass blower/designer: Therapist, music Method: Counselling psychologist: Energy manager: Curator Method: Heritage manager: Regulatory affairs officer    Praxis   Exercises:   Other Treatments:     Therapy/Group: Individual Therapy  Tonny Branch 07/26/2020, 6:49 AM

## 2020-07-27 LAB — GLUCOSE, CAPILLARY
Glucose-Capillary: 153 mg/dL — ABNORMAL HIGH (ref 70–99)
Glucose-Capillary: 158 mg/dL — ABNORMAL HIGH (ref 70–99)
Glucose-Capillary: 153 mg/dL — ABNORMAL HIGH (ref 70–99)
Glucose-Capillary: 137 mg/dL — ABNORMAL HIGH (ref 70–99)

## 2020-07-27 LAB — CULTURE, BLOOD (ROUTINE X 2)
Culture: NO GROWTH
Culture: NO GROWTH
Special Requests: ADEQUATE
Special Requests: ADEQUATE

## 2020-07-27 NOTE — Progress Notes (Signed)
Occupational Therapy Weekly Progress Note  Patient Details  Name: Edwin Wall MRN: [id number] Date of Birth: 1940/05/09  Beginning of progress report period: Jul 15, 2020 End of progress report period: Jul 27, 2020   The pt did not have any STGs for this reporting period d/t pt was originally expected to have a short stay. Pt's stay has been complicated with fluctuating skill performance at times needing Max A x2 for sit <> stands and Stedy transfers, but was treated medically and now back to [facility] for functional transfers and ambulation with RW. Pt continues to need Min A for LB ADLs and has decreased safety at times with aphasia limiting. Mild R side inattention. Wife present for most sessions, will continue POC and incorporate family ed/training.   Patient continues to demonstrate the following deficits: muscle weakness, decreased cardiorespiratoy endurance, impaired timing and sequencing, unbalanced muscle activation, decreased coordination and decreased motor planning, decreased initiation, decreased attention, decreased awareness, decreased problem solving, decreased safety awareness, decreased memory and delayed processing and decreased sitting balance, decreased standing balance, decreased postural control, hemiplegia and decreased balance strategies and therefore will continue to benefit from skilled OT intervention to enhance overall performance with BADL and Reduce care partner burden.  Patient progressing toward long term goals..  Continue plan of care.  OT Short Term Goals Week 1:  OT Short Term Goal 1 (Week 1): STGs = LTGs OT Short Term Goal 1 - Progress (Week 1): Progressing toward goal Week 2:  OT Short Term Goal 1 (Week 2): STGs = LTGs d/t ELOS at Supervision   Therapy Documentation Precautions:  Precautions Precautions: Fall Precaution Comments: R side inattention Restrictions Weight Bearing Restrictions: No    Therapy/Group: Individual Therapy  [name] 07/27/2020, 7:20 AM

## 2020-07-27 NOTE — Progress Notes (Signed)
Occupational Therapy Session Note  Patient Details  Name: Edwin Wall MRN: [id number] Date of Birth: 12-Apr-1940  Today's Date: 07/27/2020 OT Group Time: 1100-1200 OT Group Time Calculation (min): 60 min    Short Term Goals: Week 1:  OT Short Term Goal 1 (Week 1): STGs = LTGs  Skilled Therapeutic Interventions/Progress Updates:    Pt participated in rhythmic drumming group. Pain not reported during session. Focus of group on BUE coordination, strengthening, endurance, timing/control, activity tolerance, and social participation and engagement. Pt performs session from seated position for energy conservation. Skilled interventions included slower pace of moves to improve timing and control and grading movents up to work BUE strength. Warm up performed prior to exercises and UB stretching completed at end of group with demo from OT. Pt able to select preferred song to share with group. Returned pt to room at end of session. Exited session with pt seated in recliner, exit alarm on and call light in reach  Therapy Documentation Precautions:  Precautions Precautions: Fall Precaution Comments: R side inattention Restrictions Weight Bearing Restrictions: No General:   Vital Signs: Therapy Vitals Temp: 98 F (36.7 C) Temp Source: Oral Pulse Rate: 95 Resp: 18 BP: 121/89 Patient Position (if appropriate): Lying Oxygen Therapy SpO2: 97 % O2 Device: Room Air Pain:   ADL: ADL Eating: Set up Grooming: Setup Upper Body Bathing: Setup Where Assessed-Upper Body Bathing: Shower Lower Body Bathing: Contact guard Where Assessed-Lower Body Bathing: Shower Upper Body Dressing: Supervision/safety Where Assessed-Upper Body Dressing: Edge of bed Lower Body Dressing: Contact guard Where Assessed-Lower Body Dressing: Edge of bed Toileting: Contact guard Where Assessed-Toileting: Glass blower/designer: Therapist, music Method: Counselling psychologist:  Emergency planning/management officer Transfer: Curator Method: Wellsite geologist Equipment: Regulatory affairs officer    Praxis   Exercises:   Other Treatments:     Therapy/Group: Group Therapy  Tonny Branch 07/27/2020, 6:49 AM

## 2020-07-27 NOTE — Progress Notes (Signed)
Physical Therapy Session Note  Patient Details  Name: Edwin Wall MRN: [id number] Date of Birth: Jul 13, 1940  Today's Date: 07/27/2020 PT Individual Time: 1300-1345 PT Individual Time Calculation (min): 45 min   Short Term Goals: Week 1:  PT Short Term Goal 1 (Week 1): pt to demonstrate supine<>sit CGA PT Short Term Goal 2 (Week 1): pt to demonstrate functional transfers CGA PT Short Term Goal 3 (Week 1): pt to demonstrate gait 200' CGA with good awareness of Rt side Week 2:     Skilled Therapeutic Interventions/Progress Updates:    pt received in [facility] and agreeable to therapy. Pt directed in [place] mobility for 150' min A for directional navigation with noted path deviations to Rt side despite VC. Pt also frequently distracted to Lt side and decreased attention to Rt. Pt directed in x5 Sit to stand to Rolling walker from [facility] at min A with VC for hand placement, 150'x2 gait training with Rolling walker min A with VC for trunk extension and increased step length on Rt. Pt also directed in dynamic standing balance with card matching activity with emphasis on Rt side attention and reaching with RUE for all cards, CGA to complete, frequent VC for attention to Rt to retreive cards and to turn head. Pt then directed in gait training back to room 200' CGA with Rolling walker similar VC. Pt requested to return to bed at [facility]. Pt left in bed, All needs in reach and in good condition. Call light in hand.  And alarm set. Wife present.   Therapy Documentation Precautions:  Precautions Precautions: Fall Precaution Comments: R side inattention Restrictions Weight Bearing Restrictions: No General:   Vital Signs: Therapy Vitals Temp: 97.8 F (36.6 C) Pulse Rate: 100 Resp: 16 BP: 103/88 Patient Position (if appropriate): Lying Oxygen Therapy SpO2: 99 % O2 Device: Room Air Pain:   Mobility:   Locomotion :    Trunk/Postural Assessment :    Balance:   Exercises:   Other Treatments:       Therapy/Group: Individual Therapy  Junie Panning 07/27/2020, 3:29 PM

## 2020-07-27 NOTE — Progress Notes (Signed)
PROGRESS NOTE   Subjective/Complaints:  Pt reports a tiny bit of pain in feet B/L Slept well. Walked >100 ft with therapy- doing much better since being treated for mycoplasma pneumonia.    HLK:TGYBWLS by cognition and aphasia Objective:   No results found. Recent Labs    07/26/20 0455  WBC 10.8*  HGB 14.3  HCT 44.6  PLT 258   Recent Labs    07/26/20 0455  NA 137  K 4.1  CL 102  CO2 25  GLUCOSE 139*  BUN 32*  CREATININE 1.58*  CALCIUM 9.1    Intake/Output Summary (Last 24 hours) at 07/27/2020 1916 Last data filed at 07/27/2020 1828 Gross per 24 hour  Intake 630 ml  Output --  Net 630 ml        Physical Exam: Vital Signs Blood pressure 103/88, pulse 100, temperature 97.8 F (36.6 C), resp. rate 16, weight 104.1 kg, SpO2 99 %.        General: awake, alert, appropriate, sitting up in bed;  NAD HENT: conjugate gaze; oropharynx moist CV: regular rate; no JVD Pulmonary: CTA B/L; no W/R/R- good air movement GI: soft, NT, ND, (+)BS Psychiatric: appropriate; more interactive, in spite of expressive aphasia Neurological: more alert; speaking more spontaneously. MAS of 1 in R hip/knee Skin: Warm and dry.  Intact. Musc: No edema in extremities.  No tenderness in extremities. Aphasia still moderate but not as severe Motor exam remains somewhat limited due to participation, however >/4/5 throughout, appears unchanged  Assessment/Plan: 1. Functional deficits which require 3+ hours per day of interdisciplinary therapy in a comprehensive inpatient rehab setting.  Physiatrist is providing close team supervision and 24 hour management of active medical problems listed below.  Physiatrist and rehab team continue to assess barriers to discharge/monitor patient progress toward functional and medical goals  Care Tool:  Bathing    Body parts bathed by patient: Right arm,Left arm,Chest,Abdomen,Front perineal  area,Buttocks,Left upper leg,Right upper leg,Face     Body parts n/a: Left lower leg,Right lower leg   Bathing assist Assist Level: Minimal Assistance - Patient > 75%     Upper Body Dressing/Undressing Upper body dressing   What is the patient wearing?: Pull over shirt    Upper body assist Assist Level: Supervision/Verbal cueing    Lower Body Dressing/Undressing Lower body dressing      What is the patient wearing?: Pants     Lower body assist Assist for lower body dressing: Minimal Assistance - Patient > 75%     Toileting Toileting    Toileting assist Assist for toileting: Maximal Assistance - Patient 25 - 49%     Transfers Chair/bed transfer  Transfers assist     Chair/bed transfer assist level: Contact Guard/Touching assist     Locomotion Ambulation   Ambulation assist      Assist level: Minimal Assistance - Patient > 75% Assistive device: Parallel bars Max distance: 10'   Walk 10 feet activity   Assist     Assist level: Minimal Assistance - Patient > 75% Assistive device: Parallel bars   Walk 50 feet activity   Assist    Assist level: Minimal Assistance - Patient > 75% Assistive  device: Walker-rolling    Walk 150 feet activity   Assist Walk 150 feet activity did not occur: Safety/medical concerns         Walk 10 feet on uneven surface  activity   Assist Walk 10 feet on uneven surfaces activity did not occur: Safety/medical concerns         Wheelchair     Assist Will patient use wheelchair at discharge?: Yes (Per PT long term goals) Type of Wheelchair: Manual    Wheelchair assist level: Moderate Assistance - Patient 50 - 74% Max wheelchair distance: 150    Wheelchair 50 feet with 2 turns activity    Assist        Assist Level: Moderate Assistance - Patient 50 - 74%   Wheelchair 150 feet activity     Assist      Assist Level: Moderate Assistance - Patient 50 - 74%   Blood pressure 103/88,  pulse 100, temperature 97.8 F (36.6 C), resp. rate 16, weight 104.1 kg, SpO2 99 %.  Medical Problem List and Plan: 1.  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  CT scan showing expected evolution of left MCA infarct with?  Petechial hemorrhage.  5/27- con't PT, OT and SLP- more alert- walked ?100 ft with RW with PT- function progressing again 2.  Antithrombotics: -DVT/anticoagulation: Awaiting plan transition to Eliquis             -antiplatelet therapy: Currently on aspirin 325 mg daily awaiting transition to Eliquis for stroke prevention  5/23- has petechial hemorrhage possibly-  Started on Eliquis Friday- will monitor 3. Pain Management: Tylenol as needed  5/27- tiny bit of foot pain B/L- con't tylenol prn 4. Mood: Ativan 1 mg daily.  Provide emotional support             -antipsychotic agents: [name] 5. Neuropsych: This patient is? capable of making decisions on his own behalf. 6. Skin/Wound Care: Routine skin checks 7. Fluids/Electrolytes/Nutrition: Routine in and outs 8.  Hypertension  Relatively controlled on 5/22             Monitor increase activity 9.  Hyperlipidemia.  Crestor/Colestid 10.  New onset atrial fibrillation.  Cardiac rate controlled.  Await plan to begin Eliquis  5/23- on Eliquis- stable rhythm/irregular- con't regimen             Monitor with increased activity 11.  Diabetes mellitus type 2 with hyperglycemia.  Hemoglobin A1c 6.6 2018 and 7.1 on this admission.  He was on no glucose lowering agents at home.  Currently SSI.  Await plan to possibly begin metformin.  Started Farxiga 5mg  daily per pharmacy   Slightly labile on 5/22, would not make any further adjustments today  5/23- somewhat labile, but will try to increase [place] tomorrow to 10 mg daily.    5/24- increased to 10 mg daily- will monitor  5/25- BGs still an issue- will call DM coordinators- family says they wouldn't be able to do insulin easily.  5/26- spoke with DM coordinator- since pt's age as well as recent stroke, they feel control is adequate and agree no insulin or changes to regimen              Monitor increase mobility 12.  History of colon cancer with colectomy 2018.  Follow-up outpatient 13.  Obesity.  BMI 36.24.  Dietary follow-up 14.  AKI/CKD3B.    Creatinine 1.68 on 5/19, labs ordered for tomorrow  5/26- Cr 1.58- doing slightly better- con't to monitor  Continue to monitor 15.  Decreased function - patient likely wax/wanes as previously noted to be extremely somnolent on acute floor.   CT head ordered, essentially showing expected evolution.  WBCs elevated at 11.5 on 5/22, however this appears to be within patient's range.  No fevers or signs/symptoms of infection.  UA/Ucx pending  Blood cultures ordered  5/23- U/A (-) except small Hb. No growth on blood Cx's so far- WBC up to 12.6- will also check CXR and looks better today- so will monitor and recheck in AM  5/24- WBC stable at 12.4- UCx (-); CXR shows some possible opacity diffuse/mild- atypical? Will call IM to help out.  Blood Cx's (-) so far.    5/25- blood Cx's still negative- spoke with IM and they agree could be atypical walking pneumonia wife mentioned that was coughing for 1 month prior to admission. Trying to get resp Cx- have asked if Respiratory can be called to get it for [place] since not coughing now. Labs in AM- will try Zpack 500 mg x1 and then 4 days of 250 mg since getting Cx so hard and this is only things that makes sense, based on CXR.   5/26- pt appears more awake and WBC down to 10.8 from 12.4- so think we chose right to treat-  5/27- blood Cx's (-) x5 days- mycoplasma Cx pending- says collected but has nothing else- con't PO ABX/Zpack- Sx's much better   16. Constipation  5/23- will order  sorbitol if hasn't ha BM- documented in computer 5 days ago?  5/24- no BM documented- will give another 60cc of Sorbitol- if no results, will check KUB/Mg citrate?  5/25- had 2 BMs yesterday- will con't to monitor-     LOS: 9 days A FACE TO FACE EVALUATION WAS PERFORMED  Henessy Rohrer 07/27/2020, 7:16 PM

## 2020-07-27 NOTE — Progress Notes (Signed)
Occupational Therapy Session Note  Patient Details  Name: Edwin Wall MRN: [id number] Date of Birth: [birth year]  Today's Date: 07/27/2020 OT Individual Time:  1000- 1055   55 min    Short Term Goals: Week 1:  OT Short Term Goal 1 (Week 1): STGs = LTGs OT Short Term Goal 1 - Progress (Week 1): Progressing toward goal Week 2:  OT Short Term Goal 1 (Week 2): STGs = LTGs d/t ELOS at Supervision   Skilled Therapeutic Interventions/Progress Updates:    Pt received supine in bed, agreeable to OT session, reporting no pain, wife present for session activities in pt room. Sup>sit using bed rails close spvsn. Sit<>stands throughout session with RW min A progressing to CGA. Stand pivot transfer with RW bed>w/c<>therapy mat CGA. Pt engaged in grooming tasks at sink with set up A including oral hygiene and shaving face and therapist assist for thoroughness; pt demo'd good attention to R side of face shaving with L hand. UB dressing set up A; LB dressing min A seated and in stance with vc's for donning R leg first. Pt donned belt min A for balance and one LOB noted. Pt ambulated ~100' HHA with improved step pattern and balance. Pt engaged in dynamic standing balance tasks BUEs unsupported involving throwing, catching and bouncing ball with intermittent slight LOB and self correcting. Pt required occasional seated rest breaks due to fatigue. Pt engaged in conditioning tasks involving trunk rotation with 2 lb weighted ball x20 reps. Pt remained seated in w/c in day room ready for drumming group therapy session, all needs met.  Therapy Documentation Precautions:  Precautions Precautions: Fall Precaution Comments: R side inattention Restrictions Weight Bearing Restrictions: No Pain: Pain Assessment Pain Scale: 0-10 Pain Score: 0-No pain   Therapy/Group: Individual Therapy  Mellissa Kohut 07/27/2020, 3:56 PM

## 2020-07-27 NOTE — Progress Notes (Signed)
Speech Language Pathology Weekly Progress and Session Note  Patient Details  Name: Edwin Wall MRN: [id number] Date of Birth: [birth year]  Beginning of progress report period: 07/19/2020 End of progress report period: 07/27/2020  Today's Date: 07/27/2020 SLP Individual Time:  -     Short Term Goals: Week 1: SLP Short Term Goal 1 (Week 1): Patient will name at least 5 objects in category with min-modA cues. SLP Short Term Goal 1 - Progress (Week 1): Progressing toward goal SLP Short Term Goal 2 (Week 1): Patient will describe object function for common objects, at phrase level with minA cues. SLP Short Term Goal 2 - Progress (Week 1): Progressing toward goal SLP Short Term Goal 3 (Week 1): Patient will describe action pictures/photos at phrase level with minA cues. SLP Short Term Goal 3 - Progress (Week 1): Met SLP Short Term Goal 4 (Week 1): Patient demonstrate awareness to errors when given modA cues during highly structured language tasks. SLP Short Term Goal 4 - Progress (Week 1): Not met SLP Short Term Goal 5 (Week 1): Patient will perform two-step commands/instructions with 80% accuracy and modA cues. SLP Short Term Goal 5 - Progress (Week 1): Progressing toward goal    New Short Term Goals: Week 2: SLP Short Term Goal 1 (Week 2): Patient will correct errors in written language at word level with modA cues. SLP Short Term Goal 2 (Week 2): Patient will describe object function for common objects, at phrase level with minA cues. SLP Short Term Goal 3 (Week 2): Patient will verbally or in written form, describe at phrase level with modA cues for correcting errors. SLP Short Term Goal 4 (Week 2): Patient will answer mildly complex yes/no questions with 80% accuracy and modA. SLP Short Term Goal 5 (Week 2): Patient will demonstrate awareness to errors at word level written responses with modA.  Weekly Progress Updates:  Patient has made slower progress than expected, meeting 1  goal but progressing towards most others. He continues to show no significant amount of awareness to his spoken and written language errors however he is able to locate errors when cued to compare two words printed(one with errored letter/s). Patient does become slightly frustrated at times with language tasks, though he does work hard. Stay extended to due decline in PT and OT, determined to be from walking PNA.    Intensity: Minumum of 1-2 x/day, 30 to 90 minutes Frequency: 3 to 5 out of 7 days Duration/Length of Stay: 6/9 Treatment/Interventions:      Sonia Baller, MA, CCC-SLP Speech Therapy

## 2020-07-28 DIAGNOSIS — E669 Obesity, unspecified: Secondary | ICD-10-CM

## 2020-07-28 DIAGNOSIS — E1169 Type 2 diabetes mellitus with other specified complication: Secondary | ICD-10-CM

## 2020-07-28 LAB — GLUCOSE, CAPILLARY
Glucose-Capillary: 153 mg/dL — ABNORMAL HIGH (ref 70–99)
Glucose-Capillary: 149 mg/dL — ABNORMAL HIGH (ref 70–99)
Glucose-Capillary: 114 mg/dL — ABNORMAL HIGH (ref 70–99)
Glucose-Capillary: 195 mg/dL — ABNORMAL HIGH (ref 70–99)

## 2020-07-28 NOTE — Plan of Care (Signed)
  Problem: Consults Goal: RH STROKE PATIENT EDUCATION Description: See Patient Education module for education specifics  Outcome: Progressing Goal: Diabetes Guidelines if Diabetic/Glucose > 140 Description: If diabetic or lab glucose is > 140 mg/dl - Initiate Diabetes/Hyperglycemia Guidelines & Document Interventions  Outcome: Progressing   Problem: RH BOWEL ELIMINATION Goal: RH STG MANAGE BOWEL WITH ASSISTANCE Description: STG Manage Bowel with Mod I Assistance. Outcome: Progressing Goal: RH STG MANAGE BOWEL W/MEDICATION W/ASSISTANCE Description: STG Manage Bowel with Medication with Mod I Assistance. Outcome: Progressing   Problem: RH BLADDER ELIMINATION Goal: RH STG MANAGE BLADDER WITH ASSISTANCE Description: STG Manage Bladder With Mod I Assistance Outcome: Progressing Goal: RH STG MANAGE BLADDER WITH MEDICATION WITH ASSISTANCE Description: STG Manage Bladder With Medication With mod I Assistance. Outcome: Progressing   Problem: RH SAFETY Goal: RH STG ADHERE TO SAFETY PRECAUTIONS W/ASSISTANCE/DEVICE Description: STG Adhere to Safety Precautions With Mod I Assistance/Device. Outcome: Progressing   Problem: RH KNOWLEDGE DEFICIT Goal: RH STG INCREASE KNOWLEDGE OF DIABETES Description: Patient will demonstrate knowledge of diabetes medication management, dietary management, insulin management with educational handouts and materials provided by staff, at discharge independently. Outcome: Progressing Goal: RH STG INCREASE KNOWLEDGE OF STROKE PROPHYLAXIS Description: Patient will be able to demonstrate knowledge of medications used to prevent future stroke with education materials and handouts provided by staff, at discharge independently. Outcome: Progressing

## 2020-07-28 NOTE — Progress Notes (Signed)
Physical Therapy Session Note  Patient Details  Name: Edwin Wall MRN: [id number] Date of Birth: Dec 27, 1940  Today's Date: 07/28/2020 PT Individual Time: 3664-4034 PT Individual Time Calculation (min): 55 min   Short Term Goals: Week 1:  PT Short Term Goal 1 (Week 1): pt to demonstrate supine<>sit CGA PT Short Term Goal 2 (Week 1): pt to demonstrate functional transfers CGA PT Short Term Goal 3 (Week 1): pt to demonstrate gait 200' CGA with good awareness of Rt side  Skilled Therapeutic Interventions/Progress Updates:    Patient received supine in bed, NT present, agreeable to PT. He denies pain. Patient found to have been incontinent of urine. He was able to roll B using bed rails and supervision. TotalA for perihygiene and brief change. He was able to come sit edge of bed with MinA. He reported feeling as though he needed to have a bowel movement. Patient ambulating to bathroom with RW and MinA. Verbal cues to remain within walker frame while ambulating. Patient able to doff brief while standing with CGA for balance. After some time, patient unable to have a bowel movement, but was able to further void. Patient donning brief with CGA for balance and CGA to ambulate to wc with RW. PT transporting patient in wc to therapy gym for time management. Tasks on the BITS completed with emphasis on R attention including visual scanning, simple trail making, complex trail making and maze completion. Patient requiring at least MinA to complete all tasks accurately, but required MaxA/TotalA to complete complex trail making accurately. Patient returning to room in wc, staying up in chair, seatbelt alarm on, call light within reach.   Therapy Documentation Precautions:  Precautions Precautions: Fall Precaution Comments: R side inattention Restrictions Weight Bearing Restrictions: No    Therapy/Group: Individual Therapy  Karoline Caldwell, PT, DPT, CBIS  07/28/2020, 7:34 AM

## 2020-07-28 NOTE — Progress Notes (Signed)
PROGRESS NOTE   Subjective/Complaints:  No  New issues. In bed feeding self- with left hand, won't engage with right  ROS: limited due to language/communication    Objective:   No results found. Recent Labs    07/26/20 0455  WBC 10.8*  HGB 14.3  HCT 44.6  PLT 258   Recent Labs    07/26/20 0455  NA 137  K 4.1  CL 102  CO2 25  GLUCOSE 139*  BUN 32*  CREATININE 1.58*  CALCIUM 9.1    Intake/Output Summary (Last 24 hours) at 07/28/2020 0830 Last data filed at 07/27/2020 1828 Gross per 24 hour  Intake 480 ml  Output --  Net 480 ml        Physical Exam: Vital Signs Blood pressure (!) 141/90, pulse 86, temperature 98.4 F (36.9 C), resp. rate 12, weight 104.1 kg, SpO2 97 %.    Constitutional: No distress . Vital signs reviewed. HEENT: EOMI, oral membranes moist Neck: supple Cardiovascular: RRR without murmur. No JVD    Respiratory/Chest: CTA Bilaterally without wheezes or rales. Normal effort    GI/Abdomen: BS +, non-tender, non-distended Ext: no clubbing, cyanosis, or edema Psych: pleasant and cooperative, sl flat Neurological: more alert; speaking more spontaneously. MAS of 1 in R hip/knee Skin: Warm and dry.  Intact. Musc: No edema in extremities.  No tenderness in extremities. Word finding deficits. Seem to be able to convey thoughts to me. Motor exam remains somewhat limited due to participation, however >/4/5 throughout RUE and RLE   Assessment/Plan: 1. Functional deficits which require 3+ hours per day of interdisciplinary therapy in a comprehensive inpatient rehab setting.  Physiatrist is providing close team supervision and 24 hour management of active medical problems listed below.  Physiatrist and rehab team continue to assess barriers to discharge/monitor patient progress toward functional and medical goals  Care Tool:  Bathing    Body parts bathed by patient: Right arm,Left  arm,Chest,Abdomen,Front perineal area,Buttocks,Left upper leg,Right upper leg,Face     Body parts n/a: Left lower leg,Right lower leg   Bathing assist Assist Level: Minimal Assistance - Patient > 75%     Upper Body Dressing/Undressing Upper body dressing   What is the patient wearing?: Pull over shirt    Upper body assist Assist Level: Supervision/Verbal cueing    Lower Body Dressing/Undressing Lower body dressing      What is the patient wearing?: Pants     Lower body assist Assist for lower body dressing: Minimal Assistance - Patient > 75%     Toileting Toileting    Toileting assist Assist for toileting: Maximal Assistance - Patient 25 - 49%     Transfers Chair/bed transfer  Transfers assist     Chair/bed transfer assist level: Contact Guard/Touching assist     Locomotion Ambulation   Ambulation assist      Assist level: Minimal Assistance - Patient > 75% Assistive device: Parallel bars Max distance: 10'   Walk 10 feet activity   Assist     Assist level: Minimal Assistance - Patient > 75% Assistive device: Parallel bars   Walk 50 feet activity   Assist    Assist level: Minimal Assistance -  Patient > 75% Assistive device: Walker-rolling    Walk 150 feet activity   Assist Walk 150 feet activity did not occur: Safety/medical concerns         Walk 10 feet on uneven surface  activity   Assist Walk 10 feet on uneven surfaces activity did not occur: Safety/medical concerns         Wheelchair     Assist Will patient use wheelchair at discharge?: Yes (Per PT long term goals) Type of Wheelchair: Manual    Wheelchair assist level: Moderate Assistance - Patient 50 - 74% Max wheelchair distance: 150    Wheelchair 50 feet with 2 turns activity    Assist        Assist Level: Moderate Assistance - Patient 50 - 74%   Wheelchair 150 feet activity     Assist      Assist Level: Moderate Assistance - Patient 50 -  74%   Blood pressure (!) 141/90, pulse 86, temperature 98.4 F (36.9 C), resp. rate 12, weight 104.1 kg, SpO2 97 %.  Medical Problem List and Plan: 1.  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  CT scan showing expected evolution of left MCA infarct with?  Petechial hemorrhage.    2.  Antithrombotics: -DVT/anticoagulation: Awaiting plan transition to Eliquis             -antiplatelet therapy: Currently on aspirin 325 mg daily awaiting transition to Eliquis for stroke prevention  5/23- has petechial hemorrhage possibly-  Started on Eliquis Friday- will monitor 3. Pain Management: Tylenol as needed  5/27- tiny bit of foot pain B/L- con't tylenol prn 4. Mood: Ativan 1 mg daily.  Provide emotional support             -antipsychotic agents: [name] 5. Neuropsych: This patient is? capable of making decisions on his own behalf. 6. Skin/Wound Care: Routine skin checks 7. Fluids/Electrolytes/Nutrition: Routine in and outs 8.  Hypertension  Relatively controlled on 5/28             Monitor increase activity 9.  Hyperlipidemia.  Crestor/Colestid 10.  New onset atrial fibrillation.  Cardiac rate controlled.  Await plan to begin Eliquis  5/23- on Eliquis- stable rhythm/irregular- con't regimen             Monitor with increased activity 11.  Diabetes mellitus type 2 with hyperglycemia.  Hemoglobin A1c 6.6 2018 and 7.1 on this admission.  He was on no glucose lowering agents at home.  Currently SSI.  Await plan to possibly begin metformin.  Started Farxiga 5mg  daily per pharmacy   Slightly labile on 5/22, would not make any further adjustments today  5/23- somewhat labile, but will try to increase [place] tomorrow to 10 mg daily.   5/24- increased to 10 mg  daily- will monitor  5/25- BGs still an issue- will call DM coordinators- family says they wouldn't be able to do insulin easily.  5/26- spoke with DM coordinator- since pt's age as well as recent stroke, they feel control is adequate and agree no insulin or changes to regimen              5/28 fair control at present 12.  History of colon cancer with colectomy 2018.  Follow-up outpatient  13.  Obesity.  BMI 36.24.  Dietary follow-up 14.  AKI/CKD3B.    Creatinine 1.68 on 5/19, labs ordered for tomorrow  5/26- Cr 1.58- doing slightly better- con't to monitor  Continue to monitor 15.  Decreased function - patient likely wax/wanes as previously noted to be extremely somnolent on acute floor.   CT head ordered, essentially showing expected evolution.  WBCs elevated at 11.5 on 5/22, however this appears to be within patient's range.  No fevers or signs/symptoms of infection.  UA/Ucx pending  Blood cultures ordered  5/23- U/A (-) except small Hb. No growth on blood Cx's so far- WBC up to 12.6- will also check CXR and looks better today- so will monitor and recheck in AM  5/24- WBC stable at 12.4- UCx (-); CXR shows some possible opacity diffuse/mild- atypical? Will call IM to help out.  Blood Cx's (-) so far.    5/25- blood Cx's still negative- spoke with IM and they agree could be atypical walking pneumonia wife mentioned that was coughing for 1 month prior to admission. Trying to get resp Cx- have asked if Respiratory can be called to get it for [place] since not coughing now. Labs in AM- will try Zpack 500 mg x1 and then 4 days of 250 mg since getting Cx so hard and this is only things that makes sense, based on CXR.   5/26- pt appears more awake and WBC down to 10.8 from 12.4- so think we chose right to treat-  5/28-mycoplasma cx still active/pending. z-pack with improvement in sx 16. Constipation  5/23- will order sorbitol if hasn't ha BM- documented in computer 5 days ago?  5/24- no BM documented-  will give another 60cc of Sorbitol- if no results, will check KUB/Mg citrate?  5/28-moving bowels-     LOS: 10 days A FACE TO FACE EVALUATION WAS PERFORMED  Meredith Staggers 07/28/2020, 8:30 AM

## 2020-07-29 LAB — GLUCOSE, CAPILLARY
Glucose-Capillary: 155 mg/dL — ABNORMAL HIGH (ref 70–99)
Glucose-Capillary: 111 mg/dL — ABNORMAL HIGH (ref 70–99)
Glucose-Capillary: 132 mg/dL — ABNORMAL HIGH (ref 70–99)
Glucose-Capillary: 184 mg/dL — ABNORMAL HIGH (ref 70–99)

## 2020-07-29 NOTE — Progress Notes (Signed)
PROGRESS NOTE   Subjective/Complaints:  Able to sleep last night. Denies pain this morning.   ROS: somewhat limited d/t language    Objective:   No results found. No results for input(s): WBC, HGB, HCT, PLT in the last 72 hours. No results for input(s): NA, K, CL, CO2, GLUCOSE, BUN, CREATININE, CALCIUM in the last 72 hours.  Intake/Output Summary (Last 24 hours) at 07/29/2020 0728 Last data filed at 07/28/2020 1810 Gross per 24 hour  Intake 542 ml  Output --  Net 542 ml        Physical Exam: Vital Signs Blood pressure 107/74, pulse 94, temperature 97.9 F (36.6 C), resp. rate 20, weight 104.1 kg, SpO2 94 %.    Constitutional: No distress . Vital signs reviewed. HEENT: EOMI, oral membranes moist Neck: supple Cardiovascular: RRR without murmur. No JVD    Respiratory/Chest: CTA Bilaterally without wheezes or rales. Normal effort    GI/Abdomen: BS +, non-tender, non-distended Ext: no clubbing, cyanosis, or edema Psych: flat but cooperative Neurological: more alert; speaking more spontaneously. MAS of 1 in R hip/knee Skin: Warm and dry.  Intact. Musc: No edema in extremities.  No tenderness in extremities. Word finding deficits. Seem to be able to convey thoughts to me. Motor exam remains somewhat limited due to participation, however >/4/5 throughout RUE and RLE --stable  Assessment/Plan: 1. Functional deficits which require 3+ hours per day of interdisciplinary therapy in a comprehensive inpatient rehab setting.  Physiatrist is providing close team supervision and 24 hour management of active medical problems listed below.  Physiatrist and rehab team continue to assess barriers to discharge/monitor patient progress toward functional and medical goals  Care Tool:  Bathing    Body parts bathed by patient: Right arm,Left arm,Chest,Abdomen,Front perineal area,Buttocks,Left upper leg,Right upper leg,Face      Body parts n/a: Left lower leg,Right lower leg   Bathing assist Assist Level: Minimal Assistance - Patient > 75%     Upper Body Dressing/Undressing Upper body dressing   What is the patient wearing?: Pull over shirt    Upper body assist Assist Level: Supervision/Verbal cueing    Lower Body Dressing/Undressing Lower body dressing      What is the patient wearing?: Pants     Lower body assist Assist for lower body dressing: Minimal Assistance - Patient > 75%     Toileting Toileting    Toileting assist Assist for toileting: Maximal Assistance - Patient 25 - 49%     Transfers Chair/bed transfer  Transfers assist     Chair/bed transfer assist level: Contact Guard/Touching assist     Locomotion Ambulation   Ambulation assist      Assist level: Minimal Assistance - Patient > 75% Assistive device: Walker-rolling Max distance: 10'   Walk 10 feet activity   Assist     Assist level: Minimal Assistance - Patient > 75% Assistive device: Walker-rolling   Walk 50 feet activity   Assist    Assist level: Minimal Assistance - Patient > 75% Assistive device: Walker-rolling    Walk 150 feet activity   Assist Walk 150 feet activity did not occur: Safety/medical concerns         Walk  10 feet on uneven surface  activity   Assist Walk 10 feet on uneven surfaces activity did not occur: Safety/medical concerns         Wheelchair     Assist Will patient use wheelchair at discharge?: Yes (Per PT long term goals) Type of Wheelchair: Manual    Wheelchair assist level: Moderate Assistance - Patient 50 - 74% Max wheelchair distance: 150    Wheelchair 50 feet with 2 turns activity    Assist        Assist Level: Moderate Assistance - Patient 50 - 74%   Wheelchair 150 feet activity     Assist      Assist Level: Moderate Assistance - Patient 50 - 74%   Blood pressure 107/74, pulse 94, temperature 97.9 F (36.6 C), resp. rate 20,  weight 104.1 kg, SpO2 94 %.  Medical Problem List and Plan: 1.  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  CT scan showing expected evolution of left MCA infarct with?  Petechial hemorrhage.    2.  Antithrombotics: -DVT/anticoagulation: Awaiting plan transition to Eliquis             -antiplatelet therapy: Currently on aspirin 325 mg daily awaiting transition to Eliquis for stroke prevention  5/23- has petechial hemorrhage possibly-  Started on Eliquis Friday- will monitor 3. Pain Management: Tylenol as needed  5/27- tiny bit of foot pain B/L- con't tylenol prn 4. Mood: Ativan 1 mg daily.  Provide emotional support             -antipsychotic agents: [name] 5. Neuropsych: This patient is? capable of making decisions on his own behalf. 6. Skin/Wound Care: Routine skin checks 7. Fluids/Electrolytes/Nutrition: Routine in and outs 8.  Hypertension  Relatively controlled on 5/29             Monitor increase activity 9.  Hyperlipidemia.  Crestor/Colestid 10.  New onset atrial fibrillation.  Cardiac rate controlled.  Await plan to begin Eliquis  5/23- on Eliquis- stable rhythm/irregular- con't regimen             Monitor with increased activity 11.  Diabetes mellitus type 2 with hyperglycemia.  Hemoglobin A1c 6.6 2018 and 7.1 on this admission.  He was on no glucose lowering agents at home.  Currently SSI.  Await plan to possibly begin metformin.  Started Farxiga 5mg  daily per pharmacy   Slightly labile on 5/22, would not make any further adjustments today  5/23- somewhat labile, but will try to increase [place] tomorrow to 10 mg daily.   5/24- increased to 10 mg daily- will monitor  5/25- BGs still an issue- will call DM coordinators- family says  they wouldn't be able to do insulin easily.  5/26- spoke with DM coordinator- since pt's age as well as recent stroke, they feel control is adequate and agree no insulin or changes to regimen              5/29 fair to borderline control at present 12.  History of colon cancer with colectomy 2018.  Follow-up outpatient 13.  Obesity.  BMI 36.24.  Dietary follow-up 14.  AKI/CKD3B.    Creatinine 1.68 on 5/19, labs ordered for tomorrow  5/26- Cr 1.58- doing slightly better- con't to  monitor  Continue to monitor 15.  Decreased function - patient likely wax/wanes as previously noted to be extremely somnolent on acute floor.   CT head ordered, essentially showing expected evolution.  WBCs elevated at 11.5 on 5/22, however this appears to be within patient's range.  No fevers or signs/symptoms of infection.  UA/Ucx pending  Blood cultures ordered  5/23- U/A (-) except small Hb. No growth on blood Cx's so far- WBC up to 12.6- will also check CXR and looks better today- so will monitor and recheck in AM  5/24- WBC stable at 12.4- UCx (-); CXR shows some possible opacity diffuse/mild- atypical? Will call IM to help out.  Blood Cx's (-) so far.    5/25- blood Cx's still negative- spoke with IM and they agree could be atypical walking pneumonia wife mentioned that was coughing for 1 month prior to admission. Trying to get resp Cx- have asked if Respiratory can be called to get it for [place] since not coughing now. Labs in AM- will try Zpack 500 mg x1 and then 4 days of 250 mg since getting Cx so hard and this is only things that makes sense, based on CXR.   5/26- pt appears more awake and WBC down to 10.8 from 12.4- so think we chose right to treat-  5/29-mycoplasma cx still active/pending. Sx appear improved 16. Constipation  5/23- will order sorbitol if hasn't ha BM- documented in computer 5 days ago?  5/24- no BM documented- will give another 60cc of Sorbitol- if no results, will check KUB/Mg  citrate?  5/28--->2 large bm's     LOS: 11 days A FACE TO [place] 07/29/2020, 7:28 AM

## 2020-07-29 NOTE — Plan of Care (Signed)
  Problem: Consults Goal: RH STROKE PATIENT EDUCATION Description: See Patient Education module for education specifics  Outcome: Progressing Goal: Diabetes Guidelines if Diabetic/Glucose > 140 Description: If diabetic or lab glucose is > 140 mg/dl - Initiate Diabetes/Hyperglycemia Guidelines & Document Interventions  Outcome: Progressing   Problem: RH BOWEL ELIMINATION Goal: RH STG MANAGE BOWEL WITH ASSISTANCE Description: STG Manage Bowel with Mod I Assistance. Outcome: Progressing Goal: RH STG MANAGE BOWEL W/MEDICATION W/ASSISTANCE Description: STG Manage Bowel with Medication with Mod I Assistance. Outcome: Progressing   Problem: RH BLADDER ELIMINATION Goal: RH STG MANAGE BLADDER WITH MEDICATION WITH ASSISTANCE Description: STG Manage Bladder With Medication With mod I Assistance. Outcome: Progressing   Problem: RH SAFETY Goal: RH STG ADHERE TO SAFETY PRECAUTIONS W/ASSISTANCE/DEVICE Description: STG Adhere to Safety Precautions With Mod I Assistance/Device. Outcome: Progressing   Problem: RH KNOWLEDGE DEFICIT Goal: RH STG INCREASE KNOWLEDGE OF DIABETES Description: Patient will demonstrate knowledge of diabetes medication management, dietary management, insulin management with educational handouts and materials provided by staff, at discharge independently. Outcome: Progressing Goal: RH STG INCREASE KNOWLEDGE OF STROKE PROPHYLAXIS Description: Patient will be able to demonstrate knowledge of medications used to prevent future stroke with education materials and handouts provided by staff, at discharge independently. Outcome: Progressing   Problem: RH BLADDER ELIMINATION Goal: RH STG MANAGE BLADDER WITH ASSISTANCE Description: STG Manage Bladder With Mod I Assistance Outcome: Not Progressing

## 2020-07-30 LAB — CBC WITH DIFFERENTIAL/PLATELET
Abs Immature Granulocytes: 0.04 10*3/uL (ref 0.00–0.07)
Basophils Absolute: 0.1 10*3/uL (ref 0.0–0.1)
Basophils Relative: 1 %
Eosinophils Absolute: 0.2 10*3/uL (ref 0.0–0.5)
Eosinophils Relative: 2 %
HCT: 48.6 % (ref 39.0–52.0)
Hemoglobin: 15.5 g/dL (ref 13.0–17.0)
Immature Granulocytes: 0 %
Lymphocytes Relative: 26 %
Lymphs Abs: 2.7 10*3/uL (ref 0.7–4.0)
MCH: 30.5 pg (ref 26.0–34.0)
MCHC: 31.9 g/dL (ref 30.0–36.0)
MCV: 95.7 fL (ref 80.0–100.0)
Monocytes Absolute: 0.8 10*3/uL (ref 0.1–1.0)
Monocytes Relative: 7 %
Neutro Abs: 6.6 10*3/uL (ref 1.7–7.7)
Neutrophils Relative %: 64 %
Platelets: 300 10*3/uL (ref 150–400)
RBC: 5.08 MIL/uL (ref 4.22–5.81)
RDW: 12.4 % (ref 11.5–15.5)
WBC: 10.3 10*3/uL (ref 4.0–10.5)
nRBC: 0 % (ref 0.0–0.2)

## 2020-07-30 LAB — GLUCOSE, CAPILLARY
Glucose-Capillary: 134 mg/dL — ABNORMAL HIGH (ref 70–99)
Glucose-Capillary: 130 mg/dL — ABNORMAL HIGH (ref 70–99)
Glucose-Capillary: 137 mg/dL — ABNORMAL HIGH (ref 70–99)
Glucose-Capillary: 120 mg/dL — ABNORMAL HIGH (ref 70–99)

## 2020-07-30 LAB — BASIC METABOLIC PANEL
Anion gap: 7 (ref 5–15)
BUN: 26 mg/dL — ABNORMAL HIGH (ref 8–23)
CO2: 24 mmol/L (ref 22–32)
Calcium: 9.1 mg/dL (ref 8.9–10.3)
Chloride: 110 mmol/L (ref 98–111)
Creatinine, Ser: 1.64 mg/dL — ABNORMAL HIGH (ref 0.61–1.24)
GFR, Estimated: 42 mL/min — ABNORMAL LOW (ref >60)
Glucose, Bld: 137 mg/dL — ABNORMAL HIGH (ref 70–99)
Potassium: 4 mmol/L (ref 3.5–5.1)
Sodium: 141 mmol/L (ref 135–145)

## 2020-07-30 NOTE — Progress Notes (Signed)
Physical Therapy Session Note  Patient Details  Name: Edwin Wall MRN: [id number] Date of Birth: 08-Mar-1940  Today's Date: 07/30/2020 PT Individual Time: 1437-1540 PT Individual Time Calculation (min): 63 min   Short Term Goals: Week 1:  PT Short Term Goal 1 (Week 1): pt to demonstrate supine<>sit CGA PT Short Term Goal 2 (Week 1): pt to demonstrate functional transfers CGA PT Short Term Goal 3 (Week 1): pt to demonstrate gait 200' CGA with good awareness of Rt side Week 2:     Skilled Therapeutic Interventions/Progress Updates:  Patient supine in bed on entrance to room. Patient alert and agreeable to PT session. Patient denied pain throughout session.  Therapeutic Activity: Bed Mobility: Patient performed supine <> sit at R side of bed with supervision using bedrail. Requires extra time for upper body and vc/ tc required for technique especially with use of BUE for push with R elbow and L hand to push up from sidelying to reach seated position. To lie down. Pt requires setup of bed linens to complete. Then Min A for improved positioning toward HOB.  Transfers: Patient performed STS to no AD with heavy vc initially for technique with hands to thighs and CGA. Improves throughout session to close supervision to no AD and minimal vc. Hands to RW for UE support for SPVT transfers or reaching. SPVT transfers performed throughout session with CGA/ sup and vc for technique including maintaining walker position in front of pt.  Gait Training:  Patient ambulated 225' x1/ 155' x1 using RW with CGA/ supervision with w/c follow for safety/ fatigue. Demonstrated flexed forward posture, heavy BUE pressure into walker. Provided vc/ tc for upright posture, level gaze and improved proximity to walker for improved quality of gait.  Neuromuscular Re-ed: NMR facilitated during session with focus on standing balance. Pt guided in static and standing balance activity starting on blue wedge with static  stance and progressing into self perturbations with changes in arm positioning. Pt with largest offset in balance with change from straight arm position in anterior horizontal flexion to shoulder IR and hands behind lower back requiring Min A to maintain balance. Otherwise, pt shows fair to good use of ankle strategy to maintain. Progressed to dynamic stepping with use of 4# weighted bar in R hand to tap numbered discs on tray table at high waist height at 48feet, then 35ft, then 35ft distances. No LOB and pt able to take correct step with RLE to reach and maintain balance. Pt having trouble with finding colors of discs but can eaisly find numbers on discs. NMR performed for improvements in motor control and coordination, balance, sequencing, judgement, and self confidence/ efficacy in performing all aspects of mobility at highest level of independence.   Patient supine in bed at end of session with bed alarm set, and all needs within reach.   Therapy Documentation Precautions:  Precautions Precautions: Fall Precaution Comments: R side inattention Restrictions Weight Bearing Restrictions: No  Therapy/Group: Individual Therapy  Alger Simons PT, DPT 07/30/2020, 5:35 PM

## 2020-07-30 NOTE — Progress Notes (Signed)
Speech Language Pathology Daily Session Note  Patient Details  Name: Edwin Wall MRN: [id number] Date of Birth: [birth year]  Today's Date: 07/30/2020 SLP Individual Time: 0920-1000 SLP Individual Time Calculation (min): 40 min  Short Term Goals: Week 2: SLP Short Term Goal 1 (Week 2): Patient will correct errors in written language at word level with modA cues. SLP Short Term Goal 2 (Week 2): Patient will describe object function for common objects, at phrase level with minA cues. SLP Short Term Goal 3 (Week 2): Patient will verbally or in written form, describe at phrase level with modA cues for correcting errors. SLP Short Term Goal 4 (Week 2): Patient will answer mildly complex yes/no questions with 80% accuracy and modA. SLP Short Term Goal 5 (Week 2): Patient will demonstrate awareness to errors at word level written responses with modA.  Skilled Therapeutic Interventions: Skilled SLP intervention focused on expressive language. Pt wrote functional information such as name address, wife's name, date with min A verbal cues. He required mod A with visual models for writing to dictation with less familiar words and information. Max A verbal cues with carrier phrases to describe objects and function. Some improvement in generative naming when given personal clues from wife however pt often still unable to recall simple items with personally relevant clues. Pt left seated upright in chair with chair alarm set and call button within reach. Cont with therapy per plan of care.      Pain Pain Assessment Pain Scale: Faces Pain Score: 0-No pain Faces Pain Scale: No hurt  Therapy/Group: Individual Therapy  Darrol Poke Cole Klugh 07/30/2020, 11:45 AM

## 2020-07-30 NOTE — Progress Notes (Signed)
Physical Therapy Session Note  Patient Details  Name: Edwin Wall MRN: [id number] Date of Birth: 1940-03-18  Today's Date: 07/30/2020 PT Individual Time: 0800-0900 PT Individual Time Calculation (min): 60 min   Short Term Goals: Week 1:  PT Short Term Goal 1 (Week 1): pt to demonstrate supine<>sit CGA PT Short Term Goal 2 (Week 1): pt to demonstrate functional transfers CGA PT Short Term Goal 3 (Week 1): pt to demonstrate gait 200' CGA with good awareness of Rt side  Skilled Therapeutic Interventions/Progress Updates:  Pt resting in bed.  He denied pain.  PT donned TEDS and started hospital pants on LLs; pt pulled them up in supine and bridged up hips to pull all the way over bottom.  neuromuscular re-education via multimodal cues, forced use and wt bearing;  in supine- alternating ankle pumps, in hook lying- cervical flexion, R hip rotation, bil lower trunk rotation, bil bridging, bil hip abduction/adduction.  With cueing, pt counted aloud to avoid Valsalva.  In sitting, R lateral leans, and reciprocal scooting forward/backward without use of hands,  with assistance.  Bed mobility training in flat bed, no rails: blocked practice supine>< R side lying with flexed knees and hips; R side lying > sitting with min assist for trunk elevation.    Transfer training: blocked practice sit>< stand from elevated bed without use of UEs, CGA.  Bed> wc to L with close supervision.  Seated in armchair, forward lean to tap clasped hands on vertical surface at shoulder level.    Gait training on level tile with RW x 250' with CG/supervision, with cues for forward gaze, upright posture and attention to environment; dual task of scanning to R to read aloud room numbers on R, 100% accurate x 8 rooms. Up/down 12 steps 2 rails, with self selected step- through method, close supervision.  At end of session, pt seated in wc with needs at hand and seat belt alarm set.      Therapy  Documentation Precautions:  Precautions Precautions: Fall Precaution Comments: R side inattention Restrictions Weight Bearing Restrictions: No          Therapy/Group: Individual Therapy  [name] 07/30/2020, 10:22 AM

## 2020-07-30 NOTE — Progress Notes (Signed)
PROGRESS NOTE   Subjective/Complaints:  Pt reports not good this AM- food isn't good this AM.  Slept well.    ROS: limited due to aphasia   Objective:   No results found. Recent Labs    07/30/20 0500  WBC 10.3  HGB 15.5  HCT 48.6  PLT 300   Recent Labs    07/30/20 0500  NA 141  K 4.0  CL 110  CO2 24  GLUCOSE 137*  BUN 26*  CREATININE 1.64*  CALCIUM 9.1    Intake/Output Summary (Last 24 hours) at 07/30/2020 0925 Last data filed at 07/30/2020 0756 Gross per 24 hour  Intake 658 ml  Output --  Net 658 ml        Physical Exam: Vital Signs Blood pressure 109/78, pulse 71, temperature 98 F (36.7 C), resp. rate 17, weight 104.1 kg, SpO2 96 %.     General: awake, alert, appropriate, sitting up in bed; pushed breakfast away; NAD HENT: conjugate gaze; oropharynx moist CV: regular rate; no JVD Pulmonary: CTA B/L; no W/R/R- good air movement GI: soft, NT, ND, (+)BS Psychiatric: more interactive but flat Neurological: alert, very aphasic, but speaking at word level.  Ext: no clubbing, cyanosis, or edema Neuro: MAS of 1 in R hip/knee Skin: Warm and dry.  Intact. Musc: No edema in extremities.  No tenderness in extremities. Word finding deficits. Seem to be able to convey thoughts to me. Motor exam remains somewhat limited due to participation, however >/4/5 throughout RUE and RLE --stable  Assessment/Plan: 1. Functional deficits which require 3+ hours per day of interdisciplinary therapy in a comprehensive inpatient rehab setting.  Physiatrist is providing close team supervision and 24 hour management of active medical problems listed below.  Physiatrist and rehab team continue to assess barriers to discharge/monitor patient progress toward functional and medical goals  Care Tool:  Bathing    Body parts bathed by patient: Right arm,Left arm,Chest,Abdomen,Front perineal area,Buttocks,Left upper  leg,Right upper leg,Face     Body parts n/a: Left lower leg,Right lower leg   Bathing assist Assist Level: Minimal Assistance - Patient > 75%     Upper Body Dressing/Undressing Upper body dressing   What is the patient wearing?: Pull over shirt    Upper body assist Assist Level: Supervision/Verbal cueing    Lower Body Dressing/Undressing Lower body dressing      What is the patient wearing?: Pants     Lower body assist Assist for lower body dressing: Minimal Assistance - Patient > 75%     Toileting Toileting    Toileting assist Assist for toileting: Maximal Assistance - Patient 25 - 49%     Transfers Chair/bed transfer  Transfers assist     Chair/bed transfer assist level: Contact Guard/Touching assist     Locomotion Ambulation   Ambulation assist      Assist level: Minimal Assistance - Patient > 75% Assistive device: Walker-rolling Max distance: 10'   Walk 10 feet activity   Assist     Assist level: Minimal Assistance - Patient > 75% Assistive device: Walker-rolling   Walk 50 feet activity   Assist    Assist level: Minimal Assistance - Patient > 75%  Assistive device: Walker-rolling    Walk 150 feet activity   Assist Walk 150 feet activity did not occur: Safety/medical concerns         Walk 10 feet on uneven surface  activity   Assist Walk 10 feet on uneven surfaces activity did not occur: Safety/medical concerns         Wheelchair     Assist Will patient use wheelchair at discharge?: Yes (Per PT long term goals) Type of Wheelchair: Manual    Wheelchair assist level: Moderate Assistance - Patient 50 - 74% Max wheelchair distance: 150    Wheelchair 50 feet with 2 turns activity    Assist        Assist Level: Moderate Assistance - Patient 50 - 74%   Wheelchair 150 feet activity     Assist      Assist Level: Moderate Assistance - Patient 50 - 74%   Blood pressure 109/78, pulse 71, temperature 98 F  (36.7 C), resp. rate 17, weight 104.1 kg, SpO2 96 %.  Medical Problem List and Plan: 1.  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  CT scan showing expected evolution of left MCA infarct with?  Petechial hemorrhage.   5/30- con't PT, OT and SLP 2.  Antithrombotics: -DVT/anticoagulation: Awaiting plan transition to Eliquis             -antiplatelet therapy: Currently on aspirin 325 mg daily awaiting transition to Eliquis for stroke prevention  5/23- has petechial hemorrhage possibly-  Started on Eliquis Friday- will monitor 3. Pain Management: Tylenol as needed  5/27- tiny bit of foot pain B/L- con't tylenol prn  5/30- having pain intermittently- con't regimen 4. Mood: Ativan 1 mg daily.  Provide emotional support             -antipsychotic agents: [name] 5. Neuropsych: This patient is? capable of making decisions on his own behalf. 6. Skin/Wound Care: Routine skin checks 7. Fluids/Electrolytes/Nutrition: Routine in and outs 8.  Hypertension  Relatively controlled on 5/29  5/30- BP slightly soft- con't to monitor             Monitor increase activity 9.  Hyperlipidemia.  Crestor/Colestid 10.  New onset atrial fibrillation.  Cardiac rate controlled.  Await plan to begin Eliquis  5/23- on Eliquis- stable rhythm/irregular- con't regimen             Monitor with increased activity 11.  Diabetes mellitus type 2 with hyperglycemia.  Hemoglobin A1c 6.6 2018 and 7.1 on this admission.  He was on no glucose lowering agents at home.  Currently SSI.  Await plan to possibly begin metformin.  Started Farxiga 5mg  daily per pharmacy   Slightly labile on 5/22, would not make any further adjustments today  5/23- somewhat labile, but will try to increase  [place] tomorrow to 10 mg daily.   5/24- increased to 10 mg daily- will monitor  5/25- BGs still an issue- will call DM coordinators- family says they wouldn't be able to do insulin easily.  5/26- spoke with DM coordinator- since pt's age as well as recent stroke, they feel control is adequate and agree no insulin or changes to regimen             5/30-  overall adequate control- con't regimen 12.  History of colon cancer with colectomy 2018.  Follow-up outpatient 13.  Obesity.  BMI 36.24.  Dietary follow-up 14.  AKI/CKD3B.    Creatinine 1.68 on 5/19, labs ordered for tomorrow  5/26- Cr 1.58- doing slightly better- con't to monitor  5/30- Cr 1.64- basically stable- con't to monitor  Continue to monitor 15.  Decreased function - patient likely wax/wanes as previously noted to be extremely somnolent on acute floor.   CT head ordered, essentially showing expected evolution.  WBCs elevated at 11.5 on 5/22, however this appears to be within patient's range.  No fevers or signs/symptoms of infection.  UA/Ucx pending  Blood cultures ordered  5/23- U/A (-) except small Hb. No growth on blood Cx's so far- WBC up to 12.6- will also check CXR and looks better today- so will monitor and recheck in AM  5/24- WBC stable at 12.4- UCx (-); CXR shows some possible opacity diffuse/mild- atypical? Will call IM to help out.  Blood Cx's (-) so far.    5/25- blood Cx's still negative- spoke with IM and they agree could be atypical walking pneumonia wife mentioned that was coughing for 1 month prior to admission. Trying to get resp Cx- have asked if Respiratory can be called to get it for [place] since not coughing now. Labs in AM- will try Zpack 500 mg x1 and then 4 days of 250 mg since getting Cx so hard and this is only things that makes sense, based on CXR.   5/26- pt appears more awake and WBC down to 10.8 from 12.4- so think we chose right to treat-  5/29-mycoplasma cx still active/pending. Sx appear improved 16.  Constipation  5/23- will order sorbitol if hasn't ha BM- documented in computer 5 days ago?  5/24- no BM documented- will give another 60cc of Sorbitol- if no results, will check KUB/Mg citrate?  5/28--->2 large bm's     LOS: 12 days A FACE TO FACE EVALUATION WAS PERFORMED  Takeisha Cianci 07/30/2020, 9:25 AM

## 2020-07-30 NOTE — Progress Notes (Signed)
Occupational Therapy Session Note  Patient Details  Name: Edwin Wall MRN: [id number] Date of Birth: Mar 16, 1940  Today's Date: 07/30/2020 OT Individual Time: 0814-4818 OT Individual Time Calculation (min): 42 min    Short Term Goals: Week 1:  OT Short Term Goal 1 (Week 1): STGs = LTGs OT Short Term Goal 1 - Progress (Week 1): Progressing toward goal Week 2:  OT Short Term Goal 1 (Week 2): STGs = LTGs d/t ELOS at Supervision   Skilled Therapeutic Interventions/Progress Updates:    Pt greeted at time of session supine in bed resting wife present, agreeable to OT session. Discussion at beginning of session regarding DC planning, potentially moving up DC date, DME needs, etc. Wife had questions regarding OT goals, relayed that pt has Supervision goals and is limited by cognition/safety at times. Supine > sit Supervision, ambulated room <> gym with close supervision/CGA with RW and performed dynamic standing with card matching in gym 4:07 standing crossing midline and only 3 mistakes during matching. Practiced x1 walk in shower transfer with posterior entry, CGA for transfer but Mod cues for sequencing and following commands. Back in room, sit > supine Supervision alarm on call bell in reach.    Therapy Documentation Precautions:  Precautions Precautions: Fall Precaution Comments: R side inattention Restrictions Weight Bearing Restrictions: No     Therapy/Group: Individual Therapy  [name] 07/30/2020, 7:25 AM

## 2020-07-31 LAB — GLUCOSE, CAPILLARY
Glucose-Capillary: 122 mg/dL — ABNORMAL HIGH (ref 70–99)
Glucose-Capillary: 154 mg/dL — ABNORMAL HIGH (ref 70–99)
Glucose-Capillary: 163 mg/dL — ABNORMAL HIGH (ref 70–99)
Glucose-Capillary: 129 mg/dL — ABNORMAL HIGH (ref 70–99)

## 2020-07-31 MED ORDER — AMANTADINE HCL 100 MG PO CAPS
200.0000 mg | ORAL_CAPSULE | Freq: Every day | ORAL | Status: DC
  Administered 2020-07-31 – 2020-08-03 (×4): 200 mg via ORAL
  Filled 2020-07-31 (×4): qty 2

## 2020-07-31 MED ORDER — SORBITOL 70 % SOLN
30.0000 mL | Freq: Once | Status: AC
  Administered 2020-07-31: 30 mL via ORAL
  Filled 2020-07-31: qty 30

## 2020-07-31 MED ORDER — SENNOSIDES-DOCUSATE SODIUM 8.6-50 MG PO TABS
1.0000 | ORAL_TABLET | Freq: Two times a day (BID) | ORAL | Status: DC
  Administered 2020-07-31 – 2020-08-03 (×6): 1 via ORAL
  Filled 2020-07-31 (×6): qty 1

## 2020-07-31 NOTE — Progress Notes (Signed)
Physical Therapy Session Note  Patient Details  Name: Edwin Wall MRN: [id number] Date of Birth: [birth year]  Today's Date: 07/31/2020 PT Individual Time: 1115-1200 and 1400-1430 PT Individual Time Calculation (min): 45 min and 30 min  Short Term Goals: Week 1:  PT Short Term Goal 1 (Week 1): pt to demonstrate supine<>sit CGA PT Short Term Goal 2 (Week 1): pt to demonstrate functional transfers CGA PT Short Term Goal 3 (Week 1): pt to demonstrate gait 200' CGA with good awareness of Rt side Week 2:    Week 3:     Skilled Therapeutic Interventions/Progress Updates:   AM SESSION  Pain:  Pt reports no pain.  Treatment to tolerance.  Rest breaks and repositioning as needed.  Pt initially oob in recliner and agreeable to treatment session w/focus on functional gait. Sit to stand w/supervision to RW.  Gait 143ft w/cues for increased step height/length on R.  Cues to attend to R environment, collides w/R side of doorway when entering gym w/limited attention to this occurring.  Gait x 67ft w/RW while scanning R environment to locate and retrieve using R hand colored/numbered disks along path, cga, cues as above for step length/height R.   Gait x 12ft stepping over obstacles (various dumbells) placed every 6 feet w/R foot, cga, clears obstacles safely.  Repeated x 2 to encourage step length, height, R hemibody attention.  Stairs: ascends/descends 4 stairs w/2 rails w/additional time for placement of R hand on rail, cga, step over step to ascend, step to to descend.  Gait 164ft to room w/Rw, cues as above, w/fatigue gait takes on more shuffling quality.    PM SESSION:  Pain:  Pt reports no pain.  Treatment to tolerance.  Rest breaks and repositioning as needed.  Pt initially oob in recliner and agreeable to treatment session w/focus on gait training. Sit to stand w/cga, good wtshift w/task.  Gait 328ftx1, 355ft x1 w/RW, occasional cues to increase R clearance/step length but  improved from am session, minimal cues to attend to R environment, gait performed in wide hallway but did recognize several obstacles during gait.  Standing eggcrate puzzles - able to complete 2 full puzzles in standing using RUE w/only 2 errors, corrects w/attention to error.  Pt c/o urgent bowels.  Gait 10 ft to commode in hurried manner w/RW, cga  Commode transfer w/cga and RW.  Wife agreed to supervise pt while therapist called NT.  NT notified and agreed to supervise/assist pt.  Educated NT re: pt decreased safety awareness and need for supervision.   Therapy Documentation Precautions:  Precautions Precautions: Fall Precaution Comments: R side inattention Restrictions Weight Bearing Restrictions: No    Therapy/Group: Individual Therapy  Callie Fielding, [place] 07/31/2020, 11:51 AM

## 2020-07-31 NOTE — Progress Notes (Signed)
Occupational Therapy Session Note  Patient Details  Name: Edwin Wall MRN: [id number] Date of Birth: Aug 19, 1940  Today's Date: 07/31/2020 OT Individual Time: 3338-3291 OT Individual Time Calculation (min): 72 min    Short Term Goals: Week 1:  OT Short Term Goal 1 (Week 1): STGs = LTGs OT Short Term Goal 1 - Progress (Week 1): Progressing toward goal Week 2:  OT Short Term Goal 1 (Week 2): STGs = LTGs d/t ELOS at Supervision  Skilled Therapeutic Interventions/Progress Updates:    Pt greeted at time of session supine in bed resting agreeable to OT session. Supine > sit CGA from flat bed and extended time, ambulated bed <> bathroom CGA/close supervision with RW and transferred to shower bench same manner. Bathing shower level Supervision except CGA for standing balance for washing buttocks, note 1 LOB with pt able to recover with grab bars. Cues for sequencing throughout shower, attempting to just rinse with water and forgetting to use soap, cues to move on to next body part. Dried off same manner before walking short distance to toilet CGA to finish drying and dressing. UB dress set up, LB dress total A for brief but CGA for underwear (worn over brief) and pants with cues for hemitechnique. Donned socks/shoes with Mod A overall assist moreso with socks, plan to trial sock aide. Trialed figure four and propping on bed, still needed assist. Ambulated bed > sink for oral hygiene/grooming CGA before walking room <> day room with CGA/close supervision with RW. Back in room set up in recliner alarm on call bell in reach.   Therapy Documentation Precautions:  Precautions Precautions: Fall Precaution Comments: R side inattention Restrictions Weight Bearing Restrictions: No    Therapy/Group: Individual Therapy  [name] 07/31/2020, 7:10 AM

## 2020-07-31 NOTE — Progress Notes (Signed)
Physical Therapy Session Note  Patient Details  Name: LEORY ALLINSON MRN: [id number] Date of Birth: 05/20/1940  1000-1055am 33 Mins  Short Term Goals: Week 1:  PT Short Term Goal 1 (Week 1): pt to demonstrate supine<>sit CGA PT Short Term Goal 2 (Week 1): pt to demonstrate functional transfers CGA PT Short Term Goal 3 (Week 1): pt to demonstrate gait 200' CGA with good awareness of Rt side  Skilled Therapeutic Interventions/Progress Updates:     Patient received sitting up in recliner, wife at bedside, agreeable to PT. He denies pain. Patient ambulate to ambulate to therapy gym with RW and CGA. Verbal cues to increase B foot clearance, upright posture and remaining within walker frame. Patient maintaining slow, but steady gait speed, which he reports is near his premorbid gait speed. Patient completing dynamic standing balance tasks, first on solid ground progressing to standing on foam chest press + shoulder press with 4# dowel. Patient requesting frequent rest breaks, but denies fatigue or pain- stating he "just wants to sit." NuStep completed x10 mins for increased large amplitude reciprocal movement. Patient returning to room ambulating with RW and CGA and noticeably increased B foot clearance. Patient remaining up in recliner, chair alarm on, call light within reach.    Therapy Documentation Precautions:  Precautions Precautions: Fall Precaution Comments: R side inattention Restrictions Weight Bearing Restrictions: No    Therapy/Group: Individual Therapy  Karoline Caldwell, PT, DPT, CBIS  07/31/2020, 8:26 AM

## 2020-07-31 NOTE — Progress Notes (Signed)
Patient ID: Edwin Wall, male   DOB: 03-26-1940, 80 y.o.   MRN: [id number]  Met with pt and wife to update regarding team conference goals of supervision to CGA level. Pt has made much progress this week and team felt can up his discharge date to this Friday 6/3. Wife and pt were very pleased with and agreeable. Discussed equipment and follow up therapies. Wife reports they live close to the hospital so [facility] is the closest to them. Have faxed referral to them and ordered equipment via [employer]. Prepare for discharge Friday, wife to participate in therapies between now and then.

## 2020-07-31 NOTE — Patient Care Conference (Signed)
Inpatient RehabilitationTeam Conference and Plan of Care Update Date: 07/31/2020   Time: 11:34 AM    Patient Name: Edwin Wall      Medical Record Number: [id number]  Date of Birth: November 24, 1940 Sex: Male         Room/Bed: 4M03C/4M03C-01 Payor Info: Payor: MEDICARE / Plan: MEDICARE PART A AND B / Product Type: *No Product type* /    Admit Date/Time:  07/18/2020  5:09 PM  Primary Diagnosis:  Left middle cerebral artery stroke [facility])  Hospital Problems: Principal Problem:   Left middle cerebral artery stroke ([place]) Active Problems:   Type II diabetes mellitus ([place])   New onset atrial fibrillation ([place])   Aphasia   Acute right hemiparesis ([place])   Leukocytosis   Somnolence    Expected Discharge Date: Expected Discharge Date: 08/03/20  Team Members Present: Physician leading conference: Dr. Courtney Heys Care Coodinator Present: Ovidio Kin, LCSW;Garry Nicolini Creig Hines, RN, BSN, CRRN Nurse Present: Dorthula Nettles, RN PT Present: Excell Seltzer, PT OT Present: Lillia Corporal, OT SLP Present: Charolett Bumpers, SLP PPS Coordinator present : Gunnar Fusi, SLP     Current Status/Progress Goal Weekly Team Focus  Bowel/Bladder   Pt is incontinent of bowel and bladder  Pt will gain continence of bowel and bladder  Will toilet pt q2h and as needed   Swallow/Nutrition/ Hydration             ADL's   CGA/close supervision transfers (decreased safety) w/ RW, Min A LB dress, fucntional mobility CGA, aphasia limiting, impulsive at times  Supervision  family ed/training with wife, safety awareness, RW education and training, ADL transfers, R side NMR, standing balance/tolerance   Mobility   CGA overall with RW  supervision  safety, attention, balance, transfers, gait, bed mobility   Communication   Mod A  minA expressive and receptive language basic level  error awareness, naming, writting   Safety/Cognition/ Behavioral Observations  Min A  supervision basic safety/awareness  basic problem  solving and error awareness   Pain   Pt is pain free  Pt will remain pain free  Will assess qshift and PRN   Skin   Skin is intact  Pt skin will remain intact  Will assess skin qshift and PRN     Discharge Planning:  Home with wife who is able to assist but not heavy physical care. Wife is here daily and if allowed will participate in therapies   Team Discussion: Bowels don't want to work, ordered Sorbitol. Is on Farxiga for diabetes, diabetes coordinator said that is good for now. Incontinent B/B, no other nursing issues to report. To go home with wife, son has severe OCD and not sure how much assistance he will be. Patient on target to meet rehab goals: yes, contact guard to close supervision with RW. Min assist to contact guard for lower body bathing and dressing. Can be impulsive. Has supervision goals. Contact guard overall and walking > 200 ft. Supervision level goals. SLP reports min/mod assist and working on awareness of errors.  *See Care Plan and progress notes for long and short-term goals.   Revisions to Treatment Plan:  MD to increase Amantadine, give Sorbitol for bowels and increase bowel medications.  Teaching Needs: Family education, medication management, bowel management, transfer training, gait training, balance training, endurance training, safety awareness.  Current Barriers to Discharge: Decreased caregiver support, Medical stability, Home enviroment access/layout, Incontinence, Neurogenic bowel and bladder, Lack of/limited family support, Weight, Medication compliance and Behavior  Possible Resolutions to  Barriers: Continue current medications, provide emotional support.     Medical Summary Current Status: LBM 5/28 with a lot of intervention- incontinent- no skin issues- still a little lethargic-  Barriers to Discharge: Decreased family/caregiver support;Home enviroment access/layout;Incontinence;Neurogenic Bowel & Bladder;Medical stability;Other (comments)   Barriers to Discharge Comments: wife can do some physical care-not heavy duty?- back to CGA- close Supervision- walking well with RW- poor safety awareness Possible Resolutions to Barriers/Weekly Focus: will increase amantadine for speech/lethargy; will give sorbitol again for bowels and increase bowel meds.- SLP closer to goal level/Min A; 6/3- d/c will need BSC and RW and outpt PT/OT/SLP-   Continued Need for Acute Rehabilitation Level of Care: The patient requires daily medical management by a physician with specialized training in physical medicine and rehabilitation for the following reasons: Direction of a multidisciplinary physical rehabilitation program to maximize functional independence : Yes Medical management of patient stability for increased activity during participation in an intensive rehabilitation regime.: Yes Analysis of laboratory values and/or radiology reports with any subsequent need for medication adjustment and/or medical intervention. : Yes   I attest that I was present, lead the team conference, and concur with the assessment and plan of the team.   Cristi Loron 07/31/2020, 5:40 PM

## 2020-07-31 NOTE — Progress Notes (Signed)
PROGRESS NOTE   Subjective/Complaints:  Pt reports no pain today- LBM 5/28 x2- if no BM by 3pm, will order sorbitol.  No other comp[laints.     ROS: limited by aphasia   Objective:   No results found. Recent Labs    07/30/20 0500  WBC 10.3  HGB 15.5  HCT 48.6  PLT 300   Recent Labs    07/30/20 0500  NA 141  K 4.0  CL 110  CO2 24  GLUCOSE 137*  BUN 26*  CREATININE 1.64*  CALCIUM 9.1    Intake/Output Summary (Last 24 hours) at 07/31/2020 0825 Last data filed at 07/30/2020 1742 Gross per 24 hour  Intake 60 ml  Output 0 ml  Net 60 ml        Physical Exam: Vital Signs Blood pressure 124/88, pulse 87, temperature 98.6 F (37 C), resp. rate 18, weight 104.1 kg, SpO2 98 %.      General: awake, alert, appropriate, sitting up eating breakfast- said it's better" today; NAD HENT: conjugate gaze; oropharynx moist CV: regular rate- irregular rhythm; no JVD Pulmonary: CTA B/L; no W/R/R- good air movement GI: soft, NT, ND, (+)BS Psychiatric: appropriate- more awake than last week Neurological: alert- aphasic- speaking today at short phrase level Ext: no clubbing, cyanosis, or edema Neuro: MAS of 1 in R hip/knee Skin: Warm and dry.  Intact. Musc: No edema in extremities.  No tenderness in extremities. Word finding deficits. Seem to be able to convey thoughts to me. Motor exam remains somewhat limited due to participation, however >/4/5 throughout RUE and RLE --stable  Assessment/Plan: 1. Functional deficits which require 3+ hours per day of interdisciplinary therapy in a comprehensive inpatient rehab setting.  Physiatrist is providing close team supervision and 24 hour management of active medical problems listed below.  Physiatrist and rehab team continue to assess barriers to discharge/monitor patient progress toward functional and medical goals  Care Tool:  Bathing    Body parts bathed by  patient: Right arm,Left arm,Chest,Abdomen,Front perineal area,Buttocks,Left upper leg,Right upper leg,Face     Body parts n/a: Left lower leg,Right lower leg   Bathing assist Assist Level: Minimal Assistance - Patient > 75%     Upper Body Dressing/Undressing Upper body dressing   What is the patient wearing?: Pull over shirt    Upper body assist Assist Level: Supervision/Verbal cueing    Lower Body Dressing/Undressing Lower body dressing      What is the patient wearing?: Pants     Lower body assist Assist for lower body dressing: Minimal Assistance - Patient > 75%     Toileting Toileting    Toileting assist Assist for toileting: Maximal Assistance - Patient 25 - 49%     Transfers Chair/bed transfer  Transfers assist     Chair/bed transfer assist level: Supervision/Verbal cueing     Locomotion Ambulation   Ambulation assist      Assist level: Contact Guard/Touching assist Assistive device: Walker-rolling Max distance: 250   Walk 10 feet activity   Assist     Assist level: Contact Guard/Touching assist Assistive device: Walker-rolling   Walk 50 feet activity   Assist    Assist level: Contact  Guard/Touching assist Assistive device: Walker-rolling    Walk 150 feet activity   Assist Walk 150 feet activity did not occur: Safety/medical concerns  Assist level: Contact Guard/Touching assist Assistive device: Walker-rolling    Walk 10 feet on uneven surface  activity   Assist Walk 10 feet on uneven surfaces activity did not occur: Safety/medical concerns         Wheelchair     Assist Will patient use wheelchair at discharge?: Yes (Per PT long term goals) Type of Wheelchair: Manual    Wheelchair assist level: Moderate Assistance - Patient 50 - 74% Max wheelchair distance: 150    Wheelchair 50 feet with 2 turns activity    Assist        Assist Level: Moderate Assistance - Patient 50 - 74%   Wheelchair 150 feet  activity     Assist      Assist Level: Moderate Assistance - Patient 50 - 74%   Blood pressure 124/88, pulse 87, temperature 98.6 F (37 C), resp. rate 18, weight 104.1 kg, SpO2 98 %.  Medical Problem List and Plan: 1.  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  CT scan showing expected evolution of left MCA infarct with?  Petechial hemorrhage.   5/31- Con't PT, OT, and SLP 2.  Antithrombotics: -DVT/anticoagulation: Awaiting plan transition to Eliquis             -antiplatelet therapy: Currently on aspirin 325 mg daily awaiting transition to Eliquis for stroke prevention  5/23- has petechial hemorrhage possibly-  Started on Eliquis Friday- will monitor 3. Pain Management: Tylenol as needed  5/31- no pain today- having intermittently- tylenol prn- con't regimen 4. Mood: Ativan 1 mg daily.  Provide emotional support             -antipsychotic agents: [name] 5. Neuropsych: This patient is? capable of making decisions on his own behalf. 6. Skin/Wound Care: Routine skin checks 7. Fluids/Electrolytes/Nutrition: Routine in and outs 8.  Hypertension  5/31- BP 120s/80s- well controlled- con't regimen             Monitor increase activity 9.  Hyperlipidemia.  Crestor/Colestid 10.  New onset atrial fibrillation.  Cardiac rate controlled.  Await plan to begin Eliquis  5/23- on Eliquis- stable rhythm/irregular- con't regimen  5/31- in Afib- rate controlled- con't regimen             Monitor with increased activity 11.  Diabetes mellitus type 2 with hyperglycemia.  Hemoglobin A1c 6.6 2018 and 7.1 on this admission.  He was on no glucose lowering agents at home.  Currently SSI.  Await plan to possibly begin metformin.  Started Farxiga 5mg  daily per  pharmacy   Slightly labile on 5/22, would not make any further adjustments today  5/23- somewhat labile, but will try to increase [place] tomorrow to 10 mg daily.   5/24- increased to 10 mg daily- will monitor  5/25- BGs still an issue- will call DM coordinators- family says they wouldn't be able to do insulin easily.  5/26- spoke with DM coordinator- since pt's age as well as recent stroke, they feel control is adequate and agree no insulin or changes to regimen  5/31- Better control- in 120-130s- con't regimen 12.  History of colon cancer with colectomy 2018.  Follow-up outpatient 13.  Obesity.  BMI 36.24.  Dietary follow-up 14.  AKI/CKD3B.    Creatinine 1.68 on 5/19, labs ordered for tomorrow  5/26- Cr 1.58- doing slightly better- con't to monitor  5/30- Cr 1.64- basically stable- con't to monitor  Continue to monitor 15.  Mycoplasma pneumonia  - patient likely wax/wanes as previously noted to be extremely somnolent on acute floor.   CT head ordered, essentially showing expected evolution.  WBCs elevated at 11.5 on 5/22, however this appears to be within patient's range.  No fevers or signs/symptoms of infection.  UA/Ucx pending  Blood cultures ordered  5/23- U/A (-) except small Hb. No growth on blood Cx's so far- WBC up to 12.6- will also check CXR and looks better today- so will monitor and recheck in AM  5/24- WBC stable at 12.4- UCx (-); CXR shows some possible opacity diffuse/mild- atypical? Will call IM to help out.  Blood Cx's (-) so far.    5/25- blood Cx's still negative- spoke with IM and they agree could be atypical walking pneumonia wife mentioned that was coughing for 1 month prior to admission. Trying to get resp Cx- have asked if Respiratory can be called to get it for [place] since not coughing now. Labs in AM- will try Zpack 500 mg x1 and then 4 days of 250 mg since getting Cx so hard and this is only things that makes sense, based on CXR.   5/26- pt appears more  awake and WBC down to 10.8 from 12.4- so think we chose right to treat-  5/29-mycoplasma cx still active/pending. Sx appear improved 16. Constipation  5/23- will order sorbitol if hasn't ha BM- documented in computer 5 days ago?  5/24- no BM documented- will give another 60cc of Sorbitol- if no results, will check KUB/Mg citrate?  5/28--->2 large bm's  5/31- LBM 3 days ago- needing assistance to go/ meds- will give sorbitol if no BM by 3pm. Will make senokot 1 tab BID since having difficulty going.  17. Aphasia/lethargy  5/31- will increase amantadine to 200 mg daily.     LOS: 13 days A FACE TO FACE EVALUATION WAS PERFORMED  Ferman Basilio 07/31/2020, 8:25 AM

## 2020-08-01 LAB — GLUCOSE, CAPILLARY
Glucose-Capillary: 127 mg/dL — ABNORMAL HIGH (ref 70–99)
Glucose-Capillary: 169 mg/dL — ABNORMAL HIGH (ref 70–99)
Glucose-Capillary: 106 mg/dL — ABNORMAL HIGH (ref 70–99)
Glucose-Capillary: 179 mg/dL — ABNORMAL HIGH (ref 70–99)

## 2020-08-01 MED ORDER — APIXABAN 5 MG PO TABS
5.0000 mg | ORAL_TABLET | Freq: Two times a day (BID) | ORAL | 0 refills | Status: DC
  Filled 2020-08-01: qty 60, 30d supply, fill #0

## 2020-08-01 MED ORDER — SENNOSIDES-DOCUSATE SODIUM 8.6-50 MG PO TABS
1.0000 | ORAL_TABLET | Freq: Two times a day (BID) | ORAL | 0 refills | Status: DC
  Filled 2020-08-01: qty 60, 30d supply, fill #0

## 2020-08-01 MED ORDER — ROSUVASTATIN CALCIUM 40 MG PO TABS
40.0000 mg | ORAL_TABLET | Freq: Every day | ORAL | 0 refills | Status: DC
  Filled 2020-08-01: qty 30, 30d supply, fill #0

## 2020-08-01 MED ORDER — AMANTADINE HCL 100 MG PO CAPS
200.0000 mg | ORAL_CAPSULE | Freq: Every day | ORAL | 0 refills | Status: DC
  Filled 2020-08-01: qty 60, 30d supply, fill #0

## 2020-08-01 MED ORDER — LORAZEPAM 1 MG PO TABS: 1.0000 mg | ORAL_TABLET | Freq: Every day | ORAL | Status: DC | PRN

## 2020-08-01 MED ORDER — COLESTIPOL HCL 1 G PO TABS
1.0000 g | ORAL_TABLET | Freq: Two times a day (BID) | ORAL | 0 refills | Status: DC
  Filled 2020-08-01: qty 30, 15d supply, fill #0

## 2020-08-01 MED ORDER — DAPAGLIFLOZIN PROPANEDIOL 10 MG PO TABS
10.0000 mg | ORAL_TABLET | Freq: Every day | ORAL | 0 refills | Status: DC
  Filled 2020-08-01: qty 30, 30d supply, fill #0

## 2020-08-01 NOTE — Progress Notes (Signed)
Physical Therapy Session Note  Patient Details  Name: Edwin Wall MRN: [id number] Date of Birth: 1940-12-02  Today's Date: 08/01/2020 PT Individual Time: 1415-1515 PT Individual Time Calculation (min): 60 min  PT Amount of Missed Time (min): 15 Minutes PT Missed Treatment Reason: Patient fatigue  Short Term Goals: Week 1:  PT Short Term Goal 1 (Week 1): pt to demonstrate supine<>sit CGA PT Short Term Goal 2 (Week 1): pt to demonstrate functional transfers CGA PT Short Term Goal 3 (Week 1): pt to demonstrate gait 200' CGA with good awareness of Rt side  Skilled Therapeutic Interventions/Progress Updates:    Pt received supine in bed asleep, arousable and agreeable to PT session. Bed mobility Supervision. Sit to stand with CGA to RW throughout session. Ambulation up to 300 ft with RW and close Supervision to CGA throughout session, cues for increased RLE clearance and attention to R visual field. Ascend/descend 4 x 6" stairs with 2 handrails and CGA for balance. Pt reports feeling that stairs were "rough" due to a bandage sticking to his lower leg. Returned to room, pt able to remove pants with Supervision. Pt found to have been incontinent of urine in brief and brief had fallen down his pants leg. Pt is dependent for brief change, setup A for peri hygiene. Pt's pants also found to be soiled, pt requires assist to don new pants due to trouble sequencing how to initiate donning of pants. Once new brief and pants donned pt able to return to therapy gym. Ascend/descend 8 x 6" stairs with 2 handrails and CGA, step-to gait pattern. Pt requires verbal and tactile cues for safety and sequencing of stairs. Static standing balance with no UE support and CGA while performing rebounder ball toss, 2 x 15 reps. Increased challenge by having pt stand on red therapy wedge with min to mod A needed for balance, no UE support, 2 x 15 reps. Pt then reports feeling fatigued and requests to return to his room. Sit  to supine with Supervision. Pt left supine in bed with needs in reach, bed alarm in place at end of session.  Therapy Documentation Precautions:  Precautions Precautions: Fall Precaution Comments: R side inattention Restrictions Weight Bearing Restrictions: No   Therapy/Group: Individual Therapy   Excell Seltzer, PT, DPT, CSRS  08/01/2020, 3:50 PM

## 2020-08-01 NOTE — Progress Notes (Signed)
Occupational Therapy Session Note  Patient Details  Name: Edwin Wall MRN: [id number] Date of Birth: [birth year]  Today's Date: 08/01/2020 OT Individual Time: 0917-1000 OT Individual Time Calculation (min): 43 min    Short Term Goals: Week 1:  OT Short Term Goal 1 (Week 1): STGs = LTGs OT Short Term Goal 1 - Progress (Week 1): Progressing toward goal Week 2:  OT Short Term Goal 1 (Week 2): STGs = LTGs d/t ELOS at Supervision  Skilled Therapeutic Interventions/Progress Updates:    Pt received in room with wide present and consented to OT tx. Pt seen for morning ADL routine including washing up at EOB, dressing, grooming and oral care standing sink side with SUP, and dynamic standing balance with functional mobility. After bathing and dressing with setup for UB ADLs, CGA for LB ADLs, and min A for footwear mgmt, pt walked with RW down to therapy gym and instructed in dynamic standing activities. Pt instructed in bean bag toss and horseshoe toss with no BUE support, maintaining good balance throughout. After balance activities, pt walked back to room with RW with close SUP and cuing for safety, left up in recliner with with wife at bedside and all needs met.   Therapy Documentation Precautions:  Precautions Precautions: Fall Precaution Comments: R side inattention Restrictions Weight Bearing Restrictions: No    Pain: Pain Assessment Pain Scale: 0-10 Pain Score: 0-No pain   Therapy/Group: Individual Therapy  [name] 08/01/2020, 11:27 AM

## 2020-08-01 NOTE — Discharge Summary (Signed)
Physician Discharge Summary  Patient ID: Edwin Wall MRN: [id number] DOB/AGE: 80-Jul-1942 80 y.o.  Admit date: 07/18/2020 Discharge date: 08/03/2020  Discharge Diagnoses:  Principal Problem:   Left middle cerebral artery stroke [facility]) Active Problems:   Type II diabetes mellitus ([place])   New onset atrial fibrillation (HCC)   Aphasia   Acute right hemiparesis (HCC)   Leukocytosis   Somnolence DVT prophylaxis Hypertension Hyperlipidemia History of colon cancer Obesity AKI/CKD  Discharged Condition: Stable  Significant Diagnostic Studies: CT HEAD WO CONTRAST  Result Date: 07/22/2020 CLINICAL DATA:  80 year old male with altered mental status. Recent code stroke presentation and anterior left MCA territory infarct. EXAM: CT HEAD WITHOUT CONTRAST TECHNIQUE: Contiguous axial images were obtained from the base of the skull through the vertex without intravenous contrast. COMPARISON:  Head CT 07/17/2020, brain MRI 07/15/2020. FINDINGS: Brain: Evolving anterior left MCA division infarct with decreased regional mass effect since 07/17/2020. Possible petechial hemorrhage. No malignant hemorrhagic transformation. Elsewhere stable gray-white matter differentiation throughout the brain. No new cortically based infarct. No midline shift or ventriculomegaly. Vascular: Mild Calcified atherosclerosis at the skull base. No suspicious intracranial vascular hyperdensity. Skull: No acute osseous abnormality identified. Sinuses/Orbits: Stable paranasal sinuses. Chronic right sphenoid sinusitis. Stable mild mastoid effusions. Other: Visualized orbits and scalp soft tissues are within normal limits. IMPRESSION: 1. Expected evolution of anterior Left MCA division infarct. Possible petechial hemorrhage but no malignant hemorrhagic transformation and mildly decreased regional mass effect since 07/17/2020. 2. No new intracranial abnormality. Electronically Signed   By: Genevie Ann M.D.   On: 07/22/2020 11:10   CT  HEAD WO CONTRAST  Result Date: 07/17/2020 CLINICAL DATA:  Follow-up CVA. EXAM: CT HEAD WITHOUT CONTRAST TECHNIQUE: Contiguous axial images were obtained from the base of the skull through the vertex without intravenous contrast. COMPARISON:  Head CT 07/15/2020 and MRI 07/13/2020. FINDINGS: Brain: Slight evolutionary change and the large left MCA territory infarct with more clearly defined area of low attenuation. No findings suspicious for acute hemorrhage. No new mass effect. No new infarcts are identified. Stable underlying age related cerebral atrophy and ventriculomegaly. Vascular: Stable vascular calcifications but no aneurysm or hyperdense vessels. Skull: No skull fracture or bone lesions. Sinuses/Orbits: Chronic right half sphenoid sinus disease. The paranasal sinuses and mastoid air cells are otherwise clear. The globes are intact. Other: No scalp lesions or scalp hematoma. IMPRESSION: 1. Slight evolutionary change in the large left MCA territory infarct. No findings suspicious for acute hemorrhage. No new infarcts. 2. Stable underlying age related cerebral atrophy and ventriculomegaly. 3. Chronic right half sphenoid sinus disease. Electronically Signed   By: Marijo Sanes M.D.   On: 07/17/2020 13:40   MR BRAIN WO CONTRAST  Result Date: 07/15/2020 CLINICAL DATA:  Neuro deficit, acute, stroke suspected. EXAM: MRI HEAD WITHOUT CONTRAST TECHNIQUE: Multiplanar, multiecho pulse sequences of the brain and surrounding structures were obtained without intravenous contrast. COMPARISON:  Noncontrast head CT, CT angiogram head/neck and CT perfusion performed earlier today 07/15/2020. FINDINGS: Brain: Mild cerebral and cerebellar atrophy. Acute cortical/subcortical infarct affecting the left frontal operculum, anterolateral left frontal lobe and left insula. The infarct measures up to 6.8 x 3.2 x 5.5 cm in greatest dimensions. No evidence of hemorrhagic conversion. No significant mass effect at this time.  Background mild multifocal T2/FLAIR hyperintensity within the cerebral white matter, nonspecific but compatible with chronic small vessel ischemic disease. No evidence of intracranial mass. No chronic intracranial blood products. No extra-axial fluid collection. No midline shift. Vascular: T2* signal loss at  site of a known left M2 MCA occlusion. Skull and upper cervical spine: No focal marrow lesion. Sinuses/Orbits: Visualized orbits show no acute finding. Trace scattered paranasal sinus mucosal thickening. Small right maxillary sinus mucous retention cyst. Other: Bilateral mastoid effusions IMPRESSION: 6.8 x 3.2 x 5.5 cm acute cortical/subcortical infarct affecting the left frontal operculum, anterolateral left frontal lobe and left insula (MCA vascular territory). No evidence of hemorrhagic conversion. No significant mass effect at this time. Background mild generalized parenchymal atrophy and cerebral white matter chronic small vessel ischemic disease. Bilateral mastoid effusions. Electronically Signed   By: Kellie Simmering DO   On: 07/15/2020 15:18   CT CEREBRAL PERFUSION W CONTRAST  Result Date: 07/15/2020 CLINICAL DATA:  Code stroke with abnormal head CT EXAM: CT ANGIOGRAPHY HEAD AND NECK CT PERFUSION BRAIN TECHNIQUE: Multidetector CT imaging of the head and neck was performed using the standard protocol during bolus administration of intravenous contrast. Multiplanar CT image reconstructions and MIPs were obtained to evaluate the vascular anatomy. Carotid stenosis measurements (when applicable) are obtained utilizing NASCET criteria, using the distal internal carotid diameter as the denominator. Multiphase CT imaging of the brain was performed following IV bolus contrast injection. Subsequent parametric perfusion maps were calculated using RAPID software. CONTRAST:  154mL OMNIPAQUE IOHEXOL 350 MG/ML SOLN COMPARISON:  Noncontrast CT from earlier today FINDINGS: CTA NECK FINDINGS Aortic arch: 2 vessel arch.   Mild atheromatous plaque Right carotid system: Low-density plaque at the bifurcation without common carotid or ICA stenosis. No ulceration or dissection. Left carotid system: Low-density plaque at the bifurcation without stenosis or ulceration. Vertebral arteries: Subclavian atherosclerosis. Codominant vertebral arteries that are smoothly contoured and widely patent to the dura. Skeleton: No acute finding. Severe cervical spine degeneration with ridging causing foraminal an cord impingement. Other neck: Negative Upper chest: Negative Review of the MIP images confirms the above findings CTA HEAD FINDINGS Anterior circulation: Mild atheromatous calcification at the siphons. Left M2 branch occlusion correlating with a hyperdensity by CT. No additional branch occlusion is seen. Negative for aneurysm. Posterior circulation: The vertebral and basilar arteries are smooth and widely patent. No branch occlusion, beading, or aneurysm. Venous sinuses: Negative Anatomic variants: Negative Review of the MIP images confirms the above findings CT Brain Perfusion Findings: ASPECTS: 6-7 CBF (<30%) Volume: 51mL Perfusion (Tmax>6.0s) volume: 35mL Mismatch Volume: 73mL Infarction Location:Anterior division left MCA territory Case discussed with Dr. Milas Gain while in progress. IMPRESSION: 1. Left M2 occlusion. CT perfusion reports 26 cc of core infarct with 17 cc of penumbra, but based on noncontrast head CT the penumbra of may be overestimated. 2. Overall mild atherosclerosis for age. No embolic source seen in the neck. Electronically Signed   By: Monte Fantasia M.D.   On: 07/15/2020 09:57   DG CHEST PORT 1 VIEW  Result Date: 07/23/2020 CLINICAL DATA:  WBC increasing, leukocytosis EXAM: PORTABLE CHEST 1 VIEW COMPARISON:  None. FINDINGS: Cardiomegaly. Low volume AP portable examination with mild, diffuse interstitial pulmonary opacity. The visualized skeletal structures are unremarkable. IMPRESSION: 1. Low volume AP portable examination  with mild, diffuse interstitial pulmonary opacity, which may reflect edema or atypical/viral infection but may be exaggerated by low volume AP portable technique. No focal airspace opacity. 2. Cardiomegaly. Electronically Signed   By: Eddie Candle M.D.   On: 07/23/2020 17:48   ECHOCARDIOGRAM COMPLETE  Result Date: 07/18/2020    ECHOCARDIOGRAM REPORT   Patient Name:   IDAN PRIME Date of Exam: 07/16/2020 Medical Rec #:  [id number]  Height:       69.5 in Accession #:    [id number]           Weight:       249.0 lb Date of Birth:  [birth year]           BSA:          2.279 m Patient Age:    26 years             BP:           142/90 mmHg Patient Gender: M                    HR:           68 bpm. Exam Location:  Inpatient Procedure: 2D Echo, Color Doppler, Cardiac Doppler and Intracardiac            Opacification Agent Indications:    Stroke I63.9  History:        Patient has no prior history of Echocardiogram examinations.                 Arrythmias:Atrial Fibrillation; Risk Factors:Hypertension,                 Dyslipidemia and Diabetes.  Sonographer:    Merrie Roof Referring Phys: [place]  1. Left ventricular ejection fraction, by estimation, is 50 to 55%. The left ventricle has low normal function. The left ventricle has no regional wall motion abnormalities. Left ventricular diastolic parameters are indeterminate.  2. Right ventricular systolic function is normal. The right ventricular size is normal. Tricuspid regurgitation signal is inadequate for assessing PA pressure.  3. Left atrial size was mildly dilated.  4. The mitral valve is grossly normal. Mild mitral valve regurgitation. No evidence of mitral stenosis.  5. The aortic valve is tricuspid. Aortic valve regurgitation is not visualized. No aortic stenosis is present.  6. The inferior vena cava is normal in size with <50% respiratory variability, suggesting right atrial pressure of 8 mmHg. Comparison(s): No prior  Echocardiogram. FINDINGS  Left Ventricle: Left ventricular ejection fraction, by estimation, is 50 to 55%. The left ventricle has low normal function. The left ventricle has no regional wall motion abnormalities. Definity contrast agent was given IV to delineate the left ventricular endocardial borders. The left ventricular internal cavity size was normal in size. There is no left ventricular hypertrophy. Left ventricular diastolic parameters are indeterminate. Right Ventricle: The right ventricular size is normal. No increase in right ventricular wall thickness. Right ventricular systolic function is normal. Tricuspid regurgitation signal is inadequate for assessing PA pressure. Left Atrium: Left atrial size was mildly dilated. Right Atrium: Right atrial size was normal in size. Pericardium: There is no evidence of pericardial effusion. Mitral Valve: The mitral valve is grossly normal. Mild mitral valve regurgitation. No evidence of mitral valve stenosis. Tricuspid Valve: The tricuspid valve is grossly normal. Tricuspid valve regurgitation is not demonstrated. No evidence of tricuspid stenosis. Aortic Valve: The aortic valve is tricuspid. There is mild aortic valve annular calcification. Aortic valve regurgitation is not visualized. No aortic stenosis is present. Aortic valve mean gradient measures 3.0 mmHg. Aortic valve peak gradient measures 4.7 mmHg. Aortic valve area, by VTI measures 2.37 cm. Pulmonic Valve: The pulmonic valve was not well visualized. Pulmonic valve regurgitation is not visualized. No evidence of pulmonic stenosis. Aorta: The aortic root and ascending aorta are structurally normal, with no evidence of dilitation. Venous: The inferior vena cava is  normal in size with less than 50% respiratory variability, suggesting right atrial pressure of 8 mmHg. IAS/Shunts: The atrial septum is grossly normal.  LEFT VENTRICLE PLAX 2D LVIDd:         4.63 cm LVIDs:         3.45 cm LV PW:         1.09 cm LV  IVS:        0.96 cm LVOT diam:     2.00 cm LV SV:         47 LV SV Index:   21 LVOT Area:     3.14 cm  RIGHT VENTRICLE             IVC RV S prime:     13.30 cm/s  IVC diam: 2.07 cm TAPSE (M-mode): 1.6 cm LEFT ATRIUM           Index LA diam:      4.10 cm 1.80 cm/m LA Vol (A4C): 86.1 ml 37.78 ml/m  AORTIC VALVE AV Area (Vmax):    2.34 cm AV Area (Vmean):   2.28 cm AV Area (VTI):     2.37 cm AV Vmax:           108.00 cm/s AV Vmean:          78.000 cm/s AV VTI:            0.200 m AV Peak Grad:      4.7 mmHg AV Mean Grad:      3.0 mmHg LVOT Vmax:         80.50 cm/s LVOT Vmean:        56.500 cm/s LVOT VTI:          0.151 m LVOT/AV VTI ratio: 0.75  AORTA Ao Root diam: 3.50 cm Ao Asc diam:  3.30 cm  SHUNTS Systemic VTI:  0.15 m Systemic Diam: 2.00 cm Rudean Haskell MD Electronically signed by Rudean Haskell MD Signature Date/Time: 07/18/2020/11:15:16 AM    Final    CT HEAD CODE STROKE WO CONTRAST  Result Date: 07/15/2020 CLINICAL DATA:  Code stroke.  Aphasia EXAM: CT HEAD WITHOUT CONTRAST TECHNIQUE: Contiguous axial images were obtained from the base of the skull through the vertex without intravenous contrast. COMPARISON:  None. FINDINGS: Brain: Cytotoxic edema in the anterior insula, anterior inferior frontal lobe, and high lateral frontal lobe. Borderline involvement of the putamen. No hemorrhage, hydrocephalus, or pre-existing infarct. Cerebral volume loss without specific pattern. Vascular: Dense left M2 branch at the level of infarct. Skull: Negative Sinuses/Orbits: Negative Other: These results were called by telephone at the time of interpretation on 07/15/2020 at 9:36 am to Dr Sal , who verbally acknowledged these results. ASPECTS [facility] Stroke Program Early CT Score) - Ganglionic level infarction (caudate, lentiform nuclei, internal capsule, insula, M1-M3 cortex): 4-5 - Supraganglionic infarction (M4-M6 cortex): 2 Total score (0-10 with 10 being normal): 6-7 IMPRESSION: 1. Acute infarct  involving the anterior division left MCA territory. ASPECTS is 6-7. 2. No acute hemorrhage. Electronically Signed   By: Monte Fantasia M.D.   On: 07/15/2020 09:40   CT ANGIO HEAD CODE STROKE  Result Date: 07/15/2020 CLINICAL DATA:  Code stroke with abnormal head CT EXAM: CT ANGIOGRAPHY HEAD AND NECK CT PERFUSION BRAIN TECHNIQUE: Multidetector CT imaging of the head and neck was performed using the standard protocol during bolus administration of intravenous contrast. Multiplanar CT image reconstructions and MIPs were obtained to evaluate the vascular anatomy. Carotid stenosis measurements (when applicable) are obtained utilizing  NASCET criteria, using the distal internal carotid diameter as the denominator. Multiphase CT imaging of the brain was performed following IV bolus contrast injection. Subsequent parametric perfusion maps were calculated using RAPID software. CONTRAST:  132mL OMNIPAQUE IOHEXOL 350 MG/ML SOLN COMPARISON:  Noncontrast CT from earlier today FINDINGS: CTA NECK FINDINGS Aortic arch: 2 vessel arch.  Mild atheromatous plaque Right carotid system: Low-density plaque at the bifurcation without common carotid or ICA stenosis. No ulceration or dissection. Left carotid system: Low-density plaque at the bifurcation without stenosis or ulceration. Vertebral arteries: Subclavian atherosclerosis. Codominant vertebral arteries that are smoothly contoured and widely patent to the dura. Skeleton: No acute finding. Severe cervical spine degeneration with ridging causing foraminal an cord impingement. Other neck: Negative Upper chest: Negative Review of the MIP images confirms the above findings CTA HEAD FINDINGS Anterior circulation: Mild atheromatous calcification at the siphons. Left M2 branch occlusion correlating with a hyperdensity by CT. No additional branch occlusion is seen. Negative for aneurysm. Posterior circulation: The vertebral and basilar arteries are smooth and widely patent. No branch  occlusion, beading, or aneurysm. Venous sinuses: Negative Anatomic variants: Negative Review of the MIP images confirms the above findings CT Brain Perfusion Findings: ASPECTS: 6-7 CBF (<30%) Volume: 27mL Perfusion (Tmax>6.0s) volume: 49mL Mismatch Volume: 33mL Infarction Location:Anterior division left MCA territory Case discussed with Dr. Milas Gain while in progress. IMPRESSION: 1. Left M2 occlusion. CT perfusion reports 26 cc of core infarct with 17 cc of penumbra, but based on noncontrast head CT the penumbra of may be overestimated. 2. Overall mild atherosclerosis for age. No embolic source seen in the neck. Electronically Signed   By: Monte Fantasia M.D.   On: 07/15/2020 09:57   CT ANGIO NECK CODE STROKE  Result Date: 07/15/2020 CLINICAL DATA:  Code stroke with abnormal head CT EXAM: CT ANGIOGRAPHY HEAD AND NECK CT PERFUSION BRAIN TECHNIQUE: Multidetector CT imaging of the head and neck was performed using the standard protocol during bolus administration of intravenous contrast. Multiplanar CT image reconstructions and MIPs were obtained to evaluate the vascular anatomy. Carotid stenosis measurements (when applicable) are obtained utilizing NASCET criteria, using the distal internal carotid diameter as the denominator. Multiphase CT imaging of the brain was performed following IV bolus contrast injection. Subsequent parametric perfusion maps were calculated using RAPID software. CONTRAST:  150mL OMNIPAQUE IOHEXOL 350 MG/ML SOLN COMPARISON:  Noncontrast CT from earlier today FINDINGS: CTA NECK FINDINGS Aortic arch: 2 vessel arch.  Mild atheromatous plaque Right carotid system: Low-density plaque at the bifurcation without common carotid or ICA stenosis. No ulceration or dissection. Left carotid system: Low-density plaque at the bifurcation without stenosis or ulceration. Vertebral arteries: Subclavian atherosclerosis. Codominant vertebral arteries that are smoothly contoured and widely patent to the dura.  Skeleton: No acute finding. Severe cervical spine degeneration with ridging causing foraminal an cord impingement. Other neck: Negative Upper chest: Negative Review of the MIP images confirms the above findings CTA HEAD FINDINGS Anterior circulation: Mild atheromatous calcification at the siphons. Left M2 branch occlusion correlating with a hyperdensity by CT. No additional branch occlusion is seen. Negative for aneurysm. Posterior circulation: The vertebral and basilar arteries are smooth and widely patent. No branch occlusion, beading, or aneurysm. Venous sinuses: Negative Anatomic variants: Negative Review of the MIP images confirms the above findings CT Brain Perfusion Findings: ASPECTS: 6-7 CBF (<30%) Volume: 78mL Perfusion (Tmax>6.0s) volume: 12mL Mismatch Volume: 72mL Infarction Location:Anterior division left MCA territory Case discussed with Dr. Milas Gain while in progress. IMPRESSION: 1. Left M2 occlusion. CT  perfusion reports 26 cc of core infarct with 17 cc of penumbra, but based on noncontrast head CT the penumbra of may be overestimated. 2. Overall mild atherosclerosis for age. No embolic source seen in the neck. Electronically Signed   By: Monte Fantasia M.D.   On: 07/15/2020 09:57   DG ESOPHAGUS W SINGLE CM (SOL OR THIN BA)  Result Date: 07/18/2020 CLINICAL DATA:  Dysphagia. Additional history provided: Acute on chronic trouble swallowing following acute stroke, also concern for aspiration with vomiting yesterday. EXAM: ESOPHOGRAM / BARIUM SWALLOW / BARIUM TABLET STUDY TECHNIQUE: A single contrast examination was performed using thick and thin barium liquid. The patient attempted to swallow a 13 mm barium sulphate tablet. FLUOROSCOPY TIME:  Fluoroscopy Time: 2 minutes, 24 seconds (33.5 mGy). Radiation Exposure Index (if provided by the fluoroscopic device): 33.50 mGy Number of Acquired Spot Images: 1 COMPARISON:  CT abdomen/pelvis 11/13/2016. FINDINGS: Somewhat limited evaluation due to limited  patient mobility. Fluoroscopic evaluation demonstrates a mildly patulous esophagus. No evidence of fixed stricture, mass or mucosal abnormality. Mild-to-moderate intermittent esophageal dysmotility with tertiary contractions. No hiatal hernia identified. No gastroesophageal reflux observed. The patient was unable to initiate swallowing of a 13 mm barium tablet. IMPRESSION: Somewhat limited evaluation due to limited patient mobility. Mildly patulous esophagus. 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 The patient was unable to initiate swallowing of a 13 mm barium tablet. Otherwise unremarkable esophagram, as described. Electronically Signed   By: Kellie Simmering DO   On: 07/18/2020 13:30    Labs:  Basic Metabolic Panel: Recent Labs  Lab 07/30/20 0500  NA 141  K 4.0  CL 110  CO2 24  GLUCOSE 137*  BUN 26*  CREATININE 1.64*  CALCIUM 9.1    CBC: Recent Labs  Lab 07/30/20 0500  WBC 10.3  NEUTROABS 6.6  HGB 15.5  HCT 48.6  MCV 95.7  PLT 300    CBG: Recent Labs  Lab 08/01/20 1149 08/01/20 1647 08/01/20 2056 08/02/20 0557 08/02/20 1132  GLUCAP 169* 106* 179* 129* 131*   Family history.  Mother with lung cancer Father with COPD.  Denies any colon cancer esophageal cancer or rectal cancer  Brief HPI:   Edwin Wall is a 80 y.o. right-handed male with history of prediabetes, hypertension, colon cancer with colectomy 2018, obesity as well as anxiety.  Patient lives with spouse independent prior to admission 1 level home 3 steps to entry.  Presented 07/15/2020 with acute onset of right-sided weakness dysarthria and facial droop.  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  No acute hemorrhage.  Patient did not receive tPA.  CT angiogram of head and neck showed M2 occlusion.  MRI of the brain follow-up showed a 6.8 x 3.2 x 5.5 acute cortical subcortical infarct affecting the left frontal operculum anterior lateral left  frontal insula.  No evidence of hemorrhagic conversion no significant mass-effect.  Admission chemistries unremarkable except glucose 155 creatinine 1.52 coronavirus negative hemoglobin A1c 7.1.  Hospital course further complicated by new onset atrial fibrillation cardiac rate controlled.  Echocardiogram with ejection fraction of 50 to 55% no wall motion abnormalities initially maintained on aspirin 325 mg awaiting plan to possibly transition to Eliquis for further stroke prevention currently was on hold due to large size of infarct.  Tolerating a regular consistency diet.  Therapy evaluations completed due to patient's right side weakness was admitted for a comprehensive rehab program.   Hospital Course: Edwin Wall was admitted to rehab 07/18/2020  for inpatient therapies to consist of PT, ST and OT at least three hours five days a week. Past admission physiatrist, therapy team and rehab RN have worked together to provide customized collaborative inpatient rehab.  Pertaining to patient's left MCA infarction with M2 occlusion likely embolic in the setting of new atrial fibrillation.  He was transition from aspirin to Eliquis.  No bleeding episodes.  Repeat head CT scan 07/22/2020 due to concern for progression and increasing lethargy showed expected evolution of left MCA infarction.  Mood stabilization with Ativan and emotional support provided.  Blood pressure controlled would need outpatient follow-up.  Cardiac rate monitored rate controlled continue Eliquis.  Diabetes mellitus peripheral neuropathy hemoglobin A1c 6.6 SSI started on farxiga 5 mg daily titrated as needed full diabetic teaching completed.  Patient with history of colon cancer colectomy 2018 follow-up outpatient.  Obesity BMI 36.24 dietary follow-up.  AKI/CKD creatinine stable 1.64 follow-up outpatient.  Mycoplasma pneumonia no fever blood cultures ordered currently no growth placed on Z-Pak.   Blood pressures were monitored on TID basis  and remained soft and monitored  Diabetes has been monitored with ac/hs CBG checks and SSI was use prn for tighter BS control.    Rehab course: During patient's stay in rehab weekly team conferences were held to monitor patient's progress, set goals and discuss barriers to discharge. At admission, patient required minimal assist 20 feet without assistive device minimal assist sit to stand minimal assist upper body bathing minimal assist lower body bathing minimal assist lower body dressing minimal assist lower body dressing  Physical exam.  Blood pressure 145/91 pulse 85 temperature 98 respirations 18 oxygen saturation 96% room air Constitutional.  No acute distress HEENT Head.  Normocephalic and atraumatic Eyes.  Pupils round and reactive to light no discharge.nystagmus Neck.  Supple nontender no JVD without thyromegaly Cardiac regular rate rhythm without extra sounds or murmur heard Abdomen.  Soft nontender positive bowel sounds not rebound Respiratory effort normal no respiratory distress without wheeze Musculoskeletal no edema or tenderness extremities Neurologic.  Somewhat lethargic but arousable motor Limited however able to move all extremities spontaneously  He/She  has had improvement in activity tolerance, balance, postural control as well as ability to compensate for deficits. He/She has had improvement in functional use RUE/LUE  and RLE/LLE as well as improvement in awareness.  Sit to stand contact-guard rolling walker ambulates 300 feet rolling walker close supervision.  Up-and-down stairs contact-guard assist.  Gather his belongings for activities day living and homemaking.  Patient did have some right-sided inattention needed some cues at times.  Full family teaching completed plan discharged home       Disposition: Discharged to home    Diet: Mechanical soft carb modified  Special Instructions: No driving smoking or alcohol  Medications at discharge 1.  Tylenol as  needed 2.  Amantadine 200 mg p.o. daily 3.  Eliquis 5 mg p.o. twice daily 4.  Colestid 1 g p.o. twice daily 5.  Crestor 40 mg p.o. daily  30-35 minutes were spent completing discharge summary and discharge planning  Discharge Instructions    Ambulatory referral to Neurology   Complete by: As directed    An appointment is requested in approximately 4 weeks left MCA infarction     Allergies as of 08/02/2020   No Known Allergies     Medication List    STOP taking these medications   acetaminophen 160 MG/5ML solution Commonly known as: TYLENOL   acetaminophen 325 MG tablet Commonly known as:  TYLENOL   acetaminophen 650 MG suppository Commonly known as: TYLENOL   aspirin 300 MG suppository   aspirin 325 MG EC tablet   diclofenac 75 MG EC tablet Commonly known as: VOLTAREN   insulin aspart 100 UNIT/ML injection Commonly known as: novoLOG   losartan 25 MG tablet Commonly known as: Cozaar   ondansetron 4 MG tablet Commonly known as: ZOFRAN     TAKE these medications   amantadine 100 MG capsule Commonly known as: SYMMETREL Take 2 capsules (200 mg total) by mouth daily.   apixaban 5 MG Tabs tablet Commonly known as: ELIQUIS Take 1 tablet (5 mg total) by mouth 2 (two) times daily.   colestipol 1 g tablet Commonly known as: COLESTID Take 1 tablet (1 g total) by mouth 2 (two) times daily.   dapagliflozin propanediol 10 MG Tabs tablet Commonly known as: FARXIGA Take 1 tablet (10 mg total) by mouth daily.   LORazepam 1 MG tablet Commonly known as: ATIVAN Take 1 tablet (1 mg total) by mouth daily as needed for anxiety. What changed:   when to take this  reasons to take this   rosuvastatin 40 MG tablet Commonly known as: CRESTOR Take 1 tablet (40 mg total) by mouth daily.   senna-docusate 8.6-50 MG tablet Commonly known as: Senokot-S Take 1 tablet by mouth 2 (two) times daily. What changed:   when to take this  reasons to take this       Follow-up  Information    Lovorn, Jinny Blossom, MD Follow up.   Specialty: Physical Medicine and Rehabilitation Why: Office to call for appointment Contact information: [address] [place] [address] [phone]               Signed: Cathlyn Parsons 08/02/2020, 3:28 PM

## 2020-08-01 NOTE — Progress Notes (Signed)
Occupational Therapy Session Note  Patient Details  Name: Edwin Wall MRN: [id number] Date of Birth: 1940-06-20  Today's Date: 08/01/2020 OT Individual Time: 0802-2336 OT Individual Time Calculation (min): 58 min    Short Term Goals: Week 2:  OT Short Term Goal 1 (Week 2): STGs = LTGs d/t ELOS at Supervision   Skilled Therapeutic Interventions/Progress Updates:    Pt greeted at time of session sitting up in recliner already completed ADL with AM OT, wife present who remained thorughout. Session focus on patient/family education and hands on training for wife [name] to provide close supervision/CGA for: functional mobility, ambulation in the hallway, walk in shower transfers, commode transfers. Pt/wife collaboration for best set up for home shower, posterior entry method, placement of grab bars (will provide hand out), and placement of shower seat/BSC at home. Pt performing all transfers and ambulation with CGA/close supervision. Note adjusted RW for home to match height here, pt using new RW throughout session. In gym, 1x15 each: ball chest + overhead press, torso twists with cues for form. Back in room set up with alarm on call bell in reach in recliner.   Therapy Documentation Precautions:  Precautions Precautions: Fall Precaution Comments: R side inattention Restrictions Weight Bearing Restrictions: No     Therapy/Group: Individual Therapy  [name] 08/01/2020, 7:22 AM

## 2020-08-01 NOTE — Progress Notes (Signed)
PROGRESS NOTE   Subjective/Complaints:  Pt reports did have BM yesterday- small liquid BM- also increased bowel meds, so should help with having BMs.   D/c date moved to 6/3- pt VERY excited about this.   Ate 50% of breakfast this AM   ROS: somewhat limited by aphasia   Objective:   No results found. Recent Labs    07/30/20 0500  WBC 10.3  HGB 15.5  HCT 48.6  PLT 300   Recent Labs    07/30/20 0500  NA 141  K 4.0  CL 110  CO2 24  GLUCOSE 137*  BUN 26*  CREATININE 1.64*  CALCIUM 9.1    Intake/Output Summary (Last 24 hours) at 08/01/2020 0810 Last data filed at 07/31/2020 1800 Gross per 24 hour  Intake 110 ml  Output --  Net 110 ml        Physical Exam: Vital Signs Blood pressure 126/85, pulse 78, temperature 97.8 F (36.6 C), resp. rate 17, weight 104.1 kg, SpO2 98 %.       General: awake, alert, appropriate, sitting up in bed; ate 50% of tray;  NAD HENT: conjugate gaze; oropharynx moist CV: regular rate- irregular; no JVD Pulmonary: CTA B/L; no W/R/R- good air movement GI: soft, NT, ND, (+)BS Psychiatric: appropriate, bright affect this AM Neurological: alert, aphasic- speaking at short phrase level- some word errors, corrects without cues Ext: no clubbing, cyanosis, or edema Neuro: MAS of 1 in R hip/knee- no change Skin: Warm and dry.  Intact. Musc: No edema in extremities.  No tenderness in extremities. Word finding deficits. Seem to be able to convey thoughts to me. Motor exam remains somewhat limited due to participation, however >/4/5 throughout RUE and RLE --stable  Assessment/Plan: 1. Functional deficits which require 3+ hours per day of interdisciplinary therapy in a comprehensive inpatient rehab setting.  Physiatrist is providing close team supervision and 24 hour management of active medical problems listed below.  Physiatrist and rehab team continue to assess barriers to  discharge/monitor patient progress toward functional and medical goals  Care Tool:  Bathing    Body parts bathed by patient: Right arm,Left arm,Chest,Abdomen,Front perineal area,Buttocks,Left upper leg,Right upper leg,Face,Right lower leg,Left lower leg     Body parts n/a: Left lower leg,Right lower leg   Bathing assist Assist Level: Contact Guard/Touching assist     Upper Body Dressing/Undressing Upper body dressing   What is the patient wearing?: Pull over shirt    Upper body assist Assist Level: Set up assist    Lower Body Dressing/Undressing Lower body dressing      What is the patient wearing?: Pants     Lower body assist Assist for lower body dressing: Contact Guard/Touching assist     Toileting Toileting    Toileting assist Assist for toileting: Maximal Assistance - Patient 25 - 49%     Transfers Chair/bed transfer  Transfers assist     Chair/bed transfer assist level: Supervision/Verbal cueing     Locomotion Ambulation   Ambulation assist      Assist level: Contact Guard/Touching assist Assistive device: Walker-rolling Max distance: 250   Walk 10 feet activity   Assist  Assist level: Contact Guard/Touching assist Assistive device: Walker-rolling   Walk 50 feet activity   Assist    Assist level: Contact Guard/Touching assist Assistive device: Walker-rolling    Walk 150 feet activity   Assist Walk 150 feet activity did not occur: Safety/medical concerns  Assist level: Contact Guard/Touching assist Assistive device: Walker-rolling    Walk 10 feet on uneven surface  activity   Assist Walk 10 feet on uneven surfaces activity did not occur: Safety/medical concerns         Wheelchair     Assist Will patient use wheelchair at discharge?: Yes (Per PT long term goals) Type of Wheelchair: Manual    Wheelchair assist level: Moderate Assistance - Patient 50 - 74% Max wheelchair distance: 150    Wheelchair 50 feet  with 2 turns activity    Assist        Assist Level: Moderate Assistance - Patient 50 - 74%   Wheelchair 150 feet activity     Assist      Assist Level: Moderate Assistance - Patient 50 - 74%   Blood pressure 126/85, pulse 78, temperature 97.8 F (36.6 C), resp. rate 17, weight 104.1 kg, SpO2 98 %.  Medical Problem List and Plan: 1.  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  CT scan showing expected evolution of left MCA infarct with?  Petechial hemorrhage.   6/1- con't PT, OT and SLP 2.  Antithrombotics: -DVT/anticoagulation: Awaiting plan transition to Eliquis             -antiplatelet therapy: Currently on aspirin 325 mg daily awaiting transition to Eliquis for stroke prevention  5/23- has petechial hemorrhage possibly-  Started on Eliquis Friday- will monitor 3. Pain Management: Tylenol as needed  5/31- no pain today- having intermittently- tylenol prn- con't regimen 4. Mood: Ativan 1 mg daily.  Provide emotional support             -antipsychotic agents: [name] 5. Neuropsych: This patient is? capable of making decisions on his own behalf. 6. Skin/Wound Care: Routine skin checks 7. Fluids/Electrolytes/Nutrition: Routine in and outs 8.  Hypertension  6/1- BP well controlled- con't regimen             Monitor increase activity 9.  Hyperlipidemia.  Crestor/Colestid 10.  New onset atrial fibrillation.  Cardiac rate controlled.  Await plan to begin Eliquis  5/23- on Eliquis- stable rhythm/irregular- con't regimen  6/1- in Afib- rate controlled- con't regimen             Monitor with increased activity 11.  Diabetes mellitus type 2 with hyperglycemia.  Hemoglobin A1c 6.6 2018 and 7.1 on this admission.  He was on no glucose  lowering agents at home.  Currently SSI.  Await plan to possibly begin metformin.  Started Farxiga 5mg  daily per pharmacy   Slightly labile on 5/22, would not make any further adjustments today  5/23- somewhat labile, but will try to increase [place] tomorrow to 10 mg daily.   5/24- increased to 10 mg daily- will monitor  5/25- BGs still an issue- will call DM coordinators- family says they wouldn't be able to do insulin easily.  5/26- spoke with DM coordinator- since pt's age as well as recent stroke, they feel  control is adequate and agree no insulin or changes to regimen             6/1- Better control- overall in 120s- con't regimen- might need Prior auth before d/c.  12.  History of colon cancer with colectomy 2018.  Follow-up outpatient 13.  Obesity.  BMI 36.24.  Dietary follow-up 14.  AKI/CKD3B.    Creatinine 1.68 on 5/19, labs ordered for tomorrow  5/26- Cr 1.58- doing slightly better- con't to monitor  5/30- Cr 1.64- basically stable- con't to monitor  Continue to monitor 15.  Mycoplasma pneumonia  - patient likely wax/wanes as previously noted to be extremely somnolent on acute floor.   CT head ordered, essentially showing expected evolution.  WBCs elevated at 11.5 on 5/22, however this appears to be within patient's range.  No fevers or signs/symptoms of infection.  UA/Ucx pending  Blood cultures ordered  5/23- U/A (-) except small Hb. No growth on blood Cx's so far- WBC up to 12.6- will also check CXR and looks better today- so will monitor and recheck in AM  5/24- WBC stable at 12.4- UCx (-); CXR shows some possible opacity diffuse/mild- atypical? Will call IM to help out.  Blood Cx's (-) so far.    5/25- blood Cx's still negative- spoke with IM and they agree could be atypical walking pneumonia wife mentioned that was coughing for 1 month prior to admission. Trying to get resp Cx- have asked if Respiratory can be called to get it for [place] since not coughing now. Labs in AM- will try  Zpack 500 mg x1 and then 4 days of 250 mg since getting Cx so hard and this is only things that makes sense, based on CXR.   5/26- pt appears more awake and WBC down to 10.8 from 12.4- so think we chose right to treat-  5/29-mycoplasma cx still active/pending. Sx appear improved  6/1- Sx's resolved- off ABX- con't to monitor 16. Constipation  5/23- will order sorbitol if hasn't ha BM- documented in computer 5 days ago?  5/24- no BM documented- will give another 60cc of Sorbitol- if no results, will check KUB/Mg citrate?  5/28--->2 large bm's  5/31- LBM 3 days ago- needing assistance to go/ meds- will give sorbitol if no BM by 3pm. Will make senokot 1 tab BID since having difficulty going.  6/1- 1 small BM yesterday- will con't senokot BID and prn sorbitol  17. Aphasia/lethargy  5/31- will increase amantadine to 200 mg daily.   6/1- brighter/more awake, more interactive- con't regimen   LOS: 14 days A FACE TO FACE EVALUATION WAS PERFORMED  Jia Mohamed 08/01/2020, 8:10 AM

## 2020-08-02 ENCOUNTER — Other Ambulatory Visit (HOSPITAL_COMMUNITY): Payer: Self-pay

## 2020-08-02 LAB — GLUCOSE, CAPILLARY
Glucose-Capillary: 129 mg/dL — ABNORMAL HIGH (ref 70–99)
Glucose-Capillary: 131 mg/dL — ABNORMAL HIGH (ref 70–99)
Glucose-Capillary: 185 mg/dL — ABNORMAL HIGH (ref 70–99)
Glucose-Capillary: 142 mg/dL — ABNORMAL HIGH (ref 70–99)

## 2020-08-02 MED ORDER — SORBITOL 70 % SOLN
60.0000 mL | Freq: Once | Status: AC
  Administered 2020-08-02: 60 mL via ORAL
  Filled 2020-08-02: qty 60

## 2020-08-02 MED ORDER — FLEET ENEMA 7-19 GM/118ML RE ENEM: 1.0000 | ENEMA | Freq: Once | RECTAL | Status: DC

## 2020-08-02 NOTE — Progress Notes (Signed)
Physical Therapy Session Note  Patient Details  Name: Edwin Wall MRN: [id number] Date of Birth: 1940/09/29  Today's Date: 08/02/2020 PT Individual Time: 1009-1100 PT Individual Time Calculation (min): 51 min   Short Term Goals: Week 1:  PT Short Term Goal 1 (Week 1): pt to demonstrate supine<>sit CGA PT Short Term Goal 2 (Week 1): pt to demonstrate functional transfers CGA PT Short Term Goal 3 (Week 1): pt to demonstrate gait 200' CGA with good awareness of Rt side  Skilled Therapeutic Interventions/Progress Updates:    pt received in recliner and agreeable to therapy. Pt directed in gait training with Rolling walker 300' +500' +150' supervision. Pt directed in stair training x12 6" with handrails supervision. Pt directed in car transfer supervision, ascending/descending Ramp supervision, picking up object from floor  From standing supervision, with light UE support. Pt and wife present for education and current recommendations for home, wife had multiple questions for home with need of supervision. Pt and wife educated that currently pt requires 24/7 supervision for home for safety as pt continues to demonstrate need of VC for safety awareness intermittently with Rt side, problem solving. Pt's wife agreed and then pt had question about mowing the grass at home, pt educated that this would be unsafe currently at home and explained reasoning with this. Pt agreed. Pt also currently agreeable to outpatient PT, Rolling walker for home and wife and pt agreeable. Pt returned to recliner supervision. Pt left there, wife present, All needs in reach and in good condition. Call light in hand.  And alarm set.   Therapy Documentation Precautions:  Precautions Precautions: Fall Precaution Comments: R side inattention Restrictions Weight Bearing Restrictions: No General:   Vital Signs: Therapy Vitals Temp: 98 F (36.7 C) Pulse Rate: 82 Resp: 18 BP: 118/76 Patient Position (if  appropriate): Lying Oxygen Therapy SpO2: 99 % O2 Device: Room Air Pain: Pain Assessment Pain Scale: 0-10 Pain Score: 0-No pain Mobility: Bed Mobility Bed Mobility: Rolling Right;Rolling Left;Supine to Sit;Sit to Supine;Scooting to [facility];Sitting - Scoot to [name] of Bed Rolling Right: Supervision/verbal cueing Rolling Left: Supervision/Verbal cueing Supine to Sit: Supervision/Verbal cueing Sitting - Scoot to Edge of Bed: Supervision/Verbal cueing Sit to Supine: Supervision/Verbal cueing Scooting to HOB: Supervision/Verbal Cueing Transfers Transfers: Sit to Stand;Stand to Sit;Stand Pivot Transfers Sit to Stand: Supervision/Verbal cueing Stand to Sit: Supervision/Verbal cueing Stand Pivot Transfers: Supervision/Verbal cueing Stand Pivot Transfer Details: Verbal cues for technique;Verbal cues for safe use of DME/AE;Verbal cues for precautions/safety;Verbal cues for sequencing Transfer (Assistive device): Rolling walker Locomotion : Gait Ambulation: Yes Gait Assistance: Supervision/Verbal cueing Gait Distance (Feet): 500 Feet Assistive device: Rolling walker Gait Assistance Details: Verbal cues for precautions/safety;Verbal cues for technique Gait Gait: Yes Gait Pattern: Impaired Gait Pattern: Step-through pattern Gait velocity: decreased Stairs / Additional Locomotion Stairs: Yes Stairs Assistance: Supervision/Verbal cueing Stair Management Technique: Two rails Number of Stairs: 14 Height of Stairs: 6 Ramp: Supervision/Verbal cueing Curb: Supervision/Verbal cueing Wheelchair Mobility Wheelchair Mobility: No  Trunk/Postural Assessment : Cervical Assessment Cervical Assessment: Within Functional Limits Thoracic Assessment Thoracic Assessment: Within Functional Limits Lumbar Assessment Lumbar Assessment: Exceptions to [facility] Postural Control Postural Control: Deficits on evaluation Postural Limitations: decreased but improved  Balance: Balance Balance Assessed:  Yes Standardized Balance Assessment Standardized Balance Assessment: Timed Up and Go Test Timed Up and Go Test TUG: Normal TUG Normal TUG (seconds): 19 Static Sitting Balance Static Sitting - Balance Support: Feet supported;No upper extremity supported Static Sitting - Level of Assistance: 6: Modified independent (Device/Increase time)  Dynamic Sitting Balance Dynamic Sitting - Balance Support: Feet supported;No upper extremity supported Dynamic Sitting - Level of Assistance: 5: Stand by assistance Dynamic Sitting - Balance Activities: Lateral lean/weight shifting;Forward lean/weight shifting;Reaching across midline;Reaching for objects Static Standing Balance Static Standing - Balance Support: During functional activity;No upper extremity supported Static Standing - Level of Assistance: 5: Stand by assistance Dynamic Standing Balance Dynamic Standing - Balance Support: During functional activity;No upper extremity supported Dynamic Standing - Level of Assistance: 5: Stand by assistance Dynamic Standing - Balance Activities: Lateral lean/weight shifting;Forward lean/weight shifting;Reaching for objects Dynamic Standing - Comments: 5xSTS: 17s with use of UE to ascend from seated position Exercises:   Other Treatments:      Therapy/Group: Individual Therapy  Junie Panning 08/02/2020, 2:31 PM

## 2020-08-02 NOTE — Progress Notes (Signed)
Physical Therapy Discharge Summary  Patient Details  Name: Edwin Wall MRN: [id number] Date of Birth: 1940-04-08  Today's Date: 08/02/2020 PT Individual Time: 1009-1100 PT Individual Time Calculation (min): 51 min    Patient has met 10 of 11 long term goals due to improved activity tolerance, improved balance, improved postural control, increased strength, increased range of motion and ability to compensate for deficits.  Patient to discharge at an ambulatory level Supervision.   Patient's care partner is independent to provide the necessary physical and cognitive assistance at discharge.  Reasons goals not met: WC mobility not made a priority for treatment and pt will no longer need one for home.   Recommendation:  Patient will benefit from ongoing skilled PT services in outpatient setting to continue to advance safe functional mobility, address ongoing impairments in balance, safety awareness, gait training, and minimize fall risk.  Equipment: BSC and RW  Reasons for discharge: discharge from hospital  Patient/family agrees with progress made and goals achieved: Yes  PT Discharge Precautions/Restrictions Precautions Precautions: Fall Precaution Comments: R side inattention Restrictions Weight Bearing Restrictions: No Vital Signs Therapy Vitals Temp: 98 F (36.7 C) Pulse Rate: 82 Resp: 18 BP: 118/76 Patient Position (if appropriate): Lying Oxygen Therapy SpO2: 99 % O2 Device: Room Air Pain Pain Assessment Pain Scale: 0-10 Pain Score: 0-No pain Vision/Perception  Vision - Assessment Eye Alignment: Within Functional Limits Ocular Range of Motion: Within Functional Limits Perception Perception: Impaired Praxis Praxis: Intact  Cognition Overall Cognitive Status: Impaired/Different from baseline Arousal/Alertness: Awake/alert Orientation Level: Oriented to person;Oriented to place;Disoriented to situation Attention: Sustained;Selective Sustained  Attention: Appears intact Selective Attention: Appears intact Memory: Impaired Problem Solving: Impaired Behaviors: Impulsive Safety/Judgment: Impaired Sensation Sensation Light Touch: Impaired by gross assessment Proprioception: Impaired by gross assessment Coordination Gross Motor Movements are Fluid and Coordinated: No Fine Motor Movements are Fluid and Coordinated: No Coordination and Movement Description: limited d/t mild CVA but improved from eval Motor  Motor Motor: Within Functional Limits  Mobility Bed Mobility Bed Mobility: Rolling Right;Rolling Left;Supine to Sit;Sit to Supine;Scooting to [facility];Sitting - Scoot to [name] of Bed Rolling Right: Supervision/verbal cueing Rolling Left: Supervision/Verbal cueing Supine to Sit: Supervision/Verbal cueing Sitting - Scoot to Edge of Bed: Supervision/Verbal cueing Sit to Supine: Supervision/Verbal cueing Scooting to HOB: Supervision/Verbal Cueing Transfers Transfers: Sit to Stand;Stand to Sit;Stand Pivot Transfers Sit to Stand: Supervision/Verbal cueing Stand to Sit: Supervision/Verbal cueing Stand Pivot Transfers: Supervision/Verbal cueing Stand Pivot Transfer Details: Verbal cues for technique;Verbal cues for safe use of DME/AE;Verbal cues for precautions/safety;Verbal cues for sequencing Transfer (Assistive device): Rolling walker Locomotion  Gait Ambulation: Yes Gait Assistance: Supervision/Verbal cueing Gait Distance (Feet): 500 Feet Assistive device: Rolling walker Gait Assistance Details: Verbal cues for precautions/safety;Verbal cues for technique Gait Gait: Yes Gait Pattern: Impaired Gait Pattern: Step-through pattern Gait velocity: decreased Stairs / Additional Locomotion Stairs: Yes Stairs Assistance: Supervision/Verbal cueing Stair Management Technique: Two rails Number of Stairs: 14 Height of Stairs: 6 Ramp: Supervision/Verbal cueing Curb: Supervision/Verbal cueing Wheelchair Mobility Wheelchair  Mobility: No  Trunk/Postural Assessment  Cervical Assessment Cervical Assessment: Within Functional Limits Thoracic Assessment Thoracic Assessment: Within Functional Limits Lumbar Assessment Lumbar Assessment: Exceptions to [facility] Postural Control Postural Control: Deficits on evaluation Postural Limitations: decreased but improved  Balance Balance Balance Assessed: Yes Standardized Balance Assessment Standardized Balance Assessment: Timed Up and Go Test Timed Up and Go Test TUG: Normal TUG Normal TUG (seconds): 19 Static Sitting Balance Static Sitting - Balance Support: Feet supported;No upper extremity supported Static Sitting -  Level of Assistance: 6: Modified independent (Device/Increase time) Dynamic Sitting Balance Dynamic Sitting - Balance Support: Feet supported;No upper extremity supported Dynamic Sitting - Level of Assistance: 5: Stand by assistance Dynamic Sitting - Balance Activities: Lateral lean/weight shifting;Forward lean/weight shifting;Reaching across midline;Reaching for objects Static Standing Balance Static Standing - Balance Support: During functional activity;No upper extremity supported Static Standing - Level of Assistance: 5: Stand by assistance Dynamic Standing Balance Dynamic Standing - Balance Support: During functional activity;No upper extremity supported Dynamic Standing - Level of Assistance: 5: Stand by assistance Dynamic Standing - Balance Activities: Lateral lean/weight shifting;Forward lean/weight shifting;Reaching for objects Dynamic Standing - Comments: 5xSTS: 17s with use of UE to ascend from seated position Extremity Assessment  RUE Assessment RUE Assessment: Within Functional Limits General Strength Comments: WFL for ADLs, roughly 4+/5 LUE Assessment LUE Assessment: Within Functional Limits RLE Assessment RLE Assessment: Exceptions to [facility] RLE Strength Right Hip Flexion: 3+/5 Right Hip ABduction: 4-/5 Right Hip ADduction: 4/5 Right  Knee Extension: 4/5 Right Ankle Dorsiflexion: 5/5 Right Ankle Plantar Flexion: 5/5 LLE Assessment LLE Assessment: Exceptions to [facility] LLE Strength Left Hip Flexion: 4/5 Left Hip ABduction: 4+/5 Left Hip ADduction: 4+/5 Left Knee Extension: 4+/5 Left Ankle Dorsiflexion: 5/5 Left Ankle Plantar Flexion: 5/5    Junie Panning 08/02/2020, 2:37 PM

## 2020-08-02 NOTE — Progress Notes (Signed)
Inpatient Rehabilitation Care Coordinator Discharge Note  The overall goal for the admission was met for:   Discharge location: Yes-HOME WITH WIFE AND ADULT SON  Length of Stay: Yes-16 DAYS  Discharge activity level: Yes-SUPERVISION LEVEL  Home/community participation: Yes  Services provided included: MD, RD, PT, OT, SLP, RN, CM, Pharmacy, Neuropsych and SW  Financial Services: Medicare and Private Insurance: [place] offered to/list presented to:PT AND WIFE  Follow-up services arranged: Outpatient: [facility]PT,OT,SP WILL CONTACT WIFE TO ARRANGE APPOINTMENTS, DME: [facility] & 3 IN 1 and Patient/Family has no preference for HH/DME agencies  Comments (or additional information):WIFE HAS BEEN HERE DAILY AND PARTICIPATED IN THERAPIES WITH HUSBAND, FEELS COMFORTABLE WITH CARE NEEDS  Patient/Family verbalized understanding of follow-up arrangements: Yes  Individual responsible for coordination of the follow-up plan: [name]-WIFE [phone]  Confirmed correct DME delivered: [name] 08/02/2020    [name]

## 2020-08-02 NOTE — Progress Notes (Signed)
Physical Therapy Session Note  Patient Details  Name: Edwin Wall MRN: [id number] Date of Birth: 1940-06-22  Today's Date: 08/02/2020 PT Individual Time: 1540-1620 PT Individual Time Calculation (min): 40 min   Short Term Goals: Week 1:  PT Short Term Goal 1 (Week 1): pt to demonstrate supine<>sit CGA PT Short Term Goal 2 (Week 1): pt to demonstrate functional transfers CGA PT Short Term Goal 3 (Week 1): pt to demonstrate gait 200' CGA with good awareness of Rt side  Skilled Therapeutic Interventions/Progress Updates:    Pt received supine in bed and with min encouragement agreeable to therapy session because pt thought he was finished with therapy for the day. Pt reports feeling urge to use bathroom. Supine>sitting R EOB, HOB slightly elevated and using bedrail, with supervision. Sit<>stands using RW with supervision during session. Gait training ~35ft 2x in/out of bathroom using RW with supervision - cuing for safe AD management as pt bumping it into doorway on his R and then not turning it fully when turning to sit on BSC over toilet. Provided min assist for LB clothing management due to urgency; however, pt found to have been incontinent of bladder in brief. Standing with supervision using RW for support performed anterior/posterior peri-care with set-up assist. Once donned clean brief and pulled up pants pt suddenly reports need to use bathroom again - quickly tried to assist with LB clothing management and provide urinal but pt had already voided bladder - cleaned up again as noted above. Pt returned to sitting EOB and then suddenly reports feeling urge to have BM. Gait training using RW back into bathroom as above and assist for LB clothing management again due to urgency and pt had started having BM prior to reaching toilet. Pt further continently voided BM and noted to have diarrhea Caryl Pina, RN notified. Performed peri-care as noted above. At end of session pt returned to supine with  supervision and left supine in bed with needs in reach and bed alarm on.  Therapy Documentation Precautions:  Precautions Precautions: Fall Precaution Comments: R side inattention Restrictions Weight Bearing Restrictions: No  Pain: No reports of pain throughout session.   Therapy/Group: Individual Therapy  Tawana Scale , PT, DPT, CSRS  08/02/2020, 3:11 PM

## 2020-08-02 NOTE — Progress Notes (Signed)
PROGRESS NOTE   Subjective/Complaints:  Pt reports still no BM- only had small BM 2 days ago. And nothing since then- feels a little constipated.  Ate 50% of breakfast again this AM.  Still ready for d/c tomorrow.    ROS: somewhat limited by aphasia   Objective:   No results found. No results for input(s): WBC, HGB, HCT, PLT in the last 72 hours. No results for input(s): NA, K, CL, CO2, GLUCOSE, BUN, CREATININE, CALCIUM in the last 72 hours.  Intake/Output Summary (Last 24 hours) at 08/02/2020 0814 Last data filed at 08/02/2020 0731 Gross per 24 hour  Intake 670 ml  Output 200 ml  Net 470 ml        Physical Exam: Vital Signs Blood pressure 114/81, pulse 73, temperature 98 F (36.7 C), resp. rate 17, weight 104.1 kg, SpO2 97 %.        General: awake, alert, appropriate, sitting up in bed; NAD HENT: conjugate gaze; oropharynx moist CV: regular rate- irregular; no JVD Pulmonary: CTA B/L; no W/R/R- good air movement GI: soft, NT, ND, (+)BS Psychiatric: appropriate- smiling Neurological: alert- aphasic, but improving.  Ext: no clubbing, cyanosis, or edema Neuro: MAS of 1 in R hip/knee- no significant change Skin: Warm and dry.  Intact. Musc: No edema in extremities.  No tenderness in extremities. Word finding deficits. Seem to be able to convey thoughts to me. Motor exam remains somewhat limited due to participation, however >/4/5 throughout RUE and RLE --stable  Assessment/Plan: 1. Functional deficits which require 3+ hours per day of interdisciplinary therapy in a comprehensive inpatient rehab setting.  Physiatrist is providing close team supervision and 24 hour management of active medical problems listed below.  Physiatrist and rehab team continue to assess barriers to discharge/monitor patient progress toward functional and medical goals  Care Tool:  Bathing    Body parts bathed by patient: Right  arm,Left arm,Chest,Abdomen,Front perineal area,Buttocks,Left upper leg,Right upper leg,Face,Right lower leg,Left lower leg     Body parts n/a: Left lower leg,Right lower leg   Bathing assist Assist Level: Supervision/Verbal cueing (EOB)     Upper Body Dressing/Undressing Upper body dressing   What is the patient wearing?: Pull over shirt    Upper body assist Assist Level: Set up assist    Lower Body Dressing/Undressing Lower body dressing      What is the patient wearing?: Pants     Lower body assist Assist for lower body dressing: Contact Guard/Touching assist     Toileting Toileting    Toileting assist Assist for toileting: Maximal Assistance - Patient 25 - 49%     Transfers Chair/bed transfer  Transfers assist     Chair/bed transfer assist level: Contact Guard/Touching assist     Locomotion Ambulation   Ambulation assist      Assist level: Contact Guard/Touching assist Assistive device: Walker-rolling Max distance: 300'   Walk 10 feet activity   Assist     Assist level: Contact Guard/Touching assist Assistive device: Walker-rolling   Walk 50 feet activity   Assist    Assist level: Contact Guard/Touching assist Assistive device: Walker-rolling    Walk 150 feet activity   Assist Walk 150  feet activity did not occur: Safety/medical concerns  Assist level: Contact Guard/Touching assist Assistive device: Walker-rolling    Walk 10 feet on uneven surface  activity   Assist Walk 10 feet on uneven surfaces activity did not occur: Safety/medical concerns         Wheelchair     Assist Will patient use wheelchair at discharge?: Yes (Per PT long term goals) Type of Wheelchair: Manual    Wheelchair assist level: Moderate Assistance - Patient 50 - 74% Max wheelchair distance: 150    Wheelchair 50 feet with 2 turns activity    Assist        Assist Level: Moderate Assistance - Patient 50 - 74%   Wheelchair 150 feet  activity     Assist      Assist Level: Moderate Assistance - Patient 50 - 74%   Blood pressure 114/81, pulse 73, temperature 98 F (36.7 C), resp. rate 17, weight 104.1 kg, SpO2 97 %.  Medical Problem List and Plan: 1.  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  CT scan showing expected evolution of left MCA infarct with?  Petechial hemorrhage.   6/2- con't PT, OT and SLP- d/c tomorrow after family training.  2.  Antithrombotics: -DVT/anticoagulation: Awaiting plan transition to Eliquis             -antiplatelet therapy: Currently on aspirin 325 mg daily awaiting transition to Eliquis for stroke prevention  5/23- has petechial hemorrhage possibly-  Started on Eliquis Friday- will monitor 3. Pain Management: Tylenol as needed  5/31- no pain today- having intermittently- tylenol prn- con't regimen 4. Mood: Ativan 1 mg daily.  Provide emotional support             -antipsychotic agents: [name] 5. Neuropsych: This patient is? capable of making decisions on his own behalf. 6. Skin/Wound Care: Routine skin checks 7. Fluids/Electrolytes/Nutrition: Routine in and outs 8.  Hypertension  6/2- BP well controlled- con't regimen             Monitor increase activity 9.  Hyperlipidemia.  Crestor/Colestid 10.  New onset atrial fibrillation.  Cardiac rate controlled.  Await plan to begin Eliquis  5/23- on Eliquis- stable rhythm/irregular- con't regimen  6/1- in Afib- rate controlled- con't regimen             Monitor with increased activity 11.  Diabetes mellitus type 2 with hyperglycemia.  Hemoglobin A1c 6.6 2018 and 7.1 on this admission.  He was on no glucose lowering agents at home.  Currently SSI.  Await plan to possibly begin  metformin.  Started Farxiga 5mg  daily per pharmacy   Slightly labile on 5/22, would not make any further adjustments today  5/23- somewhat labile, but will try to increase [place] tomorrow to 10 mg daily.   5/24- increased to 10 mg daily- will monitor  5/25- BGs still an issue- will call DM coordinators- family says they wouldn't be able to do insulin easily.  5/26- spoke with DM coordinator- since pt's age as well as recent stroke, they feel control is adequate and agree no insulin or changes to regimen             6/1- Better control- overall in 120s- con't regimen- might need Prior  auth before d/c.  6/2- per Pharmacy, has a copay ~$40, but doesn't require prior auth  12.  History of colon cancer with colectomy 2018.  Follow-up outpatient 13.  Obesity.  BMI 36.24.  Dietary follow-up 14.  AKI/CKD3B.    Creatinine 1.68 on 5/19, labs ordered for tomorrow  5/26- Cr 1.58- doing slightly better- con't to monitor  5/30- Cr 1.64- basically stable- con't to monitor  Continue to monitor 15.  Mycoplasma pneumonia  - patient likely wax/wanes as previously noted to be extremely somnolent on acute floor.   CT head ordered, essentially showing expected evolution.    5/23- U/A (-) except small Hb. No growth on blood Cx's so far- WBC up to 12.6- will also check CXR and looks better today- so will monitor and recheck in AM  5/24- WBC stable at 12.4- UCx (-); CXR shows some possible opacity diffuse/mild- atypical? Will call IM to help out.  Blood Cx's (-) so far.    5/25- blood Cx's still negative- spoke with IM and they agree could be atypical walking pneumonia wife mentioned that was coughing for 1 month prior to admission. Trying to get resp Cx- have asked if Respiratory can be called to get it for [place] since not coughing now. Labs in AM- will try Zpack 500 mg x1 and then 4 days of 250 mg since getting Cx so hard and this is only things that makes sense, based on CXR.   5/26- pt appears more awake and WBC down  to 10.8 from 12.4- so think we chose right to treat-  5/29-mycoplasma cx still active/pending. Sx appear improved  6/1- Sx's resolved- off ABX- con't to monitor 16. Constipation  5/28--->2 large bm's  5/31- LBM 3 days ago- needing assistance to go/ meds- will give sorbitol if no BM by 3pm. Will make senokot 1 tab BID since having difficulty going.  6/1- 1 small BM yesterday- will con't senokot BID and prn sorbitol   6/2- will give another dose of Sorbitol 60cc and if no BM, will need enema.  17. Aphasia/lethargy  5/31- will increase amantadine to 200 mg daily.   6/1- brighter/more awake, more interactive- con't regimen  6/2- will send home on Amantadine and will assess if can stop it when follows up in clinic   LOS: 15 days A FACE TO FACE EVALUATION WAS PERFORMED  Edwin Wall 08/02/2020, 8:14 AM

## 2020-08-02 NOTE — Progress Notes (Signed)
Speech Language Pathology Discharge Summary  Patient Details  Name: Edwin Wall MRN: [id number] Date of Birth: [birth year]  Today's Date: 08/02/2020 SLP Individual Time: 3888-7579 SLP Individual Time Calculation (min): 50 min   Skilled Therapeutic Interventions:  Patient seen with wife present in room for skilled ST visit focusing on expressive and receptive language as well as patient and wife education. Patient answered yes/no questions after reading sentence-level text and was 75% accurate without cues. He completed cloze phrases by verbalizing and/or writing out responses at word level and was approximately 65% accurate with only supervision cues, improving to 80% with min-modA cues. SLP discussed patient's overall progress, continued difficulties (emphasis on his continued poor awareness of language errors) and recommendations for language tasks that could be one at home. (emphasized keeping things engaging and enjoyable).  All questions from wife answered to her satisfaction.    Patient has met 4 of 5 long term goals.  Patient to discharge at overall Supervision;Min level.  Reasons goals not met: Patient is progressing towards goals of awareness to deficits and ability to verbally communicate wants/needs however he is requiring mpre than supervision level for awareness.   Clinical Impression/Discharge Summary: Patient made good progress, meeting 4/5 LTG's in areas of cognition and language. He is demonstrating slow progress with awareness to language errors, however is demonstrating improved attention and self-monitoring during structured tasks. He is able to communicate basic needs with minA but requires significantly more cues for more complex level language as well as conversational level expression. Comprehension is higher than expression but he does continue to have difficulty demonstrating understanding of longer sentences. Patient will benefit from skilled SLP intevention at  [facility] clinic after discharge from [place].  Care Partner:  Caregiver Able to Provide Assistance: Yes  Type of Caregiver Assistance: Cognitive  Recommendation:  Outpatient SLP  Rationale for SLP Follow Up: Maximize functional communication   Equipment: [name] for speech   Reasons for discharge: Discharged from hospital   Sonia Baller, [place], [place] [facility]

## 2020-08-02 NOTE — Progress Notes (Signed)
Occupational Therapy Session Note  Patient Details  Name: Edwin Wall MRN: [id number] Date of Birth: 06-22-1940  Today's Date: 08/02/2020 OT Individual Time: 0915-1000 OT Individual Time Calculation (min): 45 min    Short Term Goals: Week 1:  OT Short Term Goal 1 (Week 1): STGs = LTGs OT Short Term Goal 1 - Progress (Week 1): Progressing toward goal  Skilled Therapeutic Interventions/Progress Updates:     Pt received in bed with no pain  ADL:  Pt completes bathing with supervision with VC for ant weight shift for sit to stand and min LOB posteriorly with no A to recover Pt completes UB dressing with S at a standing level for wrap around button up shirt. Pt completes LB dressing with S at sit to stand level Pt completes footwear with S for sock/shoes Pt completes toileting with S at sit to stand Pt completes toileting transfer with S overall and no LOB. VC for keeping RW on griund during turns Pt completes shower/Tub transfer with S for amb transfer with RW    Pt left at end of session in bed with exit alarm on, call light in reach and all needs met   Therapy Documentation Precautions:  Precautions Precautions: Fall Precaution Comments: R side inattention Restrictions Weight Bearing Restrictions: No General:   Vital Signs: Therapy Vitals Temp: 98 F (36.7 C) Pulse Rate: 73 Resp: 17 BP: 114/81 Patient Position (if appropriate): Lying Oxygen Therapy SpO2: 97 % Pain:   ADL: ADL Eating: Set up Grooming: Setup Upper Body Bathing: Setup Where Assessed-Upper Body Bathing: Shower Lower Body Bathing: Contact guard Where Assessed-Lower Body Bathing: Shower Upper Body Dressing: Supervision/safety Where Assessed-Upper Body Dressing: Edge of bed Lower Body Dressing: Contact guard Where Assessed-Lower Body Dressing: Edge of bed Toileting: Contact guard Where Assessed-Toileting: Glass blower/designer: Therapist, music Method:  Counselling psychologist: Energy manager: Curator Method: Heritage manager: Regulatory affairs officer    Praxis   Exercises:   Other Treatments:     Therapy/Group: Individual Therapy  Tonny Branch 08/02/2020, 7:58 AM

## 2020-08-02 NOTE — Progress Notes (Signed)
Occupational Therapy Discharge Summary  Patient Details  Name: Edwin Wall MRN: [id number] Date of Birth: 10/15/1940   Patient has met 7 of 7 long term goals due to improved activity tolerance, improved balance, postural control, ability to compensate for deficits, functional use of  RIGHT upper extremity, improved attention, improved awareness and improved coordination.  Patient to discharge at overall Supervision level.  Patient's care partner is independent to provide the necessary physical and cognitive assistance at discharge.  Pt is Supervision with all ADLs with RW for functional mobility. Wife [name] has completed family ed/training on 6/1 and can provide supervision for safety. Note pt had a medically complicated stay with unclear infection with minimal symptoms resulting in a set back and longer than expected LOS, but pt has recovered and is at Supervision level.   Reasons goals not met: NA   Recommendation:  Patient will benefit from ongoing skilled OT services in outpatient setting to continue to advance functional skills in the area of BADL, iADL and Reduce care partner burden.  Equipment: BSC  Reasons for discharge: treatment goals met and discharge from hospital  Patient/family agrees with progress made and goals achieved: Yes  OT Discharge Precautions/Restrictions  Precautions Precautions: Fall Precaution Comments: R side inattention Restrictions Weight Bearing Restrictions: No Vital Signs Therapy Vitals Temp: 98 F (36.7 C) Pulse Rate: 82 Resp: 18 BP: 118/76 Patient Position (if appropriate): Lying Oxygen Therapy SpO2: 99 % O2 Device: Room Air Pain Pain Assessment Pain Scale: 0-10 Pain Score: 0-No pain ADL ADL Eating: Set up Grooming: Setup Upper Body Bathing: Setup Where Assessed-Upper Body Bathing: Shower Lower Body Bathing: Supervision/safety Where Assessed-Lower Body Bathing: Shower Upper Body Dressing: Setup Where Assessed-Upper Body  Dressing: Edge of bed Lower Body Dressing: Supervision/safety Where Assessed-Lower Body Dressing: Edge of bed Toileting: Supervision/safety Where Assessed-Toileting: Glass blower/designer: Close supervision Armed forces technical officer Method: Counselling psychologist: Energy manager: Close supervision Social research officer, government Method: Heritage manager: Arts development officer Vision Baseline Vision/History: Wears glasses Wears Glasses: At all times Perception  Perception: Impaired Praxis Praxis: Intact Cognition Overall Cognitive Status: Impaired/Different from baseline Arousal/Alertness: Awake/alert Memory: Impaired Problem Solving: Impaired Behaviors: Impulsive Safety/Judgment: Impaired Sensation Sensation Light Touch: Impaired by gross assessment Proprioception: Impaired by gross assessment Coordination Gross Motor Movements are Fluid and Coordinated: No Fine Motor Movements are Fluid and Coordinated: No Coordination and Movement Description: limited d/t mild CVA but improved from eval Motor  Motor Motor: Within Functional Limits Mobility  Bed Mobility Bed Mobility: Rolling Right;Rolling Left;Supine to Sit;Sit to Supine;Scooting to [facility];Sitting - Scoot to [name] of Bed Rolling Right: Supervision/verbal cueing Rolling Left: Supervision/Verbal cueing Supine to Sit: Supervision/Verbal cueing Sitting - Scoot to Edge of Bed: Supervision/Verbal cueing Sit to Supine: Supervision/Verbal cueing Scooting to HOB: Supervision/Verbal Cueing Transfers Sit to Stand: Supervision/Verbal cueing Stand to Sit: Supervision/Verbal cueing  Trunk/Postural Assessment  Cervical Assessment Cervical Assessment: Within Functional Limits Thoracic Assessment Thoracic Assessment: Within Functional Limits Lumbar Assessment Lumbar Assessment: Exceptions to [facility] Postural Control Postural Control: Deficits on evaluation Postural Limitations: decreased but  improved  Balance Balance Balance Assessed: Yes Static Sitting Balance Static Sitting - Balance Support: Feet supported;No upper extremity supported Static Sitting - Level of Assistance: 6: Modified independent (Device/Increase time) Dynamic Sitting Balance Dynamic Sitting - Balance Support: Feet supported;No upper extremity supported Dynamic Sitting - Level of Assistance: 5: Stand by assistance Dynamic Sitting - Balance Activities: Lateral lean/weight shifting;Forward lean/weight shifting;Reaching across midline;Reaching for objects Theatre stage manager  Standing - Balance Support: During functional activity;No upper extremity supported Static Standing - Level of Assistance: 5: Stand by assistance Dynamic Standing Balance Dynamic Standing - Balance Support: During functional activity;No upper extremity supported Dynamic Standing - Level of Assistance: 5: Stand by assistance Dynamic Standing - Balance Activities: Lateral lean/weight shifting;Forward lean/weight shifting;Reaching for objects Extremity/Trunk Assessment RUE Assessment RUE Assessment: Within Functional Limits General Strength Comments: WFL for ADLs, roughly 4+/5 LUE Assessment LUE Assessment: Within Functional Limits   Edwin Wall 08/02/2020, 2:13 PM

## 2020-08-03 LAB — GLUCOSE, CAPILLARY: Glucose-Capillary: 122 mg/dL — ABNORMAL HIGH (ref 70–99)

## 2020-08-03 NOTE — Progress Notes (Signed)
INPATIENT REHABILITATION DISCHARGE NOTE   Discharge instructions by: Jeannene Patella, PA  Verbalized understanding: Yes  Skin care/Wound care: none  Pain: none  IV's: none  Tubes/Drains: none  Safety instructions: done  Patient belongings: sent with pt and family  Discharged to: home  Discharged via: family transport   Notes: done

## 2020-08-03 NOTE — Progress Notes (Addendum)
PROGRESS NOTE   Subjective/Complaints:  BM on 08/22/20- feels better! Ready for d/c today.    ROS: somewhat limited by aphasia   Objective:   No results found. No results for input(s): WBC, HGB, HCT, PLT in the last 72 hours. No results for input(s): NA, K, CL, CO2, GLUCOSE, BUN, CREATININE, CALCIUM in the last 72 hours.  Intake/Output Summary (Last 24 hours) at 08/03/2020 0803 Last data filed at 08/03/2020 0719 Gross per 24 hour  Intake 537 ml  Output --  Net 537 ml        Physical Exam: Vital Signs Blood pressure 123/82, pulse 62, temperature 98 F (36.7 C), temperature source Oral, resp. rate 18, weight 104.1 kg, SpO2 98 %.         General: awake, alert, appropriate, sitting up in bed; finished breakfast; NAD HENT: conjugate gaze; oropharynx moist CV: regular rate- irregular; no JVD Pulmonary: CTA B/L; no W/R/R- good air movement GI: soft, NT, ND, (+)BS Psychiatric: appropriate- more interactive Neurological: alert; aphasia improving slightly.  Ext: no clubbing, cyanosis, or edema Neuro: MAS of 1 in R hip/knee- no significant change Skin: Warm and dry.  Intact. Musc: No edema in extremities.  No tenderness in extremities. Word finding deficits. Seem to be able to convey thoughts to me. Motor exam remains somewhat limited due to participation, however >/4/5 throughout RUE and RLE --stable  Assessment/Plan: 1. Functional deficits which require 3+ hours per day of interdisciplinary therapy in a comprehensive inpatient rehab setting.  Physiatrist is providing close team supervision and 24 hour management of active medical problems listed below.  Physiatrist and rehab team continue to assess barriers to discharge/monitor patient progress toward functional and medical goals  Care Tool:  Bathing    Body parts bathed by patient: Right arm,Left arm,Chest,Abdomen,Front perineal area,Buttocks,Left upper  leg,Right upper leg,Face,Right lower leg,Left lower leg     Body parts n/a: Left lower leg,Right lower leg   Bathing assist Assist Level: Supervision/Verbal cueing     Upper Body Dressing/Undressing Upper body dressing   What is the patient wearing?: Pull over shirt    Upper body assist Assist Level: Set up assist    Lower Body Dressing/Undressing Lower body dressing      What is the patient wearing?: Pants     Lower body assist Assist for lower body dressing: Supervision/Verbal cueing     Toileting Toileting    Toileting assist Assist for toileting: Supervision/Verbal cueing     Transfers Chair/bed transfer  Transfers assist     Chair/bed transfer assist level: Supervision/Verbal cueing     Locomotion Ambulation   Ambulation assist      Assist level: Supervision/Verbal cueing Assistive device: Walker-rolling Max distance: 500   Walk 10 feet activity   Assist     Assist level: Supervision/Verbal cueing Assistive device: Walker-rolling   Walk 50 feet activity   Assist    Assist level: Supervision/Verbal cueing Assistive device: Walker-rolling    Walk 150 feet activity   Assist Walk 150 feet activity did not occur: Safety/medical concerns  Assist level: Supervision/Verbal cueing Assistive device: Walker-rolling    Walk 10 feet on uneven surface  activity   Assist Walk  10 feet on uneven surfaces activity did not occur: Safety/medical concerns   Assist level: Contact Guard/Touching assist Assistive device: Aeronautical engineer Will patient use wheelchair at discharge?: No Type of Wheelchair: Manual    Wheelchair assist level: Moderate Assistance - Patient 50 - 74% Max wheelchair distance: 150    Wheelchair 50 feet with 2 turns activity    Assist        Assist Level: Moderate Assistance - Patient 50 - 74%   Wheelchair 150 feet activity     Assist      Assist Level: Moderate Assistance  - Patient 50 - 74%   Blood pressure 123/82, pulse 62, temperature 98 F (36.7 C), temperature source Oral, resp. rate 18, weight 104.1 kg, SpO2 98 %.  Medical Problem List and Plan: 1.  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  CT scan showing expected evolution of left MCA infarct with?  Petechial hemorrhage.   6/3- d/c today - will need f/u with Neurology, PCP and myself- hospital f/u.  2.  Antithrombotics: -DVT/anticoagulation: Awaiting plan transition to Eliquis             -antiplatelet therapy: Currently on aspirin 325 mg daily awaiting transition to Eliquis for stroke prevention  5/23- has petechial hemorrhage possibly-  Started on Eliquis Friday- will monitor 3. Pain Management: Tylenol as needed  5/31- no pain today- having intermittently- tylenol prn- con't regimen 4. Mood: Ativan 1 mg daily.  Provide emotional support             -antipsychotic agents: [name] 5. Neuropsych: This patient is? capable of making decisions on his own behalf. 6. Skin/Wound Care: Routine skin checks 7. Fluids/Electrolytes/Nutrition: Routine in and outs 8.  Hypertension  6/3- BP controlled- con't regimen             Monitor increase activity 9.  Hyperlipidemia.  Crestor/Colestid 10.  New onset atrial fibrillation.  Cardiac rate controlled.  Await plan to begin Eliquis  5/23- on Eliquis- stable rhythm/irregular- con't regimen  6/1- in Afib- rate controlled- con't regimen             Monitor with increased activity 11.  Diabetes mellitus type 2 with hyperglycemia.  Hemoglobin A1c 6.6 2018 and 7.1 on this admission.  He was on no glucose lowering agents at home.  Currently SSI.  Await plan to possibly begin metformin.  Started Farxiga 5mg  daily per pharmacy    Slightly labile on 5/22, would not make any further adjustments today  5/23- somewhat labile, but will try to increase [place] tomorrow to 10 mg daily.   5/24- increased to 10 mg daily- will monitor  5/25- BGs still an issue- will call DM coordinators- family says they wouldn't be able to do insulin easily.  5/26- spoke with DM coordinator- since pt's age as well as recent stroke, they feel control is adequate and agree no insulin or changes to regimen             6/1- Better control- overall in 120s- con't regimen- might need Prior auth before d/c.  6/2- per Pharmacy, has a copay ~$40, but doesn't require prior auth   6/3- send home on above meds/Farxiga 12.  History  of colon cancer with colectomy 2018.  Follow-up outpatient 13.  Obesity.  BMI 36.24.  Dietary follow-up 14.  AKI/CKD3B.    Creatinine 1.68 on 5/19, labs ordered for tomorrow  5/26- Cr 1.58- doing slightly better- con't to monitor  5/30- Cr 1.64- basically stable- con't to monitor  Continue to monitor 15.  Mycoplasma pneumonia  5/23- U/A (-) except small Hb. No growth on blood Cx's so far- WBC up to 12.6- will also check CXR and looks better today- so will monitor and recheck in AM  5/24- WBC stable at 12.4- UCx (-); CXR shows some possible opacity diffuse/mild- atypical? Will call IM to help out.  Blood Cx's (-) so far.    5/25- blood Cx's still negative- spoke with IM and they agree could be atypical walking pneumonia wife mentioned that was coughing for 1 month prior to admission. Trying to get resp Cx- have asked if Respiratory can be called to get it for [place] since not coughing now. Labs in AM- will try Zpack 500 mg x1 and then 4 days of 250 mg since getting Cx so hard and this is only things that makes sense, based on CXR.   5/26- pt appears more awake and WBC down to 10.8 from 12.4- so think we chose right to treat-  5/29-mycoplasma cx still active/pending. Sx appear improved  6/3- resolved 16. Constipation  5/28--->2  large bm's  5/31- LBM 3 days ago- needing assistance to go/ meds- will give sorbitol if no BM by 3pm. Will make senokot 1 tab BID since having difficulty going.  6/1- 1 small BM yesterday- will con't senokot BID and prn sorbitol   6/2- will give another dose of Sorbitol 60cc and if no BM, will need enema.  6/3- had good BM yesterday- suggest pt take OTC bowel meds after d/c- will need for the foreseeable future Can add Miralax daily.   17. Aphasia/lethargy  5/31- will increase amantadine to 200 mg daily.   6/1- brighter/more awake, more interactive- con't regimen  6/2- will send home on Amantadine and will assess if can stop it when follows up in clinic   LOS: 16 days A FACE TO FACE EVALUATION WAS PERFORMED  Edwin Wall 08/03/2020, 8:03 AM

## 2020-08-09 LAB — MYCOPLASMA PNEUMONIAE CULTURE

## 2020-08-10 ENCOUNTER — Telehealth (HOSPITAL_COMMUNITY): Payer: Self-pay | Admitting: Pharmacist

## 2020-08-10 NOTE — Telephone Encounter (Signed)
Attempted to call patient.  No answer and vm not set up.  Will try to call at a later date.

## 2020-08-13 ENCOUNTER — Telehealth (HOSPITAL_COMMUNITY): Payer: Self-pay

## 2020-08-13 ENCOUNTER — Other Ambulatory Visit (HOSPITAL_COMMUNITY): Payer: Self-pay

## 2020-08-13 NOTE — Telephone Encounter (Signed)
Transitions of Care Pharmacy   Call attempted for a pharmacy transitions of care follow-up. Wrong phone number. Call attempt #2. Need new Phone number to attempt next call

## 2020-08-14 ENCOUNTER — Telehealth (HOSPITAL_COMMUNITY): Payer: Self-pay

## 2020-08-14 NOTE — Telephone Encounter (Signed)
Transitions of Care Pharmacy   Call attempted for a pharmacy transitions of care follow-up. HIPAA appropriate voicemail was left with call back information provided.   Call attempt #3. Will no longer attempt follow up calls for [place]

## 2020-08-20 ENCOUNTER — Telehealth: Payer: Self-pay | Admitting: Student

## 2020-08-20 NOTE — Telephone Encounter (Signed)
TOC HFU appointment scheduled for 08/30/2020 at 3:15 pm with Dr Laural Golden.  Please refer to message below.  Unable to contact patient via telephone detailed message left and appointment mailed to patient.

## 2020-08-20 NOTE — Telephone Encounter (Signed)
-----   Message from Jeralyn Bennett, MD sent at 08/17/2020  3:27 PM EDT ----- Regarding: Hospital Follow up appointment needed Hello,   Could you please try to arrange Edwin Wall a hospital follow up appointment? (Apparently it has been hard to reach him per recent notes...)   Dr. Konrad Penta

## 2020-08-28 NOTE — Telephone Encounter (Signed)
Patient had HFU on 6/13 at his PCP's office. He has cancelled HFU at [facility] for 6/30.

## 2020-08-30 ENCOUNTER — Encounter: Payer: Medicare Other | Admitting: Student

## 2020-09-10 ENCOUNTER — Encounter: Payer: Self-pay | Admitting: Physical Medicine and Rehabilitation

## 2020-09-10 ENCOUNTER — Other Ambulatory Visit: Payer: Self-pay

## 2020-09-10 ENCOUNTER — Encounter
Payer: Medicare Other | Attending: Physical Medicine and Rehabilitation | Admitting: Physical Medicine and Rehabilitation

## 2020-09-10 VITALS — BP 122/85 | HR 76 | Temp 98.4°F | Ht 70.0 in | Wt 223.4 lb

## 2020-09-10 DIAGNOSIS — R269 Unspecified abnormalities of gait and mobility: Secondary | ICD-10-CM | POA: Diagnosis present

## 2020-09-10 DIAGNOSIS — G8191 Hemiplegia, unspecified affecting right dominant side: Secondary | ICD-10-CM | POA: Insufficient documentation

## 2020-09-10 DIAGNOSIS — R4701 Aphasia: Secondary | ICD-10-CM | POA: Diagnosis present

## 2020-09-10 DIAGNOSIS — I4891 Unspecified atrial fibrillation: Secondary | ICD-10-CM | POA: Insufficient documentation

## 2020-09-10 DIAGNOSIS — I63512 Cerebral infarction due to unspecified occlusion or stenosis of left middle cerebral artery: Secondary | ICD-10-CM | POA: Insufficient documentation

## 2020-09-10 NOTE — Patient Instructions (Signed)
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   Still on Eliquis for Afib; , Crestor, and Farxiga- to protect kidneys AND to bring BG's down; - (has hx of colon CA)  If bladder incontinence still an issue after Sept 1st- have PCP send to Urology to see if they can help? Likely due to stroke.  Has appointment with [facility] 8/3 appointment to start, so won't start new referral.  Call me if need another referral for outpt therapy.  Amantadine 200 mg daily- can try after finishing current prescription, wean to 100 mg daily x 3 days, then stop- IF his symptoms get worse of speech/aphasia- just restart the Amantadine. And let me know.  F/U in 3 months. If still has deficits, needs to see me again.

## 2020-09-10 NOTE — Progress Notes (Signed)
Subjective:    Patient ID: Edwin Wall, male    DOB: [birth year], 80 y.o.   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    Great at home Walking with cane- uses for safety.  Not walking too much-  Walks around the house and back outside to the car- been to the grocery store and [facility] several times.  Can't walk in store- uses scooter in store.   Speech recovery- speech "not as good as it should be".  Has difficulties still at coming up with the right words per pt.  Wife says memory is also not great.   Bowels doing well- LBM last night.  Not taking miralax.   Done with everything for pneumonia- feels good.   Has had bladder incontinence and bowel issues- once in awhile cannot get to bathroom in time.  1x/week.    Not getting therapy - didn't get any therapy to home.     Pain Inventory Average Pain 0 Pain Right Now 0 My pain is no pain  LOCATION OF PAIN  no pain  BOWEL Number of stools per week: 1-3 Oral laxative use  yes prn Type of laxative sennakot Enema or suppository use No  History of colostomy No  Incontinent Yes   BLADDER Normal In and out cath, frequency na Able to self cath  na Bladder incontinence Yes  Frequent urination No  Leakage with coughing No  Difficulty starting stream No  Incomplete bladder emptying No    Mobility use a cane use a walker how many minutes can you walk? 15 ability to climb steps?  yes do you drive?  no  Function retired  Neuro/Psych bladder control problems bowel control problems  Prior Studies HFU  Physicians involved in your care HFU   Family History  Problem Relation Age of Onset   Cancer Mother        LUNG   COPD Father    Social History   Socioeconomic History   Marital status: Married    Spouse name: Not on file   Number of children: Not on file    Years of education: Not on file   Highest education level: Not on file  Occupational History   Not on file  Tobacco Use   Smoking status: Never   Smokeless tobacco: Never  Vaping Use   Vaping Use: Never used  Substance and Sexual Activity   Alcohol use: No   Drug use: No   Sexual activity: Never  Other Topics Concern   Not on file  Social History Narrative   Not on file   Social Determinants of Health   Financial Resource Strain: Not on file  Food Insecurity: Not on file  Transportation Needs: Not on file  Physical Activity: Not on file  Stress: Not on file  Social Connections: Not on file   Past Surgical History:  Procedure Laterality Date   COLECTOMY  12/17/2016   lap; partial sigmoid colectomy/notes 12/17/2016   COLONOSCOPY W/ BIOPSIES AND POLYPECTOMY  11/11/2016   LAPAROSCOPIC SIGMOID COLECTOMY [name] 12/17/2016   Procedure: LAPAROSCOPIC SIGMOID COLECTOMY;  Surgeon: [name], MD;  Location: [place];  Service: General;  Laterality: [name];   NASAL SEPTUM SURGERY     TONSILLECTOMY     Past Medical History:  Diagnosis Date   Anxiety  Arthritis    Cataract    Colon cancer ([place]) dx'd 11/2016   "stage 3"   Gout    Gout    Hemorrhoids    History of kidney stones    "passed it"   Hypertension    Pre-diabetes    Temp 98.4 F (36.9 C) (Oral)   Ht 5\' 10"  (1.778 m)   Wt 223 lb 6.4 oz (101.3 kg)   BMI 32.05 kg/m   Opioid Risk Score:   Fall Risk Score:  `1  Depression screen PHQ 2/9  Depression screen PHQ 2/9 09/10/2020  Decreased Interest 0  Down, Depressed, Hopeless 0  PHQ - 2 Score 0  Altered sleeping 0  Tired, decreased energy 0  Change in appetite 0  Feeling bad or failure about yourself  1  Trouble concentrating 0  Moving slowly or fidgety/restless 1  Suicidal thoughts 0  PHQ-9 Score 2  Difficult doing work/chores Not difficult at all      Review of Systems  Gastrointestinal:        Bowel control problems  Genitourinary:        Bladder  control problems  All other systems reviewed and are negative.     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   Still on Eliquis for Afib; , Crestor, and Farxiga- to protect kidneys AND to bring BG's down; - (has hx of colon CA)  If bladder incontinence still an issue after Sept 1st- have PCP send to Urology to see if they can help? Likely due to stroke.  Has appointment with [facility] 8/3 appointment to start, so won't start new referral.  Call me if need another referral for outpt therapy.  Amantadine 200 mg daily- can try after finishing current prescription, wean to 100 mg daily x 3 days, then stop- IF his symptoms get worse of speech/aphasia- just restart the Amantadine. And let me know.  F/U in 3 months. If still has deficits, needs to see me again.   6. Placed SLP and PT referrals- will have pts wife call to make appointments outpt.    I spent a total of 28 minutes on visit- going over medications specifically 1 med at a time, and how to stop Amantadine and start therapy.

## 2020-09-20 ENCOUNTER — Ambulatory Visit: Payer: Medicare Other

## 2020-09-20 ENCOUNTER — Encounter: Payer: Medicare Other | Admitting: Occupational Therapy

## 2020-09-20 ENCOUNTER — Ambulatory Visit: Payer: Medicare Other | Admitting: Physical Therapy

## 2020-09-25 ENCOUNTER — Other Ambulatory Visit: Payer: Self-pay

## 2020-09-25 ENCOUNTER — Ambulatory Visit: Payer: Medicare Other

## 2020-09-25 ENCOUNTER — Ambulatory Visit: Payer: Medicare Other | Attending: Family Medicine

## 2020-09-25 DIAGNOSIS — R4701 Aphasia: Secondary | ICD-10-CM

## 2020-09-25 DIAGNOSIS — M6281 Muscle weakness (generalized): Secondary | ICD-10-CM

## 2020-09-25 DIAGNOSIS — R41841 Cognitive communication deficit: Secondary | ICD-10-CM

## 2020-09-25 DIAGNOSIS — R2681 Unsteadiness on feet: Secondary | ICD-10-CM | POA: Insufficient documentation

## 2020-09-25 DIAGNOSIS — I63512 Cerebral infarction due to unspecified occlusion or stenosis of left middle cerebral artery: Secondary | ICD-10-CM | POA: Insufficient documentation

## 2020-09-25 NOTE — Therapy (Signed)
[facility] [address] [place], [place], [address] Phone: [phone]   Fax:  [phone]  Physical Therapy Evaluation  Patient Details  Name: Edwin Wall MRN: [id number] Date of Birth: 12-15-1940 Referring Provider (PT): Dr. Courtney Heys   Encounter Date: 09/25/2020   PT End of Session - 09/25/20 1707     Visit Number 1    Number of Visits 9    Date for PT Re-Evaluation 10/30/20    Authorization Type [id number]    Progress Note Due on Visit 9    PT Start Time 1500    PT Stop Time 1530    PT Time Calculation (min) 30 min    Equipment Utilized During Treatment Gait belt    Activity Tolerance Patient tolerated treatment well    Behavior During Therapy [facility] for tasks assessed/performed             Past Medical History:  Diagnosis Date   Anxiety    Arthritis    Cataract    Colon cancer ([place]) dx'd 11/2016   "stage 3"   Gout    Gout    Hemorrhoids    History of kidney stones    "passed it"   Hypertension    Pre-diabetes     Past Surgical History:  Procedure Laterality Date   COLECTOMY  12/17/2016   lap; partial sigmoid colectomy/notes 12/17/2016   COLONOSCOPY W/ BIOPSIES AND POLYPECTOMY  11/11/2016   LAPAROSCOPIC SIGMOID COLECTOMY [name] 12/17/2016   Procedure: LAPAROSCOPIC SIGMOID COLECTOMY;  Surgeon: [name], MD;  Location: [facility];  Service: General;  Laterality: [name];   NASAL SEPTUM SURGERY     TONSILLECTOMY      There were no vitals filed for this visit.    Subjective Assessment - 09/25/20 1658     Subjective Patient feels he is performing very well, ambulates with a cane, negotiates steps at home and has gone grocery shopping with spouse using scooter.  Denies pain                [facility] PT Assessment - 09/25/20 0001       Assessment   Medical Diagnosis L MCA    Referring Provider (PT) Dr. Courtney Heys    Onset Date/Surgical Date 07/25/20    Next MD Visit 10/02/20     Prior Therapy none      Precautions   Precautions Fall      Restrictions   Weight Bearing Restrictions No      Balance Screen   Has the patient fallen in the past 6 months No      [place] residence    Living Arrangements Spouse/significant other    Type of [place] to enter    Home Layout One level      Prior Function   Level of Independence Independent      Sensation   Light Touch Appears Intact    Additional Comments pitting edema in BLEs      Coordination   Gross Motor Movements are Fluid and Coordinated Yes    Heel Shin Test WFL      ROM / Strength   AROM / PROM / Strength Strength      Strength   Right/Left Hip Right;Left    Right Hip Flexion 4-/5    Right Hip Extension 4/5    Right Hip ABduction 3+/5    Left Hip Flexion 4-/5  Left Hip Extension 4/5    Left Hip ABduction 3+/5    Right/Left Knee Right;Left    Right Knee Flexion 4-/5    Right Knee Extension 4-/5    Left Knee Flexion 4-/5    Left Knee Extension 4-/5      Transfers   Transfers Sit to Stand;Stand to Sit    Sit to Stand 5: Supervision    Sit to Stand Details Tactile cues for sequencing    Five time sit to stand comments  11.9s    Comments 5x STS w/ UE assist      Ambulation/Gait   Ambulation/Gait Yes    Ambulation/Gait Assistance 5: Supervision;4: Min guard    Ambulation Distance (Feet) 100 Feet    Assistive device Straight cane    Gait Pattern Step-through pattern;Decreased arm swing - left;Decreased step length - right;Decreased step length - left;Trunk flexed    Ambulation Surface Level;Indoor    Stairs Yes    Stairs Assistance 5: Supervision;4: Min guard    Stair Management Technique One rail Right;Alternating pattern    Number of Stairs 4      Dynamic Gait Index   Level Surface Normal    Change in Gait Speed Mild Impairment    Gait with Horizontal Head Turns Mild Impairment    Gait with Vertical Head Turns Mild  Impairment    Gait and Pivot Turn Mild Impairment    Step Over Obstacle Mild Impairment    Step Around Obstacles Normal    Steps Mild Impairment    Total Score 18                        Objective measurements completed on examination: See above findings.               PT Education - 09/25/20 1705     Education Details Patient and spouse educated on eval findings, rehab potential and POC    Person(s) Educated Patient;Spouse    Methods Explanation    Comprehension Verbalized understanding              PT Short Term Goals - 09/25/20 1734       PT SHORT TERM GOAL #1   Title Patient to become I in [place] TBD    Time 2    Period Weeks    Status New    Target Date 10/16/20      PT SHORT TERM GOAL #2   Title Patient to demo 4/5 sterngth in BLEs    Baseline 4-/5 B hip and knee flexion    Time 2    Period Weeks    Status New    Target Date 10/16/20      PT SHORT TERM GOAL #3   Title Patient to ambulate 532f with S and SPC    Baseline 104fwith SBA and SPC    Time 2    Period Weeks    Status New    Target Date 10/16/20               PT Long Term Goals - 09/25/20 1159       PT LONG TERM GOAL #1   Title Patient to demo final HEP back to PT w/o cuing    Baseline TBD    Time 4    Period Weeks    Status New    Target Date 10/30/20      PT LONG TERM  GOAL #2   Title Patient to ambulate 1023f with LRAD under S    Baseline 1087fwith SPC and S    Time 4    Period Weeks    Status New    Target Date 10/30/20      PT LONG TERM GOAL #3   Title Patient to negotiate a full flight of stairs with most appropriate pattern    Baseline 4 steps alternating pattern with single rail    Time 4    Period Weeks    Status New    Target Date 10/30/20      PT LONG TERM GOAL #4   Title Patient to increase DGI score to 22    Baseline 18 DGI score    Time 4    Period Weeks    Status New    Target Date 10/30/20                     Plan - 09/25/20 1741     Clinical Impression Statement [facility] on [facility] wioth f/u care at [facility] He has begun walking with a cane, negotiating stairs and shopping with his wife using a scooter.  He denies pain.  He is able to ambulate with a SPC, transfer with minor use of hands and negotiate stairs with a single rail.  He scores well on 5x STS and mCTSIB but scores under 19 on the DGI elevating his risk of falls. He does demo some word finding difficulties and minor issues following single step commands.  He is a good candidate for OPPT.    Personal Factors and Comorbidities Age    Examination-Activity Limitations Locomotion Level;Stairs    Stability/Clinical Decision Making Evolving/Moderate complexity    Clinical Decision Making Low    Rehab Potential Good    PT Frequency 2x / week    PT Duration 4 weeks    PT Treatment/Interventions ADLs/Self Care Home Management;Aquatic Therapy;DME Instruction;Gait training;Stair training;Functional mobility training;Therapeutic activities;Therapeutic exercise;Balance training;Neuromuscular re-education;Patient/family education    PT Next Visit Plan establish HEP, initiate gait and balance training    PT Home Exercise Plan TBD    Consulted and Agree with Plan of Care Patient;Family member/caregiver    Family Member Consulted spouse             Patient will benefit from skilled therapeutic intervention in order to improve the following deficits and impairments:  Abnormal gait, Decreased endurance, Increased edema, Decreased activity tolerance, Decreased strength, Decreased balance, Difficulty walking  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)  Unsteadiness on feet  Muscle weakness (generalized)     Problem List Patient Active Problem List   Diagnosis Date Noted   Abnormality of gait 09/10/2020   Leukocytosis    Somnolence    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/2020   Acute right hemiparesis  (HCC) 07/19/2020   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 [place], [place], [address] Phone: [phone]   Fax:  [phone]  Name: Edwin Wall MRN: [id number] Date of Birth: 1940-07-09

## 2020-09-27 NOTE — Addendum Note (Signed)
Addended by: Garald Balding B on: 09/27/2020 12:36 PM   Modules accepted: Orders

## 2020-09-27 NOTE — Therapy (Addendum)
[facility] [address] [place], [place], [address] Phone: [phone]   Fax:  [phone]  Speech Language Pathology Evaluation  Patient Details  Name: Edwin Wall MRN: [id number] Date of Birth: 1940/04/19 Referring Provider (SLP): Courtney Heys, MD   Encounter Date: 09/25/2020   End of Session - 09/27/20 1155     Visit Number 1    Number of Visits 25    Date for SLP Re-Evaluation 12/24/20    SLP Start Time 1406    SLP Stop Time  1446    SLP Time Calculation (min) 40 min    Activity Tolerance Patient tolerated treatment well             Past Medical History:  Diagnosis Date   Anxiety    Arthritis    Cataract    Colon cancer ([place]) dx'd 11/2016   "stage 3"   Gout    Gout    Hemorrhoids    History of kidney stones    "passed it"   Hypertension    Pre-diabetes     Past Surgical History:  Procedure Laterality Date   COLECTOMY  12/17/2016   lap; partial sigmoid colectomy/notes 12/17/2016   COLONOSCOPY W/ BIOPSIES AND POLYPECTOMY  11/11/2016   LAPAROSCOPIC SIGMOID COLECTOMY [name] 12/17/2016   Procedure: LAPAROSCOPIC SIGMOID COLECTOMY;  Surgeon: [name], MD;  Location: [facility];  Service: General;  Laterality: [name];   NASAL SEPTUM SURGERY     TONSILLECTOMY      There were no vitals filed for this visit.   Subjective Assessment - 09/27/20 1140     Subjective "Today he was tyring to tell me he wanted his shirt but he kept saying the wrong name. He pointed at the closet and I knew he wanted a shirt."    Patient is accompained by: Family member   wife               SLP Evaluation [facility] - 09/27/20 0001       SLP Visit Information   SLP Received On 09/25/20    Referring Provider (SLP) Courtney Heys, MD    Onset Date 07-15-20    Medical Diagnosis lt MCA CVA      Subjective   Patient/Family Stated Goal Improve the accuracy of his language with family and others      Prior  Functional Status   Cognitive/Linguistic Baseline Within functional limits    Type of Home House     Lives With Spouse      Cognition   Overall Cognitive Status Difficult to assess    Difficult to assess due to Impaired communication      Auditory Comprehension   Overall Auditory Comprehension Impaired    Commands Impaired    Complex Commands 75-100% accurate    Other Conversation Comments Some occasional repetition of omre complex concepts in conversation was necessary for pt to comprehend.      Verbal Expression   Overall Verbal Expression Impaired    Level of Generative/Spontaneous Verbalization Conversation    Other Verbal Expression Comments Pt generated some semantic paraphasias and some paragramatic speech in conversation (e.g., "My talking - I can talk as long as I - I can commit to certain things I can't reach to.") Some simple conversation was completed with extra time needed, but with more complex topics pt exhibited vague and empty speech. When describing retired life - "Well I retired roughly 2000. It's just helter skelter. We just do what  we want to do when we want to do it." SLP noted he <> she substitution.      Oral Motor/Sensory Function   Overall Oral Motor/Sensory Function Appears within functional limits for tasks assessed      Motor Speech   Overall Motor Speech Appears within functional limits for tasks assessed      Standardized Assessments   Standardized Assessments  Other Assessment   Quick Aphasia Battery   Other Assessment 7.24 - moderate aphasia                             SLP Education - 09/27/20 1154     Education Details eval results, possible goals    Person(s) Educated Patient;Spouse    Methods Explanation    Comprehension Verbalized understanding;Need further instruction              SLP Short Term Goals - 09/27/20 1219       SLP SHORT TERM GOAL #1   Title Pt will provide functional 2-3 sentence responses (spoken)  to simple stimuli in at least 20 opportunities in 3 sessions    Time 4    Period Weeks    Status New    Target Date 10/27/20      SLP SHORT TERM GOAL #2   Title In 5 minutes mod complex conversation pt will demonstrate understanding with modified independence (compensations) x3 sessions    Time 6    Period Weeks    Status New    Target Date 11/09/20      SLP SHORT TERM GOAL #3   Title In 5 minutes mod complex conversation pt will demo functional verbal expression with use of language compensations x3 sessions    Time 6    Period Weeks    Status New    Target Date 11/09/20      SLP SHORT TERM GOAL #4   Title Pt will demo knowledge of expressive language errors in 5 minutes conversation with SLP nonverbal cues x3 sessions    Time 6    Period Weeks    Status New    Target Date 11/09/20      SLP SHORT TERM GOAL #5   Title pt will dmeo understanding of functional written material (to-do lists, simple notes, etc) with extra time, in 3 sessions    Time 4    Period Weeks    Status New    Target Date 10/26/20              SLP Long Term Goals - 09/27/20 1221       SLP LONG TERM GOAL #1   Title In 10 minutes mod complex conversation pt will demonstrate understanding with modified independence (compensations) in 3 sessions    Time 8    Period Weeks    Status New    Target Date 11/30/20      SLP LONG TERM GOAL #2   Title In 10 minutes mod complex conversation pt will demo functional verbal expression with use of language compensations x3 sessions    Time 8    Period Weeks    Status New    Target Date 11/30/20      SLP LONG TERM GOAL #3   Title Pt will demo knowledge of expressive language errors in 10 minutes conversation with SLP/listener nonverbal cues x3 sessions    Time 8    Period Weeks    Status New  Target Date 11/30/20      SLP LONG TERM GOAL #4   Title In 15 minutes mod complex conversation pt will demo functional verbal expression with use of language  compensations x3 sessions    Time 12    Period Weeks    Status New    Target Date 12/26/20      SLP LONG TERM GOAL #5   Title In 10 minutes mod complex conversation pt will demonstrate understanding with modified independence (compensations) in 3 sessions    Time 12    Period Weeks    Status New    Target Date 12/26/20              Plan - 09/27/20 1156     Clinical Impression Statement 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 He had ST on CIR. Pt's expressive language deficits extend into his writing. Pt's verbal expressive skills were c/b empty speech, some semantic paraphasias and some paragrammatism when pt trying to describe more complex concepts. Wife reports she needs to use clarifying questions but pt is also using some simple gestural compensations such as pointing which are successful at times. MLU appears mostly WNL. Pt was unaware of linguisitic errors he made approx. 30% of the time. Pt's written language is functional - in a simple shopping list pt wrote 3/10 items incorrectly ("size"/switch, "cumcumbers"/cucumbers, and "service"/screws). Pt indicated to SLP that he had written a shopping list today and completed it correctly even with errors. Mr. Ullery would benefit from skilled ST targeting improved accuracy of verbal expression, error awareness to increase his independence with verbal expression, and incr usage of a wider range of compensatory strategies to improve efficiency and flow of verbal expression. Pt states he would like to be more accurate in his speech and wife states she needs to occasionally ask clarifying questions for complete comprehension of pt's message.    Speech Therapy Frequency 2x / week    Duration 12 weeks    Treatment/Interventions Functional tasks;Compensatory techniques;Multimodal communcation approach;SLP instruction and  feedback;Cueing hierarchy;Language facilitation;Cognitive reorganization;Patient/family education;Internal/external aids    Potential to Achieve Goals Good    Consulted and Agree with Plan of Care Patient;Family member/caregiver    Family Member Consulted wife             Patient will benefit from skilled therapeutic intervention in order to improve the following deficits and impairments:   Aphasia  Cognitive communication deficit    Problem List Patient Active Problem List   Diagnosis Date Noted   Abnormality of gait 09/10/2020   Leukocytosis    Somnolence    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/2020   Acute right hemiparesis (HCC) 07/19/2020   Left middle cerebral artery stroke ([place]) 07/18/2020   AKI (acute kidney injury) ([place])    Prediabetes    New onset atrial fibrillation ([place])    Dysphagia    Hyperlipidemia 07/16/2020   Type II diabetes mellitus ([place]) 07/16/2020   Atrial fibrillation ([place]) 07/16/2020   Acute CVA (cerebrovascular accident) ([place]) 07/15/2020   Cancer of sigmoid colon ([place]) 12/17/2016   Obesity (BMI 30-39.9) 01/15/2011   Hypertension 01/15/2011   History of gout 01/15/2011   ED (erectile dysfunction) 01/15/2011    [facility] ,[place], [place]  09/27/2020, 12:36 PM  [facility] [address] [place], [place], [address] Phone: [phone]   Fax:  [phone]  Name: Jaymar Scarpaci  [place] MRN: [id number] Date of Birth: 1941-01-17

## 2020-10-01 ENCOUNTER — Ambulatory Visit: Payer: Medicare Other | Attending: Physical Medicine and Rehabilitation

## 2020-10-01 ENCOUNTER — Other Ambulatory Visit: Payer: Self-pay

## 2020-10-01 DIAGNOSIS — I63512 Cerebral infarction due to unspecified occlusion or stenosis of left middle cerebral artery: Secondary | ICD-10-CM | POA: Insufficient documentation

## 2020-10-01 DIAGNOSIS — R41841 Cognitive communication deficit: Secondary | ICD-10-CM | POA: Diagnosis present

## 2020-10-01 DIAGNOSIS — R278 Other lack of coordination: Secondary | ICD-10-CM | POA: Insufficient documentation

## 2020-10-01 DIAGNOSIS — R4701 Aphasia: Secondary | ICD-10-CM

## 2020-10-01 DIAGNOSIS — R41840 Attention and concentration deficit: Secondary | ICD-10-CM | POA: Diagnosis present

## 2020-10-01 DIAGNOSIS — R2681 Unsteadiness on feet: Secondary | ICD-10-CM | POA: Diagnosis present

## 2020-10-01 DIAGNOSIS — M6281 Muscle weakness (generalized): Secondary | ICD-10-CM | POA: Insufficient documentation

## 2020-10-02 ENCOUNTER — Encounter: Payer: Self-pay | Admitting: Adult Health

## 2020-10-02 ENCOUNTER — Ambulatory Visit (INDEPENDENT_AMBULATORY_CARE_PROVIDER_SITE_OTHER): Payer: Medicare Other | Admitting: Adult Health

## 2020-10-02 ENCOUNTER — Telehealth: Payer: Self-pay

## 2020-10-02 VITALS — BP 114/94 | HR 89 | Ht 68.0 in | Wt 214.5 lb

## 2020-10-02 DIAGNOSIS — E785 Hyperlipidemia, unspecified: Secondary | ICD-10-CM

## 2020-10-02 DIAGNOSIS — I4891 Unspecified atrial fibrillation: Secondary | ICD-10-CM | POA: Diagnosis not present

## 2020-10-02 DIAGNOSIS — I63412 Cerebral infarction due to embolism of left middle cerebral artery: Secondary | ICD-10-CM | POA: Diagnosis not present

## 2020-10-02 DIAGNOSIS — E119 Type 2 diabetes mellitus without complications: Secondary | ICD-10-CM

## 2020-10-02 NOTE — Telephone Encounter (Signed)
Referral for cardiology sent to [facility]. P: [phone].

## 2020-10-02 NOTE — Therapy (Signed)
[facility] [address] [place], [place], [address] Phone: [phone]   Fax:  [phone]  Speech Language Pathology Treatment  Patient Details  Name: Edwin Wall MRN: [id number] Date of Birth: 10/28/1940 Referring Provider (SLP): Courtney Heys, MD   Encounter Date: 10/01/2020   End of Session - 10/02/20 1008     Visit Number 2    Number of Visits 25    Date for SLP Re-Evaluation 12/24/20    SLP Start Time 1701    SLP Stop Time  [id number]    SLP Time Calculation (min) 44 min    Activity Tolerance Patient tolerated treatment well             Past Medical History:  Diagnosis Date   Anxiety    Arthritis    Cataract    Colon cancer ([facility]) dx'd 11/2016   "stage 3"   Gout    Gout    Hemorrhoids    History of kidney stones    "passed it"   Hypertension    Pre-diabetes     Past Surgical History:  Procedure Laterality Date   COLECTOMY  12/17/2016   lap; partial sigmoid colectomy/notes 12/17/2016   COLONOSCOPY W/ BIOPSIES AND POLYPECTOMY  11/11/2016   LAPAROSCOPIC SIGMOID COLECTOMY [name] 12/17/2016   Procedure: LAPAROSCOPIC SIGMOID COLECTOMY;  Surgeon: [name], MD;  Location: [facility];  Service: General;  Laterality: [name];   NASAL SEPTUM SURGERY     TONSILLECTOMY      There were no vitals filed for this visit.   Subjective Assessment - 10/02/20 1002     Subjective "trouble transferring to real thing" patient explaination of paraphasias    Patient is accompained by: Family member   wife   Currently in Pain? No/denies                   ADULT SLP TREATMENT - 10/02/20 1003       General Information   Behavior/Cognition Alert;Cooperative;Pleasant mood;Requires cueing      Treatment Provided   Treatment provided Cognitive-Linquistic      Cognitive-Linquistic Treatment   Treatment focused on Aphasia;Patient/family/caregiver education    Skilled Treatment Given moderate SLP prompting  to identify deficits pt is currently experiencing, Claiborne Billings reports "trouble transferring to the real thing" with example of semantic paraphasia provided. SLP targeted simple divergent naming of sports and favorite restaurants. Pt able to name 3 sports and 2 restaurants independently. SLP provided usual mod semantic and phonemic cues to name 4 additional sports. Pt's wife expressed she was not aware that that task would be so challenging for him. Pt utilized description strategy x1 to aid naming for one of favorite restaurants, with positive reinforcement provided for use of strategy. Intermittent paraphasias exhibited in conversation, with no overt awareness of errors exhibited by patient. SLP educated patient and wife on providing patient with visual and verbal feedback if paraphasia occurs to heighten pt's awareness. Both verbalized understanding.      Assessment / Recommendations / Plan   Plan Continue with current plan of care      Progression Toward Goals   Progression toward goals Progressing toward goals              SLP Education - 10/02/20 1008     Education Details verbal/visual feedback (questioning look) when paraphasias occur to heighten awareness    Person(s) Educated Patient;Spouse    Methods Explanation;Demonstration;Handout    Comprehension Verbalized understanding;Returned demonstration;Need further instruction  SLP Short Term Goals - 10/01/20 1706       SLP SHORT TERM GOAL #1   Title Pt will provide functional 2-3 sentence responses (spoken) to simple stimuli in at least 20 opportunities in 3 sessions    Time 4    Period Weeks    Status On-going    Target Date 10/27/20      SLP SHORT TERM GOAL #2   Title In 5 minutes mod complex conversation pt will demonstrate understanding with modified independence (compensations) x3 sessions    Time 6    Period Weeks    Status On-going    Target Date 11/09/20      SLP SHORT TERM GOAL #3   Title In 5  minutes mod complex conversation pt will demo functional verbal expression with use of language compensations x3 sessions    Time 6    Period Weeks    Status On-going    Target Date 11/09/20      SLP SHORT TERM GOAL #4   Title Pt will demo knowledge of expressive language errors in 5 minutes conversation with SLP nonverbal cues x3 sessions    Time 6    Period Weeks    Status On-going    Target Date 11/09/20      SLP SHORT TERM GOAL #5   Title pt will dmeo understanding of functional written material (to-do lists, simple notes, etc) with extra time, in 3 sessions    Time 4    Period Weeks    Status On-going    Target Date 10/26/20              SLP Long Term Goals - 10/01/20 1748       SLP LONG TERM GOAL #1   Title In 10 minutes mod complex conversation pt will demonstrate understanding with modified independence (compensations) in 3 sessions    Time 8    Period Weeks    Status On-going    Target Date 11/30/20      SLP LONG TERM GOAL #2   Title In 10 minutes mod complex conversation pt will demo functional verbal expression with use of language compensations x3 sessions    Time 8    Period Weeks    Status On-going    Target Date 11/30/20      SLP LONG TERM GOAL #3   Title Pt will demo knowledge of expressive language errors in 10 minutes conversation with SLP/listener nonverbal cues x3 sessions    Time 8    Period Weeks    Status On-going    Target Date 11/30/20      SLP LONG TERM GOAL #4   Title In 15 minutes mod complex conversation pt will demo functional verbal expression with use of language compensations x3 sessions    Time 12    Period Weeks    Status On-going    Target Date 12/26/20      SLP LONG TERM GOAL #5   Title In 10 minutes mod complex conversation pt will demonstrate understanding with modified independence (compensations) in 3 sessions    Time 12    Period Weeks    Status On-going    Target Date 12/26/20              Plan - 10/02/20  1010     Clinical Impression Statement Edwin Wall presents today with 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 He had ST on CIR. Pt's verbal expressive skills were c/b empty speech, some semantic paraphasias and some paragrammatism when pt trying to describe more complex concepts. Wife was not aware of how challenging naming was for patient when SLP engaged patient in simple divergent naming tasks. Usual moderate cues required to facilitate naming this session. Pt was inconsistently aware of linguisitic errors. Edwin Wall would benefit from skilled ST targeting improved accuracy of verbal expression, error awareness to increase his independence with verbal expression, and incr usage of a wider range of compensatory strategies to improve efficiency and flow of verbal expression. Pt states he would like to be more accurate in his speech and wife states she needs to occasionally ask clarifying questions for complete comprehension of pt's message.    Speech Therapy Frequency 2x / week    Duration 12 weeks    Treatment/Interventions Functional tasks;Compensatory techniques;Multimodal communcation approach;SLP instruction and feedback;Cueing hierarchy;Language facilitation;Cognitive reorganization;Patient/family education;Internal/external aids    Potential to Achieve Goals Good    Consulted and Agree with Plan of Care Patient;Family member/caregiver    Family Member Consulted wife             Patient will benefit from skilled therapeutic intervention in order to improve the following deficits and impairments:   Aphasia  Cognitive communication deficit    Problem List Patient Active Problem List   Diagnosis Date Noted   Abnormality of gait 09/10/2020   Leukocytosis    Somnolence    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/2020   Acute right hemiparesis ([facility]) 07/19/2020   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 [place], [place], [address] Phone: [phone]   Fax:  [phone]   Name: Edwin Wall MRN: [id number] Date of Birth: [birth year]

## 2020-10-02 NOTE — Patient Instructions (Signed)
Continue working with therapies for likely ongoing stroke recovery  Continue Eliquis (apixaban) daily  and Crestor for secondary stroke prevention  Referral will be placed to cardiology to establish care for newly diagnosed atrial fibrillation and Eliquis management  Continue to follow up with PCP regarding cholesterol and diabetes management  Maintain strict control of diabetes with hemoglobin A1c goal below 7% and cholesterol with LDL cholesterol (bad cholesterol) goal below 70 mg/dL.       Followup in the future with me in 4 months or call earlier if needed       Thank you for coming to see [place] at [facility]. I hope we have been able to provide you high quality care today.  You may receive a patient satisfaction survey over the next few weeks. We would appreciate your feedback and comments so that we may continue to improve ourselves and the health of our patients.

## 2020-10-02 NOTE — Progress Notes (Signed)
[facility] [address] [place]. [place] [address] [phone]       HOSPITAL FOLLOW UP NOTE  [name] Date of Birth:  [birth year] Medical Record Number:  [id number]   Reason for Referral:  hospital stroke follow up    SUBJECTIVE:   CHIEF COMPLAINT:  Chief Complaint  Patient presents with   Follow-up    Rm 3 with wife here for HSP f/u- Reports he has been doing well no lingering concerns.     HPI:   Edwin Wall is a 80M with preDM2, HTN, OA, anxiety, who presented on 07/16/2018 with speech difficulty and R facial droop upon awakening. NIHSS 9.  Personally reviewed hospitalization pertinent progress notes, lab work and imaging summary provided.  Evaluated by Dr. 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 Fib (noted in ED).  EF 50 to 55%.  Aspirin initiated and recommended initiating Eliquis 5 to 7 days poststroke for CHA2DS2-VASc score of 5.  LDL 127 -initiated Crestor 40 mg daily.  Uncontrolled DM with A1c 7.1.  HTN stable.  No prior stroke history.  Evaluated by therapies and recommended discharge to CIR. CIR from 5/18 - 6/3.   Today, 10/02/2020, Edwin Wall is being seen for hospital follow-up accompanied by his wife.  Reports residual speech difficulty with continued improvement since discharge.  He is currently working with SLP.  He denies any right-sided weakness or gait difficulty.  Ambulates without assistive device and denies any recent falls.  Denies new stroke/TIA symptoms.  Reports compliance on Eliquis and Crestor tolerating without side effects.  Blood pressure today 114/94 -does not routinely monitor at home.  He has since had follow-up with PCP since discharge.  He does not have an established cardiologist.  No further concerns at this time.     ROS:   14 system review of systems performed and negative with exception of those listed in HPI  PMH:  Past Medical History:  Diagnosis  Date   Anxiety    Arthritis    Cataract    Colon cancer ([facility]) dx'd 11/2016   "stage 3"   Gout    Gout    Hemorrhoids    History of kidney stones    "passed it"   Hypertension    Pre-diabetes     PSH:  Past Surgical History:  Procedure Laterality Date   COLECTOMY  12/17/2016   lap; partial sigmoid colectomy/notes 12/17/2016   COLONOSCOPY W/ BIOPSIES AND POLYPECTOMY  11/11/2016   LAPAROSCOPIC SIGMOID COLECTOMY [name] 12/17/2016   Procedure: LAPAROSCOPIC SIGMOID COLECTOMY;  Surgeon: [name], MD;  Location: [facility];  Service: General;  Laterality: [name];   NASAL SEPTUM SURGERY     TONSILLECTOMY      Social History:  Social History   Socioeconomic History   Marital status: Married    Spouse name: Not on file   Number of children: Not on file   Years of education: Not on file   Highest education level: Not on file  Occupational History   Not on file  Tobacco Use   Smoking status: Never   Smokeless tobacco: Never  Vaping Use   Vaping Use: Never used  Substance and Sexual Activity   Alcohol use: No   Drug use: No   Sexual activity: Never  Other Topics Concern   Not on file  Social History Narrative   Not on file   Social Determinants of Health  Financial Resource Strain: Not on file  Food Insecurity: Not on file  Transportation Needs: Not on file  Physical Activity: Not on file  Stress: Not on file  Social Connections: Not on file  Intimate Partner Violence: Not on file    Family History:  Family History  Problem Relation Age of Onset   Cancer Mother        LUNG   COPD Father     Medications:   Current Outpatient Medications on File Prior to Visit  Medication Sig Dispense Refill   amantadine (SYMMETREL) 100 MG capsule Take 2 capsules (200 mg total) by mouth daily. 60 capsule 0   apixaban (ELIQUIS) 5 MG TABS tablet Take 1 tablet (5 mg total) by mouth 2 (two) times daily. 60 tablet 0   colestipol (COLESTID) 1 g tablet Take 1 tablet (1 g total) by mouth  2 (two) times daily. 30 tablet 0   dapagliflozin propanediol (FARXIGA) 10 MG TABS tablet Take 1 tablet (10 mg total) by mouth daily. 30 tablet 0   LORazepam (ATIVAN) 1 MG tablet Take 1 tablet (1 mg total) by mouth daily as needed for anxiety. 30 tablet    rosuvastatin (CRESTOR) 40 MG tablet Take 1 tablet (40 mg total) by mouth daily. 30 tablet 0   senna-docusate (SENOKOT-S) 8.6-50 MG tablet Take 1 tablet by mouth 2 (two) times daily. 60 tablet 0   No current facility-administered medications on file prior to visit.    Allergies:  No Known Allergies    OBJECTIVE:  Physical Exam  Vitals:   10/02/20 1117  BP: (!) 114/94  Pulse: 89  SpO2: 97%  Weight: 214 lb 8 oz (97.3 kg)  Height: '5\' 8"'$  (1.727 m)   Body mass index is 32.61 kg/m. No results found.  Post stroke PHQ 2/9 Depression screen PHQ 2/9 09/10/2020  Decreased Interest 0  Down, Depressed, Hopeless 0  PHQ - 2 Score 0  Altered sleeping 0  Tired, decreased energy 0  Change in appetite 0  Feeling bad or failure about yourself  1  Trouble concentrating 0  Moving slowly or fidgety/restless 1  Suicidal thoughts 0  PHQ-9 Score 2  Difficult doing work/chores Not difficult at all     General: well developed, well nourished, very pleasant elderly Caucasian male, seated, in no evident distress Head: head normocephalic and atraumatic.   Neck: supple with no carotid or supraclavicular bruits Cardiovascular: irregular rate and rhythm, no murmurs Musculoskeletal: no deformity Skin:  no rash/petichiae Vascular:  Normal pulses all extremities   Neurologic Exam Mental Status: Awake and fully alert.  Mild expressive aphasia.  Unable to appreciate receptive aphasia.  Able to follow commands without difficulty.  Oriented to place and time. Recent and remote memory intact. Attention span, concentration and fund of knowledge appropriate. Mood and affect appropriate.  Cranial Nerves: Fundoscopic exam reveals sharp disc margins. Pupils  equal, briskly reactive to light. Extraocular movements full without nystagmus. Visual fields full to confrontation. Hearing intact. Facial sensation intact. Face, tongue, palate moves normally and symmetrically.  Motor: Normal bulk and tone. Normal strength in all tested extremity muscles except slight decrease right hand dexterity and hip flexor weakness Sensory.: intact to touch , pinprick , position and vibratory sensation.  Coordination: Rapid alternating movements normal in all extremities except slightly decreased right hand. Finger-to-nose and heel-to-shin performed accurately bilaterally.  Mildly orbits left arm over right arm. Gait and Station: Arises from chair without difficulty. Stance is normal. Gait demonstrates normal stride length  and mild unsteadiness without use of assistive device.  Reflexes: 1+ and symmetric. Toes downgoing.     NIHSS  1 Modified Rankin  2      ASSESSMENT: Edwin Wall is a 80 y.o. year old male L MCA stroke in setting of left M2 occlusion on 07/15/2020 likely from embolic source in setting of newly diagnosed A. fib noted in ED. Vascular risk factors include newly diagnosed A. fib, HTN, HLD, DM, advanced age, obesity and high risk for sleep apnea.      PLAN:  L MCA stroke :  Residual deficit: Mild expressive aphasia and right hemiparesis -continue to work with therapies for likely ongoing recovery.  Continue Eliquis (apixaban) daily  and Crestor 40 mg daily for secondary stroke prevention.   Discussed secondary stroke prevention measures and importance of close PCP follow up for aggressive stroke risk factor management. I have gone over the pathophysiology of stroke, warning signs and symptoms, risk factors and their management in some detail with instructions to go to the closest emergency room for symptoms of concern. Atrial fibrillation, new dx: On Eliquis 5 mg twice daily for CHA2DS2-VASc score 5.  Currently in atrial fibrillation although  asymptomatic.  Referral placed to cardiology to establish care.  Does endorse mild snoring but no other associated sleep apnea symptoms nor is patient interested in pursuing sleep study HLD: LDL goal <70. Recent LDL 127 -continue Crestor 40 mg daily -request follow-up with PCP in the next 1 to 2 months for repeat lipid panel ongoing prescribing of statin.  DMII: A1c goal<7.0. Recent A1c 7.1 routinely monitored by PCP.      Follow up in 4 months or call earlier if needed   CC:  [facility] provider: Dr. Leonie Man PCP: [name], MD    I spent 46 minutes of face-to-face and non-face-to-face time with patient and wife.  This included previsit chart review including review of recent hospitalization, lab review, study review, order entry, electronic health record documentation, patient education regarding recent stroke and etiology, secondary stroke prevention measures and importance of managing stroke risk factors, residual stroke deficits and likely further recovery and answered all other questions to patient and wife's satisfaction  Frann Rider, AGNP-BC  [facility] [address] [place], [place] [address]  Phone [phone] Fax [phone] Note: This document was prepared with digital dictation and possible smart phrase technology. Any transcriptional errors that result from this process are unintentional.

## 2020-10-04 NOTE — Progress Notes (Signed)
I agree with the above plan 

## 2020-10-12 ENCOUNTER — Ambulatory Visit: Payer: Medicare Other

## 2020-10-12 ENCOUNTER — Other Ambulatory Visit: Payer: Self-pay

## 2020-10-12 DIAGNOSIS — R4701 Aphasia: Secondary | ICD-10-CM | POA: Diagnosis not present

## 2020-10-12 DIAGNOSIS — R41841 Cognitive communication deficit: Secondary | ICD-10-CM

## 2020-10-12 DIAGNOSIS — M6281 Muscle weakness (generalized): Secondary | ICD-10-CM

## 2020-10-12 DIAGNOSIS — R2681 Unsteadiness on feet: Secondary | ICD-10-CM

## 2020-10-12 DIAGNOSIS — I63512 Cerebral infarction due to unspecified occlusion or stenosis of left middle cerebral artery: Secondary | ICD-10-CM

## 2020-10-12 NOTE — Therapy (Signed)
[facility] [address] [place], [place], [address] Phone: [phone]   Fax:  [phone]  Physical Therapy Treatment  Patient Details  Name: Edwin Wall MRN: [id number] Date of Birth: [birth year] Referring Provider (PT): Dr. Courtney Heys   Encounter Date: 10/12/2020   PT End of Session - 10/12/20 1457     Visit Number 2    Number of Visits 9    Date for PT Re-Evaluation 10/30/20    Authorization Type [id number]    Progress Note Due on Visit 9    PT Start Time [id number]    PT Stop Time 1530    PT Time Calculation (min) 45 min    Equipment Utilized During Treatment Gait belt    Activity Tolerance Patient tolerated treatment well    Behavior During Therapy [facility] for tasks assessed/performed             Past Medical History:  Diagnosis Date   Anxiety    Arthritis    Cataract    Colon cancer ([place]) dx'd 11/2016   "stage 3"   Gout    Gout    Hemorrhoids    History of kidney stones    "passed it"   Hypertension    Pre-diabetes     Past Surgical History:  Procedure Laterality Date   COLECTOMY  12/17/2016   lap; partial sigmoid colectomy/notes 12/17/2016   COLONOSCOPY W/ BIOPSIES AND POLYPECTOMY  11/11/2016   LAPAROSCOPIC SIGMOID COLECTOMY [name] 12/17/2016   Procedure: LAPAROSCOPIC SIGMOID COLECTOMY;  Surgeon: [name], MD;  Location: [facility];  Service: General;  Laterality: [name];   NASAL SEPTUM SURGERY     TONSILLECTOMY      There were no vitals filed for this visit.   Subjective Assessment - 10/12/20 1456     Subjective No new c/o, some difficulty with step and stairs    Patient is accompained by: Family member    Currently in Pain? No/denies                               [facility] Adult PT Treatment/Exercise - 10/12/20 0001       Transfers   Transfers Sit to Stand    Sit to Stand 4: Min guard    Comments 5x STS w/arms crossed requiring MinA, instructed to stand with UE  support but sit with arms crossed      Ambulation/Gait   Ambulation/Gait Yes    Ambulation/Gait Assistance 5: Supervision    Ambulation Distance (Feet) 100 Feet    Assistive device Straight cane    Gait Pattern Step-through pattern;Decreased arm swing - left;Decreased step length - right;Decreased step length - left;Trunk flexed    Ambulation Surface Level;Indoor      Knee/Hip Exercises: Standing   Heel Raises Both;2 sets;10 reps    Heel Raises Limitations UE support      Knee/Hip Exercises: Seated   Long Arc Quad Strengthening;1 set;15 reps;Limitations    Long Arc Quad Limitations alt. B    Marching Both;1 set;15 reps;Limitations    Marching Limitations alt. B      Ankle Exercises: Standing   Heel Raises Both;10 reps;Other (comment)    Heel Raises Limitations BUE support    Toe Raise 10 reps;Limitations    Toe Raise Limitations BUE support                 Balance Exercises - 10/12/20 0001  Balance Exercises: Standing   Step Ups Forward;Lateral;2 inch;UE support 1;Limitations    Step Ups Limitations 10x per LE ea. direction    Sidestepping Upper extremity support;5 reps;Limitations    Sidestepping Limitations at counter                 PT Short Term Goals - 09/25/20 1734       PT SHORT TERM GOAL #1   Title Patient to become I in initail HEP    Baseline TBD    Time 2    Period Weeks    Status New    Target Date 10/16/20      PT SHORT TERM GOAL #2   Title Patient to demo 4/5 sterngth in [place] 4-/5 B hip and knee flexion    Time 2    Period Weeks    Status New    Target Date 10/16/20      PT SHORT TERM GOAL #3   Title Patient to ambulate 522f with S and SPC    Baseline 1077fwith SBA and SPC    Time 2    Period Weeks    Status New    Target Date 10/16/20               PT Long Term Goals - 09/25/20 1159       PT LONG TERM GOAL #1   Title Patient to demo final HEP back to PT w/o cuing    Baseline TBD    Time 4     Period Weeks    Status New    Target Date 10/30/20      PT LONG TERM GOAL #2   Title Patient to ambulate 10003fith LRAD under S    Baseline 100f8fth SPC and S    Time 4    Period Weeks    Status New    Target Date 10/30/20      PT LONG TERM GOAL #3   Title Patient to negotiate a full flight of stairs with most appropriate pattern    Baseline 4 steps alternating pattern with single rail    Time 4    Period Weeks    Status New    Target Date 10/30/20      PT LONG TERM GOAL #4   Title Patient to increase DGI score to 22    Baseline 18 DGI score    Time 4    Period Weeks    Status New    Target Date 10/30/20                   Plan - 10/12/20 1503     Clinical Impression Statement Todays session focused on establishing a HEP, aerobic conditioning and LE strength and balance training.  Added stepping tasks, provided written HEP, began stepping for strength and balance.    Personal Factors and Comorbidities Age    Examination-Activity Limitations Locomotion Level;Stairs    Stability/Clinical Decision Making Evolving/Moderate complexity    Rehab Potential Good    PT Frequency 2x / week    PT Duration 4 weeks    PT Treatment/Interventions ADLs/Self Care Home Management;Aquatic Therapy;DME Instruction;Gait training;Stair training;Functional mobility training;Therapeutic activities;Therapeutic exercise;Balance training;Neuromuscular re-education;Patient/family education    PT Next Visit Plan establish HEP, initiate gait and balance training    PT [place] Agree with Plan of Care Patient;Family member/caregiver    Family Member Consulted spouse  Patient will benefit from skilled therapeutic intervention in order to improve the following deficits and impairments:  Abnormal gait, Decreased endurance, Increased edema, Decreased activity tolerance, Decreased strength, Decreased balance, Difficulty walking  Visit  Diagnosis: Left middle cerebral artery stroke (HCC)  Unsteadiness on feet  Muscle weakness (generalized)     Problem List Patient Active Problem List   Diagnosis Date Noted   Abnormality of gait 09/10/2020   Leukocytosis    Somnolence    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/2020   Acute right hemiparesis ([place]) 07/19/2020   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   AKI (acute kidney injury) ([place])    Prediabetes    New onset atrial fibrillation ([place])    Dysphagia    Hyperlipidemia 07/16/2020   Type II diabetes mellitus ([place]) 07/16/2020   Atrial fibrillation ([place]) 07/16/2020   Acute CVA (cerebrovascular accident) ([place]) 07/15/2020   Cancer of sigmoid colon ([place]) 12/17/2016   Obesity (BMI 30-39.9) 01/15/2011   Hypertension 01/15/2011   History of gout 01/15/2011   ED (erectile dysfunction) 01/15/2011    Lanice Shirts PT 10/12/2020, 3:30 PM  [facility] [address] [place], [place], [address] Phone: [phone]   Fax:  [phone]  Name: Edwin Wall MRN: [id number] Date of Birth: 03-25-1940

## 2020-10-12 NOTE — Patient Instructions (Signed)
Access Code: [email or web address] URL: [email or web address] Date: 10/12/2020 Prepared by: Sharlynn Oliphant  Exercises Seated Long Arc Quad - 2 x daily - 7 x weekly - 2 sets - 10 reps Seated March - 2 x daily - 7 x weekly - 2 sets - 10 reps Side Stepping with Counter Support - 2 x daily - 7 x weekly - 2 sets - 10 reps Heel Toe Raises with Counter Support - 2 x daily - 7 x weekly - 2 sets - 10 reps Sit to Stand with Armchair - 2 x daily - 7 x weekly - 2 sets - 5 reps

## 2020-10-12 NOTE — Therapy (Signed)
[facility] [address] [place], [place], [address] Phone: [phone]   Fax:  [phone]  Speech Language Pathology Treatment  Patient Details  Name: Edwin Wall MRN: [id number] Date of Birth: September 09, 1940 Referring Provider (SLP): Courtney Heys, MD   Encounter Date: 10/12/2020   End of Session - 10/12/20 2302     Visit Number 3    Number of Visits 25    Date for SLP Re-Evaluation 12/24/20    Activity Tolerance Patient tolerated treatment well             Past Medical History:  Diagnosis Date   Anxiety    Arthritis    Cataract    Colon cancer ([place]) dx'd 11/2016   "stage 3"   Gout    Gout    Hemorrhoids    History of kidney stones    "passed it"   Hypertension    Pre-diabetes     Past Surgical History:  Procedure Laterality Date   COLECTOMY  12/17/2016   lap; partial sigmoid colectomy/notes 12/17/2016   COLONOSCOPY W/ BIOPSIES AND POLYPECTOMY  11/11/2016   LAPAROSCOPIC SIGMOID COLECTOMY [name] 12/17/2016   Procedure: LAPAROSCOPIC SIGMOID COLECTOMY;  Surgeon: [name], MD;  Location: [facility];  Service: General;  Laterality: [name];   NASAL SEPTUM SURGERY     TONSILLECTOMY      There were no vitals filed for this visit.   Subjective Assessment - 10/12/20 1412     Subjective "No, I think her name was sarah." (Katherine's name)                   ADULT SLP TREATMENT - 10/12/20 1427       General Information   Behavior/Cognition Alert;Cooperative;Pleasant mood;Requires cueing      Treatment Provided   Treatment provided Cognitive-Linquistic      Cognitive-Linquistic Treatment   Treatment focused on Aphasia    Skilled Treatment SLP assisted pt with naming tasks - suspect some of pt's deficit is from cognition/organization as he appeared to lack the ability to cognitively generate [id number] items for certain categories. Pt with usual cues for adding more words to his handouts.       Assessment / Recommendations / Plan   Plan Continue with current plan of care      Progression Toward Goals   Progression toward goals Progressing toward goals                SLP Short Term Goals - 10/12/20 1411       SLP SHORT TERM GOAL #1   Title Pt will provide functional 2-3 sentence responses (spoken) to simple stimuli in at least 20 opportunities in 3 sessions    Time 4    Period Weeks    Status On-going    Target Date 10/27/20      SLP SHORT TERM GOAL #2   Title In 5 minutes mod complex conversation pt will demonstrate understanding with modified independence (compensations) x3 sessions    Time 6    Period Weeks    Status On-going    Target Date 11/09/20      SLP SHORT TERM GOAL #3   Title In 5 minutes mod complex conversation pt will demo functional verbal expression with use of language compensations x3 sessions    Time 6    Period Weeks    Status On-going    Target Date 11/09/20      SLP SHORT TERM GOAL #4  Title Pt will demo knowledge of expressive language errors in 5 minutes conversation with SLP nonverbal cues x3 sessions    Time 6    Period Weeks    Status On-going    Target Date 11/09/20      SLP SHORT TERM GOAL #5   Title pt will dmeo understanding of functional written material (to-do lists, simple notes, etc) with extra time, in 3 sessions    Time 4    Period Weeks    Status On-going    Target Date 10/26/20              SLP Long Term Goals - 10/01/20 1748       SLP LONG TERM GOAL #1   Title In 10 minutes mod complex conversation pt will demonstrate understanding with modified independence (compensations) in 3 sessions    Time 8    Period Weeks    Status On-going    Target Date 11/30/20      SLP LONG TERM GOAL #2   Title In 10 minutes mod complex conversation pt will demo functional verbal expression with use of language compensations x3 sessions    Time 8    Period Weeks    Status On-going    Target Date 11/30/20       SLP LONG TERM GOAL #3   Title Pt will demo knowledge of expressive language errors in 10 minutes conversation with SLP/listener nonverbal cues x3 sessions    Time 8    Period Weeks    Status On-going    Target Date 11/30/20      SLP LONG TERM GOAL #4   Title In 15 minutes mod complex conversation pt will demo functional verbal expression with use of language compensations x3 sessions    Time 12    Period Weeks    Status On-going    Target Date 12/26/20      SLP LONG TERM GOAL #5   Title In 10 minutes mod complex conversation pt will demonstrate understanding with modified independence (compensations) in 3 sessions    Time 12    Period Weeks    Status On-going    Target Date 12/26/20               Patient will benefit from skilled therapeutic intervention in order to improve the following deficits and impairments:   Aphasia  Cognitive communication deficit    Problem List Patient Active Problem List   Diagnosis Date Noted   Abnormality of gait 09/10/2020   Leukocytosis    Somnolence    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/2020   Acute right hemiparesis ([place]) 07/19/2020   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 [place], [place], [address] Phone: [phone]   Fax:  [phone]   Name: Edwin Wall MRN: [id number] Date of Birth: December 19, 1940

## 2020-10-15 ENCOUNTER — Ambulatory Visit: Payer: Medicare Other

## 2020-10-15 ENCOUNTER — Other Ambulatory Visit: Payer: Self-pay

## 2020-10-15 DIAGNOSIS — R4701 Aphasia: Secondary | ICD-10-CM

## 2020-10-15 DIAGNOSIS — R41841 Cognitive communication deficit: Secondary | ICD-10-CM

## 2020-10-15 NOTE — Therapy (Signed)
[facility] [address] [place], [place], [address] Phone: [phone]   Fax:  [phone]  Speech Language Pathology Treatment  Patient Details  Name: Edwin Wall MRN: [id number] Date of Birth: Oct 17, 1940 Referring Provider (SLP): Courtney Heys, MD   Encounter Date: 10/15/2020   End of Session - 10/15/20 1407     Visit Number 4    Number of Visits 25    Date for SLP Re-Evaluation 12/24/20    SLP Start Time 1319    SLP Stop Time  1400    SLP Time Calculation (min) 41 min    Activity Tolerance Patient tolerated treatment well             Past Medical History:  Diagnosis Date   Anxiety    Arthritis    Cataract    Colon cancer ([place]) dx'd 11/2016   "stage 3"   Gout    Gout    Hemorrhoids    History of kidney stones    "passed it"   Hypertension    Pre-diabetes     Past Surgical History:  Procedure Laterality Date   COLECTOMY  12/17/2016   lap; partial sigmoid colectomy/notes 12/17/2016   COLONOSCOPY W/ BIOPSIES AND POLYPECTOMY  11/11/2016   LAPAROSCOPIC SIGMOID COLECTOMY [name] 12/17/2016   Procedure: LAPAROSCOPIC SIGMOID COLECTOMY;  Surgeon: [name], MD;  Location: [facility];  Service: General;  Laterality: [name];   NASAL SEPTUM SURGERY     TONSILLECTOMY      There were no vitals filed for this visit.   Subjective Assessment - 10/15/20 1324     Subjective Pt denies that he has any language deficits. Wife states to pt that he did have an incident and pt affirms he is having difficulty "get my words mixed up.".    Patient is accompained by: Family member   wife   Currently in Pain? No/denies                   ADULT SLP TREATMENT - 10/15/20 1328       General Information   Behavior/Cognition Alert;Cooperative;Pleasant mood;Requires cueing      Treatment Provided   Treatment provided Cognitive-Linquistic      Cognitive-Linquistic Treatment   Treatment focused on Aphasia     Skilled Treatment "Since he's had this stroke he doesn't talk as much." SLP targeted pt's following of simple directions - req'd extra time, and max cues consistently for attention to detail in written directions, faded to min-mod cues occasionally for attention to detail. Pt completed task and said, "That sure was hard for me." SLP used this moment to highlight that pt would have completed that task premorbidly with 100% success and in about 2 minutes, instead of limited success requiring assistance from someone else and 15 minutes of time. Pt agreed with SLP.      Assessment / Recommendations / Plan   Plan Continue with current plan of care      Progression Toward Goals   Progression toward goals Progressing toward goals   acknowledged difficulty with written directions today             SLP Education - 10/15/20 1406     Education Details deficit areas    Person(s) Educated Patient;Spouse    Methods Explanation;Demonstration    Comprehension Verbal cues required;Verbalized understanding              SLP Short Term Goals - 10/15/20 1414  SLP SHORT TERM GOAL #1   Title Pt will provide functional 2-3 sentence responses (spoken) to simple stimuli in at least 20 opportunities in 3 sessions    Time 4    Period Weeks    Status On-going    Target Date 10/27/20      SLP SHORT TERM GOAL #2   Title In 5 minutes mod complex conversation pt will demonstrate understanding with modified independence (compensations) x3 sessions    Time 6    Period Weeks    Status On-going    Target Date 11/09/20      SLP SHORT TERM GOAL #3   Title In 5 minutes mod complex conversation pt will demo functional verbal expression with use of language compensations x3 sessions    Time 6    Period Weeks    Status On-going    Target Date 11/09/20      SLP SHORT TERM GOAL #4   Title Pt will demo knowledge of expressive language errors in 5 minutes conversation with SLP nonverbal cues x3 sessions     Time 6    Period Weeks    Status On-going    Target Date 11/09/20      SLP SHORT TERM GOAL #5   Title pt will dmeo understanding of functional written material (to-do lists, simple notes, etc) with extra time, in 3 sessions    Time 4    Period Weeks    Status On-going    Target Date 10/26/20              SLP Long Term Goals - 10/15/20 1414       SLP LONG TERM GOAL #1   Title In 10 minutes mod complex conversation pt will demonstrate understanding with modified independence (compensations) in 3 sessions    Time 8    Period Weeks    Status On-going    Target Date 11/30/20      SLP LONG TERM GOAL #2   Title In 10 minutes mod complex conversation pt will demo functional verbal expression with use of language compensations x3 sessions    Time 8    Period Weeks    Status On-going    Target Date 11/30/20      SLP LONG TERM GOAL #3   Title Pt will demo knowledge of expressive language errors in 10 minutes conversation with SLP/listener nonverbal cues x3 sessions    Time 8    Period Weeks    Status On-going    Target Date 11/30/20      SLP LONG TERM GOAL #4   Title In 15 minutes mod complex conversation pt will demo functional verbal expression with use of language compensations x3 sessions    Time 12    Period Weeks    Status On-going    Target Date 12/26/20      SLP LONG TERM GOAL #5   Title In 10 minutes mod complex conversation pt will demonstrate understanding with modified independence (compensations) in 3 sessions    Time 12    Period Weeks    Status On-going    Target Date 12/26/20              Plan - 10/15/20 1413     Clinical Impression Statement Braulio Conte "Claiborne Billings" 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 He had ST on CIR.Mr. Banner would benefit from skilled ST targeting  improved accuracy of verbal expression, error awareness to  increase his independence with verbal expression, and incr usage of a wider range of compensatory strategies to improve efficiency and flow of verbal expression. Pt states he would like to be more accurate in his speech and wife states she needs to occasionally ask clarifying questions for complete comprehension of pt's message.    Speech Therapy Frequency 2x / week    Duration 12 weeks    Treatment/Interventions Functional tasks;Compensatory techniques;Multimodal communcation approach;SLP instruction and feedback;Cueing hierarchy;Language facilitation;Cognitive reorganization;Patient/family education;Internal/external aids    Potential to Achieve Goals Good    Consulted and Agree with Plan of Care Patient;Family member/caregiver    Family Member Consulted wife             Patient will benefit from skilled therapeutic intervention in order to improve the following deficits and impairments:   Aphasia  Cognitive communication deficit    Problem List Patient Active Problem List   Diagnosis Date Noted   Abnormality of gait 09/10/2020   Leukocytosis    Somnolence    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/2020   Acute right hemiparesis (HCC) 07/19/2020   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 ,MS, [place]  10/15/2020, 2:16 PM  [facility] [address] [place], [place], [address] Phone: [phone]   Fax:  [phone]   Name: FERLIN PETRENKO MRN: [id number] Date of  Birth: 1940/12/10

## 2020-10-15 NOTE — Patient Instructions (Signed)
   Today you saw that even following simple directions can be difficult for you after the stroke. I will give you more homework for following detailed directions.

## 2020-10-19 ENCOUNTER — Ambulatory Visit: Payer: Medicare Other | Admitting: Occupational Therapy

## 2020-10-19 ENCOUNTER — Ambulatory Visit: Payer: Medicare Other

## 2020-10-19 ENCOUNTER — Other Ambulatory Visit: Payer: Self-pay

## 2020-10-19 ENCOUNTER — Encounter: Payer: Self-pay | Admitting: Occupational Therapy

## 2020-10-19 DIAGNOSIS — I63512 Cerebral infarction due to unspecified occlusion or stenosis of left middle cerebral artery: Secondary | ICD-10-CM

## 2020-10-19 DIAGNOSIS — R4701 Aphasia: Secondary | ICD-10-CM | POA: Diagnosis not present

## 2020-10-19 DIAGNOSIS — R41840 Attention and concentration deficit: Secondary | ICD-10-CM

## 2020-10-19 DIAGNOSIS — R41841 Cognitive communication deficit: Secondary | ICD-10-CM

## 2020-10-19 DIAGNOSIS — R2681 Unsteadiness on feet: Secondary | ICD-10-CM

## 2020-10-19 DIAGNOSIS — M6281 Muscle weakness (generalized): Secondary | ICD-10-CM

## 2020-10-19 DIAGNOSIS — R278 Other lack of coordination: Secondary | ICD-10-CM

## 2020-10-19 NOTE — Therapy (Signed)
[facility] [address] [place], [place], [address] Phone: [phone]   Fax:  [phone]  Speech Language Pathology Treatment  Patient Details  Name: Edwin Wall MRN: [id number] Date of Birth: 07-10-1940 Referring Provider (SLP): Courtney Heys, MD   Encounter Date: 10/19/2020   End of Session - 10/19/20 1454     Visit Number 5    Number of Visits 25    Date for SLP Re-Evaluation 12/24/20    SLP Start Time 1404    SLP Stop Time  [id number]    SLP Time Calculation (min) 41 min    Activity Tolerance Patient tolerated treatment well             Past Medical History:  Diagnosis Date   Anxiety    Arthritis    Cataract    Colon cancer ([place]) dx'd 11/2016   "stage 3"   Gout    Gout    Hemorrhoids    History of kidney stones    "passed it"   Hypertension    Pre-diabetes     Past Surgical History:  Procedure Laterality Date   COLECTOMY  12/17/2016   lap; partial sigmoid colectomy/notes 12/17/2016   COLONOSCOPY W/ BIOPSIES AND POLYPECTOMY  11/11/2016   LAPAROSCOPIC SIGMOID COLECTOMY [name] 12/17/2016   Procedure: LAPAROSCOPIC SIGMOID COLECTOMY;  Surgeon: [name], MD;  Location: [facility];  Service: General;  Laterality: [name];   NASAL SEPTUM SURGERY     TONSILLECTOMY      There were no vitals filed for this visit.   Subjective Assessment - 10/19/20 1414     Subjective Pt written sequences - third page pt missed 3/4    Patient is accompained by: --   wife   Currently in Pain? No/denies                   ADULT SLP TREATMENT - 10/19/20 1414       General Information   Behavior/Cognition Alert;Cooperative;Pleasant mood;Requires cueing      Treatment Provided   Treatment provided Cognitive-Linquistic      Cognitive-Linquistic Treatment   Treatment focused on Cognition;Aphasia    Skilled Treatment Given pt's performance SLP suggested pt double-check his answers for the other part of  the homework. Pt did not notice any of his errors in written sequencing task. He noticed errors on written directions 2/5. Pt appeared very surprised at how poorly he performed. SLP reiterated that the CVA affected his ability to think as clearly as he did before the CVA.      Assessment / Recommendations / Plan   Plan Continue with current plan of care      Progression Toward Goals   Progression toward goals Progressing toward goals              SLP Education - 10/19/20 1454     Education Details deficit areas, rationale for deficits (CVA)    Person(s) Educated Patient;Spouse    Methods Explanation    Comprehension Verbalized understanding              SLP Short Term Goals - 10/19/20 1456       SLP SHORT TERM GOAL #1   Title Pt will provide functional 2-3 sentence responses (spoken) to simple stimuli in at least 20 opportunities in 3 sessions    Time 4    Period Weeks    Status On-going    Target Date 10/27/20      SLP SHORT  TERM GOAL #2   Title In 5 minutes mod complex conversation pt will demonstrate understanding with modified independence (compensations) x3 sessions    Time 6    Period Weeks    Status On-going    Target Date 11/09/20      SLP SHORT TERM GOAL #3   Title In 5 minutes mod complex conversation pt will demo functional verbal expression with use of language compensations x3 sessions    Time 6    Period Weeks    Status On-going    Target Date 11/09/20      SLP SHORT TERM GOAL #4   Title Pt will demo knowledge of expressive language errors in 5 minutes conversation with SLP nonverbal cues x3 sessions    Time 6    Period Weeks    Status On-going    Target Date 11/09/20      SLP SHORT TERM GOAL #5   Title pt will dmeo understanding of functional written material (to-do lists, simple notes, etc) with extra time, in 3 sessions    Time 4    Period Weeks    Status On-going    Target Date 10/26/20              SLP Long Term Goals -  10/19/20 1456       SLP LONG TERM GOAL #1   Title In 10 minutes mod complex conversation pt will demonstrate understanding with modified independence (compensations) in 3 sessions    Time 8    Period Weeks    Status On-going      SLP LONG TERM GOAL #2   Title In 10 minutes mod complex conversation pt will demo functional verbal expression with use of language compensations x3 sessions    Time 8    Period Weeks    Status On-going      SLP LONG TERM GOAL #3   Title Pt will demo knowledge of expressive language errors in 10 minutes conversation with SLP/listener nonverbal cues x3 sessions    Time 8    Period Weeks    Status On-going      SLP LONG TERM GOAL #4   Title In 15 minutes mod complex conversation pt will demo functional verbal expression with use of language compensations x3 sessions    Time 12    Period Weeks    Status On-going      SLP LONG TERM GOAL #5   Title In 10 minutes mod complex conversation pt will demonstrate understanding with modified independence (compensations) in 3 sessions    Time 12    Period Weeks    Status On-going              Plan - 10/19/20 1454     Clinical Impression Statement Smauel "Claiborne Billings" 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 He had ST on CIR. Today, pt showed some evidence of intellectual awareness for the first time during this therapy course. Mr. Kemna would benefit from skilled ST targeting improved accuracy of verbal expression, error awareness to increase his independence with verbal expression, and incr usage of a wider range of compensatory strategies to improve efficiency and flow of verbal expression. Pt states he would like to be more accurate in his speech and wife states she needs to occasionally ask clarifying questions for complete comprehension of pt's message.    Speech Therapy Frequency 2x /  week     Duration 12 weeks    Treatment/Interventions Functional tasks;Compensatory techniques;Multimodal communcation approach;SLP instruction and feedback;Cueing hierarchy;Language facilitation;Cognitive reorganization;Patient/family education;Internal/external aids    Potential to Achieve Goals Good    Consulted and Agree with Plan of Care Patient;Family member/caregiver    Family Member Consulted wife             Patient will benefit from skilled therapeutic intervention in order to improve the following deficits and impairments:   Cognitive communication deficit  Aphasia    Problem List Patient Active Problem List   Diagnosis Date Noted   Abnormality of gait 09/10/2020   Leukocytosis    Somnolence    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/2020   Acute right hemiparesis ([facility]) 07/19/2020   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 [place], [place], [address] Phone: [phone]   Fax:  [phone]   Name: BISMARK STEVESON MRN: [id number] Date of Birth: [birth year]

## 2020-10-19 NOTE — Therapy (Signed)
[facility] [address] [place], [place], [address] Phone: [phone]   Fax:  [phone]  Occupational Therapy Evaluation  Patient Details  Name: Edwin Wall MRN: [id number] Date of Birth: 03/25/1940 Referring Provider (OT): Courtney Heys, MD   Encounter Date: 10/19/2020   OT End of Session - 10/19/20 0935     Visit Number 1    Number of Visits 9    Date for OT Re-Evaluation 11/30/20   8 visits over 6 weeks d/t any scheduling conflicts   Authorization Type Medicare and AARP supplement    Authorization Time Period Follow Medicare Guidelines    Progress Note Due on Visit 10    OT Start Time 1450    OT Stop Time 1530    OT Time Calculation (min) 40 min    Activity Tolerance Patient tolerated treatment well    Behavior During Therapy [facility] for tasks assessed/performed              Past Medical History:  Diagnosis Date   Anxiety    Arthritis    Cataract    Colon cancer ([facility]) dx'd 11/2016   "stage 3"   Gout    Gout    Hemorrhoids    History of kidney stones    "passed it"   Hypertension    Pre-diabetes     Past Surgical History:  Procedure Laterality Date   COLECTOMY  12/17/2016   lap; partial sigmoid colectomy/notes 12/17/2016   COLONOSCOPY W/ BIOPSIES AND POLYPECTOMY  11/11/2016   LAPAROSCOPIC SIGMOID COLECTOMY [name] 12/17/2016   Procedure: LAPAROSCOPIC SIGMOID COLECTOMY;  Surgeon: [name], MD;  Location: [facility];  Service: General;  Laterality: [name];   NASAL SEPTUM SURGERY     TONSILLECTOMY      There were no vitals filed for this visit.        [facility] OT Assessment - 10/19/20 0932       Assessment   Medical Diagnosis L CVA    Referring Provider (OT) Courtney Heys, MD    Onset Date/Surgical Date 07/15/20    Hand Dominance Left    Prior Therapy CIR 07/15/20-08/03/20      Precautions   Precautions Fall      Restrictions   Weight Bearing Restrictions No      Balance Screen    Has the patient fallen in the past 6 months Yes    How many times? before the stroke      Home  Environment   Family/patient expects to be discharged to: Private residence    Living Arrangements Spouse/significant other    Type of [place] to live on main level with bedroom/bathroom    Bathroom Lobbyist seat      Prior Function   Level of Independence Independent    Vocation Retired    Leisure loves computer and watch ball games      ADL   ADL comments Pt and patient's spouse report patient is completing all bathing and dressing independently.      IADL   Shopping Needs to be accompanied on any shopping trip    [place] alone or with occasional assistance    Meal Prep Able to complete simple warm meal prep    Community Mobility Relies on family or friends for transportation    Medication Management Has difficulty remembering to take medication    Financial Management  Requires assistance      Vision - History   Baseline Vision Wears glasses for distance only      Vision Assessment   Comment Pt reports no visual changes      Observation/Other Assessments   Focus on Therapeutic Outcomes (FOTO)  was not completed at evaluation by front desk      Coordination   9 Hole Peg Test Right;Left    Right 9 Hole Peg Test 43.98s    Left 9 Hole Peg Test 37.25s      AROM   Overall AROM  Within functional limits for tasks performed      Strength   Overall Strength Within functional limits for tasks performed      Hand Function   Right Hand Gross Grasp Functional    Right Hand Grip (lbs) 37.6    Left Hand Gross Grasp Functional    Left Hand Grip (lbs) 51.5                                OT Short Term Goals - 10/22/20 0933       OT SHORT TERM GOAL #1   Title Pt will be independent with HEP for RUE grip strength and coordination    Time 3    Period Weeks    Status  New    Target Date 11/09/20      OT SHORT TERM GOAL #2   Title Pt will perform environmental scanning with 90% accuracy or greater.    Time 3    Period Weeks    Status New               OT Long Term Goals - 10/22/20 0933       OT LONG TERM GOAL #1   Title Pt will be independent with updated HEPs    Time 6    Period Weeks    Status New      OT LONG TERM GOAL #2   Title Pt will improve grip strength in RUE by 5 lbs or greater for increased use of nondominant hand.    Baseline L 51.5 R 37.6    Time 6    Period Weeks    Status New      OT LONG TERM GOAL #3   Title Pt will increase fine motor coordination in RUE by completing 9 hole peg test in 37 seconds or less    Baseline R 43.98s, L 37.25s    Time 6    Period Weeks    Status New      OT LONG TERM GOAL #4   Title Pt will perform physical and cognitive task simultaneously with 90% accuracy or greater.    Time 6    Period Weeks    Status New                   Plan - 10/22/20 0935     Clinical Impression Statement Pt is a 80 year old male that presents to Neuro OPOT s/p L MCA stroke with R hemiparesis on 07/15/20. Pt with PMH significant for DM, HTN, HLD, and CKD Stage IIIB, hx of colon CA. Pt presents with deficits with alternating attention, decreased grip strength and coordination in RUE. Skilled occupational therapy is recommended to target listed areas of deficit and increase overall coordination, grip strength and attention skills.    OT Occupational Profile and History Problem Focused  Assessment - Including review of records relating to presenting problem    Occupational performance deficits (Please refer to evaluation for details): IADL's;Leisure    Body Structure / Function / Physical Skills ADL;FMC;Coordination;ROM;IADL;UE functional use;Dexterity;GMC;Strength    Cognitive Skills Attention;Safety Awareness    Rehab Potential Good    Clinical Decision Making Limited treatment options, no task  modification necessary    Comorbidities Affecting Occupational Performance: None    Modification or Assistance to Complete Evaluation  No modification of tasks or assist necessary to complete eval    OT Frequency 2x / week    OT Duration 6 weeks   8 visits over 6 weeks to allow for scheduling conflicts   OT Treatment/Interventions Self-care/ADL training;DME and/or AE instruction;Functional Mobility Training;Patient/family education;Energy conservation;Therapeutic exercise;Cognitive remediation/compensation;Therapeutic activities;Neuromuscular education;Manual Therapy    Plan coordination HEP, theraputty HEP for RUE, alternating attention, environmental scanning    Consulted and Agree with Plan of Care Patient;Family member/caregiver    Family Member Consulted spouse, [name]              Patient will benefit from skilled therapeutic intervention in order to improve the following deficits and impairments:   Body Structure / Function / Physical Skills: ADL, FMC, Coordination, ROM, IADL, UE functional use, Dexterity, GMC, Strength Cognitive Skills: Attention, Safety Awareness     Visit Diagnosis: Unsteadiness on feet  Attention and concentration deficit  Muscle weakness (generalized)  Other lack of coordination    Problem List Patient Active Problem List   Diagnosis Date Noted   Abnormality of gait 09/10/2020   Leukocytosis    Somnolence    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/2020   Acute right hemiparesis ([facility]) 07/19/2020   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 [place], [place], [address] Phone: [phone]   Fax:  [phone]  Name: CHRISTORPHER WATLEY MRN: [id number] Date of Birth: 01/07/1941

## 2020-10-19 NOTE — Patient Instructions (Signed)
  Please complete the assigned speech therapy homework prior to your next session and return it to the speech therapist at your next visit.  

## 2020-10-19 NOTE — Therapy (Signed)
[facility] [address] [place], [place], [address] Phone: [phone]   Fax:  [phone]  Physical Therapy Treatment  Patient Details  Name: Edwin Wall MRN: [id number] Date of Birth: 06-11-1940 Referring Provider (PT): Dr. Courtney Heys   Encounter Date: 10/19/2020   PT End of Session - 10/19/20 1322     Visit Number 3    Number of Visits 9    Date for PT Re-Evaluation 10/30/20    Authorization Type MCRAARP    Progress Note Due on Visit 9    PT Start Time 1320    PT Stop Time 1400    PT Time Calculation (min) 40 min    Equipment Utilized During Treatment Gait belt    Activity Tolerance Patient tolerated treatment well    Behavior During Therapy [facility] for tasks assessed/performed             Past Medical History:  Diagnosis Date   Anxiety    Arthritis    Cataract    Colon cancer ([facility]) dx'd 11/2016   "stage 3"   Gout    Gout    Hemorrhoids    History of kidney stones    "passed it"   Hypertension    Pre-diabetes     Past Surgical History:  Procedure Laterality Date   COLECTOMY  12/17/2016   lap; partial sigmoid colectomy/notes 12/17/2016   COLONOSCOPY W/ BIOPSIES AND POLYPECTOMY  11/11/2016   LAPAROSCOPIC SIGMOID COLECTOMY [name] 12/17/2016   Procedure: LAPAROSCOPIC SIGMOID COLECTOMY;  Surgeon: [name], MD;  Location: [facility];  Service: General;  Laterality: [name];   NASAL SEPTUM SURGERY     TONSILLECTOMY      There were no vitals filed for this visit.   Subjective Assessment - 10/19/20 1321     Subjective No falls, LOB or med changes.  Saw MD , blood work fine.    Patient is accompained by: Family member                               [facility] Adult PT Treatment/Exercise - 10/19/20 0001       Transfers   Transfers Sit to Stand    Sit to Stand 4: Min guard    Comments 5x STS w/arms crossed requiring MinA, instructed to stand with UE support but sit with arms  crossed      Ambulation/Gait   Ambulation/Gait Yes    Ambulation/Gait Assistance 5: Supervision    Ambulation Distance (Feet) 100 Feet    Assistive device None    Gait Pattern Step-through pattern;Decreased arm swing - left;Decreased step length - right;Decreased step length - left;Trunk flexed    Ambulation Surface Level;Indoor    Gait Comments in clinic      Knee/Hip Exercises: Aerobic   Other Aerobic Scifit L1 seat 20 arms 7 8'      Knee/Hip Exercises: Standing   Heel Raises Both;2 sets;10 reps    Heel Raises Limitations UE support      Knee/Hip Exercises: Seated   Long Arc Quad Strengthening;1 set;Limitations;10 reps    Long Arc Quad Limitations alt. B    Marching Both;1 set;Limitations;10 reps    Marching Limitations alt. B      Ankle Exercises: Standing   Heel Raises Both;10 reps;Other (comment)    Heel Raises Limitations BUE support    Toe Raise 10 reps;Limitations    Toe Raise Limitations BUE support  Balance Exercises - 10/19/20 0001       Balance Exercises: Standing   SLS with Vectors Solid surface;Limitations    SLS with Vectors Limitations standing w/o UE support tapping 2 targets nilatera, 2 targets alt. 2 targets alt cross body, 1 rarget alt. cross body.    Step Ups Forward;Lateral;Limitations;4 inch    Step Ups Limitations 10x per LE ea. direction, from airex    Sidestepping 2 reps;Limitations    Sidestepping Limitations HEP review                 PT Short Term Goals - 10/19/20 1546       PT SHORT TERM GOAL #1   Title Patient to become I in [place] TBD; 10/19/20 Able to perform HEP w/o cuing    Time 2    Period Weeks    Status New    Target Date 10/16/20      PT SHORT TERM GOAL #2   Title Patient to demo 4/5 sterngth in [place] 4-/5 B hip and knee flexion    Time 2    Period Weeks    Status New    Target Date 10/16/20      PT SHORT TERM GOAL #3   Title Patient to ambulate 571f with S  and SPC    Baseline 1059fwith SBA and SPC    Time 2    Period Weeks    Status New    Target Date 10/16/20               PT Long Term Goals - 09/25/20 1159       PT LONG TERM GOAL #1   Title Patient to demo final HEP back to PT w/o cuing    Baseline TBD    Time 4    Period Weeks    Status New    Target Date 10/30/20      PT LONG TERM GOAL #2   Title Patient to ambulate 100040fith LRAD under S    Baseline 100f19fth SPC and S    Time 4    Period Weeks    Status New    Target Date 10/30/20      PT LONG TERM GOAL #3   Title Patient to negotiate a full flight of stairs with most appropriate pattern    Baseline 4 steps alternating pattern with single rail    Time 4    Period Weeks    Status New    Target Date 10/30/20      PT LONG TERM GOAL #4   Title Patient to increase DGI score to 22    Baseline 18 DGI score    Time 4    Period Weeks    Status New    Target Date 10/30/20                   Plan - 10/19/20 1542     Clinical Impression Statement Todays session focused on strength and balance training, began stepper for aerobic component, reviwed HEP with patient demo and began balance training with stepping tasks, SLS and floor targets, static and dynamic balance tasks on rockerboard.  Patient able to perform all low level tsks w/o LOB, some foot placement issues noted with fatigue as well as relutance to weight bear posteriorly.    Personal Factors and Comorbidities Age    Examination-Activity Limitations Locomotion Level;Stairs    Stability/Clinical Decision  Making Evolving/Moderate complexity    Rehab Potential Good    PT Frequency 2x / week    PT Duration 4 weeks    PT Treatment/Interventions ADLs/Self Care Home Management;Aquatic Therapy;DME Instruction;Gait training;Stair training;Functional mobility training;Therapeutic activities;Therapeutic exercise;Balance training;Neuromuscular re-education;Patient/family education    PT Next Visit Plan  gait and balance training. LE strengthening and aerobic work    PT [place] and Agree with Plan of Care Patient;Family member/caregiver    Family Member Consulted spouse             Patient will benefit from skilled therapeutic intervention in order to improve the following deficits and impairments:  Abnormal gait, Decreased endurance, Increased edema, Decreased activity tolerance, Decreased strength, Decreased balance, Difficulty walking  Visit Diagnosis: Unsteadiness on feet  Muscle weakness (generalized)  Left middle cerebral artery stroke [facility])     Problem List Patient Active Problem List   Diagnosis Date Noted   Abnormality of gait 09/10/2020   Leukocytosis    Somnolence    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/2020   Acute right hemiparesis ([place]) 07/19/2020   Left middle cerebral artery stroke ([place]) 07/18/2020   AKI (acute kidney injury) ([place])    Prediabetes    New onset atrial fibrillation ([place])    Dysphagia    Hyperlipidemia 07/16/2020   Type II diabetes mellitus ([place]) 07/16/2020   Atrial fibrillation ([place]) 07/16/2020   Acute CVA (cerebrovascular accident) ([place]) 07/15/2020   Cancer of sigmoid colon ([place]) 12/17/2016   Obesity (BMI 30-39.9) 01/15/2011   Hypertension 01/15/2011   History of gout 01/15/2011   ED (erectile dysfunction) 01/15/2011    Lanice Shirts PT 10/19/2020, 3:53 PM  [facility] [address] [place], [place], [address] Phone: [phone]   Fax:  [phone]  Name: LILE GO MRN: [id number] Date of Birth: [birth year]

## 2020-10-23 ENCOUNTER — Encounter: Payer: Self-pay | Admitting: Occupational Therapy

## 2020-10-23 ENCOUNTER — Ambulatory Visit: Payer: Medicare Other

## 2020-10-23 ENCOUNTER — Ambulatory Visit: Payer: Medicare Other | Admitting: Occupational Therapy

## 2020-10-23 ENCOUNTER — Other Ambulatory Visit: Payer: Self-pay

## 2020-10-23 VITALS — BP 132/83 | HR 89

## 2020-10-23 DIAGNOSIS — M6281 Muscle weakness (generalized): Secondary | ICD-10-CM

## 2020-10-23 DIAGNOSIS — R2681 Unsteadiness on feet: Secondary | ICD-10-CM

## 2020-10-23 DIAGNOSIS — R41841 Cognitive communication deficit: Secondary | ICD-10-CM

## 2020-10-23 DIAGNOSIS — R4701 Aphasia: Secondary | ICD-10-CM | POA: Diagnosis not present

## 2020-10-23 DIAGNOSIS — R278 Other lack of coordination: Secondary | ICD-10-CM

## 2020-10-23 DIAGNOSIS — R41840 Attention and concentration deficit: Secondary | ICD-10-CM

## 2020-10-23 NOTE — Patient Instructions (Signed)
11. Grip Strengthening (Resistive Putty)   Squeeze putty using thumb and all fingers. Repeat 15 times. Do 1-2 sessions per day.   Extension (Assistive Putty)   Roll putty back and forth, being sure to use all fingertips. Repeat 3 times. Do 1-2 sessions per day.  Then pinch as below.   Palmar Pinch Strengthening (Resistive Putty)   Pinch putty between thumb and each fingertip in turn after rolling out    Coordination Activities  Perform the following activities for 5-10 minutes 1 times per day with right hand(s).  Deal cards with your thumb (Hold deck in hand and push card off top with thumb). Shuffle cards. Pick up coins one at a time until you get 5-10 in your hand, then move coins from palm to fingertips to stack one at a time. Rotate ball in hand.

## 2020-10-23 NOTE — Therapy (Signed)
[facility] [address] [place], [place], [address] Phone: [phone]   Fax:  [phone]  Speech Language Pathology Treatment  Patient Details  Name: Edwin Wall MRN: [id number] Date of Birth: Jul 27, 1940 Referring Provider (SLP): Courtney Heys, MD   Encounter Date: 10/23/2020   End of Session - 10/23/20 1633     Visit Number 6    Number of Visits 25    Date for SLP Re-Evaluation 12/24/20    SLP Start Time 85    SLP Stop Time  [id number]    SLP Time Calculation (min) 45 min    Activity Tolerance Patient tolerated treatment well             Past Medical History:  Diagnosis Date   Anxiety    Arthritis    Cataract    Colon cancer ([facility]) dx'd 11/2016   "stage 3"   Gout    Gout    Hemorrhoids    History of kidney stones    "passed it"   Hypertension    Pre-diabetes     Past Surgical History:  Procedure Laterality Date   COLECTOMY  12/17/2016   lap; partial sigmoid colectomy/notes 12/17/2016   COLONOSCOPY W/ BIOPSIES AND POLYPECTOMY  11/11/2016   LAPAROSCOPIC SIGMOID COLECTOMY [name] 12/17/2016   Procedure: LAPAROSCOPIC SIGMOID COLECTOMY;  Surgeon: [name], MD;  Location: [facility];  Service: General;  Laterality: [name];   NASAL SEPTUM SURGERY     TONSILLECTOMY      There were no vitals filed for this visit.   Subjective Assessment - 10/23/20 1229     Subjective "good"    Currently in Pain? No/denies                   ADULT SLP TREATMENT - 10/23/20 1227       General Information   Behavior/Cognition Alert;Cooperative;Pleasant mood;Requires cueing      Treatment Provided   Treatment provided Cognitive-Linquistic      Cognitive-Linquistic Treatment   Treatment focused on Cognition;Aphasia    Skilled Treatment SLP reviewed HEP, in which errors noted on written direction task and 4-step sequencing task. Min to mod A required to ID errors. Occasional mod A and SLP  clarification  required due to reduced comprehension exhibited (i.e., "occupation"). SLP recommended patient double check remaining sequences as part of HEP. Pt noted with overt word finding in conversation, with usual reliance on wife who provided words. SLP educated pt and wife on providing cues to aid anomia versus providing word. Pt's wife verbalized understanding and able to demonstrate cueing given SLP prompting during remainder of session. SLP targeted divergent naming of 3-5 items with category prompt, with usual extended time and intermittent prompting required. Some semantic paraphasias and inaccurate responses noted (ex: potato salad as a fruit). SLP provided HEP for naming as pt expressed "it doesn't come around to me" which results in frustration.      Assessment / Recommendations / Plan   Plan Continue with current plan of care      Progression Toward Goals   Progression toward goals Progressing toward goals              SLP Education - 10/23/20 1633     Education Details naming HEP, cueing hierarchy    Person(s) Educated Patient;Spouse    Methods Explanation;Demonstration;Handout    Comprehension Verbalized understanding;Returned demonstration;Need further instruction              SLP Short Term  Goals - 10/23/20 1228       SLP SHORT TERM GOAL #1   Title Pt will provide functional 2-3 sentence responses (spoken) to simple stimuli in at least 20 opportunities in 3 sessions    Time 4    Period Weeks    Status On-going    Target Date 10/27/20      SLP SHORT TERM GOAL #2   Title In 5 minutes mod complex conversation pt will demonstrate understanding with modified independence (compensations) x3 sessions    Time 6    Period Weeks    Status On-going    Target Date 11/09/20      SLP SHORT TERM GOAL #3   Title In 5 minutes mod complex conversation pt will demo functional verbal expression with use of language compensations x3 sessions    Time 6    Period Weeks    Status  On-going    Target Date 11/09/20      SLP SHORT TERM GOAL #4   Title Pt will demo knowledge of expressive language errors in 5 minutes conversation with SLP nonverbal cues x3 sessions    Time 6    Period Weeks    Status On-going    Target Date 11/09/20      SLP SHORT TERM GOAL #5   Title pt will dmeo understanding of functional written material (to-do lists, simple notes, etc) with extra time, in 3 sessions    Time 4    Period Weeks    Status On-going    Target Date 10/26/20              SLP Long Term Goals - 10/23/20 1228       SLP LONG TERM GOAL #1   Title In 10 minutes mod complex conversation pt will demonstrate understanding with modified independence (compensations) in 3 sessions    Time 8    Period Weeks    Status On-going    Target Date 11/30/20      SLP LONG TERM GOAL #2   Title In 10 minutes mod complex conversation pt will demo functional verbal expression with use of language compensations x3 sessions    Time 8    Period Weeks    Status On-going    Target Date 11/30/20      SLP LONG TERM GOAL #3   Title Pt will demo knowledge of expressive language errors in 10 minutes conversation with SLP/listener nonverbal cues x3 sessions    Time 8    Period Weeks    Status On-going    Target Date 11/30/20      SLP LONG TERM GOAL #4   Title In 15 minutes mod complex conversation pt will demo functional verbal expression with use of language compensations x3 sessions    Time 12    Period Weeks    Status On-going    Target Date 12/26/20      SLP LONG TERM GOAL #5   Title In 10 minutes mod complex conversation pt will demonstrate understanding with modified independence (compensations) in 3 sessions    Time 12    Period Weeks    Status On-going    Target Date 12/26/20              Plan - 10/23/20 1228     Clinical Impression Statement Maddin "Claiborne Billings" presents today with 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 Today,  pt showed some evidence of intellectual awareness of anomia, paraphasias, and errors on structured tasks during this therapy course. Mr. Hillenbrand would benefit from skilled ST targeting improved accuracy of verbal expression, error awareness to increase his independence with verbal expression, and incr usage of a wider range of compensatory strategies to improve efficiency and flow of verbal expression. Pt states he would like to be more accurate in his speech and wife states she needs to occasionally ask clarifying questions for complete comprehension of pt's message.    Speech Therapy Frequency 2x / week    Duration 12 weeks    Treatment/Interventions Functional tasks;Compensatory techniques;Multimodal communcation approach;SLP instruction and feedback;Cueing hierarchy;Language facilitation;Cognitive reorganization;Patient/family education;Internal/external aids    Potential to Achieve Goals Good    Consulted and Agree with Plan of Care Patient;Family member/caregiver    Family Member Consulted wife             Patient will benefit from skilled therapeutic intervention in order to improve the following deficits and impairments:   Aphasia  Cognitive communication deficit    Problem List Patient Active Problem List   Diagnosis Date Noted   Abnormality of gait 09/10/2020   Leukocytosis    Somnolence    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/2020   Acute right hemiparesis (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   AKI (acute kidney injury) ([facility])    Prediabetes    New onset atrial fibrillation ([facility])    Dysphagia    Hyperlipidemia 07/16/2020   Type II diabetes mellitus ([facility]) 07/16/2020   Atrial fibrillation ([facility]) 07/16/2020   Acute CVA (cerebrovascular accident) ([facility]) 07/15/2020   Cancer of sigmoid colon ([facility]) 12/17/2016   Obesity (BMI 30-39.9) 01/15/2011   Hypertension 01/15/2011   History of gout 01/15/2011   ED  (erectile dysfunction) 01/15/2011    Alinda Deem, MA CCC-SLP 10/23/2020, 4:35 PM  [facility] [address] [place], [place], [address] Phone: [phone]   Fax:  [phone]   Name: ACEON SPEAR MRN: [id number] Date of Birth: 1940-08-16

## 2020-10-23 NOTE — Therapy (Signed)
[facility] [address] [place], [place], [address] Phone: [phone]   Fax:  [phone]  Physical Therapy Treatment  Patient Details  Name: Edwin Wall MRN: [id number] Date of Birth: 1940-11-06 Referring Provider (PT): Dr. Courtney Heys   Encounter Date: 10/23/2020   PT End of Session - 10/23/20 1340     Visit Number 4    Number of Visits 9    Date for PT Re-Evaluation 10/30/20    Authorization Type MCRAARP    Progress Note Due on Visit 9    PT Start Time 1315    PT Stop Time 1400    PT Time Calculation (min) 45 min    Equipment Utilized During Treatment Gait belt    Activity Tolerance Patient tolerated treatment well    Behavior During Therapy [facility] for tasks assessed/performed             Past Medical History:  Diagnosis Date   Anxiety    Arthritis    Cataract    Colon cancer ([facility]) dx'd 11/2016   "stage 3"   Gout    Gout    Hemorrhoids    History of kidney stones    "passed it"   Hypertension    Pre-diabetes     Past Surgical History:  Procedure Laterality Date   COLECTOMY  12/17/2016   lap; partial sigmoid colectomy/notes 12/17/2016   COLONOSCOPY W/ BIOPSIES AND POLYPECTOMY  11/11/2016   LAPAROSCOPIC SIGMOID COLECTOMY [name] 12/17/2016   Procedure: LAPAROSCOPIC SIGMOID COLECTOMY;  Surgeon: [name], MD;  Location: [name];  Service: General;  Laterality: [name];   NASAL SEPTUM SURGERY     TONSILLECTOMY      There were no vitals filed for this visit.   Subjective Assessment - 10/23/20 1321     Subjective Became ill this AM after he swallowed all his meds at one time.    Patient is accompained by: Family member    Pertinent History 80 yo male presents to the ED on 5/25 with speech difficutlies and facial droop. 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 PMH including anxiety, arthritis, colon  cancer (dx 2018), HTN, obesity, and pre-daibetes.                               [facility] Adult PT Treatment/Exercise - 10/23/20 0001       Transfers   Transfers Sit to Stand    Sit to Stand 4: Min guard    Comments 5x with arms crossed out of 10 with no UE support      Ambulation/Gait   Ambulation/Gait Yes    Ambulation/Gait Assistance 5: Supervision    Ambulation/Gait Assistance Details lack of TKE B    Ambulation Distance (Feet) 500 Feet    Assistive device None    Gait Pattern Step-through pattern;Decreased arm swing - left;Decreased step length - right;Decreased step length - left;Trunk flexed    Ambulation Surface Level;Outdoor;Paved      Knee/Hip Exercises: Aerobic   Other Aerobic Scifit L2 seat 20 arms 7 8'      Knee/Hip Exercises: Standing   Heel Raises Both;1 set;15 reps    Heel Raises Limitations UE support      Knee/Hip Exercises: Seated   Long Arc Quad Strengthening;1 set;Limitations;15 reps    Long Arc Quad Weight 2 lbs.    Long [employer] Limitations  alt with lat press    Marching Both;1 set;Limitations;15 reps    Marching Limitations alt with lat press    Marching Weights 2 lbs.      Ankle Exercises: Standing   Heel Raises Both;Other (comment);15 reps    Heel Raises Limitations BUE support    Toe Raise 15 reps    Toe Raise Limitations BUE support                 Balance Exercises - 10/23/20 0001       Balance Exercises: Standing   Standing Eyes Opened Narrow base of support (BOS);Head turns;Foam/compliant surface;5 reps;30 secs;Limitations    Standing Eyes Opened Limitations 30s hold, 5 head turns/nods    Standing Eyes Closed Narrow base of support (BOS);Head turns;Foam/compliant surface;5 reps;30 secs;Limitations    Standing Eyes Closed Limitations 30s hold, 5 head turns/nods    SLS with Vectors Solid surface;Limitations    SLS with Vectors Limitations standing w/o UE support tapping 2 targets unilatera, 2 targets alt. 2  targets alt cross body, 1 rarget alt. cross body. Cones used.    Tandem Gait Forward;Retro;Upper extremity support;3 reps;Limitations    Tandem Gait Limitations at counter    Sidestepping Limitations;3 reps    Sidestepping Limitations at counter                 PT Short Term Goals - 10/23/20 1414       PT [place] #1   Title Patient to become I in initail HEP    Baseline TBD; 10/19/20 Able to perform HEP w/o cuing    Time 2    Period Weeks    Status Achieved    Target Date 10/16/20      PT SHORT TERM GOAL #2   Title Patient to demo 4/5 sterngth in BLEs    Baseline 4-/5 B hip and knee flexion    Time 2    Period Weeks    Status On-going    Target Date 10/16/20      PT SHORT TERM GOAL #3   Title Patient to ambulate 544f with S and SPC    Baseline 1044fwith SBA and SPC; 10/23/20 50059fmbulaiton with SBA, no AD    Time 2    Period Weeks    Status Achieved    Target Date 10/16/20               PT Long Term Goals - 09/25/20 1159       PT LONG TERM GOAL #1   Title Patient to demo final HEP back to PT w/o cuing    Baseline TBD    Time 4    Period Weeks    Status New    Target Date 10/30/20      PT LONG TERM GOAL #2   Title Patient to ambulate 1000f41fth LRAD under S    Baseline 100ft48fh SPC and S    Time 4    Period Weeks    Status New    Target Date 10/30/20      PT LONG TERM GOAL #3   Title Patient to negotiate a full flight of stairs with most appropriate pattern    Baseline 4 steps alternating pattern with single rail    Time 4    Period Weeks    Status New    Target Date 10/30/20      PT LONG TERM GOAL #4   Title Patient to increase DGI score  to 22    Baseline 18 DGI score    Time 4    Period Weeks    Status New    Target Date 10/30/20                   Plan - 10/23/20 1348     Clinical Impression Statement Todays session continued to focus on aerobic training, strength and balance tasks.  Increased resistance on  Scifit, outside ambulation introduced.  Ambulates with flexed knees due to PF weakness.  Tendency to drift R observed during ambulation but able to self correct.    Personal Factors and Comorbidities Age    Examination-Activity Limitations Locomotion Level;Stairs    Stability/Clinical Decision Making Evolving/Moderate complexity    Rehab Potential Good    PT Frequency 2x / week    PT Duration 4 weeks    PT Treatment/Interventions ADLs/Self Care Home Management;Aquatic Therapy;DME Instruction;Gait training;Stair training;Functional mobility training;Therapeutic activities;Therapeutic exercise;Balance training;Neuromuscular re-education;Patient/family education    PT Next Visit Plan gait and balance training. LE strengthening and aerobic work, EC tasks for balance, compliant surface training    PT [place] and Agree with Plan of Care Patient;Family member/caregiver    Family Member Consulted spouse             Patient will benefit from skilled therapeutic intervention in order to improve the following deficits and impairments:  Abnormal gait, Decreased endurance, Increased edema, Decreased activity tolerance, Decreased strength, Decreased balance, Difficulty walking  Visit Diagnosis: Unsteadiness on feet  Muscle weakness (generalized)  Other lack of coordination     Problem List Patient Active Problem List   Diagnosis Date Noted   Abnormality of gait 09/10/2020   Leukocytosis    Somnolence    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/2020   Acute right hemiparesis ([facility]) 07/19/2020   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 [place], [place], [address] Phone: [phone]   Fax:  [phone]  Name: Edwin Wall MRN: [id number] Date of Birth: 1941-02-19

## 2020-10-23 NOTE — Therapy (Signed)
[facility] [address] [place], [place], [address] Phone: [phone]   Fax:  [phone]  Occupational Therapy Treatment  Patient Details  Name: Edwin Wall MRN: [id number] Date of Birth: [birth year] Referring Provider (OT): Courtney Heys, MD   Encounter Date: 10/23/2020   OT End of Session - 10/23/20 1528     Visit Number 2    Number of Visits 9    Date for OT Re-Evaluation 11/30/20    Authorization Type Medicare and AARP supplement    Authorization Time Period Follow Medicare Guidelines    Progress Note Due on Visit 10    OT Start Time 1145    OT Stop Time 1230    OT Time Calculation (min) 45 min    Activity Tolerance Patient tolerated treatment well    Behavior During Therapy [facility] for tasks assessed/performed             Past Medical History:  Diagnosis Date   Anxiety    Arthritis    Cataract    Colon cancer ([facility]) dx'd 11/2016   "stage 3"   Gout    Gout    Hemorrhoids    History of kidney stones    "passed it"   Hypertension    Pre-diabetes     Past Surgical History:  Procedure Laterality Date   COLECTOMY  12/17/2016   lap; partial sigmoid colectomy/notes 12/17/2016   COLONOSCOPY W/ BIOPSIES AND POLYPECTOMY  11/11/2016   LAPAROSCOPIC SIGMOID COLECTOMY [name] 12/17/2016   Procedure: LAPAROSCOPIC SIGMOID COLECTOMY;  Surgeon: [name], MD;  Location: [facility];  Service: General;  Laterality: [name];   NASAL SEPTUM SURGERY     TONSILLECTOMY      Vitals:   10/23/20 1202  BP: 132/83  Pulse: 89     Subjective Assessment - 10/23/20 1523     Subjective  Patient unable to indicate any limitations from his stroke. Patient with bruising on right hand and left elbow - with little awareness of how they occurred.    Patient is accompanied by: Family member    Pertinent History PMH significant for DM, HTN, HLD, and CKD Stage IIIB, hx of colon CA    Limitations Fall Risk.    Patient Stated  Goals Speech    Currently in Pain? No/denies                          OT Treatments/Exercises (OP) - 10/23/20 0001       ADLs   ADL Comments Reviewed OT goals.      Neurological Re-education Exercises   Other Exercises 1 Patient educated in putty exercises to improve grip strength in non-dominant LUE, and also coordiantion exercises.  Wife surprised to see patient struggling with FM coord tasks                    OT Education - 10/23/20 1527     Education Details goals for OT, putty - red, and fine motor coordination    Person(s) Educated Patient;Spouse    Methods Explanation;Demonstration;Tactile cues;Verbal cues;Handout    Comprehension Verbalized understanding;Returned demonstration;Verbal cues required              OT Short Term Goals - 10/23/20 1151       OT SHORT TERM GOAL #1   Title Pt will be independent with HEP for RUE grip strength and coordination    Time 3    Period Weeks  Status On-going    Target Date 11/09/20      OT SHORT TERM GOAL #2   Title Pt will perform environmental scanning with 90% accuracy or greater.    Time 3    Period Weeks    Status On-going               OT Long Term Goals - 10/23/20 1152       OT LONG TERM GOAL #1   Title Pt will be independent with updated HEPs    Time 6    Period Weeks    Status On-going      OT LONG TERM GOAL #2   Title Pt will improve grip strength in RUE by 5 lbs or greater for increased use of nondominant hand.    Baseline L 51.5 R 37.6    Time 6    Period Weeks    Status On-going      OT LONG TERM GOAL #3   Title Pt will increase fine motor coordination in RUE by completing 9 hole peg test in 37 seconds or less    Baseline R 43.98s, L 37.25s    Time 6    Period Weeks    Status On-going      OT LONG TERM GOAL #4   Title Pt will perform physical and cognitive task simultaneously with 90% accuracy or greater.    Time 6    Period Weeks    Status On-going                    Plan - 10/23/20 1528     Clinical Impression Statement Pt has returned for OT following evaluation.  Patient with limited insight into deficits - although improving physically.    OT Occupational Profile and History Problem Focused Assessment - Including review of records relating to presenting problem    Occupational performance deficits (Please refer to evaluation for details): IADL's;Leisure    Body Structure / Function / Physical Skills ADL;FMC;Coordination;ROM;IADL;UE functional use;Dexterity;GMC;Strength    Cognitive Skills Attention;Safety Awareness    Rehab Potential Good    Clinical Decision Making Limited treatment options, no task modification necessary    Comorbidities Affecting Occupational Performance: None    Modification or Assistance to Complete Evaluation  No modification of tasks or assist necessary to complete eval    OT Frequency 2x / week    OT Duration 6 weeks   8 visits over 6 weeks to allow for scheduling conflicts   OT Treatment/Interventions Self-care/ADL training;DME and/or AE instruction;Functional Mobility Training;Patient/family education;Energy conservation;Therapeutic exercise;Cognitive remediation/compensation;Therapeutic activities;Neuromuscular education;Manual Therapy    Plan check on coordination HEP, theraputty HEP for RUE,work on alternating attention, environmental scanning    Consulted and Agree with Plan of Care Patient;Family member/caregiver    Family Member Consulted spouse, [name]             Patient will benefit from skilled therapeutic intervention in order to improve the following deficits and impairments:   Body Structure / Function / Physical Skills: ADL, FMC, Coordination, ROM, IADL, UE functional use, Dexterity, GMC, Strength Cognitive Skills: Attention, Safety Awareness     Visit Diagnosis: Attention and concentration deficit  Muscle weakness (generalized)  Other lack of coordination  Unsteadiness on  feet    Problem List Patient Active Problem List   Diagnosis Date Noted   Abnormality of gait 09/10/2020   Leukocytosis    Somnolence    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/2020   Acute right hemiparesis ([facility]) 07/19/2020  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 [place], [place], [address] Phone: [phone]   Fax:  [phone]  Name: Edwin Wall MRN: [id number] Date of Birth: Jun 25, 1940

## 2020-10-26 ENCOUNTER — Ambulatory Visit: Payer: Medicare Other

## 2020-10-26 ENCOUNTER — Other Ambulatory Visit: Payer: Self-pay

## 2020-10-26 DIAGNOSIS — R278 Other lack of coordination: Secondary | ICD-10-CM

## 2020-10-26 DIAGNOSIS — R4701 Aphasia: Secondary | ICD-10-CM

## 2020-10-26 DIAGNOSIS — M6281 Muscle weakness (generalized): Secondary | ICD-10-CM

## 2020-10-26 DIAGNOSIS — R2681 Unsteadiness on feet: Secondary | ICD-10-CM

## 2020-10-26 DIAGNOSIS — R41841 Cognitive communication deficit: Secondary | ICD-10-CM

## 2020-10-26 NOTE — Patient Instructions (Signed)
  Please complete the assigned speech therapy homework prior to your next session and return it to the speech therapist at your next visit.  

## 2020-10-26 NOTE — Therapy (Signed)
[facility] [address] [place], [place], [address] Phone: [phone]   Fax:  [phone]  Physical Therapy Treatment  Patient Details  Name: Edwin Wall MRN: [id number] Date of Birth: [birth year] Referring Provider (PT): Dr. Courtney Heys   Encounter Date: 10/26/2020    Past Medical History:  Diagnosis Date   Anxiety    Arthritis    Cataract    Colon cancer ([facility]) dx'd 11/2016   "stage 3"   Gout    Gout    Hemorrhoids    History of kidney stones    "passed it"   Hypertension    Pre-diabetes     Past Surgical History:  Procedure Laterality Date   COLECTOMY  12/17/2016   lap; partial sigmoid colectomy/notes 12/17/2016   COLONOSCOPY W/ BIOPSIES AND POLYPECTOMY  11/11/2016   LAPAROSCOPIC SIGMOID COLECTOMY [name] 12/17/2016   Procedure: LAPAROSCOPIC SIGMOID COLECTOMY;  Surgeon: [name], MD;  Location: [facility];  Service: General;  Laterality: [name];   NASAL SEPTUM SURGERY     TONSILLECTOMY      There were no vitals filed for this visit.   Subjective Assessment - 10/26/20 1234     Subjective Has not had any issues with nausea. Feels    Patient is accompained by: Family member    Pertinent History 80 yo male presents to the ED on 5/25 with speech difficutlies and facial droop. 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 PMH including anxiety, arthritis, colon cancer (dx 2018), HTN, obesity, and pre-daibetes.                               [facility] Adult PT Treatment/Exercise - 10/26/20 0001       Transfers   Transfers Sit to Stand;Stand to Sit    Sit to Stand 4: Min guard    Comments 10x with arms crossed      Knee/Hip Exercises: Aerobic   Other Aerobic Scifit L3 seat 20 arms 7 8'      Knee/Hip Exercises: Seated   Long Arc Quad Strengthening;1 set;Limitations;15 reps    Long Arc Quad  Weight 3 lbs.    Long Arc [employer] Limitations alt with lat press    Marching Both;1 set;Limitations;15 reps    Marching Limitations alt with lat press    Marching Weights 3 lbs.      Ankle Exercises: Standing   Heel Raises Both;Other (comment);15 reps    Heel Raises Limitations BUE support    Toe Raise 15 reps    Toe Raise Limitations BUE support                 Balance Exercises - 10/26/20 0001       Balance Exercises: Standing   SLS with Vectors Limitations;Foam/compliant surface    SLS with Vectors Limitations standing w/o UE support tapping 2 targets unilatera, 2 targets alt. 2 targets alt cross body, 1 rarget alt. cross body. Floor pebbles used.    Step Ups Forward;Lateral;4 inch;Limitations    Step Ups Limitations no UE support 10 reps ea. leg    Retro Gait 2 reps;Limitations    Retro Gait Limitations 49f x2 with CGA, EO/EC 264fwith CGA                 PT Short Term Goals - 10/23/20 1414       PT SHORT TERM  GOAL #1   Title Patient to become I in [place] TBD; 10/19/20 Able to perform HEP w/o cuing    Time 2    Period Weeks    Status Achieved    Target Date 10/16/20      PT SHORT TERM GOAL #2   Title Patient to demo 4/5 sterngth in [place] 4-/5 B hip and knee flexion    Time 2    Period Weeks    Status On-going    Target Date 10/16/20      PT SHORT TERM GOAL #3   Title Patient to ambulate 534f with S and SPC    Baseline 1064fwith SBA and SPC; 10/23/20 50054fmbulaiton with SBA, no AD    Time 2    Period Weeks    Status Achieved    Target Date 10/16/20               PT Long Term Goals - 09/25/20 1159       PT LONG TERM GOAL #1   Title Patient to demo final HEP back to PT w/o cuing    Baseline TBD    Time 4    Period Weeks    Status New    Target Date 10/30/20      PT LONG TERM GOAL #2   Title Patient to ambulate 1000f63fth LRAD under S    Baseline 100ft17fh SPC and S    Time 4    Period Weeks     Status New    Target Date 10/30/20      PT LONG TERM GOAL #3   Title Patient to negotiate a full flight of stairs with most appropriate pattern    Baseline 4 steps alternating pattern with single rail    Time 4    Period Weeks    Status New    Target Date 10/30/20      PT LONG TERM GOAL #4   Title Patient to increase DGI score to 22    Baseline 18 DGI score    Time 4    Period Weeks    Status New    Target Date 10/30/20                   Plan - 10/26/20 1240     Clinical Impression Statement Continued to work on functional LE strengthening and highter level balance tasks.  Increased wt with LE tasks, added resistance to aerobic exercises and advanced challenges on balance tasks using compliant surafaces.  Now able to complete 10x STS w/o UE support    Personal Factors and Comorbidities Age    Examination-Activity Limitations Locomotion Level;Stairs    Stability/Clinical Decision Making Evolving/Moderate complexity    Rehab Potential Good    PT Frequency 2x / week    PT Duration 4 weeks    PT Treatment/Interventions ADLs/Self Care Home Management;Aquatic Therapy;DME Instruction;Gait training;Stair training;Functional mobility training;Therapeutic activities;Therapeutic exercise;Balance training;Neuromuscular re-education;Patient/family education    PT Next Visit Plan gait and balance training. LE strengthening and aerobic work, EC tasks for balance, compliant surface training    PT Home [place]gree with Plan of Care Patient;Family member/caregiver    Family Member Consulted spouse             Patient will benefit from skilled therapeutic intervention in order to improve the following deficits and impairments:  Abnormal gait, Decreased endurance,  Increased edema, Decreased activity tolerance, Decreased strength, Decreased balance, Difficulty walking  Visit Diagnosis: Muscle weakness (generalized)  Other lack of  coordination  Unsteadiness on feet     Problem List Patient Active Problem List   Diagnosis Date Noted   Abnormality of gait 09/10/2020   Leukocytosis    Somnolence    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/2020   Acute right hemiparesis (HCC) 07/19/2020   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 [place], [place], [address] Phone: [phone]   Fax:  [phone]  Name: Edwin Wall MRN: [id number] Date of Birth: Dec 30, 1940

## 2020-10-26 NOTE — Therapy (Signed)
[facility] [address] [place], [place], [address] Phone: [phone]   Fax:  [phone]  Speech Language Pathology Treatment  Patient Details  Name: Edwin Wall MRN: [id number] Date of Birth: 19-Nov-1940 Referring Provider (SLP): Courtney Heys, MD   Encounter Date: 10/26/2020   End of Session - 10/26/20 1309     Visit Number 7    Number of Visits 25    Date for SLP Re-Evaluation 12/24/20    SLP Start Time 1150    SLP Stop Time  [id number]    SLP Time Calculation (min) 40 min    Activity Tolerance Patient tolerated treatment well             Past Medical History:  Diagnosis Date   Anxiety    Arthritis    Cataract    Colon cancer ([facility]) dx'd 11/2016   "stage 3"   Gout    Gout    Hemorrhoids    History of kidney stones    "passed it"   Hypertension    Pre-diabetes     Past Surgical History:  Procedure Laterality Date   COLECTOMY  12/17/2016   lap; partial sigmoid colectomy/notes 12/17/2016   COLONOSCOPY W/ BIOPSIES AND POLYPECTOMY  11/11/2016   LAPAROSCOPIC SIGMOID COLECTOMY [name] 12/17/2016   Procedure: LAPAROSCOPIC SIGMOID COLECTOMY;  Surgeon: [name], MD;  Location: [facility];  Service: General;  Laterality: [name];   NASAL SEPTUM SURGERY     TONSILLECTOMY      There were no vitals filed for this visit.   Subjective Assessment - 10/26/20 1306     Subjective "how are you?"    Patient is accompained by: Family member   wife   Currently in Pain? No/denies                   ADULT SLP TREATMENT - 10/26/20 1307       General Information   Behavior/Cognition Alert;Cooperative;Pleasant mood;Requires cueing      Treatment Provided   Treatment provided Cognitive-Linquistic      Cognitive-Linquistic Treatment   Treatment focused on Aphasia;Cognition    Skilled Treatment SLP addresed pt's aphasia today as this has been a challenge for him, per pt. SLP provided "three clues" and pt  named object 50% of the time, requiring max A for further assistance. Some of pt's difficulty felt to be cognitive in that pt was not reasoning correctly for commonaity between words.      Assessment / Recommendations / Plan   Plan Continue with current plan of care      Progression Toward Goals   Progression toward goals Progressing toward goals                SLP Short Term Goals - 10/26/20 1309       SLP SHORT TERM GOAL #1   Title Pt will provide functional 2-3 sentence responses (spoken) to simple stimuli in at least 20 opportunities in 3 sessions    Time 4    Period Weeks    Status On-going    Target Date 10/27/20      SLP SHORT TERM GOAL #2   Title In 5 minutes mod complex conversation pt will demonstrate understanding with modified independence (compensations) x3 sessions    Time 6    Period Weeks    Status On-going    Target Date 11/09/20      SLP SHORT TERM GOAL #3   Title In 5 minutes mod  complex conversation pt will demo functional verbal expression with use of language compensations x3 sessions    Time 6    Period Weeks    Status On-going    Target Date 11/09/20      SLP SHORT TERM GOAL #4   Title Pt will demo knowledge of expressive language errors in 5 minutes conversation with SLP nonverbal cues x3 sessions    Time 6    Period Weeks    Status On-going    Target Date 11/09/20      SLP SHORT TERM GOAL #5   Title pt will dmeo understanding of functional written material (to-do lists, simple notes, etc) with extra time, in 3 sessions    Time 4    Period Weeks    Status On-going    Target Date 10/26/20              SLP Long Term Goals - 10/26/20 1309       SLP LONG TERM GOAL #1   Title In 10 minutes mod complex conversation pt will demonstrate understanding with modified independence (compensations) in 3 sessions    Time 8    Period Weeks    Status On-going      SLP LONG TERM GOAL #2   Title In 10 minutes mod complex conversation pt will  demo functional verbal expression with use of language compensations x3 sessions    Time 8    Period Weeks    Status On-going      SLP LONG TERM GOAL #3   Title Pt will demo knowledge of expressive language errors in 10 minutes conversation with SLP/listener nonverbal cues x3 sessions    Time 8    Period Weeks    Status On-going      SLP LONG TERM GOAL #4   Title In 15 minutes mod complex conversation pt will demo functional verbal expression with use of language compensations x3 sessions    Time 12    Period Weeks    Status On-going      SLP LONG TERM GOAL #5   Title In 10 minutes mod complex conversation pt will demonstrate understanding with modified independence (compensations) in 3 sessions    Time 12    Period Weeks    Status On-going              Plan - 10/26/20 1309     Clinical Impression Statement Edwin "Claiborne Wall" 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 Today, pt showed some evidence of intellectual awareness of anomia, paraphasias, and errors on structured tasks during this therapy course. [name] would benefit from skilled ST targeting improved accuracy of verbal expression, error awareness to increase his independence with verbal expression, and incr usage of a wider range of compensatory strategies to improve efficiency and flow of verbal expression. Pt states he would like to be more accurate in his speech and wife states she needs to occasionally ask clarifying questions for complete comprehension of pt's message.    Speech Therapy Frequency 2x / week    Duration 12 weeks    Treatment/Interventions Functional tasks;Compensatory techniques;Multimodal communcation approach;SLP instruction and feedback;Cueing hierarchy;Language facilitation;Cognitive reorganization;Patient/family education;Internal/external aids    Potential to Achieve Goals Good     Consulted and Agree with Plan of Care Patient;Family member/caregiver    Family Member Consulted wife  Patient will benefit from skilled therapeutic intervention in order to improve the following deficits and impairments:   Aphasia  Cognitive communication deficit    Problem List Patient Active Problem List   Diagnosis Date Noted   Abnormality of gait 09/10/2020   Leukocytosis    Somnolence    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/2020   Acute right hemiparesis (HCC) 07/19/2020   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 [place], [place], [address] Phone: [phone]   Fax:  [phone]   Name: GLEEN NORTZ MRN: [id number] Date of Birth: 24-Apr-1940

## 2020-10-30 ENCOUNTER — Ambulatory Visit: Payer: Medicare Other

## 2020-10-30 ENCOUNTER — Other Ambulatory Visit: Payer: Self-pay

## 2020-10-30 ENCOUNTER — Ambulatory Visit: Payer: Medicare Other | Admitting: Physical Therapy

## 2020-10-30 ENCOUNTER — Encounter: Payer: Self-pay | Admitting: Physical Therapy

## 2020-10-30 DIAGNOSIS — R278 Other lack of coordination: Secondary | ICD-10-CM

## 2020-10-30 DIAGNOSIS — R2681 Unsteadiness on feet: Secondary | ICD-10-CM

## 2020-10-30 DIAGNOSIS — R4701 Aphasia: Secondary | ICD-10-CM

## 2020-10-30 DIAGNOSIS — M6281 Muscle weakness (generalized): Secondary | ICD-10-CM

## 2020-10-30 DIAGNOSIS — R41841 Cognitive communication deficit: Secondary | ICD-10-CM

## 2020-10-30 NOTE — Therapy (Signed)
[facility] [address] [place], [place], [address] Phone: [phone]   Fax:  [phone]  Speech Language Pathology Treatment  Patient Details  Name: Edwin Wall MRN: [id number] Date of Birth: Jun 01, 1940 Referring Provider (SLP): Courtney Heys, MD   Encounter Date: 10/30/2020   End of Session - 10/30/20 1645     Visit Number 8    Number of Visits 25    Date for SLP Re-Evaluation 12/24/20    SLP Start Time 0712    SLP Stop Time  1400    SLP Time Calculation (min) 42 min    Activity Tolerance Patient tolerated treatment well             Past Medical History:  Diagnosis Date   Anxiety    Arthritis    Cataract    Colon cancer ([facility]) dx'd 11/2016   "stage 3"   Gout    Gout    Hemorrhoids    History of kidney stones    "passed it"   Hypertension    Pre-diabetes     Past Surgical History:  Procedure Laterality Date   COLECTOMY  12/17/2016   lap; partial sigmoid colectomy/notes 12/17/2016   COLONOSCOPY W/ BIOPSIES AND POLYPECTOMY  11/11/2016   LAPAROSCOPIC SIGMOID COLECTOMY [name] 12/17/2016   Procedure: LAPAROSCOPIC SIGMOID COLECTOMY;  Surgeon: [name], MD;  Location: [facility];  Service: General;  Laterality: [name];   NASAL SEPTUM SURGERY     TONSILLECTOMY      There were no vitals filed for this visit.   Subjective Assessment - 10/30/20 1321     Subjective "Have you met my wife?"    Patient is accompained by: Family member   wife   Currently in Pain? No/denies                   ADULT SLP TREATMENT - 10/30/20 1322       General Information   Behavior/Cognition Alert;Cooperative;Pleasant mood;Requires cueing      Treatment Provided   Treatment provided Cognitive-Linquistic      Cognitive-Linquistic Treatment   Treatment focused on Cognition;Aphasia    Skilled Treatment Aphasia was targeted today with pt's homework.  Max A necessary "I can see it, but I just can't bring  it up." to 1/3 responses volcano, deer, and Art gallery manager shop. The other 2/3 it appeared pt was unable to reason linguistically to find a commonality between the words. Dysnomias heard today, without pt awareness unless SLP repeated them for pt: "Jelly beans"/ jelly, "cancer"/CVA. During homework segmen, pt's wife beginning to ask appropriate ?s/cue appropriately instead of just providing pt the word he cannot think of. Pt asking about whether he could drive. SLP told pt that divided attention, appropriate reaction time, and other requirements are all important in considering this - told them Dr. Dagoberto Ligas is likely the one to ask about this.      Assessment / Recommendations / Plan   Plan Continue with current plan of care      Progression Toward Goals   Progression toward goals Progressing toward goals              SLP Education - 10/30/20 1644     Education Details many things associated with assessing whether pt could drive or not, Dr. Dagoberto Ligas the one to ask re: this    Person(s) Educated Patient;Spouse    Methods Explanation    Comprehension Verbalized understanding  SLP Short Term Goals - 10/30/20 1648       SLP SHORT TERM GOAL #1   Title Pt will provide functional 2-3 sentence responses (spoken) to simple stimuli in at least 20 opportunities in 3 sessions    Time 4    Period Weeks    Status On-going    Target Date 10/27/20      SLP SHORT TERM GOAL #2   Title In 5 minutes mod complex conversation pt will demonstrate understanding with modified independence (compensations) x3 sessions    Time 6    Period Weeks    Status On-going    Target Date 11/09/20      SLP SHORT TERM GOAL #3   Title In 5 minutes mod complex conversation pt will demo functional verbal expression with use of language compensations x3 sessions    Time 6    Period Weeks    Status On-going    Target Date 11/09/20      SLP SHORT TERM GOAL #4   Title Pt will demo knowledge of expressive  language errors in 5 minutes conversation with SLP nonverbal cues x3 sessions    Time 6    Period Weeks    Status On-going    Target Date 11/09/20      SLP SHORT TERM GOAL #5   Title pt will dmeo understanding of functional written material (to-do lists, simple notes, etc) with extra time, in 3 sessions    Time 4    Period Weeks    Status On-going    Target Date 10/26/20              SLP Long Term Goals - 10/30/20 1648       SLP LONG TERM GOAL #1   Title In 10 minutes mod complex conversation pt will demonstrate understanding with modified independence (compensations) in 3 sessions    Time 8    Period Weeks    Status On-going      SLP LONG TERM GOAL #2   Title In 10 minutes mod complex conversation pt will demo functional verbal expression with use of language compensations x3 sessions    Time 8    Period Weeks    Status On-going      SLP LONG TERM GOAL #3   Title Pt will demo knowledge of expressive language errors in 10 minutes conversation with SLP/listener nonverbal cues x3 sessions    Time 8    Period Weeks    Status On-going      SLP LONG TERM GOAL #4   Title In 15 minutes mod complex conversation pt will demo functional verbal expression with use of language compensations x3 sessions    Time 12    Period Weeks    Status On-going      SLP LONG TERM GOAL #5   Title In 10 minutes mod complex conversation pt will demonstrate understanding with modified independence (compensations) in 3 sessions    Time 12    Period Weeks    Status On-going              Plan - 10/30/20 1645     Clinical Impression Statement Edwin "Claiborne Billings" 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 Today, pt showed limited/no intellectual awareness of dysnomias, and other errors on structured tasks during this therapy course. His presentation is as such suggesting that there may  have  been some hidden pre-existing brain changes with language/memory prior to his CVA. Wife showing progress in how she is cueing pt with anomia in structured tasks. Edwin Wall would benefit from skilled ST targeting improved accuracy of verbal expression, error awareness to increase his independence with verbal expression, and incr usage of a wider range of compensatory strategies to improve efficiency and flow of verbal expression. Pt states he would like to be more accurate in his speech and wife states she needs to occasionally ask clarifying questions for complete comprehension of pt's message.    Speech Therapy Frequency 2x / week    Duration 12 weeks    Treatment/Interventions Functional tasks;Compensatory techniques;Multimodal communcation approach;SLP instruction and feedback;Cueing hierarchy;Language facilitation;Cognitive reorganization;Patient/family education;Internal/external aids    Potential to Achieve Goals Good    Consulted and Agree with Plan of Care Patient;Family member/caregiver    Family Member Consulted wife             Patient will benefit from skilled therapeutic intervention in order to improve the following deficits and impairments:   Aphasia  Cognitive communication deficit    Problem List Patient Active Problem List   Diagnosis Date Noted   Abnormality of gait 09/10/2020   Leukocytosis    Somnolence    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/2020   Acute right hemiparesis (HCC) 07/19/2020   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 [place], [place], [address] Phone: [phone]   Fax:  [phone]   Name: Edwin Wall MRN: [id number] Date of Birth: 29-Jul-1940

## 2020-10-31 NOTE — Therapy (Signed)
[facility] [address] [place], [place], [address] Phone: [phone]   Fax:  [phone]  Physical Therapy Treatment  Patient Details  Name: Edwin Wall MRN: [id number] Date of Birth: 08/26/1940 Referring Provider (PT): Dr. Courtney Heys   Encounter Date: 10/30/2020   PT End of Session - 10/30/20 1236     Visit Number 6    Number of Visits 9    Date for PT Re-Evaluation 11/30/20    Authorization Type MCR/AARP    Progress Note Due on Visit 10    PT Start Time 1234    PT Stop Time 1315    PT Time Calculation (min) 41 min    Equipment Utilized During Treatment Gait belt    Activity Tolerance Patient tolerated treatment well    Behavior During Therapy [facility] for tasks assessed/performed             Past Medical History:  Diagnosis Date   Anxiety    Arthritis    Cataract    Colon cancer ([place]) dx'd 11/2016   "stage 3"   Gout    Gout    Hemorrhoids    History of kidney stones    "passed it"   Hypertension    Pre-diabetes     Past Surgical History:  Procedure Laterality Date   COLECTOMY  12/17/2016   lap; partial sigmoid colectomy/notes 12/17/2016   COLONOSCOPY W/ BIOPSIES AND POLYPECTOMY  11/11/2016   LAPAROSCOPIC SIGMOID COLECTOMY [name] 12/17/2016   Procedure: LAPAROSCOPIC SIGMOID COLECTOMY;  Surgeon: [name], MD;  Location: [place];  Service: General;  Laterality: [name];   NASAL SEPTUM SURGERY     TONSILLECTOMY      There were no vitals filed for this visit.   Subjective Assessment - 10/30/20 1236     Subjective No new complaints. No falls or pain to report.    Patient is accompained by: Family member   [name]   Pertinent History 80 yo male presents to the ED on 5/25 with speech difficutlies and facial droop. 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 PMH including anxiety, arthritis, colon  cancer (dx 2018), HTN, obesity, and pre-daibetes.    Patient Stated Goals Return to PLOF    Currently in Pain? No/denies                [facility] PT Assessment - 10/30/20 1237       Dynamic Gait Index   Level Surface Normal    Change in Gait Speed Normal    Gait with Horizontal Head Turns Mild Impairment    Gait with Vertical Head Turns Normal    Gait and Pivot Turn Mild Impairment    Step Over Obstacle Mild Impairment    Step Around Obstacles Normal    Steps Mild Impairment    Total Score 20                   [facility] Adult PT Treatment/Exercise - 10/30/20 1237       Transfers   Transfers Sit to Stand;Stand to Sit    Sit to Stand 5: Supervision;With upper extremity assist;From bed;From chair/3-in-1    Stand to Sit 4: Min guard;5: Supervision;With upper extremity assist;To bed;To chair/3-in-1      Ambulation/Gait   Ambulation/Gait Yes    Ambulation/Gait Assistance 5: Supervision    Ambulation/Gait Assistance Details cues for increased step length and heel>toe step progression. Pt able  to return demo for short distances before reverting back to short shuffled steps. no balance issues noted with gait.    Ambulation Distance (Feet) 630 Feet   x1, plus around clinic with session   Assistive device None    Gait Pattern Step-through pattern;Decreased arm swing - left;Decreased step length - right;Decreased step length - left;Trunk flexed    Ambulation Surface Level;Unlevel;Indoor;Outdoor;Paved    Gait velocity 12.53 sec's= 2.62 ft/sec, or 0.80 m/s    Stairs Yes    Stairs Assistance 5: Supervision    Stairs Assistance Details (indicate cue type and reason) cues to advance hands on rails and for weight shifting. increased time needed for reciprocal descent.    Stair Management Technique Two rails;Alternating pattern;Forwards    Number of Stairs 4   x4 reps   Height of Stairs 6      Knee/Hip Exercises: Aerobic   Other Aerobic Scifit Level 3.5 with UE/LE's x 8 minutes with  goal >/= 80 steps per minute for strengthening and activity tolerance.                  PT Short Term Goals - 10/23/20 1414       PT SHORT TERM GOAL #1   Title Patient to become I in [place] TBD; 10/19/20 Able to perform HEP w/o cuing    Time 2    Period Weeks    Status Achieved    Target Date 10/16/20      PT SHORT TERM GOAL #2   Title Patient to demo 4/5 sterngth in [place] 4-/5 B hip and knee flexion    Time 2    Period Weeks    Status On-going    Target Date 10/16/20      PT SHORT TERM GOAL #3   Title Patient to ambulate 520ft with S and SPC    Baseline 183ft with SBA and SPC; 10/23/20 551ft ambulaiton with SBA, no AD    Time 2    Period Weeks    Status Achieved    Target Date 10/16/20               PT Long Term Goals - 10/30/20 1630       PT LONG TERM GOAL #1   Title Patient to demo final HEP back to PT w/o cuing    Baseline 10/30/20:  met with current program, will benefit from advancement as pt progresses    Status Achieved      PT LONG TERM GOAL #2   Title Patient to ambulate 1084ft with LRAD under S    Baseline 10/30/20: 6030 feet with no device this session before pt requested to sit down to rest, improved just not to goal level    Time --    Period --    Status Partially Met      PT LONG TERM GOAL #3   Title Patient to negotiate a full flight of stairs with most appropriate pattern    Baseline 10/30/20: met in session with bil rails with reciprocal pattern    Time --    Period --    Status Achieved      PT LONG TERM GOAL #4   Title Patient to increase DGI score to 22    Baseline 10/30/20: 20/24 scored today, improved from 18/24 just not to goal level    Time --    Period --    Status Partially Met  Plan - 10/30/20 1237     Clinical Impression Statement Today's skilled session focused on progress toward goals for recert by primary PT. Pt met the stair and HEP goals. The pt showed  progress toward distance gait goals and DGI goal. The pt should benefit from continued PT to progress toward unmet goals, primary PT plans to recert. Ended today's session with Scifit to work on strengthening and activity tolerance with no issues noted or reported by patient.    Personal Factors and Comorbidities Age    Examination-Activity Limitations Locomotion Level;Stairs    Stability/Clinical Decision Making Evolving/Moderate complexity    Rehab Potential Good    PT Frequency 2x / week    PT Duration 4 weeks    PT Treatment/Interventions ADLs/Self Care Home Management;Aquatic Therapy;DME Instruction;Gait training;Stair training;Functional mobility training;Therapeutic activities;Therapeutic exercise;Balance training;Neuromuscular re-education;Patient/family education    PT Next Visit Plan Continue LE sterngthening, compliant surface balance and stepping tasks    PT Home Exercise Plan [id number]    Consulted and Agree with Plan of Care Patient;Family member/caregiver    Family Member Consulted spouse             Patient will benefit from skilled therapeutic intervention in order to improve the following deficits and impairments:  Abnormal gait, Decreased endurance, Increased edema, Decreased activity tolerance, Decreased strength, Decreased balance, Difficulty walking  Visit Diagnosis: Muscle weakness (generalized)  Unsteadiness on feet  Other lack of coordination     Problem List Patient Active Problem List   Diagnosis Date Noted   Abnormality of gait 09/10/2020   Leukocytosis    Somnolence    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/2020   Acute right hemiparesis (HCC) 07/19/2020   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   AKI (acute kidney injury) ([place])    Prediabetes    New onset atrial fibrillation ([place])    Dysphagia    Hyperlipidemia 07/16/2020   Type II diabetes mellitus ([place]) 07/16/2020   Atrial fibrillation ([place]) 07/16/2020   Acute CVA (cerebrovascular accident) ([place])  07/15/2020   Cancer of sigmoid colon ([place]) 12/17/2016   Obesity (BMI 30-39.9) 01/15/2011   Hypertension 01/15/2011   History of gout 01/15/2011   ED (erectile dysfunction) 01/15/2011    Willow Ora, PTA, [facility] [address] [place], [place] [address] [phone] 10/31/20, 8:52 AM   Name: Edwin Wall MRN: [id number] Date of Birth: [birth year]

## 2020-11-02 ENCOUNTER — Encounter: Payer: Self-pay | Admitting: Occupational Therapy

## 2020-11-02 ENCOUNTER — Ambulatory Visit: Payer: Medicare Other

## 2020-11-02 ENCOUNTER — Other Ambulatory Visit: Payer: Self-pay

## 2020-11-02 ENCOUNTER — Ambulatory Visit: Payer: Medicare Other | Admitting: Occupational Therapy

## 2020-11-02 ENCOUNTER — Ambulatory Visit: Payer: Medicare Other | Attending: Physical Medicine and Rehabilitation

## 2020-11-02 DIAGNOSIS — R41840 Attention and concentration deficit: Secondary | ICD-10-CM | POA: Diagnosis present

## 2020-11-02 DIAGNOSIS — R278 Other lack of coordination: Secondary | ICD-10-CM

## 2020-11-02 DIAGNOSIS — R2681 Unsteadiness on feet: Secondary | ICD-10-CM | POA: Diagnosis present

## 2020-11-02 DIAGNOSIS — R4701 Aphasia: Secondary | ICD-10-CM | POA: Diagnosis present

## 2020-11-02 DIAGNOSIS — M6281 Muscle weakness (generalized): Secondary | ICD-10-CM

## 2020-11-02 DIAGNOSIS — R41841 Cognitive communication deficit: Secondary | ICD-10-CM

## 2020-11-02 NOTE — Therapy (Signed)
[facility] [address] [place], [place], [address] Phone: [phone]   Fax:  [phone]  Speech Language Pathology Treatment  Patient Details  Name: Edwin Wall MRN: [id number] Date of Birth: 12/05/1940 Referring Provider (SLP): Courtney Heys, MD   Encounter Date: 11/02/2020   End of Session - 11/02/20 1703     Visit Number 9    Number of Visits 25    Date for SLP Re-Evaluation 12/24/20    SLP Start Time 1150    SLP Stop Time  1232    SLP Time Calculation (min) 42 min    Activity Tolerance Patient tolerated treatment well             Past Medical History:  Diagnosis Date   Anxiety    Arthritis    Cataract    Colon cancer ([place]) dx'd 11/2016   "stage 3"   Gout    Gout    Hemorrhoids    History of kidney stones    "passed it"   Hypertension    Pre-diabetes     Past Surgical History:  Procedure Laterality Date   COLECTOMY  12/17/2016   lap; partial sigmoid colectomy/notes 12/17/2016   COLONOSCOPY W/ BIOPSIES AND POLYPECTOMY  11/11/2016   LAPAROSCOPIC SIGMOID COLECTOMY [name] 12/17/2016   Procedure: LAPAROSCOPIC SIGMOID COLECTOMY;  Surgeon: [name], MD;  Location: [place];  Service: General;  Laterality: [name];   NASAL SEPTUM SURGERY     TONSILLECTOMY      There were no vitals filed for this visit.   Subjective Assessment - 11/02/20 1154     Subjective "I didin't do your stuff because it wasn't interesting."    Patient is accompained by: Family member   wife   Currently in Pain? No/denies                   ADULT SLP TREATMENT - 11/02/20 1155       General Information   Behavior/Cognition Doesn't follow directions;Requires cueing;Alert;Cooperative;Pleasant mood      Treatment Provided   Treatment provided Cognitive-Linquistic      Cognitive-Linquistic Treatment   Treatment focused on Cognition;Aphasia    Skilled Treatment Pt was told by SLP (including wife) the  necessity of completing homework to showing progress with ST goals. SLP encouraged pt to ask wife if he cannot figure out homework. unaware of dysnomias "mints"/chicken, "[employer], "stitch"/stick. Max/total assist necessary for homework for finding words with "orn" as final letters using clues. SLP simplified homework this time and had pt do divergent naming, and odd man out tasks (with "why?"). SLP stressed to pt/wife that pt's language very likely will not recover to premorbid level, and that SLP highly suspects some brain differences prior to his CVA which is making these tasks so challenging for him. He and wife agree pt would not have had THIS much difficulty with tasks like this prior to CVA. SLP also reiterated pt's use of compensations such as drawing.      Assessment / Recommendations / Plan   Plan Continue with current plan of care   consider reduction to once/week if progress continues as limited/difficult -SLP made pt/wife aware of this possibility today     Progression Toward Goals   Progression toward goals Progressing toward goals              SLP Education - 11/02/20 1702     Education Details see skilled intervention    Person(s) Educated Patient;Spouse  Methods Explanation;Demonstration;Verbal cues    Comprehension Verbalized understanding;Returned demonstration;Verbal cues required;Need further instruction              SLP Short Term Goals - 11/02/20 1705       SLP SHORT TERM GOAL #1   Title Pt will provide functional 2-3 sentence responses (spoken) to simple stimuli in at least 20 opportunities in 3 sessions    Time 4    Period Weeks    Status Partially Met    Target Date 10/27/20      SLP SHORT TERM GOAL #2   Title In 5 minutes mod complex conversation pt will demonstrate understanding with modified independence (compensations) x3 sessions    Time 6    Period Weeks    Status Not Met    Target Date 11/09/20      SLP SHORT TERM GOAL #3    Title In 5 minutes mod complex conversation pt will demo functional verbal expression with use of language compensations x3 sessions    Time 6    Period Weeks    Status Not Met    Target Date 11/09/20      SLP SHORT TERM GOAL #4   Title Pt will demo knowledge of expressive language errors in 5 minutes conversation with SLP nonverbal cues x3 sessions    Time 6    Period Weeks    Status Not Met    Target Date 11/09/20      SLP SHORT TERM GOAL #5   Title pt will dmeo understanding of functional written material (to-do lists, simple notes, etc) with extra time, in 3 sessions    Time 4    Period Weeks    Status Deferred    Target Date 10/26/20              SLP Long Term Goals - 11/02/20 1706       SLP LONG TERM GOAL #1   Title In 10 minutes mod complex conversation pt will demonstrate understanding with modified independence (compensations) in 3 sessions    Time 8    Period Weeks    Status On-going    Target Date 11/30/20      SLP LONG TERM GOAL #2   Title In 10 minutes mod complex conversation pt will demo functional verbal expression with use of language compensations x3 sessions    Time 8    Period Weeks    Status On-going    Target Date 11/30/20      SLP LONG TERM GOAL #3   Title Pt will demo knowledge of expressive language errors in 10 minutes conversation with SLP/listener nonverbal cues x3 sessions    Time 8    Period Weeks    Status On-going    Target Date 11/30/20      SLP LONG TERM GOAL #4   Title In 15 minutes mod complex conversation pt will demo functional verbal expression with use of language compensations x3 sessions    Time 12    Period Weeks    Status On-going    Target Date 12/26/20      SLP LONG TERM GOAL #5   Title In 10 minutes mod complex conversation pt will demonstrate understanding with modified independence (compensations) in 3 sessions    Time 12    Period Weeks    Status On-going    Target Date 12/26/20              Plan -  11/02/20 1703  Clinical Impression Statement Edwin "Claiborne Billings" 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 Today, pt showed limited/no intellectual awareness of dysnomias, and other errors on structured tasks during this therapy course. His presentation is as such suggesting that there may have been some hidden pre-existing brain changes with language/memory prior to his CVA. Wife showing progress in how she is cueing pt with anomia in structured tasks. Mr. Smigiel would benefit from skilled ST targeting improved accuracy of verbal expression, error awareness to increase his independence with verbal expression, and incr usage of a wider range of compensatory strategies to improve efficiency and flow of verbal expression. Progress to this point has been challenging - if this cont pt may decr to once/week. Pt states he would like to be more accurate in his speech and wife states she needs to occasionally ask clarifying questions for complete comprehension of pt's message.    Speech Therapy Frequency 2x / week    Duration 12 weeks    Treatment/Interventions Functional tasks;Compensatory techniques;Multimodal communcation approach;SLP instruction and feedback;Cueing hierarchy;Language facilitation;Cognitive reorganization;Patient/family education;Internal/external aids    Potential to Achieve Goals Good    Consulted and Agree with Plan of Care Patient;Family member/caregiver    Family Member Consulted wife             Patient will benefit from skilled therapeutic intervention in order to improve the following deficits and impairments:   Aphasia  Cognitive communication deficit    Problem List Patient Active Problem List   Diagnosis Date Noted   Abnormality of gait 09/10/2020   Leukocytosis    Somnolence    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/2020   Acute right hemiparesis (HCC) 07/19/2020   Left  middle cerebral artery stroke ([place]) 07/18/2020   AKI (acute kidney injury) ([place])    Prediabetes    New onset atrial fibrillation ([place])    Dysphagia    Hyperlipidemia 07/16/2020   Type II diabetes mellitus ([place]) 07/16/2020   Atrial fibrillation ([place]) 07/16/2020   Acute CVA (cerebrovascular accident) ([place]) 07/15/2020   Cancer of sigmoid colon ([place]) 12/17/2016   Obesity (BMI 30-39.9) 01/15/2011   Hypertension 01/15/2011   History of gout 01/15/2011   ED (erectile dysfunction) 01/15/2011    Kalayna Noy. ,[place], CCC-SLP  11/02/2020, 5:07 PM  [facility] [address] [place], [place], [address] Phone: [phone]   Fax:  [phone]   Name: Edwin Wall MRN: [id number] Date of Birth: January 13, 1941

## 2020-11-02 NOTE — Patient Instructions (Signed)
  Please complete the assigned speech therapy homework prior to your next session and return it to the speech therapist at your next visit.  

## 2020-11-02 NOTE — Therapy (Signed)
[facility] [address] [place], [place], [address] Phone: [phone]   Fax:  [phone]  Physical Therapy Treatment  Patient Details  Name: Edwin Wall MRN: [id number] Date of Birth: 10-23-1940 Referring Provider (PT): Dr. Courtney Heys   Encounter Date: 11/02/2020   PT End of Session - 11/02/20 1227     Visit Number 7    Number of Visits 9    Date for PT Re-Evaluation 11/30/20    Authorization Type MCR/AARP    Progress Note Due on Visit 10    PT Start Time 1245    PT Stop Time 1315    PT Time Calculation (min) 30 min    Equipment Utilized During Treatment Gait belt    Activity Tolerance Patient tolerated treatment well    Behavior During Therapy [facility] for tasks assessed/performed             Past Medical History:  Diagnosis Date   Anxiety    Arthritis    Cataract    Colon cancer ([place]) dx'd 11/2016   "stage 3"   Gout    Gout    Hemorrhoids    History of kidney stones    "passed it"   Hypertension    Pre-diabetes     Past Surgical History:  Procedure Laterality Date   COLECTOMY  12/17/2016   lap; partial sigmoid colectomy/notes 12/17/2016   COLONOSCOPY W/ BIOPSIES AND POLYPECTOMY  11/11/2016   LAPAROSCOPIC SIGMOID COLECTOMY [name] 12/17/2016   Procedure: LAPAROSCOPIC SIGMOID COLECTOMY;  Surgeon: [name], MD;  Location: [place];  Service: General;  Laterality: [name];   NASAL SEPTUM SURGERY     TONSILLECTOMY      There were no vitals filed for this visit.       [facility] PT Assessment - 11/02/20 0001       Strength   Right Hip Flexion 4/5    Right Hip ABduction 4/5    Left Hip Flexion 4/5    Left Hip ABduction 4/5    Right Knee Flexion 4/5    Left Knee Flexion 4/5      Transfers   Transfers Sit to Stand;Stand to Sit    Sit to Stand 5: Supervision;With upper extremity assist;From bed;From chair/3-in-1    Stand to Sit 4: Min guard;5: Supervision;With upper extremity assist;To  bed;To chair/3-in-1    Comments 10x with arms crossed bracing knees on 2 attempts against mat table                           [facility] Adult PT Treatment/Exercise - 11/02/20 0001       Ambulation/Gait   Ambulation/Gait Yes    Ambulation/Gait Assistance 5: Supervision    Ambulation/Gait Assistance Details random head turns, nods, cadence changes and pivot turns    Ambulation Distance (Feet) 200 Feet    Assistive device None    Gait Pattern Step-through pattern;Decreased arm swing - left;Decreased step length - right;Decreased step length - left;Trunk flexed    Ambulation Surface Level;Indoor    Stairs Yes    Stairs Assistance 5: Supervision;4: Min guard    Stair Management Technique Two rails;Alternating pattern;Forwards    Number of Stairs 16    Height of Stairs 6    Ramp 5: Supervision    Ramp Details (indicate cue type and reason) CGA with redirection needed to focus on task      Knee/Hip Exercises: Aerobic   Other Aerobic Scifit  Level 4 with UE/LE's x 8 minutes with goal >/= 80 steps per minute for strengthening and activity tolerance.                      PT Short Term Goals - 11/02/20 1330       PT SHORT TERM GOAL #1   Title Patient to become I in [place] TBD; 10/19/20 Able to perform HEP w/o cuing    Time 2    Period Weeks    Status Achieved    Target Date 10/16/20      PT SHORT TERM GOAL #2   Title Patient to demo 4/5 sterngth in [place]    Baseline 4-/5 B hip and knee flexion; 11/02/20 Goal met    Time 2    Period Weeks    Status Achieved    Target Date 10/16/20      PT SHORT TERM GOAL #3   Title Patient to ambulate 557ft with S and SPC    Baseline 173ft with SBA and SPC; 10/23/20 598ft ambulaiton with SBA, no AD    Time 2    Period Weeks    Status Achieved    Target Date 10/16/20               PT Long Term Goals - 10/30/20 1630       PT LONG TERM GOAL #1   Title Patient to demo final HEP back to PT w/o cuing     Baseline 10/30/20:  met with current program, will benefit from advancement as pt progresses    Status Achieved      PT LONG TERM GOAL #2   Title Patient to ambulate 104ft with LRAD under S    Baseline 10/30/20: 6030 feet with no device this session before pt requested to sit down to rest, improved just not to goal level    Time --    Period --    Status Partially Met      PT LONG TERM GOAL #3   Title Patient to negotiate a full flight of stairs with most appropriate pattern    Baseline 10/30/20: met in session with bil rails with reciprocal pattern    Time --    Period --    Status Achieved      PT LONG TERM GOAL #4   Title Patient to increase DGI score to 22    Baseline 10/30/20: 20/24 scored today, improved from 18/24 just not to goal level    Time --    Period --    Status Partially Met                   Plan - 11/02/20 1314     Clinical Impression Statement (15 min late starting)  Todays session focused on aerobic tasks and balance activity with funcitonal tasks including gait with balance challenges and ramp negotiation.  Some redirection needed due to cog deficits and tendency to lose focus on task.  Patient appeared fatigued today possibly accounting for concentration loss    Personal Factors and Comorbidities Age    Examination-Activity Limitations Locomotion Level;Stairs    Stability/Clinical Decision Making Evolving/Moderate complexity    Rehab Potential Good    PT Frequency 2x / week    PT Duration 4 weeks    PT Treatment/Interventions ADLs/Self Care Home Management;Aquatic Therapy;DME Instruction;Gait training;Stair training;Functional mobility training;Therapeutic activities;Therapeutic exercise;Balance training;Neuromuscular re-education;Patient/family education    PT Next Visit Plan  Continue LE sterngthening, compliant surface balance and stepping tasks, focus on tasks at hand    PT [place] and Agree with Plan of Care  Patient;Family member/caregiver    Family Member Consulted spouse             Patient will benefit from skilled therapeutic intervention in order to improve the following deficits and impairments:  Abnormal gait, Decreased endurance, Increased edema, Decreased activity tolerance, Decreased strength, Decreased balance, Difficulty walking  Visit Diagnosis: Muscle weakness (generalized)  Unsteadiness on feet  Other lack of coordination     Problem List Patient Active Problem List   Diagnosis Date Noted   Abnormality of gait 09/10/2020   Leukocytosis    Somnolence    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/2020   Acute right hemiparesis ([place]) 07/19/2020   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 [place], [place], [address] Phone: [phone]   Fax:  [phone]  Name: JAZMIN VENSEL MRN: [id number] Date of Birth: 1940/04/02

## 2020-11-02 NOTE — Therapy (Signed)
[facility] [address] [place], [place], [address] Phone: [phone]   Fax:  [phone]  Occupational Therapy Treatment  Patient Details  Name: Edwin Wall MRN: [id number] Date of Birth: [birth year] Referring Provider (OT): Courtney Heys, MD   Encounter Date: 11/02/2020   OT End of Session - 11/02/20 1318     Visit Number 3    Number of Visits 9    Date for OT Re-Evaluation 11/30/20    Authorization Type Medicare and AARP supplement    Authorization Time Period Follow Medicare Guidelines    Authorization - Visit Number 2    Progress Note Due on Visit 10    OT Start Time 1317    OT Stop Time 1400    OT Time Calculation (min) 43 min    Activity Tolerance Patient tolerated treatment well    Behavior During Therapy [facility] for tasks assessed/performed             Past Medical History:  Diagnosis Date   Anxiety    Arthritis    Cataract    Colon cancer ([place]) dx'd 11/2016   "stage 3"   Gout    Gout    Hemorrhoids    History of kidney stones    "passed it"   Hypertension    Pre-diabetes     Past Surgical History:  Procedure Laterality Date   COLECTOMY  12/17/2016   lap; partial sigmoid colectomy/notes 12/17/2016   COLONOSCOPY W/ BIOPSIES AND POLYPECTOMY  11/11/2016   LAPAROSCOPIC SIGMOID COLECTOMY [name] 12/17/2016   Procedure: LAPAROSCOPIC SIGMOID COLECTOMY;  Surgeon: [name], MD;  Location: [facility];  Service: General;  Laterality: [name];   NASAL SEPTUM SURGERY     TONSILLECTOMY      There were no vitals filed for this visit.   Subjective Assessment - 11/02/20 1318     Subjective  Patient denies any thing new and denies any pain.    Patient is accompanied by: Family member    Pertinent History PMH significant for DM, HTN, HLD, and CKD Stage IIIB, hx of colon CA    Limitations Fall Risk.    Patient Stated Goals Speech    Currently in Pain? No/denies                           OT Treatments/Exercises (OP) - 11/02/20 1345       Exercises   Exercises Hand      Hand Exercises   Other Hand Exercises resistance clothespins 1-8# with RUE with increasing pinch strenth with lateral and pincer. Pt wth min difficulty and increased time    Other Hand Exercises hand gripper level 1 with black spring with RUE with mod difficulty and mod drops                      OT Short Term Goals - 10/23/20 1151       OT SHORT TERM GOAL #1   Title Pt will be independent with HEP for RUE grip strength and coordination    Time 3    Period Weeks    Status On-going    Target Date 11/09/20      OT SHORT TERM GOAL #2   Title Pt will perform environmental scanning with 90% accuracy or greater.    Time 3    Period Weeks    Status On-going  OT Long Term Goals - 10/23/20 1152       OT LONG TERM GOAL #1   Title Pt will be independent with updated HEPs    Time 6    Period Weeks    Status On-going      OT LONG TERM GOAL #2   Title Pt will improve grip strength in RUE by 5 lbs or greater for increased use of nondominant hand.    Baseline L 51.5 R 37.6    Time 6    Period Weeks    Status On-going      OT LONG TERM GOAL #3   Title Pt will increase fine motor coordination in RUE by completing 9 hole peg test in 37 seconds or less    Baseline R 43.98s, L 37.25s    Time 6    Period Weeks    Status On-going      OT LONG TERM GOAL #4   Title Pt will perform physical and cognitive task simultaneously with 90% accuracy or greater.    Time 6    Period Weeks    Status On-going                   Plan - 11/02/20 1403     Clinical Impression Statement Pt progressing towards goals. Pt with poor awareness into deficits at first but after some discussion was able to identify some deficits.    OT Occupational Profile and History Problem Focused Assessment - Including review of records relating to presenting problem     Occupational performance deficits (Please refer to evaluation for details): IADL's;Leisure    Body Structure / Function / Physical Skills ADL;FMC;Coordination;ROM;IADL;UE functional use;Dexterity;GMC;Strength    Cognitive Skills Attention;Safety Awareness    Rehab Potential Good    Clinical Decision Making Limited treatment options, no task modification necessary    Comorbidities Affecting Occupational Performance: None    Modification or Assistance to Complete Evaluation  No modification of tasks or assist necessary to complete eval    OT Frequency 2x / week    OT Duration 6 weeks   8 visits over 6 weeks to allow for scheduling conflicts   OT Treatment/Interventions Self-care/ADL training;DME and/or AE instruction;Functional Mobility Training;Patient/family education;Energy conservation;Therapeutic exercise;Cognitive remediation/compensation;Therapeutic activities;Neuromuscular education;Manual Therapy    Plan check on coordination HEP, theraputty HEP for RUE,work on alternating attention, environmental scanning    Consulted and Agree with Plan of Care Patient;Family member/caregiver    Family Member Consulted spouse, [name]             Patient will benefit from skilled therapeutic intervention in order to improve the following deficits and impairments:   Body Structure / Function / Physical Skills: ADL, FMC, Coordination, ROM, IADL, UE functional use, Dexterity, GMC, Strength Cognitive Skills: Attention, Safety Awareness     Visit Diagnosis: Muscle weakness (generalized)  Unsteadiness on feet  Other lack of coordination  Attention and concentration deficit    Problem List Patient Active Problem List   Diagnosis Date Noted   Abnormality of gait 09/10/2020   Leukocytosis    Somnolence    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/2020   Acute right hemiparesis ([place]) 07/19/2020   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   AKI (acute kidney injury) ([place])    Prediabetes    New onset  atrial fibrillation ([facility])    Dysphagia    Hyperlipidemia 07/16/2020   Type II diabetes mellitus ([place]) 07/16/2020   Atrial fibrillation ([place]) 07/16/2020   Acute CVA (cerebrovascular accident) (  [place]) 07/15/2020   Cancer of sigmoid colon ([place]) 12/17/2016   Obesity (BMI 30-39.9) 01/15/2011   Hypertension 01/15/2011   History of gout 01/15/2011   ED (erectile dysfunction) 01/15/2011    Zachery Conch MOT, OTR/L  11/02/2020, 2:05 PM  [facility] [address] [place], [place], [address] Phone: [phone]   Fax:  [phone]  Name: Edwin Wall MRN: [id number] Date of Birth: 03/18/1940

## 2020-11-07 ENCOUNTER — Other Ambulatory Visit: Payer: Self-pay

## 2020-11-07 ENCOUNTER — Encounter: Payer: Self-pay | Admitting: Occupational Therapy

## 2020-11-07 ENCOUNTER — Ambulatory Visit: Payer: Medicare Other | Admitting: Occupational Therapy

## 2020-11-07 DIAGNOSIS — R41840 Attention and concentration deficit: Secondary | ICD-10-CM

## 2020-11-07 DIAGNOSIS — M6281 Muscle weakness (generalized): Secondary | ICD-10-CM

## 2020-11-07 DIAGNOSIS — R278 Other lack of coordination: Secondary | ICD-10-CM

## 2020-11-07 NOTE — Therapy (Signed)
[facility] [address] [place], [place], [address] Phone: [phone]   Fax:  [phone]  Occupational Therapy Treatment  Patient Details  Name: Edwin Wall MRN: [id number] Date of Birth: [birth year] Referring Provider (OT): Courtney Heys, MD   Encounter Date: 11/07/2020   OT End of Session - 11/07/20 1519     Visit Number 4    Number of Visits 9    Date for OT Re-Evaluation 11/30/20    Authorization Type Medicare and AARP supplement    Authorization Time Period Follow Medicare Guidelines    Authorization - Visit Number 4    Progress Note Due on Visit 10    OT Start Time 1448    OT Stop Time 1528    OT Time Calculation (min) 40 min    Activity Tolerance Patient tolerated treatment well    Behavior During Therapy [facility] for tasks assessed/performed             Past Medical History:  Diagnosis Date   Anxiety    Arthritis    Cataract    Colon cancer ([place]) dx'd 11/2016   "stage 3"   Gout    Gout    Hemorrhoids    History of kidney stones    "passed it"   Hypertension    Pre-diabetes     Past Surgical History:  Procedure Laterality Date   COLECTOMY  12/17/2016   lap; partial sigmoid colectomy/notes 12/17/2016   COLONOSCOPY W/ BIOPSIES AND POLYPECTOMY  11/11/2016   LAPAROSCOPIC SIGMOID COLECTOMY [name] 12/17/2016   Procedure: LAPAROSCOPIC SIGMOID COLECTOMY;  Surgeon: [name], MD;  Location: [facility];  Service: General;  Laterality: [name];   NASAL SEPTUM SURGERY     TONSILLECTOMY      There were no vitals filed for this visit.   Subjective Assessment - 11/07/20 1518     Subjective  Denies pain    Patient is accompanied by: Family member    Pertinent History PMH significant for DM, HTN, HLD, and CKD Stage IIIB, hx of colon CA    Limitations Fall Risk.    Patient Stated Goals Speech    Currently in Pain? No/denies                   Treatment: Therapist reviewed previously  issued coordination HEP, pt required min-mod v.c and demonstration. Pt demonstrates mod-max difficulty with rotating ball in hand Gripper set at level 2 to pick up 1 inch blocks, for sustained grip, pt fatigued quickly requiring rest breaks, and he only completed 3/4 blocks. Placing grooved pegs into pegboard with RUE for in hand manipulation and fine motor coordination, mod difficulty/ v.c, removing with tweezers for sustained pinch. Graded clothespins for sustained pinch and functional reach with RUE, min v.c Pt was accompanied by his wife today.                 OT Short Term Goals - 10/23/20 1151       OT SHORT TERM GOAL #1   Title Pt will be independent with HEP for RUE grip strength and coordination    Time 3    Period Weeks    Status On-going    Target Date 11/09/20      OT SHORT TERM GOAL #2   Title Pt will perform environmental scanning with 90% accuracy or greater.    Time 3    Period Weeks    Status On-going  OT Long Term Goals - 10/23/20 1152       OT LONG TERM GOAL #1   Title Pt will be independent with updated HEPs    Time 6    Period Weeks    Status On-going      OT LONG TERM GOAL #2   Title Pt will improve grip strength in RUE by 5 lbs or greater for increased use of nondominant hand.    Baseline L 51.5 R 37.6    Time 6    Period Weeks    Status On-going      OT LONG TERM GOAL #3   Title Pt will increase fine motor coordination in RUE by completing 9 hole peg test in 37 seconds or less    Baseline R 43.98s, L 37.25s    Time 6    Period Weeks    Status On-going      OT LONG TERM GOAL #4   Title Pt will perform physical and cognitive task simultaneously with 90% accuracy or greater.    Time 6    Period Weeks    Status On-going                   Plan - 11/07/20 1500     Clinical Impression Statement Pt progressing towards slowly towards  goals. Pt demonstrates decreased insight into deficits  and pt has on  old injury which impacts coordination in [place].    OT Occupational Profile and History Problem Focused Assessment - Including review of records relating to presenting problem    Occupational performance deficits (Please refer to evaluation for details): IADL's;Leisure    Body Structure / Function / Physical Skills ADL;FMC;Coordination;ROM;IADL;UE functional use;Dexterity;GMC;Strength    Cognitive Skills Attention;Safety Awareness    Rehab Potential Good    Clinical Decision Making Limited treatment options, no task modification necessary    OT Frequency 2x / week    OT Duration 6 weeks    OT Treatment/Interventions Self-care/ADL training;DME and/or AE instruction;Functional Mobility Training;Patient/family education;Energy conservation;Therapeutic exercise;Cognitive remediation/compensation;Therapeutic activities;Neuromuscular education;Manual Therapy    Plan review theraputty HEP if pt brings in, continue to work towards goals             Patient will benefit from skilled therapeutic intervention in order to improve the following deficits and impairments:   Body Structure / Function / Physical Skills: ADL, FMC, Coordination, ROM, IADL, UE functional use, Dexterity, GMC, Strength Cognitive Skills: Attention, Safety Awareness     Visit Diagnosis: Attention and concentration deficit  Other lack of coordination  Muscle weakness (generalized)    Problem List Patient Active Problem List   Diagnosis Date Noted   Abnormality of gait 09/10/2020   Leukocytosis    Somnolence    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/2020   Acute right hemiparesis ([place]) 07/19/2020   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 [place], [place], [address] Phone: [phone]   Fax:  [phone]  Name: MARKECE PIZZIMENTI MRN: [id number] Date of Birth: Jun 22, 1940

## 2020-11-09 ENCOUNTER — Ambulatory Visit: Payer: Medicare Other | Admitting: Occupational Therapy

## 2020-11-09 ENCOUNTER — Encounter: Payer: Self-pay | Admitting: Occupational Therapy

## 2020-11-09 ENCOUNTER — Other Ambulatory Visit: Payer: Self-pay

## 2020-11-09 ENCOUNTER — Ambulatory Visit: Payer: Medicare Other

## 2020-11-09 DIAGNOSIS — R41840 Attention and concentration deficit: Secondary | ICD-10-CM

## 2020-11-09 DIAGNOSIS — R2681 Unsteadiness on feet: Secondary | ICD-10-CM

## 2020-11-09 DIAGNOSIS — M6281 Muscle weakness (generalized): Secondary | ICD-10-CM

## 2020-11-09 DIAGNOSIS — R278 Other lack of coordination: Secondary | ICD-10-CM

## 2020-11-09 NOTE — Therapy (Signed)
[facility] [address] [place], [place], [address] Phone: [phone]   Fax:  [phone]  Occupational Therapy Treatment & STG Check All goals are ongoing  Patient Details  Name: Edwin Wall MRN: [id number] Date of Birth: 1940-07-20 Referring Provider (OT): Courtney Heys, MD   Encounter Date: 11/09/2020   OT End of Session - 11/09/20 1534     Visit Number 5    Number of Visits 9    Date for OT Re-Evaluation 11/30/20    Authorization Type Medicare and AARP supplement    Authorization Time Period Follow Medicare Guidelines    Authorization - Visit Number 5    Progress Note Due on Visit 10    OT Start Time 1533    OT Stop Time 1612    OT Time Calculation (min) 39 min    Activity Tolerance Patient tolerated treatment well    Behavior During Therapy [facility] for tasks assessed/performed             Past Medical History:  Diagnosis Date   Anxiety    Arthritis    Cataract    Colon cancer ([place]) dx'd 11/2016   "stage 3"   Gout    Gout    Hemorrhoids    History of kidney stones    "passed it"   Hypertension    Pre-diabetes     Past Surgical History:  Procedure Laterality Date   COLECTOMY  12/17/2016   lap; partial sigmoid colectomy/notes 12/17/2016   COLONOSCOPY W/ BIOPSIES AND POLYPECTOMY  11/11/2016   LAPAROSCOPIC SIGMOID COLECTOMY [name] 12/17/2016   Procedure: LAPAROSCOPIC SIGMOID COLECTOMY;  Surgeon: [name], MD;  Location: [facility];  Service: General;  Laterality: [name];   NASAL SEPTUM SURGERY     TONSILLECTOMY      There were no vitals filed for this visit.   Subjective Assessment - 11/09/20 1533     Subjective  Pt denies any pain. Pt brought his theraputty and ball today. Spouse waited in lobby.    Patient is accompanied by: Family member    Pertinent History PMH significant for DM, HTN, HLD, and CKD Stage IIIB, hx of colon CA    Limitations Fall Risk.    Patient Stated Goals Speech     Currently in Pain? No/denies               Checked Goals  Environmental Scanning with 14/16 - 87.5% accuracy with max cues for finding remaining 2  Nuts and Bolts with increased time and good coordination.                     OT Short Term Goals - 11/09/20 1535       OT SHORT TERM GOAL #1   Title Pt will be independent with HEP for RUE grip strength and coordination    Time 3    Period Weeks    Status On-going   still req'd min-mod cues for HEP for theraputty and coordination   Target Date 11/09/20      OT SHORT TERM GOAL #2   Title Pt will perform environmental scanning with 90% accuracy or greater.    Time 3    Period Weeks    Status On-going   87.5% on 11/09/20              OT Long Term Goals - 11/09/20 1606       OT LONG TERM GOAL #1   Title Pt  will be independent with updated HEPs    Time 6    Period Weeks    Status On-going      OT LONG TERM GOAL #2   Title Pt will improve grip strength in RUE by 5 lbs or greater for increased use of nondominant hand.    Baseline L 51.5 R 37.6    Time 6    Period Weeks    Status On-going   41.2 lbs RUE 11/09/20     OT LONG TERM GOAL #3   Title Pt will increase fine motor coordination in RUE by completing 9 hole peg test in 37 seconds or less    Baseline R 43.98s, L 37.25s    Time 6    Period Weeks    Status On-going   9/922 RUE 43.25s     OT LONG TERM GOAL #4   Title Pt will perform physical and cognitive task simultaneously with 90% accuracy or greater.    Time 6    Period Weeks    Status On-going                   Plan - 11/09/20 1614     Clinical Impression Statement Pt is progressing slowly towards goals. Pt has improved in grip strength and slightly with coordination and attention but has not yet met goals.    OT Occupational Profile and History Problem Focused Assessment - Including review of records relating to presenting problem    Occupational performance deficits  (Please refer to evaluation for details): IADL's;Leisure    Body Structure / Function / Physical Skills ADL;FMC;Coordination;ROM;IADL;UE functional use;Dexterity;GMC;Strength    Cognitive Skills Attention;Safety Awareness    Rehab Potential Good    Clinical Decision Making Limited treatment options, no task modification necessary    OT Frequency 2x / week    OT Duration 6 weeks    OT Treatment/Interventions Self-care/ADL training;DME and/or AE instruction;Functional Mobility Training;Patient/family education;Energy conservation;Therapeutic exercise;Cognitive remediation/compensation;Therapeutic activities;Neuromuscular education;Manual Therapy    Plan continue progressing towards goals.             Patient will benefit from skilled therapeutic intervention in order to improve the following deficits and impairments:   Body Structure / Function / Physical Skills: ADL, FMC, Coordination, ROM, IADL, UE functional use, Dexterity, GMC, Strength Cognitive Skills: Attention, Safety Awareness     Visit Diagnosis: Attention and concentration deficit  Other lack of coordination  Muscle weakness (generalized)  Unsteadiness on feet    Problem List Patient Active Problem List   Diagnosis Date Noted   Abnormality of gait 09/10/2020   Leukocytosis    Somnolence    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/2020   Acute right hemiparesis ([place]) 07/19/2020   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 [place], [place], [address] Phone: [phone]   Fax:  [phone]  Name: JAKARI SADA MRN: [id number] Date of Birth: 1940/04/03

## 2020-11-10 NOTE — Therapy (Addendum)
[facility] [address] [place], [place], [address] Phone: [phone]   Fax:  [phone]  Physical Therapy Treatment  Patient Details  Name: Edwin Wall MRN: [id number] Date of Birth: [birth year] Referring Provider (PT): Dr. Courtney Heys   Encounter Date: 11/09/2020   PT End of Session - 11/09/20 1450     Visit Number 8    Number of Visits 9    Date for PT Re-Evaluation 11/30/20    Authorization Type MCR/AARP    Progress Note Due on Visit 9    PT Start Time 1287    PT Stop Time 8676    PT Time Calculation (min) 45 min    Behavior During Therapy [facility] for tasks assessed/performed             Past Medical History:  Diagnosis Date   Anxiety    Arthritis    Cataract    Colon cancer ([place]) dx'd 11/2016   "stage 3"   Gout    Gout    Hemorrhoids    History of kidney stones    "passed it"   Hypertension    Pre-diabetes     Past Surgical History:  Procedure Laterality Date   COLECTOMY  12/17/2016   lap; partial sigmoid colectomy/notes 12/17/2016   COLONOSCOPY W/ BIOPSIES AND POLYPECTOMY  11/11/2016   LAPAROSCOPIC SIGMOID COLECTOMY [name] 12/17/2016   Procedure: LAPAROSCOPIC SIGMOID COLECTOMY;  Surgeon: [name], MD;  Location: [facility];  Service: General;  Laterality: [name];   NASAL SEPTUM SURGERY     TONSILLECTOMY      There were no vitals filed for this visit.   Subjective Assessment - 11/09/20 1449     Subjective No new c/o no falls or LOB to note    Patient is accompained by: Family member   Spouse [name]   Pertinent History 80 yo male presents to the ED on 5/25 with speech difficutlies and facial droop. 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 PMH including anxiety, arthritis, colon cancer (dx 2018), HTN, obesity, and pre-daibetes.    Patient Stated Goals Return to [facility]               11/09/20  0001  Transfers  Transfers Sit to Stand;Stand to Sit  Sit to Stand 5: Supervision;4: Min guard  Stand to Sit 5: Supervision;4: Min guard  Comments Patient too apprehensive today w/o spouse in attendance  Ambulation/Gait  Ambulation/Gait Yes  Ambulation/Gait Assistance 5: Supervision  Ambulation Distance (Feet) 1000 Feet  Assistive device None  Gait Pattern Step-through pattern;Decreased arm swing - left;Decreased step length - right;Decreased step length - left;Trunk flexed  Ambulation Surface Level;Indoor;Outdoor;Paved;Grass  Knee/Hip Exercises: Aerobic  Other Aerobic Scifit Level 4 with UE/LE's x 8 minutes with goal >/= 80 steps per minute for strengthening and activity tolerance. seat 18, arms 7     11/09/20 0001  Balance Exercises: Standing  Standing Eyes Opened Narrow base of support (BOS);Foam/compliant surface;30 secs  Standing Eyes Opened Limitations 30s hold, 5 head turns/nods  Standing Eyes Closed Narrow base of support (BOS);Foam/compliant surface;30 secs;Limitations  Standing Eyes Closed Limitations required single UE support  SLS with Vectors Limitations;Foam/compliant surface  SLS with Vectors Limitations standing w/o UE support tapping 2 targets unilatera, 2 targets alt. 2 targets alt cross body, 1 rarget alt. cross body. Floor pebbles used.  Step Ups Forward;6 inch;UE support 2;Limitations  Step Ups Limitations 10x ea. LE  PT Short Term Goals - 11/02/20 1330       PT SHORT TERM GOAL #1   Title Patient to become I in [place] TBD; 10/19/20 Able to perform HEP w/o cuing    Time 2    Period Weeks    Status Achieved    Target Date 10/16/20      PT SHORT TERM GOAL #2   Title Patient to demo 4/5 sterngth in BLEs    Baseline 4-/5 B hip and knee flexion; 11/02/20 Goal met    Time 2    Period Weeks    Status Achieved    Target Date 10/16/20      PT SHORT TERM GOAL #3   Title Patient to ambulate  564ft with S and SPC    Baseline 128ft with SBA and SPC; 10/23/20 58ft ambulaiton with SBA, no AD    Time 2    Period Weeks    Status Achieved    Target Date 10/16/20               PT Long Term Goals - 11/09/20 1459       PT LONG TERM GOAL #1   Title Patient to demo final HEP back to PT w/o cuing    Baseline 10/30/20:  met with current program, will benefit from advancement as pt progresses    Status Achieved      PT LONG TERM GOAL #2   Title Patient to ambulate 1071ft with LRAD under S    Baseline 10/30/20: 6030 feet with no device this session before pt requested to sit down to rest, improved just not to goal level; 9/922 1030ft ambulation with CGA due to safety concerns    Status Partially Met      PT LONG TERM GOAL #3   Title Patient to negotiate a full flight of stairs with most appropriate pattern    Baseline 10/30/20: met in session with bil rails with reciprocal pattern    Status Achieved      PT LONG TERM GOAL #4   Title Patient to increase DGI score to 22    Baseline 10/30/20: 20/24 scored today, improved from 18/24 just not to goal level    Status Partially Met                   Plan - 11/09/20 1526     Clinical Impression Statement Patient w/o spouse today as she is waiting outside.  Continued BLE strength and endurance training but patient requiring more cuing to stay focused today.  Unable to reproduce STS activities due to apprehension and required more UE support during stepping tasks.  Able to advance to 6" step for stepping tasks    Personal Factors and Comorbidities Age    Examination-Activity Limitations Locomotion Level;Stairs    Stability/Clinical Decision Making Evolving/Moderate complexity    Rehab Potential Good    PT Frequency 2x / week    PT Duration 4 weeks    PT Treatment/Interventions ADLs/Self Care Home Management;Aquatic Therapy;DME Instruction;Gait training;Stair training;Functional mobility training;Therapeutic  activities;Therapeutic exercise;Balance training;Neuromuscular re-education;Patient/family education    PT Next Visit Plan Continue LE sterngthening, compliant surface balance and stepping tasks, focus on tasks at hand    PT [place] and Agree with Plan of Care Patient;Family member/caregiver    Family Member Consulted spouse             Patient will benefit from skilled  therapeutic intervention in order to improve the following deficits and impairments:  Abnormal gait, Decreased endurance, Increased edema, Decreased activity tolerance, Decreased strength, Decreased balance, Difficulty walking  Visit Diagnosis: Other lack of coordination  Muscle weakness (generalized)  Unsteadiness on feet     Problem List Patient Active Problem List   Diagnosis Date Noted   Abnormality of gait 09/10/2020   Leukocytosis    Somnolence    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/2020   Acute right hemiparesis (HCC) 07/19/2020   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 [place], [place], [address] Phone: [phone]   Fax:  [phone]  Name: Edwin Wall MRN: [id number] Date of Birth: 06/09/1940

## 2020-11-13 ENCOUNTER — Encounter: Payer: Self-pay | Admitting: Occupational Therapy

## 2020-11-13 ENCOUNTER — Other Ambulatory Visit: Payer: Self-pay

## 2020-11-13 ENCOUNTER — Encounter: Payer: Self-pay | Admitting: Physical Therapy

## 2020-11-13 ENCOUNTER — Ambulatory Visit: Payer: Medicare Other | Admitting: Physical Therapy

## 2020-11-13 ENCOUNTER — Ambulatory Visit: Payer: Medicare Other | Admitting: Occupational Therapy

## 2020-11-13 DIAGNOSIS — M6281 Muscle weakness (generalized): Secondary | ICD-10-CM

## 2020-11-13 DIAGNOSIS — R278 Other lack of coordination: Secondary | ICD-10-CM

## 2020-11-13 DIAGNOSIS — R2681 Unsteadiness on feet: Secondary | ICD-10-CM

## 2020-11-13 DIAGNOSIS — R41840 Attention and concentration deficit: Secondary | ICD-10-CM

## 2020-11-13 NOTE — Therapy (Signed)
[facility] [address] [place], [place], [address] Phone: [phone]   Fax:  [phone]  Occupational Therapy Treatment  Patient Details  Name: Edwin Wall MRN: [id number] Date of Birth: 02-07-1941 Referring Provider (OT): Courtney Heys, MD   Encounter Date: 11/13/2020   OT End of Session - 11/13/20 1319     Visit Number 6    Number of Visits 9    Date for OT Re-Evaluation 11/30/20    Authorization Type Medicare and AARP supplement    Authorization Time Period Follow Medicare Guidelines    Authorization - Visit Number 6    Progress Note Due on Visit 10    OT Start Time 1230    OT Stop Time 1315    OT Time Calculation (min) 45 min    Activity Tolerance Patient tolerated treatment well    Behavior During Therapy [facility] for tasks assessed/performed             Past Medical History:  Diagnosis Date   Anxiety    Arthritis    Cataract    Colon cancer ([place]) dx'd 11/2016   "stage 3"   Gout    Gout    Hemorrhoids    History of kidney stones    "passed it"   Hypertension    Pre-diabetes     Past Surgical History:  Procedure Laterality Date   COLECTOMY  12/17/2016   lap; partial sigmoid colectomy/notes 12/17/2016   COLONOSCOPY W/ BIOPSIES AND POLYPECTOMY  11/11/2016   LAPAROSCOPIC SIGMOID COLECTOMY [name] 12/17/2016   Procedure: LAPAROSCOPIC SIGMOID COLECTOMY;  Surgeon: [name], MD;  Location: [facility];  Service: General;  Laterality: [name];   NASAL SEPTUM SURGERY     TONSILLECTOMY      There were no vitals filed for this visit.   Subjective Assessment - 11/13/20 1235     Subjective  I am an early riser - staying up late to watch baseball    Patient is accompanied by: Family member    Pertinent History PMH significant for DM, HTN, HLD, and CKD Stage IIIB, hx of colon CA    Limitations Fall Risk.    Currently in Pain? No/denies                          OT  Treatments/Exercises (OP) - 11/13/20 0001       ADLs   Home Maintenance Reports that he has been helping with household managemen washing dishes, making the bed, etc.  Patient has not been doing outside work per his report.    Medication Management Patient's wife organizes medicines, and patient can state when he takes medicines.    Driving Patient reports that he was instructed to not drive his riding mower, and he is unable to pull start his push mower.  Neighbors have been helping with lawn care.      Exercises   Exercises Theraputty   Reviewed three putty exercises.     Cognitive Exercises   Other Cognitive Exercises 1 Working on organizing and attending.  Sorting cards by suit, and from low to high - then determining what cards are missing.  Initially min cueing - consistently.  WIth overt cue to slow down and for details - patient able to complete accurately.                    OT Education - 11/13/20 1318     Education Details  reviewed putty - working to simplify putty exercises - grasp, roll, pinch    Person(s) Educated Patient    Methods Explanation;Demonstration;Verbal cues;Handout    Comprehension Verbalized understanding;Returned demonstration;Verbal cues required              OT Short Term Goals - 11/13/20 1408       OT SHORT TERM GOAL #1   Title Pt will be independent with HEP for RUE grip strength and coordination    Time 3    Period Weeks    Status On-going   still req'd min-mod cues for HEP for theraputty and coordination   Target Date 11/09/20      OT SHORT TERM GOAL #2   Title Pt will perform environmental scanning with 90% accuracy or greater.    Time 3    Period Weeks    Status On-going   87.5% on 11/09/20              OT Long Term Goals - 11/13/20 1408       OT LONG TERM GOAL #1   Title Pt will be independent with updated HEPs    Time 6    Period Weeks    Status On-going      OT LONG TERM GOAL #2   Title Pt will improve grip  strength in RUE by 5 lbs or greater for increased use of nondominant hand.    Baseline L 51.5 R 37.6    Time 6    Period Weeks    Status On-going   41.2 lbs RUE 11/09/20     OT LONG TERM GOAL #3   Title Pt will increase fine motor coordination in RUE by completing 9 hole peg test in 37 seconds or less    Baseline R 43.98s, L 37.25s    Time 6    Period Weeks    Status On-going   9/922 RUE 43.25s     OT LONG TERM GOAL #4   Title Pt will perform physical and cognitive task simultaneously with 90% accuracy or greater.    Time 6    Period Weeks    Status On-going                   Plan - 11/13/20 1319     Clinical Impression Statement Pt is not regularly completing putty exercises per his report.  Patient limited by cognitive and language impairments.    OT Occupational Profile and History Problem Focused Assessment - Including review of records relating to presenting problem    Occupational performance deficits (Please refer to evaluation for details): IADL's;Leisure    Body Structure / Function / Physical Skills ADL;FMC;Coordination;ROM;IADL;UE functional use;Dexterity;GMC;Strength    Cognitive Skills Attention;Safety Awareness    Rehab Potential Good    Clinical Decision Making Limited treatment options, no task modification necessary    OT Frequency 2x / week    OT Duration 6 weeks    OT Treatment/Interventions Self-care/ADL training;DME and/or AE instruction;Functional Mobility Training;Patient/family education;Energy conservation;Therapeutic exercise;Cognitive remediation/compensation;Therapeutic activities;Neuromuscular education;Manual Therapy    Plan continue progressing towards goals.    Consulted and Agree with Plan of Care Patient             Patient will benefit from skilled therapeutic intervention in order to improve the following deficits and impairments:   Body Structure / Function / Physical Skills: ADL, FMC, Coordination, ROM, IADL, UE functional use,  Dexterity, GMC, Strength Cognitive Skills: Attention, Safety Awareness  Visit Diagnosis: Other lack of coordination  Muscle weakness (generalized)  Unsteadiness on feet  Attention and concentration deficit    Problem List Patient Active Problem List   Diagnosis Date Noted   Abnormality of gait 09/10/2020   Leukocytosis    Somnolence    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/2020   Acute right hemiparesis (HCC) 07/19/2020   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) 07/15/2020   Cancer of sigmoid colon ([place]) 12/17/2016   Obesity (BMI 30-39.9) 01/15/2011   Hypertension 01/15/2011   History of gout 01/15/2011   ED (erectile dysfunction) 01/15/2011    Mariah Milling, OT/L 11/13/2020, 2:09 PM  [facility] [address] [place], [place], [address] Phone: [phone]   Fax:  [phone]  Name: Edwin Wall MRN: [id number] Date of Birth: 21-Oct-1940

## 2020-11-14 NOTE — Therapy (Signed)
[facility] [address] [place], [place], [address] Phone: [phone]   Fax:  [phone]  Physical Therapy Treatment  Patient Details  Name: Edwin Wall MRN: [id number] Date of Birth: [birth year] Referring Provider (PT): Dr. Courtney Heys   Encounter Date: 11/13/2020   PT End of Session - 11/14/20 1842     Visit Number 9    Number of Visits 11    Date for PT Re-Evaluation 11/30/20    Authorization Type MCR/AARP    Authorization Time Period 11/27/20-12/27/20    Progress Note Due on Visit 10    PT Start Time 4401    PT Stop Time 1400    PT Time Calculation (min) 43 min    Equipment Utilized During Treatment Gait belt    Activity Tolerance Patient tolerated treatment well    Behavior During Therapy [facility] for tasks assessed/performed             Past Medical History:  Diagnosis Date   Anxiety    Arthritis    Cataract    Colon cancer ([place]) dx'd 11/2016   "stage 3"   Gout    Gout    Hemorrhoids    History of kidney stones    "passed it"   Hypertension    Pre-diabetes     Past Surgical History:  Procedure Laterality Date   COLECTOMY  12/17/2016   lap; partial sigmoid colectomy/notes 12/17/2016   COLONOSCOPY W/ BIOPSIES AND POLYPECTOMY  11/11/2016   LAPAROSCOPIC SIGMOID COLECTOMY [name] 12/17/2016   Procedure: LAPAROSCOPIC SIGMOID COLECTOMY;  Surgeon: [name], MD;  Location: [facility];  Service: General;  Laterality: [name];   NASAL SEPTUM SURGERY     TONSILLECTOMY      There were no vitals filed for this visit.   Subjective Assessment - 11/13/20 1319     Subjective No changes, no falls.    Patient is accompained by: --    Pertinent History 80 yo male presents to the ED on 5/25 with speech difficutlies and facial droop. 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 PMH including anxiety, arthritis, colon  cancer (dx 2018), HTN, obesity, and pre-daibetes.    Patient Stated Goals Return to PLOF    Currently in Pain? No/denies                               [facility] Adult PT Treatment/Exercise - 11/13/20 1320       Ambulation/Gait   Ambulation/Gait Yes    Ambulation/Gait Assistance 5: Supervision    Ambulation Distance (Feet) 600 Feet   100 x 2; 60 ft x 2   Assistive device None    Gait Pattern Step-through pattern;Decreased arm swing - left;Decreased step length - right;Decreased step length - left;Trunk flexed    Ambulation Surface Level;Indoor    Gait Comments Last lap of gait in clinic was with environmental scanning; pt able to find all 6 sanitizer containers on wall with slowed pace at times for increased scanning; no LOB.      Knee/Hip Exercises: Aerobic   Other Aerobic Scifit Level 4 with 4 extremities x 8 minutes with goal >/= 85 steps per minute for strengthening and activity tolerance. seat 18, arms 8; then 2 minutes, lower extremities only                 Balance Exercises - 11/13/20 1315  Balance Exercises: Standing   SLS with Vectors Solid surface;Intermittent upper extremity assist;Limitations    SLS with Vectors Limitations alt step taps x 10 to 6" step, then to 12" step, 10 reps    Step Ups Forward;6 inch;UE support 2;Limitations    Step Ups Limitations 10x ea. LE    Tandem Gait Forward;Retro;Upper extremity support;3 reps;Limitations    Tandem Gait Limitations At counter, red/blue mat, with UE support and min guard    Retro Gait Foam/compliant surface;3 reps;Limitations    Retro Gait Limitations Forward/back along red/blue mat at counter with min guard    Sidestepping Limitations;3 reps    Sidestepping Limitations At counter, intermittent UE support, occasional cues for pelvis facing counter.    Marching Foam/compliant surface;Upper extremity assist 1;Dynamic;Forwards;5 reps   along counter   Other Standing Exercises ON [employer]ire[employer]:  sit to  stand, 10 reps                  PT Short Term Goals - 11/02/20 1330       PT SHORT TERM GOAL #1   Title Patient to become I in initail HEP    Baseline TBD; 10/19/20 Able to perform HEP w/o cuing    Time 2    Period Weeks    Status Achieved    Target Date 10/16/20      PT SHORT TERM GOAL #2   Title Patient to demo 4/5 sterngth in BLEs    Baseline 4-/5 B hip and knee flexion; 11/02/20 Goal met    Time 2    Period Weeks    Status Achieved    Target Date 10/16/20      PT SHORT TERM GOAL #3   Title Patient to ambulate 578ft with S and SPC    Baseline 155ft with SBA and SPC; 10/23/20 510ft ambulaiton with SBA, no AD    Time 2    Period Weeks    Status Achieved    Target Date 10/16/20               PT Long Term Goals - 11/09/20 1459       PT LONG TERM GOAL #1   Title Patient to demo final HEP back to PT w/o cuing    Baseline 10/30/20:  met with current program, will benefit from advancement as pt progresses    Status Achieved      PT LONG TERM GOAL #2   Title Patient to ambulate 1023ft with LRAD under S    Baseline 10/30/20: 6030 feet with no device this session before pt requested to sit down to rest, improved just not to goal level; 9/922 1068ft ambulation with CGA due to safety concerns    Status Partially Met      PT LONG TERM GOAL #3   Title Patient to negotiate a full flight of stairs with most appropriate pattern    Baseline 10/30/20: met in session with bil rails with reciprocal pattern    Status Achieved      PT LONG TERM GOAL #4   Title Patient to increase DGI score to 22    Baseline 10/30/20: 20/24 scored today, improved from 18/24 just not to goal level    Status Partially Met                   Plan - 11/14/20 1843     Clinical Impression Statement Focus of today's session continues to be balance, strengthening, and gait tasks without assistive device.  No LOB with gait today, including with environmental scanning tasks.  Pt does slow  pace with environmental scanning.  He does perform solid surface balance activities without UE support, then needs to use UE support for dynamic/compliant surface balance tasks.    Personal Factors and Comorbidities Age    Examination-Activity Limitations Locomotion Level;Stairs    Stability/Clinical Decision Making Evolving/Moderate complexity    Rehab Potential Good    PT Frequency 1x / week    PT Duration 2 weeks    PT Treatment/Interventions ADLs/Self Care Home Management;Aquatic Therapy;DME Instruction;Gait training;Stair training;Functional mobility training;Therapeutic activities;Therapeutic exercise;Balance training;Neuromuscular re-education;Patient/family education    PT Next Visit Plan Continue gait, strengthening, compliant surface balance and stepping tasks; 10th visit Progress note due    [place] and Agree with Plan of Care Patient             Patient will benefit from skilled therapeutic intervention in order to improve the following deficits and impairments:  Abnormal gait, Decreased endurance, Increased edema, Decreased activity tolerance, Decreased strength, Decreased balance, Difficulty walking  Visit Diagnosis: Unsteadiness on feet  Muscle weakness (generalized)     Problem List Patient Active Problem List   Diagnosis Date Noted   Abnormality of gait 09/10/2020   Leukocytosis    Somnolence    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/2020   Acute right hemiparesis ([place]) 07/19/2020   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   AKI (acute kidney injury) ([place])    Prediabetes    New onset atrial fibrillation ([place])    Dysphagia    Hyperlipidemia 07/16/2020   Type II diabetes mellitus ([place]) 07/16/2020   Atrial fibrillation ([place]) 07/16/2020   Acute CVA (cerebrovascular accident) ([place]) 07/15/2020   Cancer of sigmoid colon ([place]) 12/17/2016   Obesity (BMI 30-39.9) 01/15/2011   Hypertension 01/15/2011   History of gout 01/15/2011   ED  (erectile dysfunction) 01/15/2011    Dashonda Bonneau W., PT 11/14/2020, 6:47 PM Frazier Butt., PT   [facility] [address] [place], [place], [address] Phone: [phone]   Fax:  [phone]  Name: KEVIS QU MRN: [id number] Date of Birth: 06/22/1940

## 2020-11-14 NOTE — Addendum Note (Signed)
Addended by: Lanice Shirts on: 11/14/2020 10:11 AM   Modules accepted: Orders

## 2020-11-16 ENCOUNTER — Other Ambulatory Visit: Payer: Self-pay

## 2020-11-16 ENCOUNTER — Ambulatory Visit: Payer: Medicare Other | Admitting: Occupational Therapy

## 2020-11-16 ENCOUNTER — Ambulatory Visit: Payer: Medicare Other

## 2020-11-16 ENCOUNTER — Encounter: Payer: Self-pay | Admitting: Occupational Therapy

## 2020-11-16 DIAGNOSIS — M6281 Muscle weakness (generalized): Secondary | ICD-10-CM | POA: Diagnosis not present

## 2020-11-16 DIAGNOSIS — R2681 Unsteadiness on feet: Secondary | ICD-10-CM

## 2020-11-16 DIAGNOSIS — R278 Other lack of coordination: Secondary | ICD-10-CM

## 2020-11-16 DIAGNOSIS — R41840 Attention and concentration deficit: Secondary | ICD-10-CM

## 2020-11-16 NOTE — Patient Instructions (Signed)
Access Code: [email or web address] URL: [email or web address] Date: 11/16/2020 Prepared by: Sharlynn Oliphant  Exercises Side Stepping with Counter Support - 2 x daily - 7 x weekly - 2 sets - 10 reps Heel Toe Raises with Counter Support - 2 x daily - 7 x weekly - 2 sets - 10 reps Sit to Stand with Armchair - 2 x daily - 7 x weekly - 2 sets - 5 reps Tandem Walking with Counter Support - 2 x daily - 7 x weekly - 2 sets - 10 reps Backward Tandem Walking with Counter Support - 2 x daily - 7 x weekly - 2 sets - 10 reps

## 2020-11-16 NOTE — Therapy (Signed)
[facility] [address] [place], [place], [address] Phone: [phone]   Fax:  [phone]  Physical Therapy Treatment  Patient Details  Name: Edwin Wall MRN: [id number] Date of Birth: [birth year] Referring Provider (PT): Dr. Courtney Heys   Encounter Date: 11/16/2020   PT End of Session - 11/16/20 1250     Visit Number 10    Number of Visits 11    Date for PT Re-Evaluation 11/30/20    Authorization Type MCR/AARP    Authorization Time Period 11/27/20-12/27/20    Progress Note Due on Visit 10    PT Start Time 1315    PT Stop Time 1400    PT Time Calculation (min) 45 min    Equipment Utilized During Treatment Gait belt    Activity Tolerance Patient tolerated treatment well    Behavior During Therapy [facility] for tasks assessed/performed             Past Medical History:  Diagnosis Date   Anxiety    Arthritis    Cataract    Colon cancer ([place]) dx'd 11/2016   "stage 3"   Gout    Gout    Hemorrhoids    History of kidney stones    "passed it"   Hypertension    Pre-diabetes     Past Surgical History:  Procedure Laterality Date   COLECTOMY  12/17/2016   lap; partial sigmoid colectomy/notes 12/17/2016   COLONOSCOPY W/ BIOPSIES AND POLYPECTOMY  11/11/2016   LAPAROSCOPIC SIGMOID COLECTOMY [name] 12/17/2016   Procedure: LAPAROSCOPIC SIGMOID COLECTOMY;  Surgeon: [name], MD;  Location: [facility];  Service: General;  Laterality: [name];   NASAL SEPTUM SURGERY     TONSILLECTOMY      There were no vitals filed for this visit.   Subjective Assessment - 11/16/20 1311     Subjective Feels he is doing well and declines ny deficits. Able to transfer, ambulate and negotiate stairs w/o issue.    Patient is accompained by: Family member   Spouse [name]   Pertinent History 80 yo male presents to the ED on 5/25 with speech difficutlies and facial droop. MRI shows acute cortical/subcortical infarct affecting the left  frontal operculum, anterolateral left frontal lobe and left insula. PMH including anxiety, arthritis, colon cancer (dx 2018), HTN, obesity, and pre-daibetes.    Patient Stated Goals Return to PLOF    Currently in Pain? No/denies                               [facility] Adult PT Treatment/Exercise - 11/16/20 0001       Transfers   Transfers Sit to Stand;Stand to Sit    Sit to Stand 5: Supervision    Sit to Stand Details Tactile cues for sequencing    Sit to Stand Details (indicate cue type and reason) no UE support allowed    Stand to Sit 5: Supervision    Comments required VCs and encouragement due to apprehension      Ambulation/Gait   Ambulation/Gait Yes    Ambulation/Gait Assistance 5: Supervision    Ambulation Distance (Feet) 1000 Feet    Assistive device None    Gait Pattern Step-through pattern;Decreased arm swing - left;Decreased step length - right;Decreased step length - left;Trunk flexed    Ambulation Surface Level;Unlevel;Indoor;Outdoor;Grass    Stairs Yes    Stairs Assistance 5: Supervision    Stair Management Technique One rail Right;One  rail Left;Two rails;Alternating pattern    Number of Stairs 16    Height of Stairs 6    Gait Comments Able to negotiate a full flight with both rails as well as a single rail on both sides and maintain an alternating pattern, ambulation included sloped terrain and grassy surfaces, fig 8s performed to assess direction changes      Knee/Hip Exercises: Aerobic   Other Aerobic Scifit Level 4 with 4 extremities x 8 minutes with goal 80-100 steps per minute for strengthening and activity tolerance. seat 18, arms 8                 Balance Exercises - 11/16/20 0001       Balance Exercises: Standing   SLS with Vectors Foam/compliant surface;Limitations    SLS with Vectors Limitations from foam mat, tapping 2 cones unlateral, 2 cones alt., 2 cones cross body unilateral, 2 cones cross body alternating, single cone  cross body w/o need of UE support                  PT Short Term Goals - 11/02/20 1330       PT SHORT TERM GOAL #1   Title Patient to become I in [place] TBD; 10/19/20 Able to perform HEP w/o cuing    Time 2    Period Weeks    Status Achieved    Target Date 10/16/20      PT SHORT TERM GOAL #2   Title Patient to demo 4/5 sterngth in [place] 4-/5 B hip and knee flexion; 11/02/20 Goal met    Time 2    Period Weeks    Status Achieved    Target Date 10/16/20      PT SHORT TERM GOAL #3   Title Patient to ambulate 551f with S and SPC    Baseline 1037fwith SBA and SPC; 10/23/20 50063fmbulaiton with SBA, no AD    Time 2    Period Weeks    Status Achieved    Target Date 10/16/20               PT Long Term Goals - 11/09/20 1459       PT LONG TERM GOAL #1   Title Patient to demo final HEP back to PT w/o cuing    Baseline 10/30/20:  met with current program, will benefit from advancement as pt progresses    Status Achieved      PT LONG TERM GOAL #2   Title Patient to ambulate 1000f81fth LRAD under S    Baseline 10/30/20: 6030 feet with no device this session before pt requested to sit down to rest, improved just not to goal level; 9/922 1000ft25fulation with CGA due to safety concerns    Status Partially Met      PT LONG TERM GOAL #3   Title Patient to negotiate a full flight of stairs with most appropriate pattern    Baseline 10/30/20: met in session with bil rails with reciprocal pattern    Status Achieved      PT LONG TERM GOAL #4   Title Patient to increase DGI score to 22    Baseline 10/30/20: 20/24 scored today, improved from 18/24 just not to goal level    Status Partially Met                   Plan - 11/16/20 1348  Clinical Impression Statement Todays session assessed gait over various surfaces including grass and slopes as well as directional changes with fig 8s, able to negotiate a full flight of stairs with  single rail on either side with alternating pattern.  Advance standing balance tasks by adding cones to tap from foam surface w/o need of UE demonstrating improved accuracy of foot placement.  Able to perform 10 STS transfers w/o UE support today w/o apprehension.  All STGs met, anticipate meeting all LTGs next session    Personal Factors and Comorbidities Age    Examination-Activity Limitations Locomotion Level;Stairs    Stability/Clinical Decision Making Evolving/Moderate complexity    Rehab Potential Good    PT Frequency 1x / week    PT Duration 2 weeks    PT Treatment/Interventions ADLs/Self Care Home Management;Aquatic Therapy;DME Instruction;Gait training;Stair training;Functional mobility training;Therapeutic activities;Therapeutic exercise;Balance training;Neuromuscular re-education;Patient/family education    PT Next Visit Plan Assess LTGs and DC if appropriate    PT Home Exercise Plan [email or web address]    Consulted and Agree with Plan of Care Patient             Patient will benefit from skilled therapeutic intervention in order to improve the following deficits and impairments:  Abnormal gait, Decreased endurance, Increased edema, Decreased activity tolerance, Decreased strength, Decreased balance, Difficulty walking  Visit Diagnosis: Unsteadiness on feet  Muscle weakness (generalized)  Other lack of coordination     Problem List Patient Active Problem List   Diagnosis Date Noted   Abnormality of gait 09/10/2020   Leukocytosis    Somnolence    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/2020   Acute right hemiparesis ([place]) 07/19/2020   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 [place], [place], [address] Phone: [phone]   Fax:  [phone]  Name: ANTE ARREDONDO MRN: [id number] Date of Birth: Aug 24, 1940

## 2020-11-16 NOTE — Therapy (Signed)
[facility] [address] [place], [place], [address] Phone: [phone]   Fax:  [phone]  Occupational Therapy Treatment  Patient Details  Name: Edwin Wall MRN: [id number] Date of Birth: [birth year] Referring Provider (OT): Courtney Heys, MD   Encounter Date: 11/16/2020   OT End of Session - 11/16/20 1234     Visit Number 8    Number of Visits 9    Date for OT Re-Evaluation 11/30/20    Authorization Type Medicare and AARP supplement    Authorization Time Period Follow Medicare Guidelines    Authorization - Visit Number 8    Progress Note Due on Visit 10    OT Start Time 1234    OT Stop Time 1315    OT Time Calculation (min) 41 min    Activity Tolerance Patient tolerated treatment well    Behavior During Therapy [facility] for tasks assessed/performed             Past Medical History:  Diagnosis Date   Anxiety    Arthritis    Cataract    Colon cancer ([place]) dx'd 11/2016   "stage 3"   Gout    Gout    Hemorrhoids    History of kidney stones    "passed it"   Hypertension    Pre-diabetes     Past Surgical History:  Procedure Laterality Date   COLECTOMY  12/17/2016   lap; partial sigmoid colectomy/notes 12/17/2016   COLONOSCOPY W/ BIOPSIES AND POLYPECTOMY  11/11/2016   LAPAROSCOPIC SIGMOID COLECTOMY [name] 12/17/2016   Procedure: LAPAROSCOPIC SIGMOID COLECTOMY;  Surgeon: [name], MD;  Location: [facility];  Service: General;  Laterality: [name];   NASAL SEPTUM SURGERY     TONSILLECTOMY      There were no vitals filed for this visit.   Subjective Assessment - 11/16/20 1234     Subjective  Nothing is new. "maybe" things are getting easier.    Patient is accompanied by: Family member    Pertinent History PMH significant for DM, HTN, HLD, and CKD Stage IIIB, hx of colon CA    Limitations Fall Risk.    Currently in Pain? No/denies                Reviewed Goals  Hand Gripper: with RUE on  level 2 with black spring. Pt picked up 1 inch blocks with gripper with mod drops and min difficulty.  Environmental Scanning 11/12 - 92% accuracy  Constant Therapy Same Symbol level 5 with 88% accuracy and 15.63s response time. Cued for organized scanning for next time - Same Symbol level 7 with 84% accuracy and 42.02s response time. Missed 3 on last one.   Digiflex 7.0lb x 10 RUE                   OT Short Term Goals - 11/16/20 1238       OT SHORT TERM GOAL #1   Title Pt will be independent with HEP for RUE grip strength and coordination    Time 3    Period Weeks    Status Achieved   still req'd min-mod cues for HEP for theraputty and coordination, with assistance of caregiver Independent   Target Date 11/09/20      OT SHORT TERM GOAL #2   Title Pt will perform environmental scanning with 90% accuracy or greater.    Time 3    Period Weeks    Status Achieved   87.5% on 11/09/20,  92% 11/16/20              OT Long Term Goals - 11/16/20 1235       OT LONG TERM GOAL #1   Title Pt will be independent with updated HEPs    Time 6    Period Weeks    Status On-going      OT LONG TERM GOAL #2   Title Pt will improve grip strength in RUE by 5 lbs or greater for increased use of nondominant hand.    Baseline L 51.5 R 37.6    Time 6    Period Weeks    Status On-going   41.2 lbs RUE 11/09/20 and again on 11/16/20     OT LONG TERM GOAL #3   Title Pt will increase fine motor coordination in RUE by completing 9 hole peg test in 37 seconds or less    Baseline R 43.98s, L 37.25s    Time 6    Period Weeks    Status Achieved   9/922 RUE 43.25s, 11/16/20 RUE 36.12s     OT LONG TERM GOAL #4   Title Pt will perform physical and cognitive task simultaneously with 90% accuracy or greater.    Time 6    Period Weeks    Status On-going                   Plan - 11/16/20 1242     Clinical Impression Statement Pt reports using putty "a little bit" at home.    OT  Occupational Profile and History Problem Focused Assessment - Including review of records relating to presenting problem    Occupational performance deficits (Please refer to evaluation for details): IADL's;Leisure    Body Structure / Function / Physical Skills ADL;FMC;Coordination;ROM;IADL;UE functional use;Dexterity;GMC;Strength    Cognitive Skills Attention;Safety Awareness    Rehab Potential Good    Clinical Decision Making Limited treatment options, no task modification necessary    OT Frequency 2x / week    OT Duration 6 weeks    OT Treatment/Interventions Self-care/ADL training;DME and/or AE instruction;Functional Mobility Training;Patient/family education;Energy conservation;Therapeutic exercise;Cognitive remediation/compensation;Therapeutic activities;Neuromuscular education;Manual Therapy    Plan continue progressing towards goals, plan discharge next session? end of POC (spouse hasn't been present past few sessions to review)    Consulted and Agree with Plan of Care Patient             Patient will benefit from skilled therapeutic intervention in order to improve the following deficits and impairments:   Body Structure / Function / Physical Skills: ADL, FMC, Coordination, ROM, IADL, UE functional use, Dexterity, GMC, Strength Cognitive Skills: Attention, Safety Awareness     Visit Diagnosis: Unsteadiness on feet  Muscle weakness (generalized)  Other lack of coordination  Attention and concentration deficit    Problem List Patient Active Problem List   Diagnosis Date Noted   Abnormality of gait 09/10/2020   Leukocytosis    Somnolence    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/2020   Acute right hemiparesis ([place]) 07/19/2020   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   AKI (acute kidney injury) ([place])    Prediabetes    New onset atrial fibrillation ([place])    Dysphagia    Hyperlipidemia 07/16/2020   Type II diabetes mellitus ([place]) 07/16/2020   Atrial fibrillation ([place])  07/16/2020   Acute CVA (cerebrovascular accident) ([place]) 07/15/2020   Cancer of sigmoid colon ([place]) 12/17/2016   Obesity (BMI 30-39.9) 01/15/2011   Hypertension 01/15/2011   History  of gout 01/15/2011   ED (erectile dysfunction) 01/15/2011    Zachery Conch, OT/L 11/16/2020, 1:13 PM  [facility] [address] [place], [place], [address] Phone: [phone]   Fax:  [phone]  Name: Edwin Wall MRN: [id number] Date of Birth: Jul 21, 1940

## 2020-11-20 ENCOUNTER — Encounter: Payer: Medicare Other | Admitting: Occupational Therapy

## 2020-11-21 ENCOUNTER — Ambulatory Visit: Payer: Medicare Other

## 2020-11-21 ENCOUNTER — Encounter: Payer: Self-pay | Admitting: Occupational Therapy

## 2020-11-21 ENCOUNTER — Ambulatory Visit: Payer: Medicare Other | Admitting: Occupational Therapy

## 2020-11-21 ENCOUNTER — Other Ambulatory Visit: Payer: Self-pay

## 2020-11-21 DIAGNOSIS — R41841 Cognitive communication deficit: Secondary | ICD-10-CM

## 2020-11-21 DIAGNOSIS — M6281 Muscle weakness (generalized): Secondary | ICD-10-CM | POA: Diagnosis not present

## 2020-11-21 DIAGNOSIS — R278 Other lack of coordination: Secondary | ICD-10-CM

## 2020-11-21 DIAGNOSIS — R4701 Aphasia: Secondary | ICD-10-CM

## 2020-11-21 DIAGNOSIS — R2681 Unsteadiness on feet: Secondary | ICD-10-CM

## 2020-11-21 DIAGNOSIS — R41840 Attention and concentration deficit: Secondary | ICD-10-CM

## 2020-11-21 NOTE — Therapy (Signed)
[facility] [address] [place], [place], [address] Phone: [phone]   Fax:  [phone]  Speech Language Pathology Treatment/Discharge Summary  Patient Details  Name: Edwin Wall MRN: [id number] Date of Birth: 1940-06-10 Referring Provider (SLP): Courtney Heys, MD   Encounter Date: 11/21/2020   End of Session - 11/21/20 1628     Visit Number 10    Number of Visits 25    Date for SLP Re-Evaluation 12/24/20    SLP Start Time 1233    SLP Stop Time  1315    SLP Time Calculation (min) 42 min    Activity Tolerance Patient tolerated treatment well             Past Medical History:  Diagnosis Date   Anxiety    Arthritis    Cataract    Colon cancer ([place]) dx'd 11/2016   "stage 3"   Gout    Gout    Hemorrhoids    History of kidney stones    "passed it"   Hypertension    Pre-diabetes     Past Surgical History:  Procedure Laterality Date   COLECTOMY  12/17/2016   lap; partial sigmoid colectomy/notes 12/17/2016   COLONOSCOPY W/ BIOPSIES AND POLYPECTOMY  11/11/2016   LAPAROSCOPIC SIGMOID COLECTOMY [name] 12/17/2016   Procedure: LAPAROSCOPIC SIGMOID COLECTOMY;  Surgeon: [name], MD;  Location: [facility];  Service: General;  Laterality: [name];   NASAL SEPTUM SURGERY     TONSILLECTOMY      There were no vitals filed for this visit.  SPEECH THERAPY DISCHARGE SUMMARY  Visits from Start of Care: 10  Current functional level related to goals / functional outcomes: See goals below. Pt exhibited both limited awareness of deficits and when intellectual awareness improved slightly, there was limited participation for ST work at home despite SLP encouragement and explanation for rationale for that work at home. When presented with the option of d/c from [place] or continuing in [place] with the need for consistent work at home between sessions, pt chose d/c with ST follow up in the future PRN. SLP informed pt/wife that  a MD script would be necessary for f/u ST. Pt/wife acknowledged understanding.    Remaining deficits: Mod aphasia, cognitive linguistic deficits   Education / Equipment: Compensations for cognition and for word finding, Agricultural consultant   Patient agrees to discharge. Patient goals were partially met. Patient is being discharged due to the patient's request..      Subjective Assessment - 11/21/20 1238     Subjective "We're doing pretty good at home."    Patient is accompained by: Family member    Currently in Pain? No/denies                   ADULT SLP TREATMENT - 11/21/20 1240       General Information   Behavior/Cognition Doesn't follow directions;Requires cueing;Alert;Cooperative;Pleasant mood      Treatment Provided   Treatment provided Cognitive-Linquistic      Cognitive-Linquistic Treatment   Treatment focused on Aphasia;Cognition    Skilled Treatment Pt endorsed he is not doing much between sessions for therapies. Pt would like to d/c today and then return if he needs to at a later date, stating that family matters make it challenging for him to come to therapy x2/week. Wife would like the date of Dr. Arsenio Katz appt with pt so SLP informed her. SLP worked with pt today with language skills of reading comprehension  and sustained and selective attention skills with a written directions task. Pt performed better than previous session this was targeted however cont'd to demonstrate some deficits in sustained attention as well as comprehension of written material. Pt was given the option of either continuing in ST and having to do homework consistently or of d/c and completing homework as he desires. He would rather d/c today. SLP reviewed verb network strengthening treatment with pt and wife today and provided handout.      Assessment / Recommendations / Plan   Plan --   d/c - pt request     Progression Toward Goals   Progression toward goals --    d/c day - see goal update             SLP Education - 11/21/20 1627     Education Details VNeST, "refresher" will require MD script    Person(s) Educated Patient;Spouse    Methods Explanation    Comprehension Verbalized understanding              SLP Short Term Goals - 11/02/20 1705       SLP SHORT TERM GOAL #1   Title Pt will provide functional 2-3 sentence responses (spoken) to simple stimuli in at least 20 opportunities in 3 sessions    Time 4    Period Weeks    Status Partially Met    Target Date 10/27/20      SLP SHORT TERM GOAL #2   Title In 5 minutes mod complex conversation pt will demonstrate understanding with modified independence (compensations) x3 sessions    Time 6    Period Weeks    Status Not Met    Target Date 11/09/20      SLP SHORT TERM GOAL #3   Title In 5 minutes mod complex conversation pt will demo functional verbal expression with use of language compensations x3 sessions    Time 6    Period Weeks    Status Not Met    Target Date 11/09/20      SLP SHORT TERM GOAL #4   Title Pt will demo knowledge of expressive language errors in 5 minutes conversation with SLP nonverbal cues x3 sessions    Time 6    Period Weeks    Status Not Met    Target Date 11/09/20      SLP SHORT TERM GOAL #5   Title pt will dmeo understanding of functional written material (to-do lists, simple notes, etc) with extra time, in 3 sessions    Time 4    Period Weeks    Status Deferred    Target Date 10/26/20              SLP Long Term Goals - 11/21/20 1631       SLP LONG TERM GOAL #1   Title In 10 minutes mod complex conversation pt will demonstrate understanding with modified independence (compensations) in 3 sessions    Time 8    Period Weeks    Status Not Met      SLP LONG TERM GOAL #2   Title In 10 minutes mod complex conversation pt will demo functional verbal expression with use of language compensations x3 sessions    Time 8    Period Weeks     Status Not Met      SLP LONG TERM GOAL #3   Title Pt will demo knowledge of expressive language errors in 10 minutes conversation with SLP/listener nonverbal cues x3  sessions    Time 8    Period Weeks    Status Not Met      SLP LONG TERM GOAL #4   Title In 15 minutes mod complex conversation pt will demo functional verbal expression with use of language compensations x3 sessions    Time 12    Period Weeks    Status Not Met      SLP LONG TERM GOAL #5   Title In 10 minutes mod complex conversation pt will demonstrate understanding with modified independence (compensations) in 3 sessions    Time 12    Period Weeks    Status Not Met              Plan - 11/21/20 1629     Clinical Impression Statement Edwin "Claiborne Billings" 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 Today, pt showed limited awareness of dysnomias, and limited awareness with other errors on structured tasks during this therapy course. This is largely unchanged over the last 2-3 weeks. His presentation is as such suggesting that there may have been some hidden pre-existing brain changes with language/memory prior to his CVA. Edwin Wall prefers d/c today over continuing ST with consistent homework.    Speech Therapy Frequency 2x / week    Duration 12 weeks    Treatment/Interventions Functional tasks;Compensatory techniques;Multimodal communcation approach;SLP instruction and feedback;Cueing hierarchy;Language facilitation;Cognitive reorganization;Patient/family education;Internal/external aids    Potential to Achieve Goals Good    Consulted and Agree with Plan of Care Patient;Family member/caregiver    Family Member Consulted wife             Patient will benefit from skilled therapeutic intervention in order to improve the following deficits and impairments:   Aphasia  Cognitive communication  deficit    Problem List Patient Active Problem List   Diagnosis Date Noted   Abnormality of gait 09/10/2020   Leukocytosis    Somnolence    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/2020   Acute right hemiparesis ([facility]) 07/19/2020   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 [place], [place], [address] Phone: [phone]   Fax:  [phone]   Name: Edwin Wall MRN: [id number] Date of Birth: 1940-08-14

## 2020-11-21 NOTE — Therapy (Signed)
[facility] [address] [place], [place], [address] Phone: [phone]   Fax:  [phone]  Occupational Therapy Treatment & Discharge  Patient Details  Name: Edwin Wall MRN: [id number] Date of Birth: 29-Mar-1940 Referring Provider (OT): Courtney Heys, MD   Encounter Date: 11/21/2020   OT End of Session - 11/21/20 1320     Visit Number 9    Number of Visits 9    Date for OT Re-Evaluation 11/30/20    Authorization Type Medicare and AARP supplement    Authorization Time Period Follow Medicare Guidelines    Authorization - Visit Number 9    Progress Note Due on Visit 10    OT Start Time 1318    OT Stop Time 1400    OT Time Calculation (min) 42 min    Activity Tolerance Patient tolerated treatment well    Behavior During Therapy [facility] for tasks assessed/performed             Past Medical History:  Diagnosis Date   Anxiety    Arthritis    Cataract    Colon cancer ([place]) dx'd 11/2016   "stage 3"   Gout    Gout    Hemorrhoids    History of kidney stones    "passed it"   Hypertension    Pre-diabetes     Past Surgical History:  Procedure Laterality Date   COLECTOMY  12/17/2016   lap; partial sigmoid colectomy/notes 12/17/2016   COLONOSCOPY W/ BIOPSIES AND POLYPECTOMY  11/11/2016   LAPAROSCOPIC SIGMOID COLECTOMY [name] 12/17/2016   Procedure: LAPAROSCOPIC SIGMOID COLECTOMY;  Surgeon: [name], MD;  Location: [facility];  Service: General;  Laterality: [name];   NASAL SEPTUM SURGERY     TONSILLECTOMY      There were no vitals filed for this visit.   Subjective Assessment - 11/21/20 1320     Subjective  "we're too told to be running around too much"    Patient is accompanied by: Family member    Pertinent History PMH significant for DM, HTN, HLD, and CKD Stage IIIB, hx of colon CA    Limitations Fall Risk.    Currently in Pain? No/denies              Hand Gripper: with RUE on level 2 with  black spring. Pt picked up 1 inch blocks with gripper with mod drops and min difficulty.  Grooved Pegs with RUE with min diffculty, no drops.     OCCUPATIONAL THERAPY DISCHARGE SUMMARY  Visits from Start of Care: 9  Current functional level related to goals / functional outcomes: Pt progressed and met all goals. Improved grip strength and coodination with RUE and increased independence with ADLs and IADLs.   Remaining deficits: Cognitive deficits   Education / Equipment: HEPs   Patient agrees to discharge. Patient goals were met. Patient is being discharged due to meeting the stated rehab goals..                    OT Short Term Goals - 11/16/20 1238       OT SHORT TERM GOAL #1   Title Pt will be independent with HEP for RUE grip strength and coordination    Time 3    Period Weeks    Status Achieved   still req'd min-mod cues for HEP for theraputty and coordination, with assistance of caregiver Independent   Target Date 11/09/20      OT SHORT TERM  GOAL #2   Title Pt will perform environmental scanning with 90% accuracy or greater.    Time 3    Period Weeks    Status Achieved   87.5% on 11/09/20, 92% 11/16/20              OT Long Term Goals - 11/21/20 1321       OT LONG TERM GOAL #1   Title Pt will be independent with updated HEPs    Time 6    Period Weeks    Status Achieved      OT LONG TERM GOAL #2   Title Pt will improve grip strength in RUE by 5 lbs or greater for increased use of nondominant hand.    Baseline L 51.5 R 37.6    Time 6    Period Weeks    Status Achieved   42.5 lbs 11/21/20     OT LONG TERM GOAL #3   Title Pt will increase fine motor coordination in RUE by completing 9 hole peg test in 37 seconds or less    Baseline R 43.98s, L 37.25s    Time 6    Period Weeks    Status Achieved   9/922 RUE 43.25s, 11/16/20 RUE 36.12s     OT LONG TERM GOAL #4   Title Pt will perform physical and cognitive task simultaneously with 90%  accuracy or greater.    Time 6    Period Weeks    Status Achieved   100% accuracy with counting backwards and tossing ball                  Plan - 11/21/20 1432     Clinical Impression Statement Pt has met all goals and is ready for discharge from OT.    OT Occupational Profile and History Problem Focused Assessment - Including review of records relating to presenting problem    Occupational performance deficits (Please refer to evaluation for details): IADL's;Leisure    Body Structure / Function / Physical Skills ADL;FMC;Coordination;ROM;IADL;UE functional use;Dexterity;GMC;Strength    Cognitive Skills Attention;Safety Awareness    Rehab Potential Good    Clinical Decision Making Limited treatment options, no task modification necessary    OT Frequency 2x / week    OT Duration 6 weeks    OT Treatment/Interventions Self-care/ADL training;DME and/or AE instruction;Functional Mobility Training;Patient/family education;Energy conservation;Therapeutic exercise;Cognitive remediation/compensation;Therapeutic activities;Neuromuscular education;Manual Therapy    Plan OT d/c    Consulted and Agree with Plan of Care Patient             Patient will benefit from skilled therapeutic intervention in order to improve the following deficits and impairments:   Body Structure / Function / Physical Skills: ADL, FMC, Coordination, ROM, IADL, UE functional use, Dexterity, GMC, Strength Cognitive Skills: Attention, Safety Awareness     Visit Diagnosis: Unsteadiness on feet  Other lack of coordination  Attention and concentration deficit  Muscle weakness (generalized)    Problem List Patient Active Problem List   Diagnosis Date Noted   Abnormality of gait 09/10/2020   Leukocytosis    Somnolence    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/2020   Acute right hemiparesis ([place]) 07/19/2020   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   AKI (acute kidney injury) ([place])    Prediabetes    New onset  atrial fibrillation ([facility])    Dysphagia    Hyperlipidemia 07/16/2020   Type II diabetes mellitus ([place]) 07/16/2020   Atrial fibrillation ([place]) 07/16/2020   Acute CVA (cerebrovascular  accident) ([place]) 07/15/2020   Cancer of sigmoid colon ([place]) 12/17/2016   Obesity (BMI 30-39.9) 01/15/2011   Hypertension 01/15/2011   History of gout 01/15/2011   ED (erectile dysfunction) 01/15/2011    Zachery Conch, OT/L 11/21/2020, 2:34 PM  [facility] [address] [place], [place], [address] Phone: [phone]   Fax:  [phone]  Name: Edwin Wall MRN: [id number] Date of Birth: Jul 08, 1940

## 2020-11-21 NOTE — Patient Instructions (Signed)
VNeST technique:   1) Choose a common verb from the list provided.   2) Think of 3 people (or "subjects") who could perform the action, or start by thinking of 3 objects the action could be done to. It might be easier to work on one complete set at a time, thinking of the object and person/subject together. The goal is to be as specific as possible with the nouns, so "farmer" would be a better subject than "man" for the verb drive. Write each subject and each object down on the paper in the appropriate column so you'll have 3 triads (subject-verb-object). It's okay to get personal. Family members, friends, and pets make great subjects for VNeST! Just try to vary the responses, so not all the nouns are personal or there is just one type of object. Similarly, try to use many different meanings of the verb if possible.   3) Read each triad of words aloud when you write them down on the lines below the chart. It's not important to conjugate the verb or add any articles to the nouns ("farmer drive tractor"), but it's okay if you do ("the farmer drives the tractor").   4) Select one of the three triads to expand upon. Ask "WHERE" it happens, "WHY" it happens, and "WHEN" it happens. Try to get as specific as possible, and write the responses down on the last line following the triad you chose to expand. Then, read the expanded sentence with the three answers to the questions. Again, the grammar doesn't matter as much as the focus is on connecting the concepts.

## 2020-11-22 ENCOUNTER — Encounter: Payer: Medicare Other | Admitting: Occupational Therapy

## 2020-11-23 ENCOUNTER — Ambulatory Visit: Payer: Medicare Other

## 2020-11-23 ENCOUNTER — Other Ambulatory Visit: Payer: Self-pay

## 2020-11-23 ENCOUNTER — Ambulatory Visit: Payer: Medicare Other | Admitting: Occupational Therapy

## 2020-11-23 DIAGNOSIS — M6281 Muscle weakness (generalized): Secondary | ICD-10-CM | POA: Diagnosis not present

## 2020-11-23 DIAGNOSIS — R278 Other lack of coordination: Secondary | ICD-10-CM

## 2020-11-23 DIAGNOSIS — R2681 Unsteadiness on feet: Secondary | ICD-10-CM

## 2020-11-23 NOTE — Therapy (Signed)
[facility] [address] [place], [place], [address] Phone: [phone]   Fax:  [phone]  Physical Therapy Treatment/DC Summary  Patient Details  Name: Edwin Wall MRN: [id number] Date of Birth: 1940/03/22 Referring Provider (PT): Dr. Courtney Heys   Encounter Date: 11/23/2020   Visits from Start of Care: 10  Current functional level related to goals / functional outcomes: All goals met   Remaining deficits: Mild LE strength deficits with stair climbing   Education / Equipment: HEP  Patient agrees to discharge. Patient goals were met. Patient is being discharged due to being pleased with the current functional level.   PT End of Session - 11/23/20 1431     Visit Number 11    Number of Visits 11    Date for PT Re-Evaluation 11/30/20    Authorization Type MCR/AARP    Authorization Time Period 11/27/20-12/27/20    Progress Note Due on Visit 10    PT Start Time 1400    PT Stop Time 1430    PT Time Calculation (min) 30 min    Equipment Utilized During Treatment Gait belt    Activity Tolerance Patient tolerated treatment well    Behavior During Therapy WFL for tasks assessed/performed             Past Medical History:  Diagnosis Date   Anxiety    Arthritis    Cataract    Colon cancer ([place]) dx'd 11/2016   "stage 3"   Gout    Gout    Hemorrhoids    History of kidney stones    "passed it"   Hypertension    Pre-diabetes     Past Surgical History:  Procedure Laterality Date   COLECTOMY  12/17/2016   lap; partial sigmoid colectomy/notes 12/17/2016   COLONOSCOPY W/ BIOPSIES AND POLYPECTOMY  11/11/2016   LAPAROSCOPIC SIGMOID COLECTOMY [name] 12/17/2016   Procedure: LAPAROSCOPIC SIGMOID COLECTOMY;  Surgeon: [name], MD;  Location: [facility];  Service: General;  Laterality: [name];   NASAL SEPTUM SURGERY     TONSILLECTOMY      There were no vitals filed for this visit.   Subjective  Assessment - 11/23/20 1405     Subjective Reports no issues to note rated his mobility at 95%    Patient is accompained by: Family member   Spouse [name]   Pertinent History 80 yo male presents to the ED on 5/25 with speech difficutlies and facial droop. 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 PMH including anxiety, arthritis, colon cancer (dx 2018), HTN, obesity, and pre-daibetes.    Patient Stated Goals Return to [facility]                [facility] PT Assessment - 11/23/20 0001       Dynamic Gait Index   Level Surface Normal    Change in Gait Speed Normal    Gait with Horizontal Head Turns Normal    Gait with Vertical Head Turns Normal    Gait and Pivot Turn Normal    Step Over Obstacle Normal    Step Around Obstacles Normal    Steps Mild Impairment    Total Score 23                           [facility] Adult PT Treatment/Exercise - 11/23/20 0001       Transfers   Transfers  Sit to Stand;Stand to Sit    Sit to Stand 6: Modified independent (Device/Increase time)    Stand to Sit 6: Modified independent (Device/Increase time)      Ambulation/Gait   Ambulation/Gait Yes    Ambulation/Gait Assistance 6: Modified independent (Device/Increase time)    Ambulation Distance (Feet) 1000 Feet    Assistive device None    Gait Pattern Step-through pattern;Decreased arm swing - left;Decreased step length - right;Decreased step length - left;Trunk flexed    Ambulation Surface Level;Unlevel;Indoor;Outdoor;Grass    Stairs Yes    Stairs Assistance 6: Modified independent (Device/Increase time)    Stair Management Technique Two rails    Number of Stairs 4    Height of Stairs 6    Gait Comments VCs to heel strike and avoid heel scuff                       PT Short Term Goals - 11/02/20 1330       PT SHORT TERM GOAL #1   Title Patient to become I in [place]  TBD; 10/19/20 Able to perform HEP w/o cuing    Time 2    Period Weeks    Status Achieved    Target Date 10/16/20      PT SHORT TERM GOAL #2   Title Patient to demo 4/5 sterngth in BLEs    Baseline 4-/5 B hip and knee flexion; 11/02/20 Goal met    Time 2    Period Weeks    Status Achieved    Target Date 10/16/20      PT SHORT TERM GOAL #3   Title Patient to ambulate 57f with S and SPC    Baseline 1067fwith SBA and SPC; 10/23/20 50066fmbulaiton with SBA, no AD    Time 2    Period Weeks    Status Achieved    Target Date 10/16/20               PT Long Term Goals - 11/23/20 1407       PT LONG TERM GOAL #1   Title Patient to demo final HEP back to PT w/o cuing    Baseline 10/30/20:  met with current program, will benefit from advancement as pt progresses    Status Achieved      PT LONG TERM GOAL #2   Title Patient to ambulate 1000f64fth LRAD under S    Baseline 10/30/20: 6030 feet with no device this session before pt requested to sit down to rest, improved just not to goal level; 9/922 1000ft61fulation with CGA due to safety concerns; 11/23/20 Able to ambulate 1000ft 4fAD under S with cues to heelstrike first and avoid heel scuff    Status Achieved      PT LONG TERM GOAL #3   Title Patient to negotiate a full flight of stairs with most appropriate pattern    Baseline 10/30/20: met in session with bil rails with reciprocal pattern    Status Achieved      PT LONG TERM GOAL #4   Title Patient to increase DGI score to 22    Baseline 10/30/20: 20/24 scored today, improved from 18/24 just not to goal level; 11/23/20 DGI score 23/24    Status Achieved                   Plan - 11/23/20 1428     Clinical Impression Statement All rehab goals  assessed and met at htis time, patient has returned to a high level of mobility with minimal fall risk.  Patient is ready for independent management.    Personal Factors and Comorbidities Age    Examination-Activity Limitations  Locomotion Level;Stairs    Stability/Clinical Decision Making Evolving/Moderate complexity    Rehab Potential Good    PT Frequency 1x / week    PT Duration 2 weeks    PT Treatment/Interventions ADLs/Self Care Home Management;Aquatic Therapy;DME Instruction;Gait training;Stair training;Functional mobility training;Therapeutic activities;Therapeutic exercise;Balance training;Neuromuscular re-education;Patient/family education    PT Next Visit [place] and Agree with Plan of Care Patient             Patient will benefit from skilled therapeutic intervention in order to improve the following deficits and impairments:  Abnormal gait, Decreased endurance, Increased edema, Decreased activity tolerance, Decreased strength, Decreased balance, Difficulty walking  Visit Diagnosis: Unsteadiness on feet  Other lack of coordination  Muscle weakness (generalized)     Problem List Patient Active Problem List   Diagnosis Date Noted   Abnormality of gait 09/10/2020   Leukocytosis    Somnolence    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/2020   Acute right hemiparesis ([place]) 07/19/2020   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 [place], [place], [address] Phone: [phone]   Fax:  [phone]  Name: Edwin Wall MRN: [id number] Date of Birth: 10/28/1940

## 2020-11-24 ENCOUNTER — Emergency Department (HOSPITAL_COMMUNITY)
Admission: EM | Admit: 2020-11-24 | Discharge: 2020-11-24 | Disposition: A | Discharge status: Home / Self Care | Payer: Medicare Other | Attending: Emergency Medicine | Admitting: Emergency Medicine

## 2020-11-24 ENCOUNTER — Encounter (HOSPITAL_COMMUNITY): Payer: Self-pay | Admitting: Emergency Medicine

## 2020-11-24 ENCOUNTER — Emergency Department (HOSPITAL_COMMUNITY): Payer: Medicare Other

## 2020-11-24 DIAGNOSIS — I10 Essential (primary) hypertension: Secondary | ICD-10-CM | POA: Diagnosis not present

## 2020-11-24 DIAGNOSIS — Z7901 Long term (current) use of anticoagulants: Secondary | ICD-10-CM | POA: Insufficient documentation

## 2020-11-24 DIAGNOSIS — Z7984 Long term (current) use of oral hypoglycemic drugs: Secondary | ICD-10-CM | POA: Diagnosis not present

## 2020-11-24 DIAGNOSIS — E119 Type 2 diabetes mellitus without complications: Secondary | ICD-10-CM | POA: Insufficient documentation

## 2020-11-24 DIAGNOSIS — Z79899 Other long term (current) drug therapy: Secondary | ICD-10-CM | POA: Insufficient documentation

## 2020-11-24 DIAGNOSIS — R531 Weakness: Secondary | ICD-10-CM | POA: Diagnosis present

## 2020-11-24 DIAGNOSIS — Z85038 Personal history of other malignant neoplasm of large intestine: Secondary | ICD-10-CM | POA: Insufficient documentation

## 2020-11-24 DIAGNOSIS — E876 Hypokalemia: Secondary | ICD-10-CM

## 2020-11-24 LAB — CBC WITH DIFFERENTIAL/PLATELET
Abs Immature Granulocytes: 0.02 10*3/uL (ref 0.00–0.07)
Basophils Absolute: 0.1 10*3/uL (ref 0.0–0.1)
Basophils Relative: 1 %
Eosinophils Absolute: 0.1 10*3/uL (ref 0.0–0.5)
Eosinophils Relative: 1 %
HCT: 40.6 % (ref 39.0–52.0)
Hemoglobin: 13.1 g/dL (ref 13.0–17.0)
Immature Granulocytes: 0 %
Lymphocytes Relative: 21 %
Lymphs Abs: 1.4 10*3/uL (ref 0.7–4.0)
MCH: 31 pg (ref 26.0–34.0)
MCHC: 32.3 g/dL (ref 30.0–36.0)
MCV: 96 fL (ref 80.0–100.0)
Monocytes Absolute: 0.5 10*3/uL (ref 0.1–1.0)
Monocytes Relative: 8 %
Neutro Abs: 4.6 10*3/uL (ref 1.7–7.7)
Neutrophils Relative %: 69 %
Platelets: 181 10*3/uL (ref 150–400)
RBC: 4.23 MIL/uL (ref 4.22–5.81)
RDW: 13.8 % (ref 11.5–15.5)
WBC: 6.6 10*3/uL (ref 4.0–10.5)
nRBC: 0 % (ref 0.0–0.2)

## 2020-11-24 LAB — COMPREHENSIVE METABOLIC PANEL
ALT: 15 U/L (ref 0–44)
AST: 17 U/L (ref 15–41)
Albumin: 2.7 g/dL — ABNORMAL LOW (ref 3.5–5.0)
Alkaline Phosphatase: 70 U/L (ref 38–126)
Anion gap: 9 (ref 5–15)
BUN: 11 mg/dL (ref 8–23)
CO2: 25 mmol/L (ref 22–32)
Calcium: 8.8 mg/dL — ABNORMAL LOW (ref 8.9–10.3)
Chloride: 106 mmol/L (ref 98–111)
Creatinine, Ser: 1.56 mg/dL — ABNORMAL HIGH (ref 0.61–1.24)
GFR, Estimated: 45 mL/min — ABNORMAL LOW (ref >60)
Glucose, Bld: 125 mg/dL — ABNORMAL HIGH (ref 70–99)
Potassium: 2.9 mmol/L — ABNORMAL LOW (ref 3.5–5.1)
Sodium: 140 mmol/L (ref 135–145)
Total Bilirubin: 1.6 mg/dL — ABNORMAL HIGH (ref 0.3–1.2)
Total Protein: 5.3 g/dL — ABNORMAL LOW (ref 6.5–8.1)

## 2020-11-24 LAB — TROPONIN I (HIGH SENSITIVITY)
Troponin I (High Sensitivity): 23 ng/L — ABNORMAL HIGH (ref <18)
Troponin I (High Sensitivity): 25 ng/L — ABNORMAL HIGH (ref <18)

## 2020-11-24 IMAGING — DX DG CHEST 1V PORT
1 series · 1 of 1 positions shown · non-contrast
Comparison: [DATE]

CLINICAL DATA: Dyspnea

EXAM:
PORTABLE CHEST 1 VIEW

[chest]
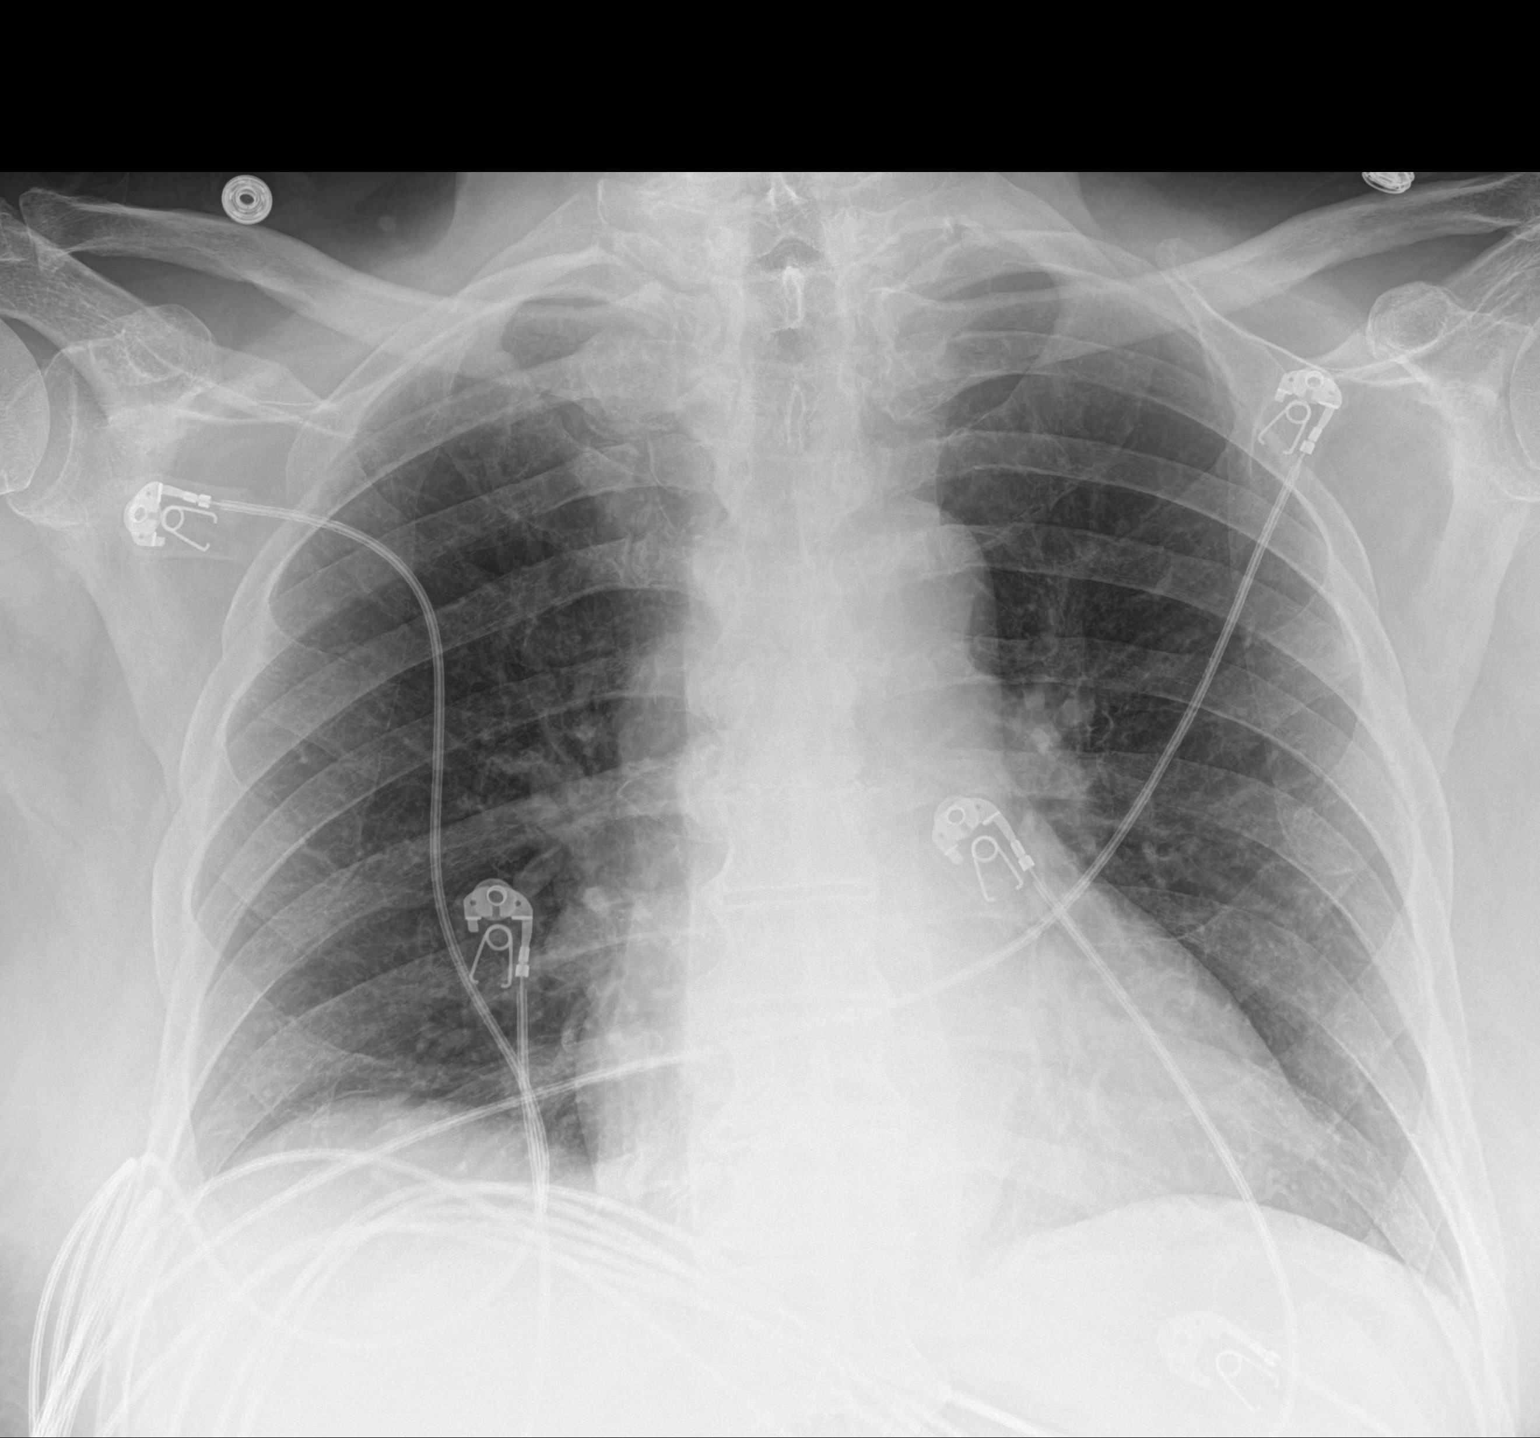

[1 of 1 positions shown; findings below may reference images not displayed]

FINDINGS: The heart size and mediastinal contours are within normal limits.
Decreased lung volumes. Both lungs are clear. The visualized
skeletal structures are unremarkable.
IMPRESSION: Low lung volumes.  No acute findings.

## 2020-11-24 IMAGING — CT CT HEAD W/O CM
4 series · 16 of 47 positions shown, 18 images · non-contrast
Comparison: [DATE]

CLINICAL DATA: Pt ALEXANDRA with report of weakness, report per
wife that pt woke up at [OB] "screaming with arm pain." Pt aox3,
denies arm pain, c/o headache, unable to determine location. ED
Provider at bedside.ALEXANDRA negative, bilaterally equa.*comment was
truncated*

EXAM:
CT HEAD WITHOUT CONTRAST
TECHNIQUE: Contiguous axial images were obtained from the base of the skull
through the vertex without intravenous contrast.

[Series 3: head wo · axial · 0.47mm/px · z∈[-196,-76]mm · 7 of 34 slices shown, 9 images]
[im 5/34  brain]
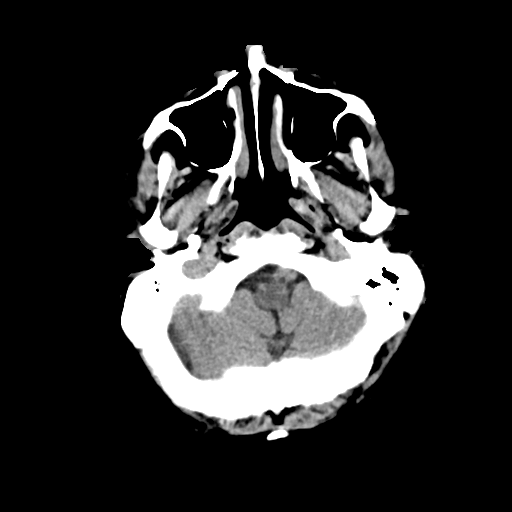
[im 5/34  bone]
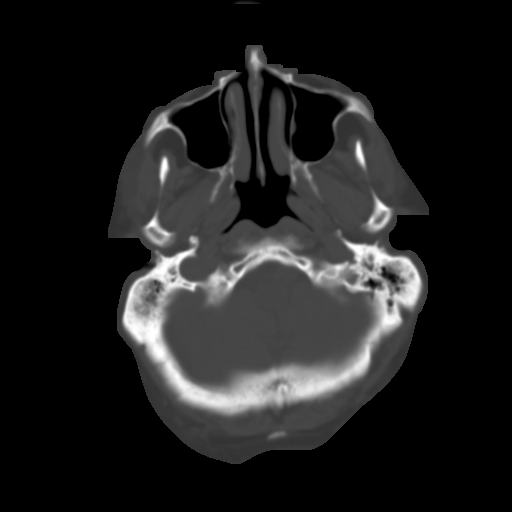
[im 9/34  brain]
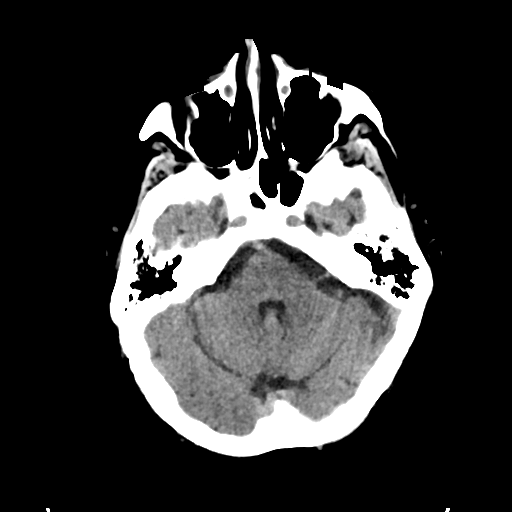
[im 13/34  brain]
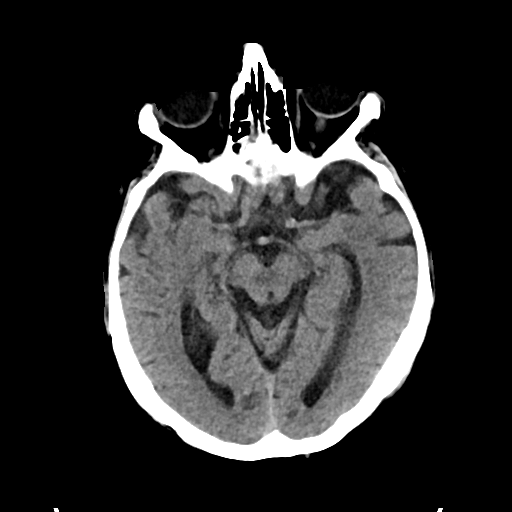
[im 17/34  brain]
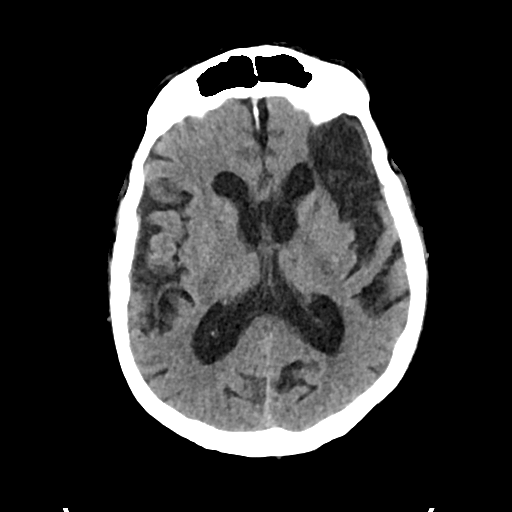
[im 21/34  brain]
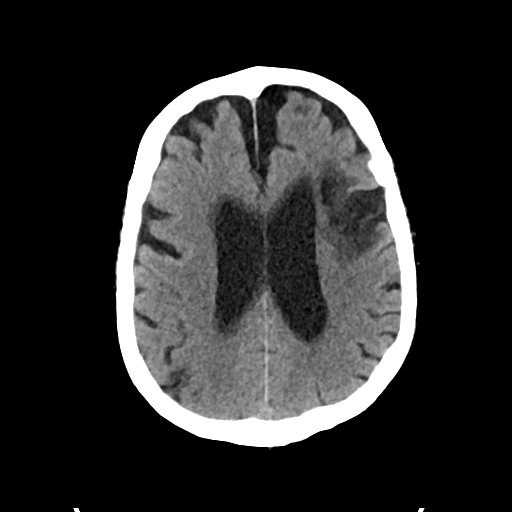
[im 21/34  bone]
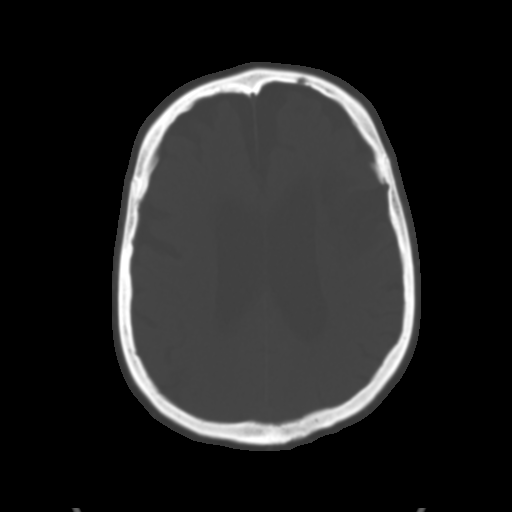
[im 25/34  brain]
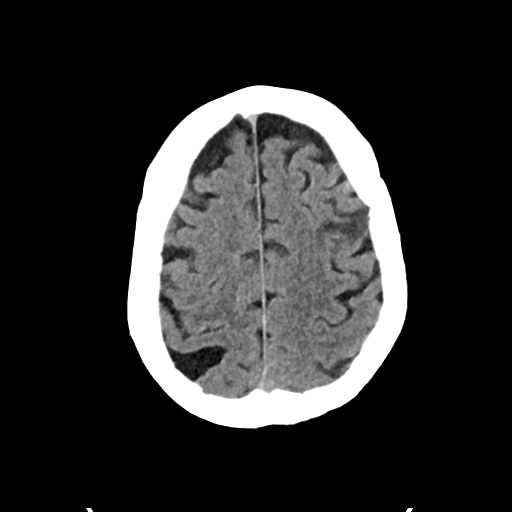
[im 29/34  brain]
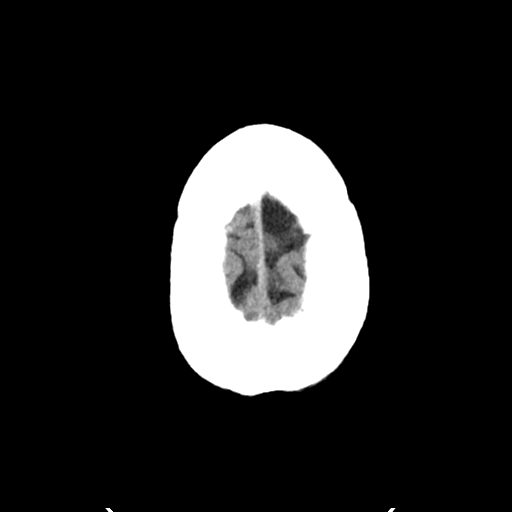

[Series 4: head bone · axial · 0.47mm/px · z∈[-200,-168]mm · 3 of 84 slices shown]
[im 9/84  bone]
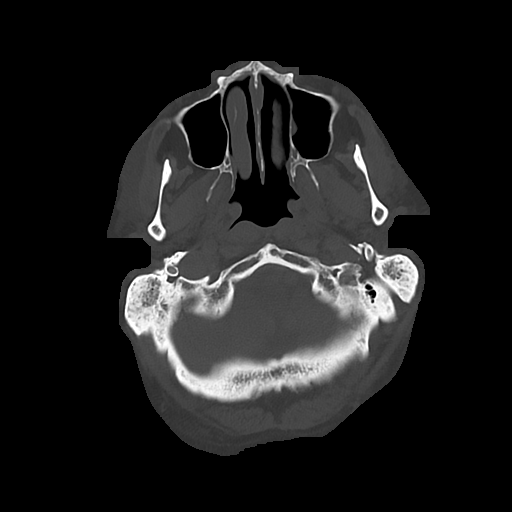
[im 17/84  bone]
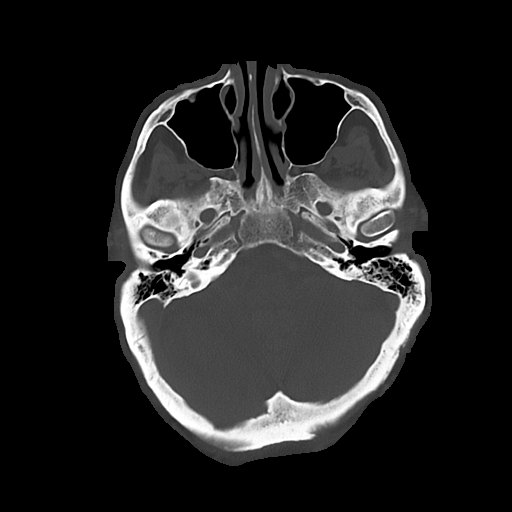
[im 25/84  bone]
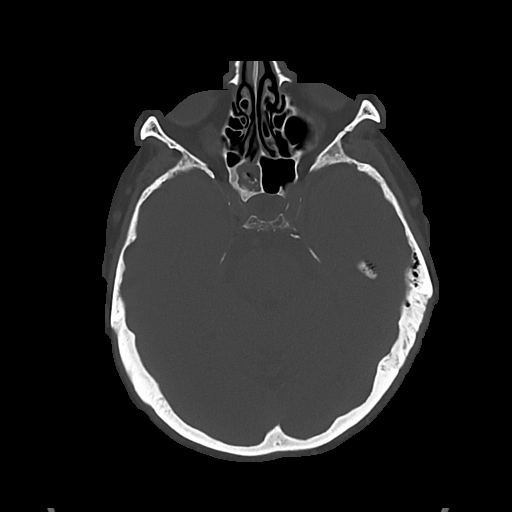

[Series 5: cor soft · coronal · 0.33mm/px · 3 of 76 slices shown]
[im 29/76  brain]
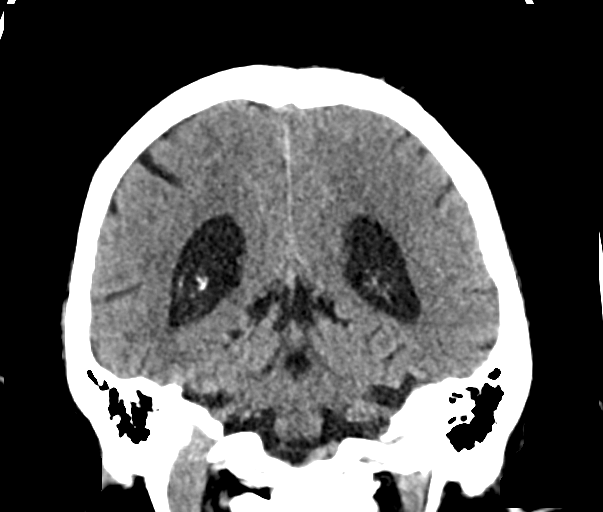
[im 35/76  brain]
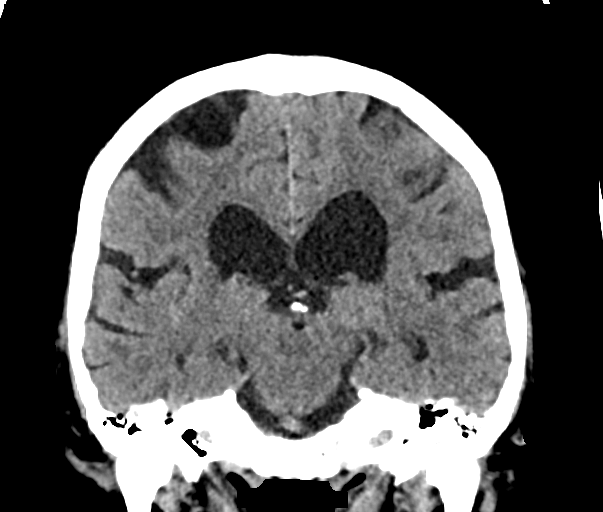
[im 41/76  brain]
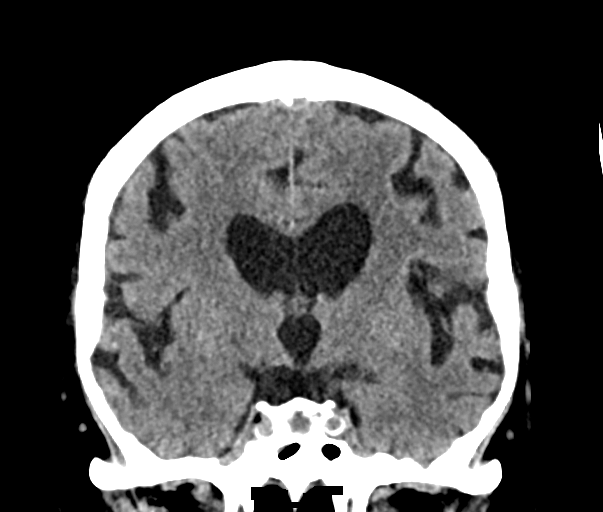

[Series 6: sag soft · sagittal · 0.33mm/px · 3 of 67 slices shown]
[im 23/67  brain]
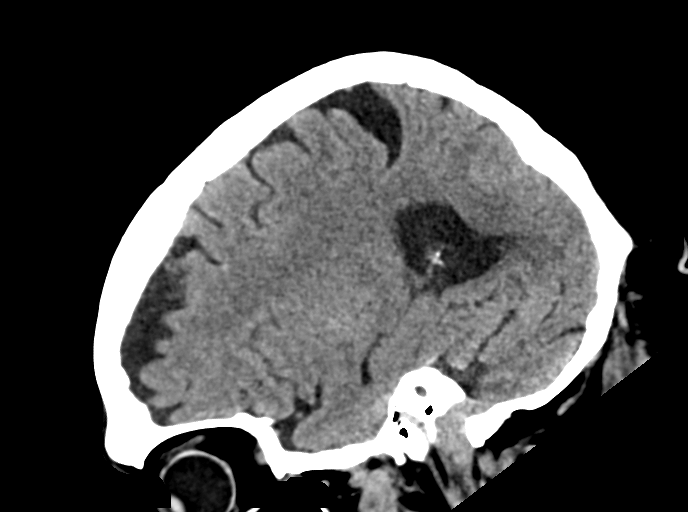
[im 34/67  brain]
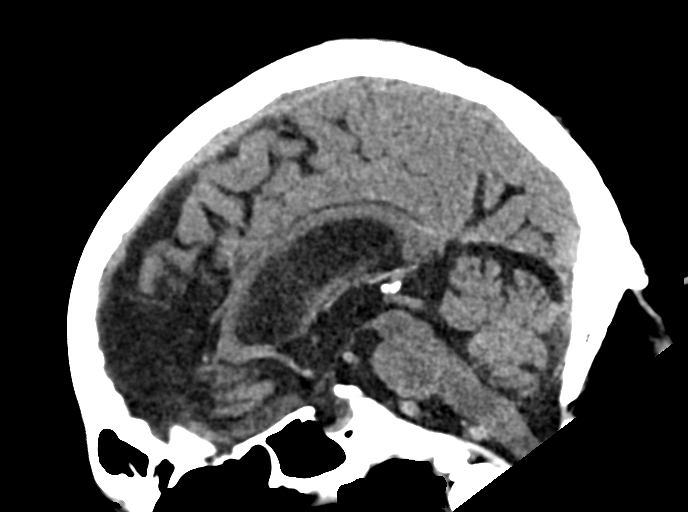
[im 45/67  brain]
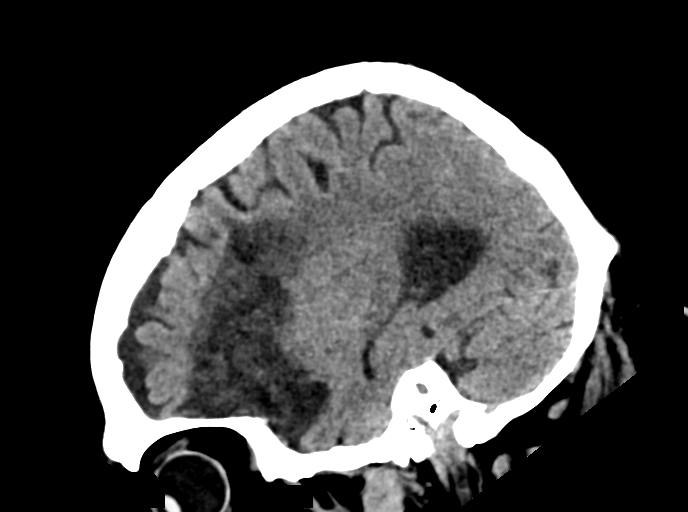

[16 of 47 positions shown; findings below may reference images not displayed]

FINDINGS: Brain: No evidence of acute infarction, hemorrhage, hydrocephalus,
extra-axial collection or mass lesion/mass effect.

Encephalopathy malacia on the left from an old left MCA distribution
infarct, stable. Stable ventricular and sulcal enlargement
consistent with mild to moderate volume loss.

Vascular: No hyperdense vessel or unexpected calcification.

Skull: Normal. Negative for fracture or focal lesion.

Sinuses/Orbits: Globes and orbits are unremarkable. Chronic partial
opacification of the right sphenoid sinus. Remaining sinuses are
clear.

Other: None.
IMPRESSION: 1. No acute intracranial abnormalities.
2. Stable left MCA distribution infarct.

## 2020-11-24 MED ORDER — POTASSIUM CHLORIDE CRYS ER 20 MEQ PO TBCR
40.0000 meq | EXTENDED_RELEASE_TABLET | Freq: Once | ORAL | Status: AC
  Administered 2020-11-24: 40 meq via ORAL
  Filled 2020-11-24: qty 2

## 2020-11-24 NOTE — ED Notes (Signed)
Help get patient on the monitor did ekg shown to er provider patient is resting with nurse at bedside  

## 2020-11-24 NOTE — ED Notes (Signed)
ED Provider at bedside. 

## 2020-11-24 NOTE — ED Triage Notes (Signed)
Pt BIB [facility] with report of weakness, report per wife that pt woke up at 0655 "screaming with arm pain." Pt aox3, denies arm pain, c/o headache, unable to determine location. ED Provider at bedside.VAN negative, bilaterally equal. PT denies shob, denies CP

## 2020-11-24 NOTE — ED Notes (Signed)
Patient transported to CT 

## 2020-11-24 NOTE — ED Notes (Signed)
Family at bedside. 

## 2020-11-24 NOTE — ED Notes (Signed)
Pt discharged to home. Discharge instructions have been discussed with patient and/or family members. Pt verbally acknowledges understanding d/c instructions, and endorses comprehension to checkout at registration before leaving.  °

## 2020-11-24 NOTE — ED Notes (Signed)
Specimen collection device provided to patient.

## 2020-11-24 NOTE — Discharge Instructions (Addendum)
Return for any problem.  Your potassium was low today.  This was treated in the ED.  You should have a repeat potassium check within the next 1 to 2 weeks with your regular care provider.

## 2020-11-24 NOTE — ED Provider Notes (Signed)
[name] Provider Note   CSN: [id number] Arrival date & time: 11/24/20  0750     History Chief Complaint  Patient presents with   Weakness    Edwin Wall is a 80 y.o. male.  80 year old male with prior medical history as detailed below presents for evaluation.  Patient arrives by EMS.  He is followed shortly thereafter by his wife.  His wife provides the majority of history.  The patient apparently was asleep this morning in his bed.  He awoke suddenly from sleep and raised his right arm.  He then seemed to be uncomfortable and was screaming.  This lasted at most a minute.  Then he calmed down and appeared to be asleep. His wife reports that he was difficult to arouse at this point.  The patient's wife reports that by the time EMS arrived the patient was awake.  He was acting his normal self.  He is currently comfortable.  He is without specific complaint.  The patient specifically denies current pain, nausea, dyspnea, or other complaint.  The history is provided by the patient, a relative and the EMS personnel.  Illness Location:  Sudden awakening from sleep.  Apparent arm discomfort.  Disorientation. 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) 12/17/2016   Obesity (BMI 30-39.9) 01/15/2011   Hypertension 01/15/2011   History of gout 01/15/2011   ED (erectile dysfunction) 01/15/2011    Past Surgical History:  Procedure Laterality Date   COLECTOMY  12/17/2016   lap; partial sigmoid colectomy/notes 12/17/2016   COLONOSCOPY W/ BIOPSIES AND POLYPECTOMY  11/11/2016   LAPAROSCOPIC SIGMOID COLECTOMY [name] 12/17/2016   Procedure: LAPAROSCOPIC SIGMOID COLECTOMY;  Surgeon: [name], MD;  Location: [facility] OR;  Service: General;  Laterality: [name];   NASAL SEPTUM SURGERY     TONSILLECTOMY         Family History  Problem Relation Age of Onset   Cancer Mother        LUNG   COPD Father     Social History   Tobacco Use   Smoking status: Never   Smokeless tobacco: Never  Vaping Use   Vaping Use: Never used  Substance Use Topics   Alcohol use: No   Drug use: No    Home Medications Prior to Admission medications   Medication Sig Start Date End Date Taking? Authorizing Provider  amantadine (SYMMETREL) 100 MG capsule Take 2 capsules (200 mg total) by  mouth daily. 08/02/20   Love, Ivan Anchors, PA-C  apixaban (ELIQUIS) 5 MG TABS tablet Take 1 tablet (5 mg total) by mouth 2 (two) times daily. 08/01/20   Love, Ivan Anchors, PA-C  colestipol (COLESTID) 1 g tablet Take 1 tablet (1 g total) by mouth 2 (two) times daily. 08/01/20   Love, Ivan Anchors, PA-C  dapagliflozin propanediol (FARXIGA) 10 MG TABS tablet Take 1 tablet (10 mg total) by mouth daily. 08/02/20   Love, Ivan Anchors, PA-C  LORazepam (ATIVAN) 1 MG tablet Take 1 tablet (1 mg total) by mouth daily as needed for anxiety. 08/01/20   Love, Ivan Anchors, PA-C  rosuvastatin (CRESTOR) 40 MG tablet Take 1 tablet (40 mg total) by mouth daily. 08/01/20   Love, Ivan Anchors, PA-C  senna-docusate (SENOKOT-S) 8.6-50 MG tablet Take 1 tablet by mouth 2 (two) times  daily. 08/01/20   Bary Leriche, PA-C    Allergies    Patient has no known allergies.  Review of Systems   Review of Systems  All other systems reviewed and are negative.  Physical Exam Updated Vital Signs BP 130/89 (BP Location: Right Arm) Comment: Simultaneous filing. User may not have seen previous data.  Pulse 87   Temp 98.6 F (37 C) (Oral)   Resp 10 Comment: Simultaneous filing. User may not have seen previous data.  Ht 5\' 8"  (1.727 m)   Wt 97.1 kg   SpO2 95% Comment: Simultaneous filing. User may not have seen previous data.  BMI 32.54 kg/m   Physical Exam Vitals and nursing note reviewed.  Constitutional:      General: He is not in acute distress.    Appearance: Normal appearance. He is well-developed.  HENT:     Head: Normocephalic and atraumatic.  Eyes:     Conjunctiva/sclera: Conjunctivae normal.     Pupils: Pupils are equal, round, and reactive to light.  Cardiovascular:     Rate and Rhythm: Normal rate and regular rhythm.     Heart sounds: Normal heart sounds.  Pulmonary:     Effort: Pulmonary effort is normal. No respiratory distress.     Breath sounds: Normal breath sounds.  Abdominal:     General: There is no distension.     Palpations: Abdomen is soft.     Tenderness: There is no abdominal tenderness.  Musculoskeletal:        General: No deformity. Normal range of motion.     Cervical back: Normal range of motion and neck supple.  Skin:    General: Skin is warm and dry.  Neurological:     General: No focal deficit present.     Mental Status: He is alert and oriented to person, place, and time. Mental status is at baseline.     Cranial Nerves: No cranial nerve deficit.     Sensory: No sensory deficit.     Motor: No weakness.     Coordination: Coordination normal.     Comments: AO x3  No facial droop  Normal speech  LVO screen is negative  5 out of 5 strength in both upper and lower extremities    ED Results / Procedures / Treatments    Labs (all labs ordered are listed, but only abnormal results are displayed) Labs Reviewed  COMPREHENSIVE METABOLIC PANEL  CBC WITH DIFFERENTIAL/PLATELET  TROPONIN I (HIGH SENSITIVITY)    EKG EKG Interpretation  Date/Time:  Saturday November 24 2020 08:03:10 EDT Ventricular Rate:  88 PR Interval:    QRS Duration: 143  QT Interval:  415 QTC Calculation: 503 R Axis:   -76 Text Interpretation: Atrial fibrillation RBBB and LAFB Confirmed by Dene Gentry [id number]) on 11/24/2020 8:08:25 AM  Radiology No results found.  Procedures Procedures   Medications Ordered in ED Medications - No data to display  ED Course  I have reviewed the triage vital signs and the nursing notes.  Pertinent labs & imaging results that were available during my care of the patient were reviewed by me and considered in my medical decision making (see chart for details).    MDM Rules/Calculators/A&P                           MDM  MSE complete  JOHNANTHONY WILDEN was evaluated in Emergency Department on 11/24/2020 for the symptoms described in the history of present illness. He was evaluated in the context of the global COVID-19 pandemic, which necessitated consideration that the patient might be at risk for infection with the SARS-CoV-2 virus that causes COVID-19. Institutional protocols and algorithms that pertain to the evaluation of patients at risk for COVID-19 are in a state of rapid change based on information released by regulatory bodies including the CDC and federal and state organizations. These policies and algorithms were followed during the patient's care in the ED.  Patient presents after transient episode where he seemed to be briefly uncomfortable as he woke from sleep.  Patient without noted neurologic change or abnormality.  Patient without complaint of pain.  Patient's mental status has been at baseline the entire visit.  He has been without discomfort during his entire  visit.  Patient and patient's wife are reassured by today's work-up.  No specific acute pathology, other than mild hypokalemia, was identified.  Patient and his wife desire discharge home.  They do understand need for close follow-up.  Final Clinical Impression(s) / ED Diagnoses Final diagnoses:  Hypokalemia    Rx / DC Orders ED Discharge Orders     None        Valarie Merino, MD 11/24/20 1407

## 2020-11-27 ENCOUNTER — Ambulatory Visit: Payer: Medicare Other

## 2020-11-27 ENCOUNTER — Encounter: Payer: Medicare Other | Admitting: Occupational Therapy

## 2020-11-29 ENCOUNTER — Encounter: Payer: Medicare Other | Admitting: Occupational Therapy

## 2020-11-29 ENCOUNTER — Ambulatory Visit: Payer: Medicare Other

## 2020-12-14 ENCOUNTER — Encounter
Payer: Medicare Other | Attending: Physical Medicine and Rehabilitation | Admitting: Physical Medicine and Rehabilitation

## 2020-12-14 ENCOUNTER — Encounter: Payer: Self-pay | Admitting: Physical Medicine and Rehabilitation

## 2020-12-14 ENCOUNTER — Other Ambulatory Visit: Payer: Self-pay

## 2020-12-14 VITALS — BP 124/80 | HR 84 | Temp 97.9°F | Ht 68.0 in | Wt 207.8 lb

## 2020-12-14 DIAGNOSIS — R3915 Urgency of urination: Secondary | ICD-10-CM | POA: Insufficient documentation

## 2020-12-14 DIAGNOSIS — I63512 Cerebral infarction due to unspecified occlusion or stenosis of left middle cerebral artery: Secondary | ICD-10-CM | POA: Diagnosis present

## 2020-12-14 DIAGNOSIS — R269 Unspecified abnormalities of gait and mobility: Secondary | ICD-10-CM | POA: Insufficient documentation

## 2020-12-14 DIAGNOSIS — R4701 Aphasia: Secondary | ICD-10-CM | POA: Diagnosis present

## 2020-12-14 MED ORDER — AMANTADINE HCL 100 MG PO CAPS: 200.0000 mg | ORAL_CAPSULE | Freq: Every day | ORAL | 5 refills | Status: DC

## 2020-12-14 NOTE — Progress Notes (Signed)
Subjective:    Patient ID: Edwin Wall, male    DOB: 15-Nov-1940, 80 y.o.   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   Still on Eliquis for Afib; , Crestor, and Farxiga- to protect kidneys AND to bring BG's down; - (has hx of colon CA)  Here for f/u on stroke.   Started therapy in August- going OK.  Finished end of September-  Not walking with assistive device, but pt doesn't think doing well enough.   R hand - is more red- worried about it.  D/c'd from SLP, OT and PT as well.  Was walking ~ 1 mile at therapy and did machines/Nustep  Has been taking Amantadine 100 mg BID- taking 1x/in Am and in evening-   Sleeps a lot.   Urinating every 2 hours- so frequent and urgent! At night, can go 4-6 hours between   Sometimes has to run to bathroom- sometimes- and has accidents sometimes.   No specific issues.      Pain Inventory Average Pain 0 Pain Right Now 0 My pain is tingling  LOCATION OF PAIN  ankle  BOWEL Number of stools per week: 4-5 Oral laxative use No  Type of laxative na Enema or suppository use No  History of colostomy No  Incontinent No   BLADDER Normal In and out cath, frequency na Able to self cath  na Bladder incontinence Yes  Frequent urination No  Leakage with coughing No  Difficulty starting stream No  Incomplete bladder emptying No    Mobility how many minutes can you walk? 15 ability to climb steps?  yes do you drive?  no  Function Do you have any goals in this area?  no  Neuro/Psych bladder control problems  Prior Studies Any changes since last visit?  no  Physicians involved in your care Any changes since last visit?  no   Family History  Problem Relation Age of Onset   Cancer Mother        LUNG   COPD Father    Social History   Socioeconomic History   Marital status:  Married    Spouse name: Not on file   Number of children: Not on file   Years of education: Not on file   Highest education level: Not on file  Occupational History   Not on file  Tobacco Use   Smoking status: Never   Smokeless tobacco: Never  Vaping Use   Vaping Use: Never used  Substance and Sexual Activity   Alcohol use: No   Drug use: No   Sexual activity: Never  Other Topics Concern   Not on file  Social History Narrative   Not on file   Social Determinants of Health   Financial Resource Strain: Not on file  Food Insecurity: Not on file  Transportation Needs: Not on file  Physical Activity: Not on file  Stress: Not on file  Social Connections: Not on file   Past Surgical History:  Procedure Laterality Date   COLECTOMY  12/17/2016   lap; partial sigmoid colectomy/notes 12/17/2016   COLONOSCOPY W/ BIOPSIES AND POLYPECTOMY  11/11/2016   LAPAROSCOPIC SIGMOID COLECTOMY [name] 12/17/2016   Procedure: LAPAROSCOPIC SIGMOID COLECTOMY;  Surgeon: [name], MD;  Location: [facility];  Service: General;  Laterality: [name];   NASAL SEPTUM  SURGERY     TONSILLECTOMY     Past Medical History:  Diagnosis Date   Anxiety    Arthritis    Cataract    Colon cancer ([place]) dx'd 11/2016   "stage 3"   Gout    Gout    Hemorrhoids    History of kidney stones    "passed it"   Hypertension    Pre-diabetes    BP 124/80   Pulse 84   Temp 97.9 F (36.6 C) (Oral)   Ht 5\' 8"  (1.727 m)   Wt 207 lb 12.8 oz (94.3 kg)   SpO2 98%   BMI 31.60 kg/m   Opioid Risk Score:   Fall Risk Score:  `1  Depression screen PHQ 2/9  Depression screen PHQ 2/9 09/10/2020  Decreased Interest 0  Down, Depressed, Hopeless 0  PHQ - 2 Score 0  Altered sleeping 0  Tired, decreased energy 0  Change in appetite 0  Feeling bad or failure about yourself  1  Trouble concentrating 0  Moving slowly or fidgety/restless 1  Suicidal thoughts 0  PHQ-9 Score 2  Difficult doing work/chores Not difficult at all      Review of Systems  Neurological:        Tingling ankle  All other systems reviewed and are negative.     Objective:   Physical Exam Awake, alert, appropriate, mild aphasia ; word substitutions noted; NAD Caught pen when thrown abruptly So decent reaction time.  MS; 5/5 in UE and LE B/L except 5-/5 in R DF Neuro: decent reaction time and mild aphasia/word finding issues- occ word substitutions.  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   Still on Eliquis for Afib; , Crestor, and Farxiga- to protect kidneys AND to bring BG's down; - (has hx of colon CA) Stroke was 07/15/20  Here for f/u on stroke.    Con't Amantadine, if wanted- needs to be 2 pills in AM.  Can continue the dose, but can also try to wean- to 100 mg daily x 1 week- then stop- but if his speech gets worse; restart it.    2. Will send to Urology- for urgency and frequency- q2 hours-   3. Can still make gains in speech and physical strength for 1 year from day of stroke.    4. Needs to see Neurology- to discuss driving. Can start in parking lot- then SLOWLY progress and see how he does.    5. F/U in 6 months- no spasticity seen on exam   6. See cards at least once for Afib/Eliquis.   I spent a total of 20 minutes on visit- going over driving and Urology requirement and how to wean Amantadine.

## 2020-12-14 NOTE — Patient Instructions (Signed)
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   Still on Eliquis for Afib; , Crestor, and Farxiga- to protect kidneys AND to bring BG's down; - (has hx of colon CA) Stroke was 07/15/20  Here for f/u on stroke.    Con't Amantadine, if wanted- needs to be 2 pills in AM.  Can continue the dose, but can also try to wean- to 100 mg daily x 1 week- then stop- but if his speech gets worse; restart it.    2. Will send to Urology- for urgency and frequency- q2 hours-   3. Can still make gains in speech and physical strength for 1 year from day of stroke.    4. Needs to see Neurology- to discuss driving. Can start in parking lot- then SLOWLY progress and see how he does.    5. F/U in 6 months- no spasticity seen on exam  6> needs cardiology since on Eliquis for A fib- at least once- have PCP refer

## 2020-12-18 ENCOUNTER — Encounter: Payer: Self-pay | Admitting: Cardiology

## 2020-12-18 ENCOUNTER — Ambulatory Visit (INDEPENDENT_AMBULATORY_CARE_PROVIDER_SITE_OTHER): Payer: Medicare Other | Admitting: Cardiology

## 2020-12-18 ENCOUNTER — Other Ambulatory Visit: Payer: Self-pay

## 2020-12-18 VITALS — BP 134/84 | HR 64 | Ht 68.0 in | Wt 207.8 lb

## 2020-12-18 DIAGNOSIS — I4891 Unspecified atrial fibrillation: Secondary | ICD-10-CM | POA: Diagnosis not present

## 2020-12-18 DIAGNOSIS — I63512 Cerebral infarction due to unspecified occlusion or stenosis of left middle cerebral artery: Secondary | ICD-10-CM

## 2020-12-18 DIAGNOSIS — I10 Essential (primary) hypertension: Secondary | ICD-10-CM | POA: Diagnosis not present

## 2020-12-18 DIAGNOSIS — R600 Localized edema: Secondary | ICD-10-CM

## 2020-12-18 NOTE — Patient Instructions (Addendum)
Medication Instructions:  Your physician recommends that you continue on your current medications as directed. Please refer to the Current Medication list given to you today.  *If you need a refill on your cardiac medications before your next appointment, please call your pharmacy*  Lab Work: TODAY: BMET and Mg If you have labs (blood work) drawn today and your tests are completely normal, you will receive your results only by: [facility] (if you have MyChart) OR A paper copy in the mail If you have any lab test that is abnormal or we need to change your treatment, we will call you to review the results.  Follow-Up: At [facility], you and your health needs are our priority.  As part of our continuing mission to provide you with exceptional heart care, we have created designated Provider Care Teams.  These Care Teams include your primary Cardiologist (physician) and Advanced Practice Providers (APPs -  Physician Assistants and Nurse Practitioners) who all work together to provide you with the care you need, when you need it.  Your next appointment:   3 month(s)  The format for your next appointment:   In Person  Provider:   Fransico Him, MD  Other Instructions You have been referred to the atrial fibrillation clinic.

## 2020-12-18 NOTE — Progress Notes (Addendum)
Cardiology CONSULT Note    Date:  12/18/2020   ID:  Edwin Wall, DOB 1940-12-19, MRN [id number]  PCP:  [name], MD  Cardiologist:  Fransico Him, MD   Chief Complaint  Patient presents with   Atrial Fibrillation   Hypertension   History of Present Illness:  Edwin Wall is a 80 y.o. male who is being seen today for the evaluation of new onset atrial fibrillation at the request of Frann Rider, NP.  This is a 80 year old male with a history of stage III colon cancer, gout, hypertension, prediabetes who recently was diagnosed with new onset atrial fibrillation in May 2022. 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  He was started on apixaban 5 mg twice daily for a CHA2DS2-VASc 2 score of now 5. He is now referred for further evaluation.  2D echocardiogram was done 07/16/2020 showing low normal LV function with EF 50 to 55%, mild left atrial enlargement, mild MR. He denies any chest pain or pressure, shortness of breath, PND orthopnea, dizziness, syncope or palpitations.  He has chronic LE edema. He has not had any bleeding issues on Eliquis. He is unaware of his atrial fibrillation.   Past Medical History:  Diagnosis Date   Anxiety    Arthritis    Cataract    Colon cancer ([place]) dx'd 11/2016   "stage 3"   Gout    Gout    Hemorrhoids    History of kidney stones    "passed it"   Hypertension    Pre-diabetes     Past Surgical History:  Procedure Laterality Date   COLECTOMY  12/17/2016   lap; partial sigmoid colectomy/notes 12/17/2016   COLONOSCOPY W/ BIOPSIES AND POLYPECTOMY  11/11/2016   LAPAROSCOPIC SIGMOID COLECTOMY [name] 12/17/2016   Procedure: LAPAROSCOPIC SIGMOID COLECTOMY;  Surgeon: [name], MD;  Location: [facility];  Service: General;  Laterality: [name];   NASAL SEPTUM SURGERY     TONSILLECTOMY      Current Medications: Current Meds  Medication Sig   amantadine  (SYMMETREL) 100 MG capsule Take 2 capsules (200 mg total) by mouth daily. For aphasia   apixaban (ELIQUIS) 5 MG TABS tablet Take 1 tablet (5 mg total) by mouth 2 (two) times daily.   colestipol (COLESTID) 1 g tablet Take 1 tablet (1 g total) by mouth 2 (two) times daily.   dapagliflozin propanediol (FARXIGA) 10 MG TABS tablet Take 1 tablet (10 mg total) by mouth daily.   LORazepam (ATIVAN) 1 MG tablet Take 1 tablet (1 mg total) by mouth daily as needed for anxiety.   rosuvastatin (CRESTOR) 40 MG tablet Take 1 tablet (40 mg total) by mouth daily.   senna-docusate (SENOKOT-S) 8.6-50 MG tablet Take 1 tablet by mouth 2 (two) times daily.    Allergies:   Patient has no known allergies.   Social History   Socioeconomic History   Marital status: Married    Spouse name: Not on file   Number of children: Not on file   Years of education: Not on file   Highest education level: Not on file  Occupational History   Not on file  Tobacco Use   Smoking status: Never   Smokeless tobacco: Never  Vaping Use   Vaping Use: Never used  Substance and Sexual Activity   Alcohol use: No   Drug use: No   Sexual activity: Never  Other Topics Concern  Not on file  Social History Narrative   Not on file   Social Determinants of Health   Financial Resource Strain: Not on file  Food Insecurity: Not on file  Transportation Needs: Not on file  Physical Activity: Not on file  Stress: Not on file  Social Connections: Not on file     Family History:  The patient's family history includes COPD in his father; Cancer in his mother.   ROS:   Please see the history of present illness.    ROS All other systems reviewed and are negative.  No flowsheet data found.     PHYSICAL EXAM:   VS:  BP 134/84   Pulse 64   Ht 5\' 8"  (1.727 m)   Wt 207 lb 12.8 oz (94.3 kg)   SpO2 95%   BMI 31.60 kg/m    GEN: Well nourished, well developed, in no acute distress  HEENT: normal  Neck: no JVD, carotid bruits,  or masses Cardiac: irregularly irregular; no murmurs, rubs, or gallops.  2+ BLE  edema.  Intact distal pulses bilaterally.  Respiratory:  clear to auscultation bilaterally, normal work of breathing GI: soft, nontender, nondistended, + BS MS: no deformity or atrophy  Skin: warm and dry, no rash Neuro:  Alert and Oriented x 3, Strength and sensation are intact Psych: euthymic mood, full affect  Wt Readings from Last 3 Encounters:  12/18/20 207 lb 12.8 oz (94.3 kg)  12/14/20 207 lb 12.8 oz (94.3 kg)  11/24/20 214 lb (97.1 kg)      Studies/Labs Reviewed:   EKG:  EKG is not ordered today.  Recent Labs: 07/16/2020: TSH 0.966 07/18/2020: Magnesium 1.9 11/24/2020: ALT 15; BUN 11; Creatinine, Ser 1.56; Hemoglobin 13.1; Platelets 181; Potassium 2.9; Sodium 140   Lipid Panel    Component Value Date/Time   CHOL 179 07/16/2020 0337   TRIG 92 07/16/2020 0337   HDL 34 (L) 07/16/2020 0337   CHOLHDL 5.3 07/16/2020 0337   VLDL 18 07/16/2020 0337   LDLCALC 127 (H) 07/16/2020 0337     Additional studies/ records that were reviewed today include:  OV notes from PCP    ASSESSMENT:    1. New onset atrial fibrillation ([place])   2. Primary hypertension   3. Left middle cerebral artery stroke ([place])   4. Leg edema      PLAN:  In order of problems listed above:  Persistent atrial fibrillation -He remains in atrial fibrillation on exam today.  Heart rate is well controlled. -He denies any bleeding problems on DOAC -He will continue on Eliquis 5 mg twice daily for CHA2DS2-VASc score of 5 -2D echo showed low normal LV function with mild left atrial enlargement -TSH was normal in May 2022 -check BMET today as K+ was 2.9 at ER -He has not missed any doses of Eliquis since  -will refer to Afib clinic >> should be able to have DCCV in the near future  2  Hypertension -BP is well controlled on exam today. -Blood pressure is diet controlled  3.  Acute L MCA -occurred in setting of new  onset atrial fibrillation -now on Eliquis -per Neuro  4.  LE edema -likely exacerbated by atrial fibrillation -he has been having problems with urinary incontinence so will hold on diuretics for now -I have given him a Rx for compression>if this does not help then consider diuretic -he is supposed to see Urologist  Followup with me in 3 months  Time Spent: 25 minutes total time  of encounter, including 15 minutes spent in face-to-face patient care on the date of this encounter. This time includes coordination of care and counseling regarding above mentioned problem list. Remainder of non-face-to-face time involved reviewing chart documents/testing relevant to the patient encounter and documentation in the medical record. I have independently reviewed documentation from referring provider  Medication Adjustments/Labs and Tests Ordered: Current medicines are reviewed at length with the patient today.  Concerns regarding medicines are outlined above.  Medication changes, Labs and Tests ordered today are listed in the Patient Instructions below.  There are no Patient Instructions on file for this visit.   Signed, Fransico Him, MD  12/18/2020 1:37 PM    [facility] [address], [place], [place]  [address] Phone: [phone]; Fax: [phone]

## 2020-12-18 NOTE — Addendum Note (Signed)
Addended by: Antonieta Iba on: 12/18/2020 01:52 PM   Modules accepted: Orders

## 2020-12-19 LAB — BASIC METABOLIC PANEL
BUN/Creatinine Ratio: 9 — ABNORMAL LOW (ref 10–24)
BUN: 13 mg/dL (ref 8–27)
CO2: 25 mmol/L (ref 20–29)
Calcium: 9.6 mg/dL (ref 8.6–10.2)
Chloride: 105 mmol/L (ref 96–106)
Creatinine, Ser: 1.52 mg/dL — ABNORMAL HIGH (ref 0.76–1.27)
Glucose: 162 mg/dL — ABNORMAL HIGH (ref 70–99)
Potassium: 3.7 mmol/L (ref 3.5–5.2)
Sodium: 147 mmol/L — ABNORMAL HIGH (ref 134–144)
eGFR: 46 mL/min/{1.73_m2} — ABNORMAL LOW (ref >59)

## 2020-12-19 LAB — MAGNESIUM: Magnesium: 2.1 mg/dL (ref 1.6–2.3)

## 2020-12-20 ENCOUNTER — Telehealth: Payer: Self-pay

## 2020-12-20 MED ORDER — APIXABAN 2.5 MG PO TABS
2.5000 mg | ORAL_TABLET | Freq: Two times a day (BID) | ORAL | 3 refills | Status: DC
Start: 2020-12-20 — End: 2023-07-20

## 2020-12-20 NOTE — Telephone Encounter (Signed)
The patient has been notified of the result and verbalized understanding.  All questions (if any) were answered. Antonieta Iba, RN 12/20/2020 12:11 PM

## 2020-12-20 NOTE — Telephone Encounter (Signed)
-----   Message from Sueanne Margarita, MD sent at 12/19/2020 10:38 AM EDT ----- Stable labs but SCr has been consistently > 1.5.  He will turn 80 this week so please decrease Apixaban to 2.5mg  BID - continue current meds and forward to PCP

## 2020-12-23 ENCOUNTER — Emergency Department (HOSPITAL_COMMUNITY): Payer: Medicare Other

## 2020-12-23 ENCOUNTER — Other Ambulatory Visit: Payer: Self-pay

## 2020-12-23 ENCOUNTER — Observation Stay (HOSPITAL_COMMUNITY)
Admission: EM | Admit: 2020-12-23 | Discharge: 2020-12-24 | Disposition: A | Discharge status: Home / Self Care | Payer: Medicare Other | Attending: Internal Medicine | Admitting: Internal Medicine

## 2020-12-23 ENCOUNTER — Encounter (HOSPITAL_COMMUNITY): Payer: Self-pay

## 2020-12-23 DIAGNOSIS — Y9301 Activity, walking, marching and hiking: Secondary | ICD-10-CM | POA: Insufficient documentation

## 2020-12-23 DIAGNOSIS — Z8673 Personal history of transient ischemic attack (TIA), and cerebral infarction without residual deficits: Secondary | ICD-10-CM | POA: Insufficient documentation

## 2020-12-23 DIAGNOSIS — I130 Hypertensive heart and chronic kidney disease with heart failure and stage 1 through stage 4 chronic kidney disease, or unspecified chronic kidney disease: Secondary | ICD-10-CM | POA: Insufficient documentation

## 2020-12-23 DIAGNOSIS — N1832 Chronic kidney disease, stage 3b: Secondary | ICD-10-CM | POA: Diagnosis not present

## 2020-12-23 DIAGNOSIS — Z79899 Other long term (current) drug therapy: Secondary | ICD-10-CM | POA: Diagnosis not present

## 2020-12-23 DIAGNOSIS — Z7901 Long term (current) use of anticoagulants: Secondary | ICD-10-CM | POA: Diagnosis not present

## 2020-12-23 DIAGNOSIS — E1122 Type 2 diabetes mellitus with diabetic chronic kidney disease: Secondary | ICD-10-CM | POA: Diagnosis not present

## 2020-12-23 DIAGNOSIS — Z20822 Contact with and (suspected) exposure to covid-19: Secondary | ICD-10-CM | POA: Diagnosis not present

## 2020-12-23 DIAGNOSIS — W01198A Fall on same level from slipping, tripping and stumbling with subsequent striking against other object, initial encounter: Secondary | ICD-10-CM | POA: Diagnosis not present

## 2020-12-23 DIAGNOSIS — R55 Syncope and collapse: Secondary | ICD-10-CM | POA: Insufficient documentation

## 2020-12-23 DIAGNOSIS — R2681 Unsteadiness on feet: Secondary | ICD-10-CM | POA: Diagnosis not present

## 2020-12-23 DIAGNOSIS — I5030 Unspecified diastolic (congestive) heart failure: Secondary | ICD-10-CM | POA: Insufficient documentation

## 2020-12-23 DIAGNOSIS — E876 Hypokalemia: Secondary | ICD-10-CM

## 2020-12-23 DIAGNOSIS — S0990XA Unspecified injury of head, initial encounter: Secondary | ICD-10-CM | POA: Diagnosis not present

## 2020-12-23 DIAGNOSIS — Z85038 Personal history of other malignant neoplasm of large intestine: Secondary | ICD-10-CM | POA: Diagnosis not present

## 2020-12-23 DIAGNOSIS — R4020 Unspecified coma: Secondary | ICD-10-CM | POA: Diagnosis present

## 2020-12-23 LAB — RESP PANEL BY RT-PCR (FLU A&B, COVID) ARPGX2
Influenza A by PCR: NEGATIVE
Influenza B by PCR: NEGATIVE
SARS Coronavirus 2 by RT PCR: NEGATIVE

## 2020-12-23 LAB — CBC
HCT: 46 % (ref 39.0–52.0)
Hemoglobin: 14.7 g/dL (ref 13.0–17.0)
MCH: 30.9 pg (ref 26.0–34.0)
MCHC: 32 g/dL (ref 30.0–36.0)
MCV: 96.6 fL (ref 80.0–100.0)
Platelets: 193 10*3/uL (ref 150–400)
RBC: 4.76 MIL/uL (ref 4.22–5.81)
RDW: 13.6 % (ref 11.5–15.5)
WBC: 6.9 10*3/uL (ref 4.0–10.5)
nRBC: 0 % (ref 0.0–0.2)

## 2020-12-23 LAB — BASIC METABOLIC PANEL
Anion gap: 10 (ref 5–15)
BUN: 12 mg/dL (ref 8–23)
CO2: 26 mmol/L (ref 22–32)
Calcium: 9.2 mg/dL (ref 8.9–10.3)
Chloride: 107 mmol/L (ref 98–111)
Creatinine, Ser: 1.63 mg/dL — ABNORMAL HIGH (ref 0.61–1.24)
GFR, Estimated: 42 mL/min — ABNORMAL LOW (ref >60)
Glucose, Bld: 127 mg/dL — ABNORMAL HIGH (ref 70–99)
Potassium: 3.1 mmol/L — ABNORMAL LOW (ref 3.5–5.1)
Sodium: 143 mmol/L (ref 135–145)

## 2020-12-23 LAB — TROPONIN I (HIGH SENSITIVITY)
Troponin I (High Sensitivity): 24 ng/L — ABNORMAL HIGH (ref <18)
Troponin I (High Sensitivity): 27 ng/L — ABNORMAL HIGH (ref <18)

## 2020-12-23 LAB — PROTIME-INR
INR: 1.4 — ABNORMAL HIGH (ref 0.8–1.2)
Prothrombin Time: 17.5 seconds — ABNORMAL HIGH (ref 11.4–15.2)

## 2020-12-23 LAB — URINALYSIS, ROUTINE W REFLEX MICROSCOPIC
Bacteria, UA: NONE SEEN
Bilirubin Urine: NEGATIVE
Glucose, UA: >=500 mg/dL — AB
Ketones, ur: 5 mg/dL — AB
Nitrite: NEGATIVE
Protein, ur: NEGATIVE mg/dL
Specific Gravity, Urine: 1.018 (ref 1.005–1.030)
pH: 5 (ref 5.0–8.0)

## 2020-12-23 LAB — MAGNESIUM: Magnesium: 1.9 mg/dL (ref 1.7–2.4)

## 2020-12-23 LAB — HEMOGLOBIN A1C
Hgb A1c MFr Bld: 6.2 % — ABNORMAL HIGH (ref 4.8–5.6)
Mean Plasma Glucose: 131.24 mg/dL

## 2020-12-23 LAB — CBG MONITORING, ED: Glucose-Capillary: 102 mg/dL — ABNORMAL HIGH (ref 70–99)

## 2020-12-23 IMAGING — CT CT CERVICAL SPINE W/O CM
4 of 5 series · 15 of 33 positions shown, 17 images · non-contrast
Comparison: [DATE]

CLINICAL DATA: Neck trauma (Age >= 65y); Head trauma, minor (Age >=
65y)

EXAM:
CT HEAD WITHOUT CONTRAST
CT CERVICAL SPINE WITHOUT CONTRAST
TECHNIQUE: Multidetector CT imaging of the head and cervical spine was
performed following the standard protocol without intravenous
contrast. Multiplanar CT image reconstructions of the cervical spine
were also generated.

[Series 5: c spine soft · axial · 0.42mm/px · z∈[-229,-161]mm · 3 of 86 slices shown]
[im 18/86  soft-tissue]
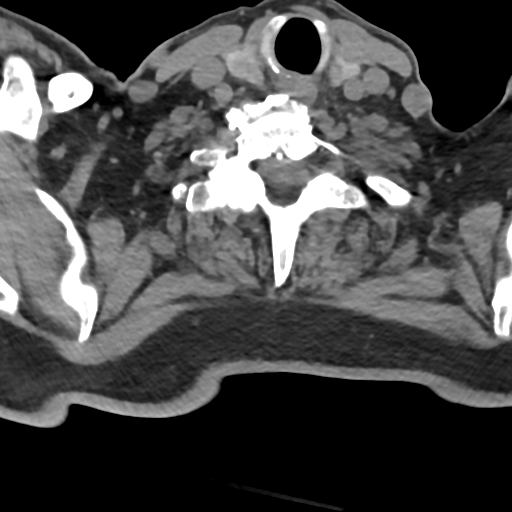
[im 35/86  soft-tissue]
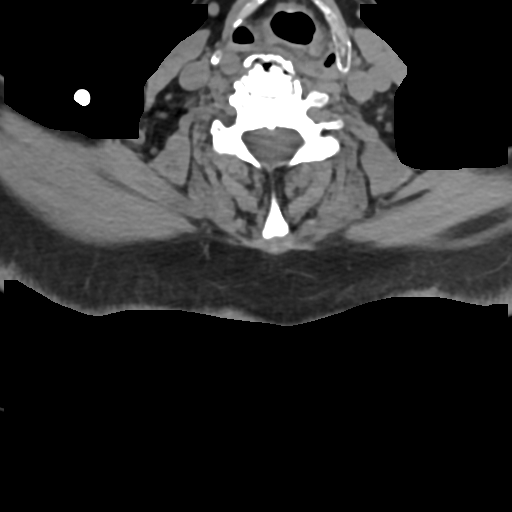
[im 52/86  soft-tissue]
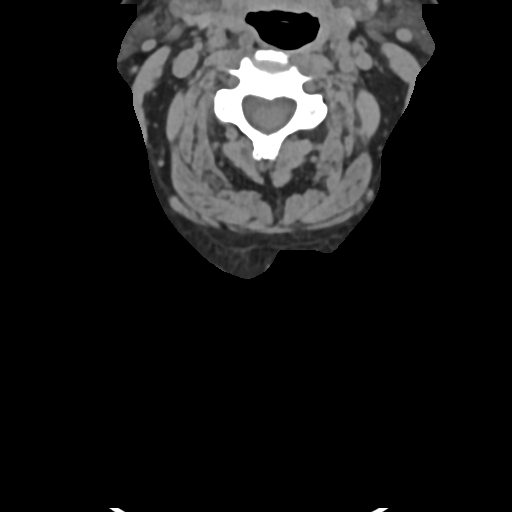

[Series 8: sag bone · sagittal · 0.29mm/px · 5 of 77 slices shown, 6 images]
[im 26/77  bone]
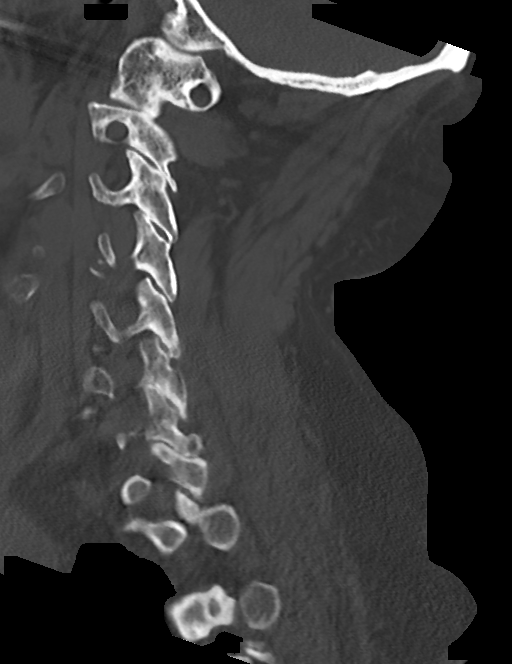
[im 32/77  bone]
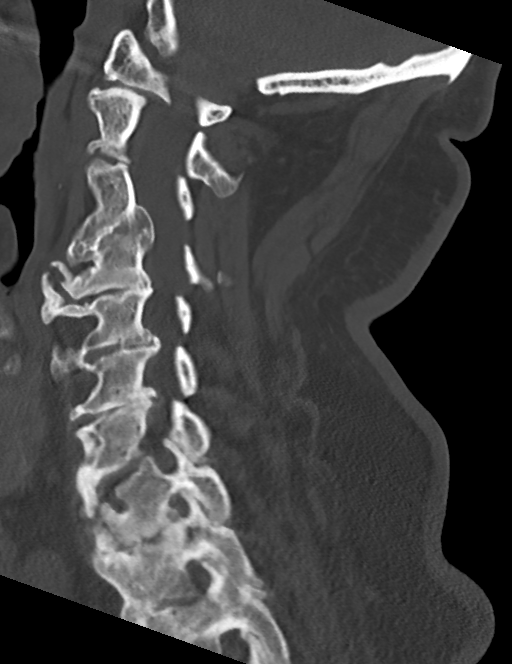
[im 39/77  soft-tissue]
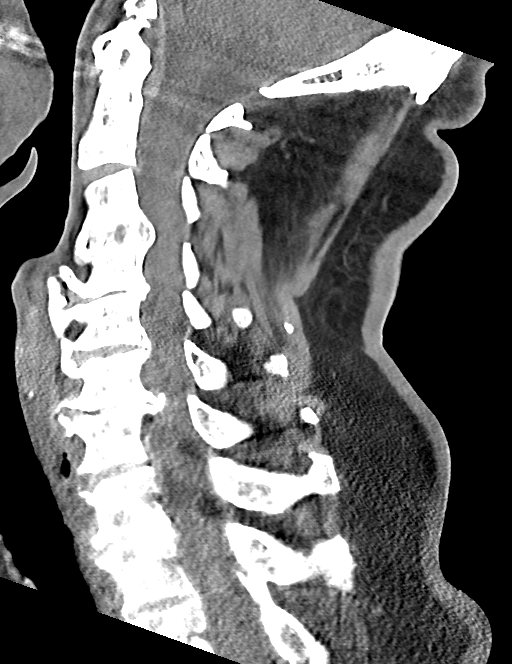
[im 39/77  bone]
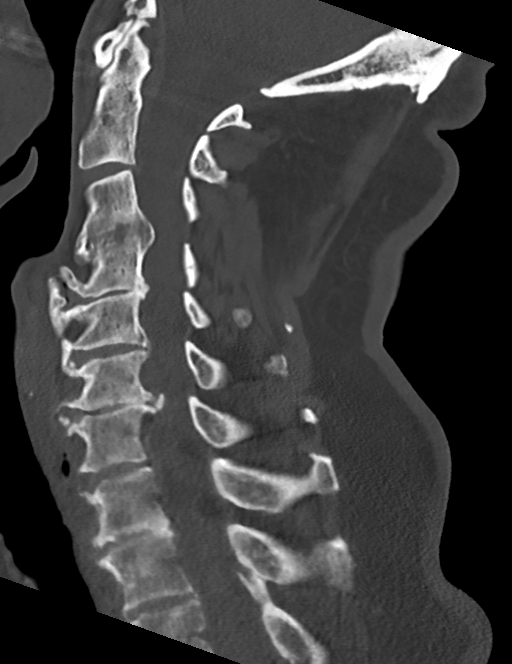
[im 45/77  bone]
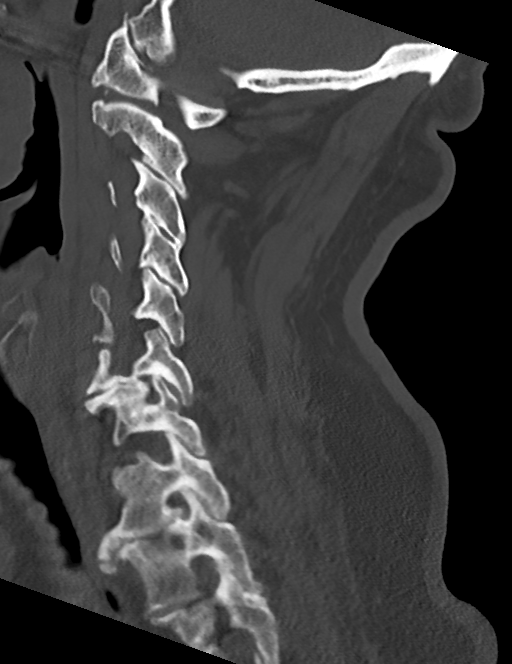
[im 51/77  bone]
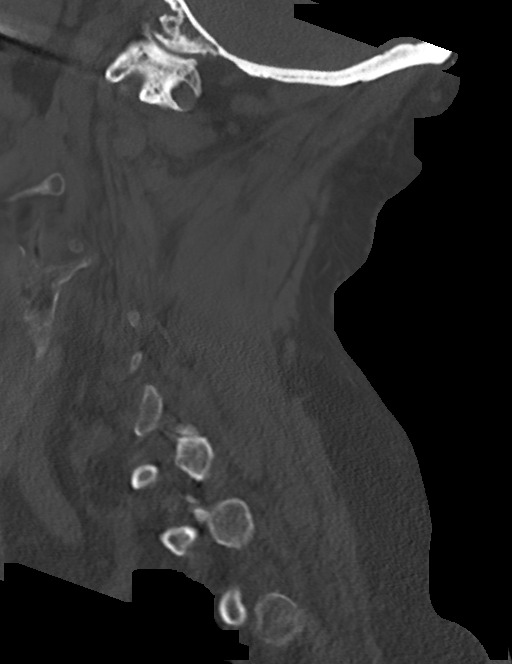

[Series 9: cor bone · coronal · 0.32mm/px · 3 of 69 slices shown]
[im 14/69  bone]
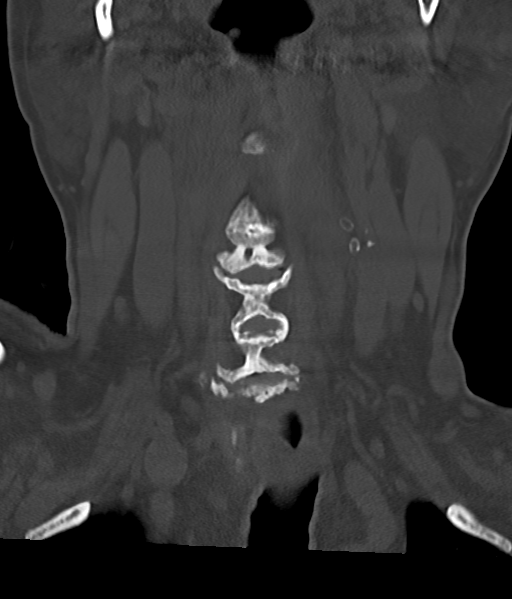
[im 28/69  bone]
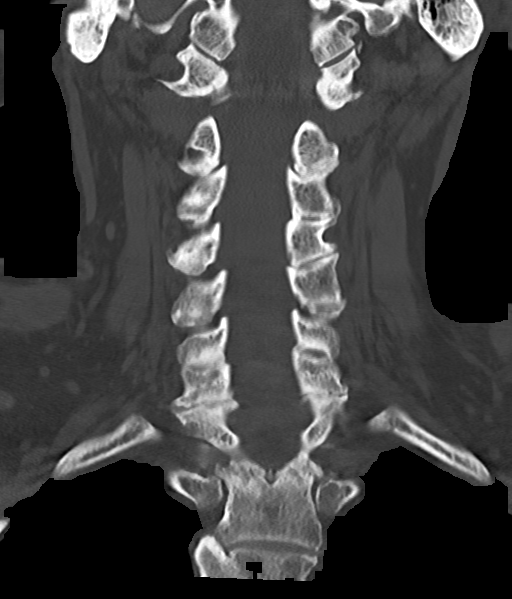
[im 41/69  bone]
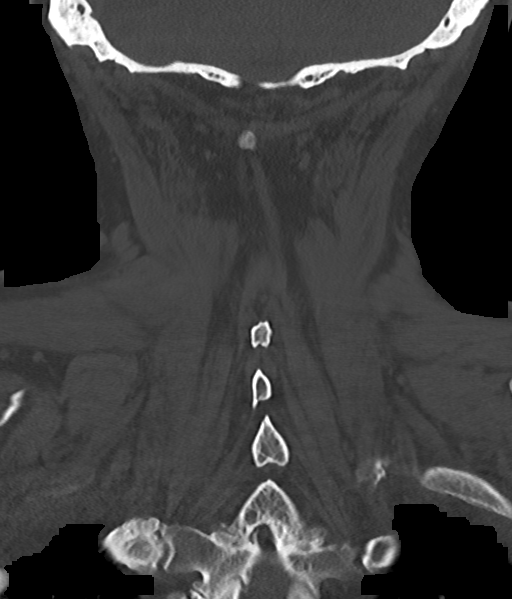

[Series 11: orthogonal axials · axial · 0.21mm/px · z∈[-268,-157]mm · 4 of 98 slices shown, 5 images]
[im 20/98  soft-tissue]
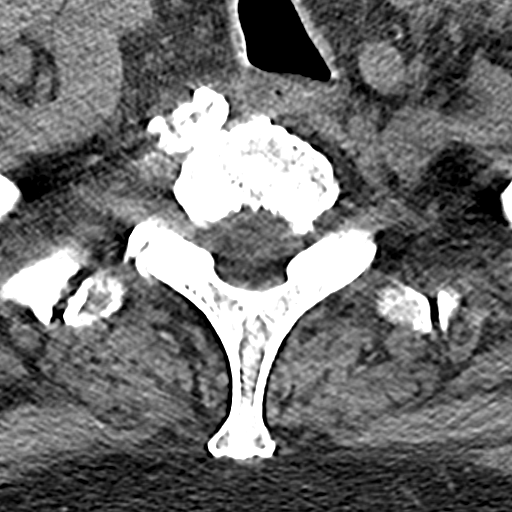
[im 20/98  bone]
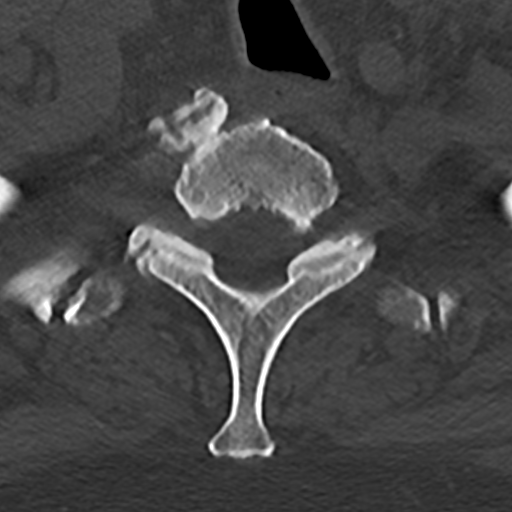
[im 39/98  bone]
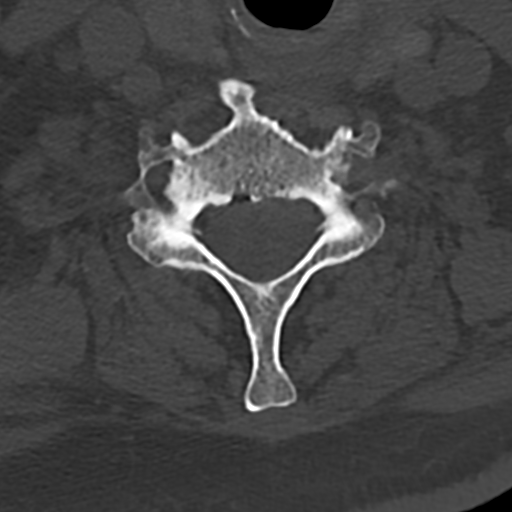
[im 59/98  bone]
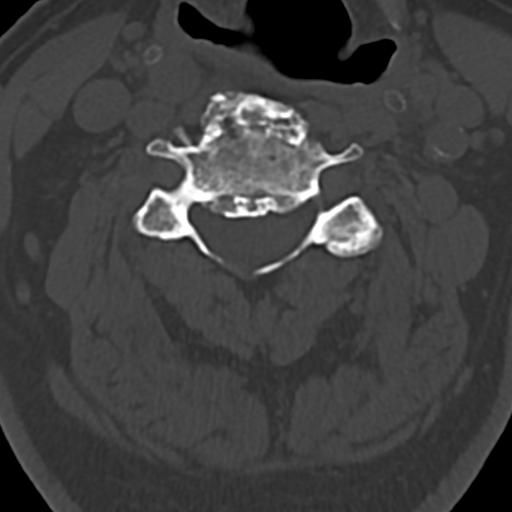
[im 78/98  bone]
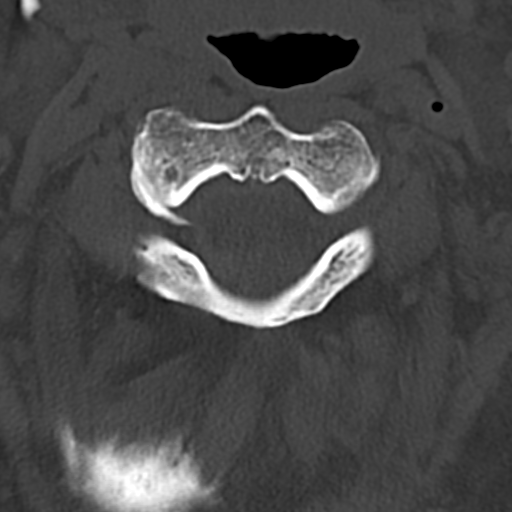

[15 of 33 positions shown; findings below may reference images not displayed]

FINDINGS: CT HEAD FINDINGS

Brain: No evidence of acute infarction, hemorrhage, hydrocephalus,
extra-axial collection or mass lesion/mass effect. Revisualization
of sequela of prior LEFT MCA and LEFT basal ganglia infarction.
Global parenchymal volume loss.

Vascular: Vascular calcifications.

Skull: No acute fracture.

Sinuses/Orbits: Chronic appearing opacification with corresponding
osseous changes of the sphenoid sinuses most consistent with chronic
sinusitis. Trace bilateral mastoid effusions.

Other: Bilateral ear cerumen.

CT CERVICAL SPINE FINDINGS

Alignment: No spondylolisthesis. Unchanged rating the cervical
lordosis.

Skull base and vertebrae: Unchanged lucency of C2 likely reflecting
degenerative changes. No acute fracture.

Soft tissues and spinal canal: No prevertebral fluid or swelling. No
visible canal hematoma.

Disc levels: Posterior disc osteophyte complex at C6-7 results in
moderate to severe canal narrowing at this level. Posterior disc
osteophyte complex at C3-4 results in moderate narrowing. Near
complete osseous ankylosis of C3-4. Multilevel intervertebral disc
space height loss with uncovertebral hypertrophy and facet
arthropathy. Multilevel osseous neuroforaminal narrowing most
pronounced at C3-4 and C5-6 bilaterally.

Upper chest: Negative.

Other: None
IMPRESSION: 1.  No acute intracranial abnormality.
2.  No acute fracture or static subluxation of the cervical spine.

## 2020-12-23 IMAGING — DX DG CHEST 1V PORT
1 series · 1 of 1 positions shown · non-contrast
Comparison: [DATE]

CLINICAL DATA: Fall

EXAM:
PORTABLE CHEST 1 VIEW

[chest ap]
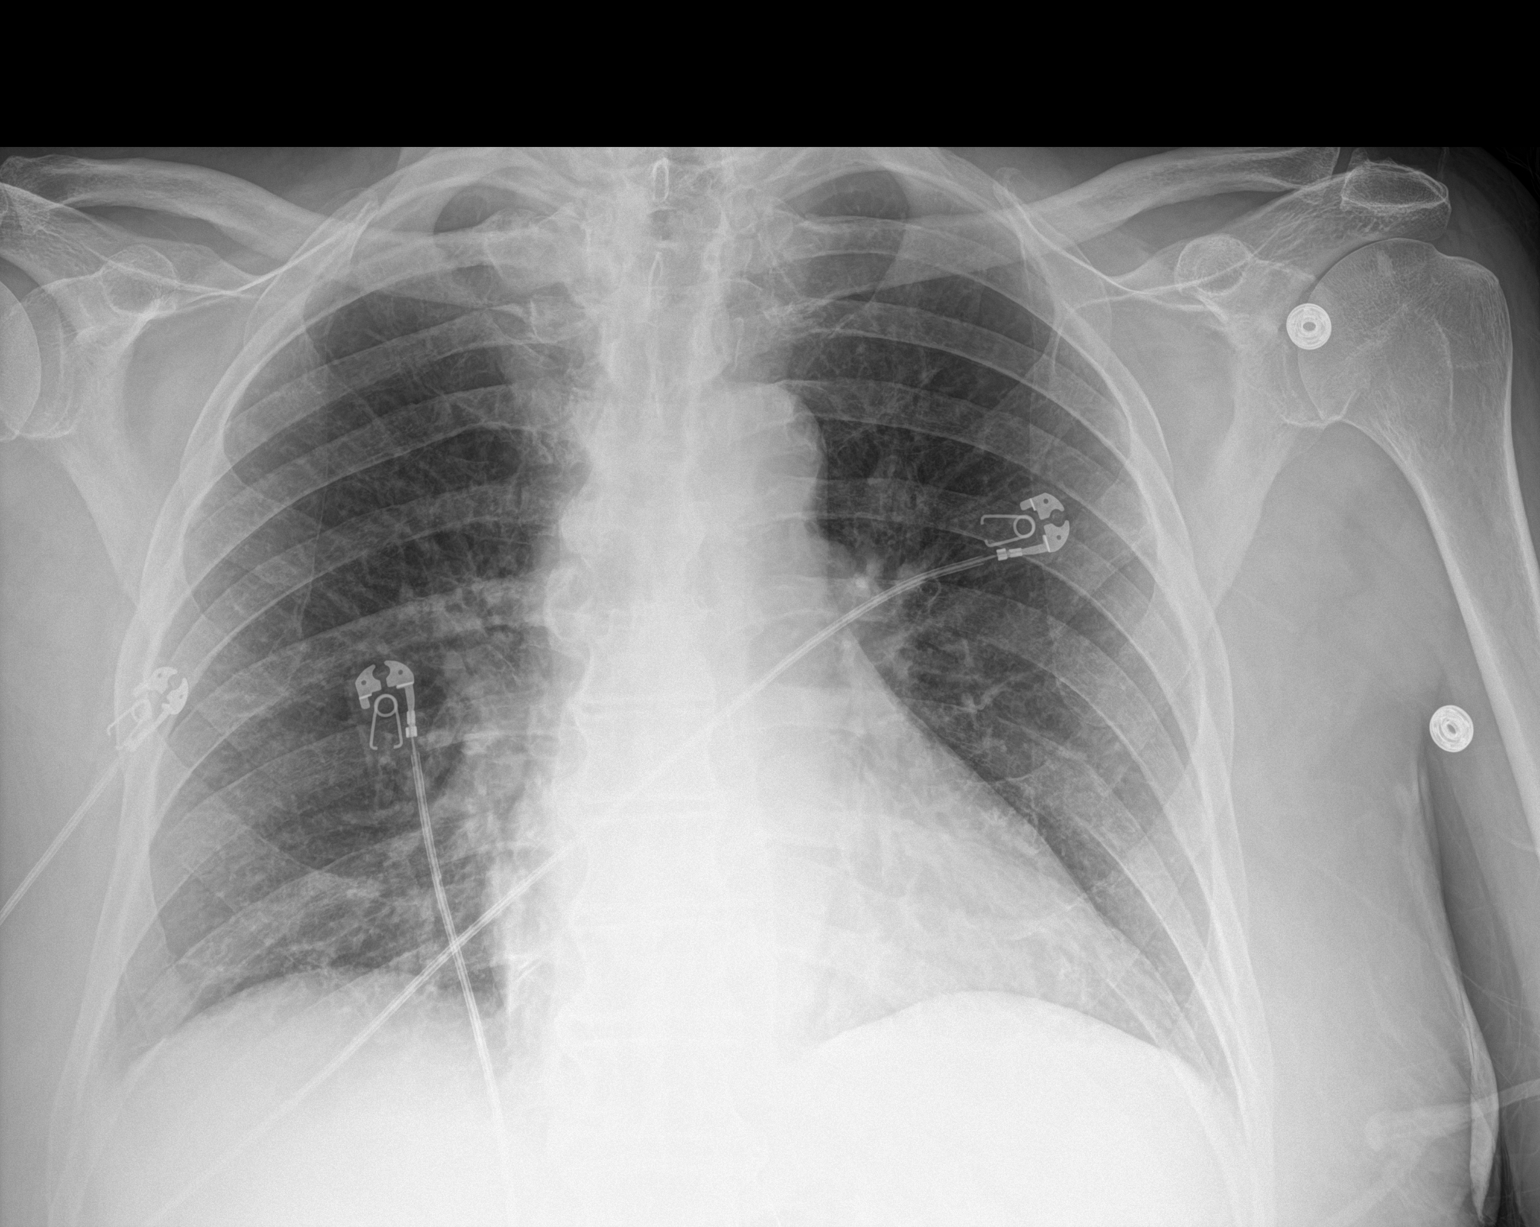

[1 of 1 positions shown; findings below may reference images not displayed]

FINDINGS: The heart size and mediastinal contours are within normal limits.
Low lung volumes. No focal pulmonary abnormality. No pleural
effusion the visualized skeletal structures are unremarkable.
IMPRESSION: No active disease.

## 2020-12-23 IMAGING — CT CT HEAD W/O CM
3 series · 14 of 47 positions shown, 16 images · non-contrast
Comparison: [DATE]

CLINICAL DATA: Neck trauma (Age >= 65y); Head trauma, minor (Age >=
65y)

EXAM:
CT HEAD WITHOUT CONTRAST
CT CERVICAL SPINE WITHOUT CONTRAST
TECHNIQUE: Multidetector CT imaging of the head and cervical spine was
performed following the standard protocol without intravenous
contrast. Multiplanar CT image reconstructions of the cervical spine
were also generated.

[Series 1: head bone · axial · 0.44mm/px · z∈[-122,+22]mm · 8 of 84 slices shown, 10 images]
[im 6/84  brain]
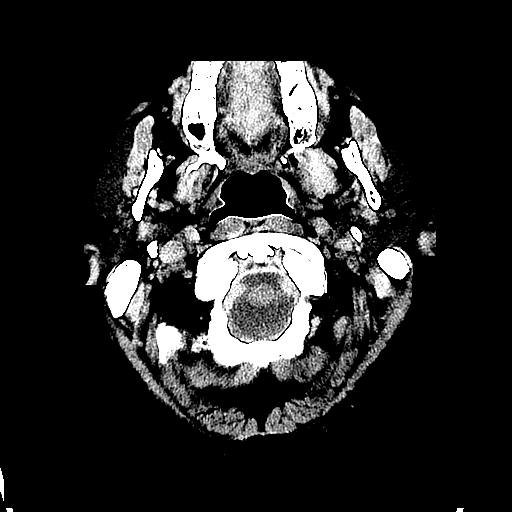
[im 6/84  bone]
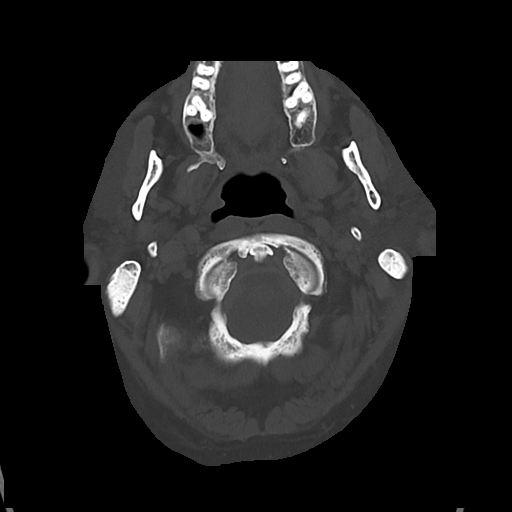
[im 18/84  brain]
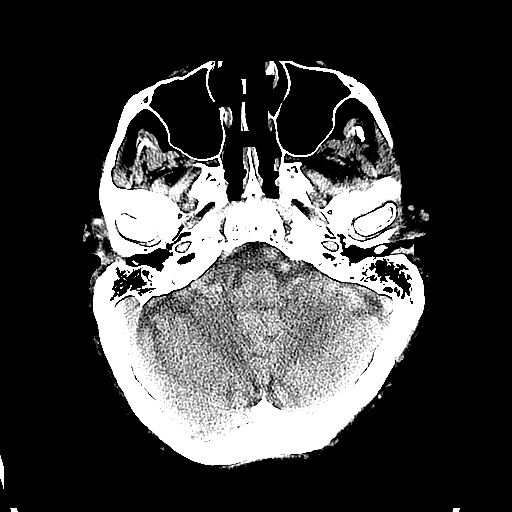
[im 26/84  brain]
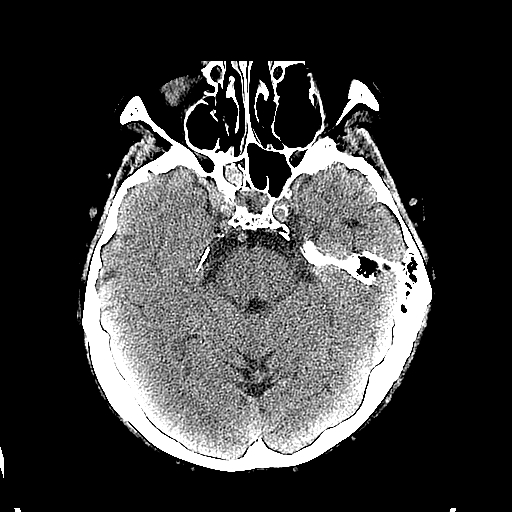
[im 38/84  brain]
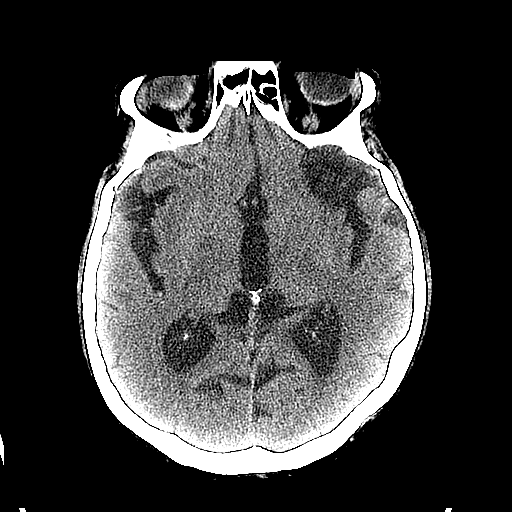
[im 46/84  brain]
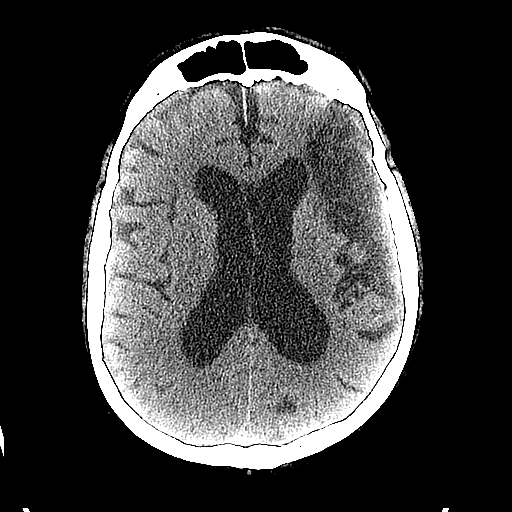
[im 46/84  bone]
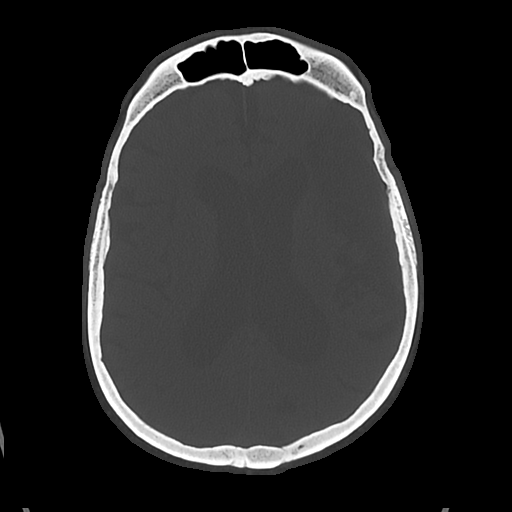
[im 58/84  brain]
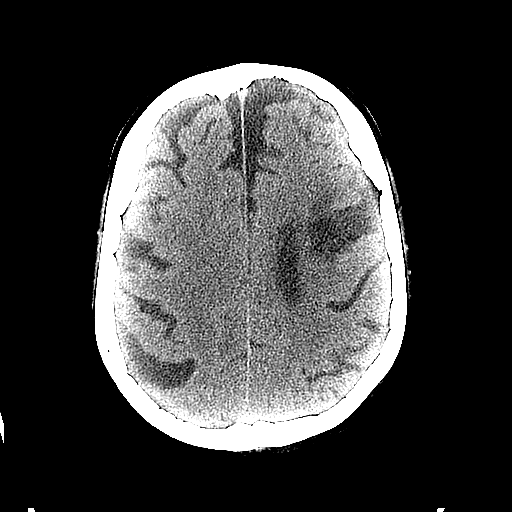
[im 66/84  brain]
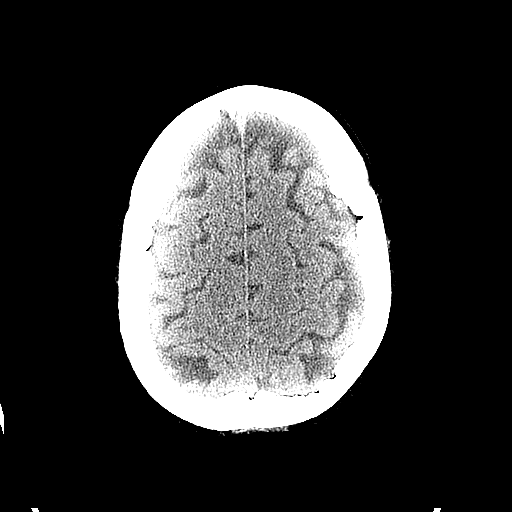
[im 78/84  brain]
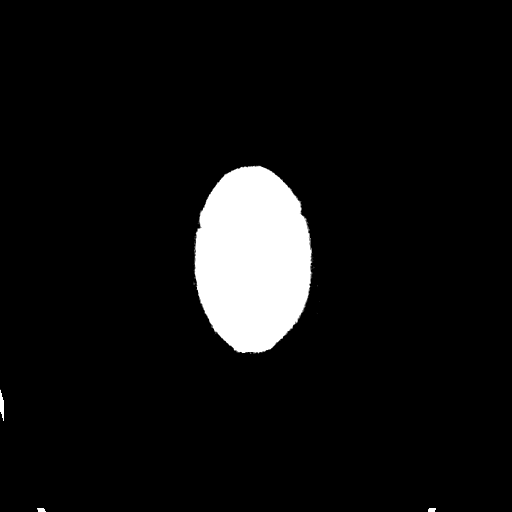

[Series 4: cor soft · coronal · 0.28mm/px · 3 of 78 slices shown]
[im 27/78  brain]
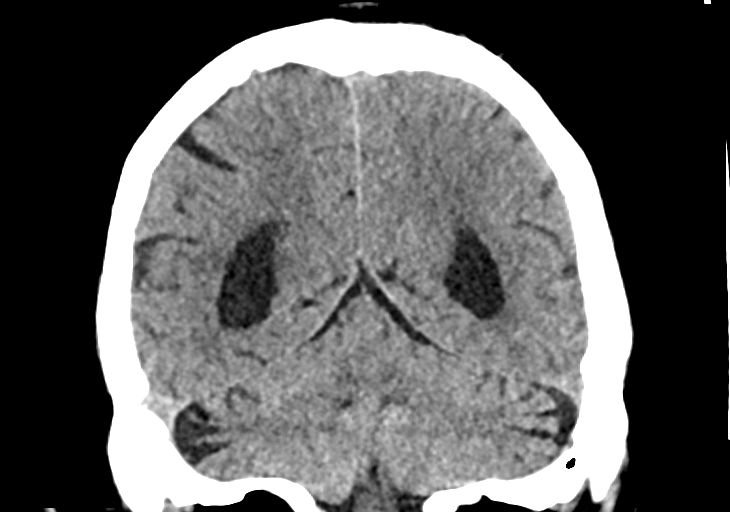
[im 35/78  brain]
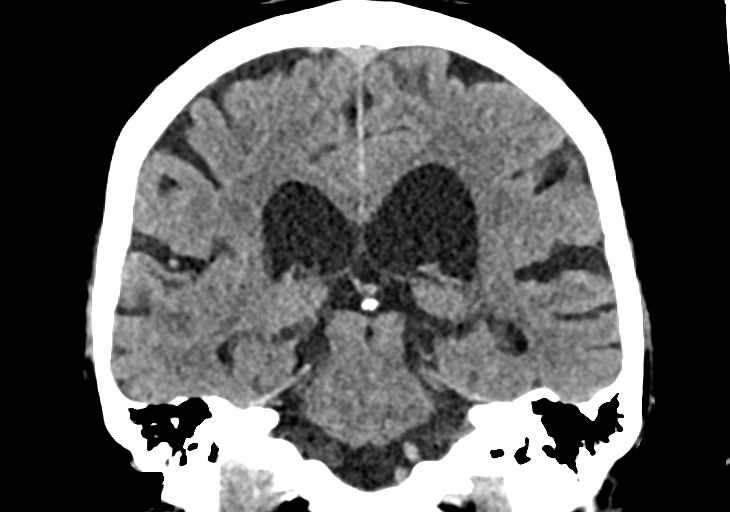
[im 43/78  brain]
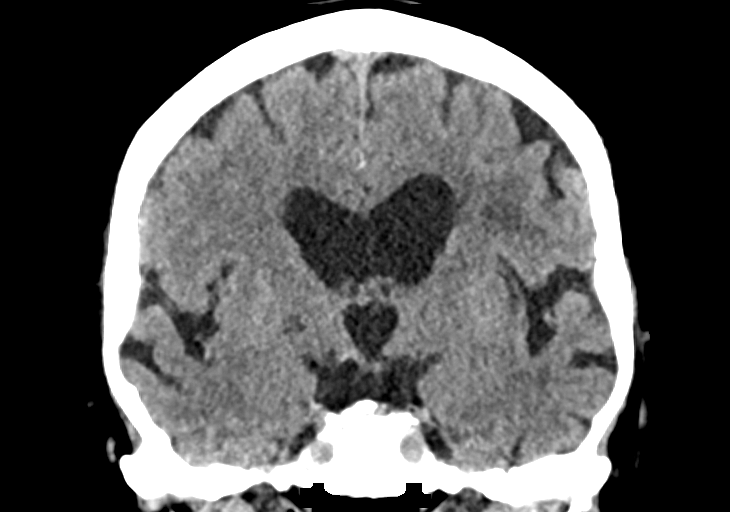

[Series 5: sag soft · sagittal · 0.31mm/px · 3 of 67 slices shown]
[im 23/67  brain]
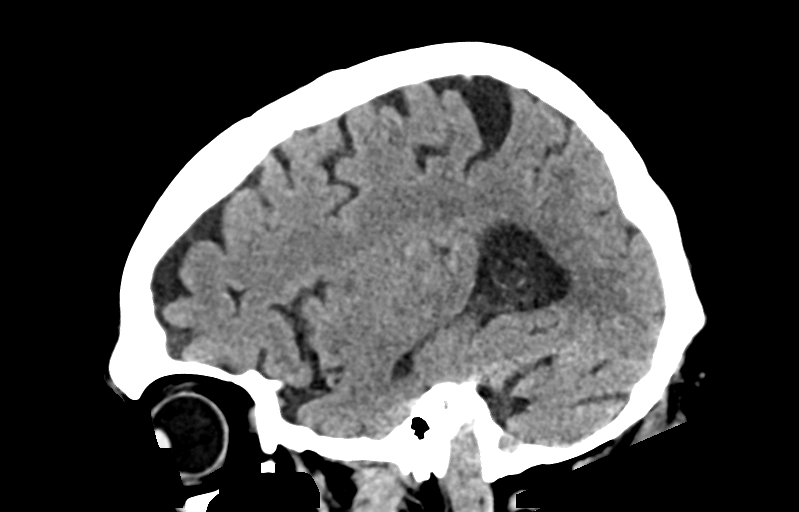
[im 34/67  brain]
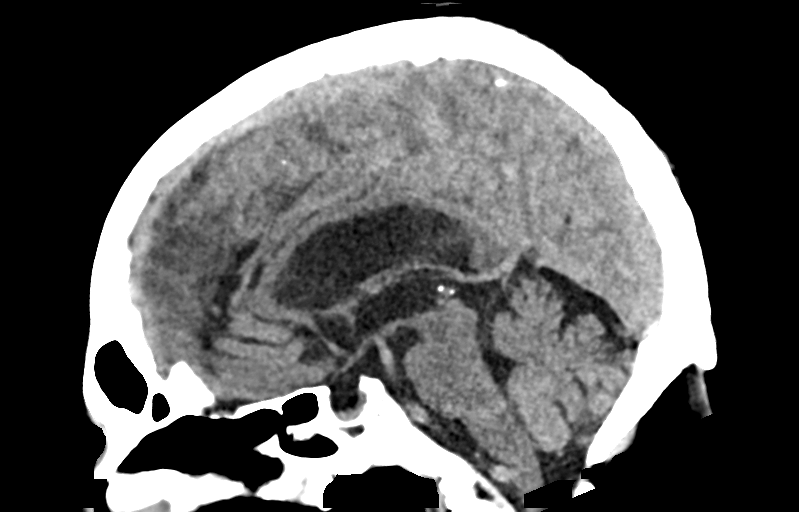
[im 45/67  brain]
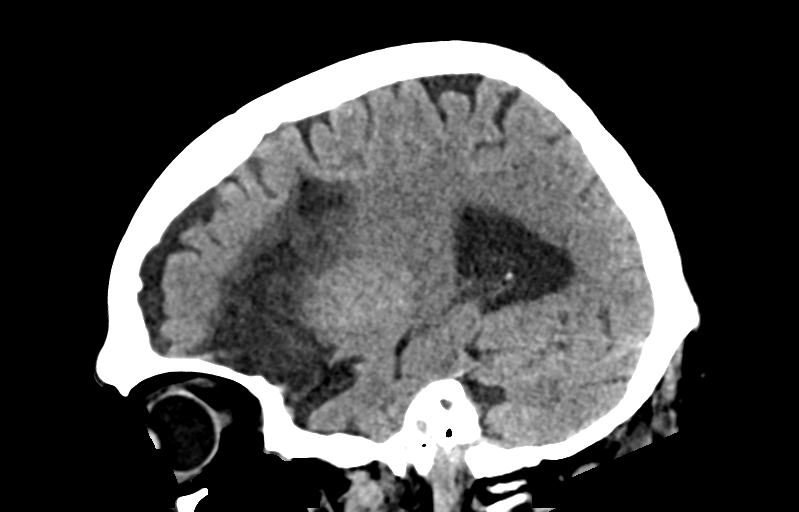

[14 of 47 positions shown; findings below may reference images not displayed]

FINDINGS: CT HEAD FINDINGS

Brain: No evidence of acute infarction, hemorrhage, hydrocephalus,
extra-axial collection or mass lesion/mass effect. Revisualization
of sequela of prior LEFT MCA and LEFT basal ganglia infarction.
Global parenchymal volume loss.

Vascular: Vascular calcifications.

Skull: No acute fracture.

Sinuses/Orbits: Chronic appearing opacification with corresponding
osseous changes of the sphenoid sinuses most consistent with chronic
sinusitis. Trace bilateral mastoid effusions.

Other: Bilateral ear cerumen.

CT CERVICAL SPINE FINDINGS

Alignment: No spondylolisthesis. Unchanged rating the cervical
lordosis.

Skull base and vertebrae: Unchanged lucency of C2 likely reflecting
degenerative changes. No acute fracture.

Soft tissues and spinal canal: No prevertebral fluid or swelling. No
visible canal hematoma.

Disc levels: Posterior disc osteophyte complex at C6-7 results in
moderate to severe canal narrowing at this level. Posterior disc
osteophyte complex at C3-4 results in moderate narrowing. Near
complete osseous ankylosis of C3-4. Multilevel intervertebral disc
space height loss with uncovertebral hypertrophy and facet
arthropathy. Multilevel osseous neuroforaminal narrowing most
pronounced at C3-4 and C5-6 bilaterally.

Upper chest: Negative.

Other: None
IMPRESSION: 1.  No acute intracranial abnormality.
2.  No acute fracture or static subluxation of the cervical spine.

## 2020-12-23 MED ORDER — SODIUM CHLORIDE 0.9 % IV SOLN: INTRAVENOUS | Status: DC

## 2020-12-23 MED ORDER — APIXABAN 2.5 MG PO TABS
2.5000 mg | ORAL_TABLET | Freq: Two times a day (BID) | ORAL | Status: DC
  Administered 2020-12-23: 2.5 mg via ORAL
  Filled 2020-12-23: qty 1

## 2020-12-23 MED ORDER — POTASSIUM CHLORIDE CRYS ER 20 MEQ PO TBCR
40.0000 meq | EXTENDED_RELEASE_TABLET | Freq: Once | ORAL | Status: AC
  Administered 2020-12-23: 40 meq via ORAL
  Filled 2020-12-23: qty 2

## 2020-12-23 MED ORDER — SODIUM CHLORIDE 0.9 % IV BOLUS
1000.0000 mL | Freq: Once | INTRAVENOUS | Status: AC
  Administered 2020-12-23: 1000 mL via INTRAVENOUS

## 2020-12-23 MED ORDER — ROSUVASTATIN CALCIUM 20 MG PO TABS
40.0000 mg | ORAL_TABLET | Freq: Every day | ORAL | Status: DC
  Administered 2020-12-23 – 2020-12-24 (×2): 40 mg via ORAL
  Filled 2020-12-23 (×2): qty 2

## 2020-12-23 NOTE — ED Triage Notes (Signed)
Patient arrived by [facility] following reported syncopal event earlier today at 11am. Patient was found outside by family. Patient alert and appropriate at baseline. On arrival denies pain, doesn't remember feeling bad or any syncopal event. NAD

## 2020-12-23 NOTE — ED Notes (Signed)
Trauma Response Nurse Note-  Reason for Call / Reason for Trauma activation:   - L2 fall on eliquis - initially no head trauma reported, but wife now reports patient did hit his head, no obvious head trauma present  Initial Focused Assessment (If applicable, or please see trauma documentation):  - VSS, A&Ox4, follows commands, no obvious external trauma -Per wife patient was walking down front stairs carrying something "heavy", she does not know exactly what happened but saw him falling. He fell into the grass hitting his right shoulder and back of his head. He did not lose consciousness and was able to get up after the fall with help from his son. They helped him back inside to his chair where he rested. Wife came back later and found him slightly slumped and hard to arouse. She reported the recliner feet was down but his feet were straight out in front of him. Reported to MD. History of Afib with prior stroke, cardioversion scheduled for Tuesday. No other major medical history per wife.  Interventions:  - CT head and cspine - IVF, labwork  Plan of Care as of this note:  - Imaging and labs pending at this time

## 2020-12-23 NOTE — H&P (Addendum)
Date: 12/23/2020               Patient Name:  Edwin Wall MRN: [id number]  DOB: 1940/11/11 Age / Sex: 80 y.o., male   PCP: [name], MD         Medical Service: Internal Medicine Teaching Service         Attending Physician: Dr. Dareen Piano     First Contact: Lajean Manes, MD Pager: [phone]  Second Contact: Jeanie Cooks, MD Pager: Governor Rooks [phone]       After Hours (After 5p/  First Contact Pager: [phone]  weekends / holidays): Second Contact Pager: [phone]    Chief Complaint: dizziness/lightheadedness resulting in a fall   History of Present Illness:  Edwin Wall is a 80 y.o. 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   Son and wife saw him fall into the grass hitting his right shoulder and back of his head as he was walking down back porch this morning. No prolonged sun exposure (outside for minutes), no decrease in hydration status, and no recent new stressor. He did not lose consciousness and was able to get up after the fall with help from his son. They helped him back inside to his chair where he rested and within 30 mins, wife came back and found him slightly slumped and hard to arouse. EMS called, and given fluids en route.  When talking to the pt, he states he does not remember the events, or anything leading up to it. Pt and wife denies new neurological deficits including slurred speech, vision changes, headaches during events or now. They deny bowel or bladder incontinence or movements during episodes. No current chest pain, abd pain, palpitations, N/V, SHOB, PND, DOE, or dizziness.   No  recent changes in medications, good medication compliance. Last lorazepam use was "a couple weeks ago" and he only uses it "once in a while."   ED course:  BP 105-115/ 60-75's, HR 80's.  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  Tobacco- Denies ever using  EtOH- Denies ever using  Illicit drug use- Denies ever using  IADLs/ADLs- Requires assistance at baseline   Review of Systems: A complete ROS was negative except as per HPI.    Physical Exam: Blood pressure 113/74, pulse (!) 108, temperature 97.8 F (36.6 C), resp. rate  16, height 5\' 8"  (1.727 m), weight 95.3 kg, SpO2 98 %.  Constitutional: Patient is awake, alert, oriented x 4 HENT: normocephalic, atraumatic, mucous membranes moist,  Eyes: conjunctiva non-erythematous, EOMI Cardiovascular: RRR, no m/r/g, non-edematous bilateral LE Pulmonary/Chest: normal work of breathing on room air, LCTAB Abdominal: soft, not TTP, non-distended MSK: normal bulk and tone Neurological: A&O x 3 Neurological: CN grossly intact, facial movement symmetric, midline tongue. Motor strength 4/5 on bilateral UEs & LEs. Sensation is grossly intact bilateral UEs & LEs. No dysdiadokinesia.  Skin: warm and dry Psych: normal behavior, normal affect   EKG: rate control a fib 75's  CXR: negative   Assessment & Plan by Problem: Active Problems:   * No  active hospital problems. *  Edwin Wall is a 80 y.o. 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    Syncope in setting of new onset Afib in 07/2020  Syncopal event resulting in head trauma, and diminished responsiveness. CT head and spine negative. Echo 5/22 with LVEF 50 to 55%, mild left atrial enlargement, mild MR. EKG with rate controlled A fib, rates 75's. Chadvasc score of 5, on Eliquis 2.5 bid at home and scheduled for cardioversion on Tuesday. Pt denies palpitations. Trop 24, but EKG without ST changes. BP soft 105/60's on arrival, cardiogenic syncope likely; 2D echo pending. Has hx of recent stroke in s/o new onset Afib in May with mild residual expressive aphasia and R hemiparesis; low suspicion for embolic stroke given on Eliquis and no new focal neurological deficits. Seizure lower on ddx given no movement, rapid return to baseline, and no trauma noted.  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 Not grossly fluid overload on exam. Denies DOE, SHOB, and orthopnea. LCTAB. 2+ pitting edema on bilateral lower edema.  - HOLD Farxiga in setting of soft BP's - 2D echo pending   Prediabetes A1c 7.1 (07/2020)  - A1c pending  - CBG monitoring 4 times a day     Best Practice: Diet: Heart Healthy IVF: NS,125cc/hr VTE: Eliquis  Code: Full   Lajean Manes, MD  Internal Medicine Resident, PGY-1 [facility]  Pager: [phone] 5:18 PM, 12/23/2020

## 2020-12-23 NOTE — Progress Notes (Signed)
   12/23/20 1517  Clinical Encounter Type  Visited With Patient not available  Visit Type Initial  Referral From Nurse  Consult/Referral To Chaplain   Chaplain attempted to check-in for Level 2 trauma. No response. Chaplain currently on another call. Please page if Chaplain needed.  This note was prepared by Marijo File, MDiv. Chaplain remains available as needed through the on-call pager: [phone].

## 2020-12-23 NOTE — Hospital Course (Addendum)
Patient Summary Edwin Wall is an 80 year old male with a history of stage 3 colon cancer s/p partial colectomy, CKD stage 3, recent L MCA CVA with residual aphasia and R hemiparesis improving with rehab, diabetes mellitus (diagnosed May 2022), new onset atrial fibrillation (diagnosed May 2022) on Apixaban, and hypertension who presented to the ED on 10/23 after a witnessed syncopal episode with head trauma. Hospital course by problem is summarized below.  ED Course Patient was alert, ambulatory, and appropriately interactive at his baseline in the ED. He was unable to recall the syncopal event. He had no new neurological deficits and denied chest pain, palpitations, headache, nausea, vomiting, shortness of breath, and bowel or bladder incontinence. His blood pressure was noted to be low with systolic 43-329. Troponin was trended and remained stable in the mid-20s. EKG with no evidence of ST-elevation. CBC was wnl with Hgb 14.7 and WBC 6.9. BMP with K+ of 3.1, glucose of 127. He was given IV fluids and K+ repleted. CT head and spine were negative. Patient was admitted for continued observation.   #Syncope vs mechanical fall  Syncopal event resulting in head trauma and diminished responsiveness. Imaging and labs negative and reassuring. Low suspicon for embolic stroke since patient takes Apixaban and presented with no new focal neurological deficits. Cause of syncope may have been due to orthostasis given patient's soft BP in the ED, vasovagal response, or a mechanical fall from the back porch. His wife reports that they both have had multiple falls on the back porch steps and do not have supportive railing. Echo was obtained to rule out structural cardiac cause, and showed LVEF 45-50, LV mildly decreased function, lV demonstrates regional wall motion abnormalities, with sever akinesis of the LV and apical anteroseptal wall and apical segment. Prior Echo from May with LVEF 50-55% and mildly dilated left  atrium. Given this change, cardiology was consulted (Dr Arlean Hopping), recommending outpatient cardiology/ischemic workup as pt remains asymptomatic and without new deficits, and flat trops.  Amantadine and Lorazepam were held during his stay. Apixaban 2.5 BID continued, as this is what his cardiologist recommends. He is scheduled to follow up with cardiology tomorrow.   #L MCA CVA w/ residual aphasia and R hemiparesis  No new focal neurological deficits. LDL 127, rosuvastatin 40mg  daily was continued. PT and OT were consulted.  PT recommended outpatient PT follow-up, OT did not recommend any services.   #T2DM  A1c here 6.2, was 7.1 in May. CBG was monitored and remained appropriate, ranging from 102-176. Wilder Glade was held in the setting of soft BP.    Follow-up Patient has outpatient PT and neurology arranged. He has cardiology follow-up at the afib clinic tomorrow to discuss potential cardioversion for his afib.

## 2020-12-23 NOTE — ED Notes (Signed)
Patient transported to CT 

## 2020-12-23 NOTE — ED Provider Notes (Signed)
Emergency Medicine Provider Triage Evaluation Note  Edwin Wall , a 80 y.o. male  was evaluated in triage. f Pt complains of fall today.  Patient reports he was walking out to the garage when he had an episode of dizziness/lightheadedness and fell forward hitting his head on the grass.  Patient is on Eliquis.  Has a history of a recent stroke in May and is on a blood thinner for atrial fibrillation.  He denies any chest pain or shortness of breath before that accident or after denies any headache.  Denies any weakness on one side of his body more than the other.  He was able to ambulate in to the emergency department today.  Review of Systems  Positive: Dizziness, lightheadedness Negative: Pain, shortness of breath, headache, unilateral weakness, blurry vision, palpitations  Physical Exam  BP 131/79   Pulse 71   Temp 98.2 F (36.8 C) (Oral)   Resp 16   SpO2 99%  Gen:   Awake, no distress   Resp:  Normal effort  MSK:   Moves extremities without difficulty. No focal weakness. Patient has equal strength to bilateral upper and lower extremities. No sensory deficit.   Other:  No step-offs or deformities noted to scalp.  No obvious abrasion or skin changes located.  No C-spine midline tenderness.  Regular rate.  Cranial nerves II through XII grossly intact.  Medical Decision Making  Medically screening exam initiated at 3:21 PM.  Appropriate orders placed.  Edwin Wall was informed that the remainder of the evaluation will be completed by another provider, this initial triage assessment does not replace that evaluation, and the importance of remaining in the ED until their evaluation is complete.  Syncope work-up with head CT and patient on blood thinners with head injury   Sherrell Puller, PA-C 12/23/20 [place]    Isla Pence, MD 12/23/20 [phone]

## 2020-12-23 NOTE — ED Notes (Addendum)
Trauma RN and Dr. Gilford Raid EDP at [facility]. Blood and EKG obtained on arrival during triage. Wife at [facility]. Pt alert, NAD, calm, interactive, resps e/u, speaking in clear complete sentences.

## 2020-12-23 NOTE — ED Notes (Signed)
Back from CT

## 2020-12-23 NOTE — ED Provider Notes (Signed)
[facility] EMERGENCY DEPARTMENT Provider Note   CSN: [id number] Arrival date & time: 12/23/20  1428     History No chief complaint on file.   Edwin Wall is a 80 y.o. male.  Pt presents to the ED today with a syncopal event.  Pt said he was walking to the garage and had a syncopal event.  He fell forward hitting his head on the grass.  Pt is on Eliquis for a hx of afib.  Pt's family brought him inside and he had another episode of syncope.  At that time, EMS was called.  Pt denies any pain and feels better now.      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   AKI (acute kidney injury) ([place])    Prediabetes    New onset atrial fibrillation ([place])    Dysphagia    Hyperlipidemia 07/16/2020   Type II diabetes mellitus ([place]) 07/16/2020   Atrial fibrillation ([place]) 07/16/2020   Acute CVA (cerebrovascular accident) ([place]) 07/15/2020   Cancer of sigmoid colon ([place]) 12/17/2016   Obesity (BMI 30-39.9) 01/15/2011   Hypertension 01/15/2011   History of gout 01/15/2011   ED (erectile dysfunction) 01/15/2011    Past Surgical History:  Procedure Laterality Date   COLECTOMY  12/17/2016   lap; partial sigmoid colectomy/notes 12/17/2016   COLONOSCOPY W/ BIOPSIES AND POLYPECTOMY  11/11/2016   LAPAROSCOPIC SIGMOID COLECTOMY [name] 12/17/2016   Procedure: LAPAROSCOPIC SIGMOID COLECTOMY;  Surgeon: [name], MD;  Location: [facility] OR;  Service: General;  Laterality: [name];   NASAL SEPTUM SURGERY     TONSILLECTOMY          Family History  Problem Relation Age of Onset   Cancer Mother        LUNG   COPD Father     Social History   Tobacco Use   Smoking status: Never   Smokeless tobacco: Never  Vaping Use   Vaping Use: Never used  Substance Use Topics   Alcohol use: No   Drug use: No    Home Medications Prior to Admission medications   Medication Sig Start Date End Date Taking? Authorizing Provider  apixaban (ELIQUIS) 5 MG TABS tablet Take 5 mg by mouth 2 (two) times daily.   Yes [provider]  colestipol (COLESTID) 1 g tablet Take 1 tablet (1 g total) by mouth 2 (two) times daily. Patient taking differently: Take 1 g by mouth every morning. 08/01/20  Yes Love, Ivan Anchors, PA-C  amantadine (SYMMETREL) 100 MG capsule Take 2 capsules (200 mg total) by mouth daily. For aphasia 12/14/20   Courtney Heys, MD  apixaban (ELIQUIS) 2.5 MG TABS tablet Take 1 tablet (2.5 mg total) by mouth 2 (two) times daily. Patient not taking: Reported on 12/23/2020 12/20/20   Sueanne Margarita, MD  dapagliflozin propanediol (FARXIGA) 10 MG TABS tablet Take 1 tablet (10 mg total) by  mouth daily. 08/02/20   Love, Ivan Anchors, PA-C  LORazepam (ATIVAN) 1 MG tablet Take 1 tablet (1 mg total) by mouth daily as needed for anxiety. 08/01/20   Love, Ivan Anchors, PA-C  rosuvastatin (CRESTOR) 40 MG tablet Take 1 tablet (40 mg total) by mouth daily. 08/01/20   Love, Ivan Anchors, PA-C  senna-docusate (SENOKOT-S) 8.6-50 MG tablet Take 1 tablet by mouth 2 (two) times daily. 08/01/20   Bary Leriche, PA-C    Allergies    Patient has no known allergies.  Review of Systems   Review of Systems  Neurological:  Positive for syncope.  All other systems reviewed and are negative.  Physical Exam Updated Vital Signs BP 113/74   Pulse (!) 108   Temp 97.8 F (36.6 C)   Resp 16   Ht 5\' 8"  (1.727 m)   Wt 95.3 kg   SpO2 98%   BMI 31.93 kg/m   Physical Exam Vitals and nursing note reviewed.  Constitutional:      Appearance: Normal  appearance.  HENT:     Head: Normocephalic and atraumatic.     Right Ear: External ear normal.     Left Ear: External ear normal.     Nose: Nose normal.     Mouth/Throat:     Mouth: Mucous membranes are moist.     Pharynx: Oropharynx is clear.  Eyes:     Extraocular Movements: Extraocular movements intact.     Conjunctiva/sclera: Conjunctivae normal.     Pupils: Pupils are equal, round, and reactive to light.  Cardiovascular:     Rate and Rhythm: Normal rate and regular rhythm.     Pulses: Normal pulses.     Heart sounds: Normal heart sounds.  Pulmonary:     Effort: Pulmonary effort is normal.     Breath sounds: Normal breath sounds.  Abdominal:     General: Abdomen is flat. Bowel sounds are normal.     Palpations: Abdomen is soft.  Musculoskeletal:        General: Normal range of motion.     Cervical back: Normal range of motion and neck supple.  Skin:    General: Skin is warm.     Capillary Refill: Capillary refill takes less than 2 seconds.  Neurological:     General: No focal deficit present.     Mental Status: He is alert and oriented to person, place, and time.  Psychiatric:        Mood and Affect: Mood normal.        Behavior: Behavior normal.    ED Results / Procedures / Treatments   Labs (all labs ordered are listed, but only abnormal results are displayed) Labs Reviewed  BASIC METABOLIC PANEL - Abnormal; Notable for the following components:      Result Value   Potassium 3.1 (*)    Glucose, Bld 127 (*)    Creatinine, Ser 1.63 (*)    GFR, Estimated 42 (*)    All other components within normal limits  PROTIME-INR - Abnormal; Notable for the following components:   Prothrombin Time 17.5 (*)    INR 1.4 (*)    All other components within normal limits  TROPONIN I (HIGH SENSITIVITY) - Abnormal; Notable for the following components:   Troponin I (High Sensitivity) 24 (*)    All other components within normal limits  RESP PANEL BY RT-PCR (FLU A&B, COVID)  ARPGX2  CBC  URINALYSIS, ROUTINE W REFLEX MICROSCOPIC  MAGNESIUM  CBG MONITORING, ED  TROPONIN I (HIGH SENSITIVITY)    EKG EKG Interpretation  Date/Time:  Sunday December 23 2020 14:49:34 EDT Ventricular Rate:  84 PR Interval:    QRS Duration: 136 QT Interval:  410 QTC Calculation: 484 R Axis:   -76 Text Interpretation: Atrial fibrillation Right bundle branch block Left anterior fascicular block Possible Lateral infarct , age undetermined Abnormal ECG No significant change since last tracing Reconfirmed by Isla Pence [phone]) on 12/23/2020 4:13:11 PM  Radiology CT HEAD WO CONTRAST  Result Date: 12/23/2020 CLINICAL DATA:  Neck trauma (Age >= 65y); Head trauma, minor (Age >= 65y) EXAM: CT HEAD WITHOUT CONTRAST CT CERVICAL SPINE WITHOUT CONTRAST TECHNIQUE: Multidetector CT imaging of the head and cervical spine was performed following the standard protocol without intravenous contrast. Multiplanar CT image reconstructions of the cervical spine were also generated. COMPARISON:  November 24, 2020 FINDINGS: CT HEAD FINDINGS Brain: No evidence of acute infarction, hemorrhage, hydrocephalus, extra-axial collection or mass lesion/mass effect. Revisualization of sequela of prior LEFT MCA and LEFT basal ganglia infarction. Global parenchymal volume loss. Vascular: Vascular calcifications. Skull: No acute fracture. Sinuses/Orbits: Chronic appearing opacification with corresponding osseous changes of the sphenoid sinuses most consistent with chronic sinusitis. Trace bilateral mastoid effusions. Other: Bilateral ear cerumen. CT CERVICAL SPINE FINDINGS Alignment: No spondylolisthesis. Unchanged rating the cervical lordosis. Skull base and vertebrae: Unchanged lucency of C2 likely reflecting degenerative changes. No acute fracture. Soft tissues and spinal canal: No prevertebral fluid or swelling. No visible canal hematoma. Disc levels: Posterior disc osteophyte complex at C6-7 results in moderate to  severe canal narrowing at this level. Posterior disc osteophyte complex at C3-4 results in moderate narrowing. 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 Multilevel intervertebral disc space height loss with uncovertebral hypertrophy and facet arthropathy. Multilevel osseous neuroforaminal narrowing most pronounced at C3-4 and C5-6 bilaterally. Upper chest: Negative. Other: None IMPRESSION: 1.  No acute intracranial abnormality. 2.  No acute fracture or static subluxation of the cervical spine. Electronically Signed   By: Valentino Saxon M.D.   On: 12/23/2020 16:11   CT CERVICAL SPINE WO CONTRAST  Result Date: 12/23/2020 CLINICAL DATA:  Neck trauma (Age >= 65y); Head trauma, minor (Age >= 65y) EXAM: CT HEAD WITHOUT CONTRAST CT CERVICAL SPINE WITHOUT CONTRAST TECHNIQUE: Multidetector CT imaging of the head and cervical spine was performed following the standard protocol without intravenous contrast. Multiplanar CT image reconstructions of the cervical spine were also generated. COMPARISON:  November 24, 2020 FINDINGS: CT HEAD FINDINGS Brain: No evidence of acute infarction, hemorrhage, hydrocephalus, extra-axial collection or mass lesion/mass effect. Revisualization of sequela of prior LEFT MCA and LEFT basal ganglia infarction. Global parenchymal volume loss. Vascular: Vascular calcifications. Skull: No acute fracture. Sinuses/Orbits: Chronic appearing opacification with corresponding osseous changes of the sphenoid sinuses most consistent with chronic sinusitis. Trace bilateral mastoid effusions. Other: Bilateral ear cerumen. CT CERVICAL SPINE FINDINGS Alignment: No spondylolisthesis. Unchanged rating the cervical lordosis. Skull base and vertebrae: Unchanged lucency of C2 likely reflecting degenerative changes. No acute fracture. Soft tissues and spinal canal: No prevertebral fluid or swelling. No visible canal hematoma. Disc levels: Posterior disc osteophyte complex at C6-7 results in moderate to severe  canal narrowing at this level. Posterior disc osteophyte complex at C3-4 results in moderate narrowing. 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 Multilevel intervertebral disc space height loss with uncovertebral hypertrophy and facet arthropathy. Multilevel osseous neuroforaminal narrowing most pronounced at C3-4 and C5-6 bilaterally. Upper chest: Negative. Other: None IMPRESSION: 1.  No acute intracranial abnormality. 2.  No  acute fracture or static subluxation of the cervical spine. Electronically Signed   By: Valentino Saxon M.D.   On: 12/23/2020 16:11    Procedures Procedures   Medications Ordered in ED Medications  sodium chloride 0.9 % bolus 1,000 mL (0 mLs Intravenous Stopped 12/23/20 1611)    And  0.9 %  sodium chloride infusion ( Intravenous New Bag/Given 12/23/20 1611)  potassium chloride SA (KLOR-CON) CR tablet 40 mEq (has no administration in time range)  sodium chloride 0.9 % bolus 1,000 mL (has no administration in time range)    ED Course  I have reviewed the triage vital signs and the nursing notes.  Pertinent labs & imaging results that were available during my care of the patient were reviewed by me and considered in my medical decision making (see chart for details).    MDM Rules/Calculators/A&P                           No evidence of traumatic injury.  BP slightly low.  He is given additional fluids.  Pt is a very high risk for (8 points on canadian syncope risk score) for a serious adverse event.  K low (3.1).  Pt given 40 meq po.  Mg level ordered.  Pt d/w IMTS for admission.   Final Clinical Impression(s) / ED Diagnoses Final diagnoses:  Syncope, unspecified syncope type  Hypokalemia  On apixaban therapy    Rx / DC Orders ED Discharge Orders     None        Isla Pence, MD 12/23/20 1826

## 2020-12-24 ENCOUNTER — Observation Stay (HOSPITAL_BASED_OUTPATIENT_CLINIC_OR_DEPARTMENT_OTHER): Payer: Medicare Other

## 2020-12-24 DIAGNOSIS — R55 Syncope and collapse: Secondary | ICD-10-CM

## 2020-12-24 DIAGNOSIS — S0990XA Unspecified injury of head, initial encounter: Secondary | ICD-10-CM | POA: Diagnosis not present

## 2020-12-24 LAB — BASIC METABOLIC PANEL
Anion gap: 7 (ref 5–15)
BUN: 11 mg/dL (ref 8–23)
CO2: 24 mmol/L (ref 22–32)
Calcium: 8.3 mg/dL — ABNORMAL LOW (ref 8.9–10.3)
Chloride: 112 mmol/L — ABNORMAL HIGH (ref 98–111)
Creatinine, Ser: 1.39 mg/dL — ABNORMAL HIGH (ref 0.61–1.24)
GFR, Estimated: 51 mL/min — ABNORMAL LOW (ref >60)
Glucose, Bld: 135 mg/dL — ABNORMAL HIGH (ref 70–99)
Potassium: 3.3 mmol/L — ABNORMAL LOW (ref 3.5–5.1)
Sodium: 143 mmol/L (ref 135–145)

## 2020-12-24 LAB — ECHOCARDIOGRAM COMPLETE
Calc EF: 51.2 %
Height: 68 in
S' Lateral: 3.5 cm
Single Plane A2C EF: 50.5 %
Single Plane A4C EF: 49.9 %
Weight: 3360 oz

## 2020-12-24 LAB — CBC
HCT: 41.4 % (ref 39.0–52.0)
Hemoglobin: 13 g/dL (ref 13.0–17.0)
MCH: 30.7 pg (ref 26.0–34.0)
MCHC: 31.4 g/dL (ref 30.0–36.0)
MCV: 97.6 fL (ref 80.0–100.0)
Platelets: 159 10*3/uL (ref 150–400)
RBC: 4.24 MIL/uL (ref 4.22–5.81)
RDW: 13.8 % (ref 11.5–15.5)
WBC: 7.8 10*3/uL (ref 4.0–10.5)
nRBC: 0 % (ref 0.0–0.2)

## 2020-12-24 LAB — CBG MONITORING, ED
Glucose-Capillary: 176 mg/dL — ABNORMAL HIGH (ref 70–99)
Glucose-Capillary: 131 mg/dL — ABNORMAL HIGH (ref 70–99)
Glucose-Capillary: 130 mg/dL — ABNORMAL HIGH (ref 70–99)

## 2020-12-24 LAB — MAGNESIUM: Magnesium: 1.9 mg/dL (ref 1.7–2.4)

## 2020-12-24 LAB — TROPONIN I (HIGH SENSITIVITY): Troponin I (High Sensitivity): 26 ng/L — ABNORMAL HIGH (ref <18)

## 2020-12-24 MED ORDER — IPRATROPIUM-ALBUTEROL 0.5-2.5 (3) MG/3ML IN SOLN
3.0000 mL | Freq: Four times a day (QID) | RESPIRATORY_TRACT | Status: DC | PRN
  Administered 2020-12-24 (×2): 3 mL via RESPIRATORY_TRACT
  Filled 2020-12-24 (×2): qty 3

## 2020-12-24 MED ORDER — POTASSIUM CHLORIDE CRYS ER 20 MEQ PO TBCR
40.0000 meq | EXTENDED_RELEASE_TABLET | Freq: Two times a day (BID) | ORAL | Status: DC
  Filled 2020-12-24: qty 2

## 2020-12-24 MED ORDER — POTASSIUM CHLORIDE 10 MEQ/100ML IV SOLN
10.0000 meq | INTRAVENOUS | Status: AC
  Administered 2020-12-24 (×6): 10 meq via INTRAVENOUS
  Filled 2020-12-24 (×6): qty 100

## 2020-12-24 MED ORDER — APIXABAN 5 MG PO TABS
5.0000 mg | ORAL_TABLET | Freq: Two times a day (BID) | ORAL | Status: DC
  Administered 2020-12-24: 5 mg via ORAL
  Filled 2020-12-24: qty 1

## 2020-12-24 MED ORDER — PERFLUTREN LIPID MICROSPHERE
1.0000 mL | INTRAVENOUS | Status: AC | PRN
  Administered 2020-12-24: 4 mL via INTRAVENOUS
  Filled 2020-12-24: qty 10

## 2020-12-24 NOTE — Evaluation (Signed)
Occupational Therapy Evaluation Patient Details Name: Edwin Wall MRN: [id number] DOB: 08-02-1940 Today's Date: 12/24/2020   History of Present Illness 80 y/o male admitted 10/23 secondary to sycope and fall. PMH includes colon cancer, CKD, pre-diabetes and a fib.   Clinical Impression   PTA, pt was living with his wife and was independent with BADLs; wife assisting with IADLs. Pt currently requiring Min Guard-Min A for LB ADLs and Min Guard A for functional mobility with RW. Pt presenting with decreased balance, cognition, and safety. Feel pt is close to baseline and would benefit from recommended OP PT. Will continue to follow acutely as admitted to facilitate safe dc. Recommend dc to home once medically stable per physician.       Recommendations for follow up therapy are one component of a multi-disciplinary discharge planning process, led by the attending physician.  Recommendations may be updated based on patient status, additional functional criteria and insurance authorization.   Follow Up Recommendations  No OT follow up    Assistance Recommended at Discharge Intermittent Supervision/Assistance  Functional Status Assessment  Patient has had a recent decline in their functional status and demonstrates the ability to make significant improvements in function in a reasonable and predictable amount of time.  Equipment Recommendations  None recommended by OT    Recommendations for Other Services PT consult     Precautions / Restrictions Precautions Precautions: Other (comment);Fall Precaution Comments: Watch HR Restrictions Weight Bearing Restrictions: No      Mobility Bed Mobility Overal bed mobility: Needs Assistance Bed Mobility: Supine to Sit;Sit to Supine   Sidelying to sit: Min assist Supine to sit: Min guard Sit to supine: Min assist   General bed mobility comments: MinGuard A for safety to sit at EOB. Min A to bring BLES over EOB in returning to  supine    Transfers Overall transfer level: Needs assistance Equipment used: None Transfers: Sit to/from Stand Sit to Stand: Min guard           General transfer comment: Min Guard A for safety      Balance Overall balance assessment: Needs assistance Sitting-balance support: No upper extremity supported;Feet supported Sitting balance-Leahy Scale: Fair     Standing balance support: No upper extremity supported;During functional activity Standing balance-Leahy Scale: Fair                             ADL either performed or assessed with clinical judgement   ADL Overall ADL's : Needs assistance/impaired Eating/Feeding: Set up;Sitting   Grooming: Set up;Supervision/safety;Sitting   Upper Body Bathing: Set up;Supervision/ safety;Sitting   Lower Body Bathing: Min guard;Sit to/from stand   Upper Body Dressing : Set up;Supervision/safety;Sitting   Lower Body Dressing: Minimal assistance;Sit to/from stand Lower Body Dressing Details (indicate cue type and reason): pt donning shoes. However, requiring assistance to tie shoe laces Toilet Transfer: Min guard;Stand-pivot (simulated to chair)           Functional mobility during ADLs: Min guard;Rolling walker (2 wheels) General ADL Comments: Pt presenting with decreased balance and cognition. Feel he is not far from baseline cognition (s/p CVA in May)     Vision Baseline Vision/History: 1 Wears glasses (readers)       Perception     Praxis      Pertinent Vitals/Pain Pain Assessment: No/denies pain Breathing: normal Negative Vocalization: none Facial Expression: smiling or inexpressive Body Language: relaxed Consolability: no need to console PAINAD Score: 0  Hand Dominance Left ("im both")   Extremity/Trunk Assessment Upper Extremity Assessment Upper Extremity Assessment: Generalized weakness (Slight weakness on R side)   Lower Extremity Assessment Lower Extremity Assessment: Defer to PT  evaluation   Cervical / Trunk Assessment Cervical / Trunk Assessment: Normal   Communication Communication Communication: No difficulties   Cognition Arousal/Alertness: Awake/alert Behavior During Therapy: WFL for tasks assessed/performed Overall Cognitive Status: History of cognitive impairments - at baseline                                       General Comments  Wife present throughout. BP stable throughout    Exercises     Shoulder Instructions      Home Living Family/patient expects to be discharged to:: Private residence Living Arrangements: Spouse/significant other;Children Available Help at Discharge: Family;Available 24 hours/day Type of Home: House Home Access: Stairs to enter [employer] of Steps: 5 in front of home and 2 in back of home Entrance Stairs-Rails: Right;Left;Can reach both Home Layout: One level     Bathroom Shower/Tub: Walk-in shower;Tub/shower unit   Bathroom Toilet: Handicapped height     Home Equipment: Cane - single point;Shower Land (2 wheels);Rollator (4 wheels)          Prior Functioning/Environment Prior Level of Function : Independent/Modified Independent             Mobility Comments: Does not use DME ADLs Comments: independent with ADLs. Wife does IADLs and driving        OT Problem List: Decreased strength;Decreased range of motion;Decreased activity tolerance;Impaired balance (sitting and/or standing);Decreased knowledge of use of DME or AE;Decreased knowledge of precautions      OT Treatment/Interventions: Self-care/ADL training;Therapeutic exercise;Energy conservation;DME and/or AE instruction;Therapeutic activities;Patient/family education    OT Goals(Current goals can be found in the care plan section) Acute Rehab OT Goals Patient Stated Goal: Go home OT Goal Formulation: With patient Time For Goal Achievement: 01/07/21 Potential to Achieve Goals: Good  OT Frequency:  Min 2X/week   Barriers to D/C:            Co-evaluation              AM-PAC OT "6 Clicks" Daily Activity     Outcome Measure Help from another person eating meals?: None Help from another person taking care of personal grooming?: A Little Help from another person toileting, which includes using toliet, bedpan, or urinal?: A Little Help from another person bathing (including washing, rinsing, drying)?: A Little Help from another person to put on and taking off regular upper body clothing?: A Little Help from another person to put on and taking off regular lower body clothing?: A Little 6 Click Score: 19   End of Session Equipment Utilized During Treatment: Rolling walker (2 wheels) Nurse Communication: Mobility status  Activity Tolerance: Patient tolerated treatment well Patient left: in bed;with call bell/phone within reach;with family/visitor present  OT Visit Diagnosis: Other abnormalities of gait and mobility (R26.89);Unsteadiness on feet (R26.81);Muscle weakness (generalized) (M62.81)                Time: 2836-6294 OT Time Calculation (min): 22 min Charges:  OT General Charges $OT Visit: 1 Visit OT Evaluation $OT Eval Low Complexity: [place], OTR/L Acute Rehab Pager: [phone] Office: [place] 12/24/2020, 12:01 PM

## 2020-12-24 NOTE — ED Notes (Signed)
Admitting MD team at [facility].

## 2020-12-24 NOTE — ED Notes (Signed)
Breakfast Orders placed 

## 2020-12-24 NOTE — Progress Notes (Signed)
Date: 12/24/2020  Patient name: Edwin Wall  Medical record number: [id number]  Date of birth: 08-18-1940   I have seen and evaluated Wylie Hail Kerin and discussed their care with the Residency Team.  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  History obtained from patient and wife.  Wife states the patient was carrying a heavy load and walking down the back porch when he fell.  Wife states that he did not lose consciousness and was able to get up with help from his son.  Patient came back inside and rested on a chair.  30 minutes later wife came back and found him difficult to arouse and called EMS.  Patient states he does not remember the event but also states that he did not lose consciousness.  No chest pain, no shortness of breath, no palpitations, no lightheadedness, no loss of consciousness, no fevers or chills, no headache, no blurry vision, no focal weakness, no tingling or numbness, no nausea or vomiting, no diarrhea.  Wife and patient states that they have had multiple falls off the back porch and need to get it repaired.  Patient blood pressures were noted to be on the lower side on admission with SBP's in the 90s to 100s.  Today, patient states that he feels well and denies any new complaints.  PMHx, Fam Hx, and/or Soc Hx : As per resident admit note  Vitals:   12/24/20 1300 12/24/20 1315  BP:    Pulse: 96 88  Resp: 19 17  Temp:    SpO2: 100% 97%   General: Awake, alert, oriented x3, NAD CVS: Irregularly irregular, normal heart sounds Lungs: CTA bilaterally Abdomen: Soft, nontender, nondistended, normoactive bowel sounds Extremities: 1+ bilateral lower extremity pitting edema noted on exam, nontender to palpation Psych: Normal mood  and affect HEENT: Normocephalic, atraumatic Skin: Warm and dry  Assessment and Plan: I have seen and evaluated the patient as outlined above. I agree with the formulated Assessment and Plan as detailed in the residents' note, with the following changes:   1.  Difficult to arouse status post fall: -Patient presented to the ED after a fall off the back porch and being difficult to arouse approximately 30 minutes later.  Patient had no episodes of loss of consciousness and no evidence of syncope.  There is some concern for cardiogenic etiology for his syncope but his heart rates have been well controlled on telemetry and are currently in the 80s to 90s and his EKG showed A. fib with no acute ischemic changes.  Given that patient's blood pressures were on the lower end on admission and improved with fluids I do suspect that dehydration may have played a role in the fall. -We will follow-up results of 2D echo -High-sensitivity troponins with only mild elevation in the 20s -Continue to monitor on telemetry -Continue Eliquis 2.5 mg twice daily -We will continue to hold lorazepam at this time -We will resume amantadine on DC -PT/OT follow-up and recommendations appreciated.  Patient to follow-up with outpatient PT -Patient to follow-up in A. fib clinic tomorrow.  Wife was worried that he was scheduled for cardioversion tomorrow but does not know enough about the procedure.  We clarified with cardiology that there is no procedure scheduled for tomorrow and  the appointment is to establish care in the A. fib clinic. -No further work-up at this time -Patient stable for DC home today  Aldine Contes, MD 10/24/20222:17 PM

## 2020-12-24 NOTE — ED Notes (Signed)
Echo at [facility]. Wife at [facility]. Pt alert, NAD, calm, interactive.

## 2020-12-24 NOTE — Discharge Instructions (Addendum)
Edwin Wall you were admitted to [facility] Internal Medicine Service because of passing out or a fall. We did several labs and tests to rule out many life threatening things, as well as to determine the cause of your symptoms. We took pictures of your brain and spine which did not show any fractures.   We treated you by continuing your home medications and stopping your amantadine and lorazepam while here, because it can cause falls and confusion in the elderly population. You may continue taking these medications at home as you were previously, but be considerate of how you feel while taking these. You are improved significantly with just monitoring you, and we continued to monitor you until you were stable enough for discharge.   PLEASE continue to take your medications as prescribed by your doctor, with a particular clarification being: take eliquis 2.5 twice a day in stead of 5 mg twice a day, as suggested by cardiology. ALSO, please go to your cardiology appointment tomorrow to discuss possible cardioversion in the near future to restore normal sinus rhythm, and to discuss your new heart wall abnormality seen on today's echo to determine if your cardiologist recommends ischemic workup.   PLEASE do not miss any doses of your medications as it is very important in ensuring you continue to feel better and remain stable.   You need to follow up with your primary care doctor by calling their office within the next week to create an appointment to discuss the events of this hospitalization, and to determine the best management plan for you and your conditions, to prevent you from having to return to the hospital. ADDITIONALLY, please go/call for any other appointments made for you in this discharge information.  RETURN to the ED if you have similar or worsening symptoms, and do not feel like your normal self.

## 2020-12-24 NOTE — Progress Notes (Signed)
Spoke with cardiology Dr. Gasper Sells to discuss new decreased EF of 45-50% on Echo today with new LV apical anteroseptal akinesis compared to last echo on 07/16/2020. Felt that changes were likely due to atrial fibrillation with increased heart rates. Recommended outpatient cardiology workup and medical optimazation if patient is feeling well and clear other etiology for syncope that outpatient. Patient feeling well today, fall likely mechanical with component of dehydration. He will follow up with outpatient cardiologist Dr. Radford Pax.

## 2020-12-24 NOTE — Evaluation (Signed)
Physical Therapy Evaluation Patient Details Name: Edwin Wall MRN: [id number] DOB: 1940-05-21 Today's Date: 12/24/2020  History of Present Illness  Pt is an 80 y/o male admitted 10/23 secondary to sycope and fall. PMH includes colon cancer, CKD, pre-diabetes and a fib.  Clinical Impression  Pt admitted secondary to problem above with deficits below. Pt requiring min to min guard A for mobility tasks. Pt reporting increased dizziness and requesting to sit down. Checked BP in standing and BP WFL. Educated about using RW at home to increase safety. Discussed HHPT vs outpatient PT follow up and pt's wife requesting outpatient PT follow up. Will continue to follow acutely.        Recommendations for follow up therapy are one component of a multi-disciplinary discharge planning process, led by the attending physician.  Recommendations may be updated based on patient status, additional functional criteria and insurance authorization.  Follow Up Recommendations Outpatient PT    Assistance Recommended at Discharge Intermittent Supervision/Assistance  Functional Status Assessment Patient has had a recent decline in their functional status and demonstrates the ability to make significant improvements in function in a reasonable and predictable amount of time.  Equipment Recommendations  None recommended by PT    Recommendations for Other Services       Precautions / Restrictions Precautions Precautions: Other (comment);Fall Precaution Comments: Watch HR Restrictions Weight Bearing Restrictions: No      Mobility  Bed Mobility Overal bed mobility: Needs Assistance Bed Mobility: Sidelying to Sit;Supine to Sit   Sidelying to sit: Min assist Supine to sit: Supervision     General bed mobility comments: Min A for trunk elevation to come to sitting    Transfers Overall transfer level: Needs assistance Equipment used: None Transfers: Sit to/from Stand Sit to Stand: Min guard            General transfer comment: Min guard for safety.    Ambulation/Gait Ambulation/Gait assistance: Min guard Gait Distance (Feet): 2 Feet Assistive device: None Gait Pattern/deviations: Step-through pattern;Decreased stride length Gait velocity: Decreased   General Gait Details: Mild shakiness noted. Pt also reporting dizziness and requested to sit down. Checked BP and BP at 140s/low 70s. Educated about using RW at home for increased safety.  Stairs            Wheelchair Mobility    Modified Rankin (Stroke Patients Only)       Balance Overall balance assessment: Needs assistance Sitting-balance support: No upper extremity supported;Feet supported Sitting balance-Leahy Scale: Fair     Standing balance support: No upper extremity supported;During functional activity Standing balance-Leahy Scale: Fair                               Pertinent Vitals/Pain Pain Assessment: No/denies pain Breathing: normal Negative Vocalization: none Facial Expression: smiling or inexpressive Body Language: relaxed Consolability: no need to console PAINAD Score: 0    Home Living Family/patient expects to be discharged to:: Private residence Living Arrangements: Spouse/significant other;Children Available Help at Discharge: Family;Available 24 hours/day Type of Home: House Home Access: Stairs to enter Entrance Stairs-Rails: Right;Left;Can reach both (on front) Entrance Stairs-Number of Steps: 5 in front of home and 2 in back of home   Home Layout: One level Home Equipment: Cane - single point;Shower Land (2 wheels);Rollator (4 wheels)      Prior Function Prior Level of Function : Independent/Modified Independent;History of Falls (last six months)  Mobility Comments: Independent with ADLs ADLs Comments: Independent with bathing/dressing     Hand Dominance        Extremity/Trunk Assessment   Upper Extremity  Assessment Upper Extremity Assessment: Defer to OT evaluation    Lower Extremity Assessment Lower Extremity Assessment: Generalized weakness    Cervical / Trunk Assessment Cervical / Trunk Assessment: Normal  Communication   Communication: No difficulties  Cognition Arousal/Alertness: Awake/alert Behavior During Therapy: WFL for tasks assessed/performed Overall Cognitive Status: History of cognitive impairments - at baseline                                          General Comments General comments (skin integrity, edema, etc.): Pt's wife present during session. Discussed HHPT vs outpatient PT and pt's wife requesting outpatient PT    Exercises     Assessment/Plan    PT Assessment Patient needs continued PT services  PT Problem List Decreased strength;Decreased activity tolerance;Decreased balance;Decreased mobility;Decreased knowledge of use of DME;Decreased safety awareness;Decreased knowledge of precautions       PT Treatment Interventions DME instruction;Gait training;Functional mobility training;Stair training;Therapeutic exercise;Therapeutic activities;Balance training;Patient/family education    PT Goals (Current goals can be found in the Care Plan section)  Acute Rehab PT Goals Patient Stated Goal: to go home PT Goal Formulation: With patient Time For Goal Achievement: 01/07/21 Potential to Achieve Goals: Good    Frequency Min 3X/week   Barriers to discharge        Co-evaluation               AM-PAC PT "6 Clicks" Mobility  Outcome Measure Help needed turning from your back to your side while in a flat bed without using bedrails?: None Help needed moving from lying on your back to sitting on the side of a flat bed without using bedrails?: A Little Help needed moving to and from a bed to a chair (including a wheelchair)?: A Little Help needed standing up from a chair using your arms (e.g., wheelchair or bedside chair)?: A Little Help  needed to walk in hospital room?: A Little Help needed climbing 3-5 steps with a railing? : A Lot 6 Click Score: 18    End of Session Equipment Utilized During Treatment: Gait belt Activity Tolerance: Treatment limited secondary to medical complications (Comment) (dizziness) Patient left: in bed;with call bell/phone within reach;with family/visitor present (on stretcher in ED) Nurse Communication: Mobility status PT Visit Diagnosis: Unsteadiness on feet (R26.81);Muscle weakness (generalized) (M62.81);History of falling (Z91.81)    Time: 9030-0923 PT Time Calculation (min) (ACUTE ONLY): 18 min   Charges:   PT Evaluation $PT Eval Moderate Complexity: 1 Mod          Reuel Derby, PT, DPT  Acute Rehabilitation Services  Pager: [phone] Office: [phone]   Rudean Hitt 12/24/2020, 10:23 AM

## 2020-12-24 NOTE — ED Notes (Signed)
Ambulatory with PT

## 2020-12-24 NOTE — Discharge Summary (Signed)
Name: Edwin Wall MRN: [id number] DOB: 1940/04/24 80 y.o. PCP: [name], MD  Date of Admission: 12/23/2020  2:53 PM Date of Discharge:  12/24/20 Attending Physician: Dr. Dareen Piano  DISCHARGE DIAGNOSIS:  Primary Problem: Syncope vs mechanical fall   Hospital Problems: Active Problems:   Syncope    DISCHARGE MEDICATIONS:   Allergies as of 12/24/2020   No Known Allergies      Medication List     TAKE these medications    amantadine 100 MG capsule Commonly known as: SYMMETREL Take 2 capsules (200 mg total) by mouth daily. For aphasia What changed:  how much to take when to take this   apixaban 2.5 MG Tabs tablet Commonly known as: ELIQUIS Take 1 tablet (2.5 mg total) by mouth 2 (two) times daily. What changed: Another medication with the same name was removed. Continue taking this medication, and follow the directions you see here.   colestipol 1 g tablet Commonly known as: COLESTID Take 1 tablet (1 g total) by mouth 2 (two) times daily. What changed: when to take this   Farxiga 10 MG Tabs tablet Generic drug: dapagliflozin propanediol Take 1 tablet (10 mg total) by mouth daily.   LORazepam 1 MG tablet Commonly known as: ATIVAN Take 1 tablet (1 mg total) by mouth daily as needed for anxiety.   rosuvastatin 40 MG tablet Commonly known as: CRESTOR Take 1 tablet (40 mg total) by mouth daily.   Senexon-S 8.6-50 MG tablet Generic drug: senna-docusate Take 1 tablet by mouth 2 (two) times daily. What changed:  when to take this reasons to take this        DISPOSITION AND FOLLOW-UP:  Edwin Wall was discharged from [facility] in stable condition. At the hospital follow up visit please address:  Follow-up Recommendations: Consults: Cardiology follow up with Dr Radford Pax regarding worsened Echo compared to May, to consider possible ischemic workup; cardiology/a fib clinic soon to discuss cardioversion  Labs:  none  Studies: none  Medications: No changes in medications    Follow-up Appointments:  Follow-up Information     [name], MD. Schedule an appointment as soon as possible for a visit in 1 week(s).   Specialty: Family Medicine Why: to schedule a post hospitalization visit, and to determine whether any medications need to be adhusted for. Contact information: [place] [address] [phone]         Sueanne Margarita, MD. Go in 1 day(s).   Specialty: Cardiology Why: Go to your appt scheduled with your cardiolgist to further discuss cardioversion. Contact information: [address] 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 Hospital course by problem is summarized below.  ED Course Patient was alert, ambulatory, and appropriately interactive at his baseline in the ED. He was unable to recall the syncopal event. He had no new neurological deficits and denied chest pain, palpitations, headache, nausea, vomiting, shortness of breath, and bowel or bladder incontinence.  His blood pressure was noted to be low with systolic 37-858. Troponin was trended and remained stable in the mid-20s. EKG with no evidence of ST-elevation. CBC was wnl with Hgb 14.7 and WBC 6.9. BMP with K+ of 3.1, glucose of 127. He was given IV fluids and K+ repleted. CT head and spine were negative. Patient was admitted for continued observation.   #Syncope vs mechanical fall  Syncopal event resulting in head trauma  and diminished responsiveness. Imaging and labs negative and reassuring. Low suspicon for embolic stroke since patient takes Apixaban and presented with no new focal neurological deficits. Cause of syncope may have been due to orthostasis given patient's soft BP in the ED, vasovagal response, or a mechanical fall from the back porch. His wife reports that they both have had multiple falls on the back porch steps and do not have supportive railing. Echo was obtained to rule out structural cardiac cause, and showed LVEF 45-50, LV mildly decreased function, lV demonstrates regional wall motion abnormalities, with sever akinesis of the LV and apical anteroseptal wall and apical segment. Prior Echo from May with LVEF 50-55% and mildly dilated left atrium. Given this change, cardiology was consulted (Dr Arlean Hopping), recommending outpatient cardiology/ischemic workup as pt remains asymptomatic and without new deficits, and flat trops.  Amantadine and Lorazepam were held during his stay. Apixaban 2.5 BID continued, as this is what his cardiologist recommends. He is scheduled to follow up with cardiology tomorrow.   #L MCA CVA w/ residual aphasia and R hemiparesis  No new focal neurological deficits. LDL 127, rosuvastatin 40mg  daily was continued. PT and OT were consulted.  PT recommended outpatient PT follow-up, OT did not recommend any services.   #T2DM  A1c here 6.2, was 7.1 in May. CBG was monitored and remained appropriate, ranging from 102-176. Wilder Glade was held in the setting of soft BP.    Follow-up Patient has outpatient PT and neurology arranged. He has cardiology follow-up at the afib clinic tomorrow to discuss potential cardioversion for his afib.    DISCHARGE INSTRUCTIONS:   Discharge Instructions     Ambulatory referral to Physical Therapy   Complete by: As directed    Call MD for:  difficulty breathing, headache or visual disturbances   Complete by: As directed    Call MD for:   extreme fatigue   Complete by: As directed    Call MD for:  hives   Complete by: As directed    Call MD for:  persistant dizziness or light-headedness   Complete by: As directed    Call MD for:  persistant nausea and vomiting   Complete by: As directed    Call MD for:  redness, tenderness, or signs of infection (pain, swelling, redness, odor or green/yellow discharge around incision site)   Complete by: As directed    Call MD for:  severe uncontrolled pain   Complete by: As directed    Call MD for:  temperature >100.4   Complete by: As directed    Diet - low sodium heart healthy   Complete by: As directed    Increase activity slowly   Complete by: As directed        SUBJECTIVE:  No acute overnight events. Patient was seen at bedside during rounds this morning.   Pt reports feeling well this morning. He has no acute concerns. Pt denies new neurological deficits including slurred speech, vision changes, headaches. No current chest pain, abd pain, palpitations, N/V, SHOB, PND, DOE, or dizziness.  The pt and wife had a question regarding cardioversion for a fib, all questions were addressed, and the importance of following up with cardiology is emphasized.   No other complains or concerns at this time.   All questions were addressed with patient prior to being discharged.   Discharge Vitals:   BP (!) 163/97   Pulse 80   Temp 99.1 F (37.3 C) (Oral)   Resp 19   Ht 5\' 8"  (1.727 m)   Wt 95.3 kg   SpO2 97%   BMI 31.93 kg/m   OBJECTIVE:  Constitutional: Patient is awake, alert, oriented x 4 HENT: normocephalic, atraumatic, mucous membranes moist,  Eyes: conjunctiva non-erythematous, EOMI Cardiovascular: RRR, no m/r/g, non-edematous bilateral LE Pulmonary/Chest: normal work of breathing on room air, LCTAB Abdominal: soft, not TTP, non-distended MSK: normal bulk and tone Neurological: A&O x 3 Neurological: CN grossly intact, facial movement symmetric, midline tongue. Motor  strength 4/5 on bilateral UEs & LEs. Sensation is grossly intact bilateral UEs & LEs. No dysdiadokinesia.  Skin: warm and dry Psych: normal behavior, normal affect    Pertinent Labs, Studies, and Procedures:  CBC Latest Ref Rng & Units 12/24/2020 12/23/2020 11/24/2020  WBC 4.0 - 10.5 K/uL 7.8 6.9 6.6  Hemoglobin 13.0 - 17.0 g/dL 13.0 14.7 13.1  Hematocrit 39.0 - 52.0 % 41.4 46.0 40.6  Platelets 150 - 400 K/uL 159 193 181    CMP Latest Ref Rng & Units 12/24/2020 12/23/2020 12/18/2020  Glucose 70 - 99 mg/dL 135(H) 127(H) 162(H)  BUN 8 - 23 mg/dL 11 12 13   Creatinine 0.61 - 1.24 mg/dL 1.39(H) 1.63(H) 1.52(H)  Sodium 135 - 145 mmol/L 143 143 147(H)  Potassium 3.5 - 5.1 mmol/L 3.3(L) 3.1(L) 3.7  Chloride 98 - 111 mmol/L 112(H) 107 105  CO2 22 - 32 mmol/L 24 26 25   Calcium 8.9 - 10.3 mg/dL 8.3(L) 9.2 9.6  Total Protein 6.5 - 8.1 g/dL - - -  Total Bilirubin 0.3 - 1.2 mg/dL - - -  Alkaline Phos 38 - 126 U/L - - -  AST 15 - 41 U/L - - -  ALT 0 - 44 U/L - - -    CT HEAD WO CONTRAST  Result Date: 12/23/2020 CLINICAL DATA:  Neck trauma (Age >= 65y); Head trauma, minor (Age >= 65y) EXAM: CT HEAD WITHOUT CONTRAST CT CERVICAL SPINE WITHOUT CONTRAST TECHNIQUE: Multidetector CT imaging of the head and cervical spine was performed following the standard protocol without intravenous contrast. Multiplanar CT image reconstructions of the cervical spine were also generated. COMPARISON:  November 24, 2020 FINDINGS: CT HEAD FINDINGS Brain: No evidence of acute infarction, hemorrhage, hydrocephalus, extra-axial collection or mass lesion/mass effect. Revisualization of sequela of prior LEFT MCA and LEFT basal ganglia infarction. Global parenchymal volume loss. Vascular: Vascular calcifications. Skull: No acute fracture. Sinuses/Orbits: Chronic appearing opacification with corresponding osseous changes of the sphenoid sinuses most consistent with chronic sinusitis. Trace bilateral mastoid effusions. Other:  Bilateral ear cerumen. CT CERVICAL SPINE FINDINGS Alignment: No spondylolisthesis. Unchanged rating the cervical lordosis. Skull base and vertebrae: Unchanged lucency of C2 likely reflecting degenerative changes. No acute fracture. Soft tissues and spinal canal: No prevertebral fluid or swelling. No visible canal hematoma. Disc levels: Posterior disc osteophyte complex at C6-7 results in moderate to severe canal narrowing at this level. Posterior disc osteophyte complex at C3-4 results in moderate narrowing. 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 Multilevel intervertebral disc space height loss with uncovertebral hypertrophy and facet arthropathy. Multilevel osseous neuroforaminal narrowing most  pronounced at C3-4 and C5-6 bilaterally. Upper chest: Negative. Other: None IMPRESSION: 1.  No acute intracranial abnormality. 2.  No acute fracture or static subluxation of the cervical spine. Electronically Signed   By: Valentino Saxon M.D.   On: 12/23/2020 16:11   CT CERVICAL SPINE WO CONTRAST  Result Date: 12/23/2020 CLINICAL DATA:  Neck trauma (Age >= 65y); Head trauma, minor (Age >= 65y) EXAM: CT HEAD WITHOUT CONTRAST CT CERVICAL SPINE WITHOUT CONTRAST TECHNIQUE: Multidetector CT imaging of the head and cervical spine was performed following the standard protocol without intravenous contrast. Multiplanar CT image reconstructions of the cervical spine were also generated. COMPARISON:  November 24, 2020 FINDINGS: CT HEAD FINDINGS Brain: No evidence of acute infarction, hemorrhage, hydrocephalus, extra-axial collection or mass lesion/mass effect. Revisualization of sequela of prior LEFT MCA and LEFT basal ganglia infarction. Global parenchymal volume loss. Vascular: Vascular calcifications. Skull: No acute fracture. Sinuses/Orbits: Chronic appearing opacification with corresponding osseous changes of the sphenoid sinuses most consistent with chronic sinusitis. Trace bilateral mastoid effusions. Other: Bilateral  ear cerumen. CT CERVICAL SPINE FINDINGS Alignment: No spondylolisthesis. Unchanged rating the cervical lordosis. Skull base and vertebrae: Unchanged lucency of C2 likely reflecting degenerative changes. No acute fracture. Soft tissues and spinal canal: No prevertebral fluid or swelling. No visible canal hematoma. Disc levels: Posterior disc osteophyte complex at C6-7 results in moderate to severe canal narrowing at this level. Posterior disc osteophyte complex at C3-4 results in moderate narrowing. 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 Multilevel intervertebral disc space height loss with uncovertebral hypertrophy and facet arthropathy. Multilevel osseous neuroforaminal narrowing most pronounced at C3-4 and C5-6 bilaterally. Upper chest: Negative. Other: None IMPRESSION: 1.  No acute intracranial abnormality. 2.  No acute fracture or static subluxation of the cervical spine. Electronically Signed   By: Valentino Saxon M.D.   On: 12/23/2020 16:11   DG Chest Portable 1 View  Result Date: 12/23/2020 CLINICAL DATA:  Fall EXAM: PORTABLE CHEST 1 VIEW COMPARISON:  11/24/2020 FINDINGS: The heart size and mediastinal contours are within normal limits. Low lung volumes. No focal pulmonary abnormality. No pleural effusion the visualized skeletal structures are unremarkable. IMPRESSION: No active disease. Electronically Signed   By: Merilyn Baba M.D.   On: 12/23/2020 19:03      Lajean Manes, MD Internal Medicine Resident, PGY-1 [facility]  Pager: [phone]

## 2020-12-25 ENCOUNTER — Ambulatory Visit (HOSPITAL_COMMUNITY): Payer: Medicare Other | Admitting: Physician Assistant

## 2021-01-02 ENCOUNTER — Encounter (HOSPITAL_COMMUNITY): Payer: Self-pay | Admitting: Physician Assistant

## 2021-01-02 ENCOUNTER — Other Ambulatory Visit: Payer: Self-pay

## 2021-01-02 ENCOUNTER — Ambulatory Visit (HOSPITAL_COMMUNITY)
Admission: RE | Admit: 2021-01-02 | Discharge: 2021-01-02 | Disposition: A | Discharge status: Home / Self Care | Payer: Medicare Other | Source: Ambulatory Visit | Attending: Physician Assistant | Admitting: Physician Assistant

## 2021-01-02 VITALS — BP 144/92 | HR 90 | Ht 68.0 in | Wt 206.6 lb

## 2021-01-02 DIAGNOSIS — Z6831 Body mass index (BMI) 31.0-31.9, adult: Secondary | ICD-10-CM | POA: Diagnosis not present

## 2021-01-02 DIAGNOSIS — I129 Hypertensive chronic kidney disease with stage 1 through stage 4 chronic kidney disease, or unspecified chronic kidney disease: Secondary | ICD-10-CM | POA: Insufficient documentation

## 2021-01-02 DIAGNOSIS — Z7901 Long term (current) use of anticoagulants: Secondary | ICD-10-CM | POA: Diagnosis not present

## 2021-01-02 DIAGNOSIS — N189 Chronic kidney disease, unspecified: Secondary | ICD-10-CM | POA: Insufficient documentation

## 2021-01-02 DIAGNOSIS — Z79899 Other long term (current) drug therapy: Secondary | ICD-10-CM | POA: Diagnosis not present

## 2021-01-02 DIAGNOSIS — Z8673 Personal history of transient ischemic attack (TIA), and cerebral infarction without residual deficits: Secondary | ICD-10-CM | POA: Insufficient documentation

## 2021-01-02 DIAGNOSIS — I4819 Other persistent atrial fibrillation: Secondary | ICD-10-CM

## 2021-01-02 DIAGNOSIS — Z7984 Long term (current) use of oral hypoglycemic drugs: Secondary | ICD-10-CM | POA: Insufficient documentation

## 2021-01-02 DIAGNOSIS — D6869 Other thrombophilia: Secondary | ICD-10-CM

## 2021-01-02 DIAGNOSIS — E669 Obesity, unspecified: Secondary | ICD-10-CM | POA: Insufficient documentation

## 2021-01-02 NOTE — Progress Notes (Signed)
Primary Care Physician: [name], MD Primary Cardiologist: Dr Radford Pax Primary Electrophysiologist: none Referring Physician: Dr Horton [name] is a 80 y.o. male with a history of colon cancer s/p sigmoid colectomy 2018, CKD, CVA, HTN, atrial fibrillation who presents for consultation in the [facility].  The patient was initially diagnosed with atrial fibrillation 07/2020 in the setting of acute CVA. Patient is on Eliquis for a CHADS2VASC score of 5. Patient was admitted 12/23/20 with a mechanical fall. patient was carrying a heavy load and walking down the back porch when he fell.  Wife states that he did not lose consciousness and was able to get up with help from his son.  Patient came back inside and rested on a chair.  30 minutes later wife came back and found him difficult to arouse and called EMS.  Patient states he does not remember the event but also states that he did not lose consciousness. Echo showed mildly reduced EF 45-50% with severe akineses of apical anteroseptal wall and apical segment.   On follow up today, patient reports that he "feels great." His is unaware of his arrhythmia. He denies any bleeding issues on anticoagulation.   Today, he denies symptoms of palpitations, chest pain, shortness of breath, orthopnea, PND, lower extremity edema, dizziness, presyncope, syncope, snoring, daytime somnolence, bleeding.The patient is tolerating medications without difficulties and is otherwise without complaint today.    Atrial Fibrillation Risk Factors:  he does not have symptoms or diagnosis of sleep apnea. he does not have a history of rheumatic fever. he does not have a history of alcohol use.   he has a BMI of Body mass index is 31.41 kg/m.Marland Kitchen Filed Weights   01/02/21 1325  Weight: 93.7 kg    Family History  Problem Relation Age of Onset   Cancer Mother        LUNG   COPD Father      Atrial Fibrillation  Management history:  Previous antiarrhythmic drugs: none Previous cardioversions: none Previous ablations: none CHADS2VASC score: 5 Anticoagulation history: Eliquis   Past Medical History:  Diagnosis Date   Anxiety    Arthritis    Cataract    Colon cancer ([place]) dx'd 11/2016   "stage 3"   Gout    Gout    Hemorrhoids    History of kidney stones    "passed it"   Hypertension    Pre-diabetes    Past Surgical History:  Procedure Laterality Date   COLECTOMY  12/17/2016   lap; partial sigmoid colectomy/notes 12/17/2016   COLONOSCOPY W/ BIOPSIES AND POLYPECTOMY  11/11/2016   LAPAROSCOPIC SIGMOID COLECTOMY [name] 12/17/2016   Procedure: LAPAROSCOPIC SIGMOID COLECTOMY;  Surgeon: [name], MD;  Location: [facility];  Service: General;  Laterality: [name];   NASAL SEPTUM SURGERY     TONSILLECTOMY      Current Outpatient Medications  Medication Sig Dispense Refill   amantadine (SYMMETREL) 100 MG capsule Take 2 capsules (200 mg total) by mouth daily. For aphasia 60 capsule 5   apixaban (ELIQUIS) 2.5 MG TABS tablet Take 1 tablet (2.5 mg total) by mouth 2 (two) times daily. 180 tablet 3   colestipol (COLESTID) 1 g tablet Take 1 tablet (1 g total) by mouth 2 (two) times daily. 30 tablet 0   dapagliflozin propanediol (FARXIGA) 10 MG TABS tablet Take 1 tablet (10 mg total) by mouth daily. 30 tablet 0   LORazepam (ATIVAN) 1 MG tablet Take 1 tablet (  1 mg total) by mouth daily as needed for anxiety. 30 tablet    rosuvastatin (CRESTOR) 40 MG tablet Take 1 tablet (40 mg total) by mouth daily. 30 tablet 0   senna-docusate (SENOKOT-S) 8.6-50 MG tablet Take 1 tablet by mouth 2 (two) times daily. 60 tablet 0   No current facility-administered medications for this encounter.    No Known Allergies  Social History   Socioeconomic History   Marital status: Married    Spouse name: Not on file   Number of children: Not on file   Years of education: Not on file   Highest education level: Not on file   Occupational History   Not on file  Tobacco Use   Smoking status: Never   Smokeless tobacco: Never  Vaping Use   Vaping Use: Never used  Substance and Sexual Activity   Alcohol use: No   Drug use: No   Sexual activity: Never  Other Topics Concern   Not on file  Social History Narrative   Not on file   Social Determinants of Health   Financial Resource Strain: Not on file  Food Insecurity: Not on file  Transportation Needs: Not on file  Physical Activity: Not on file  Stress: Not on file  Social Connections: Not on file  Intimate Partner Violence: Not on file     ROS- All systems are reviewed and negative except as per the HPI above.  Physical Exam: Vitals:   01/02/21 1325  BP: (!) 144/92  Pulse: 90  Weight: 93.7 kg  Height: 5\' 8"  (1.727 m)    GEN- The patient is a well appearing elderly obese male, alert and oriented x 3 today.   Head- normocephalic, atraumatic Eyes-  Sclera clear, conjunctiva pink Ears- hearing intact Oropharynx- clear Neck- supple  Lungs- Clear to ausculation bilaterally, normal work of breathing Heart- irregular rate and rhythm, no murmurs, rubs or gallops  GI- soft, NT, ND, + BS Extremities- no clubbing, cyanosis, or edema MS- no significant deformity or atrophy Skin- no rash or lesion Psych- euthymic mood, full affect Neuro- strength and sensation are intact  Wt Readings from Last 3 Encounters:  01/02/21 93.7 kg  12/23/20 95.3 kg  12/18/20 94.3 kg    EKG today demonstrates  Afib, RBBB, LAFB Vent. rate 90 BPM PR interval * ms QRS duration 130 ms QT/QTcB 392/479 ms  Echo 12/24/20 demonstrated   1. Left ventricular ejection fraction, by estimation, is 45 to 50%. The  left ventricle has mildly decreased function. The left ventricle  demonstrates regional wall motion abnormalities (see scoring  diagram/findings for description). The left ventricular   internal cavity size was mildly dilated. Left ventricular diastolic   function could not be evaluated. There is severe akinesis of the left  ventricular, apical anteroseptal wall and apical segment.   2. Right ventricular systolic function is normal. The right ventricular  size is normal.   3. Left atrial size was moderately dilated.   4. Right atrial size was mildly dilated.   5. The mitral valve is grossly normal. Mild mitral valve regurgitation.   6. The aortic valve is normal in structure. Aortic valve regurgitation is  not visualized. No aortic stenosis is present.   Epic records are reviewed at length today  CHA2DS2-VASc Score = 5  The patient's score is based upon: CHF History: 0 HTN History: 1 Diabetes History: 0 Stroke History: 2 Vascular Disease History: 0 Age Score: 2 Gender Score: 0  ASSESSMENT AND PLAN: 1. Persistent Atrial Fibrillation (ICD10:  I48.19) The patient's CHA2DS2-VASc score is 5, indicating a 7.2% annual risk of stroke.   Patient in rate controlled afib. We discussed rate vs rhythm control. Can consider DCCV for trial of SR, may need ischemic evaluation first. Ultimately, given his age and paucity of symptoms, could consider rate control.  Continue Eliquis 2.5 mg BID  2. Secondary Hypercoagulable State (ICD10:  D68.69) The patient is at significant risk for stroke/thromboembolism based upon his CHA2DS2-VASc Score of 5.  Continue Apixaban (Eliquis).   3. Obesity Body mass index is 31.41 kg/m. Lifestyle modification was discussed at length including regular exercise and weight reduction.  4. HTN Stable, no changes today.  5. Mildly reduced EF EF 45-50% with severe akinesis of apical anteroseptal wall and apical segment, not noted on echo earlier this year.  No anginal symptoms today, will have him follow up with Dr Radford Pax at next available appointment for ischemic workup. Will hold on DCCV for now in case LHC is needed.    Follow up with Dr Radford Pax at next available appointment.    [facility] [address] [place], [place] [address] [phone] 01/02/2021 1:34 PM

## 2021-01-11 ENCOUNTER — Encounter: Payer: Self-pay | Admitting: Cardiology

## 2021-01-11 ENCOUNTER — Ambulatory Visit (INDEPENDENT_AMBULATORY_CARE_PROVIDER_SITE_OTHER): Payer: Medicare Other | Admitting: Cardiology

## 2021-01-11 ENCOUNTER — Other Ambulatory Visit: Payer: Self-pay

## 2021-01-11 VITALS — BP 132/78 | HR 99 | Ht 68.0 in | Wt 202.6 lb

## 2021-01-11 DIAGNOSIS — I639 Cerebral infarction, unspecified: Secondary | ICD-10-CM

## 2021-01-11 DIAGNOSIS — I10 Essential (primary) hypertension: Secondary | ICD-10-CM

## 2021-01-11 DIAGNOSIS — I4819 Other persistent atrial fibrillation: Secondary | ICD-10-CM | POA: Diagnosis not present

## 2021-01-11 DIAGNOSIS — I420 Dilated cardiomyopathy: Secondary | ICD-10-CM

## 2021-01-11 DIAGNOSIS — R600 Localized edema: Secondary | ICD-10-CM

## 2021-01-11 DIAGNOSIS — I63512 Cerebral infarction due to unspecified occlusion or stenosis of left middle cerebral artery: Secondary | ICD-10-CM

## 2021-01-11 DIAGNOSIS — R350 Frequency of micturition: Secondary | ICD-10-CM

## 2021-01-11 NOTE — Addendum Note (Signed)
Addended by: Antonieta Iba on: 01/11/2021 11:10 AM   Modules accepted: Orders

## 2021-01-11 NOTE — Progress Notes (Signed)
Cardiology Note    Date:  01/11/2021   ID:  MINOR IDEN, DOB [birth year], MRN [id number]  PCP:  [name], MD  Cardiologist:  Fransico Him, MD   Chief Complaint  Patient presents with   Atrial Fibrillation   Hypertension   Cardiomyopathy    History of Present Illness:  Edwin Wall is a 80 y.o. male with a history of stage III colon cancer, gout, hypertension, prediabetes and PAF.  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  He was started on apixaban 5 mg twice daily for a CHA2DS2-VASc 2 score of now 5.  2D echocardiogram was done 07/16/2020 showing low normal LV function with EF 50 to 55%, mild left atrial enlargement, mild MR. Repeat 2D echocardiogram 12/24/2020 showed EF 45 to 50% with mild LV enlargement and severe akinesis of the apical anterior septal wall and apical segment.  Left atrium was moderately dilated and right atrium mildly dilated.  There was mild MR.  He was referred to A. fib clinic was seen 01/02/2021 and was feeling great with no complaints of palpitations.  In atrial fibrillation at the time and cardioversion was recommended but felt he needed ischemic evaluation for LV dysfunction.  He is here today for followup and is doing well.  He denies any chest pain or pressure, SOB, DOE, PND, orthopnea, dizziness, palpitations or syncope. He has chronic LE edema which is stable.  He is compliant with his meds and is tolerating meds with no SE.     Past Medical History:  Diagnosis Date   Anxiety    Arthritis    Cataract    Colon cancer ([place]) dx'd 11/2016   "stage 3"   Gout    Gout    Hemorrhoids    History of kidney stones    "passed it"   Hypertension    Pre-diabetes     Past Surgical History:  Procedure Laterality Date   COLECTOMY  12/17/2016   lap; partial sigmoid colectomy/notes 12/17/2016   COLONOSCOPY W/ BIOPSIES AND POLYPECTOMY  11/11/2016   LAPAROSCOPIC  SIGMOID COLECTOMY [name] 12/17/2016   Procedure: LAPAROSCOPIC SIGMOID COLECTOMY;  Surgeon: [name], MD;  Location: [facility];  Service: General;  Laterality: [name];   NASAL SEPTUM SURGERY     TONSILLECTOMY      Current Medications: Current Meds  Medication Sig   amantadine (SYMMETREL) 100 MG capsule Take 2 capsules (200 mg total) by mouth daily. For aphasia   apixaban (ELIQUIS) 2.5 MG TABS tablet Take 1 tablet (2.5 mg total) by mouth 2 (two) times daily.   colestipol (COLESTID) 1 g tablet Take 1 tablet (1 g total) by mouth 2 (two) times daily.   LORazepam (ATIVAN) 1 MG tablet Take 1 tablet (1 mg total) by mouth daily as needed for anxiety.   rosuvastatin (CRESTOR) 40 MG tablet Take 1 tablet (40 mg total) by mouth daily.   senna-docusate (SENOKOT-S) 8.6-50 MG tablet Take 1 tablet by mouth 2 (two) times daily.    Allergies:   Patient has no known allergies.   Social History   Socioeconomic History   Marital status: Married    Spouse name: Not on file   Number of children: Not on file   Years of education: Not on file   Highest education level: Not on file  Occupational History   Not on file  Tobacco Use   Smoking status: Never  Smokeless tobacco: Never  Vaping Use   Vaping Use: Never used  Substance and Sexual Activity   Alcohol use: No   Drug use: No   Sexual activity: Never  Other Topics Concern   Not on file  Social History Narrative   Not on file   Social Determinants of Health   Financial Resource Strain: Not on file  Food Insecurity: Not on file  Transportation Needs: Not on file  Physical Activity: Not on file  Stress: Not on file  Social Connections: Not on file     Family History:  The patient's family history includes COPD in his father; Cancer in his mother.   ROS:   Please see the history of present illness.    ROS All other systems reviewed and are negative.  No flowsheet data found.     PHYSICAL EXAM:   VS:  BP 132/78   Pulse 99   Ht 5\' 8"   (1.727 m)   Wt 202 lb 9.6 oz (91.9 kg)   SpO2 97%   BMI 30.81 kg/m    GEN: Well nourished, well developed in no acute distress HEENT: Normal NECK: No JVD; No carotid bruits LYMPHATICS: No lymphadenopathy CARDIAC:irregularly irregular, no murmurs, rubs, gallops RESPIRATORY:  Clear to auscultation without rales, wheezing or rhonchi  ABDOMEN: Soft, non-tender, non-distended MUSCULOSKELETAL:  1+ LE edema; No deformity  SKIN: Warm and dry NEUROLOGIC:  Alert and oriented x 3 PSYCHIATRIC:  Normal affect   Wt Readings from Last 3 Encounters:  01/11/21 202 lb 9.6 oz (91.9 kg)  01/02/21 206 lb 9.6 oz (93.7 kg)  12/23/20 210 lb (95.3 kg)      Studies/Labs Reviewed:   EKG:  EKG is not ordered today.  Recent Labs: 07/16/2020: TSH 0.966 11/24/2020: ALT 15 12/24/2020: BUN 11; Creatinine, Ser 1.39; Hemoglobin 13.0; Magnesium 1.9; Platelets 159; Potassium 3.3; Sodium 143   Lipid Panel    Component Value Date/Time   CHOL 179 07/16/2020 0337   TRIG 92 07/16/2020 0337   HDL 34 (L) 07/16/2020 0337   CHOLHDL 5.3 07/16/2020 0337   VLDL 18 07/16/2020 0337   LDLCALC 127 (H) 07/16/2020 0337     Additional studies/ records that were reviewed today include:  OV notes from PCP    ASSESSMENT:    1. Persistent atrial fibrillation ([place])   2. Primary hypertension   3. Acute CVA (cerebrovascular accident) ([place])   4. Leg edema   5. DCM (dilated cardiomyopathy) ([place])      PLAN:  In order of problems listed above:  Persistent atrial fibrillation -He has been followed in atrial fibrillation clinic -He remains in atrial fibrillation today on exam with heart rate controlled.  He has no cardiac awareness of his arrhythmia -He denies any bleeding problems on DOAC -Continue prescription drug management with Eliquis 2.5 mg daily twice daily (dosed for age > 80 and SCr consistently > 1.5) with as needed refills for CHA2DS2-VASc score of 5 -2D echo showed moderate left atrial enlargement -TSH was  normal in May 2022 -Plan for DCCV  once ischemic work-up has been completed  2  Hypertension -BP is adequately controlled on exam today -He has not required any blood pressure medicine  3.  History of L MCA -occurred in setting of new onset atrial fibrillation -now on Eliquis -per Neuro  4.  LE edema -likely exacerbated by atrial fibrillation -This is been stable with compression hose but still has some on exam -Diuretics were stopped due to frequent urination at  night>>refer to Urology for evaluation of urinary incontinence  5.  Dilated cardiomyopathy -May be related to underlying atrial fibrillation although heart rate is well controlled but he also has focal wall motion abnormalities on echo -He does have cardiac risk factors for CAD including advanced age, hypertension prediabetes -I will get a Lexiscan Myoview to rule out ischemia -Shared Decision Making/Informed Consent The risks [chest pain, shortness of breath, cardiac arrhythmias, dizziness, blood pressure fluctuations, myocardial infarction, stroke/transient ischemic attack, nausea, vomiting, allergic reaction, radiation exposure, metallic taste sensation and life-threatening complications (estimated to be 1 in 10,000)], benefits (risk stratification, diagnosing coronary artery disease, treatment guidance) and alternatives of a nuclear stress test were discussed in detail with Edwin Wall and he agrees to proceed.   Followup with me in 3 months  Time Spent: 25 minutes total time of encounter, including 15 minutes spent in face-to-face patient care on the date of this encounter. This time includes coordination of care and counseling regarding above mentioned problem list. Remainder of non-face-to-face time involved reviewing chart documents/testing relevant to the patient encounter and documentation in the medical record. I have independently reviewed documentation from referring provider  Medication Adjustments/Labs and  Tests Ordered: Current medicines are reviewed at length with the patient today.  Concerns regarding medicines are outlined above.  Medication changes, Labs and Tests ordered today are listed in the Patient Instructions below.  There are no Patient Instructions on file for this visit.   Signed, Fransico Him, MD  01/11/2021 10:48 AM    [facility] [place], [place], [place]  [address] Phone: [phone]; Fax: [phone]

## 2021-01-11 NOTE — Patient Instructions (Signed)
Medication Instructions:  Your physician recommends that you continue on your current medications as directed. Please refer to the Current Medication list given to you today.  *If you need a refill on your cardiac medications before your next appointment, please call your pharmacy*  Testing/Procedures: Your physician has requested that you have a lexiscan myoview. For further information please visit [email or web address]. Please follow instruction sheet, as given.  Follow-Up: At [facility], you and your health needs are our priority.  As part of our continuing mission to provide you with exceptional heart care, we have created designated Provider Care Teams.  These Care Teams include your primary Cardiologist (physician) and Advanced Practice Providers (APPs -  Physician Assistants and Nurse Practitioners) who all work together to provide you with the care you need, when you need it.  Your next appointment:   03/26/21 at 1:40 pm  The format for your next appointment:   In Person  Provider:   Fransico Him, MD     Other Instructions Dr. Radford Pax would like for you to follow back up in the A Fib clinic after your stress test.   You have been referred to see a urologist.

## 2021-01-11 NOTE — Addendum Note (Signed)
Addended by: Fransico Him R on: 01/11/2021 03:51 PM   Modules accepted: Orders

## 2021-01-11 NOTE — Addendum Note (Signed)
Addended by: Antonieta Iba on: 01/11/2021 11:33 AM   Modules accepted: Orders

## 2021-01-15 ENCOUNTER — Telehealth (HOSPITAL_COMMUNITY): Payer: Self-pay

## 2021-01-15 NOTE — Telephone Encounter (Signed)
Spoke with the patient's wife, per DPR. She stated that she would be here for his test. Asked to call back with any questions. S.Cayla Wiegand EMTP

## 2021-01-16 ENCOUNTER — Observation Stay (HOSPITAL_COMMUNITY)
Admission: EM | Admit: 2021-01-16 | Discharge: 2021-01-18 | Disposition: A | Discharge status: Home / Self Care | Payer: Medicare Other | Attending: Internal Medicine | Admitting: Internal Medicine

## 2021-01-16 ENCOUNTER — Emergency Department (HOSPITAL_COMMUNITY): Payer: Medicare Other

## 2021-01-16 ENCOUNTER — Other Ambulatory Visit: Payer: Self-pay

## 2021-01-16 ENCOUNTER — Encounter (HOSPITAL_COMMUNITY): Payer: Self-pay

## 2021-01-16 DIAGNOSIS — F0390 Unspecified dementia without behavioral disturbance: Secondary | ICD-10-CM | POA: Diagnosis not present

## 2021-01-16 DIAGNOSIS — E119 Type 2 diabetes mellitus without complications: Secondary | ICD-10-CM | POA: Diagnosis not present

## 2021-01-16 DIAGNOSIS — Z85038 Personal history of other malignant neoplasm of large intestine: Secondary | ICD-10-CM | POA: Insufficient documentation

## 2021-01-16 DIAGNOSIS — I110 Hypertensive heart disease with heart failure: Secondary | ICD-10-CM | POA: Diagnosis not present

## 2021-01-16 DIAGNOSIS — R55 Syncope and collapse: Principal | ICD-10-CM | POA: Insufficient documentation

## 2021-01-16 DIAGNOSIS — R2689 Other abnormalities of gait and mobility: Secondary | ICD-10-CM | POA: Insufficient documentation

## 2021-01-16 DIAGNOSIS — I4891 Unspecified atrial fibrillation: Secondary | ICD-10-CM | POA: Diagnosis not present

## 2021-01-16 DIAGNOSIS — Z8673 Personal history of transient ischemic attack (TIA), and cerebral infarction without residual deficits: Secondary | ICD-10-CM | POA: Insufficient documentation

## 2021-01-16 DIAGNOSIS — Z20822 Contact with and (suspected) exposure to covid-19: Secondary | ICD-10-CM | POA: Diagnosis not present

## 2021-01-16 DIAGNOSIS — Z7901 Long term (current) use of anticoagulants: Secondary | ICD-10-CM | POA: Diagnosis not present

## 2021-01-16 DIAGNOSIS — I5020 Unspecified systolic (congestive) heart failure: Secondary | ICD-10-CM | POA: Insufficient documentation

## 2021-01-16 DIAGNOSIS — E43 Unspecified severe protein-calorie malnutrition: Secondary | ICD-10-CM | POA: Insufficient documentation

## 2021-01-16 DIAGNOSIS — R4020 Unspecified coma: Secondary | ICD-10-CM

## 2021-01-16 LAB — COMPREHENSIVE METABOLIC PANEL
ALT: 17 U/L (ref 0–44)
AST: 26 U/L (ref 15–41)
Albumin: 2.7 g/dL — ABNORMAL LOW (ref 3.5–5.0)
Alkaline Phosphatase: 90 U/L (ref 38–126)
Anion gap: 14 (ref 5–15)
BUN: 16 mg/dL (ref 8–23)
CO2: 19 mmol/L — ABNORMAL LOW (ref 22–32)
Calcium: 9.1 mg/dL (ref 8.9–10.3)
Chloride: 104 mmol/L (ref 98–111)
Creatinine, Ser: 1.67 mg/dL — ABNORMAL HIGH (ref 0.61–1.24)
GFR, Estimated: 41 mL/min — ABNORMAL LOW (ref >60)
Glucose, Bld: 177 mg/dL — ABNORMAL HIGH (ref 70–99)
Potassium: 3.7 mmol/L (ref 3.5–5.1)
Sodium: 137 mmol/L (ref 135–145)
Total Bilirubin: 1.7 mg/dL — ABNORMAL HIGH (ref 0.3–1.2)
Total Protein: 5.7 g/dL — ABNORMAL LOW (ref 6.5–8.1)

## 2021-01-16 LAB — CBC WITH DIFFERENTIAL/PLATELET
Abs Immature Granulocytes: 0.07 10*3/uL (ref 0.00–0.07)
Basophils Absolute: 0 10*3/uL (ref 0.0–0.1)
Basophils Relative: 0 %
Eosinophils Absolute: 0 10*3/uL (ref 0.0–0.5)
Eosinophils Relative: 0 %
HCT: 45.5 % (ref 39.0–52.0)
Hemoglobin: 14.7 g/dL (ref 13.0–17.0)
Immature Granulocytes: 1 %
Lymphocytes Relative: 3 %
Lymphs Abs: 0.4 10*3/uL — ABNORMAL LOW (ref 0.7–4.0)
MCH: 30.9 pg (ref 26.0–34.0)
MCHC: 32.3 g/dL (ref 30.0–36.0)
MCV: 95.6 fL (ref 80.0–100.0)
Monocytes Absolute: 0.9 10*3/uL (ref 0.1–1.0)
Monocytes Relative: 7 %
Neutro Abs: 12.2 10*3/uL — ABNORMAL HIGH (ref 1.7–7.7)
Neutrophils Relative %: 89 %
Platelets: 197 10*3/uL (ref 150–400)
RBC: 4.76 MIL/uL (ref 4.22–5.81)
RDW: 13.4 % (ref 11.5–15.5)
WBC: 13.7 10*3/uL — ABNORMAL HIGH (ref 4.0–10.5)
nRBC: 0 % (ref 0.0–0.2)

## 2021-01-16 LAB — MAGNESIUM: Magnesium: 1.9 mg/dL (ref 1.7–2.4)

## 2021-01-16 LAB — CBG MONITORING, ED: Glucose-Capillary: 184 mg/dL — ABNORMAL HIGH (ref 70–99)

## 2021-01-16 LAB — TROPONIN I (HIGH SENSITIVITY): Troponin I (High Sensitivity): 17 ng/L (ref <18)

## 2021-01-16 LAB — BRAIN NATRIURETIC PEPTIDE: B Natriuretic Peptide: 235.2 pg/mL — ABNORMAL HIGH (ref 0.0–100.0)

## 2021-01-16 IMAGING — DX DG CHEST 1V PORT
1 series · 1 of 1 positions shown · non-contrast
Comparison: [DATE]

CLINICAL DATA: Syncope.

EXAM:
PORTABLE CHEST 1 VIEW

[chest ap]
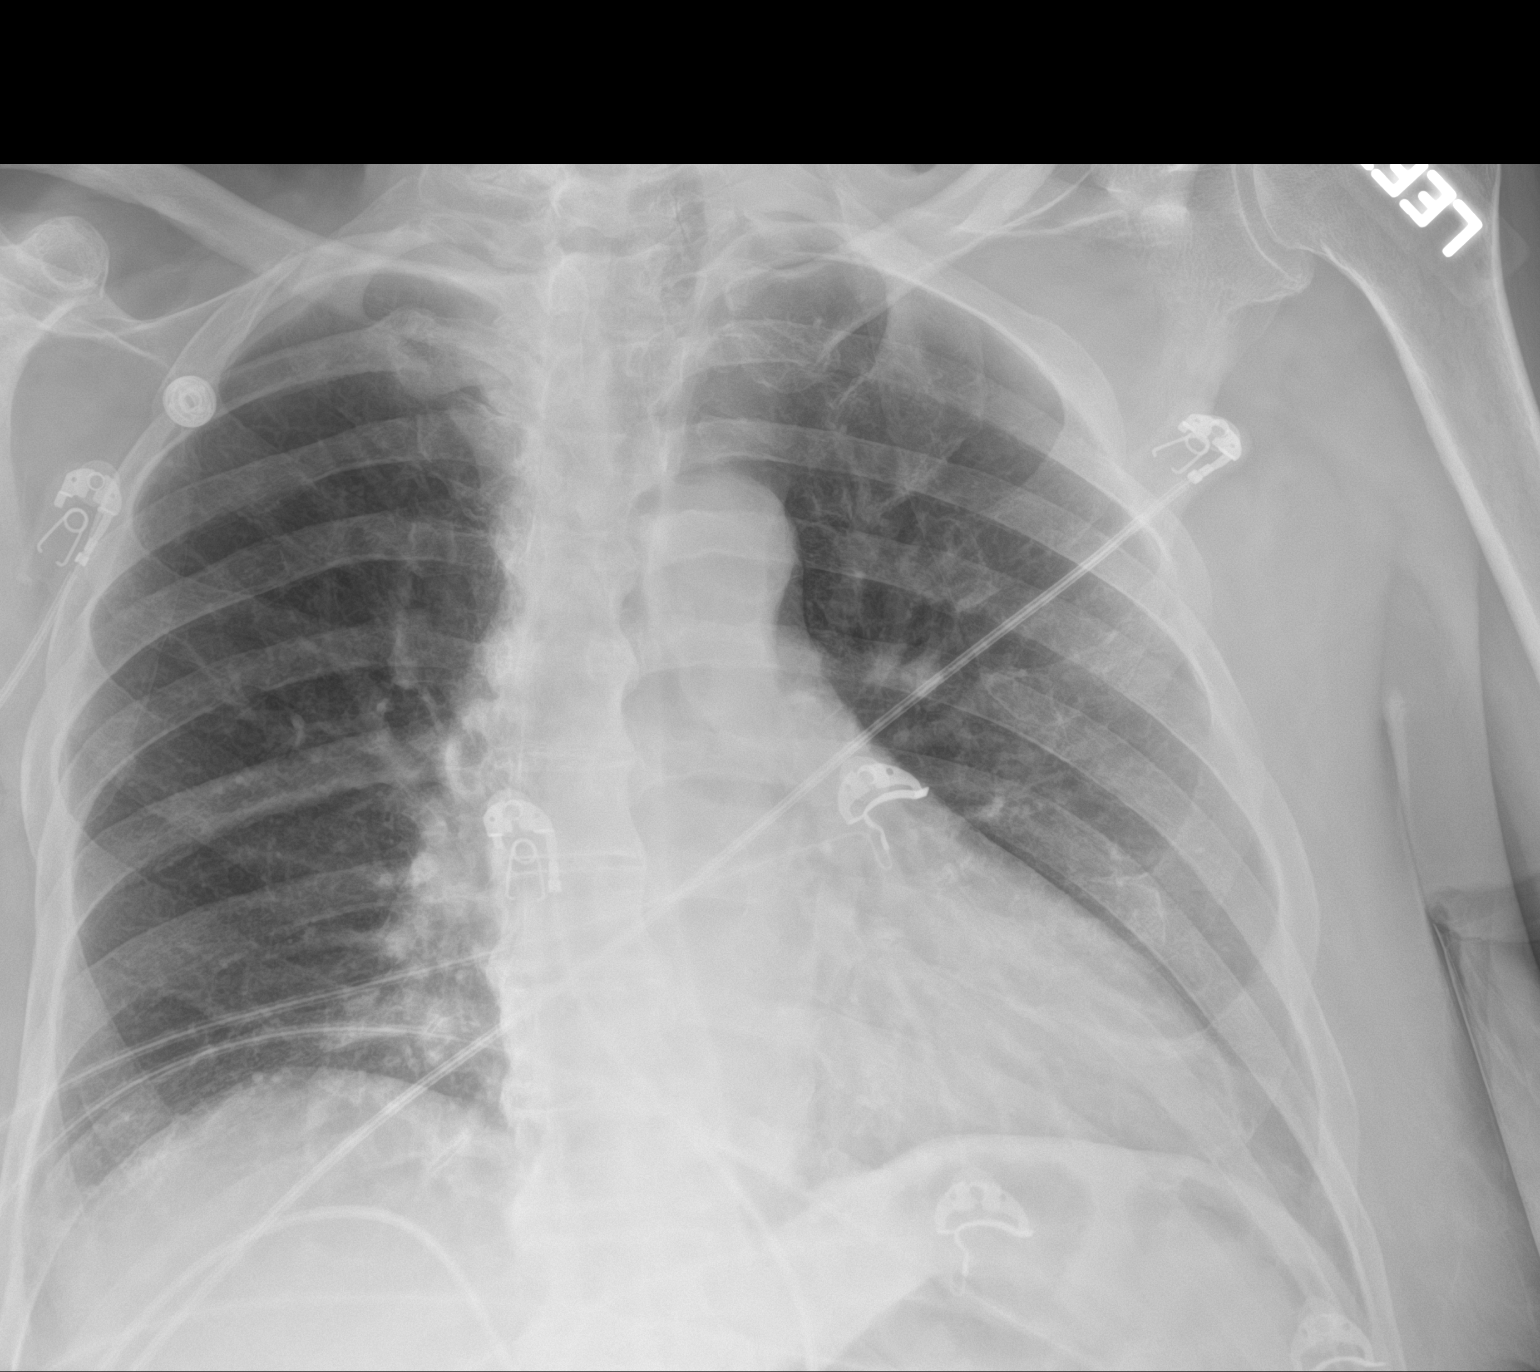

[1 of 1 positions shown; findings below may reference images not displayed]

FINDINGS: There is no evidence of acute infiltrate, pleural effusion or
pneumothorax. Mild patient rotation is noted. The heart size and
mediastinal contours are within normal limits. Degenerative changes
are seen within the thoracic spine.
IMPRESSION: No active disease.

## 2021-01-16 MED ORDER — METOPROLOL TARTRATE 5 MG/5ML IV SOLN
5.0000 mg | Freq: Once | INTRAVENOUS | Status: DC
  Filled 2021-01-16: qty 5

## 2021-01-16 NOTE — ED Triage Notes (Signed)
Pt presents to the ED from home via [facility] with complaints of syncopal episode. Pt was in the kitchen per wife and heard pt scream and then a loud bang, when she went in he was laying on the floor and was not responding to her. Pt was alert but unable to answer questions when EMS arrived, improved during transport and pt is a/o to person, place, time, denies remembering what happened. Pt also in AFIB on monitor, EMS gave NS 58ml bolus without improvement.

## 2021-01-16 NOTE — ED Provider Notes (Signed)
Emergency Department Provider Note   I have reviewed the triage vital signs and the nursing notes.   HISTORY  Chief Complaint Loss of Consciousness   HPI Edwin Wall is a 80 y.o. male with PMH reviewed below including prior CVA, HTN, recent admit for syncope, A-fib, and new cardiomyopathy found on recent ECHO presents to the ED with a syncope episode.  Patient does not recall the particulars surrounding the event.  According to EMS, he was at home and his wife heard a loud thud in the kitchen.  They arrived to find him minimally responsive.  He did not require CPR but they called 911.  EMS reported that on scene he was alert but not answering questions and that seem to gradually improve during transport.  They found him to be in A. fib and gave 500 mL fluid bolus.  No hypotension or fever in route.  Blood sugar mildly elevated per EMS.  Patient denies any active pain in his chest, head, abdomen, back, neck.  While he does not recall the particulars surrounding the event today states he is overall feeling well.    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   AKI (acute kidney injury) ([place])    Prediabetes    New onset atrial fibrillation ([place])    Dysphagia    Hyperlipidemia 07/16/2020   Type II diabetes mellitus ([place]) 07/16/2020   Atrial fibrillation ([place]) 07/16/2020   Acute CVA (cerebrovascular accident) ([place]) 07/15/2020    Cancer of sigmoid colon ([place]) 12/17/2016   Obesity (BMI 30-39.9) 01/15/2011   Hypertension 01/15/2011   History of gout 01/15/2011   ED (erectile dysfunction) 01/15/2011    Past Surgical History:  Procedure Laterality Date   COLECTOMY  12/17/2016   lap; partial sigmoid colectomy/notes 12/17/2016   COLONOSCOPY W/ BIOPSIES AND POLYPECTOMY  11/11/2016   LAPAROSCOPIC SIGMOID COLECTOMY [name] 12/17/2016   Procedure: LAPAROSCOPIC SIGMOID COLECTOMY;  Surgeon: [name], MD;  Location: [facility];  Service: General;  Laterality: [name];   NASAL SEPTUM SURGERY     TONSILLECTOMY      Allergies Patient has no known allergies.  Family History  Problem Relation Age of Onset   Cancer Mother        LUNG   COPD Father     Social History Social History   Tobacco Use   Smoking status: Never   Smokeless tobacco: Never  Vaping Use   Vaping Use: Never used  Substance Use Topics   Alcohol use: No   Drug use: No    Review of Systems  Constitutional: No fever/chills Eyes: No visual changes. ENT: No sore throat. Cardiovascular: Denies chest pain. Positive syncope.  Respiratory: Denies shortness of breath. Gastrointestinal: No abdominal pain.  No nausea, no vomiting.  No diarrhea.  No constipation. Genitourinary: Negative for dysuria. Musculoskeletal: Negative for back pain. Skin: Negative for rash. Neurological: Negative for headaches, focal weakness or numbness.  10-point ROS otherwise negative.  ____________________________________________   PHYSICAL EXAM:  VITAL SIGNS: ED Triage Vitals  Enc Vitals Group     BP 01/16/21 2151 (!) 131/93     Pulse Rate 01/16/21 2151 79     Resp 01/16/21 2151 (!) 23     Temp 01/16/21 2151 98.1 F (36.7 C)     Temp Source 01/16/21 2151 Oral     SpO2 01/16/21 2149 96 %     Weight 01/16/21 2159 202 lb (91.6 kg)     Height 01/16/21 2159 5\' 8"  (1.727 m)   Constitutional: Alert but unable to recall the events surrounding his fall/syncope. Well  appearing and in no acute distress. Eyes: Conjunctivae are normal.  Head: Atraumatic. Nose: No congestion/rhinnorhea. Mouth/Throat: Mucous membranes are moist.  Oropharynx non-erythematous. Neck: No stridor.   Cardiovascular: A fib. Good peripheral circulation. Grossly normal heart sounds.   Respiratory: Normal respiratory effort.  No retractions. Lungs CTAB. Gastrointestinal: Soft and nontender. No distention.  Musculoskeletal: No lower extremity tenderness nor edema. No gross deformities of extremities. Neurologic:  Normal speech and language. No gross focal neurologic deficits are appreciated.  Skin:  Skin is warm, dry and intact. No rash noted.  ____________________________________________   LABS (all labs ordered are listed, but only abnormal results are displayed)  Labs Reviewed  COMPREHENSIVE METABOLIC PANEL - Abnormal; Notable for the following components:      Result Value   CO2 19 (*)    Glucose, Bld 177 (*)    Creatinine, Ser 1.67 (*)    Total Protein 5.7 (*)    Albumin 2.7 (*)    Total Bilirubin 1.7 (*)    GFR, Estimated 41 (*)    All other components within normal limits  CBC WITH DIFFERENTIAL/PLATELET - Abnormal; Notable for the following components:   WBC 13.7 (*)    Neutro Abs 12.2 (*)    Lymphs Abs 0.4 (*)    All other components within normal limits  BRAIN NATRIURETIC PEPTIDE - Abnormal; Notable for the following components:   B Natriuretic Peptide 235.2 (*)    All other components within normal limits  URINALYSIS, ROUTINE W REFLEX MICROSCOPIC - Abnormal; Notable for the following components:   Glucose, UA >=500 (*)    Hgb urine dipstick SMALL (*)    Protein, ur 30 (*)    All other components within normal limits  BASIC METABOLIC PANEL - Abnormal; Notable for the following components:   Glucose, Bld 140 (*)    Creatinine, Ser 1.46 (*)    GFR, Estimated 48 (*)    All other components within normal limits  CBG MONITORING, ED - Abnormal; Notable for the  following components:   Glucose-Capillary 184 (*)    All other components within normal limits  TROPONIN I (HIGH SENSITIVITY) - Abnormal; Notable for the following components:   Troponin I (High Sensitivity) 21 (*)    All other components within normal limits  RESP PANEL BY RT-PCR (FLU A&B, COVID) ARPGX2  MAGNESIUM  TSH  MAGNESIUM  PHOSPHORUS  BETA-HYDROXYBUTYRIC ACID  CEA  TROPONIN I (HIGH SENSITIVITY)   ____________________________________________  EKG   EKG Interpretation  Date/Time:  Wednesday January 16 2021 21:50:17 EST Ventricular Rate:  107 PR Interval:    QRS Duration: 140 QT Interval:  369 QTC Calculation: 493 R Axis:   -80 Text Interpretation: Atrial fibrillation  RBBB and LAFB Confirmed by Nanda Quinton [phone]) on 01/16/2021 9:54:31 PM        ____________________________________________  RADIOLOGY  CT Head Wo Contrast  Result Date: 01/16/2021 CLINICAL DATA:  Syncope. EXAM: CT HEAD WITHOUT CONTRAST TECHNIQUE: Contiguous axial images were obtained from the base of the skull through the vertex without intravenous contrast. COMPARISON:  None. FINDINGS: Brain: There is mild cerebral atrophy with widening of the extra-axial spaces and ventricular dilatation. There are areas of decreased attenuation within the white matter tracts of the supratentorial brain, consistent with microvascular disease changes. A stable area of cortical encephalomalacia, with adjacent chronic white matter low attenuation, is seen within the left frontal parietal region. Small chronic bilateral basal ganglia lacunar infarcts are also present. Vascular: No hyperdense vessel or unexpected calcification. Skull: Normal. Negative for fracture or focal lesion. Sinuses/Orbits: There is mild to moderate severity sphenoid sinus mucosal thickening. Other: None. IMPRESSION: 1. Generalized cerebral atrophy. 2. Chronic left frontal parietal lobe infarct. 3. Small chronic bilateral basal ganglia lacunar  infarcts. 4. No acute intracranial abnormality. Electronically Signed   By: Virgina Norfolk M.D.   On: 01/16/2021 23:07   DG Chest Portable 1 View  Result Date: 01/16/2021 CLINICAL DATA:  Syncope. EXAM: PORTABLE CHEST 1 VIEW COMPARISON:  December 23, 2020 FINDINGS: There is no evidence of acute infiltrate, pleural effusion or pneumothorax. Mild patient rotation is noted. The heart size and mediastinal contours are within normal limits. Degenerative changes are seen within the thoracic spine. IMPRESSION: No active disease. Electronically Signed   By: Virgina Norfolk M.D.   On: 01/16/2021 22:27    ____________________________________________   PROCEDURES  Procedure(s) performed:   Procedures  None  ____________________________________________   INITIAL IMPRESSION / ASSESSMENT AND PLAN / ED COURSE  Pertinent labs & imaging results that were available during my care of the patient were reviewed by me and considered in my medical decision making (see chart for details).   Patient arrives to the emergency department after syncope event at home.  This is his second such event in the recent past with recent admit on 10/23.  Patient was found to have new A. Fib and got an echo during that hospitalization showing some reduced ejection fraction and wall motion abnormality.  He was referred to the A. fib clinic who recommended cardioversion but he would need ischemic work-up first.  He saw Dr. Radford Pax on 11/11 who has ordered an outpatient Myoview which appears to be scheduled for tomorrow.  Patient does not recall this specifically.  With recent worsening EF and cardiomyopathy, the risk for ventricular arrhythmia seems somewhat elevated.  Patient may benefit from additional observation time and cardiology consultation for consideration of inpatient ischemic w/u.   Care transferred to Dr. Wyvonnia Dusky to follow labs and admit. Patient with orthostatic symptoms with standing. Metoprolol held with A-fib  rate around 90 rather than low 100s.  ____________________________________________  FINAL CLINICAL IMPRESSION(S) / ED DIAGNOSES  Final diagnoses:  LOC (loss of consciousness) ([place])     MEDICATIONS GIVEN DURING THIS VISIT:  Medications  metoprolol tartrate (LOPRESSOR) injection 5 mg (0 mg Intravenous Hold 01/16/21 2247)  lactated ringers infusion ( Intravenous New Bag/Given 01/17/21 0338)  acetaminophen (TYLENOL) tablet 650 mg (has no administration in time range)    Or  acetaminophen (TYLENOL) suppository 650 mg (has no administration in time range)  polyethylene glycol (MIRALAX / GLYCOLAX) packet 17 g (has no administration in time range)  apixaban (ELIQUIS) tablet 2.5 mg (2.5 mg Oral Given 01/17/21  0927)  rosuvastatin (CRESTOR) tablet 40 mg (40 mg Oral Given 01/17/21 0927)  regadenoson (LEXISCAN) injection SOLN 0.4 mg (has no administration in time range)    Note:  This document was prepared using Dragon voice recognition software and may include unintentional dictation errors.  Nanda Quinton, MD, [facility] Emergency Medicine    Thaer Miyoshi, [facility], MD 01/17/21 1524

## 2021-01-16 NOTE — ED Notes (Signed)
Patient transported to CT 

## 2021-01-17 ENCOUNTER — Ambulatory Visit (HOSPITAL_COMMUNITY): Payer: Medicare Other

## 2021-01-17 DIAGNOSIS — W19XXXA Unspecified fall, initial encounter: Secondary | ICD-10-CM | POA: Diagnosis not present

## 2021-01-17 DIAGNOSIS — I4819 Other persistent atrial fibrillation: Secondary | ICD-10-CM | POA: Diagnosis not present

## 2021-01-17 DIAGNOSIS — R634 Abnormal weight loss: Secondary | ICD-10-CM | POA: Diagnosis not present

## 2021-01-17 DIAGNOSIS — R55 Syncope and collapse: Secondary | ICD-10-CM

## 2021-01-17 DIAGNOSIS — I951 Orthostatic hypotension: Secondary | ICD-10-CM

## 2021-01-17 LAB — RESP PANEL BY RT-PCR (FLU A&B, COVID) ARPGX2
Influenza A by PCR: NEGATIVE
Influenza B by PCR: NEGATIVE
SARS Coronavirus 2 by RT PCR: NEGATIVE

## 2021-01-17 LAB — BASIC METABOLIC PANEL
Anion gap: 10 (ref 5–15)
BUN: 17 mg/dL (ref 8–23)
CO2: 24 mmol/L (ref 22–32)
Calcium: 8.9 mg/dL (ref 8.9–10.3)
Chloride: 103 mmol/L (ref 98–111)
Creatinine, Ser: 1.46 mg/dL — ABNORMAL HIGH (ref 0.61–1.24)
GFR, Estimated: 48 mL/min — ABNORMAL LOW (ref >60)
Glucose, Bld: 140 mg/dL — ABNORMAL HIGH (ref 70–99)
Potassium: 3.6 mmol/L (ref 3.5–5.1)
Sodium: 137 mmol/L (ref 135–145)

## 2021-01-17 LAB — URINALYSIS, ROUTINE W REFLEX MICROSCOPIC
Bacteria, UA: NONE SEEN
Bilirubin Urine: NEGATIVE
Glucose, UA: >=500 mg/dL — AB
Ketones, ur: NEGATIVE mg/dL
Leukocytes,Ua: NEGATIVE
Nitrite: NEGATIVE
Protein, ur: 30 mg/dL — AB
Specific Gravity, Urine: 1.017 (ref 1.005–1.030)
pH: 5 (ref 5.0–8.0)

## 2021-01-17 LAB — GLUCOSE, CAPILLARY: Glucose-Capillary: 111 mg/dL — ABNORMAL HIGH (ref 70–99)

## 2021-01-17 LAB — MAGNESIUM: Magnesium: 2.1 mg/dL (ref 1.7–2.4)

## 2021-01-17 LAB — TROPONIN I (HIGH SENSITIVITY): Troponin I (High Sensitivity): 21 ng/L — ABNORMAL HIGH (ref <18)

## 2021-01-17 LAB — TSH: TSH: 1.329 u[IU]/mL (ref 0.350–4.500)

## 2021-01-17 LAB — BETA-HYDROXYBUTYRIC ACID: Beta-Hydroxybutyric Acid: 0.26 mmol/L (ref 0.05–0.27)

## 2021-01-17 LAB — PHOSPHORUS: Phosphorus: 3.5 mg/dL (ref 2.5–4.6)

## 2021-01-17 MED ORDER — ROSUVASTATIN CALCIUM 20 MG PO TABS
40.0000 mg | ORAL_TABLET | Freq: Every day | ORAL | Status: DC
  Administered 2021-01-17 – 2021-01-18 (×2): 40 mg via ORAL
  Filled 2021-01-17 (×2): qty 2

## 2021-01-17 MED ORDER — LACTATED RINGERS IV SOLN: INTRAVENOUS | Status: AC

## 2021-01-17 MED ORDER — ACETAMINOPHEN 650 MG RE SUPP: 650.0000 mg | Freq: Four times a day (QID) | RECTAL | Status: DC | PRN

## 2021-01-17 MED ORDER — REGADENOSON 0.4 MG/5ML IV SOLN
0.4000 mg | Freq: Once | INTRAVENOUS | Status: AC
  Filled 2021-01-17 (×2): qty 5

## 2021-01-17 MED ORDER — APIXABAN 2.5 MG PO TABS
2.5000 mg | ORAL_TABLET | Freq: Two times a day (BID) | ORAL | Status: DC
  Administered 2021-01-17 – 2021-01-18 (×3): 2.5 mg via ORAL
  Filled 2021-01-17 (×3): qty 1

## 2021-01-17 MED ORDER — ACETAMINOPHEN 325 MG PO TABS: 650.0000 mg | ORAL_TABLET | Freq: Four times a day (QID) | ORAL | Status: DC | PRN

## 2021-01-17 MED ORDER — POLYETHYLENE GLYCOL 3350 17 G PO PACK: 17.0000 g | PACK | Freq: Every day | ORAL | Status: DC | PRN

## 2021-01-17 NOTE — Progress Notes (Signed)
Subjective: Patient was admitted overnight for possible syncopal episode.  Evaluated at the bedside this morning and denies any complaints.  Wife at the bedside who gave most of the history.  She reported that he has difficulty swallowing pills and sometimes has dysphagia with too much food but this has not been a big problem.  She states that he has been able to do everything since her stroke but much slower. He has had significant weight loss since his stroke, of note he was trying to lose weight prior to his stroke. He endorses significant loss of appetite. Denies nausea. Endorses constipation and diarrhea, taking colestipol because without it has diarrhea. Wife states he did not fall yesterday. She was mostly concerned about his nightmares when he wakes up screaming.This has been going on since his stroke. He also talks in his sleep and this is new since his stroke.   Objective:  Vital signs in last 24 hours: Vitals:   01/17/21 1300 01/17/21 1330 01/17/21 1400 01/17/21 1430  BP: 101/77 111/84 102/69 113/70  Pulse: 100 84 97 95  Resp: (!) 21 (!) 24 19 18   Temp:      TempSrc:      SpO2: 97% 97% 97% 94%  Weight:      Height:       Physical Exam General: chronically ill appearing, NAD HEENT: Normocephalic, atraumatic, EOM intact, conjunctiva normal CV: irregular rhythm, no murmurs rubs or gallops Pulm: Clear to auscultation bilaterally, normal work of breathing Abdomen: Soft, nondistended, bowel sounds present, no tenderness to palpation MSK: R>L 2+ pitting edema Skin: Warm and dry, hyperkeratotic lesion of the left ear with hyperpigmentation and irritation/bleeding Neuro: Alert  Assessment/Plan:  Principal Problem:   Atrial fibrillation (HCC)  Possible fall versus syncope Positive orthostatic vitals Does not appear to be cardiogenic in nature.  Possibly in part from deconditioning since stroke in May.  Patient's orthostatic vitals were positive on admission.  Does report poor  oral intake and approximately 70 pound weight loss and stroke.  Patient denies any dysphagia or odontophagia but just has a decreased appetite.  Patient given 500 cc of fluid.  Will recheck orthostatic vitals. -Continue telemetry -Recheck orthostatic vitals  Atrial fibrillation on Eliquis HFrEF Cardiology is planning for a Myoview tomorrow.  N.p.o. at midnight. -Continue Eliquis   History of sigmoid colon cancer Status post partial colectomy in 2018.  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  Last seen by oncology in 2018 and since has followed with PCP for serial CEA levels. -Follow-up CEA level  History of CVA Weight loss Recent left MCA territory infarct earlier this year in May.  Since this time he lost approximately 70 pounds.  Endorses decreased appetite but denies any difficulty swallowing solids or liquids.  Low suspicion of recurrence of colon cancer.  Weight loss likely related to stroke.  Patient is also reportedly having night terrors which may be related to previous stroke.  Will continue to monitor. -Consult SLP and dietitian -PT evaluation -Continue rosuvastatin 40 mg daily  Hyperkeratotic lesion of left ear Concerning for possible squamous cell carcinoma. Recommend outpatient dermatology follow-up for possible biopsy.  Type 2 DM Home medications include Wilder Glade, patient reports not taking discontinued a week ago by PCP?  Possibly held due to low blood pressures.  Will need to clarify.  Prior to Admission Living Arrangement: Home Anticipated Discharge Location: Home Barriers to Discharge: Clinical improvement and work-up Dispo: Anticipated discharge in approximately 1 day(s).    Corky Sox, MD PGY-1  Pager: [phone]

## 2021-01-17 NOTE — Consult Note (Signed)
CARDIOLOGY CONSULT NOTE       Patient ID: Edwin Wall MRN: [id number] DOB/AGE: 10-08-1940 80 y.o.  Admit date: 01/16/2021 Referring Physician: Rick Duff Primary Physician: [name], MD Primary Cardiologist: Radford Pax Reason for Consultation: Syncope/Afib  Principal Problem:   Atrial fibrillation [facility])   HPI:  80 y.o. with stage 3 colon cancer post surgery with what appears to be good prognosis. Stroke in May with mild residual right sided weakness and some dysphagia. Found to be in afib. Started on eliquis CHADVASC 5 and not on AV nodal drugs. TTE 12/24/20 with EF 45-50% apical RWMA moderate LAE, mild RAE no significant valve dx. Was to have myovue in office today. No chest pain palpitations or dyspnea. Had a ? Mechanical fall in October carrying heavy load down back porch in October Has felt weak and lost 70 lbs over the last year since his colon surgery and stroke. Lives with wife and son. Has had constipation and nausea this week and had "pre syncope" after going to bathroom or trying to. Went back to bed and was hard to arouse While in bed ? Sleeping has rigidity, screaming and altered breathing and difficult to arouse No overt seizure activity His postural signs are positive in ER. In ER in afib with reasonable rates no long pauses or bradycardia EMS noted minimally responsive in afib not bradycardic BS a bit high BP ok and given 500 cc bolus NS   ROS All other systems reviewed and negative except as noted above  Past Medical History:  Diagnosis Date   Anxiety    Arthritis    Cataract    Colon cancer ([place]) dx'd 11/2016   "stage 3"   Gout    Gout    Hemorrhoids    History of kidney stones    "passed it"   Hypertension    Pre-diabetes     Family History  Problem Relation Age of Onset   Cancer Mother        LUNG   COPD Father     Social History   Socioeconomic History   Marital status: Married    Spouse name: Not on file   Number of children:  Not on file   Years of education: Not on file   Highest education level: Not on file  Occupational History   Not on file  Tobacco Use   Smoking status: Never   Smokeless tobacco: Never  Vaping Use   Vaping Use: Never used  Substance and Sexual Activity   Alcohol use: No   Drug use: No   Sexual activity: Never  Other Topics Concern   Not on file  Social History Narrative   Not on file   Social Determinants of Health   Financial Resource Strain: Not on file  Food Insecurity: Not on file  Transportation Needs: Not on file  Physical Activity: Not on file  Stress: Not on file  Social Connections: Not on file  Intimate Partner Violence: Not on file    Past Surgical History:  Procedure Laterality Date   COLECTOMY  12/17/2016   lap; partial sigmoid colectomy/notes 12/17/2016   COLONOSCOPY W/ BIOPSIES AND POLYPECTOMY  11/11/2016   LAPAROSCOPIC SIGMOID COLECTOMY [name] 12/17/2016   Procedure: LAPAROSCOPIC SIGMOID COLECTOMY;  Surgeon: [name], MD;  Location: [facility];  Service: General;  Laterality: [name];   NASAL SEPTUM SURGERY     TONSILLECTOMY        Current Facility-Administered Medications:    acetaminophen (TYLENOL) tablet 650  mg, 650 mg, Oral, Q6H PRN **OR** acetaminophen (TYLENOL) suppository 650 mg, 650 mg, Rectal, Q6H PRN, Rick Duff, MD   apixaban Arne [place]) tablet 2.5 mg, 2.5 mg, Oral, BID, Rick Duff, MD   metoprolol tartrate (LOPRESSOR) injection 5 mg, 5 mg, Intravenous, Once, Long, Wonda [employer], MD   polyethylene glycol (MIRALAX / GLYCOLAX) packet 17 g, 17 g, Oral, Daily PRN, Rick Duff, MD   rosuvastatin (CRESTOR) tablet 40 mg, 40 mg, Oral, Daily, Rick Duff, MD  Current Outpatient Medications:    amantadine (SYMMETREL) 100 MG capsule, Take 2 capsules (200 mg total) by mouth daily. For aphasia, Disp: 60 capsule, Rfl: 5   apixaban (ELIQUIS) 2.5 MG TABS tablet, Take 1 tablet (2.5 mg total) by mouth 2 (two) times daily., Disp: 180 tablet, Rfl:  3   colestipol (COLESTID) 1 g tablet, Take 1 tablet (1 g total) by mouth 2 (two) times daily., Disp: 30 tablet, Rfl: 0   dapagliflozin propanediol (FARXIGA) 10 MG TABS tablet, Take 1 tablet (10 mg total) by mouth daily. (Patient not taking: Reported on 01/11/2021), Disp: 30 tablet, Rfl: 0   LORazepam (ATIVAN) 1 MG tablet, Take 1 tablet (1 mg total) by mouth daily as needed for anxiety., Disp: 30 tablet, Rfl:    rosuvastatin (CRESTOR) 40 MG tablet, Take 1 tablet (40 mg total) by mouth daily., Disp: 30 tablet, Rfl: 0   senna-docusate (SENOKOT-S) 8.6-50 MG tablet, Take 1 tablet by mouth 2 (two) times daily., Disp: 60 tablet, Rfl: 0  apixaban  2.5 mg Oral BID   metoprolol tartrate  5 mg Intravenous Once   rosuvastatin  40 mg Oral Daily     Physical Exam: Blood pressure 135/77, pulse 83, temperature 98.5 F (36.9 C), temperature source Oral, resp. rate 19, height 5\' 8"  (1.727 m), weight 91.6 kg, SpO2 98 %.    Elderly male Speech ok Lungs clear No murmur  Post sigmoid colectomy No edema Mild RUE weakness   Labs:   Lab Results  Component Value Date   WBC 13.7 (H) 01/16/2021   HGB 14.7 01/16/2021   HCT 45.5 01/16/2021   MCV 95.6 01/16/2021   PLT 197 01/16/2021    Recent Labs  Lab 01/16/21 2210 01/17/21 0326  NA 137 137  K 3.7 3.6  CL 104 103  CO2 19* 24  BUN 16 17  CREATININE 1.67* 1.46*  CALCIUM 9.1 8.9  PROT 5.7*  --   BILITOT 1.7*  --   ALKPHOS 90  --   ALT 17  --   AST 26  --   GLUCOSE 177* 140*   No results found for: CKTOTAL, CKMB, CKMBINDEX, TROPONINI  Lab Results  Component Value Date   CHOL 179 07/16/2020   CHOL 162 01/15/2011   Lab Results  Component Value Date   HDL 34 (L) 07/16/2020   HDL 35 (L) 01/15/2011   Lab Results  Component Value Date   LDLCALC 127 (H) 07/16/2020   LDLCALC 96 01/15/2011   Lab Results  Component Value Date   TRIG 92 07/16/2020   TRIG 156 (H) 01/15/2011   Lab Results  Component Value Date   CHOLHDL 5.3 07/16/2020    CHOLHDL 4.6 01/15/2011   No results found for: LDLDIRECT    Radiology: CT Head Wo Contrast  Result Date: 01/16/2021 CLINICAL DATA:  Syncope. EXAM: CT HEAD WITHOUT CONTRAST TECHNIQUE: Contiguous axial images were obtained from the base of the skull through the vertex without intravenous contrast. COMPARISON:  None. FINDINGS: Brain: There is  mild cerebral atrophy with widening of the extra-axial spaces and ventricular dilatation. There are areas of decreased attenuation within the white matter tracts of the supratentorial brain, consistent with microvascular disease changes. A stable area of cortical encephalomalacia, with adjacent chronic white matter low attenuation, is seen within the left frontal parietal region. Small chronic bilateral basal ganglia lacunar infarcts are also present. Vascular: No hyperdense vessel or unexpected calcification. Skull: Normal. Negative for fracture or focal lesion. Sinuses/Orbits: There is mild to moderate severity sphenoid sinus mucosal thickening. Other: None. IMPRESSION: 1. Generalized cerebral atrophy. 2. Chronic left frontal parietal lobe infarct. 3. Small chronic bilateral basal ganglia lacunar infarcts. 4. No acute intracranial abnormality. Electronically Signed   By: Virgina Norfolk M.D.   On: 01/16/2021 23:07   CT HEAD WO CONTRAST  Result Date: 12/23/2020 CLINICAL DATA:  Neck trauma (Age >= 65y); Head trauma, minor (Age >= 65y) EXAM: CT HEAD WITHOUT CONTRAST CT CERVICAL SPINE WITHOUT CONTRAST TECHNIQUE: Multidetector CT imaging of the head and cervical spine was performed following the standard protocol without intravenous contrast. Multiplanar CT image reconstructions of the cervical spine were also generated. COMPARISON:  November 24, 2020 FINDINGS: CT HEAD FINDINGS Brain: No evidence of acute infarction, hemorrhage, hydrocephalus, extra-axial collection or mass lesion/mass effect. Revisualization of sequela of prior LEFT MCA and LEFT basal ganglia  infarction. Global parenchymal volume loss. Vascular: Vascular calcifications. Skull: No acute fracture. Sinuses/Orbits: Chronic appearing opacification with corresponding osseous changes of the sphenoid sinuses most consistent with chronic sinusitis. Trace bilateral mastoid effusions. Other: Bilateral ear cerumen. CT CERVICAL SPINE FINDINGS Alignment: No spondylolisthesis. Unchanged rating the cervical lordosis. Skull base and vertebrae: Unchanged lucency of C2 likely reflecting degenerative changes. No acute fracture. Soft tissues and spinal canal: No prevertebral fluid or swelling. No visible canal hematoma. Disc levels: Posterior disc osteophyte complex at C6-7 results in moderate to severe canal narrowing at this level. Posterior disc osteophyte complex at C3-4 results in moderate narrowing. 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 Multilevel intervertebral disc space height loss with uncovertebral hypertrophy and facet arthropathy. Multilevel osseous neuroforaminal narrowing most pronounced at C3-4 and C5-6 bilaterally. Upper chest: Negative. Other: None IMPRESSION: 1.  No acute intracranial abnormality. 2.  No acute fracture or static subluxation of the cervical spine. Electronically Signed   By: Valentino Saxon M.D.   On: 12/23/2020 16:11   CT CERVICAL SPINE WO CONTRAST  Result Date: 12/23/2020 CLINICAL DATA:  Neck trauma (Age >= 65y); Head trauma, minor (Age >= 65y) EXAM: CT HEAD WITHOUT CONTRAST CT CERVICAL SPINE WITHOUT CONTRAST TECHNIQUE: Multidetector CT imaging of the head and cervical spine was performed following the standard protocol without intravenous contrast. Multiplanar CT image reconstructions of the cervical spine were also generated. COMPARISON:  November 24, 2020 FINDINGS: CT HEAD FINDINGS Brain: No evidence of acute infarction, hemorrhage, hydrocephalus, extra-axial collection or mass lesion/mass effect. Revisualization of sequela of prior LEFT MCA and LEFT basal ganglia  infarction. Global parenchymal volume loss. Vascular: Vascular calcifications. Skull: No acute fracture. Sinuses/Orbits: Chronic appearing opacification with corresponding osseous changes of the sphenoid sinuses most consistent with chronic sinusitis. Trace bilateral mastoid effusions. Other: Bilateral ear cerumen. CT CERVICAL SPINE FINDINGS Alignment: No spondylolisthesis. Unchanged rating the cervical lordosis. Skull base and vertebrae: Unchanged lucency of C2 likely reflecting degenerative changes. No acute fracture. Soft tissues and spinal canal: No prevertebral fluid or swelling. No visible canal hematoma. Disc levels: Posterior disc osteophyte complex at C6-7 results in moderate to severe canal narrowing at this level. Posterior disc  osteophyte complex at C3-4 results in moderate narrowing. 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 Multilevel intervertebral disc space height loss with uncovertebral hypertrophy and facet arthropathy. Multilevel osseous neuroforaminal narrowing most pronounced at C3-4 and C5-6 bilaterally. Upper chest: Negative. Other: None IMPRESSION: 1.  No acute intracranial abnormality. 2.  No acute fracture or static subluxation of the cervical spine. Electronically Signed   By: Valentino Saxon M.D.   On: 12/23/2020 16:11   DG Chest Portable 1 View  Result Date: 01/16/2021 CLINICAL DATA:  Syncope. EXAM: PORTABLE CHEST 1 VIEW COMPARISON:  December 23, 2020 FINDINGS: There is no evidence of acute infiltrate, pleural effusion or pneumothorax. Mild patient rotation is noted. The heart size and mediastinal contours are within normal limits. Degenerative changes are seen within the thoracic spine. IMPRESSION: No active disease. Electronically Signed   By: Virgina Norfolk M.D.   On: 01/16/2021 22:27   DG Chest Portable 1 View  Result Date: 12/23/2020 CLINICAL DATA:  Fall EXAM: PORTABLE CHEST 1 VIEW COMPARISON:  11/24/2020 FINDINGS: The heart size and mediastinal contours are within  normal limits. Low lung volumes. No focal pulmonary abnormality. No pleural effusion the visualized skeletal structures are unremarkable. IMPRESSION: No active disease. Electronically Signed   By: Merilyn Baba M.D.   On: 12/23/2020 19:03   ECHOCARDIOGRAM COMPLETE  Result Date: 12/24/2020    ECHOCARDIOGRAM REPORT   Patient Name:   Edwin MONACELLI Date of Exam: 12/24/2020 Medical Rec #:  [id number]            Height:       68.0 in Accession #:    [id number]           Weight:       210.0 lb Date of Birth:  09/22/1940           BSA:          2.087 m Patient Age:    44 years             BP:           142/101 mmHg Patient Gender: M                    HR:           100 bpm. Exam Location:  Inpatient Procedure: 2D Echo, Cardiac Doppler, Color Doppler and Intracardiac            Opacification Agent Indications:    Syncope  History:        Patient has prior history of Echocardiogram examinations, most                 recent 07/15/2020. Risk Factors:Hypertension and Diabetes.  Sonographer:    Helmut Muster Referring Phys: [id number] NISCHAL NARENDRA IMPRESSIONS  1. Left ventricular ejection fraction, by estimation, is 45 to 50%. The left ventricle has mildly decreased function. The left ventricle demonstrates regional wall motion abnormalities (see scoring diagram/findings for description). The left ventricular  internal cavity size was mildly dilated. Left ventricular diastolic function could not be evaluated. There is severe akinesis of the left ventricular, apical anteroseptal wall and apical segment.  2. Right ventricular systolic function is normal. The right ventricular size is normal.  3. Left atrial size was moderately dilated.  4. Right atrial size was mildly dilated.  5. The mitral valve is grossly normal. Mild mitral valve regurgitation.  6. The aortic valve is normal in structure. Aortic valve regurgitation is not visualized. No aortic stenosis is  present. FINDINGS  Left Ventricle: Left ventricular  ejection fraction, by estimation, is 45 to 50%. The left ventricle has mildly decreased function. The left ventricle demonstrates regional wall motion abnormalities. Severe akinesis of the left ventricular, apical anteroseptal wall and apical segment. Definity contrast agent was given IV to delineate the left ventricular endocardial borders. The left ventricular internal cavity size was mildly dilated. There is no left ventricular hypertrophy. Left ventricular diastolic function could not be evaluated due to atrial fibrillation. Left ventricular diastolic function could not be evaluated. Right Ventricle: The right ventricular size is normal. Right vetricular wall thickness was not well visualized. Right ventricular systolic function is normal. Left Atrium: Left atrial size was moderately dilated. Right Atrium: Right atrial size was mildly dilated. Pericardium: There is no evidence of pericardial effusion. Mitral Valve: The mitral valve is grossly normal. Mild mitral valve regurgitation. Tricuspid Valve: The tricuspid valve is grossly normal. Tricuspid valve regurgitation is mild. Aortic Valve: The aortic valve is normal in structure. Aortic valve regurgitation is not visualized. No aortic stenosis is present. Pulmonic Valve: The pulmonic valve was grossly normal. Pulmonic valve regurgitation is trivial. Aorta: The aortic root and ascending aorta are structurally normal, with no evidence of dilitation. IAS/Shunts: The interatrial septum was not well visualized.  LEFT VENTRICLE PLAX 2D LVIDd:         4.90 cm      Diastology LVIDs:         3.50 cm      LV e' medial:  6.00 cm/s LV PW:         1.00 cm      LV e' lateral: 7.87 cm/s LV IVS:        1.00 cm LVOT diam:     2.40 cm LV SV:         61 LV SV Index:   29 LVOT Area:     4.52 cm  LV Volumes (MOD) LV vol d, MOD A2C: 138.5 ml LV vol d, MOD A4C: 144.0 ml LV vol s, MOD A2C: 68.5 ml LV vol s, MOD A4C: 72.1 ml LV SV MOD A2C:     70.0 ml LV SV MOD A4C:     144.0 ml LV SV  MOD BP:      74.1 ml RIGHT VENTRICLE             IVC RV S prime:     10.90 cm/s  IVC diam: 2.30 cm TAPSE (M-mode): 1.9 cm LEFT ATRIUM             Index        RIGHT ATRIUM           Index LA diam:        3.10 cm 1.49 cm/m   RA Area:     18.70 cm LA Vol (A2C):   75.1 ml 35.99 ml/m  RA Volume:   55.70 ml  26.69 ml/m LA Vol (A4C):   86.4 ml 41.41 ml/m LA Biplane Vol: 88.4 ml 42.37 ml/m  AORTIC VALVE LVOT Vmax:   71.30 cm/s LVOT Vmean:  50.100 cm/s LVOT VTI:    0.135 m  AORTA Ao Root diam: 3.70 cm Ao Asc diam:  3.80 cm TRICUSPID VALVE TR Peak grad:   34.1 mmHg TR Vmax:        292.00 cm/s  SHUNTS Systemic VTI:  0.14 m Systemic Diam: 2.40 cm Mertie Moores MD Electronically signed by Mertie Moores MD Signature Date/Time: 12/24/2020/3:27:46 PM    Final  EKG: Afib RBBB LAFB no acute changes    ASSESSMENT AND PLAN:   Syncope:  seems more like weakness with difficult to arouse MS changes rather than loss of consciousness. Troponin 21 no chest pain BNP only 235 Hct 45.5 TSH normal BUN 17 Cr 1.46 not azotemic. Suspect issues revolve around weight loss, CVA, cancer and age not arrhythmia Have asked EP to review chart but don't think ILR needed at this time. Follow on telemetry continue eliquis for now but does appear to be fall risk  CT head with old infarct no acute findings  Consider EEG given MS changes and stroke substrate  Low EF:  ECG with no obvious infarct Apical RWMA on echo EF 45-50%  CXR no active disease will order inpatient myovue tomorrow as he was supposed to have one at [facility] today Troponin negative and no chest pain Colon Cancer not on adjuvant Rx post colectomy  CVA:  from afib on eliquis needs continued PT/OT   Signed: Jenkins Rouge 01/17/2021, 8:56 AM

## 2021-01-17 NOTE — Plan of Care (Signed)

## 2021-01-17 NOTE — ED Notes (Signed)
Admitting Provider at bedside. 

## 2021-01-17 NOTE — Hospital Course (Addendum)
11/17: Has difficulty swallowing pills, sometimes has dysphagia with too much food Walks slower, can do everything just slower Weight loss: was trying to lose weight before stroke and then after stroke it really picked up - lack of appetite Does not have nausea often  + constipated and took sennakot  Never was constipated before the stroke- has been taking colestipol because without it he has diarrhea  ? Diarrhea yesterday  L ear lesion - blood on pillow- has darker areas (needs derm to get a bx as outpatient); see photo  Wife states that the patient did not fall yesterday  Has nightmares now since his stroke, talks in sleep and never did this before -- repeat MRI?  LR running at 100cc/hr   COLON CANCER stage II vs III? laparoscopic sigmoid colectomy on 12/17/2016. The pathology ([id number]) revealed well-differentiated invasive adenocarcinoma. Tumor extended into pericolonic soft tissue. The resection margins were negative. No macroscopic tumor perforation. No lymphovascular or perineural invasion. 30 lymph nodes were negative for metastatic carcinoma. There was an additional tubular adenoma and hyperplastic polyp.  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    Patient was recently admitted 10/23 for possible syncopal episode versus mechanical fall.  At that time, syncope was thought to be multifactorial in etiology.  Patient had A. fib diagnosed 07/2020, echo worsening EF and severe left ventricular akinesis, and he took prescribed amantadine and lorazepam.  He was scheduled for ischemic cardiac work up 11/17 and cardioversion.   However, patient's  wife reports that on the day of admission, the patient got up to use the restroom.  While his wife was watching television in the bedroom she heard a "thud" and went to check on her husband who denied a fall or loss of consciousness.   Patient was confused at bedside but denied being in pain, having headache, chest pain, or shortness of breath.   His wife notes that the patient has had a poor appetite since his stroke, losing about ~70lbs over the past 7 months or so, urinary urgency and incontinence, and finally an episode of screaming in his sleep, stiffening of his extremities, and being difficult to awake.    PMH:  Type 2 DM HLD  Dementia  A fib  Incontinence  HTN Anxiety  Colon cancer  Gout   Medications:  Lorazepam for anxiety  Eliquis Rosuvastatin  Farxiga Amantadine   Family History:  Mother: hx of unknown cancer Father: COPD   Social History:   Lives with wife and son (has severe OCD) Employment- retired.   Tobacco- Denies ever using  EtOH- Denies ever using  Illicit drug use- Denies ever using  IADLs/ADLs- Requires assistance at baseline      #?Syncopal episode Per ED note patient had a syncopal episode, however, at bedside his wife denies that patient fell or loss consciousness, though she does note that she heard a "thud" while the patient was in the bathroom and she was in their bedroom watching television. The patient does not recall any of the events on the night of admission.   Regarding potential syncopal episode, it is likely again multifactorial in etiology. Patient was recently diagnosed  with a fib and was scheduled to undergo an ischemic work up 11/17. In addition, he was found to have a reduced EF on recent echo with severe akinesis of the LV making cardiac cause likely contributor. Also considered, orthostatic hypotension given patient's positive orthostatic vital signs in the ED and his wife's report that he has had poor appetite. Finally, patient is  on multiple medications that could potentially lead to a syncopal episode including his amantadine and lorazepam.   Given patient has had another potential syncopal episode in a short period and his cardiac history will consult cardiology this AM to see if patient warrants inpatient ischemic work up and possible cardioversion.  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 ECHO EF 45 to 50% with severe akinesis of LV, apical anteroseptal Wall, and apical segment. Not currently on any medications for this and was scheduled to undergo  Not grossly fluid overload on exam. Denies DOE, [facility], and orthopnea. LCTAB. 2+ pitting edema on bilateral lower edema.  - HOLD Farxiga in setting of soft BP's - 2D echo pending   #T2DM -Resume farxiga when able   Best Practice: Diet: Heart Healthy IVF: LR,100cc/hr VTE: Eliquis  Code: Full

## 2021-01-17 NOTE — H&P (Signed)
Date: 01/17/2021               Patient Name:  Edwin Wall MRN: [id number]  DOB: [birth year] Age / Sex: 80 y.o., male   PCP: [name], MD         Medical Service: Internal Medicine Teaching Service         Attending Physician: Dr. Velna Ochs, MD    First Contact: Dr. Corky Sox, MD Pager: [phone]   Second Contact: Dr. Gaylan Gerold, DO  Pager: [phone]        After Hours (After 5p/  First Contact Pager: [phone]  weekends / holidays): Second Contact Pager: [phone]   Chief Complaint: Syncopal episode   History of Present Illness:  Mr. 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    Patient was recently admitted 10/23 for possible syncopal episode versus mechanical fall.  At that time, syncope was thought to be multifactorial in etiology.  Patient had A. fib diagnosed 07/2020, echo worsening EF and severe left ventricular akinesis, and he took prescribed amantadine and lorazepam.  He was scheduled for ischemic cardiac work up 11/17 and cardioversion.   However, patient's wife reports that on the day of admission, the patient got up to use the restroom.  While his wife was watching television in the bedroom she heard a "thud" and went to check on her husband who denied a fall or loss of consciousness.   Patient was confused at bedside but denied being in pain, having headache, chest pain, or shortness of breath.   His wife notes that the patient has had a poor appetite since his stroke, losing about ~70lbs over the past 7 months or so, urinary urgency and incontinence, and finally an episode of screaming in his sleep,  stiffening of his extremities, and being difficult to awake.    PMH:  Type 2 DM HLD  Dementia  A fib  Incontinence  HTN Anxiety  Colon cancer  Gout   Medications:  Lorazepam for anxiety  Eliquis Rosuvastatin  Farxiga Amantadine   Family History:  Mother: hx of unknown cancer Father: COPD   Social History:   Lives with wife and son (has severe OCD) Employment- retired.   Tobacco- Denies ever using  EtOH- Denies ever using  Illicit drug use- Denies ever using  IADLs/ADLs- Requires assistance at baseline      Allergies: Allergies as of 01/16/2021   (No Known Allergies)   Past Medical History:  Diagnosis Date   Anxiety    Arthritis    Cataract    Colon cancer ([place]) dx'd 11/2016   "stage 3"   Gout    Gout    Hemorrhoids    History of kidney stones    "passed it"   Hypertension    Pre-diabetes       Review of Systems: A complete ROS was negative except as per HPI.   Physical Exam: Blood pressure (!) 145/94, pulse 69, temperature 98.1 F (36.7 C), temperature source Oral, resp. rate (!) 21, height 5\' 8"  (1.727 m), weight 91.6 kg, SpO2 99 %.  Gen: Frail elderly, no acute distress CV: Irregularly  irregular, 2+ pitting edema of BLE  Pulm: Non labored breathing on RA, CTAB, no wheezing or rales  Abd: Soft, NT, ND  Neuro: Strength 5/5 BUE/BLE, alert and oriented to person and place    EKG: personally reviewed my interpretation is unchanged   CXR: personally reviewed my interpretation is no acute cardiopulmonary process  Assessment & Plan by Problem: Principal Problem:   Atrial fibrillation ([place])  #?Syncopal episode Per ED note patient had a syncopal episode, however, at bedside his wife denies that patient fell or loss consciousness, though she does note that she heard a "thud" while the patient was in the bathroom and she was in their bedroom watching television. The patient does not recall any of the events on the night of admission.   Regarding  potential syncopal episode, it is likely again multifactorial in etiology. Patient was recently diagnosed with a fib and was scheduled to undergo an ischemic work up 11/17. In addition, he was found to have a reduced EF on recent echo with severe akinesis of the LV making cardiac cause likely contributor. Also considered, orthostatic hypotension given patient's positive orthostatic vital signs in the ED and his wife's report that he has had poor appetite. Finally, patient is on multiple medications that could potentially lead to a syncopal episode including his amantadine and lorazepam.   Given patient has had another potential syncopal episode in a short period and his cardiac history will consult cardiology this AM to see if patient warrants inpatient ischemic work up and possible cardioversion.  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 ECHO EF 45 to 50% with severe akinesis of LV, apical anteroseptal wall, and apical segment. Not currently on any medications for this and was scheduled to undergo  Denies DOE, [facility], and orthopnea. LCTAB. 2+ pitting edema on bilateral lower edema. Per patient's wife his lower extremity edema is stable.  Wilder Glade previously held due to low blood pressures.  - Hold on diuresis for now  #T2DM -Resume farxiga when able  Best Practice: Diet: Heart Healthy IVF: LR,100cc/hr VTE: Eliquis  Code: Full  Observation with expected length of stay less than 2 midnights. Dispo: Admit patient to Observation with expected length of stay less than 2 midnights.  Signed: Rick Duff, MD 01/17/2021, 6:10 AM  PGY-2 Internal  Medicine  After 5pm on weekdays and 1pm on weekends: On Call pager: [phone]

## 2021-01-17 NOTE — ED Notes (Signed)
Pt urinated on the stretcher. Pt disoriented and did not know his name. Pt cleansed of stool and urine, new linen placed on stretcher, brief applied, condom cath placed on pt. Wife states pt has had a increased issue with incontinence since his stroke. Pt speaking clearer after procedure and answering questions more appropriately. No acute changes noted. Will continue to monitor.

## 2021-01-17 NOTE — Evaluation (Signed)
Occupational Therapy Evaluation Patient Details Name: Edwin Wall MRN: [id number] DOB: 07-16-1940 Today's Date: 01/17/2021   History of Present Illness Pt is an 80 y/o male admitted 11/16 secondary to decreased responsiveness. Pt also found to be in a fib. PMH includes colon cancer, CVA with R deficits, HTN, gout, and DM.   Clinical Impression   Patient admitted for the diagnosis above.  PTA he lives with his spouse, was walking without an AD, completed his own ADL for a sit stand level, but the spouse assisted with IADL and community mobility.  Deficits impacting independence are listed below.  Currently he is needing up to [facility] for basic mobility and ADL completion.  Patient should return to his baseline fairly quickly, and no post acute OT is anticipated.  OT will follow in the acute setting to maximize his functional status.        Recommendations for follow up therapy are one component of a multi-disciplinary discharge planning process, led by the attending physician.  Recommendations may be updated based on patient status, additional functional criteria and insurance authorization.   Follow Up Recommendations  No OT follow up    Assistance Recommended at Discharge Intermittent Supervision/Assistance  Functional Status Assessment  Patient has had a recent decline in their functional status and demonstrates the ability to make significant improvements in function in a reasonable and predictable amount of time.  Equipment Recommendations  None recommended by OT    Recommendations for Other Services       Precautions / Restrictions Precautions Precautions: Fall Precaution Comments: Watch BP and HR Restrictions Weight Bearing Restrictions: No      Mobility Bed Mobility Overal bed mobility: Needs Assistance Bed Mobility: Supine to Sit;Sit to Supine     Supine to sit: Min assist Sit to supine: Min guard   General bed mobility comments: Min A for trunk  elevation to sit up, guarded patient's head with supine from stretcher rail Patient Response: Anxious  Transfers Overall transfer level: Needs assistance Equipment used: Rolling walker (2 wheels) Transfers: Sit to/from Stand Sit to Stand: Min guard                  Balance Overall balance assessment: Needs assistance Sitting-balance support: No upper extremity supported;Feet supported Sitting balance-Leahy Scale: Good     Standing balance support: Bilateral upper extremity supported Standing balance-Leahy Scale: Poor Standing balance comment: Reliant on BUE support                           ADL either performed or assessed with clinical judgement   ADL       Grooming: Set up;Supervision/safety;Sitting   Upper Body Bathing: Supervision/ safety;Sitting   Lower Body Bathing: Min guard;Sit to/from stand   Upper Body Dressing : Supervision/safety;Sitting   Lower Body Dressing: Sit to/from stand;Min guard   Toilet Transfer: Min guard;Stand-pivot;Rolling walker (2 wheels)           Functional mobility during ADLs: Min guard;Rolling walker (2 wheels) General ADL Comments: Pt presenting with decreased balance and cognition. Close to baseline.     Vision Baseline Vision/History: 1 Wears glasses Patient Visual Report: No change from baseline       Perception Perception Perception: Not tested   Praxis Praxis Praxis: Not tested    Pertinent Vitals/Pain Pain Assessment: No/denies pain     Hand Dominance Left   Extremity/Trunk Assessment Upper Extremity Assessment Upper Extremity Assessment: Overall WFL for tasks  assessed   Lower Extremity Assessment Lower Extremity Assessment: Defer to PT evaluation   Cervical / Trunk Assessment Cervical / Trunk Assessment: Normal   Communication Communication Communication: No difficulties   Cognition Arousal/Alertness: Awake/alert Behavior During Therapy: WFL for tasks assessed/performed Overall  Cognitive Status: History of cognitive impairments - at baseline                                 General Comments: confusion noted with short term memory issues.     General Comments       Exercises     Shoulder Instructions      Home Living Family/patient expects to be discharged to:: Private residence Living Arrangements: Spouse/significant other;Children Available Help at Discharge: Family;Available 24 hours/day Type of Home: House Home Access: Stairs to enter [employer] of Steps: 5 in front of home and 2 in back of home Entrance Stairs-Rails: Right;Left;Can reach both Home Layout: One level     Bathroom Shower/Tub: Tub/shower unit;Walk-in shower   Bathroom Toilet: Handicapped height Bathroom Accessibility: Yes How Accessible: Accessible via walker Home Equipment: Cane - single point;Shower Land (2 wheels);Rollator (4 wheels)          Prior Functioning/Environment Prior Level of Function : Independent/Modified Independent               ADLs Comments: independent with ADLs. Wife does IADLs and driving        OT Problem List: Decreased strength;Decreased range of motion;Decreased activity tolerance;Impaired balance (sitting and/or standing);Decreased knowledge of use of DME or AE;Decreased knowledge of precautions      OT Treatment/Interventions: Self-care/ADL training;Therapeutic exercise;Energy conservation;DME and/or AE instruction;Therapeutic activities;Patient/family education    OT Goals(Current goals can be found in the care plan section) Acute Rehab OT Goals Patient Stated Goal: Return home OT Goal Formulation: With patient Time For Goal Achievement: 01/31/21 Potential to Achieve Goals: Good ADL Goals Pt Will Perform Grooming: with modified independence;standing Pt Will Perform Lower Body Bathing: with modified independence;sit to/from stand Pt Will Perform Lower Body Dressing: with modified  independence;sit to/from stand Pt Will Transfer to Toilet: with modified independence;ambulating  OT Frequency: Min 2X/week   Barriers to D/C:    none noted       Co-evaluation              AM-PAC OT "6 Clicks" Daily Activity     Outcome Measure Help from another person eating meals?: None Help from another person taking care of personal grooming?: A Little Help from another person toileting, which includes using toliet, bedpan, or urinal?: A Little Help from another person bathing (including washing, rinsing, drying)?: A Little Help from another person to put on and taking off regular upper body clothing?: None Help from another person to put on and taking off regular lower body clothing?: A Little 6 Click Score: 20   End of Session Equipment Utilized During Treatment: Rolling walker (2 wheels)  Activity Tolerance: Patient tolerated treatment well Patient left: in bed;with call bell/phone within reach;with family/visitor present  OT Visit Diagnosis: Other abnormalities of gait and mobility (R26.89);Unsteadiness on feet (R26.81);Muscle weakness (generalized) (M62.81)                Time: 1511-1530 OT Time Calculation (min): 19 min Charges:  OT General Charges $OT Visit: 1 Visit OT Evaluation $OT Eval Moderate Complexity: 1 Mod  01/17/2021  RP, OTR/L  Acute Rehabilitation Services  Office:  [phone]  Cherylin Waguespack D Eleina Jergens 01/17/2021, 3:38 PM

## 2021-01-17 NOTE — Evaluation (Signed)
Physical Therapy Evaluation Patient Details Name: Edwin Wall MRN: [id number] DOB: 07/29/1940 Today's Date: 01/17/2021  History of Present Illness  Pt is an 80 y/o male admitted 11/16 secondary to decreased responsiveness. Pt also found to be in a fib. PMH includes colon cancer, CVA with R deficits, HTN, gout, and DM.  Clinical Impression  Pt admitted secondary to problem above with deficits below. Pt requiring Min A for bed mobility and min guard A to stand and take side steps at EOB. BP soft in standing, so further mobility deferred. Anticipate pt will progress well once symptoms improve. Will continue to follow acutely.    01/17/21 1034  Orthostatic Lying   BP- Lying 115/73  Pulse- Lying 85  Orthostatic Sitting  BP- Sitting 98/65  Pulse- Sitting 103  Orthostatic Standing at 3 minutes  BP- Standing at 3 minutes 96/58  Pulse- Standing at 3 minutes 115         Recommendations for follow up therapy are one component of a multi-disciplinary discharge planning process, led by the attending physician.  Recommendations may be updated based on patient status, additional functional criteria and insurance authorization.  Follow Up Recommendations Home health PT    Assistance Recommended at Discharge Frequent or constant Supervision/Assistance  Functional Status Assessment Patient has had a recent decline in their functional status and demonstrates the ability to make significant improvements in function in a reasonable and predictable amount of time.  Equipment Recommendations  None recommended by PT    Recommendations for Other Services       Precautions / Restrictions Precautions Precautions: Other (comment) Precaution Comments: Watch BP Restrictions Weight Bearing Restrictions: No      Mobility  Bed Mobility Overal bed mobility: Needs Assistance Bed Mobility: Supine to Sit;Sit to Supine     Supine to sit: Min assist Sit to supine: Min guard   General bed  mobility comments: Min A for trunk elevation to come to sittin gon stretcher    Transfers Overall transfer level: Needs assistance Equipment used: Rolling walker (2 wheels) Transfers: Sit to/from Stand Sit to Stand: Min guard           General transfer comment: Min guard for safety    Ambulation/Gait Ambulation/Gait assistance: Min guard   Assistive device: Rolling walker (2 wheels)         General Gait Details: Took side steps at EOB. BP dropped to 96/53 so further mobility deferred  Stairs            Wheelchair Mobility    Modified Rankin (Stroke Patients Only)       Balance Overall balance assessment: Needs assistance Sitting-balance support: No upper extremity supported;Feet supported Sitting balance-Leahy Scale: Fair     Standing balance support: Bilateral upper extremity supported Standing balance-Leahy Scale: Poor Standing balance comment: Reliant on BUE support                             Pertinent Vitals/Pain Pain Assessment: No/denies pain    Home Living Family/patient expects to be discharged to:: Private residence Living Arrangements: Spouse/significant other;Children Available Help at Discharge: Family;Available 24 hours/day Type of Home: House Home Access: Stairs to enter Entrance Stairs-Rails: Right;Left;Can reach both (on the front) Entrance Stairs-Number of Steps: 5 in front of home and 2 in back of home   Home Layout: One level Home Equipment: Cane - single point;Shower Land (2 wheels);Rollator (4 wheels)      Prior  Function Prior Level of Function : Independent/Modified Independent                     Hand Dominance        Extremity/Trunk Assessment   Upper Extremity Assessment Upper Extremity Assessment: Defer to OT evaluation    Lower Extremity Assessment Lower Extremity Assessment: Generalized weakness (R deficits at baseline)    Cervical / Trunk Assessment Cervical / Trunk  Assessment: Normal  Communication   Communication: No difficulties  Cognition Arousal/Alertness: Awake/alert Behavior During Therapy: WFL for tasks assessed/performed Overall Cognitive Status: History of cognitive impairments - at baseline                                          General Comments      Exercises     Assessment/Plan    PT Assessment Patient needs continued PT services  PT Problem List Decreased strength;Decreased activity tolerance;Decreased balance;Decreased mobility;Decreased knowledge of use of DME;Decreased safety awareness;Decreased knowledge of precautions       PT Treatment Interventions DME instruction;Gait training;Functional mobility training;Stair training;Therapeutic exercise;Therapeutic activities;Balance training;Patient/family education    PT Goals (Current goals can be found in the Care Plan section)  Acute Rehab PT Goals Patient Stated Goal: to go home PT Goal Formulation: With patient Time For Goal Achievement: 01/31/21 Potential to Achieve Goals: Good    Frequency Min 3X/week   Barriers to discharge        Co-evaluation               AM-PAC PT "6 Clicks" Mobility  Outcome Measure Help needed turning from your back to your side while in a flat bed without using bedrails?: None Help needed moving from lying on your back to sitting on the side of a flat bed without using bedrails?: A Little Help needed moving to and from a bed to a chair (including a wheelchair)?: A Little Help needed standing up from a chair using your arms (e.g., wheelchair or bedside chair)?: A Little Help needed to walk in hospital room?: A Little Help needed climbing 3-5 steps with a railing? : A Lot 6 Click Score: 18    End of Session Equipment Utilized During Treatment: Gait belt Activity Tolerance: Treatment limited secondary to medical complications (Comment) (low BP) Patient left: in bed;with call bell/phone within reach;with  family/visitor present (on stretcher in ED) Nurse Communication: Mobility status PT Visit Diagnosis: Unsteadiness on feet (R26.81);Muscle weakness (generalized) (M62.81)    Time: 7903-8333 PT Time Calculation (min) (ACUTE ONLY): 18 min   Charges:   PT Evaluation $PT Eval Moderate Complexity: 1 Mod          Reuel Derby, PT, DPT  Acute Rehabilitation Services  Pager: [phone] Office: [phone]   Rudean Hitt 01/17/2021, 2:17 PM

## 2021-01-17 NOTE — Progress Notes (Deleted)
Subjective: Patient was admitted overnight for possible syncopal episode.  Evaluated at the bedside this morning and denies any complaints.  Wife at the bedside who gave most of the history.  She reported that he has difficulty swallowing pills and sometimes has dysphagia with too much food but this has not been a big problem.  She states that he has been able to do everything since her stroke but much slower. He has had significant weight loss since his stroke, of note he was trying to lose weight prior to his stroke. He endorses significant loss of appetite. Denies nausea. Endorses constipation and diarrhea, taking colestipol because without it has diarrhea. Wife states he did not fall yesterday. She was mostly concerned about his nightmares when he wakes up screaming.This has been going on since his stroke. He also talks in his sleep and this is new since his stroke.   Objective:  Vital signs in last 24 hours: Vitals:   01/17/21 1300 01/17/21 1330 01/17/21 1400 01/17/21 1430  BP: 101/77 111/84 102/69 113/70  Pulse: 100 84 97 95  Resp: (!) 21 (!) 24 19 18   Temp:      TempSrc:      SpO2: 97% 97% 97% 94%  Weight:      Height:       Physical Exam General: chronically ill appearing, NAD HEENT: Normocephalic, atraumatic, EOM intact, conjunctiva normal CV: irregular rhythm, no murmurs rubs or gallops Pulm: Clear to auscultation bilaterally, normal work of breathing Abdomen: Soft, nondistended, bowel sounds present, no tenderness to palpation MSK: R>L 2+ pitting edema Skin: Warm and dry, hyperkeratotic lesion of the left ear with hyperpigmentation and irritation/bleeding Neuro: Alert and oriented x3   Assessment/Plan:  Principal Problem:   Atrial fibrillation (HCC)  Possible fall versus syncope Positive orthostatic vitals Does not appear to be cardiogenic in nature.  Possibly in part from deconditioning since stroke in May.  Patient's orthostatic vitals were positive on admission.   Does report poor oral intake and approximately 70 pound weight loss and stroke.  Patient denies any dysphagia or odontophagia but just has a decreased appetite.  Patient given 500 cc of fluid.  Will recheck orthostatic vitals. -Continue telemetry -Recheck orthostatic vitals  Atrial fibrillation on Eliquis HFrEF Cardiology is planning for a Myoview tomorrow.  N.p.o. at midnight. -Continue Eliquis   History of sigmoid colon cancer Status post partial colectomy in 2018.  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  Last seen by oncology in 2018 and since has followed with PCP for serial CEA levels. -Follow-up CEA level  History of CVA Weight loss Recent left MCA territory infarct earlier this year in May.  Since this time he is lost approximately 70 pounds.  Endorses decreased appetite but denies any difficulty swallowing solids or liquids.  Low suspicion of recurrence of colon cancer.  Weight loss likely related to stroke.  Patient is also reportedly having night terrors which may be related to previous stroke.  Will continue to monitor. -Consult SLP and dietitian -PT evaluation -Continue rosuvastatin 40 mg daily  Hyperkeratotic lesion of left ear Concerning for possible squamous cell carcinoma. Recommend outpatient dermatology follow-up for possible biopsy.  Type 2 DM Home medications include Wilder Glade, patient reports not taking discontinued a week ago by PCP?  Possibly held due to low blood pressures.  Will need to clarify.  Prior to Admission Living Arrangement: Home Anticipated Discharge Location: Home Barriers to Discharge: Clinical improvement and work-up Dispo: Anticipated discharge in approximately 1 day(s).  Azie Mcconahy N, DO 01/17/2021, 2:59 PM Pager: [phone] After 5pm on weekdays and 1pm on weekends: On Call pager [phone]

## 2021-01-18 ENCOUNTER — Observation Stay (HOSPITAL_BASED_OUTPATIENT_CLINIC_OR_DEPARTMENT_OTHER): Payer: Medicare Other

## 2021-01-18 DIAGNOSIS — I4819 Other persistent atrial fibrillation: Secondary | ICD-10-CM | POA: Diagnosis not present

## 2021-01-18 DIAGNOSIS — R079 Chest pain, unspecified: Secondary | ICD-10-CM | POA: Diagnosis not present

## 2021-01-18 DIAGNOSIS — I951 Orthostatic hypotension: Secondary | ICD-10-CM | POA: Diagnosis not present

## 2021-01-18 DIAGNOSIS — E43 Unspecified severe protein-calorie malnutrition: Secondary | ICD-10-CM | POA: Insufficient documentation

## 2021-01-18 DIAGNOSIS — R4020 Unspecified coma: Secondary | ICD-10-CM

## 2021-01-18 DIAGNOSIS — R55 Syncope and collapse: Secondary | ICD-10-CM | POA: Diagnosis not present

## 2021-01-18 LAB — GLUCOSE, CAPILLARY
Glucose-Capillary: 86 mg/dL (ref 70–99)
Glucose-Capillary: 130 mg/dL — ABNORMAL HIGH (ref 70–99)
Glucose-Capillary: 120 mg/dL — ABNORMAL HIGH (ref 70–99)

## 2021-01-18 LAB — BASIC METABOLIC PANEL
Anion gap: 7 (ref 5–15)
BUN: 18 mg/dL (ref 8–23)
CO2: 27 mmol/L (ref 22–32)
Calcium: 8.7 mg/dL — ABNORMAL LOW (ref 8.9–10.3)
Chloride: 105 mmol/L (ref 98–111)
Creatinine, Ser: 1.63 mg/dL — ABNORMAL HIGH (ref 0.61–1.24)
GFR, Estimated: 42 mL/min — ABNORMAL LOW (ref >60)
Glucose, Bld: 100 mg/dL — ABNORMAL HIGH (ref 70–99)
Potassium: 3.6 mmol/L (ref 3.5–5.1)
Sodium: 139 mmol/L (ref 135–145)

## 2021-01-18 LAB — CBC WITH DIFFERENTIAL/PLATELET
Abs Immature Granulocytes: 0.04 10*3/uL (ref 0.00–0.07)
Basophils Absolute: 0.1 10*3/uL (ref 0.0–0.1)
Basophils Relative: 1 %
Eosinophils Absolute: 0.2 10*3/uL (ref 0.0–0.5)
Eosinophils Relative: 2 %
HCT: 40.7 % (ref 39.0–52.0)
Hemoglobin: 13.2 g/dL (ref 13.0–17.0)
Immature Granulocytes: 0 %
Lymphocytes Relative: 20 %
Lymphs Abs: 2 10*3/uL (ref 0.7–4.0)
MCH: 30.8 pg (ref 26.0–34.0)
MCHC: 32.4 g/dL (ref 30.0–36.0)
MCV: 94.9 fL (ref 80.0–100.0)
Monocytes Absolute: 0.8 10*3/uL (ref 0.1–1.0)
Monocytes Relative: 8 %
Neutro Abs: 7 10*3/uL (ref 1.7–7.7)
Neutrophils Relative %: 69 %
Platelets: 172 10*3/uL (ref 150–400)
RBC: 4.29 MIL/uL (ref 4.22–5.81)
RDW: 13.9 % (ref 11.5–15.5)
WBC: 10.1 10*3/uL (ref 4.0–10.5)
nRBC: 0 % (ref 0.0–0.2)

## 2021-01-18 LAB — NM MYOCAR MULTI W/SPECT W/WALL MOTION / EF: ST Depression (mm): 0 mm

## 2021-01-18 MED ORDER — LACTATED RINGERS IV SOLN
INTRAVENOUS | Status: AC
Start: 2021-01-18 — End: 2021-01-18

## 2021-01-18 MED ORDER — TECHNETIUM TC 99M TETROFOSMIN IV KIT
8.4000 | PACK | Freq: Once | INTRAVENOUS | Status: AC | PRN
  Administered 2021-01-18: 8.4 via INTRAVENOUS

## 2021-01-18 MED ORDER — TECHNETIUM TC 99M TETROFOSMIN IV KIT
25.3000 | PACK | Freq: Once | INTRAVENOUS | Status: AC | PRN
  Administered 2021-01-18: 25.3 via INTRAVENOUS

## 2021-01-18 MED ORDER — REGADENOSON 0.4 MG/5ML IV SOLN
INTRAVENOUS | Status: AC
  Administered 2021-01-18: 0.4 mg via INTRAVENOUS
  Filled 2021-01-18: qty 5

## 2021-01-18 MED ORDER — ENSURE ENLIVE PO LIQD
237.0000 mL | Freq: Two times a day (BID) | ORAL | Status: DC
  Administered 2021-01-18 (×2): 237 mL via ORAL

## 2021-01-18 NOTE — TOC Transition Note (Signed)
Transition of Care [facility]) - CM/SW Discharge Note   Patient Details  Name: Edwin Wall MRN: [id number] Date of Birth: 29-Jul-1940  [facility] CM/SW Contact:  Pollie Friar, RN Phone Number: 01/18/2021, 4:03 PM   Clinical Narrative:    Patient is discharging home with self care. No needs per PT/OT and no new DME needs.  Pt has supervision at home. Wife is responsible for his meds.  Wife is able to provide needed transportation.   Final next level of care: Home/Self Care Barriers to Discharge: No Barriers Identified   Patient Goals and CMS Choice        Discharge Placement                       Discharge Plan and Services                                     Social Determinants of Health (SDOH) Interventions     Readmission Risk Interventions No flowsheet data found.

## 2021-01-18 NOTE — Progress Notes (Signed)
OT Cancellation Note  Patient Details Name: Edwin Wall MRN: [id number] DOB: 07/15/1940   Cancelled Treatment:    Reason Eval/Treat Not Completed: Patient declined as this Probation officer entered room during lunch meal. OT to check back as time allows.   Gloris Manchester OTR/L Supplemental OT, Department of rehab services [phone]  [name]. 01/18/2021, 12:55 PM

## 2021-01-18 NOTE — Progress Notes (Signed)
Initial Nutrition Assessment  DOCUMENTATION CODES:   Severe malnutrition in context of chronic illness  INTERVENTION:  - Continue Ensure Enlive po BID, each supplement provides 350 kcal and 20 grams of protein  - Recommend diet liberalization from heart healthy to carb modified to provide more options for PO intake, spoke with MD who agrees  NUTRITION DIAGNOSIS:   Severe Malnutrition related to chronic illness (CVA) as evidenced by energy intake < or equal to 75% for > or equal to 1 month, percent weight loss, mild fat depletion, moderate muscle depletion.  GOAL:   Patient will meet greater than or equal to 90% of their needs  MONITOR:   PO intake, Supplement acceptance, Labs, Weight trends  REASON FOR ASSESSMENT:   Consult Assessment of nutrition requirement/status  ASSESSMENT:   Pt admitted after syncopal episode, noted to be secondary to deconditioning since stroke in May 2022. 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  Per cardiology, pt had nuclear study performed this morning.  Pt reports typically eating only breakfast and dinner and occasionally a small snack throughout the day. He usually eats eggs, bacon or sausage, toast and coffee for breakfast and a meat like chicken and vegetables for dinner. He sometimes enjoys a Pepsi as well. Since his stroke in May his intake has decreased d/t poor appetite, prior to his stroke he was eating 3 meals per day. He denies any difficulty chewing or swallowing. SLP following, noted plans for bedside swallow evaluation. Pt is willing to receive Ensure during admission to help with nutritional adequacy.  He reports weighing around 210 lbs but endorses a 70 lbs weight loss since May d/t his stroke. Per review of chart noted 12% weight loss in 6 months, from May until this admission, which is significant for the time frame.  Medications:  reviewed Labs: Cr 1.63 CBG's: 86-111 x12 hours   NUTRITION - FOCUSED PHYSICAL EXAM:  Flowsheet Row Most Recent Value  Orbital Region Mild depletion  Upper Arm Region No depletion  Thoracic and Lumbar Region No depletion  Buccal Region Mild depletion  Temple Region Mild depletion  Clavicle Bone Region Mild depletion  Clavicle and Acromion Bone Region No depletion  Scapular Bone Region No depletion  Dorsal Hand Moderate depletion  Patellar Region Moderate depletion  Anterior Thigh Region Moderate depletion  Posterior Calf Region Moderate depletion  Edema (RD Assessment) Moderate  [BLE]  Hair Reviewed  Eyes Reviewed  Mouth Reviewed  Skin Reviewed  Nails Reviewed       Diet Order:   Diet Order             Diet Heart Room service appropriate? Yes; Fluid consistency: Thin  Diet effective now                   EDUCATION NEEDS:   Education needs have been addressed  Skin:  Skin Assessment: Reviewed RN Assessment  Last BM:  01/16/21  Height:   Ht Readings from Last 1 Encounters:  01/16/21 5\' 8"  (1.727 m)    Weight:   Wt Readings from Last 1 Encounters:  01/16/21 91.6 kg    BMI:  Body mass index is 30.71 kg/m.  Estimated Nutritional Needs:   Kcal:  2000-2200  Protein:  100-110g  Fluid:  >2.0L  Clayborne Dana, RDN, LDN Clinical Nutrition

## 2021-01-18 NOTE — Progress Notes (Signed)
SLP Cancellation Note  Patient Details Name: Edwin Wall MRN: [id number] DOB: 10-23-1940   Cancelled treatment:       Reason Eval/Treat Not Completed: Patient at procedure or test/unavailable. SLP to f/u for completion of bedside swallow eval.    Ellwood Dense, [place], [place]ian Office Number: [place] 01/18/2021, 8:36 AM

## 2021-01-18 NOTE — Care Management Obs Status (Signed)
[facility] NOTIFICATION   Patient Details  Name: Edwin Wall MRN: [id number] Date of Birth: January 29, 1941   Medicare Observation Status Notification Given:  Yes    Pollie Friar, RN 01/18/2021, 2:33 PM

## 2021-01-18 NOTE — Discharge Instructions (Addendum)
You were admitted for a possible fall or episode of passing out. We think this was due to low blood pressure related to poor appetite and poor fluid intake. While you were here we also performed a nuclear medicine scan of your heart. This showed the possiblity that you have previously had a heart attack and resulting effects on your heart's pump functioning. You will need to follow up as an outpatient with Dr. Radford Pax for this and to get a 14-day heart monitor. Please follow up with your PCP within 1 week.   Please follow up with a urologist as an outpatient for your episodes of incontinence.

## 2021-01-18 NOTE — Progress Notes (Signed)
Physical Therapy Treatment Patient Details Name: Edwin Wall MRN: [id number] DOB: [birth year] Today's Date: 01/18/2021   History of Present Illness Pt is an 80 y/o male admitted 11/16 secondary to decreased responsiveness. Pt also found to be in a fib. PMH includes colon cancer, CVA with R deficits, HTN, gout, and DM.    PT Comments    Patient progressing to hallway ambulation this session and with only one episode needing assist for safety with walker on turns.  Patient able to sit up EOB and stand from bed unassisted and was S with RW with ambulation except for turns.  Spouse present and feels they have needed equipment and supervision for d/c.  They have recently been to outpatient PT.  Feel pt stable for home with support without follow up PT at this time.  PT to follow up acutely until d/c.    Recommendations for follow up therapy are one component of a multi-disciplinary discharge planning process, led by the attending physician.  Recommendations may be updated based on patient status, additional functional criteria and insurance authorization.  Follow Up Recommendations  No PT follow up     Assistance Recommended at Discharge    Equipment Recommendations  None recommended by PT    Recommendations for Other Services       Precautions / Restrictions Precautions Precautions: Fall     Mobility  Bed Mobility Overal bed mobility: Modified Independent                  Transfers Overall transfer level: Modified independent                      Ambulation/Gait Ambulation/Gait assistance: Supervision;Min guard Gait Distance (Feet): 200 Feet Assistive device: Rolling walker (2 wheels) Gait Pattern/deviations: Step-through pattern;Decreased stride length       General Gait Details: good pace, some difficulty with walker management on turns, but no more than minguard and cues needed and wife informed to assist   Stairs              Wheelchair Mobility    Modified Rankin (Stroke Patients Only)       Balance     Sitting balance-Leahy Scale: Good       Standing balance-Leahy Scale: Fair Standing balance comment: standing prior to sitting static no UE support                            Cognition Arousal/Alertness: Awake/alert Behavior During Therapy: WFL for tasks assessed/performed Overall Cognitive Status: History of cognitive impairments - at baseline                                 General Comments: spouse present and reports no changes        Exercises      General Comments General comments (skin integrity, edema, etc.): spouse present and reports he did not use device prior to admission, but pt relates he used whatever he felt he needed.  They have all equipment at home      Pertinent Vitals/Pain Pain Assessment: No/denies pain    Home Living                          Prior Function            PT Goals (current goals can now  be found in the care plan section) Progress towards PT goals: Progressing toward goals    Frequency    Min 3X/week      PT Plan Discharge plan needs to be updated    Co-evaluation              AM-PAC PT "6 Clicks" Mobility   Outcome Measure  Help needed turning from your back to your side while in a flat bed without using bedrails?: None Help needed moving from lying on your back to sitting on the side of a flat bed without using bedrails?: None Help needed moving to and from a bed to a chair (including a wheelchair)?: None Help needed standing up from a chair using your arms (e.g., wheelchair or bedside chair)?: None Help needed to walk in hospital room?: A Little Help needed climbing 3-5 steps with a railing? : A Little 6 Click Score: 22    End of Session   Activity Tolerance: Patient tolerated treatment well Patient left: in bed;with call bell/phone within reach;with family/visitor present   PT  Visit Diagnosis: Difficulty in walking, not elsewhere classified (R26.2)     Time: 7416-3845 PT Time Calculation (min) (ACUTE ONLY): 21 min  Charges:  $Gait Training: 8-22 mins                     Magda Kiel, PT Acute Rehabilitation Services Pager:[phone] [phone] 01/18/2021    Reginia Naas 01/18/2021, 4:01 PM

## 2021-01-18 NOTE — Progress Notes (Signed)
Subjective:  For nuclear study this am   Objective:  Vitals:   01/17/21 2327 01/18/21 0347 01/18/21 0740 01/18/21 0917  BP: (!) 132/95 (!) 141/96 (!) 143/89 (!) 142/93  Pulse: 87 91 72 73  Resp: 17 16 20    Temp: 98.8 F (37.1 C) 98 F (36.7 C) 98.9 F (37.2 C)   TempSrc: Oral  Oral   SpO2: 99% 95% 97%   Weight:      Height:        Intake/Output from previous day: No intake or output data in the 24 hours ending 01/18/21 8144  Physical Exam:  Elderly male Speech ok Lungs clear No murmur  Post sigmoid colectomy No edema Mild RUE weakness  Lab Results: Basic Metabolic Panel: Recent Labs    01/16/21 2210 01/17/21 0326 01/18/21 0638  NA 137 137 139  K 3.7 3.6 3.6  CL 104 103 105  CO2 19* 24 27  GLUCOSE 177* 140* 100*  BUN 16 17 18   CREATININE 1.67* 1.46* 1.63*  CALCIUM 9.1 8.9 8.7*  MG 1.9 2.1  --   PHOS  --  3.5  --    Liver Function Tests: Recent Labs    01/16/21 2210  AST 26  ALT 17  ALKPHOS 90  BILITOT 1.7*  PROT 5.7*  ALBUMIN 2.7*   No results for input(s): LIPASE, AMYLASE in the last 72 hours. CBC: Recent Labs    01/16/21 2210 01/18/21 0638  WBC 13.7* 10.1  NEUTROABS 12.2* 7.0  HGB 14.7 13.2  HCT 45.5 40.7  MCV 95.6 94.9  PLT 197 172    Thyroid Function Tests: Recent Labs    01/17/21 0326  TSH 1.329   Anemia Panel: No results for input(s): VITAMINB12, FOLATE, FERRITIN, TIBC, IRON, RETICCTPCT in the last 72 hours.  Imaging: CT Head Wo Contrast  Result Date: 01/16/2021 CLINICAL DATA:  Syncope. EXAM: CT HEAD WITHOUT CONTRAST TECHNIQUE: Contiguous axial images were obtained from the base of the skull through the vertex without intravenous contrast. COMPARISON:  None. FINDINGS: Brain: There is mild cerebral atrophy with widening of the extra-axial spaces and ventricular dilatation. There are areas of decreased attenuation within the white matter tracts of the supratentorial brain, consistent with microvascular disease changes. A  stable area of cortical encephalomalacia, with adjacent chronic white matter low attenuation, is seen within the left frontal parietal region. Small chronic bilateral basal ganglia lacunar infarcts are also present. Vascular: No hyperdense vessel or unexpected calcification. Skull: Normal. Negative for fracture or focal lesion. Sinuses/Orbits: There is mild to moderate severity sphenoid sinus mucosal thickening. Other: None. IMPRESSION: 1. Generalized cerebral atrophy. 2. Chronic left frontal parietal lobe infarct. 3. Small chronic bilateral basal ganglia lacunar infarcts. 4. No acute intracranial abnormality. Electronically Signed   By: Virgina Norfolk M.D.   On: 01/16/2021 23:07   DG Chest Portable 1 View  Result Date: 01/16/2021 CLINICAL DATA:  Syncope. EXAM: PORTABLE CHEST 1 VIEW COMPARISON:  December 23, 2020 FINDINGS: There is no evidence of acute infiltrate, pleural effusion or pneumothorax. Mild patient rotation is noted. The heart size and mediastinal contours are within normal limits. Degenerative changes are seen within the thoracic spine. IMPRESSION: No active disease. Electronically Signed   By: Virgina Norfolk M.D.   On: 01/16/2021 22:27    Cardiac Studies:  ECG: Afib RBBB / LAFB no acute changes    Telemetry: afib no long pauses   Echo: 12/24/20 EF 45-50% mild MR   Medications:    apixaban  2.5 mg Oral BID   feeding supplement  237 mL Oral BID BM   metoprolol tartrate  5 mg Intravenous Once   regadenoson       regadenoson  0.4 mg Intravenous Once   rosuvastatin  40 mg Oral Daily      Assessment/Plan:  Syncope:  seems more like weakness with difficult to arouse MS changes rather than loss of consciousness. Troponin 21 no chest pain BNP only 235 Hct 45.5 TSH normal BUN 17 Cr 1.46 not azotemic. Suspect issues revolve around weight loss, CVA, cancer and age not arrhythmia Discussed with Dr Quentin Ore who thought 14 day monitor on d/c sufficient and not ILR  CT head with old  infarct no acute findings  Consider EEG given MS changes and stroke substrate  Low EF:  ECG with no obvious infarct Apical RWMA on echo EF 45-50%  CXR no active disease Myovue this am Was scheduled to have yesterday at [facility] street Troponin negative and no chest pain Colon Cancer not on adjuvant Rx post colectomy  CVA:  from afib on eliquis needs continued PT/OT   Jenkins Rouge 01/18/2021, 9:22 AM

## 2021-01-18 NOTE — Discharge Summary (Addendum)
Name: Edwin Wall MRN: [id number] DOB: 1940/05/12 80 y.o. PCP: [name], MD  Date of Admission: 01/16/2021  9:46 PM Date of Discharge: 01/18/2021 Attending Physician: Velna Ochs, MD  Discharge Diagnosis: 1. Possible fall v syncopal episode 2. Atrial fibrillation on Eliquis 3. HFrEF 4. Sigmoid colon cancer 5. CVA 6. Hyperkeratotic lesion of L ear 7. T2DM 8. Incontinence    Discharge Medications: Allergies as of 01/18/2021   No Known Allergies      Medication List     STOP taking these medications    diclofenac 75 MG EC tablet Commonly known as: VOLTAREN       TAKE these medications    amantadine 100 MG capsule Commonly known as: SYMMETREL Take 2 capsules (200 mg total) by mouth daily. For aphasia What changed:  how much to take when to take this additional instructions   apixaban 2.5 MG Tabs tablet Commonly known as: ELIQUIS Take 1 tablet (2.5 mg total) by mouth 2 (two) times daily.   colestipol 1 g tablet Commonly known as: COLESTID Take 1 tablet (1 g total) by mouth 2 (two) times daily. What changed: when to take this   Farxiga 10 MG Tabs tablet Generic drug: dapagliflozin propanediol Take 1 tablet (10 mg total) by mouth daily.   LORazepam 1 MG tablet Commonly known as: ATIVAN Take 1 tablet (1 mg total) by mouth daily as needed for anxiety.   rosuvastatin 40 MG tablet Commonly known as: CRESTOR Take 1 tablet (40 mg total) by mouth daily.   Senexon-S 8.6-50 MG tablet Generic drug: senna-docusate Take 1 tablet by mouth 2 (two) times daily. What changed:  when to take this reasons to take this        Disposition and follow-up:   Mr.Jordon K Millhouse was discharged from [facility] in Stable condition.  At the hospital follow up visit please address:  1.  Ear lesion, which requires dermatology follow up  2. Consider discontinuation of Ativan given risk in elderly patients.   2.  Labs /  imaging needed at time of follow-up: none  3.  Pending labs/ test needing follow-up: CEA for cancer surveillance of sigmoid colon  Follow-up Appointments: See follow up providers tab  Hospital Course by problem list: 1. Possible fall v syncopal episode Patient was brought into the ED and wife reported that patient had gone to the bathroom and that she heard a thud. There was concern that he was more confused than normal, but his wife confirmed on interview that he is at his baseline mental status (alert and oriented to person). Patient with markedly positive orthostatic vitals on admission. The patient and his wife report significant weight loss and decreased PO intake since his stroke in May of this year. We have a low suspicion for cardiogenic contribution to syncope given unremarkable EKG and telemetry, and lack of valvular/major structural disease on echo. The patient was given a 500 mL infusion of LR and orthostatic vitals were rechecked and were found to be normal.   2. Atrial fibrillation on Eliquis, HFrEF The patient was supposed to have a myoview lexiscan the day after admission. We performed this in the hospital with the scan showing: "Abnormal, intermediate risk stress nuclear study with partially reversible inferior defect suggestive of prior infarct and mild peri-infarct ischemia; gated ejection fraction 36% with global hypokinesis but may be inaccurate as patient is in atrial fibrillation; mild left ventricular enlargement." Cardiology saw and assessed the patient and they felt  the patient was appropriate for a 14 day monitor outpatient and follow up with Dr. Radford Pax, his cardiologist.   3. History of CVA, poor appetite As above, patient with lack of appetite since stroke in May. RD consult obtained and recommended a liberal diet to encourage PO as well as ensure.   4. History of sigmoid colon cancer Status post partial colectomy in 2018.  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  Last seen by oncology in 2018 and since has followed with PCP for serial CEA levels. CEA level pending at D/C.   5. Hyperkeratotic lesion of left ear Concern for SCC vs melanoma. Recommend outpatient dermatology follow up.   6. T2DM CBGs within the 100's during admission. Wilder Glade held initially, but patient told to resume on discharge.   7. Incontinence  On the day of discharge the patient reported that he has periodic episodes of incontinence. This only started after his stroke in May. It was recommended that he follow up with urology as an outpatient.     Discharge Exam:   BP (!) 131/98 (BP Location: Left Arm)   Pulse 80   Temp 98.1 F (36.7 C) (Oral)   Resp 20   Ht 5\' 8"  (1.727 m)   Wt 91.6 kg   SpO2 93%   BMI 30.71 kg/m  Discharge exam:  Physical Exam Vitals reviewed.  Cardiovascular:     Rate and Rhythm: Normal rate. Rhythm irregular.     Heart sounds: No murmur heard.    Comments: Lower extremity edema R>L, 2+ on R side Pulmonary:     Effort: Pulmonary effort is normal.     Breath sounds: Normal breath sounds.  Abdominal:     General: Bowel sounds are normal.     Palpations: Abdomen is soft.     Tenderness: There is no abdominal tenderness.  Neurological:     Mental Status: He is alert.         Pertinent Labs, Studies, and Procedures:  Myoview as above, orthostatic vitals as above  Discharge Instructions: Discharge Instructions     Increase activity slowly   Complete by: As directed      You were admitted for a possible fall or episode of passing out. We think this was due to low blood pressure related to poor appetite and poor fluid intake. While you were here we also performed a nuclear medicine scan of your heart. This showed the possibility that you have previously had a heart attack and resulting effects on your heart's pump functioning. You will need to follow up as an outpatient with Dr. Radford Pax for this and to have a  14-day heart monitor.   Signed: Corky Sox, MD 01/18/2021, 4:00 PM

## 2021-01-19 LAB — CEA: CEA: 2.2 ng/mL (ref 0.0–4.7)

## 2021-01-22 ENCOUNTER — Ambulatory Visit (HOSPITAL_COMMUNITY): Payer: Medicare Other | Admitting: Physician Assistant

## 2021-01-22 ENCOUNTER — Telehealth: Payer: Self-pay

## 2021-01-22 DIAGNOSIS — I4819 Other persistent atrial fibrillation: Secondary | ICD-10-CM

## 2021-01-22 DIAGNOSIS — I10 Essential (primary) hypertension: Secondary | ICD-10-CM

## 2021-01-22 NOTE — Telephone Encounter (Signed)
Patient had abnormal stress test while in the hospital. Per Dr. Radford Pax patient needs a left heart cath prior cardioversion. Patient's appointment with [facility] Fib clinic will be cancelled today. I have spoken with the patient's wife and she is aware. Cath scheduled for 12/01 at 10:30am with Dr. [place]. I will print out instructions for patient to come pick up. He will come next week for lab work.

## 2021-01-28 ENCOUNTER — Other Ambulatory Visit: Payer: Medicare Other | Admitting: *Deleted

## 2021-01-28 ENCOUNTER — Other Ambulatory Visit: Payer: Self-pay

## 2021-01-28 DIAGNOSIS — I10 Essential (primary) hypertension: Secondary | ICD-10-CM

## 2021-01-28 DIAGNOSIS — I4819 Other persistent atrial fibrillation: Secondary | ICD-10-CM

## 2021-01-28 NOTE — Telephone Encounter (Signed)
Shared Decision Making/Informed Consent The risks [stroke (1 in 1000), death (1 in 1000), kidney failure [usually temporary] (1 in 500), bleeding (1 in 200), allergic reaction [possibly serious] (1 in 200)], benefits (diagnostic support and management of coronary artery disease) and alternatives of a cardiac catheterization were discussed in detail with Edwin Wall and he is willing to proceed.

## 2021-01-29 ENCOUNTER — Telehealth: Payer: Self-pay | Admitting: *Deleted

## 2021-01-29 LAB — BASIC METABOLIC PANEL
BUN/Creatinine Ratio: 9 — ABNORMAL LOW (ref 10–24)
BUN: 15 mg/dL (ref 8–27)
CO2: 23 mmol/L (ref 20–29)
Calcium: 9.4 mg/dL (ref 8.6–10.2)
Chloride: 102 mmol/L (ref 96–106)
Creatinine, Ser: 1.64 mg/dL — ABNORMAL HIGH (ref 0.76–1.27)
Glucose: 141 mg/dL — ABNORMAL HIGH (ref 70–99)
Potassium: 3.7 mmol/L (ref 3.5–5.2)
Sodium: 143 mmol/L (ref 134–144)
eGFR: 42 mL/min/{1.73_m2} — ABNORMAL LOW (ref >59)

## 2021-01-29 LAB — CBC
Hematocrit: 43.2 % (ref 37.5–51.0)
Hemoglobin: 14.4 g/dL (ref 13.0–17.7)
MCH: 30.7 pg (ref 26.6–33.0)
MCHC: 33.3 g/dL (ref 31.5–35.7)
MCV: 92 fL (ref 79–97)
Platelets: 215 10*3/uL (ref 150–450)
RBC: 4.69 x10E6/uL (ref 4.14–5.80)
RDW: 12.2 % (ref 11.6–15.4)
WBC: 8.7 10*3/uL (ref 3.4–10.8)

## 2021-01-29 NOTE — Telephone Encounter (Signed)
Cardiac catheterization scheduled at [facility] for: Thursday January 31, 2021 10:30 AM [facility] Main Entrance [facility]) at: 5:30 AM-pre-procedure hydration per protocol-GFR 42   No solid food after midnight prior to cath, clear liquids until 5 AM day of procedure.  Medication instructions: Hold: Eliquis-last dose AM 01/29/21 until post procedure. Patient's wife reports patient not currently taking [place].   Except hold medications usual morning medications can be taken pre-cath with sips of water including aspirin 81 mg.    Confirmed patient has responsible adult to drive home post procedure and be with patient first 24 hours after arriving home.  [facility] does allow one visitor to accompany you and wait in the hospital waiting room while you are there for your procedure. You and your visitor will be asked to wear a mask once you enter the hospital.   Patient reports does not currently have any new symptoms concerning for COVID-19 and no household members with COVID-19 like illness.        Reviewed procedure/mask/visitor instructions with patient's wife (DPR), [place].

## 2021-01-31 ENCOUNTER — Ambulatory Visit (HOSPITAL_COMMUNITY): Admission: RE | Admit: 2021-01-31 | Payer: Medicare Other | Source: Home / Self Care | Admitting: Cardiology

## 2021-01-31 ENCOUNTER — Telehealth: Payer: Self-pay | Admitting: Cardiology

## 2021-01-31 ENCOUNTER — Encounter (HOSPITAL_COMMUNITY): Admission: RE | Payer: Self-pay | Source: Home / Self Care

## 2021-01-31 ENCOUNTER — Encounter: Payer: Self-pay | Admitting: Psychiatry

## 2021-01-31 ENCOUNTER — Ambulatory Visit: Payer: Medicare Other | Admitting: Adult Health

## 2021-01-31 SURGERY — LEFT HEART CATH AND CORONARY ANGIOGRAPHY
Anesthesia: LOCAL

## 2021-01-31 NOTE — Telephone Encounter (Signed)
I spoke with patient's wife (DPR)-she would like to hold off rescheduling cath until first of next week to see how she is feeling then. I will plan to follow up with patient's wife on Monday 02/04/21 to see how she is feeling and discuss rescheduling cath. Patient will restart Eliquis that had been on hold for cath today.

## 2021-01-31 NOTE — Progress Notes (Deleted)
[facility] [address] [place]. [place] [address] [phone]       HOSPITAL FOLLOW UP NOTE  [name] Date of Birth:  [birth year] Medical Record Number:  [id number]   Reason for Referral:  hospital stroke follow up    SUBJECTIVE:   CHIEF COMPLAINT:  No chief complaint on file.   HPI:   Edwin Wall is a 80M with preDM2, HTN, OA, anxiety, who presented on 07/16/2018 with speech difficulty and R facial droop upon awakening. NIHSS 9.  Personally reviewed hospitalization pertinent progress notes, lab work and imaging summary provided.  Evaluated by Dr. 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 Fib (noted in ED).  EF 50 to 55%.  Aspirin initiated and recommended initiating Eliquis 5 to 7 days poststroke for CHA2DS2-VASc score of 5.  LDL 127 -initiated Crestor 40 mg daily.  Uncontrolled DM with A1c 7.1.  HTN stable.  No prior stroke history.  Evaluated by therapies and recommended discharge to CIR. CIR from 5/18 - 6/3.   Today, 10/02/2020, Edwin Wall is being seen for hospital follow-up accompanied by his wife.  Reports residual speech difficulty with continued improvement since discharge.  He is currently working with SLP.  He denies any right-sided weakness or gait difficulty.  Ambulates without assistive device and denies any recent falls.  Denies new stroke/TIA symptoms.  Reports compliance on Eliquis and Crestor tolerating without side effects.  Blood pressure today 114/94 -does not routinely monitor at home.  He has since had follow-up with PCP since discharge.  He does not have an established cardiologist.  No further concerns at this time.     ROS:   14 system review of systems performed and negative with exception of those listed in HPI  PMH:  Past Medical History:  Diagnosis Date   Anxiety    Arthritis    Cataract    Colon cancer ([place]) dx'd 11/2016   "stage 3"   Gout    Gout     Hemorrhoids    History of kidney stones    "passed it"   Hypertension    Pre-diabetes     PSH:  Past Surgical History:  Procedure Laterality Date   COLECTOMY  12/17/2016   lap; partial sigmoid colectomy/notes 12/17/2016   COLONOSCOPY W/ BIOPSIES AND POLYPECTOMY  11/11/2016   LAPAROSCOPIC SIGMOID COLECTOMY [name] 12/17/2016   Procedure: LAPAROSCOPIC SIGMOID COLECTOMY;  Surgeon: [name], MD;  Location: [facility];  Service: General;  Laterality: [name];   NASAL SEPTUM SURGERY     TONSILLECTOMY      Social History:  Social History   Socioeconomic History   Marital status: Married    Spouse name: Not on file   Number of children: Not on file   Years of education: Not on file   Highest education level: Not on file  Occupational History   Not on file  Tobacco Use   Smoking status: Never   Smokeless tobacco: Never  Vaping Use   Vaping Use: Never used  Substance and Sexual Activity   Alcohol use: No   Drug use: No   Sexual activity: Never  Other Topics Concern   Not on file  Social History Narrative   Not on file   Social Determinants of Health   Financial Resource Strain: Not on file  Food Insecurity: Not on file  Transportation Needs: Not on file  Physical Activity: Not on file  Stress:  Not on file  Social Connections: Not on file  Intimate Partner Violence: Not on file    Family History:  Family History  Problem Relation Age of Onset   Cancer Mother        LUNG   COPD Father     Medications:   Current Outpatient Medications on File Prior to Visit  Medication Sig Dispense Refill   amantadine (SYMMETREL) 100 MG capsule Take 2 capsules (200 mg total) by mouth daily. For aphasia (Patient taking differently: Take 100 mg by mouth 2 (two) times daily.) 60 capsule 5   apixaban (ELIQUIS) 2.5 MG TABS tablet Take 1 tablet (2.5 mg total) by mouth 2 (two) times daily. 180 tablet 3   colestipol (COLESTID) 1 g tablet Take 1 tablet (1 g total) by mouth 2 (two) times daily.  (Patient taking differently: Take 1 g by mouth daily.) 30 tablet 0   dapagliflozin propanediol (FARXIGA) 10 MG TABS tablet Take 1 tablet (10 mg total) by mouth daily. (Patient not taking: Reported on 01/17/2021) 30 tablet 0   LORazepam (ATIVAN) 1 MG tablet Take 1 tablet (1 mg total) by mouth daily as needed for anxiety. 30 tablet    rosuvastatin (CRESTOR) 40 MG tablet Take 1 tablet (40 mg total) by mouth daily. 30 tablet 0   senna-docusate (SENOKOT-S) 8.6-50 MG tablet Take 1 tablet by mouth 2 (two) times daily. (Patient taking differently: Take 1 tablet by mouth daily as needed for mild constipation.) 60 tablet 0   No current facility-administered medications on file prior to visit.    Allergies:  No Known Allergies    OBJECTIVE:  Physical Exam  There were no vitals filed for this visit.  There is no height or weight on file to calculate BMI. No results found.  Post stroke PHQ 2/9 Depression screen PHQ 2/9 09/10/2020  Decreased Interest 0  Down, Depressed, Hopeless 0  PHQ - 2 Score 0  Altered sleeping 0  Tired, decreased energy 0  Change in appetite 0  Feeling bad or failure about yourself  1  Trouble concentrating 0  Moving slowly or fidgety/restless 1  Suicidal thoughts 0  PHQ-9 Score 2  Difficult doing work/chores Not difficult at all     General: well developed, well nourished, very pleasant elderly Caucasian male, seated, in no evident distress Head: head normocephalic and atraumatic.   Neck: supple with no carotid or supraclavicular bruits Cardiovascular: irregular rate and rhythm, no murmurs Musculoskeletal: no deformity Skin:  no rash/petichiae Vascular:  Normal pulses all extremities   Neurologic Exam Mental Status: Awake and fully alert.  Mild expressive aphasia.  Unable to appreciate receptive aphasia.  Able to follow commands without difficulty.  Oriented to place and time. Recent and remote memory intact. Attention span, concentration and fund of knowledge  appropriate. Mood and affect appropriate.  Cranial Nerves: Fundoscopic exam reveals sharp disc margins. Pupils equal, briskly reactive to light. Extraocular movements full without nystagmus. Visual fields full to confrontation. Hearing intact. Facial sensation intact. Face, tongue, palate moves normally and symmetrically.  Motor: Normal bulk and tone. Normal strength in all tested extremity muscles except slight decrease right hand dexterity and hip flexor weakness Sensory.: intact to touch , pinprick , position and vibratory sensation.  Coordination: Rapid alternating movements normal in all extremities except slightly decreased right hand. Finger-to-nose and heel-to-shin performed accurately bilaterally.  Mildly orbits left arm over right arm. Gait and Station: Arises from chair without difficulty. Stance is normal. Gait demonstrates normal stride length  and mild unsteadiness without use of assistive device.  Reflexes: 1+ and symmetric. Toes downgoing.     NIHSS  1 Modified Rankin  2      ASSESSMENT: Edwin Wall is a 80 y.o. year old male L MCA stroke in setting of left M2 occlusion on 07/15/2020 likely from embolic source in setting of newly diagnosed A. fib noted in ED. Vascular risk factors include newly diagnosed A. fib, HTN, HLD, DM, advanced age, obesity and high risk for sleep apnea.      PLAN:  L MCA stroke :  Residual deficit: Mild expressive aphasia and right hemiparesis -continue to work with therapies for likely ongoing recovery.  Continue Eliquis (apixaban) daily  and Crestor 40 mg daily for secondary stroke prevention.   Discussed secondary stroke prevention measures and importance of close PCP follow up for aggressive stroke risk factor management. I have gone over the pathophysiology of stroke, warning signs and symptoms, risk factors and their management in some detail with instructions to go to the closest emergency room for symptoms of concern. Atrial  fibrillation, new dx: On Eliquis 5 mg twice daily for CHA2DS2-VASc score 5.  Currently in atrial fibrillation although asymptomatic.  Referral placed to cardiology to establish care.  Does endorse mild snoring but no other associated sleep apnea symptoms nor is patient interested in pursuing sleep study HLD: LDL goal <70. Recent LDL 127 -continue Crestor 40 mg daily -request follow-up with PCP in the next 1 to 2 months for repeat lipid panel ongoing prescribing of statin.  DMII: A1c goal<7.0. Recent A1c 7.1 routinely monitored by PCP.      Follow up in 4 months or call earlier if needed   CC:  [facility] provider: Dr. Leonie Man PCP: [name], MD    I spent 46 minutes of face-to-face and non-face-to-face time with patient and wife.  This included previsit chart review including review of recent hospitalization, lab review, study review, order entry, electronic health record documentation, patient education regarding recent stroke and etiology, secondary stroke prevention measures and importance of managing stroke risk factors, residual stroke deficits and likely further recovery and answered all other questions to patient and wife's satisfaction  Frann Rider, AGNP-BC  [facility] [address] [place], [place] [address]  Phone [phone] Fax [phone] Note: This document was prepared with digital dictation and possible smart phrase technology. Any transcriptional errors that result from this process are unintentional.

## 2021-01-31 NOTE — Telephone Encounter (Signed)
New Message:   Wife called and wanted Dr Radford Pax and her nurse to know that the patient had to cx his procedure at the  hospital today. (01-31-21). Wife said she is sick and could not bring him and patient is unable to drive. She said the hospital is already aware of this.

## 2021-02-04 NOTE — Telephone Encounter (Signed)
I spoke with patient's wife (DPR) this morning, she is still not feeling well. She states she and patient have discussed timing of rescheduling cath, and both agree they would like to wait until after first of year to reschedule. Patient's wife aware ,if wait until after first of year, since last office visit will be more than 30 days ago, patient will need office visit, BMP/CBC/EKG. Patient's wife aware I have scheduled an appointment for patient with Laurann Montana, NP at the [facility] Wednesday March 06, 2021 2:45 PM to discuss rescheduling cath/BMP/CBC/EKG.  Patient will most likely need to be scheduled for pre-procedure hydration (arrive 5 hours ahead of cath time) prior to cath since most recent GFRs have been <45. Patient's wife reports patient has not had any change in cardiac symptoms, knows to call our office for any changes.  I did confirm with patient's wife that patient is currently taking Eliquis at previous dose, knows to continue for now.

## 2021-02-15 NOTE — Telephone Encounter (Signed)
Await cath results

## 2021-03-06 ENCOUNTER — Ambulatory Visit (INDEPENDENT_AMBULATORY_CARE_PROVIDER_SITE_OTHER): Payer: Medicare Other | Admitting: Nurse Practitioner

## 2021-03-06 ENCOUNTER — Other Ambulatory Visit: Payer: Self-pay

## 2021-03-06 VITALS — BP 112/76 | HR 89 | Ht 68.0 in | Wt 191.4 lb

## 2021-03-06 DIAGNOSIS — I10 Essential (primary) hypertension: Secondary | ICD-10-CM | POA: Diagnosis not present

## 2021-03-06 DIAGNOSIS — R9439 Abnormal result of other cardiovascular function study: Secondary | ICD-10-CM

## 2021-03-06 MED ORDER — FUROSEMIDE 20 MG PO TABS
20.0000 mg | ORAL_TABLET | Freq: Every day | ORAL | 3 refills | Status: DC
Start: 2021-03-06 — End: 2021-04-19

## 2021-03-06 NOTE — Patient Instructions (Addendum)
Medication Instructions:  Your physician has recommended you make the following change in your medication:   Start: lasix 20mg  daily- hold on Monday for procedure   Resume: Crestor (Rosuvastatin) 40mg  daily   *If you need a refill on your cardiac medications before your next appointment, please call your pharmacy*   Lab Work: Your physician recommends that you return for lab work today- [place], BMET  If you have labs (blood work) drawn today and your tests are completely normal, you will receive your results only by: MyChart Message (if you have [place]) OR A paper copy in the mail If you have any lab test that is abnormal or we need to change your treatment, we will call you to review the results.   Testing/Procedures: Your physician has requested that you have a cardiac catheterization. Cardiac catheterization is used to diagnose and/or treat various heart conditions. Doctors may recommend this procedure for a number of different reasons. The most common reason is to evaluate chest pain. Chest pain can be a symptom of coronary artery disease (CAD), and cardiac catheterization can show whether plaque is narrowing or blocking your hearts arteries. This procedure is also used to evaluate the valves, as well as measure the blood flow and oxygen levels in different parts of your heart. For further information please visit [email or web address]. Please follow instruction sheet, as given.    Follow-Up: At [facility], you and your health needs are our priority.  As part of our continuing mission to provide you with exceptional heart care, we have created designated Provider Care Teams.  These Care Teams include your primary Cardiologist (physician) and Advanced Practice Providers (APPs -  Physician Assistants and Nurse Practitioners) who all work together to provide you with the care you need, when you need it.  We recommend signing up for the patient portal called "MyChart".  Sign up  information is provided on this After Visit Summary.  MyChart is used to connect with patients for Virtual Visits (Telemedicine).  Patients are able to view lab/test results, encounter notes, upcoming appointments, etc.  Non-urgent messages can be sent to your provider as well.   To learn more about what you can do with MyChart, go to [email or web address].    Your next appointment:   Follow up as scheduled with Dr. Radford Pax on 03/26/21 at 1:40pm   The format for your next appointment:   In Person  Provider:   Dr. Radford Pax  If MD is not listed, click here to update    :1}    Other Instructions  You are scheduled for a Cardiac Catheterization on Monday, January 9 with Dr. Harrell Gave End.  1. Please arrive at the [facility] (Main Entrance A) at [facility]: [place], [place] [address] 9am (This time is two hours before your procedure to ensure your preparation). Free valet parking service is available.   Special note: Every effort is made to have your procedure done on time. Please understand that emergencies sometimes delay scheduled procedures.  2. Diet: Do not eat solid foods after midnight.  The patient may have clear liquids until 5am upon the day of the procedure.  3. Labs: You will need to have blood drawn on 03/06/20 at your appointment!   4. Medication instructions in preparation for your procedure:  Please take Eliquis Saturday morning dose and then  Hold Eliquis starting with evening dose on Saturday (03/10/21) and resume taking at the instruction of Dr. Saunders Revel after Cath    On  the morning of your procedure, take your Aspirin and any morning medicines NOT listed above.  You may use sips of water.  5. Plan for one night stay--bring personal belongings. 6. Bring a current list of your medications and current insurance cards. 7. You MUST have a responsible person to drive you home. 8. Someone MUST be with you the first 24 hours after you arrive home or your  discharge will be delayed. 9. Please wear clothes that are easy to get on and off and wear slip-on shoes.  Thank you for allowing [place] to care for you!   -- [facility]

## 2021-03-06 NOTE — H&P (View-Only) (Signed)
Office Visit    Patient Name: Edwin Wall Date of Encounter: 03/07/2021  PCP:  [name], MD   [place]  Cardiologist:  Fransico Him, MD  Advanced Practice Provider:  No care team member to display Electrophysiologist:  None     Chief Complaint    Edwin Wall is a 81 y.o. 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, mitral regurgitation, dilated cardiomyopathy presents today for hospital follow up   Past Medical History    Past Medical History:  Diagnosis Date   Anxiety    Arthritis    Cataract    Colon cancer ([place]) dx'd 11/2016   "stage 3"   Gout    Gout    Hemorrhoids    History of kidney stones    "passed it"   Hypertension    Pre-diabetes    Past Surgical History:  Procedure Laterality Date   COLECTOMY  12/17/2016   lap; partial sigmoid colectomy/notes 12/17/2016   COLONOSCOPY W/ BIOPSIES AND POLYPECTOMY  11/11/2016   LAPAROSCOPIC SIGMOID COLECTOMY [name] 12/17/2016   Procedure: LAPAROSCOPIC SIGMOID COLECTOMY;  Surgeon: [name], MD;  Location: [place];  Service: General;  Laterality: [name];   NASAL SEPTUM SURGERY     TONSILLECTOMY      Allergies  No Known Allergies  History of Present Illness    Edwin Wall is a 81 y.o. 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  He was noted to have new onset atrial fibrillation at the time and started on Eliquis.  Echocardiogram 07/16/2020 with low normal LVEF 50-55%, mild left atrial enlargement, mild MR.  He was referred to the atrial fibrillation clinic.  Admitted 01/16/21 after fall vs syncopal event. He had positive orthostatic vital  signs on admission. 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 Previous echo 12/24/2020 LVEF 45 to 50%, R WMA, normal RV function, LA moderately dilated, RA mildly dilated, mild MR.. Inpatient myoview was intermediate risk study. He was discharged with plan for cardiac catheterization.  Cardiac catheterization had to be delayed as his wife became ill.  Today, he is here with his wife for follow-up of abnormal stress test. He denies chest pain, shortness of breath, palpitations, melena, hematuria, hemoptysis, diaphoresis, weakness, presyncope, syncope, orthopnea, and PND.Reports fatigue and inability to complete activities in the same capacity that he could 6 months ago. Reports he is unable to get groceries in from the house without stopping to rest. Suspect this may be his angina equivalent. He also denies symptoms with atrial fibrillation but suspect increased level of fatigue could be r/t to persistent a fib. He is able to answer some questions but his wife states at other times he is very confused. She notices that confusion increases when the sun goes down. States he sleeps much more than he did prior to stroke. Wife reports she has some confusion about his medications. States PCP told them that patient could stop [place]. She also stopped giving patient his Crestor.   EKGs/Labs/Other Studies Reviewed:   The following studies were reviewed today:  Echo 12/24/20  1. Left ventricular ejection fraction, by estimation, is 45 to 50%. The  left  ventricle has mildly decreased function. The left ventricle  demonstrates regional wall motion abnormalities (see scoring  diagram/findings for description). The left ventricular   internal cavity size was mildly dilated. Left ventricular diastolic  function could not be evaluated. There is severe akinesis of the left  ventricular, apical anteroseptal wall and apical segment.   2. Right ventricular systolic function is  normal. The right ventricular  size is normal.   3. Left atrial size was moderately dilated.   4. Right atrial size was mildly dilated.   5. The mitral valve is grossly normal. Mild mitral valve regurgitation.   6. The aortic valve is normal in structure. Aortic valve regurgitation is  not visualized. No aortic stenosis is present.   Myoview 01/18/21     Findings are consistent with prior myocardial infarction with peri-infarct ischemia. The study is intermediate risk.   No ST deviation was noted.   LV perfusion is abnormal. There is evidence of ischemia. There is evidence of infarction. Defect 1: There is a large defect with severe reduction in uptake present in the apical to basal inferior location(s) that is partially reversible. There is abnormal wall motion in the defect area. Consistent with infarction and peri-infarct ischemia.   Left ventricular function is abnormal. Global function is moderately reduced. The left ventricular ejection fraction is moderately decreased (30-44%). End diastolic cavity size is mildly enlarged.   Abnormal, intermediate risk stress nuclear study with partially reversible inferior defect suggestive of prior infarct and mild peri-infarct ischemia; gated ejection fraction 36% with global hypokinesis but may be inaccurate as patient is in atrial fibrillation; mild left ventricular enlargement.   EKG:  EKG is ordered today.  The ekg ordered today demonstrates atrial fibrillation at rate of 89 bpm with RBBB and LAFB  Recent Labs: 01/16/2021: ALT 17; B Natriuretic Peptide 235.2 01/17/2021: Magnesium 2.1; TSH 1.329 03/06/2021: BUN 19; Creatinine, Ser 1.43; Hemoglobin 14.8; Platelets 249; Potassium 4.5; Sodium 144  Recent Lipid Panel    Component Value Date/Time   CHOL 179 07/16/2020 0337   TRIG 92 07/16/2020 0337   HDL 34 (L) 07/16/2020 0337   CHOLHDL 5.3 07/16/2020 0337   VLDL 18 07/16/2020 0337   LDLCALC 127 (H) 07/16/2020 0337   Home Medications    Current Meds  Medication Sig   apixaban (ELIQUIS) 2.5 MG TABS tablet Take 1 tablet (2.5 mg total) by mouth 2 (two) times daily.   colestipol (COLESTID) 1 g tablet Take 1 tablet (1 g total) by mouth 2 (two) times daily. (Patient taking differently: Take 1 g by mouth daily.)   furosemide (LASIX) 20 MG tablet Take 1 tablet (20 mg total) by mouth daily.   senna-docusate (SENOKOT-S) 8.6-50 MG tablet Take 1 tablet by mouth 2 (two) times daily. (Patient taking differently: Take 1 tablet by mouth daily as needed for mild constipation.)    Review of Systems       All other systems reviewed and are otherwise negative except as noted above.  Physical Exam    VS:  BP 112/76 (BP Location: Left Arm, Patient Position: Sitting, Cuff Size: Large)    Pulse 89    Ht 5\' 8"  (1.727 m)    Wt 191 lb 6.4 oz (86.8 kg)    BMI 29.10 kg/m  , BMI Body mass index is 29.1 kg/m.  Wt Readings from Last 3 Encounters:  03/06/21 191 lb 6.4 oz (86.8 kg)  01/16/21 202 lb (91.6 kg)  01/11/21 202 lb 9.6 oz (91.9 kg)  GEN: Well nourished, well developed elderly gentleman in no acute distress. HEENT: normal. Neck: Supple, no JVD, carotid bruits, or masses. Cardiac: Irregular RR, no murmurs, rubs, or gallops. No clubbing, cyanosis, edema.  Radials/PT 2+ and equal bilaterally.  Respiratory:  Respirations regular and unlabored, clear to auscultation bilaterally. GI: Soft, nontender, nondistended. MS: No deformity or atrophy. Skin: Warm and dry, no rash. Neuro:  Strength and sensation are intact. Psych: Normal affect. Answers some questions appropriately but at other times states he does not know the answer.  Assessment & Plan    Abnormal Carlton Adam Myoview -Myoview 01/2021 with intermediate risk study with partially reversible inferior defect suggestive of prior infarct and mild peri-infarct ischemia with EF 36% with global hypokinesis and mild LV enlargement. He denies chest pain and dyspnea. Reports fatigue and  inability to complete daily activities in same capacity as he could prior to stroke. Will plan for left heart catheterization in the next few days.   Persistent atrial fibrillation/chronic anticoagulation - EKG reveals atrial fibrillation at rate of 89 bpm today. He is compliant with Eliquis 2.5 mg twice daily. Denies bleeding concerns. We will hold Eliquis for 48 hours prior to cath. 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 Plan for cardioversion once ischemic evaluation completed.  Hypertension - BP is well controlled today but somewhat soft. He has not been monitoring at home. Will resume low dose of furosemide for lower extremity edema and monitor BP closely. Will reach out to patient for ability to purchase a home cuff and if not feasible will contact our social worker to obtain cuff for patient.   History of CVA -occurred in setting of new onset atrial fibrillation.  He is now on Eliquis. Patient reports difficulty with memory. Continue to follow with neurology.  Lower extremity edema -previous difficulties with urinary frequency with Lasix. He has bilateral 2+ pitting edema in lower extremities today. Will resume low dose Lasix at 20 mg daily for now and reassess at follow-up appointment later this month.   Urinary frequency -Lasix was previously discontinued.  He was referred to urology for evaluation of urinary incontinence.He appears volume up today with bilateral lower extremity edema. Will resume low dose Lasix at 20 mg daily until follow-up at the end of January with Dr. Radford Pax.   Dilated cardiomyopathy - Echo 12/24/20 revealed LVEF 45-50%, mild dilation of LV, regional wall abnormalities, severe akinesis of the LV, apical anteroseptal wall and apical segment. He denies dyspnea and chest pain however notes severe fatigue and activity intolerance. GDMT complicated by soft BP, bifasicular block, and prior intolerance of diuretic. Would favor resuming Farxiga  at next office visit in 3 weeks.    Shared Decision Making/Informed Consent The risks [stroke (1 in 1000), death (1 in 1000), kidney failure [usually temporary] (1 in 500), bleeding (1 in 200), allergic reaction [possibly serious] (1 in 200)], benefits (diagnostic support and management of coronary artery disease) and alternatives of a cardiac catheterization were discussed in detail with Mr. Simonin and he is willing to proceed.   Disposition: Follow up in 1 month(s) with Fransico Him, MD or APP.  Signed, Emmaline Life, NP 03/07/2021, 12:24 PM [place]

## 2021-03-06 NOTE — Progress Notes (Addendum)
Office Visit    Patient Name: Edwin Wall Date of Encounter: 03/07/2021  PCP:  [name], MD   [facility]  Cardiologist:  Fransico Him, MD  Advanced Practice Provider:  No care team member to display Electrophysiologist:  None     Chief Complaint    Edwin Wall is a 81 y.o. 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, mitral regurgitation, dilated cardiomyopathy presents today for hospital follow up   Past Medical History    Past Medical History:  Diagnosis Date   Anxiety    Arthritis    Cataract    Colon cancer ([place]) dx'd 11/2016   "stage 3"   Gout    Gout    Hemorrhoids    History of kidney stones    "passed it"   Hypertension    Pre-diabetes    Past Surgical History:  Procedure Laterality Date   COLECTOMY  12/17/2016   lap; partial sigmoid colectomy/notes 12/17/2016   COLONOSCOPY W/ BIOPSIES AND POLYPECTOMY  11/11/2016   LAPAROSCOPIC SIGMOID COLECTOMY [name] 12/17/2016   Procedure: LAPAROSCOPIC SIGMOID COLECTOMY;  Surgeon: [name], MD;  Location: [facility];  Service: General;  Laterality: [name];   NASAL SEPTUM SURGERY     TONSILLECTOMY      Allergies  No Known Allergies  History of Present Illness    Edwin Wall is a 81 y.o. 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  He was noted to have new onset atrial fibrillation at the time and started on Eliquis.  Echocardiogram 07/16/2020 with low normal LVEF 50-55%, mild left atrial enlargement, mild MR.  He was referred to the atrial fibrillation clinic.  Admitted 01/16/21 after fall vs syncopal event. He had positive orthostatic vital  signs on admission. 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 Previous echo 12/24/2020 LVEF 45 to 50%, R WMA, normal RV function, LA moderately dilated, RA mildly dilated, mild MR.. Inpatient myoview was intermediate risk study. He was discharged with plan for cardiac catheterization.  Cardiac catheterization had to be delayed as his wife became ill.  Today, he is here with his wife for follow-up of abnormal stress test. He denies chest pain, shortness of breath, palpitations, melena, hematuria, hemoptysis, diaphoresis, weakness, presyncope, syncope, orthopnea, and PND.Reports fatigue and inability to complete activities in the same capacity that he could 6 months ago. Reports he is unable to get groceries in from the house without stopping to rest. Suspect this may be his angina equivalent. He also denies symptoms with atrial fibrillation but suspect increased level of fatigue could be r/t to persistent a fib. He is able to answer some questions but his wife states at other times he is very confused. She notices that confusion increases when the sun goes down. States he sleeps much more than he did prior to stroke. Wife reports she has some confusion about his medications. States PCP told them that patient could stop [place]. She also stopped giving patient his Crestor.   EKGs/Labs/Other Studies Reviewed:   The following studies were reviewed today:  Echo 12/24/20  1. Left ventricular ejection fraction, by estimation, is 45 to 50%. The  left  ventricle has mildly decreased function. The left ventricle  demonstrates regional wall motion abnormalities (see scoring  diagram/findings for description). The left ventricular   internal cavity size was mildly dilated. Left ventricular diastolic  function could not be evaluated. There is severe akinesis of the left  ventricular, apical anteroseptal wall and apical segment.   2. Right ventricular systolic function is  normal. The right ventricular  size is normal.   3. Left atrial size was moderately dilated.   4. Right atrial size was mildly dilated.   5. The mitral valve is grossly normal. Mild mitral valve regurgitation.   6. The aortic valve is normal in structure. Aortic valve regurgitation is  not visualized. No aortic stenosis is present.   Myoview 01/18/21     Findings are consistent with prior myocardial infarction with peri-infarct ischemia. The study is intermediate risk.   No ST deviation was noted.   LV perfusion is abnormal. There is evidence of ischemia. There is evidence of infarction. Defect 1: There is a large defect with severe reduction in uptake present in the apical to basal inferior location(s) that is partially reversible. There is abnormal wall motion in the defect area. Consistent with infarction and peri-infarct ischemia.   Left ventricular function is abnormal. Global function is moderately reduced. The left ventricular ejection fraction is moderately decreased (30-44%). End diastolic cavity size is mildly enlarged.   Abnormal, intermediate risk stress nuclear study with partially reversible inferior defect suggestive of prior infarct and mild peri-infarct ischemia; gated ejection fraction 36% with global hypokinesis but may be inaccurate as patient is in atrial fibrillation; mild left ventricular enlargement.   EKG:  EKG is ordered today.  The ekg ordered today demonstrates atrial fibrillation at rate of 89 bpm with RBBB and LAFB  Recent Labs: 01/16/2021: ALT 17; B Natriuretic Peptide 235.2 01/17/2021: Magnesium 2.1; TSH 1.329 03/06/2021: BUN 19; Creatinine, Ser 1.43; Hemoglobin 14.8; Platelets 249; Potassium 4.5; Sodium 144  Recent Lipid Panel    Component Value Date/Time   CHOL 179 07/16/2020 0337   TRIG 92 07/16/2020 0337   HDL 34 (L) 07/16/2020 0337   CHOLHDL 5.3 07/16/2020 0337   VLDL 18 07/16/2020 0337   LDLCALC 127 (H) 07/16/2020 0337   Home Medications    Current Meds  Medication Sig   apixaban (ELIQUIS) 2.5 MG TABS tablet Take 1 tablet (2.5 mg total) by mouth 2 (two) times daily.   colestipol (COLESTID) 1 g tablet Take 1 tablet (1 g total) by mouth 2 (two) times daily. (Patient taking differently: Take 1 g by mouth daily.)   furosemide (LASIX) 20 MG tablet Take 1 tablet (20 mg total) by mouth daily.   senna-docusate (SENOKOT-S) 8.6-50 MG tablet Take 1 tablet by mouth 2 (two) times daily. (Patient taking differently: Take 1 tablet by mouth daily as needed for mild constipation.)    Review of Systems       All other systems reviewed and are otherwise negative except as noted above.  Physical Exam    VS:  BP 112/76 (BP Location: Left Arm, Patient Position: Sitting, Cuff Size: Large)    Pulse 89    Ht 5\' 8"  (1.727 m)    Wt 191 lb 6.4 oz (86.8 kg)    BMI 29.10 kg/m  , BMI Body mass index is 29.1 kg/m.  Wt Readings from Last 3 Encounters:  03/06/21 191 lb 6.4 oz (86.8 kg)  01/16/21 202 lb (91.6 kg)  01/11/21 202 lb 9.6 oz (91.9 kg)  GEN: Well nourished, well developed elderly gentleman in no acute distress. HEENT: normal. Neck: Supple, no JVD, carotid bruits, or masses. Cardiac: Irregular RR, no murmurs, rubs, or gallops. No clubbing, cyanosis, edema.  Radials/PT 2+ and equal bilaterally.  Respiratory:  Respirations regular and unlabored, clear to auscultation bilaterally. GI: Soft, nontender, nondistended. MS: No deformity or atrophy. Skin: Warm and dry, no rash. Neuro:  Strength and sensation are intact. Psych: Normal affect. Answers some questions appropriately but at other times states he does not know the answer.  Assessment & Plan    Abnormal Carlton Adam Myoview -Myoview 01/2021 with intermediate risk study with partially reversible inferior defect suggestive of prior infarct and mild peri-infarct ischemia with EF 36% with global hypokinesis and mild LV enlargement. He denies chest pain and dyspnea. Reports fatigue and  inability to complete daily activities in same capacity as he could prior to stroke. Will plan for left heart catheterization in the next few days.   Persistent atrial fibrillation/chronic anticoagulation - EKG reveals atrial fibrillation at rate of 89 bpm today. He is compliant with Eliquis 2.5 mg twice daily. Denies bleeding concerns. We will hold Eliquis for 48 hours prior to cath. 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 Plan for cardioversion once ischemic evaluation completed.  Hypertension - BP is well controlled today but somewhat soft. He has not been monitoring at home. Will resume low dose of furosemide for lower extremity edema and monitor BP closely. Will reach out to patient for ability to purchase a home cuff and if not feasible will contact our social worker to obtain cuff for patient.   History of CVA -occurred in setting of new onset atrial fibrillation.  He is now on Eliquis. Patient reports difficulty with memory. Continue to follow with neurology.  Lower extremity edema -previous difficulties with urinary frequency with Lasix. He has bilateral 2+ pitting edema in lower extremities today. Will resume low dose Lasix at 20 mg daily for now and reassess at follow-up appointment later this month.   Urinary frequency -Lasix was previously discontinued.  He was referred to urology for evaluation of urinary incontinence.He appears volume up today with bilateral lower extremity edema. Will resume low dose Lasix at 20 mg daily until follow-up at the end of January with Dr. Radford Pax.   Dilated cardiomyopathy - Echo 12/24/20 revealed LVEF 45-50%, mild dilation of LV, regional wall abnormalities, severe akinesis of the LV, apical anteroseptal wall and apical segment. He denies dyspnea and chest pain however notes severe fatigue and activity intolerance. GDMT complicated by soft BP, bifasicular block, and prior intolerance of diuretic. Would favor resuming Farxiga  at next office visit in 3 weeks.    Shared Decision Making/Informed Consent The risks [stroke (1 in 1000), death (1 in 1000), kidney failure [usually temporary] (1 in 500), bleeding (1 in 200), allergic reaction [possibly serious] (1 in 200)], benefits (diagnostic support and management of coronary artery disease) and alternatives of a cardiac catheterization were discussed in detail with Mr. Rowe and he is willing to proceed.   Disposition: Follow up in 1 month(s) with Fransico Him, MD or APP.  Signed, Emmaline Life, NP 03/07/2021, 12:24 PM [place]

## 2021-03-07 ENCOUNTER — Telehealth: Payer: Self-pay | Admitting: *Deleted

## 2021-03-07 ENCOUNTER — Encounter (HOSPITAL_BASED_OUTPATIENT_CLINIC_OR_DEPARTMENT_OTHER): Payer: Self-pay | Admitting: Nurse Practitioner

## 2021-03-07 LAB — BASIC METABOLIC PANEL
BUN/Creatinine Ratio: 13 (ref 10–24)
BUN: 19 mg/dL (ref 8–27)
CO2: 23 mmol/L (ref 20–29)
Calcium: 9.3 mg/dL (ref 8.6–10.2)
Chloride: 105 mmol/L (ref 96–106)
Creatinine, Ser: 1.43 mg/dL — ABNORMAL HIGH (ref 0.76–1.27)
Glucose: 115 mg/dL — ABNORMAL HIGH (ref 70–99)
Potassium: 4.5 mmol/L (ref 3.5–5.2)
Sodium: 144 mmol/L (ref 134–144)
eGFR: 50 mL/min/{1.73_m2} — ABNORMAL LOW (ref >59)

## 2021-03-07 LAB — CBC
Hematocrit: 44.3 % (ref 37.5–51.0)
Hemoglobin: 14.8 g/dL (ref 13.0–17.7)
MCH: 30.6 pg (ref 26.6–33.0)
MCHC: 33.4 g/dL (ref 31.5–35.7)
MCV: 92 fL (ref 79–97)
Platelets: 249 10*3/uL (ref 150–450)
RBC: 4.84 x10E6/uL (ref 4.14–5.80)
RDW: 13.1 % (ref 11.6–15.4)
WBC: 7.1 10*3/uL (ref 3.4–10.8)

## 2021-03-07 NOTE — Telephone Encounter (Signed)
Cardiac catheterization scheduled at [facility] for: Monday March 11, 2021 [facility] Main Entrance [facility]) at: 9 AM    Diet-no solid food after midnight prior to cath, clear liquids until 5 AM day of procedure.  Medication instructions for procedure: -Hold:  Eliquis-last dose AM 03/09/21 until post procedure  Lasix-AM of procedure -Except hold medication usual morning medications can be taken pre-cath with sips of water including aspirin 81 mg.    Confirmed patient has responsible adult to drive home post procedure and be with patient first 24 hours after arriving home.  [facility] does allow one visitor to accompany you and wait in the hospital waiting room while you are there for your procedure. You and your visitor will be asked to wear a mask once you enter the hospital.   Patient reports does not currently have any new symptoms concerning for COVID-19 and no household members with COVID-19 like illness.    Reviewed procedure/mask/visitor instructions with patient's wife (DPR).

## 2021-03-11 ENCOUNTER — Ambulatory Visit (HOSPITAL_COMMUNITY)
Admission: RE | Admit: 2021-03-11 | Discharge: 2021-03-11 | Disposition: A | Discharge status: Home / Self Care | Payer: Medicare Other | Attending: Internal Medicine | Admitting: Internal Medicine

## 2021-03-11 ENCOUNTER — Encounter (HOSPITAL_COMMUNITY)
Admission: RE | Disposition: A | Discharge status: Home / Self Care | Payer: Self-pay | Source: Home / Self Care | Attending: Internal Medicine

## 2021-03-11 ENCOUNTER — Other Ambulatory Visit: Payer: Self-pay

## 2021-03-11 DIAGNOSIS — R9439 Abnormal result of other cardiovascular function study: Secondary | ICD-10-CM

## 2021-03-11 DIAGNOSIS — I420 Dilated cardiomyopathy: Secondary | ICD-10-CM | POA: Diagnosis not present

## 2021-03-11 DIAGNOSIS — I340 Nonrheumatic mitral (valve) insufficiency: Secondary | ICD-10-CM | POA: Insufficient documentation

## 2021-03-11 DIAGNOSIS — R7303 Prediabetes: Secondary | ICD-10-CM | POA: Insufficient documentation

## 2021-03-11 DIAGNOSIS — I4819 Other persistent atrial fibrillation: Secondary | ICD-10-CM | POA: Diagnosis not present

## 2021-03-11 DIAGNOSIS — R350 Frequency of micturition: Secondary | ICD-10-CM | POA: Diagnosis not present

## 2021-03-11 DIAGNOSIS — Z7901 Long term (current) use of anticoagulants: Secondary | ICD-10-CM | POA: Insufficient documentation

## 2021-03-11 DIAGNOSIS — I10 Essential (primary) hypertension: Secondary | ICD-10-CM | POA: Insufficient documentation

## 2021-03-11 DIAGNOSIS — R55 Syncope and collapse: Secondary | ICD-10-CM | POA: Diagnosis present

## 2021-03-11 DIAGNOSIS — Z85038 Personal history of other malignant neoplasm of large intestine: Secondary | ICD-10-CM | POA: Diagnosis not present

## 2021-03-11 DIAGNOSIS — I2510 Atherosclerotic heart disease of native coronary artery without angina pectoris: Secondary | ICD-10-CM | POA: Diagnosis present

## 2021-03-11 DIAGNOSIS — M109 Gout, unspecified: Secondary | ICD-10-CM | POA: Insufficient documentation

## 2021-03-11 HISTORY — PX: LEFT HEART CATH AND CORONARY ANGIOGRAPHY: CATH118249

## 2021-03-11 SURGERY — LEFT HEART CATH AND CORONARY ANGIOGRAPHY
Anesthesia: LOCAL

## 2021-03-11 MED ORDER — SODIUM CHLORIDE 0.9 % IV SOLN: 250.0000 mL | INTRAVENOUS | Status: DC | PRN

## 2021-03-11 MED ORDER — VERAPAMIL HCL 2.5 MG/ML IV SOLN
INTRAVENOUS | Status: DC | PRN
  Administered 2021-03-11: 10 mL via INTRA_ARTERIAL

## 2021-03-11 MED ORDER — ASPIRIN 81 MG PO CHEW: 81.0000 mg | CHEWABLE_TABLET | ORAL | Status: DC

## 2021-03-11 MED ORDER — SODIUM CHLORIDE 0.9 % IV SOLN: INTRAVENOUS | Status: DC

## 2021-03-11 MED ORDER — LIDOCAINE HCL (PF) 1 % IJ SOLN
INTRAMUSCULAR | Status: DC | PRN
  Administered 2021-03-11: 2 mL

## 2021-03-11 MED ORDER — LIDOCAINE HCL (PF) 1 % IJ SOLN
INTRAMUSCULAR | Status: AC
  Filled 2021-03-11: qty 30

## 2021-03-11 MED ORDER — HEPARIN SODIUM (PORCINE) 1000 UNIT/ML IJ SOLN
INTRAMUSCULAR | Status: AC
  Filled 2021-03-11: qty 10

## 2021-03-11 MED ORDER — HEPARIN SODIUM (PORCINE) 1000 UNIT/ML IJ SOLN
INTRAMUSCULAR | Status: DC | PRN
  Administered 2021-03-11: 4000 [IU] via INTRAVENOUS

## 2021-03-11 MED ORDER — SODIUM CHLORIDE 0.9% FLUSH: 3.0000 mL | INTRAVENOUS | Status: DC | PRN

## 2021-03-11 MED ORDER — SODIUM CHLORIDE 0.9% FLUSH: 3.0000 mL | Freq: Two times a day (BID) | INTRAVENOUS | Status: DC

## 2021-03-11 MED ORDER — HEPARIN (PORCINE) IN NACL 1000-0.9 UT/500ML-% IV SOLN
INTRAVENOUS | Status: DC | PRN
  Administered 2021-03-11 (×2): 500 mL

## 2021-03-11 MED ORDER — VERAPAMIL HCL 2.5 MG/ML IV SOLN
INTRAVENOUS | Status: AC
  Filled 2021-03-11: qty 2

## 2021-03-11 MED ORDER — IOHEXOL 350 MG/ML SOLN
INTRAVENOUS | Status: DC | PRN
  Administered 2021-03-11: 30 mL

## 2021-03-11 MED ORDER — HEPARIN (PORCINE) IN NACL 1000-0.9 UT/500ML-% IV SOLN
INTRAVENOUS | Status: AC
  Filled 2021-03-11: qty 1000

## 2021-03-11 SURGICAL SUPPLY — 10 items
CATH OPTITORQUE TIG 4.0 5F (CATHETERS) ×1 IMPLANT
DEVICE RAD COMP TR BAND LRG (VASCULAR PRODUCTS) ×1 IMPLANT
GLIDESHEATH SLEND SS 6F .021 (SHEATH) ×1 IMPLANT
GUIDEWIRE INQWIRE 1.5J.035X260 (WIRE) IMPLANT
INQWIRE 1.5J .035X260CM (WIRE) ×2
KIT HEART LEFT (KITS) ×2 IMPLANT
PACK CARDIAC CATHETERIZATION (CUSTOM PROCEDURE TRAY) ×2 IMPLANT
SYR MEDRAD MARK 7 150ML (SYRINGE) ×2 IMPLANT
TRANSDUCER W/STOPCOCK (MISCELLANEOUS) ×2 IMPLANT
TUBING CIL FLEX 10 FLL-RA (TUBING) ×2 IMPLANT

## 2021-03-11 NOTE — Interval H&P Note (Signed)
History and Physical Interval Note:  03/11/2021 10:36 AM  Edwin Wall  has presented today for surgery, with the diagnosis of syncope, cardiomyopathy, and abnormal stress test.  The various methods of treatment have been discussed with the patient and family. After consideration of risks, benefits and other options for treatment, the patient has consented to  Procedure(s): LEFT HEART CATH AND CORONARY ANGIOGRAPHY ([name]) as a surgical intervention.  The patient's history has been reviewed, patient examined, no change in status, stable for surgery.  I have reviewed the patient's chart and labs.  Questions were answered to the patient's satisfaction.    Cath Lab Visit (complete for each Cath Lab visit)  Clinical Evaluation Leading to the Procedure:   ACS: No.  Non-ACS:    Anginal Classification: CCS I  Anti-ischemic medical therapy: No Therapy  Non-Invasive Test Results: Intermediate-risk stress test findings: cardiac mortality 1-3%/year  Prior CABG: No previous CABG  [name]

## 2021-03-12 ENCOUNTER — Telehealth (HOSPITAL_COMMUNITY): Payer: Self-pay | Admitting: Physician Assistant

## 2021-03-12 ENCOUNTER — Encounter (HOSPITAL_COMMUNITY): Payer: Self-pay | Admitting: Internal Medicine

## 2021-03-12 NOTE — Telephone Encounter (Signed)
Called and left VM on patient's spouse, [name]-EC on file, VM asking her to have patient call to schedule an appt.

## 2021-03-12 NOTE — Telephone Encounter (Signed)
Called patient to schedule follow up appt to set up cardioversion per Rushie Goltz, RN.  No answer and VM is full, unable to leave message.  Will try to call back.

## 2021-03-13 NOTE — Telephone Encounter (Signed)
Called and spoke with patient's wife, she is agreeable to appt 03/21/21.

## 2021-03-21 ENCOUNTER — Other Ambulatory Visit: Payer: Self-pay

## 2021-03-21 ENCOUNTER — Ambulatory Visit (HOSPITAL_COMMUNITY)
Admission: RE | Admit: 2021-03-21 | Discharge: 2021-03-21 | Disposition: A | Discharge status: Home / Self Care | Payer: Medicare Other | Source: Ambulatory Visit | Attending: Physician Assistant | Admitting: Physician Assistant

## 2021-03-21 ENCOUNTER — Encounter (HOSPITAL_COMMUNITY): Payer: Self-pay | Admitting: Physician Assistant

## 2021-03-21 VITALS — BP 108/82 | HR 87 | Ht 68.0 in | Wt 187.6 lb

## 2021-03-21 DIAGNOSIS — N189 Chronic kidney disease, unspecified: Secondary | ICD-10-CM | POA: Insufficient documentation

## 2021-03-21 DIAGNOSIS — I130 Hypertensive heart and chronic kidney disease with heart failure and stage 1 through stage 4 chronic kidney disease, or unspecified chronic kidney disease: Secondary | ICD-10-CM | POA: Diagnosis not present

## 2021-03-21 DIAGNOSIS — Z7901 Long term (current) use of anticoagulants: Secondary | ICD-10-CM | POA: Diagnosis not present

## 2021-03-21 DIAGNOSIS — D6869 Other thrombophilia: Secondary | ICD-10-CM

## 2021-03-21 DIAGNOSIS — I5022 Chronic systolic (congestive) heart failure: Secondary | ICD-10-CM | POA: Insufficient documentation

## 2021-03-21 DIAGNOSIS — I4819 Other persistent atrial fibrillation: Secondary | ICD-10-CM | POA: Diagnosis present

## 2021-03-21 DIAGNOSIS — Z85038 Personal history of other malignant neoplasm of large intestine: Secondary | ICD-10-CM | POA: Diagnosis not present

## 2021-03-21 LAB — CBC
HCT: 44.7 % (ref 39.0–52.0)
Hemoglobin: 14 g/dL (ref 13.0–17.0)
MCH: 30.4 pg (ref 26.0–34.0)
MCHC: 31.3 g/dL (ref 30.0–36.0)
MCV: 97.2 fL (ref 80.0–100.0)
Platelets: 227 10*3/uL (ref 150–400)
RBC: 4.6 MIL/uL (ref 4.22–5.81)
RDW: 14.1 % (ref 11.5–15.5)
WBC: 7.1 10*3/uL (ref 4.0–10.5)
nRBC: 0 % (ref 0.0–0.2)

## 2021-03-21 LAB — BASIC METABOLIC PANEL
Anion gap: 13 (ref 5–15)
BUN: 24 mg/dL — ABNORMAL HIGH (ref 8–23)
CO2: 25 mmol/L (ref 22–32)
Calcium: 9.4 mg/dL (ref 8.9–10.3)
Chloride: 103 mmol/L (ref 98–111)
Creatinine, Ser: 1.43 mg/dL — ABNORMAL HIGH (ref 0.61–1.24)
GFR, Estimated: 50 mL/min — ABNORMAL LOW (ref >60)
Glucose, Bld: 118 mg/dL — ABNORMAL HIGH (ref 70–99)
Potassium: 3.7 mmol/L (ref 3.5–5.1)
Sodium: 141 mmol/L (ref 135–145)

## 2021-03-21 NOTE — Patient Instructions (Signed)
Cardioversion scheduled for Thursday, February 2nd  - Arrive at the [facility] and go to admitting at 930AM  - Do not eat or drink anything after midnight the night prior to your procedure.  - Take all your morning medication (except diabetic medications) with a sip of water prior to arrival.  - You will not be able to drive home after your procedure.  - Do NOT miss any doses of your blood thinner - if you should miss a dose please notify our office immediately.  - If you feel as if you go back into normal rhythm prior to scheduled cardioversion, please notify our office immediately. If your procedure is canceled in the cardioversion suite you will be charged a cancellation fee. Patients will be asked to: to mask in public and hand hygiene (no longer quarantine) in the 3 days prior to surgery, to report if any COVID-19-like illness or household contacts to COVID-19 to determine need for testing

## 2021-03-21 NOTE — Progress Notes (Signed)
Primary Care Physician: [name], MD Primary Cardiologist: Dr Radford Pax Primary Electrophysiologist: none Referring Physician: Dr Horton [name] is a 81 y.o. male with a history of colon cancer s/p sigmoid colectomy 2018, CKD, CVA, HTN, atrial fibrillation who presents for consultation in the [facility].  The patient was initially diagnosed with atrial fibrillation 07/2020 in the setting of acute CVA. Patient is on Eliquis for a CHADS2VASC score of 5. Patient was admitted 12/23/20 with a mechanical fall. patient was carrying a heavy load and walking down the back porch when he fell.  Wife states that he did not lose consciousness and was able to get up with help from his son.  Patient came back inside and rested on a chair.  30 minutes later wife came back and found him difficult to arouse and called EMS.  Patient states he does not remember the event but also states that he did not lose consciousness. Echo showed mildly reduced EF 45-50% with severe akineses of apical anteroseptal wall and apical segment.   On follow up today, patient underwent LHC on 03/11/21 which showed mild-mod nonobstructive CAD, medical therapy recommended. Patient feels well today despite being in afib. He denies any missed doses of anticoagulation since the [facility].   Today, he denies symptoms of palpitations, chest pain, shortness of breath, orthopnea, PND, lower extremity edema, dizziness, presyncope, syncope, snoring, daytime somnolence, bleeding.The patient is tolerating medications without difficulties and is otherwise without complaint today.    Atrial Fibrillation Risk Factors:  he does not have symptoms or diagnosis of sleep apnea. he does not have a history of rheumatic fever. he does not have a history of alcohol use.   he has a BMI of Body mass index is 28.52 kg/m.Marland Kitchen Filed Weights   03/21/21 1521  Weight: 85.1 kg     Family History  Problem Relation Age  of Onset   Cancer Mother        LUNG   COPD Father      Atrial Fibrillation Management history:  Previous antiarrhythmic drugs: none Previous cardioversions: none Previous ablations: none CHADS2VASC score: 5 Anticoagulation history: Eliquis   Past Medical History:  Diagnosis Date   Anxiety    Arthritis    Cataract    Colon cancer ([place]) dx'd 11/2016   "stage 3"   Gout    Gout    Hemorrhoids    History of kidney stones    "passed it"   Hypertension    Pre-diabetes    Past Surgical History:  Procedure Laterality Date   COLECTOMY  12/17/2016   lap; partial sigmoid colectomy/notes 12/17/2016   COLONOSCOPY W/ BIOPSIES AND POLYPECTOMY  11/11/2016   LAPAROSCOPIC SIGMOID COLECTOMY [name] 12/17/2016   Procedure: LAPAROSCOPIC SIGMOID COLECTOMY;  Surgeon: [name], MD;  Location: [place];  Service: General;  Laterality: [name];   LEFT HEART CATH AND CORONARY ANGIOGRAPHY [name] 03/11/2021   Procedure: LEFT HEART CATH AND CORONARY ANGIOGRAPHY;  Surgeon: [name], MD;  Location: [place] [facility];  Service: Cardiovascular;  Laterality: [name];   NASAL SEPTUM SURGERY     TONSILLECTOMY      Current Outpatient Medications  Medication Sig Dispense Refill   apixaban (ELIQUIS) 2.5 MG TABS tablet Take 1 tablet (2.5 mg total) by mouth 2 (two) times daily. 180 tablet 3   colestipol (COLESTID) 1 g tablet Take 1 tablet (1 g total) by mouth 2 (two) times daily. (Patient taking differently: Take 1 g  by mouth daily.) 30 tablet 0   furosemide (LASIX) 20 MG tablet Take 1 tablet (20 mg total) by mouth daily. 90 tablet 3   rosuvastatin (CRESTOR) 40 MG tablet Take 1 tablet (40 mg total) by mouth daily. 30 tablet 0   No current facility-administered medications for this encounter.    No Known Allergies  Social History   Socioeconomic History   Marital status: Married    Spouse name: Not on file   Number of children: Not on file   Years of education: Not on file   Highest education level: Not  on file  Occupational History   Not on file  Tobacco Use   Smoking status: Never   Smokeless tobacco: Never  Vaping Use   Vaping Use: Never used  Substance and Sexual Activity   Alcohol use: No   Drug use: No   Sexual activity: Never  Other Topics Concern   Not on file  Social History Narrative   Not on file   Social Determinants of Health   Financial Resource Strain: Not on file  Food Insecurity: Not on file  Transportation Needs: Not on file  Physical Activity: Not on file  Stress: Not on file  Social Connections: Not on file  Intimate Partner Violence: Not on file     ROS- All systems are reviewed and negative except as per the HPI above.  Physical Exam: Vitals:   03/21/21 1521  BP: 108/82  Pulse: 87  Weight: 85.1 kg  Height: 5\' 8"  (1.727 m)    GEN- The patient is a well appearing elderly male, alert and oriented x 3 today.   HEENT-head normocephalic, atraumatic, sclera clear, conjunctiva pink, hearing intact, trachea midline. Lungs- Clear to ausculation bilaterally, normal work of breathing Heart- irregular rate and rhythm, no murmurs, rubs or gallops  GI- soft, NT, ND, + BS Extremities- no clubbing, cyanosis, or edema MS- no significant deformity or atrophy Skin- no rash or lesion Psych- euthymic mood, full affect Neuro- strength and sensation are intact   Wt Readings from Last 3 Encounters:  03/21/21 85.1 kg  03/11/21 88 kg  03/06/21 86.8 kg    EKG today demonstrates  Afib, RBBB, LAFB Vent. rate 87 BPM PR interval * ms QRS duration 134 ms QT/QTcB 390/469 ms  Echo 12/24/20 demonstrated   1. Left ventricular ejection fraction, by estimation, is 45 to 50%. The  left ventricle has mildly decreased function. The left ventricle  demonstrates regional wall motion abnormalities (see scoring  diagram/findings for description). The left ventricular   internal cavity size was mildly dilated. Left ventricular diastolic  function could not be evaluated.  There is severe akinesis of the left  ventricular, apical anteroseptal wall and apical segment.   2. Right ventricular systolic function is normal. The right ventricular  size is normal.   3. Left atrial size was moderately dilated.   4. Right atrial size was mildly dilated.   5. The mitral valve is grossly normal. Mild mitral valve regurgitation.   6. The aortic valve is normal in structure. Aortic valve regurgitation is  not visualized. No aortic stenosis is present.   Epic records are reviewed at length today  CHA2DS2-VASc Score = 5  The patient's score is based upon: CHF History: 0 HTN History: 1 Diabetes History: 0 Stroke History: 2 Vascular Disease History: 0 Age Score: 2 Gender Score: 0       ASSESSMENT AND PLAN: 1. Persistent Atrial Fibrillation (ICD10:  I48.19) The patient's CHA2DS2-VASc  score is 5, indicating a 7.2% annual risk of stroke.   We discussed rhythm control options today. Will plan for DCCV after 3 weeks of uninterrupted anticoagulation (1/30). Check bmet/cbc today.  Continue Eliquis 2.5 mg BID  2. Secondary Hypercoagulable State (ICD10:  D68.69) The patient is at significant risk for stroke/thromboembolism based upon his CHA2DS2-VASc Score of 5.  Continue Apixaban (Eliquis).   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 No signs or symptoms of fluid overload.  4. HTN Stable, no changes today.   Follow up in the AF clinic post DCCV.    [facility] [address] [place], [place] [address] [phone] 03/21/2021 3:53 PM

## 2021-03-26 ENCOUNTER — Ambulatory Visit: Payer: Medicare Other | Admitting: Cardiology

## 2021-03-27 ENCOUNTER — Encounter (HOSPITAL_COMMUNITY): Payer: Self-pay | Admitting: Cardiovascular Disease

## 2021-04-03 ENCOUNTER — Other Ambulatory Visit (HOSPITAL_COMMUNITY): Payer: Self-pay | Admitting: *Deleted

## 2021-04-03 DIAGNOSIS — I4819 Other persistent atrial fibrillation: Secondary | ICD-10-CM

## 2021-04-11 ENCOUNTER — Ambulatory Visit (HOSPITAL_COMMUNITY): Payer: Medicare Other | Admitting: Physician Assistant

## 2021-04-14 ENCOUNTER — Observation Stay (HOSPITAL_COMMUNITY): Payer: Medicare Other

## 2021-04-14 ENCOUNTER — Encounter (HOSPITAL_COMMUNITY): Payer: Self-pay | Admitting: Emergency Medicine

## 2021-04-14 ENCOUNTER — Observation Stay (HOSPITAL_COMMUNITY)
Admission: EM | Admit: 2021-04-14 | Discharge: 2021-04-15 | Disposition: A | Discharge status: Home / Self Care | Payer: Medicare Other | Attending: Internal Medicine | Admitting: Internal Medicine

## 2021-04-14 ENCOUNTER — Emergency Department (HOSPITAL_COMMUNITY): Payer: Medicare Other

## 2021-04-14 ENCOUNTER — Other Ambulatory Visit: Payer: Self-pay

## 2021-04-14 DIAGNOSIS — Z85038 Personal history of other malignant neoplasm of large intestine: Secondary | ICD-10-CM | POA: Insufficient documentation

## 2021-04-14 DIAGNOSIS — E876 Hypokalemia: Secondary | ICD-10-CM | POA: Insufficient documentation

## 2021-04-14 DIAGNOSIS — N1832 Chronic kidney disease, stage 3b: Secondary | ICD-10-CM | POA: Insufficient documentation

## 2021-04-14 DIAGNOSIS — I5022 Chronic systolic (congestive) heart failure: Secondary | ICD-10-CM | POA: Insufficient documentation

## 2021-04-14 DIAGNOSIS — I4819 Other persistent atrial fibrillation: Secondary | ICD-10-CM | POA: Insufficient documentation

## 2021-04-14 DIAGNOSIS — R4182 Altered mental status, unspecified: Secondary | ICD-10-CM | POA: Diagnosis not present

## 2021-04-14 DIAGNOSIS — Z79899 Other long term (current) drug therapy: Secondary | ICD-10-CM | POA: Diagnosis not present

## 2021-04-14 DIAGNOSIS — R2689 Other abnormalities of gait and mobility: Secondary | ICD-10-CM | POA: Insufficient documentation

## 2021-04-14 DIAGNOSIS — Z7901 Long term (current) use of anticoagulants: Secondary | ICD-10-CM | POA: Diagnosis not present

## 2021-04-14 DIAGNOSIS — E119 Type 2 diabetes mellitus without complications: Secondary | ICD-10-CM | POA: Diagnosis not present

## 2021-04-14 DIAGNOSIS — I130 Hypertensive heart and chronic kidney disease with heart failure and stage 1 through stage 4 chronic kidney disease, or unspecified chronic kidney disease: Secondary | ICD-10-CM | POA: Diagnosis not present

## 2021-04-14 DIAGNOSIS — Z20822 Contact with and (suspected) exposure to covid-19: Secondary | ICD-10-CM | POA: Insufficient documentation

## 2021-04-14 LAB — CBC WITH DIFFERENTIAL/PLATELET
Abs Immature Granulocytes: 0.02 10*3/uL (ref 0.00–0.07)
Basophils Absolute: 0 10*3/uL (ref 0.0–0.1)
Basophils Relative: 1 %
Eosinophils Absolute: 0.2 10*3/uL (ref 0.0–0.5)
Eosinophils Relative: 3 %
HCT: 45.9 % (ref 39.0–52.0)
Hemoglobin: 15.1 g/dL (ref 13.0–17.0)
Immature Granulocytes: 0 %
Lymphocytes Relative: 24 %
Lymphs Abs: 1.3 10*3/uL (ref 0.7–4.0)
MCH: 31.3 pg (ref 26.0–34.0)
MCHC: 32.9 g/dL (ref 30.0–36.0)
MCV: 95 fL (ref 80.0–100.0)
Monocytes Absolute: 0.3 10*3/uL (ref 0.1–1.0)
Monocytes Relative: 5 %
Neutro Abs: 3.6 10*3/uL (ref 1.7–7.7)
Neutrophils Relative %: 67 %
Platelets: 208 10*3/uL (ref 150–400)
RBC: 4.83 MIL/uL (ref 4.22–5.81)
RDW: 13.8 % (ref 11.5–15.5)
WBC: 5.3 10*3/uL (ref 4.0–10.5)
nRBC: 0 % (ref 0.0–0.2)

## 2021-04-14 LAB — URINALYSIS, ROUTINE W REFLEX MICROSCOPIC
Bilirubin Urine: NEGATIVE
Glucose, UA: NEGATIVE mg/dL
Ketones, ur: NEGATIVE mg/dL
Nitrite: NEGATIVE
Protein, ur: NEGATIVE mg/dL
Specific Gravity, Urine: 1.011 (ref 1.005–1.030)
pH: 5 (ref 5.0–8.0)

## 2021-04-14 LAB — RAPID URINE DRUG SCREEN, HOSP PERFORMED
Amphetamines: NOT DETECTED
Barbiturates: NOT DETECTED
Benzodiazepines: NOT DETECTED
Cocaine: NOT DETECTED
Opiates: NOT DETECTED
Tetrahydrocannabinol: NOT DETECTED

## 2021-04-14 LAB — COMPREHENSIVE METABOLIC PANEL
ALT: 13 U/L (ref 0–44)
AST: 19 U/L (ref 15–41)
Albumin: 3.2 g/dL — ABNORMAL LOW (ref 3.5–5.0)
Alkaline Phosphatase: 86 U/L (ref 38–126)
Anion gap: 12 (ref 5–15)
BUN: 13 mg/dL (ref 8–23)
CO2: 27 mmol/L (ref 22–32)
Calcium: 9.3 mg/dL (ref 8.9–10.3)
Chloride: 103 mmol/L (ref 98–111)
Creatinine, Ser: 1.76 mg/dL — ABNORMAL HIGH (ref 0.61–1.24)
GFR, Estimated: 39 mL/min — ABNORMAL LOW (ref >60)
Glucose, Bld: 113 mg/dL — ABNORMAL HIGH (ref 70–99)
Potassium: 2.9 mmol/L — ABNORMAL LOW (ref 3.5–5.1)
Sodium: 142 mmol/L (ref 135–145)
Total Bilirubin: 1.3 mg/dL — ABNORMAL HIGH (ref 0.3–1.2)
Total Protein: 6.5 g/dL (ref 6.5–8.1)

## 2021-04-14 LAB — RESP PANEL BY RT-PCR (FLU A&B, COVID) ARPGX2
Influenza A by PCR: NEGATIVE
Influenza B by PCR: NEGATIVE
SARS Coronavirus 2 by RT PCR: NEGATIVE

## 2021-04-14 LAB — ACETAMINOPHEN LEVEL: Acetaminophen (Tylenol), Serum: <10 ug/mL — ABNORMAL LOW (ref 10–30)

## 2021-04-14 LAB — AMMONIA: Ammonia: 44 umol/L — ABNORMAL HIGH (ref 9–35)

## 2021-04-14 LAB — BASIC METABOLIC PANEL
Anion gap: 9 (ref 5–15)
BUN: 17 mg/dL (ref 8–23)
CO2: 24 mmol/L (ref 22–32)
Calcium: 8.4 mg/dL — ABNORMAL LOW (ref 8.9–10.3)
Chloride: 104 mmol/L (ref 98–111)
Creatinine, Ser: 1.86 mg/dL — ABNORMAL HIGH (ref 0.61–1.24)
GFR, Estimated: 36 mL/min — ABNORMAL LOW (ref >60)
Glucose, Bld: 149 mg/dL — ABNORMAL HIGH (ref 70–99)
Potassium: 3.7 mmol/L (ref 3.5–5.1)
Sodium: 137 mmol/L (ref 135–145)

## 2021-04-14 LAB — SALICYLATE LEVEL: Salicylate Lvl: <7 mg/dL — ABNORMAL LOW (ref 7.0–30.0)

## 2021-04-14 LAB — CBG MONITORING, ED: Glucose-Capillary: 106 mg/dL — ABNORMAL HIGH (ref 70–99)

## 2021-04-14 LAB — ETHANOL: Alcohol, Ethyl (B): <10 mg/dL (ref <10)

## 2021-04-14 IMAGING — CT CT HEAD W/O CM
4 series · 16 of 47 positions shown, 18 images · non-contrast
Comparison: [DATE]

CLINICAL DATA: Mental status change with unknown cause



[Series 2: head wo · axial · 0.42mm/px · z∈[-110,+6]mm · 7 of 31 slices shown, 9 images]
[im 4/31  brain]
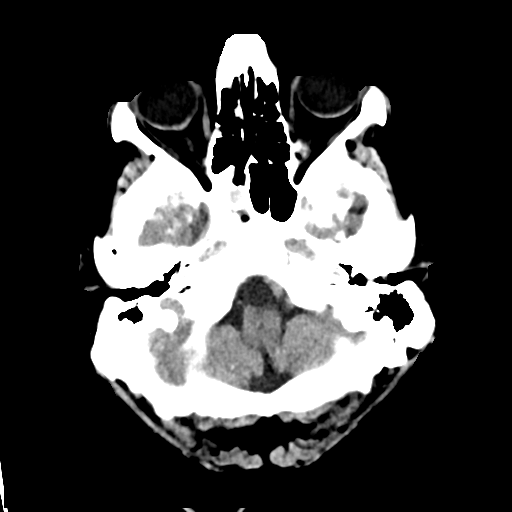
[im 4/31  bone]
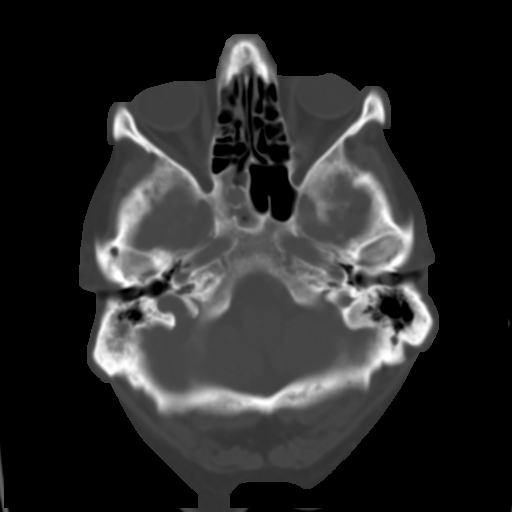
[im 8/31  brain]
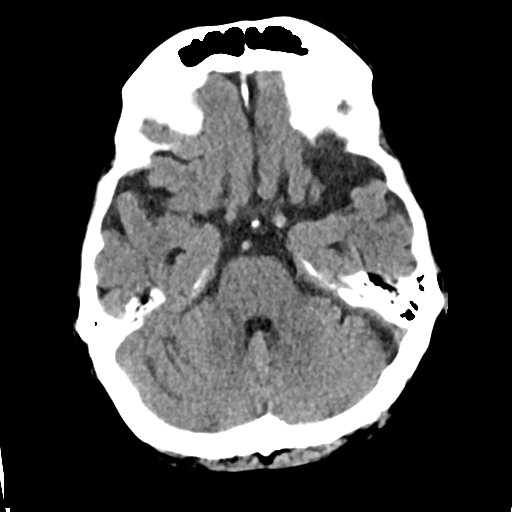
[im 12/31  brain]
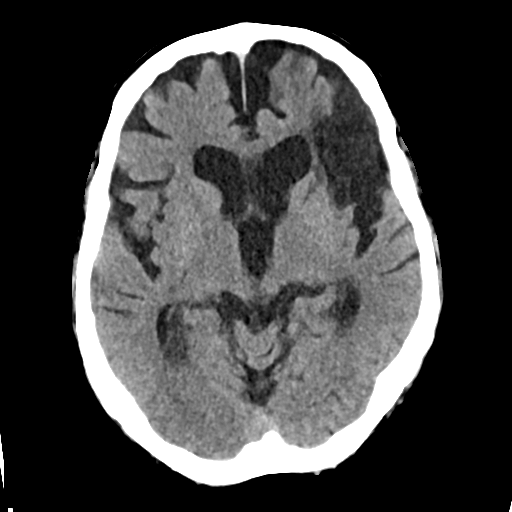
[im 16/31  brain]
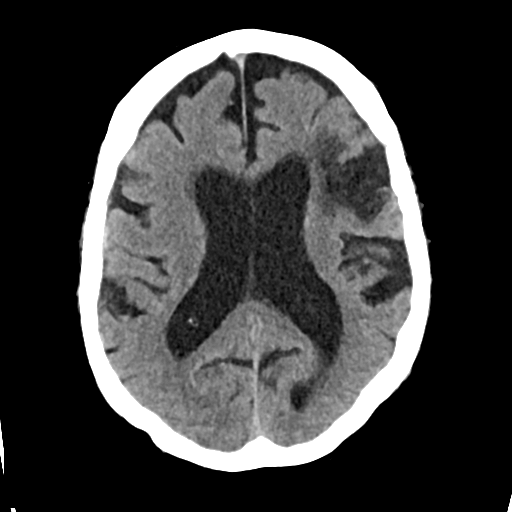
[im 19/31  brain]
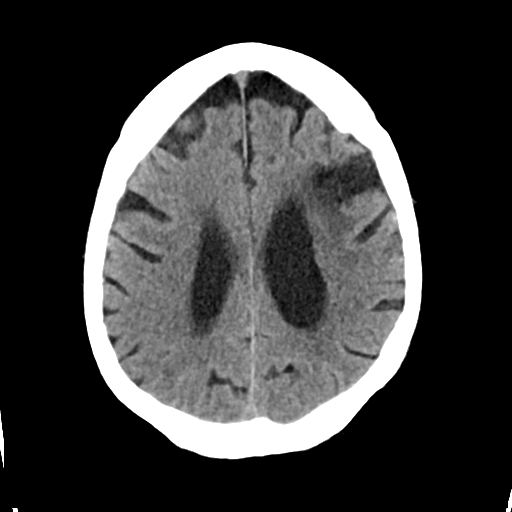
[im 19/31  bone]
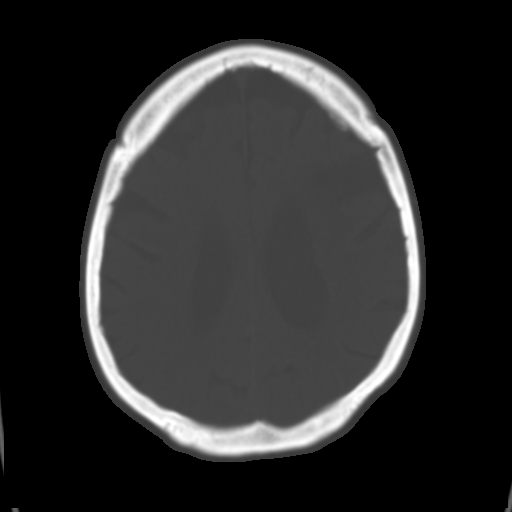
[im 23/31  brain]
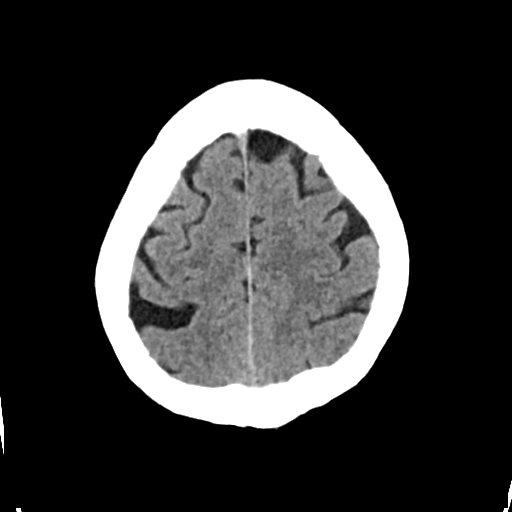
[im 27/31  brain]
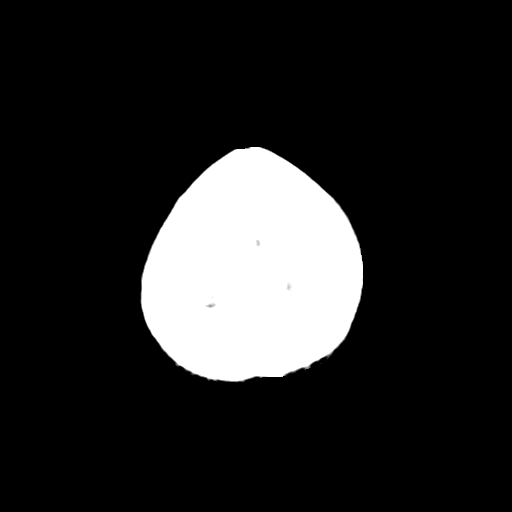

[Series 3: head bone · axial · 0.42mm/px · z∈[-110,-78]mm · 3 of 78 slices shown]
[im 8/78  bone]
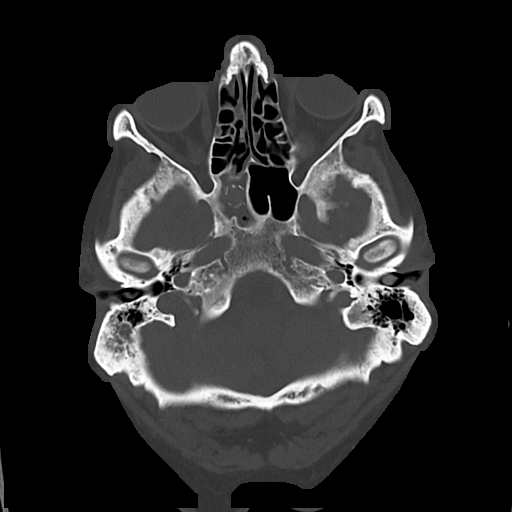
[im 16/78  bone]
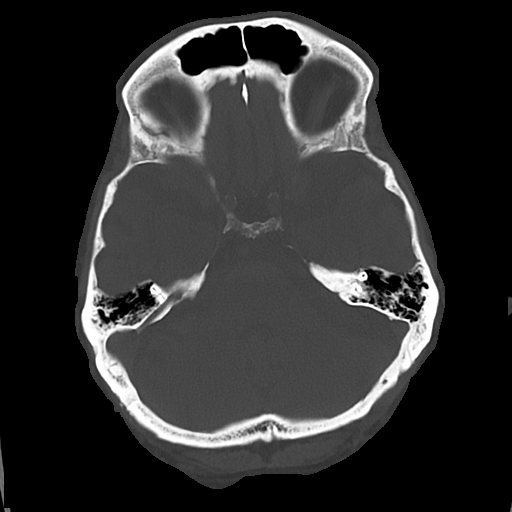
[im 24/78  bone]
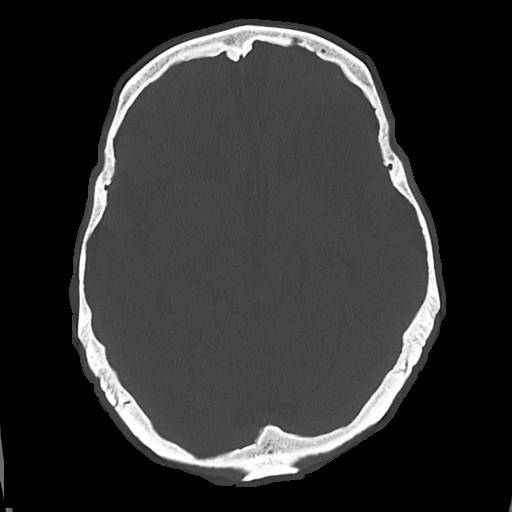

[Series 4: cor soft · coronal · 0.30mm/px · 3 of 69 slices shown]
[im 23/69  brain]
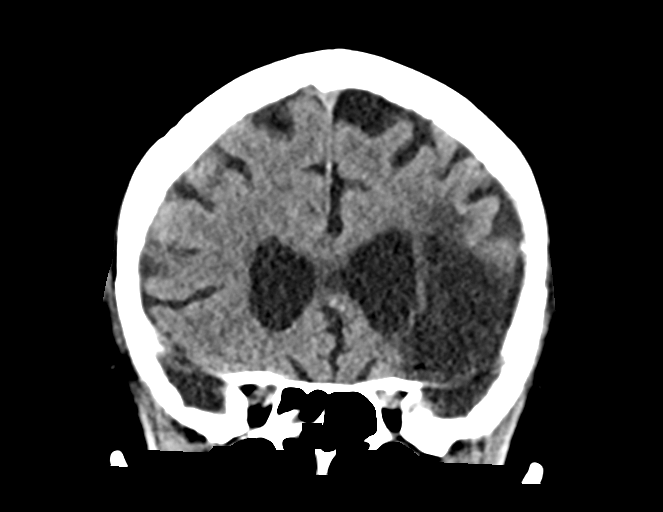
[im 31/69  brain]
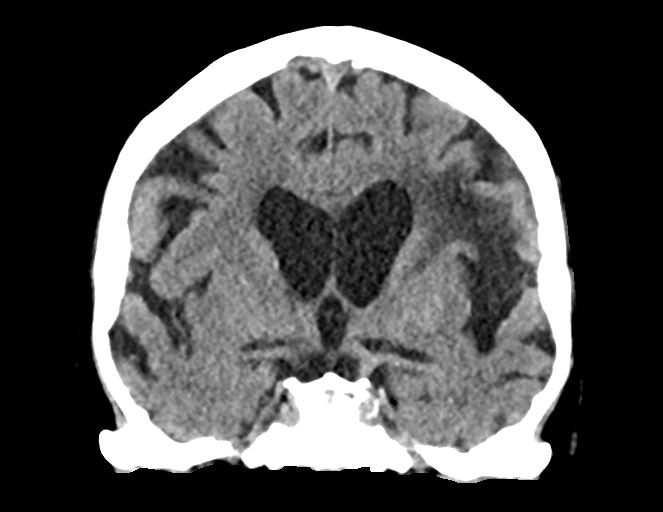
[im 38/69  brain]
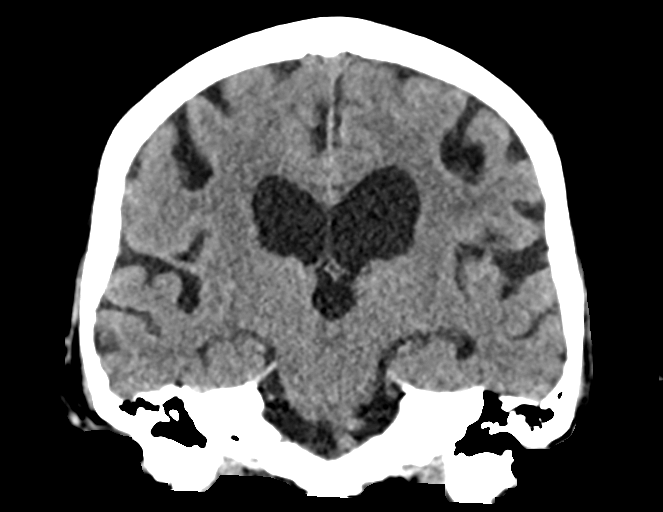

[Series 5: sag soft · sagittal · 0.30mm/px · 3 of 67 slices shown]
[im 23/67  brain]
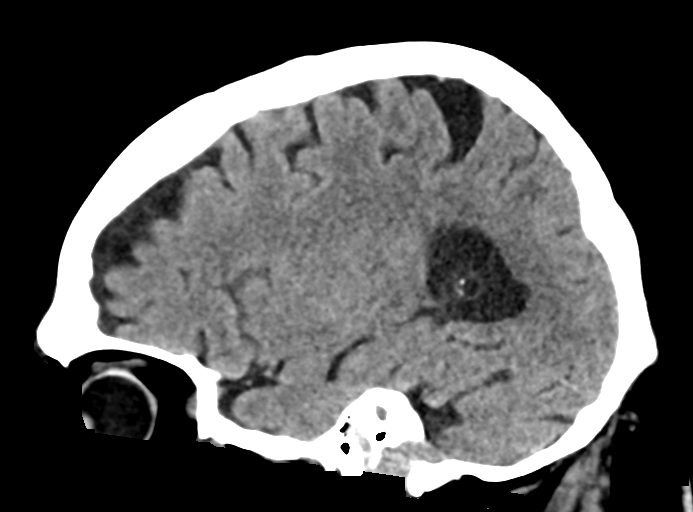
[im 34/67  brain]
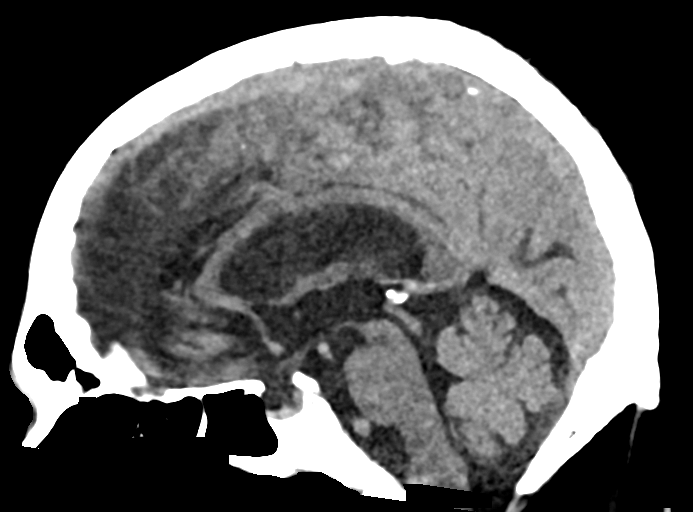
[im 45/67  brain]
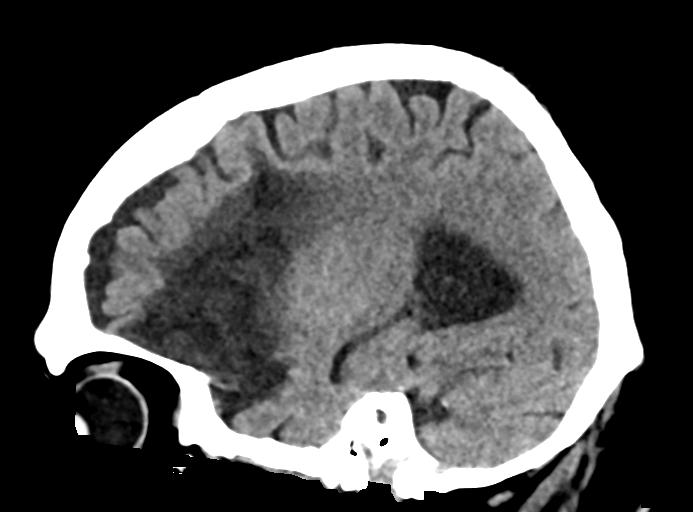

[16 of 47 positions shown; findings below may reference images not displayed]

FINDINGS: Brain: No evidence of acute infarction, hemorrhage, hydrocephalus,
extra-axial collection or mass lesion/mass effect. Generalized brain
atrophy. Remote left frontal and anterior insular infarct which is
sizable, anterior division MCA territory.

Vascular: No hyperdense vessel or unexpected calcification.

Skull: Normal. Negative for fracture or focal lesion.

Sinuses/Orbits: Partial right mastoid and sphenoid sinus
opacification with sclerosis from chronic inflammation.
IMPRESSION: 1. No acute finding.
2. Brain atrophy and remote left MCA branch infarct.

## 2021-04-14 IMAGING — DX DG CHEST 1V PORT
1 series · 1 of 1 positions shown · non-contrast
Comparison: [DATE]

CLINICAL DATA: Confusion

EXAM:
PORTABLE CHEST 1 VIEW

[chest ap]
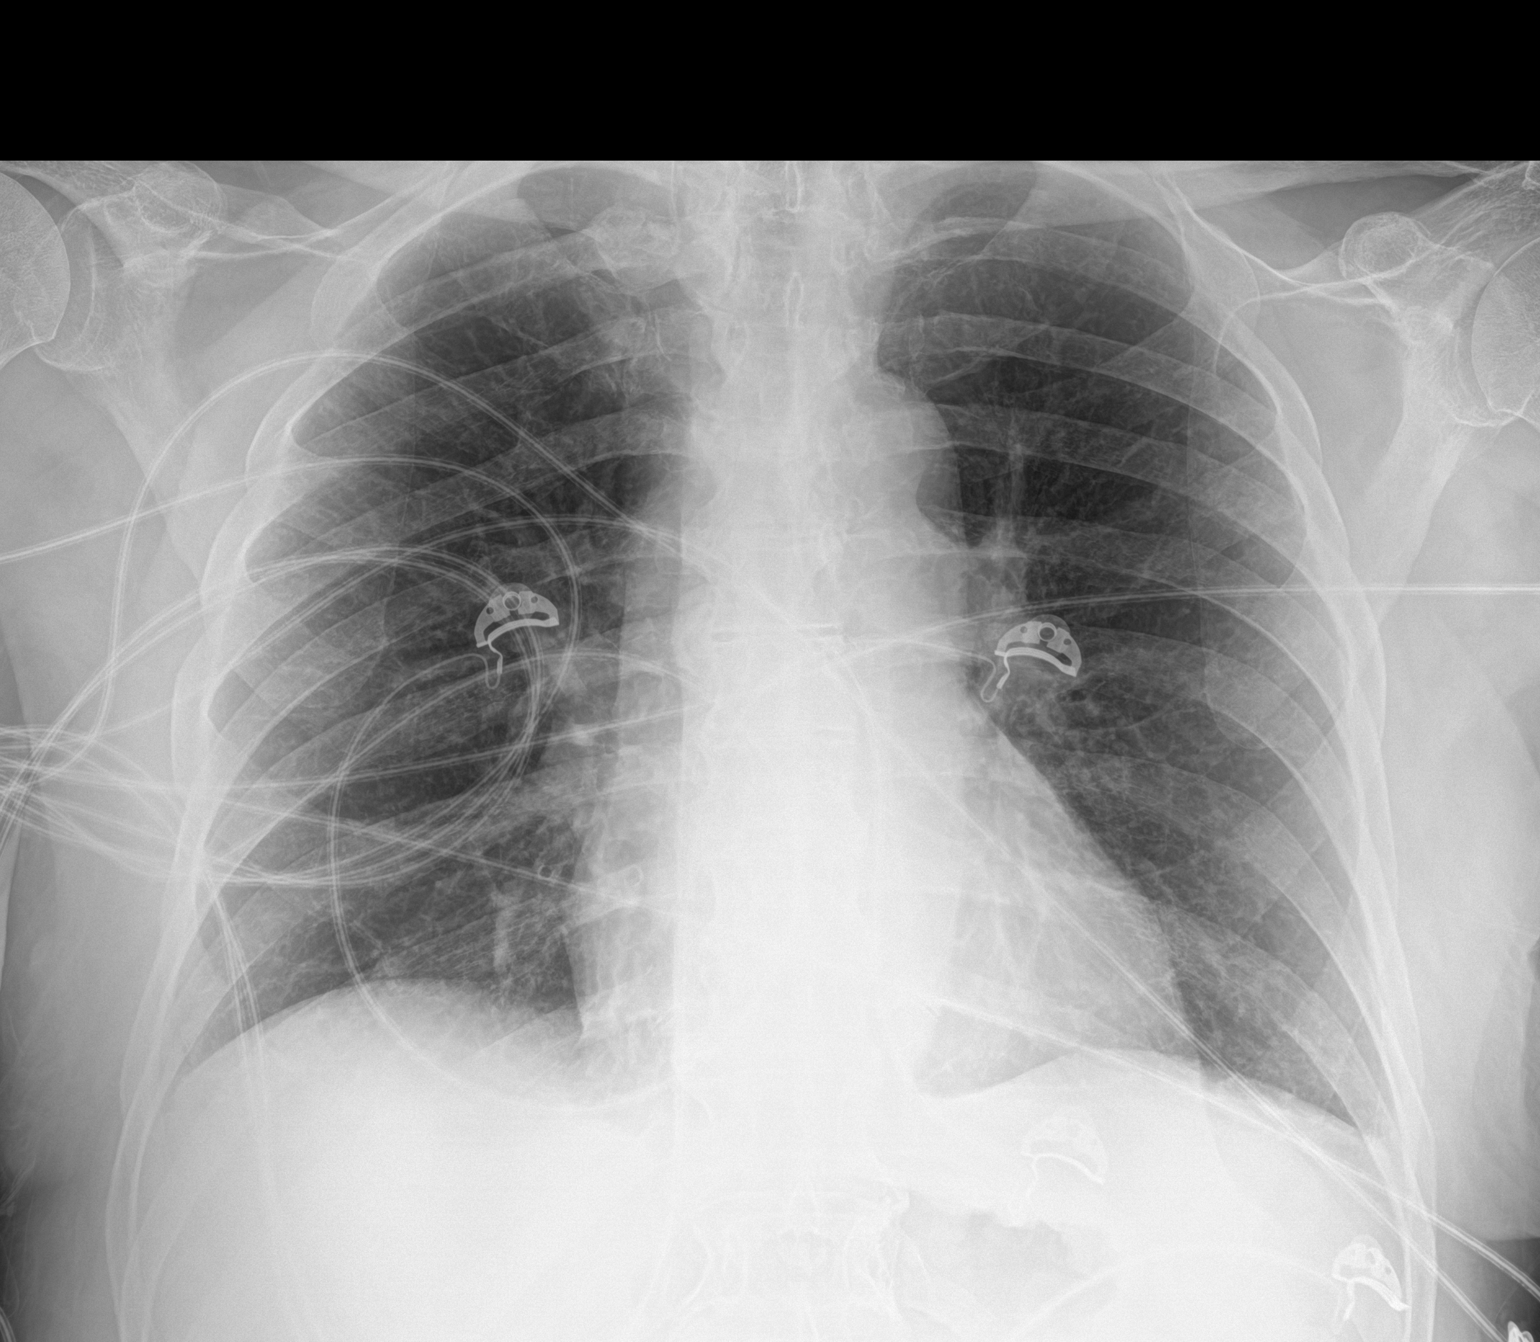

[1 of 1 positions shown; findings below may reference images not displayed]

FINDINGS: The heart size and mediastinal contours are within normal limits.
Both lungs are clear. The visualized skeletal structures are
unremarkable.
IMPRESSION: No acute abnormality of the lungs in AP portable projection.

## 2021-04-14 IMAGING — MR MR HEAD W/O CM
12 of 13 series · 44 of 48 positions shown · non-contrast
Comparison: Head CT [DATE] and MRI [DATE]

CLINICAL DATA: Mental status change, unknown cause.

EXAM:
MRI HEAD WITHOUT CONTRAST
TECHNIQUE: Multiplanar, multiecho pulse sequences of the brain and surrounding
structures were obtained without intravenous contrast.

[Series 5: DWI · axial · 3.0mm · 0.88mm/px · z∈[-104,+53]mm · 8 of 108 slices shown (1 of 4)]
[im 1/108]
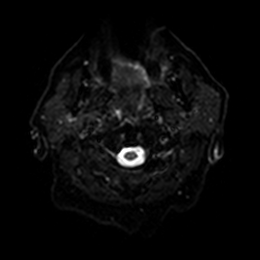
[im 16/108]
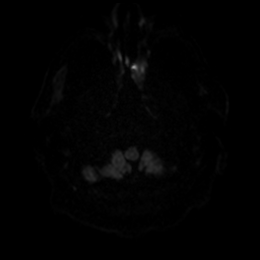
[im 31/108]
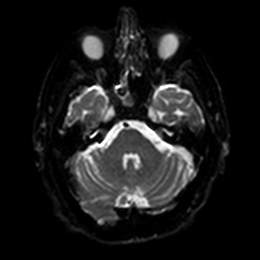
[im 46/108]
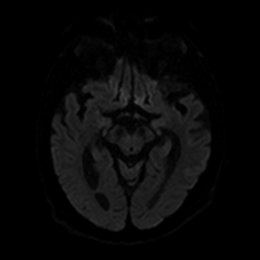
[im 62/108]
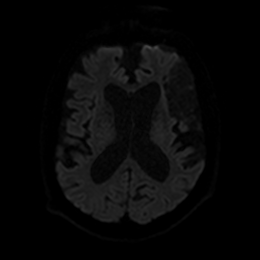
[im 77/108]
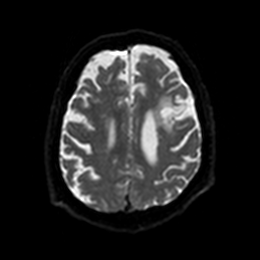
[im 92/108]
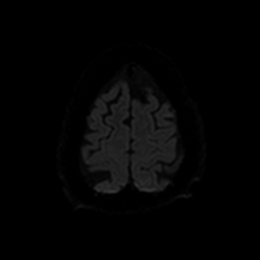
[im 108/108]
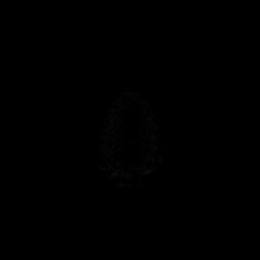

[Series 6: DWI · axial · 3.0mm · 0.88mm/px · z∈[-104,+53]mm · 4 of 54 slices shown (2 of 4)]
[im 1/54]
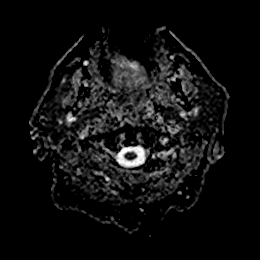
[im 18/54]
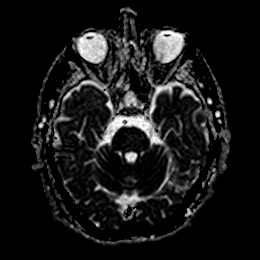
[im 36/54]
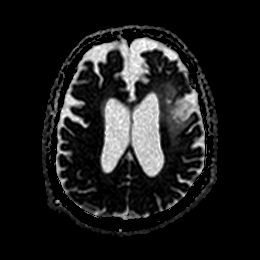
[im 54/54]
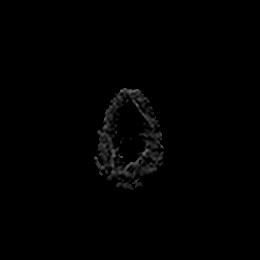

[Series 7: DWI · coronal · 4.0mm · 0.88mm/px · 5 of 74 slices shown (3 of 4)]
[im 1/74]
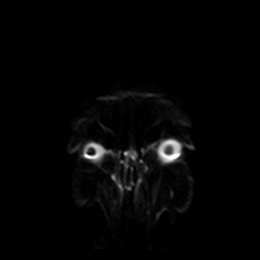
[im 19/74]
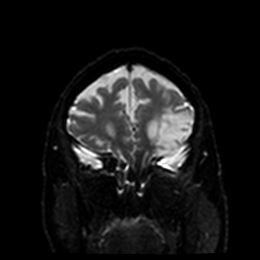
[im 37/74]
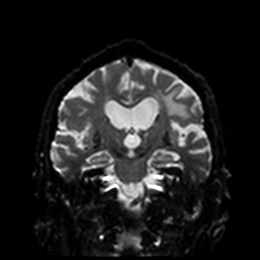
[im 55/74]
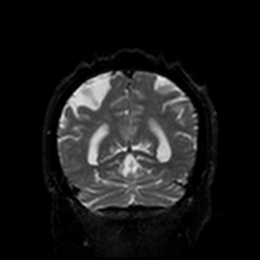
[im 74/74]
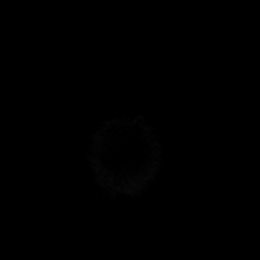

[Series 8: DWI · coronal · 4.0mm · 0.88mm/px · 3 of 37 slices shown (4 of 4)]
[im 1/37]
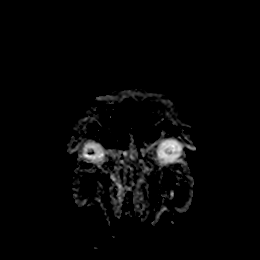
[im 19/37]
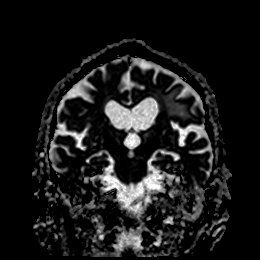
[im 37/37]
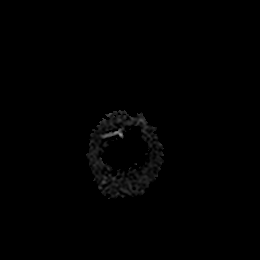

[Series 9: T1 · sagittal · 5.0mm · 0.75mm/px · 2 of 27 slices shown]
[im 1/27]
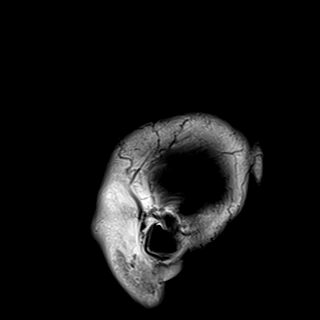
[im 27/27]
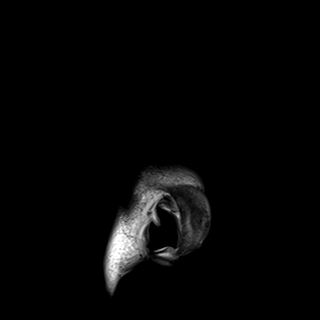

[Series 10: T2 · axial · 5.0mm · 0.72mm/px · z∈[-109,+57]mm · 2 of 29 slices shown (1 of 2)]
[im 1/29]
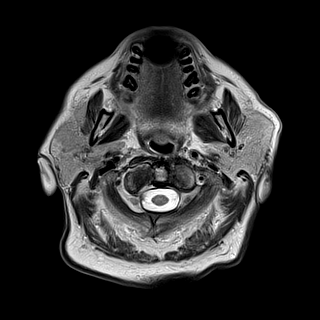
[im 29/29]
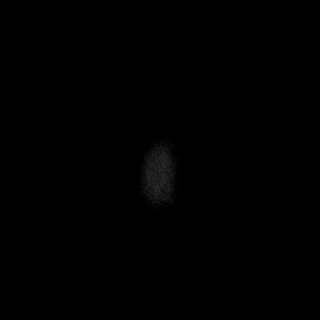

[Series 11: FLAIR · axial · 5.0mm · 0.45mm/px · z∈[-106,+60]mm · 2 of 29 slices shown]
[im 1/29]
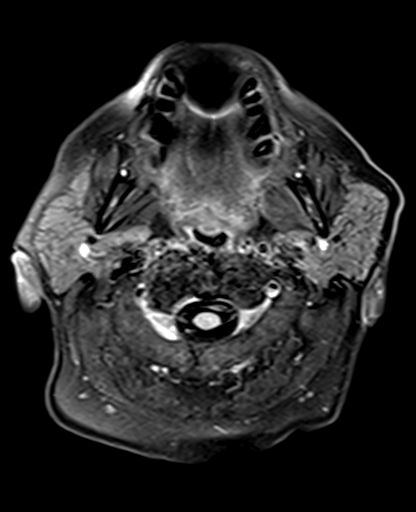
[im 29/29]
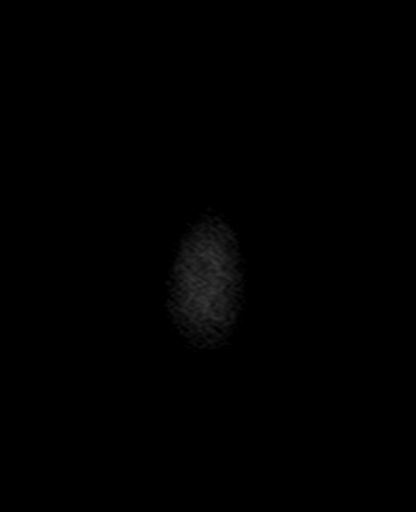

[Series 12: mag_images · axial · 3.0mm · 0.90mm/px · z∈[-116,+58]mm · 4 of 60 slices shown]
[im 1/60]
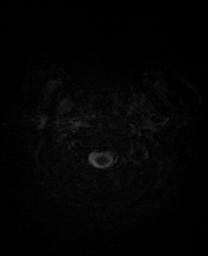
[im 20/60]
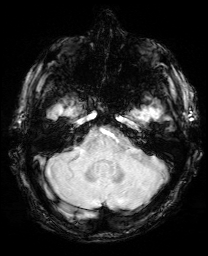
[im 40/60]
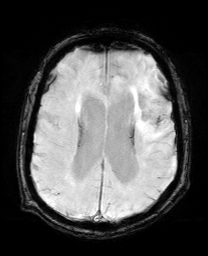
[im 60/60]
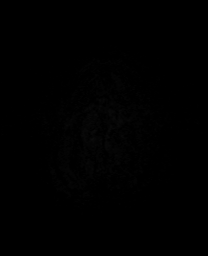

[Series 13: pha_images · axial · 3.0mm · 0.90mm/px · z∈[-116,+52]mm · 4 of 58 slices shown]
[im 1/58]
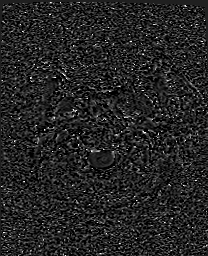
[im 20/58]
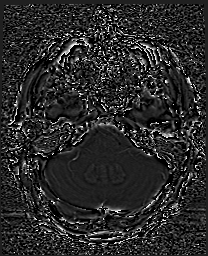
[im 39/58]
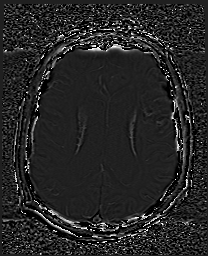
[im 58/58]
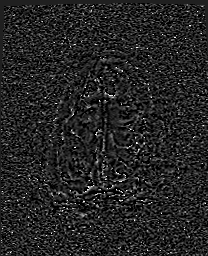

[Series 14: swi_images · axial · 3.0mm · 0.90mm/px · z∈[-116,+58]mm · 4 of 60 slices shown]
[im 1/60]
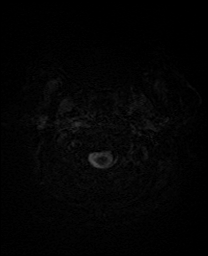
[im 20/60]
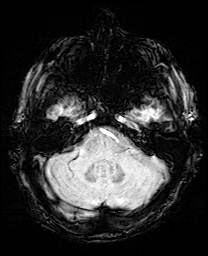
[im 40/60]
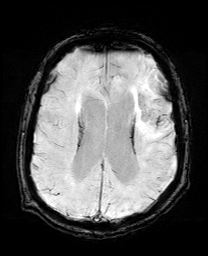
[im 60/60]
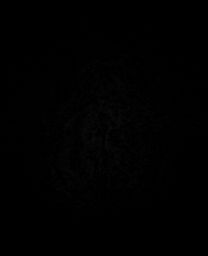

[Series 15: mip_images(sw) · axial · 24.0mm · 0.90mm/px · z∈[-106,+48]mm · 4 of 53 slices shown]
[im 1/53]
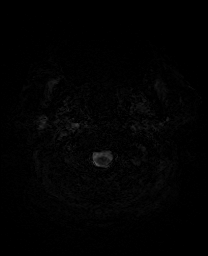
[im 18/53]
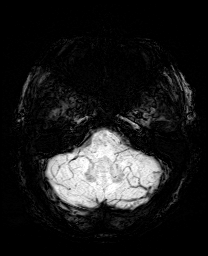
[im 35/53]
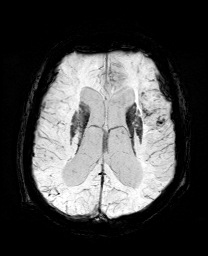
[im 53/53]
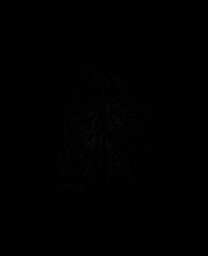

[Series 17: T2 · coronal · 5.0mm · 0.34mm/px · 2 of 33 slices shown (2 of 2)]
[im 1/33]
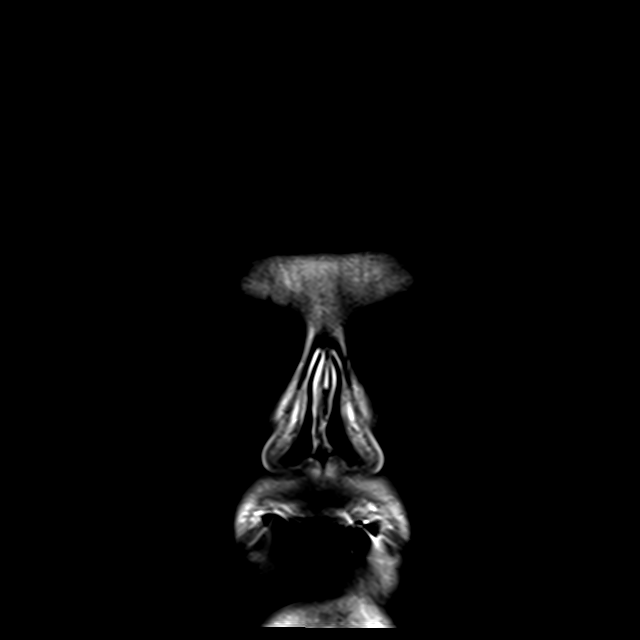
[im 33/33]
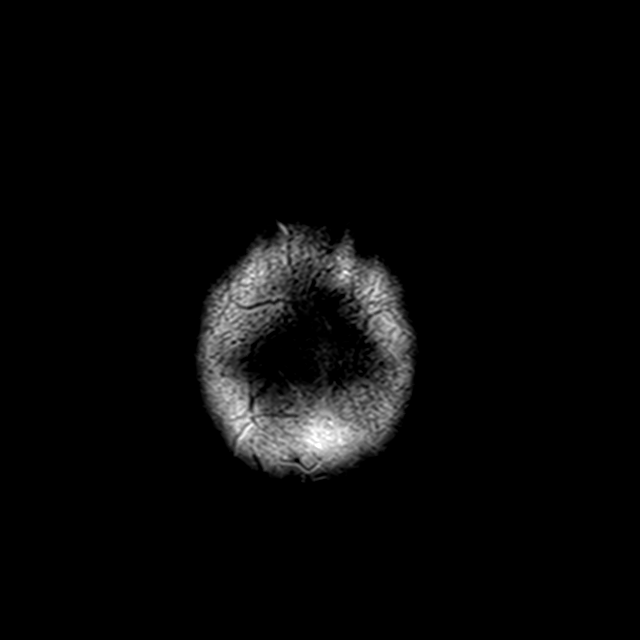

[44 of 48 positions shown; findings below may reference images not displayed]

FINDINGS: Brain: There is no evidence of an acute infarct, mass, midline
shift, or extra-axial fluid collection. There is a moderate-sized
chronic anterior left MCA infarct involving the frontal lobe and
insula with associated chronic hemosiderin deposition. There is
moderate cerebral atrophy.

Vascular: Major intracranial vascular flow voids are preserved.

Skull and upper cervical spine: Unremarkable bone marrow signal.

Sinuses/Orbits: Unremarkable orbits. Mild mucosal thickening in the
paranasal sinuses. Small bilateral mastoid effusions.

Other: None.
IMPRESSION: 1. No acute intracranial abnormality.
2. Chronic left MCA infarct.

## 2021-04-14 MED ORDER — ACETAMINOPHEN 325 MG PO TABS: 650.0000 mg | ORAL_TABLET | Freq: Four times a day (QID) | ORAL | Status: DC | PRN

## 2021-04-14 MED ORDER — DICLOFENAC SODIUM 1 % EX GEL
4.0000 g | Freq: Four times a day (QID) | CUTANEOUS | Status: DC
  Administered 2021-04-14 – 2021-04-15 (×3): 4 g via TOPICAL
  Filled 2021-04-14: qty 100

## 2021-04-14 MED ORDER — ACETAMINOPHEN 650 MG RE SUPP: 650.0000 mg | Freq: Four times a day (QID) | RECTAL | Status: DC | PRN

## 2021-04-14 MED ORDER — COLESTIPOL HCL 1 G PO TABS
1.0000 g | ORAL_TABLET | Freq: Every day | ORAL | Status: DC
  Administered 2021-04-15: 1 g via ORAL
  Filled 2021-04-14: qty 1

## 2021-04-14 MED ORDER — ONDANSETRON HCL 4 MG PO TABS
4.0000 mg | ORAL_TABLET | Freq: Four times a day (QID) | ORAL | Status: DC | PRN
Start: 2021-04-14 — End: 2021-04-15

## 2021-04-14 MED ORDER — ONDANSETRON HCL 4 MG/2ML IJ SOLN: 4.0000 mg | Freq: Four times a day (QID) | INTRAMUSCULAR | Status: DC | PRN

## 2021-04-14 MED ORDER — APIXABAN 2.5 MG PO TABS
2.5000 mg | ORAL_TABLET | Freq: Two times a day (BID) | ORAL | Status: DC
  Administered 2021-04-14 – 2021-04-15 (×3): 2.5 mg via ORAL
  Filled 2021-04-14 (×3): qty 1

## 2021-04-14 MED ORDER — ROSUVASTATIN CALCIUM 20 MG PO TABS
40.0000 mg | ORAL_TABLET | Freq: Every day | ORAL | Status: DC
  Administered 2021-04-15: 40 mg via ORAL
  Filled 2021-04-14: qty 2

## 2021-04-14 MED ORDER — SODIUM CHLORIDE 0.9 % IV BOLUS
500.0000 mL | Freq: Once | INTRAVENOUS | Status: AC
  Administered 2021-04-14: 500 mL via INTRAVENOUS

## 2021-04-14 MED ORDER — POTASSIUM CHLORIDE CRYS ER 20 MEQ PO TBCR
40.0000 meq | EXTENDED_RELEASE_TABLET | Freq: Once | ORAL | Status: AC
  Administered 2021-04-14: 40 meq via ORAL
  Filled 2021-04-14: qty 2

## 2021-04-14 NOTE — Discharge Instructions (Addendum)
You were admitted for altered mental status. CT and MRI did not show stroke. Lab work was not significant for anything major. EEG did not show seizure. You did improve with some IV fluids. Please continue to eat and drink fluids.   Please follow up in 1 week with your PCP to discuss any changes that need to be made to your medication regimen. Also follow up with Dr Benson Norway for colonoscopy.

## 2021-04-14 NOTE — Progress Notes (Signed)
Patient arrived to [place] room 7 alert and oriented x4 pain level 0 bed in lowest position call light in reach.

## 2021-04-14 NOTE — Discharge Summary (Addendum)
Name: Edwin Wall MRN: [id number] DOB: [birth year] 81 y.o. PCP: [name], MD  Date of Admission: 04/14/2021  9:24 AM Date of Discharge:  04/15/21 Attending Physician: Dr. Dareen Piano  DISCHARGE DIAGNOSIS:  Primary Problem: AMS (altered mental status)   Hospital Problems: Principal Problem:   AMS (altered mental status)    DISCHARGE MEDICATIONS:   Allergies as of 04/15/2021   No Known Allergies      Medication List     STOP taking these medications    LORazepam 1 MG tablet Commonly known as: ATIVAN       TAKE these medications    apixaban 2.5 MG Tabs tablet Commonly known as: ELIQUIS Take 1 tablet (2.5 mg total) by mouth 2 (two) times daily. What changed: Another medication with the same name was removed. Continue taking this medication, and follow the directions you see here.   colestipol 1 g tablet Commonly known as: COLESTID Take 1 tablet (1 g total) by mouth 2 (two) times daily. What changed: when to take this   diclofenac Sodium 1 % Gel Commonly known as: VOLTAREN Apply 4 g topically 4 (four) times daily.   furosemide 20 MG tablet Commonly known as: LASIX Take 1 tablet (20 mg total) by mouth daily.   liver oil-zinc oxide 40 % ointment Commonly known as: DESITIN Apply 1 application topically as needed for irritation.   NEO-SYNEPHRINE NA Place 1 spray into the nose daily as needed (congestion).   neomycin-bacitracin-polymyxin ointment Commonly known as: NEOSPORIN Apply 1 application topically as needed for wound care.   rosuvastatin 40 MG tablet Commonly known as: CRESTOR Take 1 tablet (40 mg total) by mouth daily.   trolamine salicylate 10 % cream Commonly known as: ASPERCREME Apply 1 application topically 2 (two) times daily as needed for muscle pain.               Durable Medical Equipment  (From admission, onward)           Start     Ordered   04/14/21 1702  For home use only DME Walker rolling  Once        Question Answer Comment  Walker: With 5 Inch Wheels   Patient needs a walker to treat with the following condition Weakness      04/14/21 1704   04/14/21 1702  For home use only DME 3 n 1  Once        04/14/21 1704            DISPOSITION AND FOLLOW-UP:  Edwin Wall was discharged from [facility] in stable condition. At the hospital follow up visit please address:  Follow-up Recommendations: Consults: none  Labs: BMP in 1 week  Studies: Cognitive evaluation at hospital follow up visit   Medications: Do not take Lorazepam if you have any left at home   Follow-up Appointments:  Follow-up Information     [name], MD. Go in 1 week(s).   Specialty: Family Medicine Why: make appt to see your PCP in 1 week for a posit hospital visit. Contact information: [place] [address] [phone]         Sueanne Margarita, MD .   Specialty: Cardiology Contact information: [phone] [address] [place] [address] [phone]         Massie Bougie, MD. Call today.   Why: to see if it is time to follow up for colonoscopy Contact information: [address]  [place] [phone]                 HOSPITAL COURSE:  Patient Summary: Altered mental status  ?AKI  Found to be altered on morning of 2/12, but was at his baseline the prior night. Initial ED workup largely reassuring. CTH and CXR negative. CBC completely unremarkable. CMP with K 2.9 and Cr 1.76 (baseline 1.6). Received 40 Kdur. UA pending. MRI ordered by EDP and pending at time of admission. Is interactive, following commands and A&Ox3 on my initial exam. Pt and wife report that he has returned to his baseline. Not on any centrally acting medications and no recent medication changes. Previously prescribed Lorazepam for anxiety, though wife reports he has not used for >6 months. UDS negative for benzo. No recent falls or trauma, and imaging reassuring.  Vitals stable and pt afebrile; low suspicion for infection. UA not impressive. Low suspicion for post icital state given no witnessed seizure like activity. EEG with mild generalized nonspecific cerebral encephalopathy; no seizure or seizure predisposition noted. Cr up 1.76 on admission, baseline is difficult to assess but appears close to 1.6; received 1/2L IVF in ED and is back to his baseline at time of admission. Concerned for possible urinary retention, bladder scan with 80cc. MRI brain resulted showing no acute intracranial pathology. Lab work next morning with AM glucose 88 and Cr 1.82 (baseline 1.6). Gave him another 1/2L IVF, I suspect he was dehydrated. Pt is near his baseline and stable for discharge. He is able to tell me his birth date but not his age. PT recommended home health PT but family declined. Pt will need a cognitive evaluation at hospital follow up visit. Please address this at next PCP visit. Also follow up with a BMP.    Hypokalemia  K 2.9. 40 Kdur given in ED.  - Trend BMP - Replete K as needed  K 3.1 on morning of discharge; 40 of Kdur given. Follow up BMET in 1 week.    Persistent A fib  Rate controlled. Follows in the afib clinic. 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 03/11/21 LHC showing Mild-moderate, non-obstructive coronary artery disease. No signs or symptoms of fluid overload and is euvolemic on exam.   Weight loss  40 lbs weight loss since 08/2020. Wife reports that pt did not eat well while in CIR after his stroke as he did not like the food. They also report that he has been actively trying to lose weight as he gained weight earlier last year.    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  He was last seen by  oncology in 2018 following his sigmoidectomy. He did not require neoadjuvant chemotherapy and was recommended to have serial CEA levels checked every 6 months x 2 years then yearly up to 5 years from the diagnosis. He was recommended to have this done with his PCP and did not require further oncology follow-up. CEA obtained during hospitalization in November (2 mo ago) was 2.2 (previously 4.5, 4 years ago). Patient does report following regularly with his gastroenterologist Dr. Benson Norway for colonoscopies.  - Continue OP follow up with Dr Benson Norway and PCP    HTN Stable and well controlled. Not on any anti-hypertensive agents.    History of  L MCA Occurred in setting of new onset atrial fibrillation. Right hemiparesis that has improved with rehab. No new focal deficits.  -Continue Eliquis as above  -Continue Crestor 40     T2DM A1c 6.2. - SSI - CBG monitoring    Arthritis  - Voltaren gel    DISCHARGE INSTRUCTIONS:   Discharge Instructions     Call MD for:  difficulty breathing, headache or visual disturbances   Complete by: As directed    Call MD for:  extreme fatigue   Complete by: As directed    Call MD for:  hives   Complete by: As directed    Call MD for:  persistant dizziness or light-headedness   Complete by: As directed    Call MD for:  persistant nausea and vomiting   Complete by: As directed    Call MD for:  redness, tenderness, or signs of infection (pain, swelling, redness, odor or green/yellow discharge around incision site)   Complete by: As directed    Call MD for:  severe uncontrolled pain   Complete by: As directed    Call MD for:  temperature >100.4   Complete by: As directed    Diet - low sodium heart healthy   Complete by: As directed    Increase activity slowly   Complete by: As directed        SUBJECTIVE:  No acute overnight events.    Patient was seen at bedside during rounds today. Pt reports feeling well. He denies feeling confused. Reports feeling at  his baseline. Has no CP or SHOB.    Pt is updated on the plan for today, and all questions and concerns are addressed.   Discharge Vitals:   BP 131/69 (BP Location: Left Arm)    Pulse 85    Temp 98.3 F (36.8 C) (Oral)    Resp 16    Ht 5\' 8"  (1.727 m)    Wt 86.2 kg    SpO2 98%    BMI 28.89 kg/m   OBJECTIVE:  Constitutional: alert, well-appearing, in NAD  HENT: normocephalic, atraumatic, mucous membranes moist Eyes: conjunctiva non-erythematous, EOMI Cardiovascular: Irregularly irregular, trace pitting edema of BLE Pulmonary/Chest: Non labored breathing on RA, CTAB, no wheezing or rales  Abdominal: soft, non-tender to palpation, non-distended Neurological: Strength 5/5 BUE/BLE, alert and oriented to person and place  Psych: normal behavior, normal affect   Pertinent Labs, Studies, and Procedures:  CBC Latest Ref Rng & Units 04/15/2021 04/14/2021 03/21/2021  WBC 4.0 - 10.5 K/uL 7.8 5.3 7.1  Hemoglobin 13.0 - 17.0 g/dL 12.8(L) 15.1 14.0  Hematocrit 39.0 - 52.0 % 38.5(L) 45.9 44.7  Platelets 150 - 400 K/uL 170 208 227    CMP Latest Ref Rng & Units 04/15/2021 04/14/2021 04/14/2021  Glucose 70 - 99 mg/dL 88 149(H) 113(H)  BUN 8 - 23 mg/dL 19 17 13   Creatinine 0.61 - 1.24 mg/dL 1.82(H) 1.86(H) 1.76(H)  Sodium 135 - 145 mmol/L 140 137 142  Potassium 3.5 - 5.1 mmol/L 3.1(L) 3.7 2.9(L)  Chloride 98 - 111 mmol/L 106 104 103  CO2 22 - 32 mmol/L 25 24 27   Calcium 8.9 - 10.3 mg/dL 8.6(L) 8.4(L) 9.3  Total Protein 6.5 - 8.1 g/dL - - 6.5  Total Bilirubin 0.3 - 1.2 mg/dL - - 1.3(H)  Alkaline Phos 38 - 126 U/L - - 86  AST 15 - 41 U/L - - 19  ALT 0 - 44 U/L - - 13    CT Head Wo  Contrast  Result Date: 04/14/2021 CLINICAL DATA:  Mental status change with unknown cause EXAM: CT HEAD WITHOUT CONTRAST TECHNIQUE: Contiguous axial images were obtained from the base of the skull through the vertex without intravenous contrast. RADIATION DOSE REDUCTION: This exam was performed according to the  departmental dose-optimization program which includes automated exposure control, adjustment of the mA and/or kV according to patient size and/or use of iterative reconstruction technique. COMPARISON:  01/16/2021 FINDINGS: Brain: No evidence of acute infarction, hemorrhage, hydrocephalus, extra-axial collection or mass lesion/mass effect. Generalized brain atrophy. Remote left frontal and anterior insular infarct which is sizable, anterior division MCA territory. Vascular: No hyperdense vessel or unexpected calcification. Skull: Normal. Negative for fracture or focal lesion. Sinuses/Orbits: Partial right mastoid and sphenoid sinus opacification with sclerosis from chronic inflammation. IMPRESSION: 1. No acute finding. 2. Brain atrophy and remote left MCA branch infarct. Electronically Signed   By: Jorje Guild M.D.   On: 04/14/2021 10:47   MR BRAIN WO CONTRAST  Result Date: 04/14/2021 CLINICAL DATA:  Mental status change, unknown cause. EXAM: MRI HEAD WITHOUT CONTRAST TECHNIQUE: Multiplanar, multiecho pulse sequences of the brain and surrounding structures were obtained without intravenous contrast. COMPARISON:  Head CT 04/14/2021 and MRI 07/15/2020 FINDINGS: Brain: There is no evidence of an acute infarct, mass, midline shift, or extra-axial fluid collection. There is a moderate-sized chronic anterior left MCA infarct involving the frontal lobe and insula with associated chronic hemosiderin deposition. There is moderate cerebral atrophy. Vascular: Major intracranial vascular flow voids are preserved. Skull and upper cervical spine: Unremarkable bone marrow signal. Sinuses/Orbits: Unremarkable orbits. Mild mucosal thickening in the paranasal sinuses. Small bilateral mastoid effusions. Other: None. IMPRESSION: 1. No acute intracranial abnormality. 2. Chronic left MCA infarct. Electronically Signed   By: Logan Bores M.D.   On: 04/14/2021 14:55   DG Chest Port 1 View  Result Date: 04/14/2021 CLINICAL DATA:   Confusion EXAM: PORTABLE CHEST 1 VIEW COMPARISON:  01/16/2021 FINDINGS: The heart size and mediastinal contours are within normal limits. Both lungs are clear. The visualized skeletal structures are unremarkable. IMPRESSION: No acute abnormality of the lungs in AP portable projection. Electronically Signed   By: Delanna Ahmadi M.D.   On: 04/14/2021 10:22      Lajean Manes, MD Internal Medicine Resident

## 2021-04-14 NOTE — H&P (Addendum)
Date: 04/14/2021               Patient Name:  Edwin Wall MRN: [id number]  DOB: 1940-09-01 Age / Sex: 81 y.o., male   PCP: [name], MD         Medical Service: Internal Medicine Teaching Service         Attending Physician: Dr. Dareen Piano     First Contact: Lajean Manes, MD Pager: [phone]  Second Contact: Rick Duff, MD Pager: [phone]       After Hours (After 5p/  First Contact Pager: [phone]  weekends / holidays): Second Contact Pager: [phone]    Chief Complaint: AMS   History of Present Illness:  Edwin Wall is a 81 y.o. 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   Wife is at bedside. Wife reports that the pt was difficult to arouse this morning, and appeared confused. Wife reports that he was responsive to stimuli but confused. The patient is alert and oriented to person, place and time at this time. He is unable to tell me why he is here. He recalls none of the events the wife described to me. The patient denies any complaints at this time. Wife reports that he is back to his baseline at thie time.   Pt denies fevers, chills, nausea, vomiting, diarrhea, constipation, chest pain, SHOB, dark stools, bloody stools, or dysuria. He does report difficulty urinating at this time, but was able to go this morning without any trouble. Wife denies any falls, syncopal episodes, recent trauma, whole-body shaking, or slurred speech. Reports to be eating and drinking fine.  Initial ED workup largely reassuring. CTH and CXR negative. CBC completely unremarkable. CMP with K 2.9 and Cr 1.76 (baseline 1.6). Received 40 Kdur. UA pending. MRI ordered by  EDP and pending at this time.   Meds:  No current facility-administered medications on file prior to encounter.   Current Outpatient Medications on File Prior to Encounter  Medication Sig Dispense Refill   apixaban (ELIQUIS) 5 MG TABS tablet Take 2.5 mg by mouth 2 (two) times daily.     colestipol (COLESTID) 1 g tablet Take 1 tablet (1 g total) by mouth 2 (two) times daily. (Patient taking differently: Take 1 g by mouth daily.) 30 tablet 0   furosemide (LASIX) 20 MG tablet Take 1 tablet (20 mg total) by mouth daily. 90 tablet 3   liver oil-zinc oxide (DESITIN) 40 % ointment Apply 1 application topically as needed for irritation.     LORazepam (ATIVAN) 1 MG tablet Take 1 mg by mouth 2 (two) times daily as needed for anxiety.     neomycin-bacitracin-polymyxin (NEOSPORIN) ointment Apply 1 application topically as needed for wound care.     Phenylephrine HCl (NEO-SYNEPHRINE NA) Place 1 spray into the nose daily as needed (congestion).     trolamine salicylate (ASPERCREME) 10 % cream Apply 1 application topically 2 (two) times daily as needed for muscle pain.     apixaban (ELIQUIS) 2.5 MG TABS tablet Take 1 tablet (2.5 mg total) by mouth 2 (two) times daily. (Patient not taking: Reported on 04/01/2021) 180 tablet 3   rosuvastatin (  CRESTOR) 40 MG tablet Take 1 tablet (40 mg total) by mouth daily. (Patient not taking: Reported on 04/14/2021) 30 tablet 0    Allergies: Allergies as of 12/23/2020   (No Known Allergies)   Past Medical History:  Diagnosis Date   Anxiety    Arthritis    Cataract    Colon cancer ([place]) dx'd 11/2016   "stage 3"   Gout    Gout    Hemorrhoids    History of kidney stones    "passed it"   Hypertension    Pre-diabetes    Family History:  Family History  Problem Relation Age of Onset   Cancer Mother        LUNG   COPD Father    Social History:   Lives with wife and son (has severe OCD) Employment- retired.   Tobacco- Denies ever using  EtOH- Denies ever using   Illicit drug use- Denies ever using  IADLs/ADLs- Requires assistance at baseline   Review of Systems: A complete ROS was negative except as per HPI.    Physical Exam: Blood pressure (!) 140/91, pulse 85, temperature 99.1 F (37.3 C), temperature source Oral, resp. rate (!) 28, height 5\' 8"  (1.727 m), weight 95.3 kg, SpO2 99 %. Constitutional: alert, well-appearing, in NAD  HENT: normocephalic, atraumatic, mucous membranes moist Eyes: conjunctiva non-erythematous, EOMI Cardiovascular: Irregularly irregular, trace pitting edema of BLE Pulmonary/Chest: Non labored breathing on RA, CTAB, no wheezing or rales  Abdominal: soft, non-tender to palpation, non-distended Neurological: Strength 5/5 BUE/BLE, alert and oriented to person and place  Psych: normal behavior, normal affect  EKG: atrial fib  CXR: negative  CTH: negative   Assessment & Plan by Problem: Active Problems:   LOC (loss of consciousness) (HCC)  Altered mental status, resolved  ?AKI  Found to be altered this morning after being in his usual state last night. Is interactive, following commands and A&Ox3 on my exam. Pt and wife report that he has returned to his baseline. No recent medication changes. Not on any centrally acting medications, though Lorazepam listed as a home meds, however wife reports he has not used it for >6 months. They do report that their son lives with them, and that it can be a source of stress at times; I wonder if pt did take Lorazepam but cannot recall at this time.  No recent falls or trauma, and imaging reassuring. Vitals stable and pt afebrile; low suspicion for infection at this time but will follow up on UA. Does report Lab work reassuring. Low suspicion for post icital state given no witnessed seizure like activity, but will obtain EEG. Low suspicion for stroke given CT findings and no new focal deficits. Cr up 1.76 on admission, baseline is difficult to assess but appears close to 1.6; received  1/2L IVF in ED and is back to his baseline at this time, could have been dehydrated vs possible urinary retention.  - Follow up on MRI  - Follow up EEG - Follow up on UA  - Follow up on bladder scan - UDS ordered - Trend CBC  - Trend BMP  History of L MCA Occurred in setting of new onset atrial fibrillation. Right hemiparesis that has improved with rehab. No new focal deficits and CT imaging reassuring. - MRI pending -Continue Eliquis as above  -Continue Crestor 40    Hypokalemia  K 2.9. 40 Kdur given in ED - Trend BMP - Replete K as needed   Persistent A fib  Rate controlled. Follows in the afib clinic. 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 03/11/21 LHC showing Mild-moderate, non-obstructive coronary artery disease. No signs or symptoms of fluid overload and is euvolemic on exam.  Weight loss  40 lbs weight loss since 08/2020. Wife reports that pt did not eat well while in CIR after his stroke as he did not like the food. They also report that he has been actively trying to lose weight as he gained weight earlier last year.   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  He was last seen by oncology in 2018 following his sigmoidectomy. He did not require neoadjuvant chemotherapy and was recommended to have serial CEA levels checked every 6 months x 2 years then yearly up to 5 years from the diagnosis. He was recommended to have this done with his PCP and did not require further oncology follow-up. CEA obtained during hospitalization in November (2 mo ago) was 2.2 (previously 4.5, 4 years ago). Patient does report following regularly with his gastroenterologist Dr. Benson Norway for colonoscopies.  - Continue OP follow up with Dr Benson Norway and PCP    HTN Stable and well controlled. Not on any anti-hypertensive agents.   T2DM A1c 6.2. - SSI - CBG monitoring   Arthritis  - Voltaren gel    Best Practice: Diet: Normal IVF: None,None VTE: Apixaban  Code: Full   Lajean Manes, MD  Internal Medicine Resident, PGY-1 Pager: [phone] 11:38 AM, 04/14/2021

## 2021-04-14 NOTE — ED Triage Notes (Signed)
Pt BIB [facility] from home, wife tried to wake him up this AM, reported pt was difficult to wake up and was groggy when he woke up, wife was concerned he was having another stroke. Pt started slowly coming around. Most recent stroke in May, no deficits. Confusion today. Hx afib, currently in afib. 130/80, 96% RA.

## 2021-04-14 NOTE — Progress Notes (Signed)
Patient unavailable for EEG.  Relocating to [phone], per RN.  Will re-attempt

## 2021-04-14 NOTE — Hospital Course (Addendum)
Altered mental status  ?AKI  Found to be altered on morning of 2/12, but was at his baseline the prior night. Initial ED workup largely reassuring. CTH and CXR negative. CBC completely unremarkable. CMP with K 2.9 and Cr 1.76 (baseline 1.6). Received 40 Kdur. UA pending. MRI ordered by EDP and pending at time of admission. Is interactive, following commands and A&Ox3 on my initial exam. Pt and wife report that he has returned to his baseline. Not on any centrally acting medications and no recent medication changes. Previously prescribed Lorazepam for anxiety, though wife reports he has not used for >6 months. UDS negative for benzo. No recent falls or trauma, and imaging reassuring. Vitals stable and pt afebrile; low suspicion for infection. UA not impressive. Low suspicion for post icital state given no witnessed seizure like activity. EEG with mild generalized nonspecific cerebral encephalopathy; no seizure or seizure predisposition noted. Cr up 1.76 on admission, baseline is difficult to assess but appears close to 1.6; received 1/2L IVF in ED and is back to his baseline at time of admission. Concerned for possible urinary retention, bladder scan with 80cc. MRI brain resulted showing no acute intracranial pathology. Lab work next morning with AM glucose 88 and Cr 1.82 (baseline 1.6). Gave him another 1/2L IVF, I suspect he was dehydrated. Pt is near his baseline and stable for discharge. He is able to tell me his birth date but not his age. PT recommended home health PT but family declined. Pt will need a cognitive evaluation at hospital follow up visit. Please address this at next PCP visit. Also follow up with a BMP.    Hypokalemia  K 2.9. 40 Kdur given in ED.  - Trend BMP - Replete K as needed  K 3.1 on morning of discharge; 40 of Kdur given. Follow up BMET in 1 week.    Persistent A fib  Rate controlled. Follows in the afib clinic. 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 03/11/21 LHC showing Mild-moderate, non-obstructive coronary artery disease. No signs or symptoms of fluid overload and is euvolemic on exam.   Weight loss  40 lbs weight loss since 08/2020. Wife reports that pt did not eat well while in CIR after his stroke as he did not like the food. They also report that he has been actively trying to lose weight as he gained weight earlier last year.    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  He was last seen by oncology in 2018 following his sigmoidectomy. He did not require neoadjuvant chemotherapy and was recommended to have serial CEA levels checked every 6 months x 2 years then yearly up to 5 years from the diagnosis. He was recommended to have this done with his PCP and did not require further oncology follow-up. CEA obtained during hospitalization in November (2 mo ago) was 2.2 (previously 4.5, 4 years ago). Patient does report following regularly with his gastroenterologist Dr. Benson Norway for colonoscopies.  - Continue OP follow up with Dr Benson Norway and PCP    HTN Stable and well controlled. Not on any anti-hypertensive agents.    History of L MCA Occurred in setting of new onset atrial fibrillation. Right hemiparesis that has improved with rehab. No new focal deficits.  -Continue Eliquis as above  -  Continue Crestor 40     T2DM A1c 6.2. - SSI - CBG monitoring    Arthritis  - Voltaren gel

## 2021-04-14 NOTE — ED Notes (Signed)
Patient transported to MRI 

## 2021-04-14 NOTE — Evaluation (Signed)
Physical Therapy Evaluation Patient Details Name: Edwin Wall MRN: [id number] DOB: Dec 04, 1940 Today's Date: 04/14/2021  History of Present Illness  Pt is an 81 y.o. male who presented 04/14/21 to the ED with AMS and difficulty arousing. CT and MRI of head negative for acute intracranial abnormality. 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   Clinical Impression  Pt presents with condition above and deficits mentioned below, see PT Problem List. No family present during eval and unable to reach pt's wife via attempts to call her mobile and home phone to confirm home set-up and PLOF. Per chart entries in October and November of 2022, pt was living with his family in a 1-level house with 2-5 STE, functioning independently without an AD. Per chart, pt with residual R sided weakness and aphasia from prior CVA 5/22. Currently, pt displays gross weakness in bil lower extremities (R>L), intact bil lower extremity sensation, cognitive deficits, and balance deficits. Pt did have x1 LOB, requiring minA to recover when ambulating with a RW today. He is at risk for falls. Pt is requiring minA for transfers, min-modA for bed mobility, and min guard-minA for gait with a RW at this time. If family can provide level of care needed, then recommend pt d/c home with HHPT follow-up. Will continue to follow acutely.     Recommendations for follow up therapy are one component of a multi-disciplinary discharge planning process, led by the attending physician.  Recommendations may be updated based on patient status, additional functional criteria and insurance authorization.  Follow Up Recommendations Home health PT    Assistance Recommended at Discharge Frequent or constant Supervision/Assistance  Patient can return home with the following  A little help with walking and/or transfers;A  little help with bathing/dressing/bathroom;Assistance with cooking/housework;Direct supervision/assist for medications management;Direct supervision/assist for financial management;Assist for transportation;Help with stairs or ramp for entrance    Equipment Recommendations Rolling walker (2 wheels);BSC/3in1 (will need to confirm home equipment)  Recommendations for Other Services       Functional Status Assessment Patient has had a recent decline in their functional status and demonstrates the ability to make significant improvements in function in a reasonable and predictable amount of time.     Precautions / Restrictions Precautions Precautions: Fall Precaution Comments: residual aphasia & R weakness prior CVA Restrictions Weight Bearing Restrictions: No      Mobility  Bed Mobility Overal bed mobility: Needs Assistance Bed Mobility: Supine to Sit, Sit to Supine     Supine to sit: Min assist, HOB elevated Sit to supine: Mod assist   General bed mobility comments: Pt needing HHA to ascend trunk to sit up edge of stretcher. ModA to manage legs back onto bed.    Transfers Overall transfer level: Needs assistance Equipment used: Rolling walker (2 wheels), 1 person hand held assist Transfers: Sit to/from Stand Sit to Stand: Min assist           General transfer comment: First sit to stand from stretcher to PT anterior to him and second rep to RW. Pt displaying tendency for R foot to slide anteriorly with transfers, min to steady.    Ambulation/Gait Ambulation/Gait assistance: Min assist, Min guard Gait Distance (Feet): 70 Feet (x2 bouts of ~5 ft > ~70 ft) Assistive device: Rolling walker (2 wheels), 1 person hand held assist Gait Pattern/deviations: Step-through pattern, Decreased step length - right, Decreased stride  length, Trunk flexed Gait velocity: reduced Gait velocity interpretation: <1.31 ft/sec, indicative of household ambulator   General Gait Details: Pt with  slightly flexed trunk and decreased R step length. First gait bout, pt holding onto PT anterior to him for support. Second rep, pt used RW to ambulate down the hall, displaying x1 LOB when turning, needing minA to recover.  Stairs            Wheelchair Mobility    Modified Rankin (Stroke Patients Only) Modified Rankin (Stroke Patients Only) Pre-Morbid Rankin Score: Slight disability Modified Rankin: Moderately severe disability     Balance Overall balance assessment: Needs assistance Sitting-balance support: No upper extremity supported, Feet supported Sitting balance-Leahy Scale: Fair Sitting balance - Comments: Supervision for static sitting edge of stretcher   Standing balance support: Bilateral upper extremity supported, During functional activity, No upper extremity supported Standing balance-Leahy Scale: Fair Standing balance comment: Able to adjust pants without UE support, but trunk sway noted. Reliant on UE support to mobilize                             Pertinent Vitals/Pain Pain Assessment Pain Assessment: Faces Faces Pain Scale: Hurts a little bit Pain Location: generalized grimacing with mobility Pain Descriptors / Indicators: Grimacing Pain Intervention(s): Limited activity within patient's tolerance, Monitored during session, Repositioned    Home Living Family/patient expects to be discharged to:: Private residence Living Arrangements: Spouse/significant other;Children Available Help at Discharge: Family;Available 24 hours/day Type of Home: House Home Access: Stairs to enter Entrance Stairs-Rails: Right;Left;Can reach both Entrance Stairs-Number of Steps: 5 in front of home and 2 in back of home   Home Layout: One level Home Equipment: Cane - single point;Shower Land (2 wheels);Rollator (4 wheels) Additional Comments: Info obtained from prior entry 01/17/21. No family present and unable to reach wife via phone call to her  mobile or home phones.    Prior Function Prior Level of Function : Independent/Modified Independent (Info obtained from prior entries. No family present and unable to reach wife via phone call to her mobile or home phones.)             Mobility Comments: Does not use DME (per entry 12/24/20) ADLs Comments: independent with ADLs. Wife does IADLs and driving (per entry 75/64/33)     Hand Dominance        Extremity/Trunk Assessment   Upper Extremity Assessment Upper Extremity Assessment: Defer to OT evaluation    Lower Extremity Assessment Lower Extremity Assessment: RLE deficits/detail;LLE deficits/detail RLE Deficits / Details: MMT scores of 4- hip flexion, 4+ knee extension, residual weakness from prior CVA; denies numbness/tingling, able to identify location of light touch with precision LLE Deficits / Details: MMT scores of 4 hip flexion, 5 knee extension; denies numbness/tingling, able to identify location of light touch with precision    Cervical / Trunk Assessment Cervical / Trunk Assessment: Normal  Communication   Communication: Expressive difficulties (aphasia from prior CVA)  Cognition Arousal/Alertness: Awake/alert Behavior During Therapy: WFL for tasks assessed/performed Overall Cognitive Status: No family/caregiver present to determine baseline cognitive functioning                                 General Comments: Pt needing repeated cues at times to initiate tasks. Pt distracted, needing redirecting. Forgetful of room even though only ambulated 2 rooms away  General Comments      Exercises     Assessment/Plan    PT Assessment Patient needs continued PT services  PT Problem List Decreased strength;Decreased activity tolerance;Decreased balance;Decreased mobility;Decreased cognition       PT Treatment Interventions DME instruction;Gait training;Stair training;Functional mobility training;Therapeutic activities;Therapeutic  exercise;Balance training;Neuromuscular re-education;Cognitive remediation;Patient/family education    PT Goals (Current goals can be found in the Care Plan section)  Acute Rehab PT Goals Patient Stated Goal: to get better PT Goal Formulation: With patient Time For Goal Achievement: 04/28/21 Potential to Achieve Goals: Good    Frequency Min 3X/week     Co-evaluation               AM-PAC PT "6 Clicks" Mobility  Outcome Measure Help needed turning from your back to your side while in a flat bed without using bedrails?: A Little Help needed moving from lying on your back to sitting on the side of a flat bed without using bedrails?: A Lot Help needed moving to and from a bed to a chair (including a wheelchair)?: A Little Help needed standing up from a chair using your arms (e.g., wheelchair or bedside chair)?: A Little Help needed to walk in hospital room?: A Little Help needed climbing 3-5 steps with a railing? : A Little 6 Click Score: 17    End of Session   Activity Tolerance: Patient tolerated treatment well Patient left: in bed;with call bell/phone within reach   PT Visit Diagnosis: Unsteadiness on feet (R26.81);Other abnormalities of gait and mobility (R26.89);Muscle weakness (generalized) (M62.81);Difficulty in walking, not elsewhere classified (R26.2)    Time: 3007-6226 PT Time Calculation (min) (ACUTE ONLY): 11 min   Charges:   PT Evaluation $PT Eval Moderate Complexity: 1 Mod          Moishe Spice, PT, DPT Acute Rehabilitation Services  Pager: [phone] Office: [phone]   Orvan Falconer 04/14/2021, 4:43 PM

## 2021-04-14 NOTE — Progress Notes (Incomplete)
Subjective: No acute overnight events.   Patient was seen at bedside during rounds today. Pt reports feeling well. He denies feeling confused. Reports feeling at his baseline. Has no CP or SHOB.   Pt is updated on the plan for today, and all questions and concerns are addressed.   Objective:  Vital signs in last 24 hours: Vitals:   04/14/21 1245 04/14/21 1300 04/14/21 1315 04/14/21 1345  BP: (!) 143/97 (!) 123/109 134/87 133/71  Pulse: 79 (!) 103 (!) 38 68  Resp:    17  Temp:      TempSrc:      SpO2: 100% 100% 99% 100%  Weight:      Height:       Constitutional: alert, well-appearing, in NAD  HENT: normocephalic, atraumatic, mucous membranes moist Eyes: conjunctiva non-erythematous, EOMI Cardiovascular: Irregularly irregular, trace pitting edema of BLE Pulmonary/Chest: Non labored breathing on RA, CTAB, no wheezing or rales  Abdominal: soft, non-tender to palpation, non-distended Neurological: Strength 5/5 BUE/BLE, alert and oriented to person and place  Psych: normal behavior, normal affect  Assessment/Plan:  Principal Problem:   AMS (altered mental status)  Altered mental status, resolved  AKI  At his baseline; follows commands and remains A&Ox3. UA largely reassuring. Bladder scan showing 80cc, not retaining. UDS negative for benzo. MRI brain negative for acute intracranial insult. EEG ***. Lab work with glucose 88 and Cr  1.76 (admission) -> 1.82 (today), baseline 1.6. Suspect pt was dehydrated, given rapid sxs improvement with IVF yesterday in the ED.  - 1/2L LR bolus  - Trend BMP   Hypokalemia  K 2.9 -> 3.1 with s/p 40 Kdur. - Trend BMP - Replete K as needed    Persistent A fib  Rate controlled. Follows in the afib clinic. 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 03/11/21 LHC  showing Mild-moderate, non-obstructive coronary artery disease. No signs or symptoms of fluid overload and is euvolemic on exam.   Weight loss  40 lbs weight loss since 08/2020. Wife reports that pt did not eat well while in CIR after his stroke as he did not like the food. They also report that he has been actively trying to lose weight as he gained weight earlier last year.    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  He was last seen by oncology in 2018 following his sigmoidectomy. He did not require neoadjuvant chemotherapy and was recommended to have serial CEA levels checked every 6 months x 2 years then yearly up to 5 years from the diagnosis. He was recommended to have this done with his PCP and did not require further oncology follow-up. CEA obtained during hospitalization in November (2 mo ago) was 2.2 (previously 4.5, 4 years ago). Patient does report following regularly with his gastroenterologist Dr. Benson Norway for colonoscopies.  - Continue OP follow up with Dr Benson Norway and PCP    HTN Stable and well controlled. Not on any anti-hypertensive agents.    History of L MCA Occurred in setting of new onset atrial fibrillation. Right hemiparesis that has improved with rehab. No new focal deficits.  -Continue Eliquis as above  -Continue Crestor 40     T2DM A1c 6.2. - SSI - CBG monitoring    Arthritis  - Voltaren  gel    Best Practice: Diet: Normal IVF: None,None VTE: Apixaban  Code: Full   Lajean Manes, MD  Internal Medicine Resident, PGY-1 Pager: [phone] After 5pm on weekdays and 1pm on weekends: On Call pager [phone]

## 2021-04-14 NOTE — Progress Notes (Signed)
EEG complete - results pending 

## 2021-04-14 NOTE — ED Provider Notes (Signed)
[facility] EMERGENCY DEPARTMENT Provider Note   CSN: [id number] Arrival date & time: 04/14/21  6333     History  Chief Complaint  Patient presents with   Altered Mental Status    Edwin Wall is a 81 y.o. male.  Level 5 caveat secondary to confusion.  81 year old male brought in by EMS from home, difficult to arouse this morning by family and confused.  Patient himself denies any complaints.  He does not know why he is here.  He knows he is at [facility] but thinks it is July.  Denies any headache blurry vision chest pain shortness of breath abdominal pain vomiting diarrhea or urinary symptoms.  No recent falls.  Prior history of stroke patient does not know what his deficits were when this happened.  The history is provided by the patient and the EMS personnel.  Altered Mental Status Presenting symptoms: confusion and disorientation   Most recent episode:  Today Episode history:  Single Timing:  Constant Progression:  Unchanged Chronicity:  New Context: not head injury   Associated symptoms: no abdominal pain, no difficulty breathing, no fever, no headaches, no nausea, no rash, no slurred speech, no visual change, no vomiting and no weakness       Home Medications Prior to Admission medications   Medication Sig Start Date End Date Taking? Authorizing Provider  apixaban (ELIQUIS) 2.5 MG TABS tablet Take 1 tablet (2.5 mg total) by mouth 2 (two) times daily. Patient not taking: Reported on 04/01/2021 12/20/20   Sueanne Margarita, MD  apixaban (ELIQUIS) 5 MG TABS tablet Take 2.5 mg by mouth 2 (two) times daily.    [provider]  colestipol (COLESTID) 1 g tablet Take 1 tablet (1 g total) by mouth 2 (two) times daily. Patient taking differently: Take 1 g by mouth daily. 08/01/20   Love, Ivan Anchors, PA-C  furosemide (LASIX) 20 MG tablet Take 1 tablet (20 mg total) by mouth daily. 03/06/21 06/04/21  Swinyer, Lanice Schwab, NP  hydrocortisone cream 1 % Apply 1  application topically 2 (two) times daily as needed for itching.    [provider]  liver oil-zinc oxide (DESITIN) 40 % ointment Apply 1 application topically as needed for irritation.    [provider]  neomycin-bacitracin-polymyxin (NEOSPORIN) ointment Apply 1 application topically as needed for wound care.    [provider]  Phenylephrine HCl (NEO-SYNEPHRINE NA) Place 1 spray into the nose daily as needed (congestion).    [provider]  rosuvastatin (CRESTOR) 40 MG tablet Take 1 tablet (40 mg total) by mouth daily. 08/01/20   Bary Leriche, PA-C      Allergies    Patient has no known allergies.    Review of Systems   Review of Systems  Constitutional:  Negative for fever.  HENT:  Negative for sore throat.   Eyes:  Negative for visual disturbance.  Respiratory:  Negative for shortness of breath.   Cardiovascular:  Negative for chest pain.  Gastrointestinal:  Negative for abdominal pain, nausea and vomiting.  Genitourinary:  Negative for dysuria.  Musculoskeletal:  Negative for neck pain.  Skin:  Negative for rash.  Neurological:  Negative for weakness and headaches.  Psychiatric/Behavioral:  Positive for confusion.    Physical Exam Updated Vital Signs BP 116/78 (BP Location: Left Arm)    Pulse 85    Temp 97.7 F (36.5 C) (Oral)    Resp 16    Ht 5\' 8"  (1.727 m)  Wt 86.2 kg    SpO2 97%    BMI 28.89 kg/m  Physical Exam Vitals and nursing note reviewed.  Constitutional:      General: He is not in acute distress.    Appearance: Normal appearance. He is well-developed.  HENT:     Head: Normocephalic and atraumatic.  Eyes:     Conjunctiva/sclera: Conjunctivae normal.  Cardiovascular:     Rate and Rhythm: Normal rate and regular rhythm.     Heart sounds: No murmur heard. Pulmonary:     Effort: Pulmonary effort is normal. No respiratory distress.     Breath sounds: Normal breath sounds.  Abdominal:     Palpations: Abdomen is soft.      Tenderness: There is no abdominal tenderness. There is no guarding or rebound.  Musculoskeletal:        General: No swelling.     Cervical back: Neck supple.     Right lower leg: No edema.     Left lower leg: No edema.  Skin:    General: Skin is warm and dry.     Capillary Refill: Capillary refill takes less than 2 seconds.  Neurological:     General: No focal deficit present.     Mental Status: He is alert. He is disoriented.     Cranial Nerves: No cranial nerve deficit.     Sensory: No sensory deficit.     Motor: No weakness.    ED Results / Procedures / Treatments   Labs (all labs ordered are listed, but only abnormal results are displayed) Labs Reviewed  COMPREHENSIVE METABOLIC PANEL - Abnormal; Notable for the following components:      Result Value   Potassium 2.9 (*)    Glucose, Bld 113 (*)    Creatinine, Ser 1.76 (*)    Albumin 3.2 (*)    Total Bilirubin 1.3 (*)    GFR, Estimated 39 (*)    All other components within normal limits  URINALYSIS, ROUTINE W REFLEX MICROSCOPIC - Abnormal; Notable for the following components:   Hgb urine dipstick SMALL (*)    Leukocytes,Ua TRACE (*)    Bacteria, UA RARE (*)    All other components within normal limits  AMMONIA - Abnormal; Notable for the following components:   Ammonia 44 (*)    All other components within normal limits  CBG MONITORING, ED - Abnormal; Notable for the following components:   Glucose-Capillary 106 (*)    All other components within normal limits  RESP PANEL BY RT-PCR (FLU A&B, COVID) ARPGX2  CBC WITH DIFFERENTIAL/PLATELET  ETHANOL  RAPID URINE DRUG SCREEN, HOSP PERFORMED  BASIC METABOLIC PANEL  ACETAMINOPHEN LEVEL  SALICYLATE LEVEL  CBC  BASIC METABOLIC PANEL    EKG EKG Interpretation  Date/Time:  Sunday April 14 2021 10:49:36 EST Ventricular Rate:  68 PR Interval:    QRS Duration: 143 QT Interval:  437 QTC Calculation: 465 R Axis:   -76 Text Interpretation: Atrial fibrillation RBBB  and LAFB No significant change since prior 1/23 Confirmed by Aletta Edouard [phone]) on 04/14/2021 10:52:23 AM  Radiology CT Head Wo Contrast  Result Date: 04/14/2021 CLINICAL DATA:  Mental status change with unknown cause EXAM: CT HEAD WITHOUT CONTRAST TECHNIQUE: Contiguous axial images were obtained from the base of the skull through the vertex without intravenous contrast. RADIATION DOSE REDUCTION: This exam was performed according to the departmental dose-optimization program which includes automated exposure control, adjustment of the mA and/or kV according to patient size and/or use of  iterative reconstruction technique. COMPARISON:  01/16/2021 FINDINGS: Brain: No evidence of acute infarction, hemorrhage, hydrocephalus, extra-axial collection or mass lesion/mass effect. Generalized brain atrophy. Remote left frontal and anterior insular infarct which is sizable, anterior division MCA territory. Vascular: No hyperdense vessel or unexpected calcification. Skull: Normal. Negative for fracture or focal lesion. Sinuses/Orbits: Partial right mastoid and sphenoid sinus opacification with sclerosis from chronic inflammation. IMPRESSION: 1. No acute finding. 2. Brain atrophy and remote left MCA branch infarct. Electronically Signed   By: Jorje Guild M.D.   On: 04/14/2021 10:47   MR BRAIN WO CONTRAST  Result Date: 04/14/2021 CLINICAL DATA:  Mental status change, unknown cause. EXAM: MRI HEAD WITHOUT CONTRAST TECHNIQUE: Multiplanar, multiecho pulse sequences of the brain and surrounding structures were obtained without intravenous contrast. COMPARISON:  Head CT 04/14/2021 and MRI 07/15/2020 FINDINGS: Brain: There is no evidence of an acute infarct, mass, midline shift, or extra-axial fluid collection. There is a moderate-sized chronic anterior left MCA infarct involving the frontal lobe and insula with associated chronic hemosiderin deposition. There is moderate cerebral atrophy. Vascular: Major intracranial  vascular flow voids are preserved. Skull and upper cervical spine: Unremarkable bone marrow signal. Sinuses/Orbits: Unremarkable orbits. Mild mucosal thickening in the paranasal sinuses. Small bilateral mastoid effusions. Other: None. IMPRESSION: 1. No acute intracranial abnormality. 2. Chronic left MCA infarct. Electronically Signed   By: Logan Bores M.D.   On: 04/14/2021 14:55   DG Chest Port 1 View  Result Date: 04/14/2021 CLINICAL DATA:  Confusion EXAM: PORTABLE CHEST 1 VIEW COMPARISON:  01/16/2021 FINDINGS: The heart size and mediastinal contours are within normal limits. Both lungs are clear. The visualized skeletal structures are unremarkable. IMPRESSION: No acute abnormality of the lungs in AP portable projection. Electronically Signed   By: Delanna Ahmadi M.D.   On: 04/14/2021 10:22    Procedures Procedures    Medications Ordered in ED Medications  acetaminophen (TYLENOL) tablet 650 mg (has no administration in time range)    Or  acetaminophen (TYLENOL) suppository 650 mg (has no administration in time range)  ondansetron (ZOFRAN) tablet 4 mg (has no administration in time range)    Or  ondansetron (ZOFRAN) injection 4 mg (has no administration in time range)  apixaban (ELIQUIS) tablet 2.5 mg (2.5 mg Oral Given 04/14/21 1612)  colestipol (COLESTID) tablet 1 g (has no administration in time range)  rosuvastatin (CRESTOR) tablet 40 mg (has no administration in time range)  diclofenac Sodium (VOLTAREN) 1 % topical gel 4 g (4 g Topical Not Given 04/14/21 1400)  potassium chloride SA (KLOR-CON M) CR tablet 40 mEq (40 mEq Oral Given 04/14/21 1219)  sodium chloride 0.9 % bolus 500 mL (0 mLs Intravenous Stopped 04/14/21 1400)    ED Course/ Medical Decision Making/ A&P Clinical Course as of 04/14/21 1724  Sun Apr 14, 2021  1025 Chest x-ray interpreted by me as no acute infiltrates.  Awaiting radiology reading. [MB]  1119 Wife is here now unable to give some more history.  She said he is  usually the first 1 up in the morning but she found him in bed.  She was unable to arouse him.  She called the ambulance and they were able to get him more awake.  She said normally he is oriented and alert although he has some memory loss due to his prior stroke last year. [MB]  1132 Discussed with resident from internal medicine teaching service who will evaluate the patient for admission. [MB]    Clinical Course User Index [  MB] Hayden Rasmussen, MD                           Medical Decision Making Amount and/or Complexity of Data Reviewed Labs: ordered. Radiology: ordered.  Risk Prescription drug management. Decision regarding hospitalization.  Edwin Wall was evaluated in Emergency Department on 04/14/2021 for the symptoms described in the history of present illness. He was evaluated in the context of the global COVID-19 pandemic, which necessitated consideration that the patient might be at risk for infection with the SARS-CoV-2 virus that causes COVID-19. Institutional protocols and algorithms that pertain to the evaluation of patients at risk for COVID-19 are in a state of rapid change based on information released by regulatory bodies including the CDC and federal and state organizations. These policies and algorithms were followed during the patient's care in the ED.  This patient complains of lethargy unresponsiveness; this involves an extensive number of treatment Options and is a complaint that carries with it a high risk of complications and Morbidity. The differential includes stroke, bleed, metabolic derangement, sepsis, Sirs hypoglycemia  I ordered, reviewed and interpreted labs, which included CBC with normal white count, hemoglobin, chemistries with low potassium elevated creatinine mildly elevated, alcohol negative, COVID and flu negative I ordered medication IV fluids oral potassium I ordered imaging studies which included a head chest x-ray and I independently     visualized and interpreted imaging which showed no acute findings Additional history obtained from EMS and patient's wife does not feel he is back to baseline yet. Previous records obtained and reviewed in epic, multiple visits for syncopal events I consulted medicine teaching service resident and discussed lab and imaging findings  Critical Interventions: Work-up and management of patient's altered mental status  After the interventions stated above, I reevaluated the patient and found patient to be symptomatically improving.  Does not sound like he is back to baseline yet.  Will need admission to the hospital for further work-up.  Patient and wife in agreement with plan.  Anticipate likely discharge back home if neurologic status improves.          Final Clinical Impression(s) / ED Diagnoses Final diagnoses:  Altered mental status, unspecified altered mental status type  Hypokalemia    Rx / DC Orders ED Discharge Orders     None         Hayden Rasmussen, MD 04/14/21 1729

## 2021-04-15 DIAGNOSIS — R4182 Altered mental status, unspecified: Secondary | ICD-10-CM | POA: Diagnosis not present

## 2021-04-15 LAB — BASIC METABOLIC PANEL
Anion gap: 9 (ref 5–15)
BUN: 19 mg/dL (ref 8–23)
CO2: 25 mmol/L (ref 22–32)
Calcium: 8.6 mg/dL — ABNORMAL LOW (ref 8.9–10.3)
Chloride: 106 mmol/L (ref 98–111)
Creatinine, Ser: 1.82 mg/dL — ABNORMAL HIGH (ref 0.61–1.24)
GFR, Estimated: 37 mL/min — ABNORMAL LOW (ref >60)
Glucose, Bld: 88 mg/dL (ref 70–99)
Potassium: 3.1 mmol/L — ABNORMAL LOW (ref 3.5–5.1)
Sodium: 140 mmol/L (ref 135–145)

## 2021-04-15 LAB — CBC
HCT: 38.5 % — ABNORMAL LOW (ref 39.0–52.0)
Hemoglobin: 12.8 g/dL — ABNORMAL LOW (ref 13.0–17.0)
MCH: 31.2 pg (ref 26.0–34.0)
MCHC: 33.2 g/dL (ref 30.0–36.0)
MCV: 93.9 fL (ref 80.0–100.0)
Platelets: 170 10*3/uL (ref 150–400)
RBC: 4.1 MIL/uL — ABNORMAL LOW (ref 4.22–5.81)
RDW: 13.7 % (ref 11.5–15.5)
WBC: 7.8 10*3/uL (ref 4.0–10.5)
nRBC: 0 % (ref 0.0–0.2)

## 2021-04-15 MED ORDER — POTASSIUM CHLORIDE CRYS ER 20 MEQ PO TBCR
40.0000 meq | EXTENDED_RELEASE_TABLET | Freq: Once | ORAL | Status: AC
  Administered 2021-04-15: 40 meq via ORAL
  Filled 2021-04-15: qty 2

## 2021-04-15 MED ORDER — DICLOFENAC SODIUM 1 % EX GEL: 4.0000 g | Freq: Four times a day (QID) | CUTANEOUS | 0 refills | Status: DC

## 2021-04-15 MED ORDER — LACTATED RINGERS IV BOLUS
500.0000 mL | Freq: Once | INTRAVENOUS | Status: AC
  Administered 2021-04-15: 500 mL via INTRAVENOUS

## 2021-04-15 NOTE — Progress Notes (Signed)
Explained discharges instructions to patient's wife. All follow up appointments and further medication administration times were reviewed. Assisted with dressing. Patient's IVs and telemetry were already removed prior to my explaining the discharge. All belongings are in his wife's possession. No additional needs expressed.

## 2021-04-15 NOTE — Plan of Care (Signed)
°  Problem: Acute Rehab PT Goals(only PT should resolve) Goal: Pt Will Go Supine/Side To Sit Outcome: Adequate for Discharge Goal: Pt Will Go Sit To Supine/Side Outcome: Adequate for Discharge Goal: Patient Will Transfer Sit To/From Stand Outcome: Adequate for Discharge Goal: Pt Will Transfer Bed To Chair/Chair To Bed Outcome: Adequate for Discharge Goal: Pt Will Ambulate Outcome: Adequate for Discharge Goal: Pt Will Go Up/Down Stairs Outcome: Adequate for Discharge   Problem: Acute Rehab OT Goals (only OT should resolve) Goal: Pt. Will Perform Upper Body Dressing Outcome: Adequate for Discharge Goal: Pt. Will Perform Lower Body Dressing Outcome: Adequate for Discharge Goal: Pt. Will Transfer To Toilet Outcome: Adequate for Discharge

## 2021-04-15 NOTE — Progress Notes (Signed)
°  Date: 04/15/2021  Patient name: Edwin Wall  Medical record number: [id number]  Date of birth: Jul 24, 1940   I have seen and evaluated [place] and discussed their care with the Residency Team. 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  Patient was noted to be a difficult to arouse and confused when his wife tried to wake him up. This resolved on presentation to the ED and patient has remained at baseline mental status since. No CP, no SOB, no abd pain, no n/v, no diarrhea, no fevers/chills, no seizure like activity noted.  Today, patient remains at baseline mental status - oriented to person, place, time but unable to tell us age or why he came to the hospital.  PMHx, Fam Hx, and/or Soc Hx : as per resident admit note  Vitals:   04/15/21 0459 04/15/21 0720  BP: 130/68 131/69  Pulse: 75 85  Resp:  16  Temp: 98.9 F (37.2 C) 98.3 F (36.8 C)  SpO2: 97% 98%   Gen: AAO*3 but unable to tell us age or why he is in the hospital, NAD CVS: RRR, normal heart sounds Lungs:CTA b/l Abd: soft, non tender, non distended, normoactive BS Ext: no edema noted, non tender to palpation Psych: normal mood and affect Skin: warm and dry HEENT:normocephalic, atraumatic Neuro: oriented *3, unable to tell us age or why he is in the hospital, able to do simple calculations  Assessment and Plan: I have seen and evaluated the patient as outlined above. I agree with the formulated Assessment and Plan as detailed in the residents' note, with the following changes:   1. AMS: The etiology behind the patient's AMS remains uncertain but appears to have resolved spontaneously. It is possible that patient was mildly dehydrated on exam evidenced by elevated creatinine from baseline and this could have contributed to his AMS. However, patient's AMS resolved prior to  fluids making this less likely. No signs of underlying infection. No witnessed seizure and EEG did not show seizure activity. No signs of acute CVA on MRI/CT.  -I suspect that patient may have mild cognitive deficits at baseline and should get screened for this at his PCPs office - UDS was within normal limits -OT f/u appreciated - no f/u required -Patient wife refused outpatient PT - No further workup -Patient stable for dc home today   Aldine Contes, MD 2/13/20231:02 PM

## 2021-04-15 NOTE — Progress Notes (Signed)
Pt alert and oriented to person place and disoriented to time and situation.

## 2021-04-15 NOTE — TOC Initial Note (Addendum)
[facility] - Initial/Assessment Note    Patient Details  Name: Edwin Wall MRN: [id number] Date of Birth: 1940-06-03  [facility] CM/SW Contact:    Marilu Favre, RN Phone Number: 04/15/2021, 11:58 AM  Clinical Narrative:                 Spoke to patient, wife and MD at bedside.    Awaiting PT to re evaluate him today. Per MD , and wife OT just saw patient and signed off, await PT new recommendations.   Will need to check on DME also.   1230 Received message from Dr Posey Pronto to arrange OP PT, PT does not need to see patient prior to discharge today. Discussed with patient and wife. They do not feel that patient needs OP PT at this point. If they change their minds after they get home they will call PCP. They already have walker and 3 in 1 at home. Dr Posey Pronto aware.   Expected Discharge Plan: Home/Self Care     Patient Goals and CMS Choice Patient states their goals for this hospitalization and ongoing recovery are:: to go home CMS Medicare.gov Compare Post Acute Care list provided to:: Patient Choice offered to / list presented to : Patient, Spouse  Expected Discharge Plan and Services Expected Discharge Plan: Home/Self Care   Discharge Planning Services: CM Consult   Living arrangements for the past 2 months: Single Family Home                                      Prior Living Arrangements/Services Living arrangements for the past 2 months: Single Family Home Lives with:: Spouse, Adult Children Patient language and need for interpreter reviewed:: Yes Do you feel safe going back to the place where you live?: Yes      Need for Family Participation in Patient Care: Yes (Comment) Care giver support system in place?: Yes (comment)   Criminal Activity/Legal Involvement Pertinent to Current Situation/Hospitalization: No - Comment as needed  Activities of Daily Living      Permission Sought/Granted   Permission granted to  share information with : Yes, Verbal Permission Granted  Share Information with NAME: wife Elon Jester           Emotional Assessment Appearance:: Appears stated age Attitude/Demeanor/Rapport: Engaged Affect (typically observed): Accepting Orientation: : Oriented to Self, Oriented to Place, Oriented to  Time, Oriented to Situation Alcohol / Substance Use: Not Applicable Psych Involvement: No (comment)  Admission diagnosis:  Hypokalemia [E87.6] Altered mental status, unspecified altered mental status type [R41.82] AMS (altered mental status) [R41.82] Patient Active Problem List   Diagnosis Date Noted   AMS (altered mental status) 04/14/2021   Syncope and collapse 03/11/2021   Abnormal nuclear stress test 03/11/2021   Protein-calorie malnutrition, severe 01/18/2021   Orthostatic hypotension    Fall    Weight loss    Secondary hypercoagulable state ([place]) 01/02/2021   LOC (loss of consciousness) ([place]) 12/23/2020   Abnormality of gait 09/10/2020   Leukocytosis    Somnolence    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/2020   Acute right hemiparesis (HCC) 07/19/2020   Left middle cerebral artery stroke ([place]) 07/18/2020   AKI (acute kidney injury) ([place])    Prediabetes    New onset atrial fibrillation (HCC)    Dysphagia    Hyperlipidemia 07/16/2020   Type II diabetes mellitus ([place]) 07/16/2020   Atrial fibrillation (  [place]) 07/16/2020   Acute CVA (cerebrovascular accident) ([place]) 07/15/2020   Cancer of sigmoid colon ([place]) 12/17/2016   Obesity (BMI 30-39.9) 01/15/2011   Hypertension 01/15/2011   History of gout 01/15/2011   ED (erectile dysfunction) 01/15/2011   PCP:  [name], MD Pharmacy:   [facility] [place], [place] [address] Phone: [phone] Fax: [phone]     Social Determinants of Health (SDOH) Interventions    Readmission Risk Interventions No flowsheet data  found.

## 2021-04-15 NOTE — Evaluation (Signed)
Occupational Therapy Evaluation Patient Details Name: Edwin Wall MRN: [id number] DOB: 1940-10-01 Today's Date: 04/15/2021   History of Present Illness Pt is an 81 y.o. male who presented 04/14/21 to the ED with AMS and difficulty arousing. CT and MRI of head negative for acute intracranial abnormality. 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   Clinical Impression   Pt reports independence at home with ADLs/IADLs, uses cane intermittently for mobility. Pt's A & Ox4, follows commands consistently, and is able to recall 2-3 step instruction without repetition. Pt's spouse in room during session, states pt is back to normal self, confirms PLOF and home set up. Pt currently min guard for ADLs, supervision for bed mobility, and min guard for transfers without AD. Pt presenting with impairments listed  below, will follow acutely. Anticipate safe d/c home with assistance.     Recommendations for follow up therapy are one component of a multi-disciplinary discharge planning process, led by the attending physician.  Recommendations may be updated based on patient status, additional functional criteria and insurance authorization.   Follow Up Recommendations  No OT follow up    Assistance Recommended at Discharge Set up Supervision/Assistance  Patient can return home with the following Assistance with cooking/housework;Help with stairs or ramp for entrance    Functional Status Assessment  Patient has had a recent decline in their functional status and demonstrates the ability to make significant improvements in function in a reasonable and predictable amount of time.  Equipment Recommendations  Other (comment);None recommended by OT (pt has all needed DME)    Recommendations for Other Services       Precautions / Restrictions Precautions Precautions:  Fall Precaution Comments: residual aphasia & R weakness prior CVA Restrictions Weight Bearing Restrictions: No      Mobility Bed Mobility Overal bed mobility: Needs Assistance Bed Mobility: Supine to Sit, Sit to Supine     Supine to sit: Supervision Sit to supine: Supervision        Transfers Overall transfer level: Needs assistance Equipment used: 1 person hand held assist Transfers: Sit to/from Stand Sit to Stand: Min guard                  Balance Overall balance assessment: Needs assistance Sitting-balance support: No upper extremity supported, Feet supported Sitting balance-Leahy Scale: Good Sitting balance - Comments: sits unsupported EOB, able to reach outside BOS   Standing balance support: Bilateral upper extremity supported, During functional activity, No upper extremity supported Standing balance-Leahy Scale: Fair Standing balance comment: wihtout AD                           ADL either performed or assessed with clinical judgement   ADL Overall ADL's : Needs assistance/impaired Eating/Feeding: Independent;Sitting   Grooming: Oral care;Wash/dry hands;Wash/dry face;Standing;Set up Grooming Details (indicate cue type and reason): completed standing at sink Upper Body Bathing: Supervision/ safety;Sitting   Lower Body Bathing: Minimal assistance;Sitting/lateral leans   Upper Body Dressing : Supervision/safety;Sitting   Lower Body Dressing: Minimal assistance;Sit to/from stand;Sitting/lateral leans   Toilet Transfer: Min guard;Ambulation;Regular Toilet   Toileting- Water quality scientist and Hygiene: Supervision/safety;Sit to/from stand       Functional mobility during ADLs: Min guard       Vision Baseline Vision/History: 1 Wears glasses Vision Assessment?: No apparent visual deficits Additional Comments: reports wears glasses  for reading     Perception     Praxis      Pertinent Vitals/Pain Pain Assessment Pain Assessment:  No/denies pain     Hand Dominance     Extremity/Trunk Assessment Upper Extremity Assessment Upper Extremity Assessment: Overall WFL for tasks assessed   Lower Extremity Assessment Lower Extremity Assessment: Defer to PT evaluation   Cervical / Trunk Assessment Cervical / Trunk Assessment: Normal   Communication Communication Communication: Expressive difficulties (from prior CVA)   Cognition Arousal/Alertness: Awake/alert Behavior During Therapy: WFL for tasks assessed/performed Overall Cognitive Status: Within Functional Limits for tasks assessed                                 General Comments: follow single step instruction without difficulty, demonstrates good short term memory, able to recall 2 step sequence of activities during sesssion     General Comments  wife present at session, expresses concern with HH due to son with mental illness, unable to have visitors come to house.    Exercises     Shoulder Instructions      Home Living Family/patient expects to be discharged to:: Private residence Living Arrangements: Spouse/significant other;Children Available Help at Discharge: Family;Available 24 hours/day Type of Home: House Home Access: Stairs to enter [employer] of Steps: 5 in front of home and 2 in back of home Entrance Stairs-Rails: Right;Left;Can reach both Home Layout: One level     Bathroom Shower/Tub: Tub/shower unit;Walk-in shower;Door (uses walk in more)   Bathroom Toilet: Handicapped height Bathroom Accessibility: Yes   Home Equipment: [place] - single point;Shower [place] (2 wheels);Rollator (4 wheels);BSC/3in1   Additional Comments: pt verbalizes, wife in room and confirms      Prior Functioning/Environment Prior Level of Function : Independent/Modified Independent             Mobility Comments: uses cane intermittently ADLs Comments: enjoys working out in yard        OT Problem List: Decreased  strength;Decreased range of motion;Decreased activity tolerance;Impaired balance (sitting and/or standing);Decreased safety awareness      OT Treatment/Interventions: Self-care/ADL training;Therapeutic exercise;Balance training;Patient/family education;Energy conservation;Therapeutic activities    OT Goals(Current goals can be found in the care plan section) Acute Rehab OT Goals Patient Stated Goal: to go home OT Goal Formulation: With patient Time For Goal Achievement: 04/29/21 Potential to Achieve Goals: Good ADL Goals Pt Will Perform Upper Body Dressing: with modified independence;sitting Pt Will Perform Lower Body Dressing: with supervision;sit to/from stand;sitting/lateral leans Pt Will Transfer to Toilet: with modified independence;regular height toilet;ambulating  OT Frequency: Min 2X/week    Co-evaluation              AM-PAC OT "6 Clicks" Daily Activity     Outcome Measure Help from another person eating meals?: None Help from another person taking care of personal grooming?: None Help from another person toileting, which includes using toliet, bedpan, or urinal?: A Little Help from another person bathing (including washing, rinsing, drying)?: A Little Help from another person to put on and taking off regular upper body clothing?: None Help from another person to put on and taking off regular lower body clothing?: A Little 6 Click Score: 21   End of Session Nurse Communication: Mobility status  Activity Tolerance: Patient tolerated treatment well Patient left: in bed;with call bell/phone within reach;with bed alarm set;with family/visitor present  OT Visit Diagnosis: Unsteadiness on feet (R26.81);Other abnormalities of gait  and mobility (R26.89);Muscle weakness (generalized) (M62.81)                Time: 1123-1150 OT Time Calculation (min): 27 min Charges:  OT General Charges $OT Visit: 1 Visit OT Evaluation $OT Eval Low Complexity: 1 Low OT Treatments $Self  Care/Home Management : 8-22 mins  Lynnda Child, OTD, OTR/L Acute Rehab [phone]) [phone] - [place] 04/15/2021, 12:48 PM

## 2021-04-15 NOTE — Procedures (Signed)
History: 81 yo M being evaluated for an unresponsive episode.   Sedation: None  Technique: This EEG was acquired with electrodes placed according to the International 10-20 electrode system (including Fp1, Fp2, F3, F4, C3, C4, P3, P4, O1, O2, T3, T4, T5, T6, A1, A2, Fz, Cz, Pz). The following electrodes were missing or displaced: none.   Background: There is a posterior dominant rhythm of 8-9 Hz that is seen at times. In addition, there is mild irregular smoothly contoured generalized delta and theta range activity.    Photic stimulation: Physiologic driving is not performed  EEG Abnormalities: 1) Mild generalized irregular slow activity.   Clinical Interpretation: This EEG is consistent with a mild generalized nonspecific cerebral dysfunciton(encephalopathy). There was no seizure or seizure predisposition recorded on this study. Please note that lack of epileptiform activity on EEG does not preclude the possibility of epilepsy.   Roland Rack, MD [facility] [phone]  If 7pm- 7am, please page neurology on call as listed in [place].

## 2021-04-18 ENCOUNTER — Other Ambulatory Visit: Payer: Self-pay

## 2021-04-18 ENCOUNTER — Ambulatory Visit (INDEPENDENT_AMBULATORY_CARE_PROVIDER_SITE_OTHER): Payer: Medicare Other | Admitting: Cardiology

## 2021-04-18 ENCOUNTER — Other Ambulatory Visit: Payer: Medicare Other

## 2021-04-18 ENCOUNTER — Encounter: Payer: Self-pay | Admitting: Cardiology

## 2021-04-18 VITALS — BP 104/78 | HR 82 | Ht 68.0 in | Wt 187.0 lb

## 2021-04-18 DIAGNOSIS — I10 Essential (primary) hypertension: Secondary | ICD-10-CM

## 2021-04-18 DIAGNOSIS — I639 Cerebral infarction, unspecified: Secondary | ICD-10-CM | POA: Diagnosis not present

## 2021-04-18 DIAGNOSIS — I2583 Coronary atherosclerosis due to lipid rich plaque: Secondary | ICD-10-CM

## 2021-04-18 DIAGNOSIS — E7800 Pure hypercholesterolemia, unspecified: Secondary | ICD-10-CM

## 2021-04-18 DIAGNOSIS — I4819 Other persistent atrial fibrillation: Secondary | ICD-10-CM | POA: Diagnosis not present

## 2021-04-18 DIAGNOSIS — I2510 Atherosclerotic heart disease of native coronary artery without angina pectoris: Secondary | ICD-10-CM | POA: Diagnosis not present

## 2021-04-18 DIAGNOSIS — R600 Localized edema: Secondary | ICD-10-CM | POA: Insufficient documentation

## 2021-04-18 DIAGNOSIS — I428 Other cardiomyopathies: Secondary | ICD-10-CM | POA: Insufficient documentation

## 2021-04-18 NOTE — Patient Instructions (Signed)
Medication Instructions:  Your physician recommends that you continue on your current medications as directed. Please refer to the Current Medication list given to you today.  *If you need a refill on your cardiac medications before your next appointment, please call your pharmacy*   Lab Work: TODAY: FLP, CMET If you have labs (blood work) drawn today and your tests are completely normal, you will receive your results only by: [place] (if you have MyChart) OR A paper copy in the mail If you have any lab test that is abnormal or we need to change your treatment, we will call you to review the results.   Follow-Up: At [facility], you and your health needs are our priority.  As part of our continuing mission to provide you with exceptional heart care, we have created designated Provider Care Teams.  These Care Teams include your primary Cardiologist (physician) and Advanced Practice Providers (APPs -  Physician Assistants and Nurse Practitioners) who all work together to provide you with the care you need, when you need it.  We recommend signing up for the patient portal called "MyChart".  Sign up information is provided on this After Visit Summary.  MyChart is used to connect with patients for Virtual Visits (Telemedicine).  Patients are able to view lab/test results, encounter notes, upcoming appointments, etc.  Non-urgent messages can be sent to your provider as well.   To learn more about what you can do with MyChart, go to [email or web address].    Your next appointment:   6 month(s)  The format for your next appointment:   In Person  Provider:   Fransico Him, MD

## 2021-04-18 NOTE — Addendum Note (Signed)
Addended by: Molli Barrows on: 04/18/2021 03:08 PM   Modules accepted: Orders

## 2021-04-18 NOTE — Addendum Note (Signed)
Addended by: Molli Barrows on: 04/18/2021 02:59 PM   Modules accepted: Orders

## 2021-04-18 NOTE — H&P (View-Only) (Signed)
Cardiology Note    Date:  04/18/2021   ID:  Edwin Wall, DOB 03-Sep-1940, MRN [id number]  PCP:  [name], MD  Cardiologist:  Fransico Him, MD   Chief Complaint  Patient presents with   Coronary Artery Disease   Hyperlipidemia   Atrial Fibrillation   Hypertension   Cardiomyopathy    History of Present Illness:  Edwin Wall is a 81 y.o. male with a history of stage III colon cancer, gout, hypertension, prediabetes and PAF.  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  He was started on apixaban 5 mg twice daily for a CHA2DS2-VASc 2 score of now 5.  2D echocardiogram was done 07/16/2020 showing low normal LV function with EF 50 to 55%, mild left atrial enlargement, mild MR. Repeat 2D echocardiogram 12/24/2020 showed EF 45 to 50% with mild LV enlargement and severe akinesis of the apical anterior septal wall and apical segment.  Left atrium was moderately dilated and right atrium mildly dilated.  There was mild MR.  He was admitted to the hospital in November 2022 with syncope that was felt likely more related to weakness rather than loss of consciousness.  Troponin was 21.  It was felt that his issues were due to weight loss, CVA and cancer not from an arrhythmia.  He underwent nuclear stress test which was intermediate risk with peri-infarct ischemia in the basal to apical inferior wall with EF 30 to 44%.  He underwent cardiac cath on 03/11/2021 which showed mild to moderate nonobstructive coronary disease with mildly elevated LVEDP.  There was a 40% proximal RCA, 20% proximal D1, 30% proximal D2, 40% mid LAD and severe disease in the small D3.  Medical management was recommended.  14-day event monitor was ordered for syncope but it does not appear that this was ever followed through on.    He was seen in A-fib clinic on 03/21/2021 and was in atrial fibrillation.  He is scheduled for cardioversion on  04/22/2021.    He is here today for followup and is doing well.  He denies any chest pain or pressure, SOB, DOE, PND, orthopnea, LE edema, dizziness, palpitations or syncope.  He is compliant with his meds and is tolerating meds with no SE.      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[place])    EF 45-50% by echo 10/22   PAF (paroxysmal atrial fibrillation) ([place])    Pre-diabetes     Past Surgical History:  Procedure Laterality Date   COLECTOMY  12/17/2016   lap; partial sigmoid colectomy/notes 12/17/2016   COLONOSCOPY W/ BIOPSIES AND POLYPECTOMY  11/11/2016   LAPAROSCOPIC SIGMOID COLECTOMY [name] 12/17/2016   Procedure: LAPAROSCOPIC SIGMOID COLECTOMY;  Surgeon: [name], MD;  Location: [facility];  Service: General;  Laterality: [name];   LEFT HEART CATH AND CORONARY ANGIOGRAPHY [name] 03/11/2021   Procedure: LEFT HEART CATH AND CORONARY ANGIOGRAPHY;  Surgeon: [name], MD;  Location: [facility];  Service: Cardiovascular;  Laterality: [name];   NASAL SEPTUM SURGERY     TONSILLECTOMY  Current Medications: Current Meds  Medication Sig   apixaban (ELIQUIS) 2.5 MG TABS tablet Take 1 tablet (2.5 mg total) by mouth 2 (two) times daily.   colestipol (COLESTID) 1 g tablet Take 1 tablet (1 g total) by mouth 2 (two) times daily. (Patient taking differently: Take 1 g by mouth daily.)   diclofenac Sodium (VOLTAREN) 1 % GEL Apply 4 g topically 4 (four) times daily.   furosemide (LASIX) 20 MG tablet Take 1 tablet (20 mg total) by mouth daily.   liver oil-zinc oxide (DESITIN) 40 % ointment Apply 1 application topically as needed for irritation.   neomycin-bacitracin-polymyxin (NEOSPORIN) ointment Apply 1 application topically as needed for wound care.    Phenylephrine HCl (NEO-SYNEPHRINE NA) Place 1 spray into the nose daily as needed (congestion).   rosuvastatin (CRESTOR) 40 MG tablet Take 1 tablet (40 mg total) by mouth daily.   trolamine salicylate (ASPERCREME) 10 % cream Apply 1 application topically 2 (two) times daily as needed for muscle pain.    Allergies:   Patient has no known allergies.   Social History   Socioeconomic History   Marital status: Married    Spouse name: Not on file   Number of children: Not on file   Years of education: Not on file   Highest education level: Not on file  Occupational History   Not on file  Tobacco Use   Smoking status: Never   Smokeless tobacco: Never  Vaping Use   Vaping Use: Never used  Substance and Sexual Activity   Alcohol use: No   Drug use: No   Sexual activity: Never  Other Topics Concern   Not on file  Social History Narrative   Not on file   Social Determinants of Health   Financial Resource Strain: Not on file  Food Insecurity: Not on file  Transportation Needs: Not on file  Physical Activity: Not on file  Stress: Not on file  Social Connections: Not on file     Family History:  The patient's family history includes COPD in his father; Cancer in his mother.   ROS:   Please see the history of present illness.    ROS All other systems reviewed and are negative.  No flowsheet data found.     PHYSICAL EXAM:   VS:  BP 104/78    Pulse 82    Ht 5\' 8"  (1.727 m)    Wt 187 lb (84.8 kg)    SpO2 99%    BMI 28.43 kg/m    GEN: Well nourished, well developed in no acute distress HEENT: Normal NECK: No JVD; No carotid bruits LYMPHATICS: No lymphadenopathy CARDIAC: Irregularly irregular, no murmurs, rubs, gallops RESPIRATORY:  Clear to auscultation without rales, wheezing or rhonchi  ABDOMEN: Soft, non-tender, non-distended MUSCULOSKELETAL:  No edema; No deformity  SKIN: Warm and dry NEUROLOGIC:  Alert and oriented x 3 PSYCHIATRIC:  Normal affect   Wt Readings from  Last 3 Encounters:  04/18/21 187 lb (84.8 kg)  04/14/21 190 lb (86.2 kg)  03/21/21 187 lb 9.6 oz (85.1 kg)      Studies/Labs Reviewed:   EKG:  EKG is not ordered today.  Recent Labs: 01/16/2021: B Natriuretic Peptide 235.2 01/17/2021: Magnesium 2.1; TSH 1.329 04/14/2021: ALT 13 04/15/2021: BUN 19; Creatinine, Ser 1.82; Hemoglobin 12.8; Platelets 170; Potassium 3.1; Sodium 140   Lipid Panel    Component Value Date/Time   CHOL 179 07/16/2020 0337   TRIG 92 07/16/2020 0337   HDL 34 (L)  07/16/2020 0337   CHOLHDL 5.3 07/16/2020 0337   VLDL 18 07/16/2020 0337   LDLCALC 127 (H) 07/16/2020 2094     Additional studies/ records that were reviewed today include:  OV notes from PCP    ASSESSMENT:    1. Persistent atrial fibrillation ([place])   2. Primary hypertension   3. Acute CVA (cerebrovascular accident) ([place])   4. Bilateral leg edema   5. 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 Coronary artery disease due to lipid rich plaque   7. Pure hypercholesterolemia      PLAN:  In order of problems listed above:  Persistent atrial fibrillation -He has been followed in atrial fibrillation clinic -He remains in atrial fibrillation today on exam with heart rate controlled.  He has no cardiac awareness of his arrhythmia -He denies any bleeding problems on DOAC -2D echo showed moderate left atrial enlargement -TSH was normal in May 2022 -Continue prescription drug management with Eliquis 2.5 mg twice daily (dosed for age > 38 and SCr > 1.5) for CHA2DS2-VASc score of 5 with as needed refills -He is scheduled to undergo DCCV next week  2  Hypertension -His BP is well controlled on exam today -He has not required any blood pressure medicine  3.  History of L MCA -occurred in setting of new onset atrial fibrillation -now on Eliquis -per Neuro  4.  LE edema -likely exacerbated by atrial fibrillation -This is been stable with compression hose but still has some on  exam -Diuretics were stopped due to frequent urination at night  5.  Nonischemic dilated cardiomyopathy -May be related to underlying atrial fibrillation although heart rate is well controlled but he also has focal wall motion abnormalities on echo -nuclear stress test done for recent hospitalization for syncope was intermediate risk with peri-infarct ischemia in the basal to apical inferior wall with EF 30 to 44%.   -cardiac cath on 03/11/2021 showed mild to moderate nonobstructive coronary disease with mildly elevated LVEDP.  There was a 40% proximal RCA, 20% proximal D1, 30% proximal D2, 40% mid LAD and severe disease in the small D3.   -2D echo 12/24/2020 showed mild LV dysfunction with EF 45 to 50% with severe akinesis of the apical anterior septal and apical segments. -Cannot use ACE inhibitor/ARB/Entresto due to CKD -BP too soft to add Hydralazine/Imdur/SGTP2   6.  ASCAD -nuclear stress test done for syncope in November 22 was intermediate risk with peri-infarct ischemia in the basal to apical inferior wall with EF 30 to 44%.   -cardiac cath on 03/11/2021 which showed mild to moderate nonobstructive coronary disease with mildly elevated LVEDP.  There was a 40% proximal RCA, 20% proximal D1, 30% proximal D2, 40% mid LAD and severe disease in the small D3.  Medical management was recommended -He denies any anginal symptoms -No aspirin due to DOAC -Continue prescription drug management with statin therapy  7.  Hyperlipidemia -LDL goal less than 70 -Check FLP and CMET -Continue prescription drug management with Crestor 40 mg daily with as needed refills   Medication Adjustments/Labs and Tests Ordered: Current medicines are reviewed at length with the patient today.  Concerns regarding medicines are outlined above.  Medication changes, Labs and Tests ordered today are listed in the Patient Instructions below.  There are no Patient Instructions on file for this visit.   Signed, Fransico Him, MD  04/18/2021 2:56 PM    [address], [place], [place]  [address] Phone: [phone]; Fax: [phone]

## 2021-04-18 NOTE — Progress Notes (Signed)
Cardiology Note    Date:  04/18/2021   ID:  Edwin Wall, DOB [birth year], MRN [id number]  PCP:  [name], MD  Cardiologist:  Fransico Him, MD   Chief Complaint  Patient presents with   Coronary Artery Disease   Hyperlipidemia   Atrial Fibrillation   Hypertension   Cardiomyopathy    History of Present Illness:  Edwin Wall is a 81 y.o. male with a history of stage III colon cancer, gout, hypertension, prediabetes and PAF.  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  He was started on apixaban 5 mg twice daily for a CHA2DS2-VASc 2 score of now 5.  2D echocardiogram was done 07/16/2020 showing low normal LV function with EF 50 to 55%, mild left atrial enlargement, mild MR. Repeat 2D echocardiogram 12/24/2020 showed EF 45 to 50% with mild LV enlargement and severe akinesis of the apical anterior septal wall and apical segment.  Left atrium was moderately dilated and right atrium mildly dilated.  There was mild MR.  He was admitted to the hospital in November 2022 with syncope that was felt likely more related to weakness rather than loss of consciousness.  Troponin was 21.  It was felt that his issues were due to weight loss, CVA and cancer not from an arrhythmia.  He underwent nuclear stress test which was intermediate risk with peri-infarct ischemia in the basal to apical inferior wall with EF 30 to 44%.  He underwent cardiac cath on 03/11/2021 which showed mild to moderate nonobstructive coronary disease with mildly elevated LVEDP.  There was a 40% proximal RCA, 20% proximal D1, 30% proximal D2, 40% mid LAD and severe disease in the small D3.  Medical management was recommended.  14-day event monitor was ordered for syncope but it does not appear that this was ever followed through on.    He was seen in A-fib clinic on 03/21/2021 and was in atrial fibrillation.  He is scheduled for cardioversion on  04/22/2021.    He is here today for followup and is doing well.  He denies any chest pain or pressure, SOB, DOE, PND, orthopnea, LE edema, dizziness, palpitations or syncope.  He is compliant with his meds and is tolerating meds with no SE.      Past Medical History:  Diagnosis Date   Anxiety    Arthritis    Bilateral leg edema    Cataract    Colon cancer ([place]) dx'd 11/2016   "stage 3"   Gout    Gout    Hemorrhoids    History of kidney stones    "passed it"   Hypertension    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[place])    EF 45-50% by echo 10/22   PAF (paroxysmal atrial fibrillation) ([place])    Pre-diabetes     Past Surgical History:  Procedure Laterality Date   COLECTOMY  12/17/2016   lap; partial sigmoid colectomy/notes 12/17/2016   COLONOSCOPY W/ BIOPSIES AND POLYPECTOMY  11/11/2016   LAPAROSCOPIC SIGMOID COLECTOMY [name] 12/17/2016   Procedure: LAPAROSCOPIC SIGMOID COLECTOMY;  Surgeon: [name], MD;  Location: [facility];  Service: General;  Laterality: [name];   LEFT HEART CATH AND CORONARY ANGIOGRAPHY [name] 03/11/2021   Procedure: LEFT HEART CATH AND CORONARY ANGIOGRAPHY;  Surgeon: [name], MD;  Location: [facility];  Service: Cardiovascular;  Laterality: [name];   NASAL SEPTUM SURGERY     TONSILLECTOMY  Current Medications: Current Meds  Medication Sig   apixaban (ELIQUIS) 2.5 MG TABS tablet Take 1 tablet (2.5 mg total) by mouth 2 (two) times daily.   colestipol (COLESTID) 1 g tablet Take 1 tablet (1 g total) by mouth 2 (two) times daily. (Patient taking differently: Take 1 g by mouth daily.)   diclofenac Sodium (VOLTAREN) 1 % GEL Apply 4 g topically 4 (four) times daily.   furosemide (LASIX) 20 MG tablet Take 1 tablet (20 mg total) by mouth daily.   liver oil-zinc oxide (DESITIN) 40 % ointment Apply 1 application topically as needed for irritation.   neomycin-bacitracin-polymyxin (NEOSPORIN) ointment Apply 1 application topically as needed for wound care.    Phenylephrine HCl (NEO-SYNEPHRINE NA) Place 1 spray into the nose daily as needed (congestion).   rosuvastatin (CRESTOR) 40 MG tablet Take 1 tablet (40 mg total) by mouth daily.   trolamine salicylate (ASPERCREME) 10 % cream Apply 1 application topically 2 (two) times daily as needed for muscle pain.    Allergies:   Patient has no known allergies.   Social History   Socioeconomic History   Marital status: Married    Spouse name: Not on file   Number of children: Not on file   Years of education: Not on file   Highest education level: Not on file  Occupational History   Not on file  Tobacco Use   Smoking status: Never   Smokeless tobacco: Never  Vaping Use   Vaping Use: Never used  Substance and Sexual Activity   Alcohol use: No   Drug use: No   Sexual activity: Never  Other Topics Concern   Not on file  Social History Narrative   Not on file   Social Determinants of Health   Financial Resource Strain: Not on file  Food Insecurity: Not on file  Transportation Needs: Not on file  Physical Activity: Not on file  Stress: Not on file  Social Connections: Not on file     Family History:  The patient's family history includes COPD in his father; Cancer in his mother.   ROS:   Please see the history of present illness.    ROS All other systems reviewed and are negative.  No flowsheet data found.     PHYSICAL EXAM:   VS:  BP 104/78    Pulse 82    Ht 5\' 8"  (1.727 m)    Wt 187 lb (84.8 kg)    SpO2 99%    BMI 28.43 kg/m    GEN: Well nourished, well developed in no acute distress HEENT: Normal NECK: No JVD; No carotid bruits LYMPHATICS: No lymphadenopathy CARDIAC: Irregularly irregular, no murmurs, rubs, gallops RESPIRATORY:  Clear to auscultation without rales, wheezing or rhonchi  ABDOMEN: Soft, non-tender, non-distended MUSCULOSKELETAL:  No edema; No deformity  SKIN: Warm and dry NEUROLOGIC:  Alert and oriented x 3 PSYCHIATRIC:  Normal affect   Wt Readings from  Last 3 Encounters:  04/18/21 187 lb (84.8 kg)  04/14/21 190 lb (86.2 kg)  03/21/21 187 lb 9.6 oz (85.1 kg)      Studies/Labs Reviewed:   EKG:  EKG is not ordered today.  Recent Labs: 01/16/2021: B Natriuretic Peptide 235.2 01/17/2021: Magnesium 2.1; TSH 1.329 04/14/2021: ALT 13 04/15/2021: BUN 19; Creatinine, Ser 1.82; Hemoglobin 12.8; Platelets 170; Potassium 3.1; Sodium 140   Lipid Panel    Component Value Date/Time   CHOL 179 07/16/2020 0337   TRIG 92 07/16/2020 0337   HDL 34 (L)  07/16/2020 0337   CHOLHDL 5.3 07/16/2020 0337   VLDL 18 07/16/2020 0337   LDLCALC 127 (H) 07/16/2020 5364     Additional studies/ records that were reviewed today include:  OV notes from PCP    ASSESSMENT:    1. Persistent atrial fibrillation ([place])   2. Primary hypertension   3. Acute CVA (cerebrovascular accident) ([place])   4. Bilateral leg edema   5. 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[place])   6. Coronary artery disease due to lipid rich plaque   7. Pure hypercholesterolemia      PLAN:  In order of problems listed above:  Persistent atrial fibrillation -He has been followed in atrial fibrillation clinic -He remains in atrial fibrillation today on exam with heart rate controlled.  He has no cardiac awareness of his arrhythmia -He denies any bleeding problems on DOAC -2D echo showed moderate left atrial enlargement -TSH was normal in May 2022 -Continue prescription drug management with Eliquis 2.5 mg twice daily (dosed for age > 58 and SCr > 1.5) for CHA2DS2-VASc score of 5 with as needed refills -He is scheduled to undergo DCCV next week  2  Hypertension -His BP is well controlled on exam today -He has not required any blood pressure medicine  3.  History of L MCA -occurred in setting of new onset atrial fibrillation -now on Eliquis -per Neuro  4.  LE edema -likely exacerbated by atrial fibrillation -This is been stable with compression hose but still has some on  exam -Diuretics were stopped due to frequent urination at night  5.  Nonischemic dilated cardiomyopathy -May be related to underlying atrial fibrillation although heart rate is well controlled but he also has focal wall motion abnormalities on echo -nuclear stress test done for recent hospitalization for syncope was intermediate risk with peri-infarct ischemia in the basal to apical inferior wall with EF 30 to 44%.   -cardiac cath on 03/11/2021 showed mild to moderate nonobstructive coronary disease with mildly elevated LVEDP.  There was a 40% proximal RCA, 20% proximal D1, 30% proximal D2, 40% mid LAD and severe disease in the small D3.   -2D echo 12/24/2020 showed mild LV dysfunction with EF 45 to 50% with severe akinesis of the apical anterior septal and apical segments. -Cannot use ACE inhibitor/ARB/Entresto due to CKD -BP too soft to add Hydralazine/Imdur/SGTP2   6.  ASCAD -nuclear stress test done for syncope in November 22 was intermediate risk with peri-infarct ischemia in the basal to apical inferior wall with EF 30 to 44%.   -cardiac cath on 03/11/2021 which showed mild to moderate nonobstructive coronary disease with mildly elevated LVEDP.  There was a 40% proximal RCA, 20% proximal D1, 30% proximal D2, 40% mid LAD and severe disease in the small D3.  Medical management was recommended -He denies any anginal symptoms -No aspirin due to DOAC -Continue prescription drug management with statin therapy  7.  Hyperlipidemia -LDL goal less than 70 -Check FLP and CMET -Continue prescription drug management with Crestor 40 mg daily with as needed refills   Medication Adjustments/Labs and Tests Ordered: Current medicines are reviewed at length with the patient today.  Concerns regarding medicines are outlined above.  Medication changes, Labs and Tests ordered today are listed in the Patient Instructions below.  There are no Patient Instructions on file for this visit.   Signed, Fransico Him, MD  04/18/2021 2:56 PM    [address], [place], [place]  [address] Phone: [phone]; Fax: [phone]

## 2021-04-19 ENCOUNTER — Telehealth: Payer: Self-pay

## 2021-04-19 DIAGNOSIS — E7800 Pure hypercholesterolemia, unspecified: Secondary | ICD-10-CM

## 2021-04-19 DIAGNOSIS — Z79899 Other long term (current) drug therapy: Secondary | ICD-10-CM

## 2021-04-19 LAB — COMPREHENSIVE METABOLIC PANEL
ALT: 7 IU/L (ref 0–44)
AST: 15 IU/L (ref 0–40)
Albumin/Globulin Ratio: 1.5 (ref 1.2–2.2)
Albumin: 3.4 g/dL — ABNORMAL LOW (ref 3.7–4.7)
Alkaline Phosphatase: 90 IU/L (ref 44–121)
BUN/Creatinine Ratio: 15 (ref 10–24)
BUN: 24 mg/dL (ref 8–27)
Bilirubin Total: 0.7 mg/dL (ref 0.0–1.2)
CO2: 26 mmol/L (ref 20–29)
Calcium: 9.1 mg/dL (ref 8.6–10.2)
Chloride: 104 mmol/L (ref 96–106)
Creatinine, Ser: 1.64 mg/dL — ABNORMAL HIGH (ref 0.76–1.27)
Globulin, Total: 2.2 g/dL (ref 1.5–4.5)
Glucose: 101 mg/dL — ABNORMAL HIGH (ref 70–99)
Potassium: 3.7 mmol/L (ref 3.5–5.2)
Sodium: 145 mmol/L — ABNORMAL HIGH (ref 134–144)
Total Protein: 5.6 g/dL — ABNORMAL LOW (ref 6.0–8.5)
eGFR: 42 mL/min/{1.73_m2} — ABNORMAL LOW (ref >59)

## 2021-04-19 LAB — CBC
Hematocrit: 40.6 % (ref 37.5–51.0)
Hemoglobin: 13.3 g/dL (ref 13.0–17.7)
MCH: 30.6 pg (ref 26.6–33.0)
MCHC: 32.8 g/dL (ref 31.5–35.7)
MCV: 94 fL (ref 79–97)
Platelets: 219 10*3/uL (ref 150–450)
RBC: 4.34 x10E6/uL (ref 4.14–5.80)
RDW: 13.3 % (ref 11.6–15.4)
WBC: 5.8 10*3/uL (ref 3.4–10.8)

## 2021-04-19 LAB — LIPID PANEL
Chol/HDL Ratio: 3.5 ratio (ref 0.0–5.0)
Cholesterol, Total: 135 mg/dL (ref 100–199)
HDL: 39 mg/dL — ABNORMAL LOW (ref >39)
LDL Chol Calc (NIH): 80 mg/dL (ref 0–99)
Triglycerides: 82 mg/dL (ref 0–149)
VLDL Cholesterol Cal: 16 mg/dL (ref 5–40)

## 2021-04-19 MED ORDER — EZETIMIBE 10 MG PO TABS: 10.0000 mg | ORAL_TABLET | Freq: Every day | ORAL | 3 refills | Status: DC

## 2021-04-19 MED ORDER — FUROSEMIDE 20 MG PO TABS: 20.0000 mg | ORAL_TABLET | ORAL | 3 refills | Status: DC

## 2021-04-19 NOTE — Telephone Encounter (Signed)
-----   Message from Sueanne Margarita, MD sent at 04/19/2021 10:29 AM EST ----- Serum creatinine mildly elevated.  Change Lasix to 20 mg every other day and repeat bmet in 1 week.  LDL is not at goal at less than 70.  Please add Zetia 10 mg daily and repeat FLP and ALT in 8 weeks

## 2021-04-23 ENCOUNTER — Encounter (HOSPITAL_COMMUNITY)
Admission: RE | Disposition: A | Discharge status: Home / Self Care | Payer: Self-pay | Source: Home / Self Care | Attending: Cardiology

## 2021-04-23 ENCOUNTER — Encounter (HOSPITAL_COMMUNITY): Payer: Self-pay | Admitting: Cardiology

## 2021-04-23 ENCOUNTER — Ambulatory Visit (HOSPITAL_BASED_OUTPATIENT_CLINIC_OR_DEPARTMENT_OTHER): Payer: Medicare Other | Admitting: Certified Registered Nurse Anesthetist

## 2021-04-23 ENCOUNTER — Other Ambulatory Visit: Payer: Self-pay

## 2021-04-23 ENCOUNTER — Ambulatory Visit (HOSPITAL_COMMUNITY)
Admission: RE | Admit: 2021-04-23 | Discharge: 2021-04-23 | Disposition: A | Discharge status: Home / Self Care | Payer: Medicare Other | Attending: Cardiology | Admitting: Cardiology

## 2021-04-23 ENCOUNTER — Ambulatory Visit (HOSPITAL_COMMUNITY): Payer: Medicare Other | Admitting: Certified Registered Nurse Anesthetist

## 2021-04-23 DIAGNOSIS — Z85038 Personal history of other malignant neoplasm of large intestine: Secondary | ICD-10-CM | POA: Insufficient documentation

## 2021-04-23 DIAGNOSIS — I110 Hypertensive heart disease with heart failure: Secondary | ICD-10-CM | POA: Diagnosis not present

## 2021-04-23 DIAGNOSIS — Z8673 Personal history of transient ischemic attack (TIA), and cerebral infarction without residual deficits: Secondary | ICD-10-CM | POA: Diagnosis not present

## 2021-04-23 DIAGNOSIS — I2583 Coronary atherosclerosis due to lipid rich plaque: Secondary | ICD-10-CM | POA: Insufficient documentation

## 2021-04-23 DIAGNOSIS — I1310 Hypertensive heart and chronic kidney disease without heart failure, with stage 1 through stage 4 chronic kidney disease, or unspecified chronic kidney disease: Secondary | ICD-10-CM | POA: Insufficient documentation

## 2021-04-23 DIAGNOSIS — N189 Chronic kidney disease, unspecified: Secondary | ICD-10-CM | POA: Diagnosis not present

## 2021-04-23 DIAGNOSIS — I2510 Atherosclerotic heart disease of native coronary artery without angina pectoris: Secondary | ICD-10-CM | POA: Insufficient documentation

## 2021-04-23 DIAGNOSIS — I420 Dilated cardiomyopathy: Secondary | ICD-10-CM | POA: Insufficient documentation

## 2021-04-23 DIAGNOSIS — I4891 Unspecified atrial fibrillation: Secondary | ICD-10-CM | POA: Diagnosis not present

## 2021-04-23 DIAGNOSIS — E7800 Pure hypercholesterolemia, unspecified: Secondary | ICD-10-CM | POA: Diagnosis not present

## 2021-04-23 DIAGNOSIS — Z7901 Long term (current) use of anticoagulants: Secondary | ICD-10-CM | POA: Diagnosis not present

## 2021-04-23 DIAGNOSIS — E1122 Type 2 diabetes mellitus with diabetic chronic kidney disease: Secondary | ICD-10-CM | POA: Insufficient documentation

## 2021-04-23 DIAGNOSIS — M109 Gout, unspecified: Secondary | ICD-10-CM | POA: Diagnosis not present

## 2021-04-23 DIAGNOSIS — R600 Localized edema: Secondary | ICD-10-CM | POA: Diagnosis not present

## 2021-04-23 DIAGNOSIS — I509 Heart failure, unspecified: Secondary | ICD-10-CM | POA: Diagnosis not present

## 2021-04-23 DIAGNOSIS — I4819 Other persistent atrial fibrillation: Secondary | ICD-10-CM | POA: Diagnosis present

## 2021-04-23 DIAGNOSIS — Z79899 Other long term (current) drug therapy: Secondary | ICD-10-CM | POA: Diagnosis not present

## 2021-04-23 HISTORY — PX: CARDIOVERSION: SHX1299

## 2021-04-23 SURGERY — CARDIOVERSION
Anesthesia: General

## 2021-04-23 MED ORDER — SODIUM CHLORIDE 0.9 % IV SOLN
INTRAVENOUS | Status: DC | PRN
Start: 2021-04-23 — End: 2021-04-23

## 2021-04-23 MED ORDER — SODIUM CHLORIDE 0.9 % IV SOLN: INTRAVENOUS | Status: DC

## 2021-04-23 MED ORDER — PROPOFOL 10 MG/ML IV BOLUS
INTRAVENOUS | Status: DC | PRN
  Administered 2021-04-23: 40 mg via INTRAVENOUS

## 2021-04-23 MED ORDER — LIDOCAINE 2% (20 MG/ML) 5 ML SYRINGE
INTRAMUSCULAR | Status: DC | PRN
  Administered 2021-04-23: 60 mg via INTRAVENOUS

## 2021-04-23 NOTE — Transfer of Care (Signed)
Immediate Anesthesia Transfer of Care Note  Patient: Edwin Wall  Procedure(s) Performed: CARDIOVERSION  Patient Location: Endoscopy Unit  Anesthesia Type:General  Level of Consciousness: awake and drowsy  Airway & Oxygen Therapy: Patient Spontanous Breathing  Post-op Assessment: Report given to RN and Post -op Vital signs reviewed and stable  Post vital signs: Reviewed and stable  Last Vitals:  Vitals Value Taken Time  BP 128/77   Temp    Pulse 63   Resp 18   SpO2 100     Last Pain:  Vitals:   04/23/21 1032  TempSrc: Temporal  PainSc: 0-No pain         Complications: No notable events documented.

## 2021-04-23 NOTE — Telephone Encounter (Signed)
The patient's wife has been notified of the result and verbalized understanding.  All questions (if any) were answered. Antonieta Iba, RN 04/23/2021 5:17 PM  Patient is having BMET done at PCP next Wednesday 3/1.  I have scheduled the patient for FLP/ALT in 6 weeks.

## 2021-04-23 NOTE — Discharge Instructions (Signed)

## 2021-04-23 NOTE — CV Procedure (Signed)
Procedure:   DCCV  Indication:  Symptomatic atrial fibrillation  Procedure Note:  The patient signed informed consent.  They have had had therapeutic anticoagulation with Eliquis greater than 3 weeks.  Anesthesia was administered by Dr. Christella Hartigan and Garrison Columbus, CRNA.  Adequate airway was maintained throughout and vital followed per protocol.  They were cardioverted x 2 with 200J of biphasic synchronized energy.  After initial 200J shock, rhythm appeared to organize into aflutter with variable conduction.  After second 200J shock, converted to sinus rhythm with PACs, rate 60-70s.  The patient had normal neuro status and respiratory status post procedure with vitals stable as recorded elsewhere.    Follow up:  They will continue on current medical therapy and follow up with cardiology as scheduled.  Oswaldo Milian, MD 04/23/2021 11:13 AM

## 2021-04-23 NOTE — Interval H&P Note (Signed)
History and Physical Interval Note:  04/23/2021 11:04 AM  Edwin Wall  has presented today for surgery, with the diagnosis of AFIB.  The various methods of treatment have been discussed with the patient and family. After consideration of risks, benefits and other options for treatment, the patient has consented to  Procedure(s): CARDIOVERSION ([name]) as a surgical intervention.  The patient's history has been reviewed, patient examined, no change in status, stable for surgery.  I have reviewed the patient's chart and labs.  Questions were answered to the patient's satisfaction.     [name]

## 2021-04-23 NOTE — Anesthesia Procedure Notes (Signed)
Procedure Name: General with mask airway Date/Time: 04/23/2021 11:04 AM Performed by: Harden Mo, CRNA Pre-anesthesia Checklist: Patient identified, Emergency Drugs available, Suction available and Patient being monitored Patient Re-evaluated:Patient Re-evaluated prior to induction Oxygen Delivery Method: Ambu bag Preoxygenation: Pre-oxygenation with 100% oxygen Induction Type: IV induction Placement Confirmation: positive ETCO2 and breath sounds checked- equal and bilateral Dental Injury: Teeth and Oropharynx as per pre-operative assessment

## 2021-04-23 NOTE — Anesthesia Preprocedure Evaluation (Signed)
Anesthesia Evaluation  Patient identified by MRN, date of birth, ID band Patient awake    Reviewed: Allergy & Precautions, NPO status , Patient's Chart, lab work & pertinent test results  History of Anesthesia Complications Negative for: history of anesthetic complications  Airway Mallampati: II  TM Distance: >3 FB Neck ROM: Full    Dental   Pulmonary neg pulmonary ROS,    Pulmonary exam normal        Cardiovascular hypertension, + CAD and +CHF  Normal cardiovascular exam+ dysrhythmias Atrial Fibrillation    2D echocardiogram 12/24/2020 showed EF 45 to 50% with mild LV enlargement and severe akinesis of the apical anterior septal wall and apical segment.  Left atrium was moderately dilated and right atrium mildly dilated.  There was mild MR.  He underwent nuclear stress test which was intermediate risk with peri-infarct ischemia in the basal to apical inferior wall with EF 30 to 44%.  He underwent cardiac cath on 03/11/2021 which showed mild to moderate nonobstructive coronary disease with mildly elevated LVEDP.  There was a 40% proximal RCA, 20% proximal D1, 30% proximal D2, 40% mid LAD and severe disease in the small D3.  Medical management was recommended.    Neuro/Psych CVA    GI/Hepatic negative GI ROS, Neg liver ROS,   Endo/Other  diabetes, Type 2  Renal/GU negative Renal ROS  negative genitourinary   Musculoskeletal  (+) Arthritis ,   Abdominal   Peds  Hematology negative hematology ROS (+)   Anesthesia Other Findings   Reproductive/Obstetrics                             Anesthesia Physical Anesthesia Plan  ASA: 3  Anesthesia Plan: General   Post-op Pain Management: Minimal or no pain anticipated   Induction: Intravenous  PONV Risk Score and Plan: 2 and TIVA and Treatment may vary due to age or medical condition  Airway Management Planned: Mask  Additional Equipment:  None  Intra-op Plan:   Post-operative Plan:   Informed Consent: I have reviewed the patients History and Physical, chart, labs and discussed the procedure including the risks, benefits and alternatives for the proposed anesthesia with the patient or authorized representative who has indicated his/her understanding and acceptance.       Plan Discussed with:   Anesthesia Plan Comments:         Anesthesia Quick Evaluation

## 2021-04-23 NOTE — Anesthesia Postprocedure Evaluation (Signed)
Anesthesia Post Note  Patient: Edwin Wall  Procedure(s) Performed: CARDIOVERSION     Patient location during evaluation: Endoscopy Anesthesia Type: General Level of consciousness: awake and alert Pain management: pain level controlled Vital Signs Assessment: post-procedure vital signs reviewed and stable Respiratory status: spontaneous breathing, nonlabored ventilation and respiratory function stable Cardiovascular status: blood pressure returned to baseline and stable Postop Assessment: no apparent nausea or vomiting Anesthetic complications: no   No notable events documented.  Last Vitals:  Vitals:   04/23/21 1136 04/23/21 1146  BP: (!) 119/98 121/65  Pulse: (!) 58 (!) 59  Resp: 13 (!) 24  Temp:    SpO2: 99% 96%    Last Pain:  Vitals:   04/23/21 1146  TempSrc:   PainSc: 0-No pain                 Lidia Collum

## 2021-04-24 ENCOUNTER — Encounter (HOSPITAL_COMMUNITY): Payer: Self-pay | Admitting: Cardiology

## 2021-05-01 ENCOUNTER — Other Ambulatory Visit: Payer: Medicare Other

## 2021-05-07 ENCOUNTER — Encounter (HOSPITAL_COMMUNITY): Payer: Self-pay | Admitting: Physician Assistant

## 2021-05-07 ENCOUNTER — Other Ambulatory Visit: Payer: Self-pay

## 2021-05-07 ENCOUNTER — Other Ambulatory Visit (HOSPITAL_COMMUNITY): Payer: Self-pay

## 2021-05-07 ENCOUNTER — Ambulatory Visit (HOSPITAL_COMMUNITY)
Admission: RE | Admit: 2021-05-07 | Discharge: 2021-05-07 | Disposition: A | Discharge status: Home / Self Care | Payer: Medicare Other | Source: Ambulatory Visit | Attending: Physician Assistant | Admitting: Physician Assistant

## 2021-05-07 VITALS — BP 142/90 | HR 85 | Ht 68.0 in | Wt 191.4 lb

## 2021-05-07 DIAGNOSIS — Z85038 Personal history of other malignant neoplasm of large intestine: Secondary | ICD-10-CM | POA: Insufficient documentation

## 2021-05-07 DIAGNOSIS — Z79899 Other long term (current) drug therapy: Secondary | ICD-10-CM | POA: Insufficient documentation

## 2021-05-07 DIAGNOSIS — I4510 Unspecified right bundle-branch block: Secondary | ICD-10-CM | POA: Diagnosis not present

## 2021-05-07 DIAGNOSIS — D6869 Other thrombophilia: Secondary | ICD-10-CM | POA: Insufficient documentation

## 2021-05-07 DIAGNOSIS — I2510 Atherosclerotic heart disease of native coronary artery without angina pectoris: Secondary | ICD-10-CM | POA: Diagnosis not present

## 2021-05-07 DIAGNOSIS — Z8673 Personal history of transient ischemic attack (TIA), and cerebral infarction without residual deficits: Secondary | ICD-10-CM | POA: Insufficient documentation

## 2021-05-07 DIAGNOSIS — Z9049 Acquired absence of other specified parts of digestive tract: Secondary | ICD-10-CM | POA: Insufficient documentation

## 2021-05-07 DIAGNOSIS — I444 Left anterior fascicular block: Secondary | ICD-10-CM | POA: Insufficient documentation

## 2021-05-07 DIAGNOSIS — Z9181 History of falling: Secondary | ICD-10-CM | POA: Insufficient documentation

## 2021-05-07 DIAGNOSIS — I130 Hypertensive heart and chronic kidney disease with heart failure and stage 1 through stage 4 chronic kidney disease, or unspecified chronic kidney disease: Secondary | ICD-10-CM | POA: Diagnosis not present

## 2021-05-07 DIAGNOSIS — I5022 Chronic systolic (congestive) heart failure: Secondary | ICD-10-CM | POA: Insufficient documentation

## 2021-05-07 DIAGNOSIS — I4819 Other persistent atrial fibrillation: Secondary | ICD-10-CM | POA: Insufficient documentation

## 2021-05-07 DIAGNOSIS — N189 Chronic kidney disease, unspecified: Secondary | ICD-10-CM | POA: Diagnosis not present

## 2021-05-07 DIAGNOSIS — Z7901 Long term (current) use of anticoagulants: Secondary | ICD-10-CM | POA: Diagnosis not present

## 2021-05-07 MED ORDER — AMIODARONE HCL 200 MG PO TABS: ORAL_TABLET | ORAL | 0 refills | Status: DC

## 2021-05-07 NOTE — Patient Instructions (Signed)
Start Amiodarone '200mg'$  twice a day for the next month then reduce to once a day. Take with food ?

## 2021-05-07 NOTE — Progress Notes (Signed)
Primary Care Physician: [name], MD Primary Cardiologist: Dr Radford Pax Primary Electrophysiologist: none Referring Physician: Dr Horton [name] is a 81 y.o. male with a history of colon cancer s/p sigmoid colectomy 2018, CKD, CVA, HTN, atrial fibrillation who presents for follow up in the [facility].  The patient was initially diagnosed with atrial fibrillation 07/2020 in the setting of acute CVA. Patient is on Eliquis for a CHADS2VASC score of 5. Patient was admitted 12/23/20 with a mechanical fall. patient was carrying a heavy load and walking down the back porch when he fell.  Wife states that he did not lose consciousness and was able to get up with help from his son.  Patient came back inside and rested on a chair.  30 minutes later wife came back and found him difficult to arouse and called EMS.  Patient states he does not remember the event but also states that he did not lose consciousness. Echo showed mildly reduced EF 45-50% with severe akineses of apical anteroseptal wall and apical segment. Patient underwent LHC on 03/11/21 which showed mild-mod nonobstructive CAD, medical therapy recommended.    On follow up today, patient is s/p DCCV on 04/23/21. Unfortunately, he is back in rate controlled afib today. He reports that he felt no different after DCCV and feels "great" today. No bleeding issues on anticoagulation.   Today, he denies symptoms of palpitations, chest pain, shortness of breath, orthopnea, PND, lower extremity edema, dizziness, presyncope, syncope, snoring, daytime somnolence, bleeding.The patient is tolerating medications without difficulties and is otherwise without complaint today.    Atrial Fibrillation Risk Factors:  he does not have symptoms or diagnosis of sleep apnea. he does not have a history of rheumatic fever. he does not have a history of alcohol use.   he has a BMI of Body mass index is 29.1 kg/m.Marland Kitchen Filed  Weights   05/07/21 1047  Weight: 86.8 kg      Family History  Problem Relation Age of Onset   Cancer Mother        LUNG   COPD Father      Atrial Fibrillation Management history:  Previous antiarrhythmic drugs: none Previous cardioversions: none Previous ablations: none CHADS2VASC score: 5 Anticoagulation history: Eliquis   Past Medical History:  Diagnosis Date   Anxiety    Arthritis    Bilateral leg edema    Cataract    Colon cancer ([place]) dx'd 11/2016   "stage 3"   Gout    Gout    Hemorrhoids    History of kidney stones    "passed it"   Hypertension    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[place])    EF 45-50% by echo 10/22   PAF (paroxysmal atrial fibrillation) ([place])    Pre-diabetes    Past Surgical History:  Procedure Laterality Date   CARDIOVERSION [name] 04/23/2021   Procedure: CARDIOVERSION;  Surgeon: [name], MD;  Location: [facility] ENDOSCOPY;  Service: Cardiovascular;  Laterality: [name];   COLECTOMY  12/17/2016   lap; partial sigmoid colectomy/notes 12/17/2016   COLONOSCOPY W/ BIOPSIES AND POLYPECTOMY  11/11/2016   LAPAROSCOPIC SIGMOID COLECTOMY [name] 12/17/2016   Procedure: LAPAROSCOPIC SIGMOID COLECTOMY;  Surgeon: [name], MD;  Location: [facility];  Service: General;  Laterality: [name];   LEFT HEART CATH AND CORONARY ANGIOGRAPHY [name] 03/11/2021   Procedure: LEFT HEART CATH AND CORONARY ANGIOGRAPHY;  Surgeon: [name], MD;  Location: [place] CV LAB;  Service: Cardiovascular;  Laterality: [name];  NASAL SEPTUM SURGERY     TONSILLECTOMY      Current Outpatient Medications  Medication Sig Dispense Refill   amiodarone (PACERONE) 200 MG tablet Take 1 tablet by mouth twice a day for 1 month then reduce to 1 tablet daily 60 tablet 0   apixaban (ELIQUIS) 2.5 MG TABS tablet Take 1 tablet (2.5 mg total) by mouth 2 (two) times daily. 180 tablet 3   colestipol (COLESTID) 1 g tablet Take 1 tablet (1 g total) by mouth 2 (two) times daily. 30 tablet 0    diclofenac Sodium (VOLTAREN) 1 % GEL Apply 4 g topically 4 (four) times daily. 4 g 0   ezetimibe (ZETIA) 10 MG tablet Take 1 tablet (10 mg total) by mouth daily. 90 tablet 3   furosemide (LASIX) 20 MG tablet Take 1 tablet (20 mg total) by mouth every other day. 45 tablet 3   liver oil-zinc oxide (DESITIN) 40 % ointment Apply 1 application topically as needed for irritation.     neomycin-bacitracin-polymyxin (NEOSPORIN) ointment Apply 1 application topically as needed for wound care.     Phenylephrine HCl (NEO-SYNEPHRINE NA) Place 1 spray into the nose daily as needed (congestion).     potassium chloride (KLOR-CON M) 10 MEQ tablet Take 10 mEq by mouth daily.     rosuvastatin (CRESTOR) 40 MG tablet Take 1 tablet (40 mg total) by mouth daily. 30 tablet 0   trolamine salicylate (ASPERCREME) 10 % cream Apply 1 application topically 2 (two) times daily as needed for muscle pain.     No current facility-administered medications for this encounter.    No Known Allergies  Social History   Socioeconomic History   Marital status: Married    Spouse name: Not on file   Number of children: Not on file   Years of education: Not on file   Highest education level: Not on file  Occupational History   Not on file  Tobacco Use   Smoking status: Never   Smokeless tobacco: Never  Vaping Use   Vaping Use: Never used  Substance and Sexual Activity   Alcohol use: No   Drug use: No   Sexual activity: Never  Other Topics Concern   Not on file  Social History Narrative   Not on file   Social Determinants of Health   Financial Resource Strain: Not on file  Food Insecurity: Not on file  Transportation Needs: Not on file  Physical Activity: Not on file  Stress: Not on file  Social Connections: Not on file  Intimate Partner Violence: Not on file     ROS- All systems are reviewed and negative except as per the HPI above.  Physical Exam: Vitals:   05/07/21 1047  BP: (!) 142/90  Pulse: 85   Weight: 86.8 kg  Height: '5\' 8"'$  (1.727 m)    GEN- The patient is a well appearing elderly male, alert and oriented x 3 today.   HEENT-head normocephalic, atraumatic, sclera clear, conjunctiva pink, hearing intact, trachea midline. Lungs- Clear to ausculation bilaterally, normal work of breathing Heart- irregular rate and rhythm, no murmurs, rubs or gallops  GI- soft, NT, ND, + BS Extremities- no clubbing, cyanosis, or edema MS- no significant deformity or atrophy Skin- no rash or lesion Psych- euthymic mood, full affect Neuro- strength and sensation are intact   Wt Readings from Last 3 Encounters:  05/07/21 86.8 kg  04/23/21 84.8 kg  04/18/21 84.8 kg    EKG today demonstrates  Afib, RBBB, LAFB  Vent. rate 85 BPM PR interval * ms QRS duration 128 ms QT/QTcB 376/447 ms  Echo 12/24/20 demonstrated   1. Left ventricular ejection fraction, by estimation, is 45 to 50%. The  left ventricle has mildly decreased function. The left ventricle  demonstrates regional wall motion abnormalities (see scoring  diagram/findings for description). The left ventricular   internal cavity size was mildly dilated. Left ventricular diastolic  function could not be evaluated. There is severe akinesis of the left  ventricular, apical anteroseptal wall and apical segment.   2. Right ventricular systolic function is normal. The right ventricular  size is normal.   3. Left atrial size was moderately dilated.   4. Right atrial size was mildly dilated.   5. The mitral valve is grossly normal. Mild mitral valve regurgitation.   6. The aortic valve is normal in structure. Aortic valve regurgitation is  not visualized. No aortic stenosis is present.   Epic records are reviewed at length today  CHA2DS2-VASc Score = 5  The patient's score is based upon: CHF History: 0 HTN History: 1 Diabetes History: 0 Stroke History: 2 Vascular Disease History: 0 Age Score: 2 Gender Score: 0       ASSESSMENT  AND PLAN: 1. Persistent Atrial Fibrillation (ICD10:  I48.19) The patient's CHA2DS2-VASc score is 5, indicating a 7.2% annual risk of stroke.   S/p DCCV on 04/23/21 with early return of afib. We discussed treatment options today including rate vs rhythm control. Given his decreased EF, rate control may not be favorable despite his paucity of symptoms. After discussing the risks and benefits, will start amiodarone 200 mg BID and plan for repeat DCCV after loading. Patient and wife in agreement with plan. Recent lab work and CXR reviewed.  Continue Eliquis 2.5 mg BID  2. Secondary Hypercoagulable State (ICD10:  D68.69) The patient is at significant risk for stroke/thromboembolism based upon his CHA2DS2-VASc Score of 5.  Continue Apixaban (Eliquis).   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 Cannot use ACE inhibitor/ARB/Entresto due to CKD  4. HTN Stable, no changes today.   Follow up in the AF clinic in 2 weeks.    [facility] [address] [place], [place] [address] [phone] 05/07/2021 11:19 AM

## 2021-05-21 ENCOUNTER — Ambulatory Visit (HOSPITAL_COMMUNITY)
Admission: RE | Admit: 2021-05-21 | Discharge: 2021-05-21 | Disposition: A | Discharge status: Home / Self Care | Payer: Medicare Other | Source: Ambulatory Visit | Attending: Physician Assistant | Admitting: Physician Assistant

## 2021-05-21 ENCOUNTER — Encounter (HOSPITAL_COMMUNITY): Payer: Self-pay | Admitting: Physician Assistant

## 2021-05-21 ENCOUNTER — Other Ambulatory Visit: Payer: Self-pay

## 2021-05-21 VITALS — BP 138/76 | HR 53 | Ht 68.0 in | Wt 194.2 lb

## 2021-05-21 DIAGNOSIS — Z85038 Personal history of other malignant neoplasm of large intestine: Secondary | ICD-10-CM | POA: Insufficient documentation

## 2021-05-21 DIAGNOSIS — I4819 Other persistent atrial fibrillation: Secondary | ICD-10-CM | POA: Diagnosis present

## 2021-05-21 DIAGNOSIS — I5022 Chronic systolic (congestive) heart failure: Secondary | ICD-10-CM | POA: Diagnosis not present

## 2021-05-21 DIAGNOSIS — N189 Chronic kidney disease, unspecified: Secondary | ICD-10-CM | POA: Diagnosis not present

## 2021-05-21 DIAGNOSIS — D6869 Other thrombophilia: Secondary | ICD-10-CM

## 2021-05-21 DIAGNOSIS — I1310 Hypertensive heart and chronic kidney disease without heart failure, with stage 1 through stage 4 chronic kidney disease, or unspecified chronic kidney disease: Secondary | ICD-10-CM | POA: Insufficient documentation

## 2021-05-21 DIAGNOSIS — Z7901 Long term (current) use of anticoagulants: Secondary | ICD-10-CM | POA: Insufficient documentation

## 2021-05-21 MED ORDER — AMIODARONE HCL 200 MG PO TABS: 200.0000 mg | ORAL_TABLET | Freq: Every day | ORAL | 1 refills | Status: DC

## 2021-05-21 NOTE — Progress Notes (Signed)
? ? ?Primary Care Physician: [name], MD ?Primary Cardiologist: Dr Radford Pax ?Primary Electrophysiologist: none ?Referring Physician: Dr Radford Pax ? ? ?Edwin Wall is a 81 y.o. male with a history of colon cancer s/p sigmoid colectomy 2018, CKD, CVA, HTN, atrial fibrillation who presents for follow up in the [facility].  The patient was initially diagnosed with atrial fibrillation 07/2020 in the setting of acute CVA. Patient is on Eliquis for a CHADS2VASC score of 5. Patient was admitted 12/23/20 with a mechanical fall. patient was carrying a heavy load and walking down the back porch when he fell.  Wife states that he did not lose consciousness and was able to get up with help from his son.  Patient came back inside and rested on a chair.  30 minutes later wife came back and found him difficult to arouse and called EMS.  Patient states he does not remember the event but also states that he did not lose consciousness. Echo showed mildly reduced EF 45-50% with severe akineses of apical anteroseptal wall and apical segment. Patient underwent LHC on 03/11/21 which showed mild-mod nonobstructive CAD, medical therapy recommended.  Patient is s/p DCCV on 04/23/21 with early return of afib. Loaded on amiodarone.  ? ?On follow up today, patient reports he has done well since his last visit. He is in SR today. He is tolerating the medication without difficulty. No bleeding issues on anticoagulation.  ? ?Today, he denies symptoms of palpitations, chest pain, shortness of breath, orthopnea, PND, lower extremity edema, dizziness, presyncope, syncope, snoring, daytime somnolence, bleeding.The patient is tolerating medications without difficulties and is otherwise without complaint today.  ? ? ?Atrial Fibrillation Risk Factors: ? ?he does not have symptoms or diagnosis of sleep apnea. ?he does not have a history of rheumatic fever. ?he does not have a history of alcohol use. ? ? ?he has a BMI  of Body mass index is 29.53 kg/m?Marland KitchenMarland Kitchen ?Filed Weights  ? 05/21/21 1051  ?Weight: 88.1 kg  ? ? ? ? ?Family History  ?Problem Relation Age of Onset  ? Cancer Mother   ?     LUNG  ? COPD Father   ? ? ? ?Atrial Fibrillation Management history: ? ?Previous antiarrhythmic drugs: amiodarone  ?Previous cardioversions: 04/23/21 ?Previous ablations: none ?CHADS2VASC score: 5 ?Anticoagulation history: Eliquis ? ? ?Past Medical History:  ?Diagnosis Date  ? Anxiety   ? Arthritis   ? Bilateral leg edema   ? Cataract   ? Colon cancer ([place]) dx'd 11/2016  ? "stage 3"  ? Gout   ? Gout   ? Hemorrhoids   ? History of kidney stones   ? "passed it"  ? Hypertension   ? 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[place])   ? EF 45-50% by echo 10/22  ? PAF (paroxysmal atrial fibrillation) ([place])   ? Pre-diabetes   ? ?Past Surgical History:  ?Procedure Laterality Date  ? CARDIOVERSION [name] 04/23/2021  ? Procedure: CARDIOVERSION;  Surgeon: [name], MD;  Location: [facility] ENDOSCOPY;  Service: Cardiovascular;  Laterality: [name];  ? COLECTOMY  12/17/2016  ? lap; partial sigmoid colectomy/notes 12/17/2016  ? COLONOSCOPY W/ BIOPSIES AND POLYPECTOMY  11/11/2016  ? LAPAROSCOPIC SIGMOID COLECTOMY [name] 12/17/2016  ? Procedure: LAPAROSCOPIC SIGMOID COLECTOMY;  Surgeon: [name], MD;  Location: [facility];  Service: General;  Laterality: [name];  ? LEFT HEART CATH AND CORONARY ANGIOGRAPHY [name] 03/11/2021  ? Procedure: LEFT HEART CATH AND CORONARY ANGIOGRAPHY;  Surgeon: [name], MD;  Location: [facility]  INVASIVE CV LAB;  Service: Cardiovascular;  Laterality: [name];  ? NASAL SEPTUM SURGERY    ? TONSILLECTOMY    ? ? ?Current Outpatient Medications  ?Medication Sig Dispense Refill  ? amiodarone (PACERONE) 200 MG tablet Take 1 tablet by mouth twice a day for 1 month then reduce to 1 tablet daily 180 tablet 0  ? apixaban (ELIQUIS) 2.5 MG TABS tablet Take 1 tablet (2.5 mg total) by mouth 2 (two) times daily. 180 tablet 3  ? colestipol (COLESTID) 1 g tablet Take 1 tablet (1 g  total) by mouth 2 (two) times daily. 30 tablet 0  ? diclofenac Sodium (VOLTAREN) 1 % GEL Apply 4 g topically 4 (four) times daily. 4 g 0  ? ezetimibe (ZETIA) 10 MG tablet Take 1 tablet (10 mg total) by mouth daily. 90 tablet 3  ? furosemide (LASIX) 20 MG tablet Take 1 tablet (20 mg total) by mouth every other day. 45 tablet 3  ? liver oil-zinc oxide (DESITIN) 40 % ointment Apply 1 application topically as needed for irritation.    ? neomycin-bacitracin-polymyxin (NEOSPORIN) ointment Apply 1 application topically as needed for wound care.    ? Phenylephrine HCl (NEO-SYNEPHRINE NA) Place 1 spray into the nose daily as needed (congestion).    ? potassium chloride (KLOR-CON M) 10 MEQ tablet Take 10 mEq by mouth daily.    ? rosuvastatin (CRESTOR) 40 MG tablet Take 1 tablet (40 mg total) by mouth daily. 30 tablet 0  ? trolamine salicylate (ASPERCREME) 10 % cream Apply 1 application topically 2 (two) times daily as needed for muscle pain.    ? ?No current facility-administered medications for this encounter.  ? ? ?No Known Allergies ? ?Social History  ? ?Socioeconomic History  ? Marital status: Married  ?  Spouse name: Not on file  ? Number of children: Not on file  ? Years of education: Not on file  ? Highest education level: Not on file  ?Occupational History  ? Not on file  ?Tobacco Use  ? Smoking status: Never  ? Smokeless tobacco: Never  ? Tobacco comments:  ?  Never smoke 05/21/21  ?Vaping Use  ? Vaping Use: Never used  ?Substance and Sexual Activity  ? Alcohol use: No  ? Drug use: No  ? Sexual activity: Never  ?Other Topics Concern  ? Not on file  ?Social History Narrative  ? Not on file  ? ?Social Determinants of Health  ? ?Financial Resource Strain: Not on file  ?Food Insecurity: Not on file  ?Transportation Needs: Not on file  ?Physical Activity: Not on file  ?Stress: Not on file  ?Social Connections: Not on file  ?Intimate Partner Violence: Not on file  ? ? ? ?ROS- All systems are reviewed and negative except as  per the HPI above. ? ?Physical Exam: ?Vitals:  ? 05/21/21 1051  ?BP: 138/76  ?Pulse: (!) 53  ?Weight: 88.1 kg  ?Height: '5\' 8"'$  (1.727 m)  ? ? ?GEN- The patient is a well appearing elderly male, alert and oriented x 3 today.   ?HEENT-head normocephalic, atraumatic, sclera clear, conjunctiva pink, hearing intact, trachea midline. ?Lungs- Clear to ausculation bilaterally, normal work of breathing ?Heart- Regular rate and rhythm, no murmurs, rubs or gallops  ?GI- soft, NT, ND, + BS ?Extremities- no clubbing, cyanosis, or edema ?MS- no significant deformity or atrophy ?Skin- no rash or lesion ?Psych- euthymic mood, full affect ?Neuro- strength and sensation are intact ? ? ?Wt Readings from Last 3 Encounters:  ?  05/21/21 88.1 kg  ?05/07/21 86.8 kg  ?04/23/21 84.8 kg  ? ? ?EKG today demonstrates  ?SB, RBBB, LAFB ?Vent. rate 53 BPM ?PR interval 150 ms ?QRS duration 136 ms ?QT/QTcB 468/439 ms ? ?Echo 12/24/20 demonstrated  ? 1. Left ventricular ejection fraction, by estimation, is 45 to 50%. The  ?left ventricle has mildly decreased function. The left ventricle  ?demonstrates regional wall motion abnormalities (see scoring  ?diagram/findings for description). The left ventricular  ? internal cavity size was mildly dilated. Left ventricular diastolic  ?function could not be evaluated. There is severe akinesis of the left  ?ventricular, apical anteroseptal wall and apical segment.  ? 2. Right ventricular systolic function is normal. The right ventricular  ?size is normal.  ? 3. Left atrial size was moderately dilated.  ? 4. Right atrial size was mildly dilated.  ? 5. The mitral valve is grossly normal. Mild mitral valve regurgitation.  ? 6. The aortic valve is normal in structure. Aortic valve regurgitation is  ?not visualized. No aortic stenosis is present.  ? ?Epic records are reviewed at length today ? ?CHA2DS2-VASc Score = 5  ?The patient's score is based upon: ?CHF History: 0 ?HTN History: 1 ?Diabetes History: 0 ?Stroke  History: 2 ?Vascular Disease History: 0 ?Age Score: 2 ?Gender Score: 0 ?    ? ? ?ASSESSMENT AND PLAN: ?1. Persistent Atrial Fibrillation (ICD10:  I48.19) ?The patient's CHA2DS2-VASc score is 5, indicatin

## 2021-05-21 NOTE — Patient Instructions (Signed)
Decrease amiodarone to 200mg once a day 

## 2021-06-07 ENCOUNTER — Ambulatory Visit: Payer: Medicare Other | Admitting: Physical Medicine and Rehabilitation

## 2021-06-10 ENCOUNTER — Encounter: Payer: Medicare Other | Admitting: Physical Medicine and Rehabilitation

## 2021-06-12 ENCOUNTER — Other Ambulatory Visit: Payer: Medicare Other | Admitting: *Deleted

## 2021-06-12 DIAGNOSIS — E7800 Pure hypercholesterolemia, unspecified: Secondary | ICD-10-CM

## 2021-06-12 DIAGNOSIS — Z79899 Other long term (current) drug therapy: Secondary | ICD-10-CM

## 2021-06-12 LAB — LIPID PANEL
Chol/HDL Ratio: 2.2 ratio (ref 0.0–5.0)
Cholesterol, Total: 101 mg/dL (ref 100–199)
HDL: 46 mg/dL (ref >39)
LDL Chol Calc (NIH): 40 mg/dL (ref 0–99)
Triglycerides: 67 mg/dL (ref 0–149)
VLDL Cholesterol Cal: 15 mg/dL (ref 5–40)

## 2021-06-12 LAB — ALT: ALT: 26 IU/L (ref 0–44)

## 2021-06-21 ENCOUNTER — Encounter: Payer: Medicare Other | Admitting: Physical Medicine and Rehabilitation

## 2021-06-25 ENCOUNTER — Other Ambulatory Visit (HOSPITAL_COMMUNITY): Payer: Self-pay | Admitting: Physician Assistant

## 2021-07-03 ENCOUNTER — Encounter
Payer: Medicare Other | Attending: Physical Medicine and Rehabilitation | Admitting: Physical Medicine and Rehabilitation

## 2021-07-03 ENCOUNTER — Encounter: Payer: Self-pay | Admitting: Physical Medicine and Rehabilitation

## 2021-07-03 VITALS — BP 155/99 | HR 39 | Ht 68.0 in | Wt 212.0 lb

## 2021-07-03 DIAGNOSIS — R600 Localized edema: Secondary | ICD-10-CM | POA: Diagnosis present

## 2021-07-03 DIAGNOSIS — R269 Unspecified abnormalities of gait and mobility: Secondary | ICD-10-CM | POA: Diagnosis present

## 2021-07-03 DIAGNOSIS — I639 Cerebral infarction, unspecified: Secondary | ICD-10-CM | POA: Diagnosis present

## 2021-07-03 DIAGNOSIS — R4701 Aphasia: Secondary | ICD-10-CM | POA: Diagnosis present

## 2021-07-03 NOTE — Progress Notes (Signed)
? ?Subjective:  ? ? Patient ID: Edwin Wall, male    DOB: Dec 04, 1940, 81 y.o.   MRN: [id number] ? ?HPI ? 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  ?Here for f/u on stroke.  ? ?Has fallen 2x- when bent over to pick something up x2 one inside and one outside house- and fell both times.  ?Last fell 06/15/21- fell outside. And then also fell yesterday when got apple off floor.  ?  ? ?Loves to work in yard- picking up small tree limbs- then got dizzy and fell down..  ? ?Still walking with cane.  ? ? ?Got Amiodarone refilled 4/25- but doesn't think they changed dose.  ? ? ?Had "shock" treatment for Afib couple months ago- and then put on Amiodarone for Afib-  ?And was better after Amio- but keeps going in/out . ? ?Doesn't feel weak anymore form stroke- doesn't feel like has R hemi anymore  ? ?Never saw Urology- still peeing every couple of hours- fluid pill makes it worse.  ?On every other day QOD.  ? ?Pain Inventory ?Average Pain 0 ?Pain Right Now 0 ?My pain is  na ? ?LOCATION OF PAIN  na ? ?BOWEL ?Number of stools per week: 7 ?Oral laxative use No  ?Type of laxative na ?Enema or suppository use No  ?History of colostomy No  ?Incontinent No  ? ?BLADDER ?Normal ?In and out cath, frequency na ?Able to self cath  na ?Bladder incontinence No  ?Frequent urination No  ?Leakage with coughing No  ?Difficulty starting stream No  ?Incomplete bladder emptying No  ? ? ?Mobility ?use a cane ?ability to climb steps?  yes ?do you drive?  no ? ?Function ?retired ? ?Neuro/Psych ?bladder control problems ?dizziness ? ?Prior Studies ?Any changes since last visit?  no ? ?Physicians involved in your care ?Any changes since last visit?  no ? ? ?Family History  ?Problem Relation Age of Onset  ? Cancer Mother   ?     LUNG   ? COPD Father   ? ?Social History  ? ?Socioeconomic History  ? Marital status: Married  ?  Spouse name: Not on file  ? Number of children: Not on file  ? Years of education: Not on file  ? Highest education level: Not on file  ?Occupational History  ? Not on file  ?Tobacco Use  ? Smoking status: Never  ? Smokeless tobacco: Never  ? Tobacco comments:  ?  Never smoke 05/21/21  ?Vaping Use  ? Vaping Use: Never used  ?Substance and Sexual Activity  ? Alcohol use: No  ? Drug use: No  ? Sexual activity: Never  ?Other Topics Concern  ? Not on file  ?Social History Narrative  ? Not on file  ? ?Social Determinants of Health  ? ?Financial Resource Strain: Not on file  ?Food Insecurity: Not on file  ?Transportation Needs: Not on file  ?Physical Activity: Not on file  ?Stress: Not on file  ?Social Connections: Not on file  ? ?Past Surgical History:  ?Procedure Laterality Date  ? CARDIOVERSION [name] 04/23/2021  ? Procedure: CARDIOVERSION;  Surgeon: [name], MD;  Location: [place];  Service: Cardiovascular;  Laterality: [name];  ? COLECTOMY  12/17/2016  ? lap; partial sigmoid colectomy/notes 12/17/2016  ? COLONOSCOPY W/ BIOPSIES AND POLYPECTOMY  11/11/2016  ? LAPAROSCOPIC SIGMOID COLECTOMY [name] 12/17/2016  ? Procedure: LAPAROSCOPIC SIGMOID COLECTOMY;  Surgeon: [name], MD;  Location: [place];  Service: General;  Laterality: [name];  ? LEFT HEART CATH AND CORONARY ANGIOGRAPHY [name] 03/11/2021  ? Procedure: LEFT HEART CATH AND CORONARY ANGIOGRAPHY;  Surgeon: [name], MD;  Location: [place] CV LAB;  Service: Cardiovascular;  Laterality: [name];  ? NASAL SEPTUM SURGERY    ? TONSILLECTOMY    ? ?Past Medical History:  ?Diagnosis Date  ? Anxiety   ? Arthritis   ? Bilateral leg edema   ? Cataract   ? Colon cancer ([place]) dx'd 11/2016  ? "stage 3"  ? Gout   ? Gout   ? Hemorrhoids   ? History of kidney stones   ? "passed it"  ? Hypertension   ? 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[place])   ? EF 45-50% by echo 10/22  ? PAF  (paroxysmal atrial fibrillation) ([place])   ? Pre-diabetes   ? ?BP (!) 155/99   Pulse (!) 39   Ht '5\' 8"'$  (1.727 m)   Wt 212 lb (96.2 kg)   SpO2 97%   BMI 32.23 kg/m?  ? ?Opioid Risk Score:   ?Fall Risk Score:  `1 ? ?Depression screen PHQ 2/9 ? ? ?  09/10/2020  ? 11:13 AM  ?Depression screen PHQ 2/9  ?Decreased Interest 0  ?Down, Depressed, Hopeless 0  ?PHQ - 2 Score 0  ?Altered sleeping 0  ?Tired, decreased energy 0  ?Change in appetite 0  ?Feeling bad or failure about yourself  1  ?Trouble concentrating 0  ?Moving slowly or fidgety/restless 1  ?Suicidal thoughts 0  ?PHQ-9 Score 2  ?Difficult doing work/chores Not difficult at all  ?  ? ?Review of Systems  ?Neurological:  Positive for dizziness.  ?All other systems reviewed and are negative. ? ?   ?Objective:  ? Physical Exam ? ?Heart rate on manual testing is 56- on electronic/automatic cuff was 39- due to Afib,  ?On exam, awake, alert, appropriate, accompanied by wife, NAD ?Has lost weight since saw last ?Is in Afib- heart rate ~ 56/bradycardic - but hard to count due to Afib.  ?CTA B/L - soft, NT, ND, (+)BS ? ?3+ LE edema to mid calf B/L  ?Pitting edema ? ?MS: RUE 5/5 as well as LUE ?RLE- HF 4+/5; otherwise 5-/5;  ?LLE 5/5 ? ?  Neuro: no spasticity ?Decreased sensation chronic- R 2nd digit and 5th digit-  ?Still having difficulty with word finding- having a lot of word substitutions.  ?Assessment & Plan:  ? 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  ?Here for f/u on stroke. ? ?BP 155/99 today, however heart rate is low/bradycardic  ? ? ?2. Has recovered all of strength on RUE, and 95% of strength in RLE-  ? ?3. Still has some aphasia- still work on [employer]- quiz him on nouns-  call him on the phone.  ? ? ?4. Can try driving in parking lot and then  progress- slowly.  ? ?5. Off Amantadine- speech didn't get worse off it- so since doing better; will keep off it.  ? ? ?  6. Call Cardiology and tell then you are in Afib again; also tell them your heart rate is low- so in 50's- and tell then you've had 2 falls in last 2 weeks- see if possible for Cardiology to changing Amiodarone due to dizziness/falls - and low heart rate. Call [facility]-  ? ?7. F/U as needed ? ?I spent a total of 26   minutes on total care today- >50% coordination of care- due to discussing driving, low heart rate and need to possibly change Amiodarone-  ?Also discussed aphasia and treatments.  ? ? ?

## 2021-07-03 NOTE — Patient Instructions (Signed)
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  ?Here for f/u on stroke. ? ?BP 155/99 today, however heart rate is low/bradycardic  ? ? ?2. Has recovered all of strength on RUE, and 95% of strength in RLE-  ? ?3. Still has some aphasia- still work on [employer]- quiz him on nouns-  call him on the phone.  ? ? ?4. Can try driving in parking lot and then progress- slowly.  ? ?5. Off Amantadine- speech didn't get worse off it- so since doing better; will keep off it.  ? ? ?6. Call Cardiology and tell then you are in Afib again; also tell them your heart rate is low- so in 50's- and tell then you've had 2 falls in last 2 weeks- see if possible for Cardiology to changing Amiodarone due to dizziness/falls - and low heart rate. Call [place]-  ? ?7. F/U as needed ?

## 2021-07-04 ENCOUNTER — Telehealth (HOSPITAL_COMMUNITY): Payer: Self-pay | Admitting: *Deleted

## 2021-07-04 MED ORDER — AMIODARONE HCL 200 MG PO TABS: 100.0000 mg | ORAL_TABLET | Freq: Every day | ORAL | 1 refills | Status: DC

## 2021-07-04 NOTE — Telephone Encounter (Signed)
Pt wife called in stating pt saw neurologist yesterday and was concerned regarding low heart rates and several falls over the last month.  Discussed with Adline Peals PA will decrease Amiodarone to '100mg'$  daily and follow up tomorrow. Pt wife in agreement. ?

## 2021-07-05 ENCOUNTER — Ambulatory Visit (HOSPITAL_COMMUNITY)
Admission: RE | Admit: 2021-07-05 | Discharge: 2021-07-05 | Disposition: A | Discharge status: Home / Self Care | Payer: Medicare Other | Source: Ambulatory Visit | Attending: Physician Assistant | Admitting: Physician Assistant

## 2021-07-05 ENCOUNTER — Encounter (HOSPITAL_COMMUNITY): Payer: Self-pay | Admitting: Physician Assistant

## 2021-07-05 VITALS — BP 142/100 | HR 66 | Ht 68.0 in | Wt 213.0 lb

## 2021-07-05 DIAGNOSIS — I4819 Other persistent atrial fibrillation: Secondary | ICD-10-CM | POA: Diagnosis not present

## 2021-07-05 DIAGNOSIS — Z85038 Personal history of other malignant neoplasm of large intestine: Secondary | ICD-10-CM | POA: Insufficient documentation

## 2021-07-05 DIAGNOSIS — I340 Nonrheumatic mitral (valve) insufficiency: Secondary | ICD-10-CM | POA: Insufficient documentation

## 2021-07-05 DIAGNOSIS — D6859 Other primary thrombophilia: Secondary | ICD-10-CM | POA: Diagnosis not present

## 2021-07-05 DIAGNOSIS — Z7901 Long term (current) use of anticoagulants: Secondary | ICD-10-CM | POA: Diagnosis not present

## 2021-07-05 DIAGNOSIS — D6869 Other thrombophilia: Secondary | ICD-10-CM

## 2021-07-05 DIAGNOSIS — Z8673 Personal history of transient ischemic attack (TIA), and cerebral infarction without residual deficits: Secondary | ICD-10-CM | POA: Diagnosis not present

## 2021-07-05 DIAGNOSIS — I252 Old myocardial infarction: Secondary | ICD-10-CM | POA: Insufficient documentation

## 2021-07-05 DIAGNOSIS — N189 Chronic kidney disease, unspecified: Secondary | ICD-10-CM | POA: Insufficient documentation

## 2021-07-05 DIAGNOSIS — I130 Hypertensive heart and chronic kidney disease with heart failure and stage 1 through stage 4 chronic kidney disease, or unspecified chronic kidney disease: Secondary | ICD-10-CM | POA: Diagnosis not present

## 2021-07-05 DIAGNOSIS — I5020 Unspecified systolic (congestive) heart failure: Secondary | ICD-10-CM | POA: Insufficient documentation

## 2021-07-05 NOTE — Progress Notes (Signed)
? ? ?Primary Care Physician: [name], MD ?Primary Cardiologist: Dr Radford Pax ?Primary Electrophysiologist: none ?Referring Physician: Dr Radford Pax ? ? ?Edwin Wall is a 81 y.o. male with a history of colon cancer s/p sigmoid colectomy 2018, CKD, CVA, HTN, atrial fibrillation who presents for follow up in the [facility].  The patient was initially diagnosed with atrial fibrillation 07/2020 in the setting of acute CVA. Patient is on Eliquis for a CHADS2VASC score of 5. Patient was admitted 12/23/20 with a mechanical fall. patient was carrying a heavy load and walking down the back porch when he fell.  Wife states that he did not lose consciousness and was able to get up with help from his son.  Patient came back inside and rested on a chair.  30 minutes later wife came back and found him difficult to arouse and called EMS.  Patient states he does not remember the event but also states that he did not lose consciousness. Echo showed mildly reduced EF 45-50% with severe akineses of apical anteroseptal wall and apical segment. Patient underwent LHC on 03/11/21 which showed mild-mod nonobstructive CAD, medical therapy recommended.  Patient is s/p DCCV on 04/23/21 with early return of afib. Loaded on amiodarone.  ? ?On follow up today, patient reports that he has had more dizziness over the past couple weeks with two falls. Both falls occurred after he bent over to pick something up off the ground. No head trauma or LOC. There was concern that amiodarone could be contributing to his dizziness. He is back in afib today but unaware. No bleeding issues on anticoagulation.  ? ?Today, he denies symptoms of palpitations, chest pain, shortness of breath, orthopnea, PND, lower extremity edema, presyncope, syncope, snoring, daytime somnolence, bleeding.The patient is tolerating medications without difficulties and is otherwise without complaint today.  ? ? ?Atrial Fibrillation Risk  Factors: ? ?he does not have symptoms or diagnosis of sleep apnea. ?he does not have a history of rheumatic fever. ?he does not have a history of alcohol use. ? ? ?he has a BMI of Body mass index is 32.39 kg/m?Marland KitchenMarland Kitchen ?Filed Weights  ? 07/05/21 1022  ?Weight: 96.6 kg  ? ? ? ?Family History  ?Problem Relation Age of Onset  ? Cancer Mother   ?     LUNG  ? COPD Father   ? ? ? ?Atrial Fibrillation Management history: ? ?Previous antiarrhythmic drugs: amiodarone  ?Previous cardioversions: 04/23/21 ?Previous ablations: none ?CHADS2VASC score: 5 ?Anticoagulation history: Eliquis ? ? ?Past Medical History:  ?Diagnosis Date  ? Anxiety   ? Arthritis   ? Bilateral leg edema   ? Cataract   ? Colon cancer ([place]) dx'd 11/2016  ? "stage 3"  ? Gout   ? Gout   ? Hemorrhoids   ? History of kidney stones   ? "passed it"  ? Hypertension   ? 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[place])   ? EF 45-50% by echo 10/22  ? PAF (paroxysmal atrial fibrillation) ([place])   ? Pre-diabetes   ? ?Past Surgical History:  ?Procedure Laterality Date  ? CARDIOVERSION [name] 04/23/2021  ? Procedure: CARDIOVERSION;  Surgeon: [name], MD;  Location: [facility] END[facility]COPY;  Service: Cardiovascular;  Laterality: [name];  ? COLECTOMY  12/17/2016  ? lap; partial sigmoid colectomy/notes 12/17/2016  ? COLONOSCOPY W/ BIOPSIES AND POLYPECTOMY  11/11/2016  ? LAPAROSCOPIC SIGMOID COLECTOMY [name] 12/17/2016  ? Procedure: LAPAROSCOPIC SIGMOID COLECTOMY;  Surgeon: [name], MD;  Location: [facility];  Service: General;  Laterality: [name];  ? LEFT HEART CATH AND CORONARY ANGIOGRAPHY [name] 03/11/2021  ? Procedure: LEFT HEART CATH AND CORONARY ANGIOGRAPHY;  Surgeon: [name], MD;  Location: [facility];  Service: Cardiovascular;  Laterality: [name];  ? NASAL SEPTUM SURGERY    ? TONSILLECTOMY    ? ? ?Current Outpatient Medications  ?Medication Sig Dispense Refill  ? apixaban (ELIQUIS) 2.5 MG TABS tablet Take 1 tablet (2.5 mg total) by mouth 2 (two) times daily. 180 tablet 3  ?  colestipol (COLESTID) 1 g tablet Take 1 tablet (1 g total) by mouth 2 (two) times daily. 30 tablet 0  ? diclofenac Sodium (VOLTAREN) 1 % GEL Apply 4 g topically 4 (four) times daily. 4 g 0  ? ezetimibe (ZETIA) 10 MG tablet Take 1 tablet (10 mg total) by mouth daily. 90 tablet 3  ? furosemide (LASIX) 20 MG tablet Take 1 tablet (20 mg total) by mouth every other day. 45 tablet 3  ? liver oil-zinc oxide (DESITIN) 40 % ointment Apply 1 application topically as needed for irritation.    ? neomycin-bacitracin-polymyxin (NEOSPORIN) ointment Apply 1 application. topically as needed for wound care.    ? Phenylephrine HCl (NEO-SYNEPHRINE NA) Place 1 spray into the nose daily as needed (congestion).    ? potassium chloride (KLOR-CON M) 10 MEQ tablet Take 10 mEq by mouth daily.    ? rosuvastatin (CRESTOR) 40 MG tablet Take 1 tablet (40 mg total) by mouth daily. 30 tablet 0  ? ?No current facility-administered medications for this encounter.  ? ? ?No Known Allergies ? ?Social History  ? ?Socioeconomic History  ? Marital status: Married  ?  Spouse name: Not on file  ? Number of children: Not on file  ? Years of education: Not on file  ? Highest education level: Not on file  ?Occupational History  ? Not on file  ?Tobacco Use  ? Smoking status: Never  ? Smokeless tobacco: Never  ? Tobacco comments:  ?  Never smoke 05/21/21  ?Vaping Use  ? Vaping Use: Never used  ?Substance and Sexual Activity  ? Alcohol use: No  ? Drug use: No  ? Sexual activity: Never  ?Other Topics Concern  ? Not on file  ?Social History Narrative  ? Not on file  ? ?Social Determinants of Health  ? ?Financial Resource Strain: Not on file  ?Food Insecurity: Not on file  ?Transportation Needs: Not on file  ?Physical Activity: Not on file  ?Stress: Not on file  ?Social Connections: Not on file  ?Intimate Partner Violence: Not on file  ? ? ? ?ROS- All systems are reviewed and negative except as per the HPI above. ? ?Physical Exam: ?Vitals:  ? 07/05/21 1022  ?BP: (!)  142/100  ?Pulse: 66  ?Weight: 96.6 kg  ?Height: '5\' 8"'$  (1.727 m)  ? ? ? ?GEN- The patient is a well appearing elderly male, alert and oriented x 3 today.   ?HEENT-head normocephalic, atraumatic, sclera clear, conjunctiva pink, hearing intact, trachea midline. ?Lungs- Clear to ausculation bilaterally, normal work of breathing ?Heart- irregular rate and rhythm, no murmurs, rubs or gallops  ?GI- soft, NT, ND, + BS ?Extremities- no clubbing, cyanosis, or edema ?MS- no significant deformity or atrophy ?Skin- no rash or lesion ?Psych- euthymic mood, full affect ?Neuro- strength and sensation are intact ? ? ?Wt Readings from Last 3 Encounters:  ?07/05/21 96.6 kg  ?07/03/21 96.2 kg  ?05/21/21 88.1 kg  ? ? ?EKG today demonstrates  ?Afib, RBBB,  LAFB ?Vent. rate 66 BPM ?PR interval * ms ?QRS duration 134 ms ?QT/QTcB 418/438 ms ? ?Echo 12/24/20 demonstrated  ? 1. Left ventricular ejection fraction, by estimation, is 45 to 50%. The  ?left ventricle has mildly decreased function. The left ventricle  ?demonstrates regional wall motion abnormalities (see scoring  ?diagram/findings for description). The left ventricular  ? internal cavity size was mildly dilated. Left ventricular diastolic  ?function could not be evaluated. There is severe akinesis of the left  ?ventricular, apical anteroseptal wall and apical segment.  ? 2. Right ventricular systolic function is normal. The right ventricular  ?size is normal.  ? 3. Left atrial size was moderately dilated.  ? 4. Right atrial size was mildly dilated.  ? 5. The mitral valve is grossly normal. Mild mitral valve regurgitation.  ? 6. The aortic valve is normal in structure. Aortic valve regurgitation is  ?not visualized. No aortic stenosis is present.  ? ?Epic records are reviewed at length today ? ?CHA2DS2-VASc Score = 5  ?The patient's score is based upon: ?CHF History: 0 ?HTN History: 1 ?Diabetes History: 0 ?Stroke History: 2 ?Vascular Disease History: 0 ?Age Score: 2 ?Gender Score:  0 ?    ? ? ?ASSESSMENT AND PLAN: ?1. Persistent Atrial Fibrillation (ICD10:  I48.19) ?The patient's CHA2DS2-VASc score is 5, indicating a 7.2% annual risk of stroke.   ?Patient in afib today, unclear duration.  ?We discuss

## 2021-07-05 NOTE — Patient Instructions (Signed)
Stop Amiodarone 

## 2021-07-27 ENCOUNTER — Emergency Department (HOSPITAL_COMMUNITY): Payer: Medicare Other

## 2021-07-27 ENCOUNTER — Emergency Department (HOSPITAL_COMMUNITY)
Admission: EM | Admit: 2021-07-27 | Discharge: 2021-07-27 | Disposition: A | Discharge status: Home / Self Care | Payer: Medicare Other | Attending: Emergency Medicine | Admitting: Emergency Medicine

## 2021-07-27 DIAGNOSIS — Y900 Blood alcohol level of less than 20 mg/100 ml: Secondary | ICD-10-CM | POA: Diagnosis not present

## 2021-07-27 DIAGNOSIS — R404 Transient alteration of awareness: Secondary | ICD-10-CM | POA: Diagnosis not present

## 2021-07-27 DIAGNOSIS — I10 Essential (primary) hypertension: Secondary | ICD-10-CM | POA: Diagnosis not present

## 2021-07-27 DIAGNOSIS — E119 Type 2 diabetes mellitus without complications: Secondary | ICD-10-CM | POA: Insufficient documentation

## 2021-07-27 DIAGNOSIS — R600 Localized edema: Secondary | ICD-10-CM | POA: Insufficient documentation

## 2021-07-27 DIAGNOSIS — Z7901 Long term (current) use of anticoagulants: Secondary | ICD-10-CM | POA: Diagnosis not present

## 2021-07-27 DIAGNOSIS — R4182 Altered mental status, unspecified: Secondary | ICD-10-CM | POA: Diagnosis present

## 2021-07-27 LAB — CBC WITH DIFFERENTIAL/PLATELET
Abs Immature Granulocytes: 0.01 10*3/uL (ref 0.00–0.07)
Basophils Absolute: 0 10*3/uL (ref 0.0–0.1)
Basophils Relative: 1 %
Eosinophils Absolute: 0.2 10*3/uL (ref 0.0–0.5)
Eosinophils Relative: 4 %
HCT: 39.3 % (ref 39.0–52.0)
Hemoglobin: 12.7 g/dL — ABNORMAL LOW (ref 13.0–17.0)
Immature Granulocytes: 0 %
Lymphocytes Relative: 22 %
Lymphs Abs: 1.1 10*3/uL (ref 0.7–4.0)
MCH: 32.1 pg (ref 26.0–34.0)
MCHC: 32.3 g/dL (ref 30.0–36.0)
MCV: 99.2 fL (ref 80.0–100.0)
Monocytes Absolute: 0.4 10*3/uL (ref 0.1–1.0)
Monocytes Relative: 7 %
Neutro Abs: 3.3 10*3/uL (ref 1.7–7.7)
Neutrophils Relative %: 66 %
Platelets: 156 10*3/uL (ref 150–400)
RBC: 3.96 MIL/uL — ABNORMAL LOW (ref 4.22–5.81)
RDW: 13.3 % (ref 11.5–15.5)
WBC: 4.9 10*3/uL (ref 4.0–10.5)
nRBC: 0 % (ref 0.0–0.2)

## 2021-07-27 LAB — COMPREHENSIVE METABOLIC PANEL
ALT: 37 U/L (ref 0–44)
AST: 37 U/L (ref 15–41)
Albumin: 3.3 g/dL — ABNORMAL LOW (ref 3.5–5.0)
Alkaline Phosphatase: 76 U/L (ref 38–126)
Anion gap: 9 (ref 5–15)
BUN: 23 mg/dL (ref 8–23)
CO2: 24 mmol/L (ref 22–32)
Calcium: 9.3 mg/dL (ref 8.9–10.3)
Chloride: 109 mmol/L (ref 98–111)
Creatinine, Ser: 1.68 mg/dL — ABNORMAL HIGH (ref 0.61–1.24)
GFR, Estimated: 41 mL/min — ABNORMAL LOW (ref >60)
Glucose, Bld: 110 mg/dL — ABNORMAL HIGH (ref 70–99)
Potassium: 3.7 mmol/L (ref 3.5–5.1)
Sodium: 142 mmol/L (ref 135–145)
Total Bilirubin: 1.4 mg/dL — ABNORMAL HIGH (ref 0.3–1.2)
Total Protein: 6.1 g/dL — ABNORMAL LOW (ref 6.5–8.1)

## 2021-07-27 LAB — PROTIME-INR
INR: 1.4 — ABNORMAL HIGH (ref 0.8–1.2)
Prothrombin Time: 16.6 seconds — ABNORMAL HIGH (ref 11.4–15.2)

## 2021-07-27 LAB — LACTIC ACID, PLASMA: Lactic Acid, Venous: 3.1 mmol/L (ref 0.5–1.9)

## 2021-07-27 LAB — ETHANOL: Alcohol, Ethyl (B): <10 mg/dL (ref <10)

## 2021-07-27 LAB — MAGNESIUM: Magnesium: 2 mg/dL (ref 1.7–2.4)

## 2021-07-27 LAB — TROPONIN I (HIGH SENSITIVITY): Troponin I (High Sensitivity): 21 ng/L — ABNORMAL HIGH (ref <18)

## 2021-07-27 IMAGING — DX DG CHEST 1V PORT
1 series · 1 of 1 positions shown · non-contrast
Comparison: [DATE]

CLINICAL DATA: Altered mental status.

EXAM:
PORTABLE CHEST 1 VIEW

[chest ap]
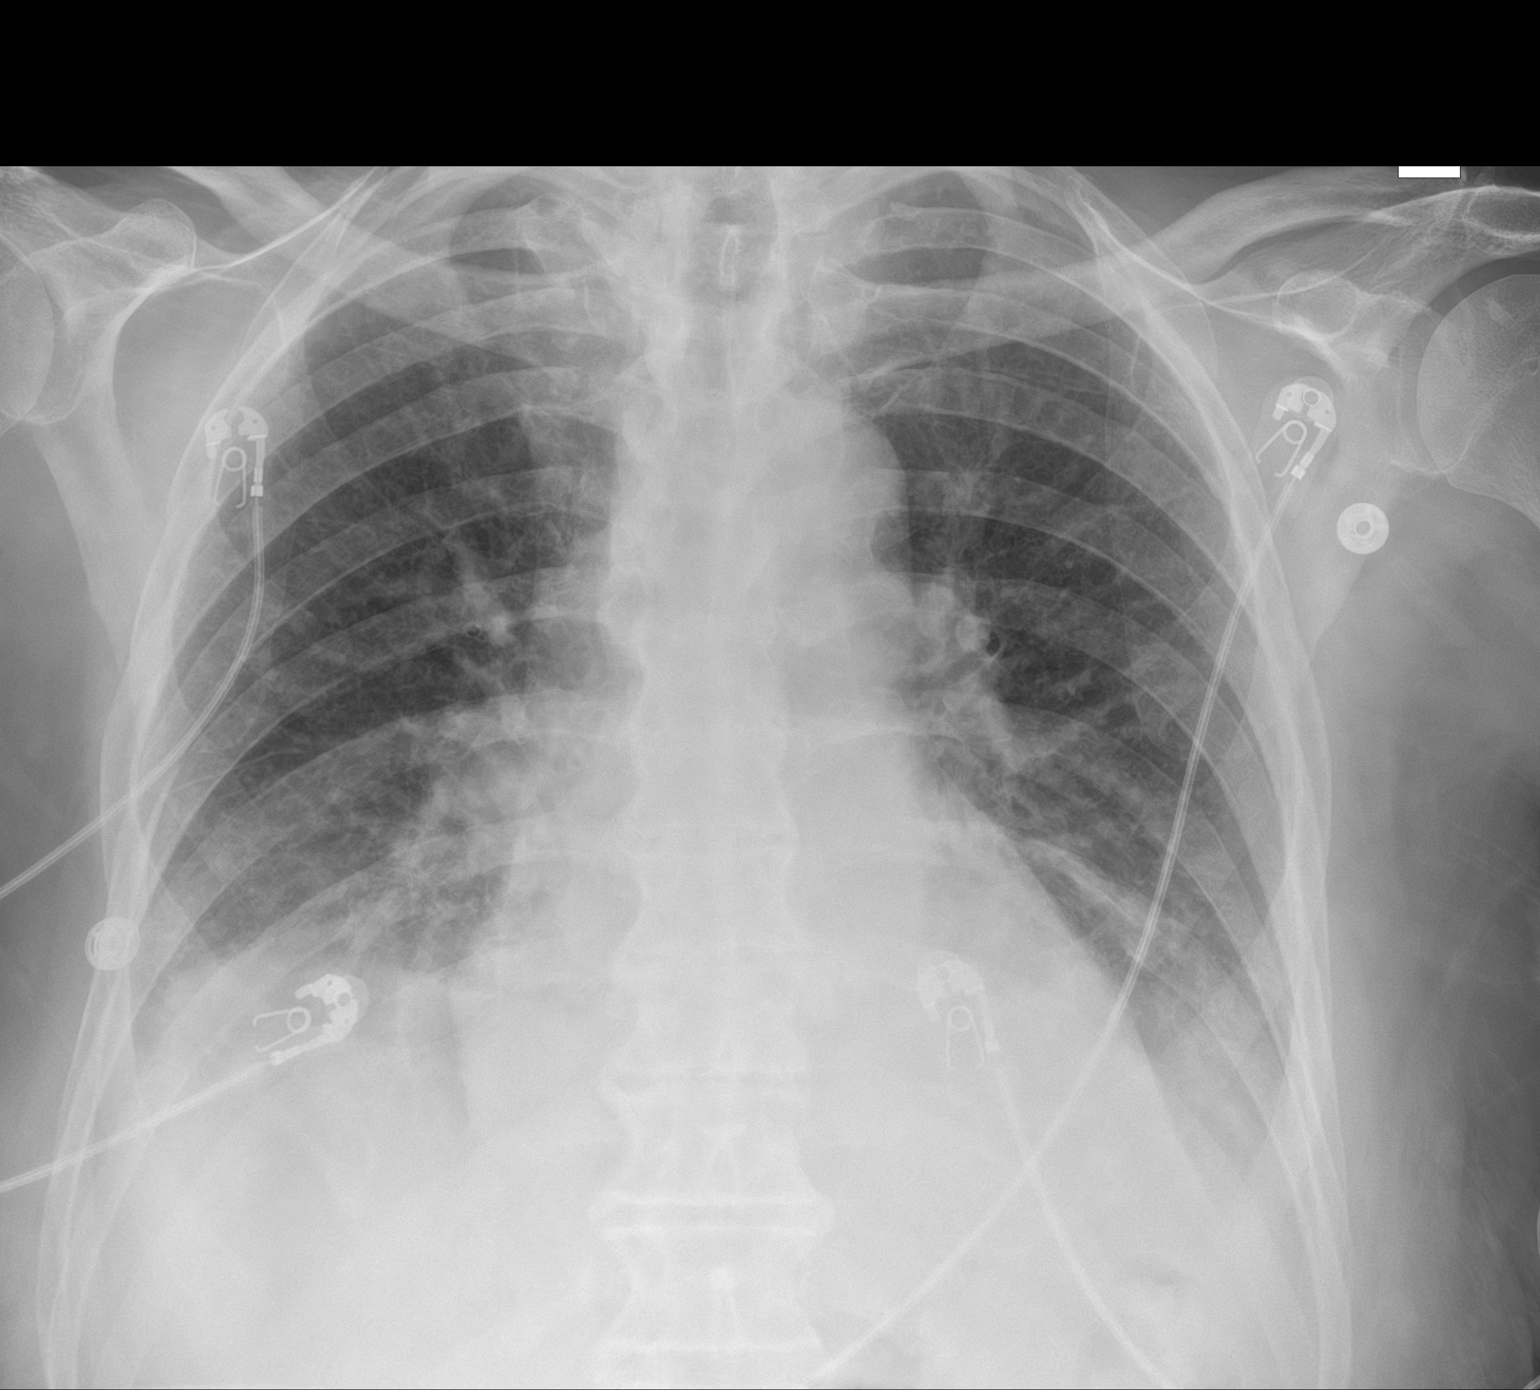

[1 of 1 positions shown; findings below may reference images not displayed]

FINDINGS: [MG] hours. Low volume film. The cardio pericardial silhouette is
enlarged. There is pulmonary vascular congestion without overt
pulmonary edema. Bibasilar collapse/consolidation with small
bilateral pleural effusions. Telemetry leads overlie the chest.
IMPRESSION: Bibasilar collapse/consolidation with small bilateral pleural
effusions.

## 2021-07-27 IMAGING — CT CT HEAD W/O CM
3 of 4 series · 13 of 47 positions shown, 15 images · non-contrast
Comparison: CT head [DATE].

CLINICAL DATA: Mental status change, unknown cause



[Series 2: head without · axial · non-contrast · 0.43mm/px · z∈[-89,+31]mm · 7 of 33 slices shown, 9 images]
[im 5/33  brain]
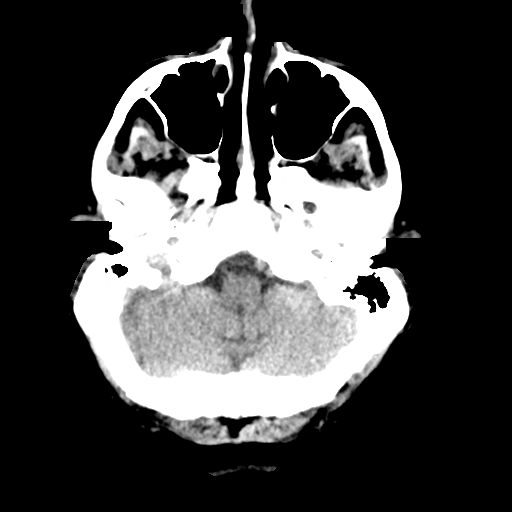
[im 5/33  bone]
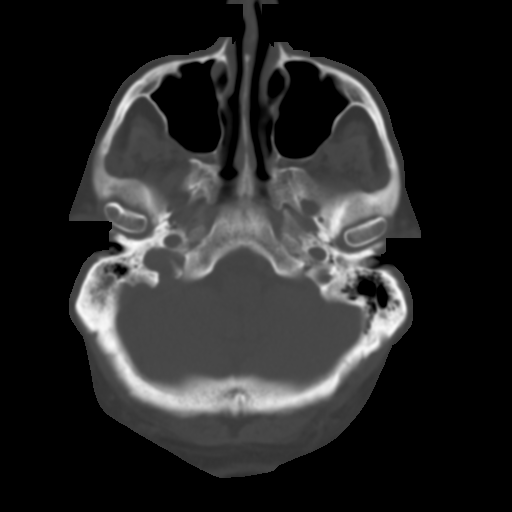
[im 9/33  brain]
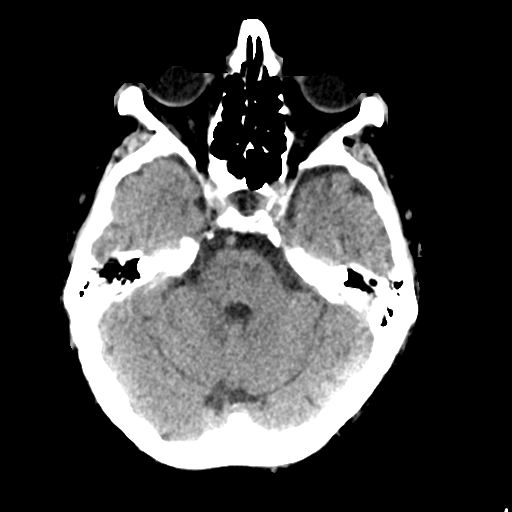
[im 13/33  brain]
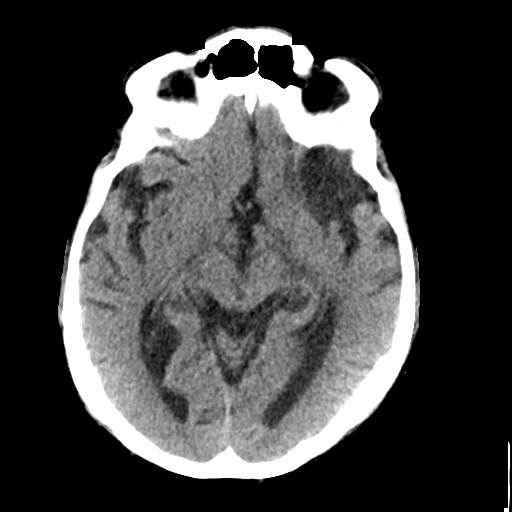
[im 17/33  brain]
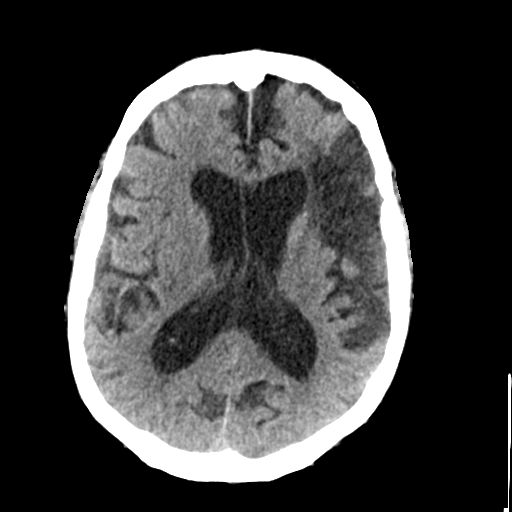
[im 21/33  brain]
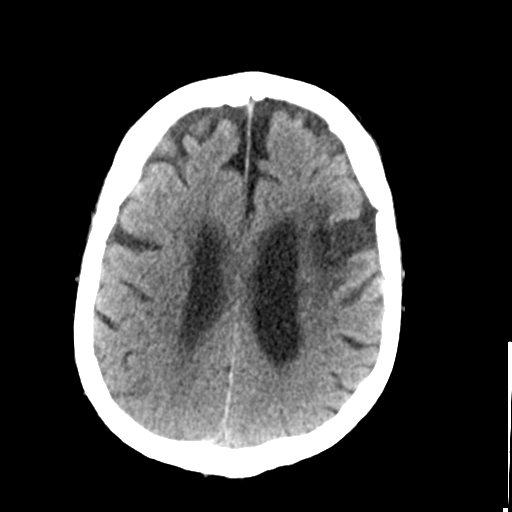
[im 21/33  bone]
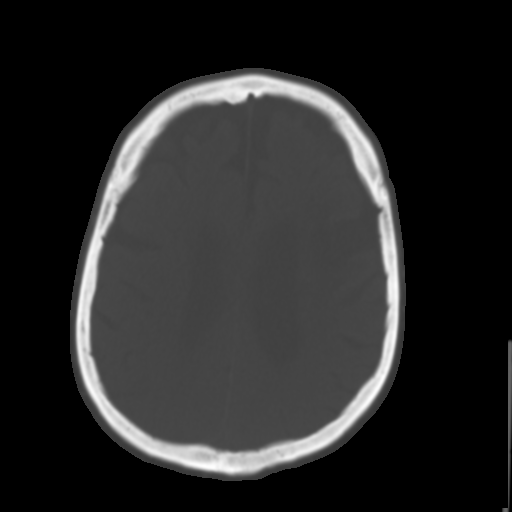
[im 25/33  brain]
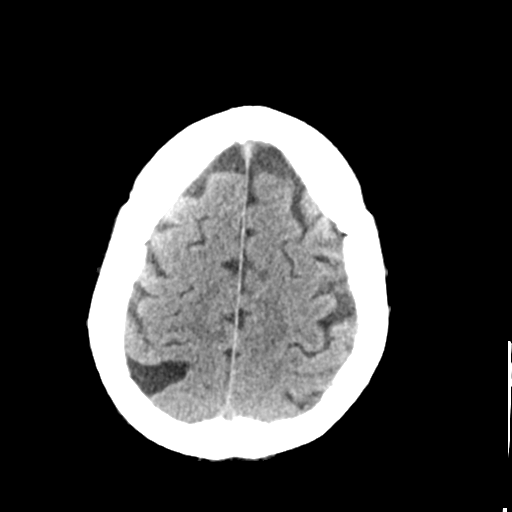
[im 29/33  brain]
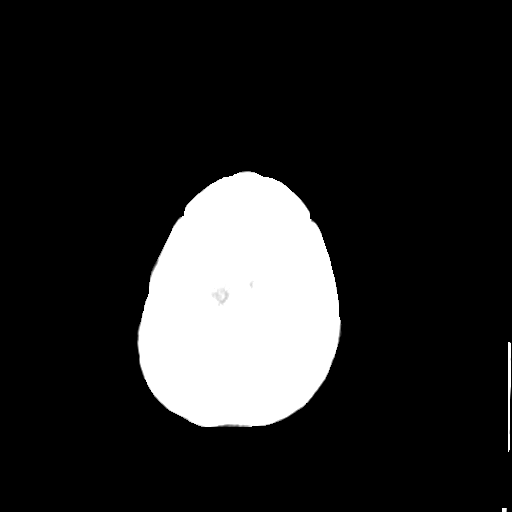

[Series 4: head without cor · coronal · non-contrast · 0.32mm/px · 3 of 67 slices shown]
[im 23/67  brain]
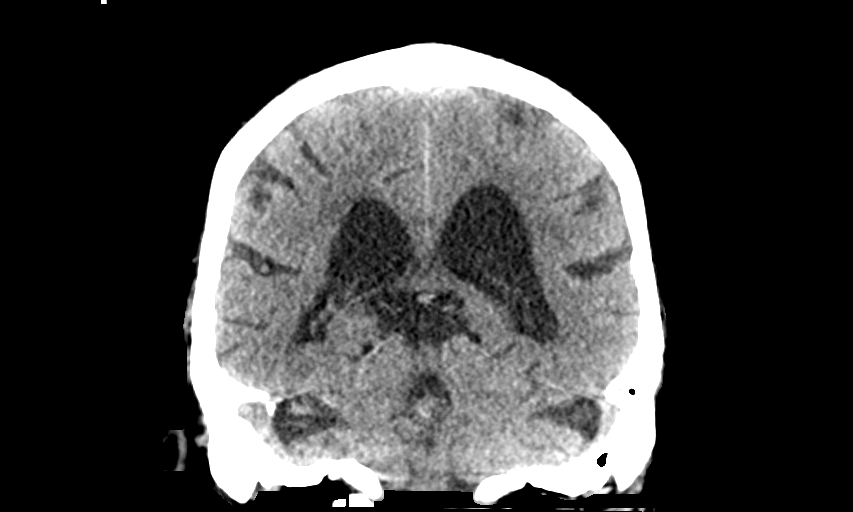
[im 30/67  brain]
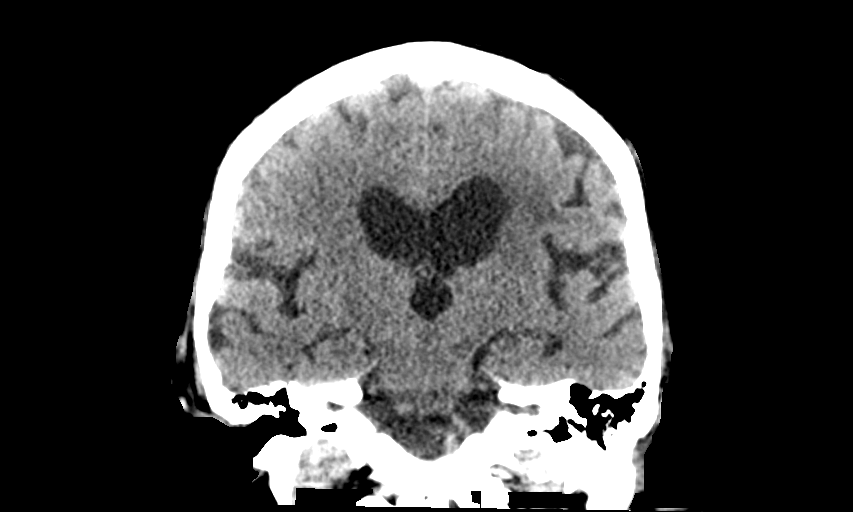
[im 37/67  brain]
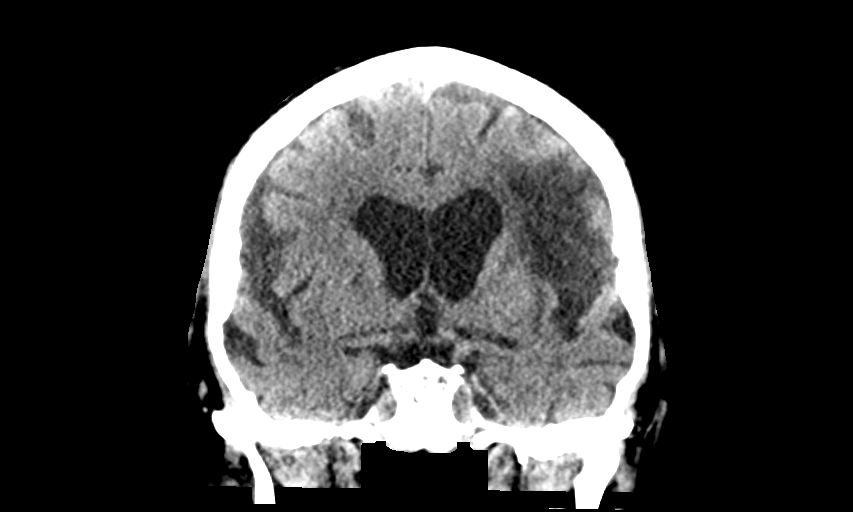

[Series 5: head without sag · sagittal · non-contrast · 0.32mm/px · 3 of 66 slices shown]
[im 22/66  brain]
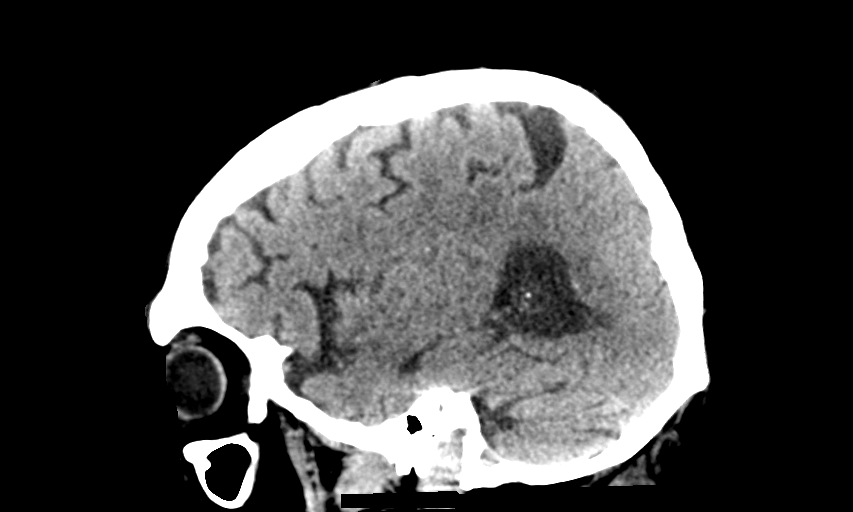
[im 33/66  brain]
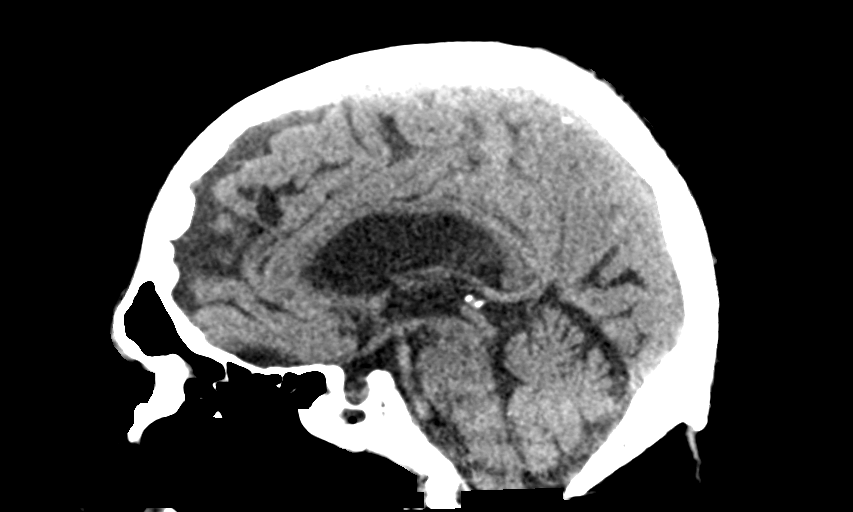
[im 44/66  brain]
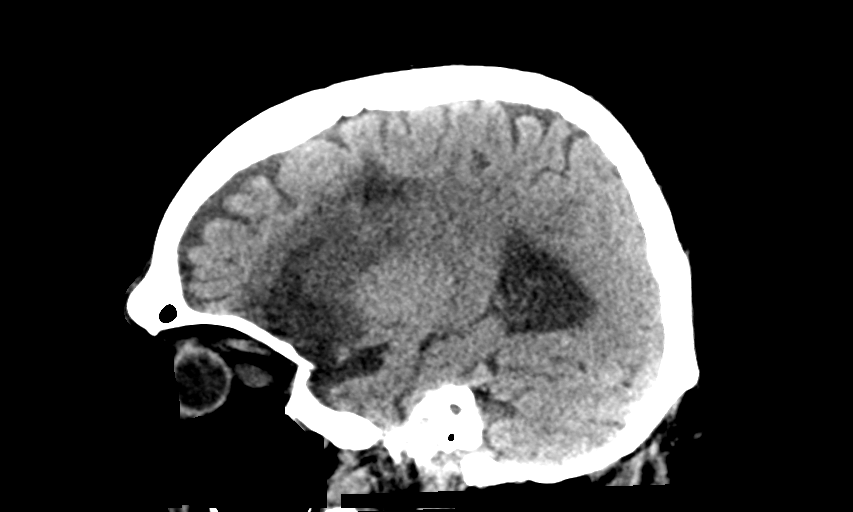

[13 of 47 positions shown; findings below may reference images not displayed]

FINDINGS: Brain: No evidence of acute infarction, hemorrhage, hydrocephalus,
extra-axial collection or mass lesion/mass effect. Remote left MCA
territory infarct. Remote cerebral atrophy.

Vascular: No hyperdense vessel identified. Calcific intracranial
atherosclerosis.

Skull: No acute fracture.

Sinuses/Orbits: Near complete right sphenoid sinus opacification
with areas of mineralization and osteitis, suggestive of chronic
sinusitis. No acute orbital findings

Other: No mastoid effusions.
IMPRESSION: 1. No evidence of acute intracranial abnormality.
2. Remote left MCA territory infarct.
3. Cerebral atrophy ([L7]-[L7]).
4. Findings suggestive of chronic left sphenoid sinusitis.
5.

## 2021-07-27 MED ORDER — LEVETIRACETAM 750 MG PO TABS: 750.0000 mg | ORAL_TABLET | Freq: Two times a day (BID) | ORAL | 2 refills | Status: DC

## 2021-07-27 MED ORDER — LEVETIRACETAM IN NACL 1500 MG/100ML IV SOLN
1500.0000 mg | Freq: Once | INTRAVENOUS | Status: AC
  Administered 2021-07-27: 1500 mg via INTRAVENOUS
  Filled 2021-07-27: qty 100

## 2021-07-27 NOTE — ED Notes (Signed)
Wife is At bedside-- states that she was talking to pt and he was standing up coming out of bathroom, then yelled and fell backwards onto bed--

## 2021-07-27 NOTE — Discharge Instructions (Signed)
There is a medication sent to your pharmacy.  This is a medication to prevent seizures.  Take as prescribed.  You should call the number below to set up a follow-up appointment with the neurologist.  If you experience any further episodes, please return to the emergency department.

## 2021-07-27 NOTE — ED Provider Notes (Signed)
[facility] EMERGENCY DEPARTMENT Provider Note   CSN: [id number] Arrival date & time: 07/27/21  8115     History  Chief Complaint  Patient presents with   Altered Mental Status    Edwin Wall is a 81 y.o. male.  HPI Patient presents from home, for altered mental status.  His last known normal was last night.  Family reports that he has not been complaining of any recent symptoms lately.  This morning, he seemed confused.  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  He is ambulatory at baseline.  He also reportedly has normal cognition at baseline.  Additional medical history includes HTN, gout, HLD, T2DM, atrial fibrillation, syncope, and ICM, anxiety, and arthritis.  Patient was brought in by EMS.  EMS personnel vital signs normal blood glucose prior to arrival.  On arrival, patient denies any areas of discomfort.  Per chart review, her medications include Eliquis, Lasix, and statin.  History per wife: Patient was in his normal state of health last night.  At baseline, he is ambulatory and conversant.  This morning, upon awakening, he also appeared to be normal.  He went to the bathroom.  When he got back in the bedroom, he raised his arms in the air, began screaming uncontrollably, and fell backwards onto the bed.  When he was on the bed, his eyes were closed and his chest was "jumping up and down".  Patient's wife is unclear if this was just heavy respirations or convulsions.  The patient does not have a known history of seizures, although patient's wife states that he had a similar episode last year.  This episode lasted for approximately 5 minutes.  As EMS was arriving at the home, patient ceased the screening and abnormal motions.  He appeared very sleepy and confused.  Currently, patient's wife states that he is back to his baseline.  Per chart review, patient had an admission in February for transient altered mental status.  He  underwent MRI and EEG at that time, results of which were reassuring.  In November, patient had admission following a fall followed by confusion.  This was attributed to syncope secondary to orthostatic hypotension.  He had a similar admission in October.  The previous screening episode of the wife describes occurred in September.  He was seen in the ED but discharged home at that time.    Home Medications Prior to Admission medications   Medication Sig Start Date End Date Taking? Authorizing Provider  apixaban (ELIQUIS) 2.5 MG TABS tablet Take 1 tablet (2.5 mg total) by mouth 2 (two) times daily. 12/20/20  Yes Turner, Eber Hong, MD  colestipol (COLESTID) 1 g tablet Take 1 tablet (1 g total) by mouth 2 (two) times daily. Patient taking differently: Take 1 g by mouth daily. 08/01/20  Yes Love, Ivan Anchors, PA-C  diclofenac Sodium (VOLTAREN) 1 % GEL Apply 4 g topically 4 (four) times daily. Patient taking differently: Apply 4 g topically daily as needed (pain). 04/15/21  Yes Lajean Manes, MD  ezetimibe (ZETIA) 10 MG tablet Take 1 tablet (10 mg total) by mouth daily. 04/19/21  Yes Turner, Eber Hong, MD  furosemide (LASIX) 20 MG tablet Take 1 tablet (20 mg total) by mouth every other day. 04/19/21  Yes Turner, Eber Hong, MD  levETIRAcetam (KEPPRA) 750 MG tablet Take 1 tablet (750 mg total) by mouth 2 (two) times daily. 07/27/21 10/25/21 Yes Godfrey Pick, MD  liver oil-zinc oxide (DESITIN)  40 % ointment Apply 1 application topically as needed for irritation.   Yes [provider]  neomycin-bacitracin-polymyxin (NEOSPORIN) ointment Apply 1 application. topically as needed for wound care.   Yes [provider]  Phenylephrine HCl (NEO-SYNEPHRINE NA) Place 1 spray into the nose daily as needed (congestion).   Yes [provider]  potassium chloride (KLOR-CON M) 10 MEQ tablet Take 10 mEq by mouth daily.   Yes [provider]  rosuvastatin (CRESTOR) 40 MG tablet Take 1 tablet (40 mg total)  by mouth daily. 08/01/20  Yes Love, Ivan Anchors, PA-C      Allergies    Patient has no known allergies.    Review of Systems   Review of Systems  Hematological:  Bruises/bleeds easily (On Eliquis).  Psychiatric/Behavioral:  Positive for confusion.   All other systems reviewed and are negative.  Physical Exam Updated Vital Signs BP (!) 155/85 (BP Location: Left Arm)   Pulse 65   Temp 98.9 F (37.2 C) (Oral)   Resp 15   SpO2 97%  Physical Exam Vitals and nursing note reviewed.  Constitutional:      General: He is not in acute distress.    Appearance: Normal appearance. He is well-developed. He is not ill-appearing, toxic-appearing or diaphoretic.  HENT:     Head: Normocephalic and atraumatic.     Right Ear: External ear normal.     Left Ear: External ear normal.     Nose: Nose normal.     Mouth/Throat:     Mouth: Mucous membranes are moist.     Pharynx: Oropharynx is clear.  Eyes:     General: No scleral icterus.    Extraocular Movements: Extraocular movements intact.     Conjunctiva/sclera: Conjunctivae normal.  Cardiovascular:     Rate and Rhythm: Normal rate.     Heart sounds: No murmur heard. Pulmonary:     Effort: Pulmonary effort is normal. No respiratory distress.     Breath sounds: Normal breath sounds. No wheezing or rales.  Chest:     Chest wall: No tenderness.  Abdominal:     Palpations: Abdomen is soft.     Tenderness: There is no abdominal tenderness.  Musculoskeletal:        General: No swelling or deformity. Normal range of motion.     Cervical back: Normal range of motion and neck supple. No rigidity.     Right lower leg: Edema (Slightly greater than left) present.     Left lower leg: Edema present.  Skin:    General: Skin is warm and dry.     Capillary Refill: Capillary refill takes less than 2 seconds.     Coloration: Skin is not jaundiced or pale.  Neurological:     General: No focal deficit present.     Mental Status: He is alert. He is  disoriented.     Cranial Nerves: No cranial nerve deficit.     Sensory: No sensory deficit.     Motor: No weakness.     Coordination: Coordination normal.  Psychiatric:        Mood and Affect: Mood normal.        Behavior: Behavior normal.    ED Results / Procedures / Treatments   Labs (all labs ordered are listed, but only abnormal results are displayed) Labs Reviewed  COMPREHENSIVE METABOLIC PANEL - Abnormal; Notable for the following components:      Result Value   Glucose, Bld 110 (*)    Creatinine, Ser  1.68 (*)    Total Protein 6.1 (*)    Albumin 3.3 (*)    Total Bilirubin 1.4 (*)    GFR, Estimated 41 (*)    All other components within normal limits  CBC WITH DIFFERENTIAL/PLATELET - Abnormal; Notable for the following components:   RBC 3.96 (*)    Hemoglobin 12.7 (*)    All other components within normal limits  LACTIC ACID, PLASMA - Abnormal; Notable for the following components:   Lactic Acid, Venous 3.1 (*)    All other components within normal limits  PROTIME-INR - Abnormal; Notable for the following components:   Prothrombin Time 16.6 (*)    INR 1.4 (*)    All other components within normal limits  TROPONIN I (HIGH SENSITIVITY) - Abnormal; Notable for the following components:   Troponin I (High Sensitivity) 21 (*)    All other components within normal limits  CULTURE, BLOOD (ROUTINE X 2)  CULTURE, BLOOD (ROUTINE X 2)  MAGNESIUM  ETHANOL  URINALYSIS, ROUTINE W REFLEX MICROSCOPIC  LACTIC ACID, PLASMA  RAPID URINE DRUG SCREEN, HOSP PERFORMED  TROPONIN I (HIGH SENSITIVITY)    EKG EKG Interpretation  Date/Time:  Saturday Jul 27 2021 12:19:10 EDT Ventricular Rate:  57 PR Interval:    QRS Duration: 147 QT Interval:  470 QTC Calculation: 458 R Axis:   -76 Text Interpretation: Atrial fibrillation RBBB and LAFB Confirmed by Godfrey Pick ([id number]) on 07/27/2021 4:11:08 PM  Radiology CT HEAD WO CONTRAST  Result Date: 07/27/2021 CLINICAL DATA:  Mental status  change, unknown cause EXAM: CT HEAD WITHOUT CONTRAST TECHNIQUE: Contiguous axial images were obtained from the base of the skull through the vertex without intravenous contrast. RADIATION DOSE REDUCTION: This exam was performed according to the departmental dose-optimization program which includes automated exposure control, adjustment of the mA and/or kV according to patient size and/or use of iterative reconstruction technique. COMPARISON:  CT head February 12, 23. FINDINGS: Brain: No evidence of acute infarction, hemorrhage, hydrocephalus, extra-axial collection or mass lesion/mass effect. Remote left MCA territory infarct. Remote cerebral atrophy. Vascular: No hyperdense vessel identified. Calcific intracranial atherosclerosis. Skull: No acute fracture. Sinuses/Orbits: Near complete right sphenoid sinus opacification with areas of mineralization and osteitis, suggestive of chronic sinusitis. No acute orbital findings Other: No mastoid effusions. IMPRESSION: 1. No evidence of acute intracranial abnormality. 2. Remote left MCA territory infarct. 3. Cerebral atrophy (ICD10-G31.9). 4. 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 5. Electronically Signed   By: Margaretha Sheffield M.D.   On: 07/27/2021 10:49   DG Chest Port 1 View  Result Date: 07/27/2021 CLINICAL DATA:  Altered mental status. EXAM: PORTABLE CHEST 1 VIEW COMPARISON:  04/14/2021 FINDINGS: 1010 hours. Low volume film. The cardio pericardial silhouette is enlarged. There is pulmonary vascular congestion without overt pulmonary edema. Bibasilar collapse/consolidation with small bilateral pleural effusions. Telemetry leads overlie the chest. IMPRESSION: Bibasilar collapse/consolidation with small bilateral pleural effusions. Electronically Signed   By: Misty Stanley M.D.   On: 07/27/2021 10:24    Procedures Procedures    Medications Ordered in ED Medications  levETIRAcetam (KEPPRA) IVPB 1500 mg/ 100 mL premix (1,500 mg Intravenous New  Bag/Given 07/27/21 1245)    ED Course/ Medical Decision Making/ A&P                           Medical Decision Making Amount and/or Complexity of Data Reviewed Labs: ordered. Radiology: ordered.  Risk Prescription drug management.   This patient presents  to the ED for concern of altered mental status, this involves an extensive number of treatment options, and is a complaint that carries with it a high risk of complications and morbidity.  The differential diagnosis includes seizure, CVA, TIA, polypharmacy, infection, metabolic abnormality   Co morbidities that complicate the patient evaluation  CVA, HTN, gout, HLD, T2DM, atrial fibrillation, syncope, and ICM, anxiety, and arthritis   Additional history obtained:  Additional history obtained from EMS, patient's wife External records from outside source obtained and reviewed including EMR   Lab Tests:  I Ordered, and personally interpreted labs.  The pertinent results include: Baseline CKD, normal electrolytes, baseline slight elevation in troponin, no leukocytosis, baseline hemoglobin.   Imaging Studies ordered:  I ordered imaging studies including CT head, chest x-ray I independently visualized and interpreted imaging which showed no acute findings I agree with the radiologist interpretation   Cardiac Monitoring: / EKG:  The patient was maintained on a cardiac monitor.  I personally viewed and interpreted the cardiac monitored which showed an underlying rhythm of: Atrial fibrillation   Consultations Obtained:  I requested consultation with the neurologist, Dr. Theda Sers,  and discussed lab and imaging findings as well as pertinent plan - they recommend: Initiation of Keppra for seizure prophylaxis and outpatient follow-up in neurology clinic   Problem List / ED Course / Critical interventions / Medication management  Patient is a 81 year old male presenting from home by EMS for altered mental status.  Additional  history is provided by his wife.  Patient's wife reports that he woke up in his normal state of health.  He had an episode during which he threw his arms in the air, screen, and then fell back onto the bed with some abnormal truncal movements.  She estimates that this lasted for 5 minutes and he had somnolence and confusion afterwards.  On arrival in the ED, he is awake and alert but disoriented.  Patient was placed on bedside cardiac monitor and laboratory work-up was initiated.  Patient's vital signs remained normal.  He underwent CT head which did not show any acute findings.  He does have a history of CVA.  On reassessment, patient has returned to mental baseline.  He is amnestic to the events prior to arrival.  Wife is present at bedside and does confirm that he is at his mental baseline.  Chest x-ray was ordered to assess for possible occult infection.  Findings were not consistent with pneumonia.  Patient's lab work was reassuring.  He has no leukocytosis.  His electrolytes are normal.  His kidney function is baseline.  He did have a lactic acidosis, which with history provided by his wife, is further suggestion that patient had a seizure episode.  Although he has not been diagnosed with a seizure disorder, he has had multiple ED visits and hospitalizations for LOC is followed by periods of confusion.  During his February hospitalization, he did undergo MRI and EEG.  Results of that time were negative.  Today, I spoke with neurologist on-call, Dr. Theda Sers.  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  He did recommend a 1500 mg Keppra load here in the ED and to initiate patient on 750 mg of Keppra twice daily.  Patient remained at mental baseline and asymptomatic during the remainder of his ED observation.  Patient and wife do feel comfortable with plan for discharge home and neurology clinic follow-up.  Patient was  discharged in stable  condition. I ordered medication including Keppra for seizure prophylaxis Reevaluation of the patient after these medicines showed that the patient resolved I have reviewed the patients home medicines and have made adjustments as needed   Social Determinants of Health:  Has access to outpatient care         Final Clinical Impression(s) / ED Diagnoses Final diagnoses:  Transient alteration of awareness    Rx / DC Orders ED Discharge Orders          Ordered    levETIRAcetam (KEPPRA) 750 MG tablet  2 times daily        07/27/21 1448              Godfrey Pick, MD 07/27/21 1616

## 2021-07-27 NOTE — ED Notes (Signed)
CRITICAL VALUE STICKER  CRITICAL VALUE:lactic acid 3.1  RECEIVER (on-site recipient of call):I.Alanson Hausmann  DATE & TIME NOTIFIED: 07/27/21 1108  MESSENGER (representative from lab):lab  MD NOTIFIED: [place]  RESPONSE:

## 2021-08-01 LAB — CULTURE, BLOOD (ROUTINE X 2)
Culture: NO GROWTH
Culture: NO GROWTH
Special Requests: ADEQUATE

## 2021-08-06 ENCOUNTER — Ambulatory Visit: Payer: Medicare Other | Admitting: Neurology

## 2021-08-13 ENCOUNTER — Encounter: Payer: Self-pay | Admitting: Neurology

## 2021-08-13 ENCOUNTER — Ambulatory Visit (INDEPENDENT_AMBULATORY_CARE_PROVIDER_SITE_OTHER): Payer: Medicare Other | Admitting: Neurology

## 2021-08-13 VITALS — BP 120/83 | HR 78 | Ht 68.0 in | Wt 187.0 lb

## 2021-08-13 DIAGNOSIS — I639 Cerebral infarction, unspecified: Secondary | ICD-10-CM | POA: Diagnosis not present

## 2021-08-13 DIAGNOSIS — G40209 Localization-related (focal) (partial) symptomatic epilepsy and epileptic syndromes with complex partial seizures, not intractable, without status epilepticus: Secondary | ICD-10-CM

## 2021-08-13 MED ORDER — LEVETIRACETAM 750 MG PO TABS: 750.0000 mg | ORAL_TABLET | Freq: Two times a day (BID) | ORAL | 3 refills | Status: DC

## 2021-08-13 NOTE — Progress Notes (Unsigned)
NEUROLOGY CONSULTATION NOTE  NIDAL RIVET MRN: [id number] DOB: 03-03-1941  Referring provider: Dr. Anastasia Pall Primary care provider: Dr. Anastasia Pall  Reason for consult:  seizure  Dear Dr Melford Aase:  Thank you for your kind referral of Vlad Mayberry Hargens for consultation of the above symptoms. Although his history is well known to you, please allow me to reiterate it for the purpose of our medical record. The patient was accompanied to the clinic by his wife who also provides collateral information. Records and images were personally reviewed where available.   HISTORY OF PRESENT ILLNESS: This is an 81 year old left-handed man with a history of hypertension, hyperlipidemia, DM, CHF, sigmoid colon CA s/p partial colectomy, left MCA stroke in 07/2020, presenting for evaluation of possible seizures. His wife is present to provide additional information. On 11/24/20, he was asleep when he woke up suddenly and both arms went straight up, he made a horrible yelling noise, eyes were closed, he was almost lifting himself off the bed, chest was lifting up,  and she could not wake him up. There was saliva coming out of his mouth. She called 911 and they could not get him awake. He was brought the the ER with note of low potassium, otherwise unremarkable bloodwork/imaging. He was in the ER in 12/2020 and 01/2021 for syncope, the first episode occurred as he was walking to the garage, he fell forward and hit his head on the grass. He passed out again when family brought him into the house. He was given IV fluids and K replacement in the ER. In 01/2021, he had gone to the bathroom and his wife heard him fall, noticed he was more confused that usual. He was noted to have markedly positive orthostatic vitals on admission. He was admitted in 04/2021 after his wife hard difficulty waking him up that morning, then he appeared confused. I personally reviewed brain MRI without contrast which did not show  any acute changes. There was a chronic left MCA infarct involving the left frontal lobe and insula with associated chronic hemosiderin deposition, moderate diffuse atrophy. EEG showed mild diffuse slowing. He was back in the ER on 07/27/21 when he again had an episode where he raised his arms in the air and began screaming uncontrollably after he came out of the bathroom. He fell backwards on to the bed, eyes were closed, saliva coming out his mouth, and his body was jumping up and down. This lasted 5 minutes, then he was very sleepy and confused. He returned to baseline in the ER and was amnestic of the episode. He was discharged home on Levetiracetam '750mg'$  BID. He denies any side effects, his wife notes he is a little weaker since hospitalization. He had previously lost up to 100 lbs and was gaining some weight back but lost a bit more recently. He sleeps a lot during the day, however this is not new since his stroke. He does not snore much. His wife feels he gets "too much sleep," he sleeps 10 hours at night and takes naps. His wife denies any staring spells. He denies any olfactory/gustatory hallucinations, deja vu, rising epigastric sensation, focal numbness/tingling/weakness, myoclonic jerks. He denies any headaches, dizziness, vision changes. He may have some difficulty swallowing, he needs to cut the Levetiracetam tablet in half and take it with applesauce. He has pain in his legs that improve once he gets up. He denies any bowel/bladder dysfunction, anosmia, or tremors. Last fall was 2 weeks ago. He  and his wife note memory is pretty good, but he notes that he lost his speech with word-finding difficulties after the stroke ("95% better with initial right-sided weakness). He continues to manage his own medications, his wife checks behind him. His wife has managed finances since the stroke. He denies misplacing things. He stopped driving after the stroke. His wife notes an incident the other day when he  ordered pizza and went to get tip money, the delivery person declined and later on he told his wife he did not want to give the garbage man any many. He lives with his wife and son, he states they have been married 41 years, his wife reminds him it is 48 years. No alcohol use. No family history of dementia or seizures. He had a normal birth and early development, no history of febrile seizures, CNS infections, significant head injuries.     PAST MEDICAL HISTORY: Past Medical History:  Diagnosis Date   Anxiety    Arthritis    Bilateral leg edema    Cataract    Colon cancer ([place]) dx'd 11/2016   "stage 3"   Gout    Gout    Hemorrhoids    History of kidney stones    "passed it"   Hypertension    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[place])    EF 45-50% by echo 10/22   PAF (paroxysmal atrial fibrillation) ([place])    Pre-diabetes     PAST SURGICAL HISTORY: Past Surgical History:  Procedure Laterality Date   CARDIOVERSION [name] 04/23/2021   Procedure: CARDIOVERSION;  Surgeon: [name], MD;  [facility] ENDOSCOPY;  Service: Cardiovascular;  Laterality: [name];   COLECTOMY  12/17/2016   lap; partial sigmoid colectomy/notes 12/17/2016   COLONOSCOPY W/ BIOPSIES AND POLYPECTOMY  11/11/2016   LAPAROSCOPIC SIGMOID COLECTOMY [name] 12/17/2016   Procedure: LAPAROSCOPIC SIGMOID COLECTOMY;  Surgeon: [name], MD;  Location: [place];  Service: General;  Laterality: [name];   LEFT HEART CATH AND CORONARY ANGIOGRAPHY [name] 03/11/2021   Procedure: LEFT HEART CATH AND CORONARY ANGIOGRAPHY;  Surgeon: [name], MD;  Location: [place] CV LAB;  Service: Cardiovascular;  Laterality: [name];   NASAL SEPTUM SURGERY     TONSILLECTOMY      MEDICATIONS: Current Outpatient Medications on File Prior to Visit  Medication Sig Dispense Refill   apixaban (ELIQUIS) 2.5 MG TABS tablet Take 1 tablet (2.5 mg total) by mouth 2 (two) times daily. (Patient taking differently: Take 2.5 mg by mouth 2 (two) times  daily. Take 1/2 tab in AM and 1/2 at night) 180 tablet 3   ezetimibe (ZETIA) 10 MG tablet Take 1 tablet (10 mg total) by mouth daily. 90 tablet 3   furosemide (LASIX) 20 MG tablet Take 1 tablet (20 mg total) by mouth every other day. 45 tablet 3   levETIRAcetam (KEPPRA) 750 MG tablet Take 1 tablet (750 mg total) by mouth 2 (two) times daily. 60 tablet 2   liver oil-zinc oxide (DESITIN) 40 % ointment Apply 1 application topically as needed for irritation.     neomycin-bacitracin-polymyxin (NEOSPORIN) ointment Apply 1 application. topically as needed for wound care.     Phenylephrine HCl (NEO-SYNEPHRINE NA) Place 1 spray into the nose daily as needed (congestion).     potassium chloride (KLOR-CON M) 10 MEQ tablet Take 10 mEq by mouth daily.     rosuvastatin (CRESTOR) 40 MG tablet Take 1 tablet (40 mg total) by mouth daily. 30 tablet 0   colestipol (COLESTID) 1 g tablet  Take 1 tablet (1 g total) by mouth 2 (two) times daily. (Patient taking differently: Take 1 g by mouth daily.) 30 tablet 0   diclofenac Sodium (VOLTAREN) 1 % GEL Apply 4 g topically 4 (four) times daily. (Patient not taking: Reported on 08/13/2021) 4 g 0   No current facility-administered medications on file prior to visit.    ALLERGIES: No Known Allergies  FAMILY HISTORY: Family History  Problem Relation Age of Onset   Cancer Mother        LUNG   COPD Father     SOCIAL HISTORY: Social History   Socioeconomic History   Marital status: Married    Spouse name: Not on file   Number of children: Not on file   Years of education: Not on file   Highest education level: Not on file  Occupational History   Not on file  Tobacco Use   Smoking status: Never   Smokeless tobacco: Never   Tobacco comments:    Never smoke 05/21/21  Vaping Use   Vaping Use: Never used  Substance and Sexual Activity   Alcohol use: No   Drug use: No   Sexual activity: Never  Other Topics Concern   Not on file  Social History Narrative    Right and left handed   Lives with son one story   Caffeine 2 daily   V[employer]an of [employer]    Social Determinants of Health   Financial Resource Strain: Not on file  Food Insecurity: Not on file  Transportation Needs: Not on file  Physical Activity: Not on file  Stress: Not on file  Social Connections: Not on file  Intimate Partner Violence: Not on file     PHYSICAL EXAM: Vitals:   08/13/21 1244  BP: 120/83  Pulse: 78  SpO2: 98%   General: No acute distress Head:  Normocephalic/atraumatic Skin/Extremities: No rash, no edema Neurological Exam: Mental status: alert and oriented to person, place. He states he is 81 years old. Mild word-finding difficulties, able to name and repeat.   Cranial nerves: CN I: not tested CN II: pupils equal, round and reactive to light, visual fields intact CN III, IV, VI:  full range of motion, no nystagmus, no ptosis CN V: facial sensation intact CN VII: upper and lower face symmetric CN VIII: hearing intact to conversation Bulk & Tone: normal, no fasciculations. Motor: 5/5 throughout with no pronator drift. Sensation: intact to light touch, cold, pin on both UE and Le. Decreased vibration sense to knees bilaterally.  Deep Tendon Reflexes: +1 throughout Cerebellar: no incoordination on finger to nose testing Gait: slow and cautious with cane.  Tremor: none   IMPRESSION: This is an 82 year old left-handed man with a history of hypertension, hyperlipidemia, DM, CHF, sigmoid colon CA s/p partial colectomy, left MCA stroke in 07/2020, presenting for evaluation of possible seizures. He has had 2 episodes of vocalizing with tonic activity of arms, most recently last 07/27/21. We discussed seizures likely secondary to prior stroke, continue Levetiracetam '750mg'$  BID. We discussed seizure precautions. [place] driving laws were discussed with the patient, and he knows to stop driving after a seizure, until 6 months seizure-free. Follow-up in 3 months, call for any  changes.    Thank you for allowing me to participate in the care of this patient. Please do not hesitate to call for any questions or concerns.   Ellouise Newer, M.D.   CC: Dr. Melford Aase

## 2021-08-13 NOTE — Patient Instructions (Signed)
Good to meet you.  Continue Levetiracetam (Keppra) '750mg'$  twice a day  2. Follow-up in 3 months, call for any changes   Seizure Precautions: 1. If medication has been prescribed for you to prevent seizures, take it exactly as directed.  Do not stop taking the medicine without talking to your doctor first, even if you have not had a seizure in a long time.   2. Avoid activities in which a seizure would cause danger to yourself or to others.  Don't operate dangerous machinery, swim alone, or climb in high or dangerous places, such as on ladders, roofs, or girders.  Do not drive unless your doctor says you may.  3. If you have any warning that you may have a seizure, lay down in a safe place where you can't hurt yourself.    4.  No driving for 6 months from last seizure, as per [facility].   Please refer to the following link on the [place] website for more information: http://[email or web address]/[email or web address]/drivingu.cfm   5.  Maintain good sleep hygiene.  6.  Contact your doctor if you have any problems that may be related to the medicine you are taking.  7.  Call 911 and bring the patient back to the ED if:        A.  The seizure lasts longer than 5 minutes.       B.  The patient doesn't awaken shortly after the seizure  C.  The patient has new problems such as difficulty seeing, speaking or moving  D.  The patient was injured during the seizure  E.  The patient has a temperature over 102 F (39C)  F.  The patient vomited and now is having trouble breathing

## 2021-08-20 ENCOUNTER — Ambulatory Visit (HOSPITAL_COMMUNITY): Payer: Medicare Other | Admitting: Physician Assistant

## 2021-08-27 ENCOUNTER — Encounter (HOSPITAL_COMMUNITY): Payer: Self-pay | Admitting: Physician Assistant

## 2021-08-27 ENCOUNTER — Ambulatory Visit (HOSPITAL_COMMUNITY)
Admission: RE | Admit: 2021-08-27 | Discharge: 2021-08-27 | Disposition: A | Discharge status: Home / Self Care | Payer: Medicare Other | Source: Ambulatory Visit | Attending: Physician Assistant | Admitting: Physician Assistant

## 2021-08-27 VITALS — BP 102/70 | HR 59 | Ht 68.0 in | Wt 187.4 lb

## 2021-08-27 DIAGNOSIS — D6869 Other thrombophilia: Secondary | ICD-10-CM

## 2021-08-27 DIAGNOSIS — I4819 Other persistent atrial fibrillation: Secondary | ICD-10-CM | POA: Insufficient documentation

## 2021-08-27 DIAGNOSIS — Z7901 Long term (current) use of anticoagulants: Secondary | ICD-10-CM | POA: Diagnosis not present

## 2021-08-27 DIAGNOSIS — I5022 Chronic systolic (congestive) heart failure: Secondary | ICD-10-CM | POA: Diagnosis not present

## 2021-08-27 DIAGNOSIS — N189 Chronic kidney disease, unspecified: Secondary | ICD-10-CM | POA: Insufficient documentation

## 2021-08-27 DIAGNOSIS — I129 Hypertensive chronic kidney disease with stage 1 through stage 4 chronic kidney disease, or unspecified chronic kidney disease: Secondary | ICD-10-CM | POA: Diagnosis not present

## 2021-08-27 DIAGNOSIS — Z8673 Personal history of transient ischemic attack (TIA), and cerebral infarction without residual deficits: Secondary | ICD-10-CM | POA: Insufficient documentation

## 2021-08-27 NOTE — Progress Notes (Signed)
Primary Care Physician: Eartha Inch, MD Primary Cardiologist: Dr Mayford Knife Primary Electrophysiologist: none Referring Physician: Dr Ammie Ferrier is a 81 y.o. male with a history of colon cancer s/p sigmoid colectomy 2018, CKD, CVA, HTN, atrial fibrillation who presents for follow up in the [facility].  The patient was initially diagnosed with atrial fibrillation 07/2020 in the setting of acute CVA. Patient is on Eliquis for a CHADS2VASC score of 5. Patient was admitted 12/23/20 with a mechanical fall. patient was carrying a heavy load and walking down the back porch when he fell.  Wife states that he did not lose consciousness and was able to get up with help from his son.  Patient came back inside and rested on a chair.  30 minutes later wife came back and found him difficult to arouse and called EMS.  Patient states he does not remember the event but also states that he did not lose consciousness. Echo showed mildly reduced EF 45-50% with severe akineses of apical anteroseptal wall and apical segment. Patient underwent LHC on 03/11/21 which showed mild-mod nonobstructive CAD, medical therapy recommended.  Patient is s/p DCCV on 04/23/21 with early return of afib. Loaded on amiodarone but this was later discontinued due to off target effects (increased dizziness).  Patient presented to the ED 07/27/21 via EMS with altered mental status. His wife reports that he went to the bathroom and when he got back in the bedroom, he raised his arms in the air, began screaming uncontrollably, and fell backwards onto the bed.  When he was on the bed, his eyes were closed and his chest was "jumping up and down". When EMS arrived he was very sleepy and confused. 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 He was started on Keppra.   On follow up today, patient reports that he feels well today. He states  that he is unaware of his afib and felt no different in SR. No bleeding issues on anticoagulation.   Today, he denies symptoms of palpitations, chest pain, shortness of breath, orthopnea, PND, lower extremity edema, presyncope, syncope, snoring, daytime somnolence, bleeding.The patient is tolerating medications without difficulties and is otherwise without complaint today.    Atrial Fibrillation Risk Factors:  he does not have symptoms or diagnosis of sleep apnea. he does not have a history of rheumatic fever. he does not have a history of alcohol use.   he has a BMI of Body mass index is 28.49 kg/m.Marland Kitchen Filed Weights   08/27/21 1047  Weight: 85 kg    Family History  Problem Relation Age of Onset   Cancer Mother        LUNG   COPD Father      Atrial Fibrillation Management history:  Previous antiarrhythmic drugs: amiodarone  Previous cardioversions: 04/23/21 Previous ablations: none CHADS2VASC score: 5 Anticoagulation history: Eliquis   Past Medical History:  Diagnosis Date   Anxiety    Arthritis    Bilateral leg edema    Cataract    Colon cancer ([facility]) dx'd 11/2016   "stage 3"   Gout    Gout    Hemorrhoids    History of kidney stones    "passed it"   Hypertension    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[facility])    EF 45-50% by echo 10/22   PAF (paroxysmal atrial fibrillation) ([facility])    Pre-diabetes  Past Surgical History:  Procedure Laterality Date   CARDIOVERSION [name] 04/23/2021   Procedure: CARDIOVERSION;  Surgeon: Little Ishikawa, MD;  Location: [facility] ENDOSCOPY;  Service: Cardiovascular;  Laterality: [name];   COLECTOMY  12/17/2016   lap; partial sigmoid colectomy/notes 12/17/2016   COLONOSCOPY W/ BIOPSIES AND POLYPECTOMY  11/11/2016   LAPAROSCOPIC SIGMOID COLECTOMY [name] 12/17/2016   Procedure: LAPAROSCOPIC SIGMOID COLECTOMY;  Surgeon: Almond Lint, MD;  Location: [facility] OR;  Service: General;  Laterality: [name];   LEFT HEART CATH AND CORONARY ANGIOGRAPHY [name]  03/11/2021   Procedure: LEFT HEART CATH AND CORONARY ANGIOGRAPHY;  Surgeon: Yvonne Kendall, MD;  Location: [facility] INVASIVE CV LAB;  Service: Cardiovascular;  Laterality: [name];   NASAL SEPTUM SURGERY     TONSILLECTOMY      Current Outpatient Medications  Medication Sig Dispense Refill   apixaban (ELIQUIS) 2.5 MG TABS tablet Take 1 tablet (2.5 mg total) by mouth 2 (two) times daily. (Patient taking differently: Take 2.5 mg by mouth 2 (two) times daily. Take 1/2 tab in AM and 1/2 at night) 180 tablet 3   colestipol (COLESTID) 1 g tablet Take 1 tablet (1 g total) by mouth 2 (two) times daily. (Patient taking differently: Take 1 g by mouth daily.) 30 tablet 0   diclofenac Sodium (VOLTAREN) 1 % GEL Apply 4 g topically 4 (four) times daily. 4 g 0   ezetimibe (ZETIA) 10 MG tablet Take 1 tablet (10 mg total) by mouth daily. 90 tablet 3   furosemide (LASIX) 20 MG tablet Take 1 tablet (20 mg total) by mouth every other day. 45 tablet 3   levETIRAcetam (KEPPRA) 750 MG tablet Take 1 tablet (750 mg total) by mouth 2 (two) times daily. 180 tablet 3   liver oil-zinc oxide (DESITIN) 40 % ointment Apply 1 application topically as needed for irritation.     neomycin-bacitracin-polymyxin (NEOSPORIN) ointment Apply 1 application. topically as needed for wound care.     Phenylephrine HCl (NEO-SYNEPHRINE NA) Place 1 spray into the nose daily as needed (congestion).     potassium chloride (KLOR-CON M) 10 MEQ tablet Take 10 mEq by mouth daily.     rosuvastatin (CRESTOR) 40 MG tablet Take 1 tablet (40 mg total) by mouth daily. 30 tablet 0   No current facility-administered medications for this encounter.    No Known Allergies  Social History   Socioeconomic History   Marital status: Married    Spouse name: Not on file   Number of children: Not on file   Years of education: Not on file   Highest education level: Not on file  Occupational History   Not on file  Tobacco Use   Smoking status: Never   Smokeless  tobacco: Never   Tobacco comments:    Never smoke 05/21/21  Vaping Use   Vaping Use: Never used  Substance and Sexual Activity   Alcohol use: No   Drug use: No   Sexual activity: Never  Other Topics Concern   Not on file  Social History Narrative   Right and left handed   Lives with son one story   Caffeine 2 daily   Veteran of [employer]    Social Determinants of Health   Financial Resource Strain: Not on file  Food Insecurity: Not on file  Transportation Needs: Not on file  Physical Activity: Not on file  Stress: Not on file  Social Connections: Not on file  Intimate Partner Violence: Not on file     ROS- All systems  are reviewed and negative except as per the HPI above.  Physical Exam: Vitals:   08/27/21 1047  BP: 102/70  Pulse: (!) 59  Weight: 85 kg  Height: 5\' 8"  (1.727 m)     GEN- The patient is a well appearing elderly male, alert and oriented x 3 today.   HEENT-head normocephalic, atraumatic, sclera clear, conjunctiva pink, hearing intact, trachea midline. Lungs- Clear to ausculation bilaterally, normal work of breathing Heart- irregular rate and rhythm, no murmurs, rubs or gallops  GI- soft, NT, ND, + BS Extremities- no clubbing, cyanosis, or edema MS- no significant deformity or atrophy Skin- no rash or lesion Psych- euthymic mood, full affect Neuro- strength and sensation are intact   Wt Readings from Last 3 Encounters:  08/27/21 85 kg  08/13/21 84.8 kg  07/05/21 96.6 kg    EKG today demonstrates  Afib, RBBB, LAFB Vent. rate 59 BPM PR interval * ms QRS duration 142 ms QT/QTcB 436/431 ms  Echo 12/24/20 demonstrated   1. Left ventricular ejection fraction, by estimation, is 45 to 50%. The  left ventricle has mildly decreased function. The left ventricle  demonstrates regional wall motion abnormalities (see scoring  diagram/findings for description). The left ventricular   internal cavity size was mildly dilated. Left ventricular diastolic   function could not be evaluated. There is severe akinesis of the left  ventricular, apical anteroseptal wall and apical segment.   2. Right ventricular systolic function is normal. The right ventricular  size is normal.   3. Left atrial size was moderately dilated.   4. Right atrial size was mildly dilated.   5. The mitral valve is grossly normal. Mild mitral valve regurgitation.   6. The aortic valve is normal in structure. Aortic valve regurgitation is  not visualized. No aortic stenosis is present.   Epic records are reviewed at length today  CHA2DS2-VASc Score = 5  The patient's score is based upon: CHF History: 0 HTN History: 1 Diabetes History: 0 Stroke History: 2 Vascular Disease History: 0 Age Score: 2 Gender Score: 0       ASSESSMENT AND PLAN: 1. Persistent Atrial Fibrillation (ICD10:  I48.19) The patient's CHA2DS2-VASc score is 5, indicating a 7.2% annual risk of stroke.   We discussed rate vs rhythm control again today. We specifically discussed alternate AAD (dofetilide) vs ablation. Patient is clear in his decision to pursue a conservative rate control strategy.  Continue Eliquis 2.5 mg BID  2. Secondary Hypercoagulable State (ICD10:  D68.69) The patient is at significant risk for stroke/thromboembolism based upon his CHA2DS2-VASc Score of 5.  Continue Apixaban (Eliquis).   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 Not on ACE inhibitor/ARB/Entresto due to CKD No signs or symptoms of fluid overload today.  4. HTN Stable, no changes today.   Follow up with Dr Mayford Knife per recall. AF clinic as needed.    Jorja Loa PA-C Afib Clinic [facility] [address] [place], [place] [address] [phone] 08/27/2021 10:57 AM

## 2021-11-12 ENCOUNTER — Ambulatory Visit: Payer: Medicare Other | Admitting: Neurology

## 2021-12-13 ENCOUNTER — Ambulatory Visit (INDEPENDENT_AMBULATORY_CARE_PROVIDER_SITE_OTHER): Payer: Medicare Other | Admitting: Neurology

## 2021-12-13 ENCOUNTER — Encounter: Payer: Self-pay | Admitting: Neurology

## 2021-12-13 VITALS — BP 147/81 | HR 66 | Ht 68.5 in | Wt 203.0 lb

## 2021-12-13 DIAGNOSIS — I639 Cerebral infarction, unspecified: Secondary | ICD-10-CM | POA: Diagnosis not present

## 2021-12-13 DIAGNOSIS — G40209 Localization-related (focal) (partial) symptomatic epilepsy and epileptic syndromes with complex partial seizures, not intractable, without status epilepticus: Secondary | ICD-10-CM | POA: Diagnosis not present

## 2021-12-13 MED ORDER — LEVETIRACETAM 750 MG PO TABS: 750.0000 mg | ORAL_TABLET | Freq: Two times a day (BID) | ORAL | 3 refills | Status: DC

## 2021-12-13 NOTE — Progress Notes (Signed)
NEUROLOGY FOLLOW UP OFFICE NOTE  Edwin MCALEXANDER [id number] [birth year]  HISTORY OF PRESENT ILLNESS: I had the pleasure of seeing Edwin Wall in follow-up in the neurology clinic on 12/13/2021.  The patient was last seen 4 months ago for possible seizures. His wife is again present to provide additional information. He had 2 episodes of vocalization with arm stiffening, most recently in 07/2021. He is on Levetiracetam '750mg'$  BID without side effects. Since his last visit, they report that he is doing well with no seizures or seizure-like symptoms since 07/2021. No staring/unresponsive episodes, gaps in time, olfactory/gustatory hallucinations, focal numbness/tingling/weakness, myoclonic jerks. No headaches, dizziness, vision changes, no falls. Mood and sleep are good.    History on Initial Assessment 08/13/2021: This is an 81 year old left-handed man with a history of hypertension, hyperlipidemia, DM, CHF, sigmoid colon CA s/p partial colectomy, left MCA stroke in 07/2020, presenting for evaluation of possible seizures. His wife is present to provide additional information. On 11/24/20, he was asleep when he woke up suddenly and both arms went straight up, he made a horrible yelling noise, eyes were closed, he was almost lifting himself off the bed, chest was lifting up,  and she could not wake him up. There was saliva coming out of his mouth. She called 911 and they could not get him awake. He was brought the the ER with note of low potassium, otherwise unremarkable bloodwork/imaging. He was in the ER in 12/2020 and 01/2021 for syncope, the first episode occurred as he was walking to the garage, he fell forward and hit his head on the grass. He passed out again when family brought him into the house. He was given IV fluids and K replacement in the ER. In 01/2021, he had gone to the bathroom and his wife heard him fall, noticed he was more confused that usual. He was noted to have markedly positive  orthostatic vitals on admission. He was admitted in 04/2021 after his wife hard difficulty waking him up that morning, then he appeared confused. I personally reviewed brain MRI without contrast which did not show any acute changes. There was a chronic left MCA infarct involving the left frontal lobe and insula with associated chronic hemosiderin deposition, moderate diffuse atrophy. EEG showed mild diffuse slowing. He was back in the ER on 07/27/21 when he again had an episode where he raised his arms in the air and began screaming uncontrollably after he came out of the bathroom. He fell backwards on to the bed, eyes were closed, saliva coming out his mouth, and his body was jumping up and down. This lasted 5 minutes, then he was very sleepy and confused. He returned to baseline in the ER and was amnestic of the episode. He was discharged home on Levetiracetam '750mg'$  BID. He denies any side effects, his wife notes he is a little weaker since hospitalization. He had previously lost up to 100 lbs and was gaining some weight back but lost a bit more recently. He sleeps a lot during the day, however this is not new since his stroke. He does not snore much. His wife feels he gets "too much sleep," he sleeps 10 hours at night and takes naps. His wife denies any staring spells. He denies any olfactory/gustatory hallucinations, deja vu, rising epigastric sensation, focal numbness/tingling/weakness, myoclonic jerks. He denies any headaches, dizziness, vision changes. He may have some difficulty swallowing, he needs to cut the Levetiracetam tablet in half and take it with applesauce. He  has pain in his legs that improve once he gets up. He denies any bowel/bladder dysfunction, anosmia, or tremors. Last fall was 2 weeks ago. He and his wife note memory is pretty good, but he notes that he lost his speech with word-finding difficulties after the stroke ("95% better with initial right-sided weakness). He continues to manage his  own medications, his wife checks behind him. His wife has managed finances since the stroke. He denies misplacing things. He stopped driving after the stroke. His wife notes an incident the other day when he ordered pizza and went to get tip money, the delivery person declined and later on he told his wife he did not want to give the garbage man any many. He lives with his wife and son, he states they have been married 67 years, his wife reminds him it is 34 years. No alcohol use. No family history of dementia or seizures. He had a normal birth and early development, no history of febrile seizures, CNS infections, significant head injuries.   Diagnostic Data: 04/2021:MRI without contrast did not show any acute changes. There was a chronic left MCA infarct involving the left frontal lobe and insula with associated chronic hemosiderin deposition, moderate diffuse atrophy. EEG showed mild diffuse slowing.  PAST MEDICAL HISTORY: Past Medical History:  Diagnosis Date   Anxiety    Arthritis    Bilateral leg edema    Cataract    Colon cancer ([place]) dx'd 11/2016   "stage 3"   Gout    Gout    Hemorrhoids    History of kidney stones    "passed it"   Hypertension    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[place])    EF 45-50% by echo 10/22   PAF (paroxysmal atrial fibrillation) ([facility])    Pre-diabetes     MEDICATIONS: Current Outpatient Medications on File Prior to Visit  Medication Sig Dispense Refill   apixaban (ELIQUIS) 2.5 MG TABS tablet Take 1 tablet (2.5 mg total) by mouth 2 (two) times daily. (Patient taking differently: Take 2.5 mg by mouth 2 (two) times daily. Take 1/2 tab in AM and 1/2 at night) 180 tablet 3   colestipol (COLESTID) 1 g tablet Take 1 tablet (1 g total) by mouth 2 (two) times daily. (Patient taking differently: Take 1 g by mouth daily.) 30 tablet 0   diclofenac Sodium (VOLTAREN) 1 % GEL Apply 4 g topically 4 (four) times daily. 4 g 0   ezetimibe (ZETIA) 10 MG tablet Take 1 tablet (10  mg total) by mouth daily. 90 tablet 3   furosemide (LASIX) 20 MG tablet Take 1 tablet (20 mg total) by mouth every other day. 45 tablet 3   levETIRAcetam (KEPPRA) 750 MG tablet Take 1 tablet (750 mg total) by mouth 2 (two) times daily. 180 tablet 3   liver oil-zinc oxide (DESITIN) 40 % ointment Apply 1 application topically as needed for irritation.     neomycin-bacitracin-polymyxin (NEOSPORIN) ointment Apply 1 application. topically as needed for wound care.     Phenylephrine HCl (NEO-SYNEPHRINE NA) Place 1 spray into the nose daily as needed (congestion).     potassium chloride (KLOR-CON M) 10 MEQ tablet Take 10 mEq by mouth daily.     rosuvastatin (CRESTOR) 40 MG tablet Take 1 tablet (40 mg total) by mouth daily. 30 tablet 0   No current facility-administered medications on file prior to visit.    ALLERGIES: No Known Allergies  FAMILY HISTORY: Family History  Problem Relation Age of Onset  Cancer Mother        LUNG   COPD Father     SOCIAL HISTORY: Social History   Socioeconomic History   Marital status: Married    Spouse name: Not on file   Number of children: Not on file   Years of education: Not on file   Highest education level: Not on file  Occupational History   Not on file  Tobacco Use   Smoking status: Never   Smokeless tobacco: Never   Tobacco comments:    Never smoke 05/21/21  Vaping Use   Vaping Use: Never used  Substance and Sexual Activity   Alcohol use: No   Drug use: No   Sexual activity: Never  Other Topics Concern   Not on file  Social History Narrative   Right and left handed   Lives with son one story   Caffeine 2 daily   V[employer]an of [employer]    Social Determinants of Health   Financial Resource Strain: Not on file  Food Insecurity: Not on file  Transportation Needs: Not on file  Physical Activity: Not on file  Stress: Not on file  Social Connections: Not on file  Intimate Partner Violence: Not on file     PHYSICAL EXAM: Vitals:    12/13/21 1522  BP: (!) 147/81  Pulse: 66  SpO2: 98%   General: No acute distress Head:  Normocephalic/atraumatic Skin/Extremities: No rash, no edema Neurological Exam: alert and awake. No aphasia or dysarthria. Fund of knowledge is appropriate. Attention and concentration are normal.   Cranial nerves: Pupils equal, round. Extraocular movements intact with no nystagmus. Visual fields full.  No facial asymmetry.  Motor: Bulk and tone normal, muscle strength 5/5 throughout with no pronator drift.   Finger to nose testing intact.  Gait narrow-based and steady, no ataxia.   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 MRI without contrast did not show any acute changes. There was a chronic left MCA infarct involving the left frontal lobe and insula with associated chronic hemosiderin deposition, moderate diffuse atrophy. EEG showed mild diffuse slowing. He is on Levetiracetam '750mg'$  BID with no further seizures since 07/2021. He is aware of [place] driving laws to stop driving until 6 months seizure-free. They are interested in a driving evaluation before he resumes driving, information provided. Continue Eliquis and control of vascular risk factors for secondary stroke prevention. Follow-up in 6 months, call for any changes.   Thank you for allowing me to participate in his care.  Please do not hesitate to call for any questions or concerns.    Ellouise Newer, M.D.   CC: Dr. Melford Aase

## 2021-12-13 NOTE — Patient Instructions (Signed)
Good to see you doing well.  Continue Levetiracetam (Keppra) '750mg'$ : take 1 tablet twice a day  2. If you are interested in the driving assessment, you can contact the following:  [employer] in [place]  [place]  [facility] [phone] or [phone]  3. Follow-up in 6 months, call for any changes   Seizure Precautions: 1. If medication has been prescribed for you to prevent seizures, take it exactly as directed.  Do not stop taking the medicine without talking to your doctor first, even if you have not had a seizure in a long time.   2. Avoid activities in which a seizure would cause danger to yourself or to others.  Don't operate dangerous machinery, swim alone, or climb in high or dangerous places, such as on ladders, roofs, or girders.  Do not drive unless your doctor says you may.  3. If you have any warning that you may have a seizure, lay down in a safe place where you can't hurt yourself.    4.  No driving for 6 months from last seizure, as per [facility].   Please refer to the following link on the [place] website for more information: [email or web address]   5.  Maintain good sleep hygiene. Avoid alcohol.  6.  Contact your doctor if you have any problems that may be related to the medicine you are taking.  7.  Call 911 and bring the patient back to the ED if:        A.  The seizure lasts longer than 5 minutes.       B.  The patient doesn't awaken shortly after the seizure  C.  The patient has new problems such as difficulty seeing, speaking or moving  D.  The patient was injured during the seizure  E.  The patient has a temperature over 102 F (39C)  F.  The patient vomited and now is having trouble breathing

## 2022-02-25 ENCOUNTER — Emergency Department (HOSPITAL_COMMUNITY): Payer: Medicare Other

## 2022-02-25 ENCOUNTER — Observation Stay (HOSPITAL_COMMUNITY)
Admission: EM | Admit: 2022-02-25 | Discharge: 2022-03-01 | Disposition: A | Discharge status: Home / Self Care | Payer: Medicare Other | Attending: Internal Medicine | Admitting: Internal Medicine

## 2022-02-25 ENCOUNTER — Observation Stay (HOSPITAL_BASED_OUTPATIENT_CLINIC_OR_DEPARTMENT_OTHER): Payer: Medicare Other

## 2022-02-25 DIAGNOSIS — Z7901 Long term (current) use of anticoagulants: Secondary | ICD-10-CM | POA: Diagnosis not present

## 2022-02-25 DIAGNOSIS — J1282 Pneumonia due to coronavirus disease 2019: Secondary | ICD-10-CM

## 2022-02-25 DIAGNOSIS — I110 Hypertensive heart disease with heart failure: Secondary | ICD-10-CM | POA: Diagnosis not present

## 2022-02-25 DIAGNOSIS — R262 Difficulty in walking, not elsewhere classified: Secondary | ICD-10-CM | POA: Insufficient documentation

## 2022-02-25 DIAGNOSIS — Y92000 Kitchen of unspecified non-institutional (private) residence as  the place of occurrence of the external cause: Secondary | ICD-10-CM | POA: Diagnosis not present

## 2022-02-25 DIAGNOSIS — E119 Type 2 diabetes mellitus without complications: Secondary | ICD-10-CM | POA: Insufficient documentation

## 2022-02-25 DIAGNOSIS — I480 Paroxysmal atrial fibrillation: Secondary | ICD-10-CM | POA: Insufficient documentation

## 2022-02-25 DIAGNOSIS — I361 Nonrheumatic tricuspid (valve) insufficiency: Secondary | ICD-10-CM

## 2022-02-25 DIAGNOSIS — R2681 Unsteadiness on feet: Secondary | ICD-10-CM | POA: Diagnosis not present

## 2022-02-25 DIAGNOSIS — U071 COVID-19: Secondary | ICD-10-CM | POA: Diagnosis not present

## 2022-02-25 DIAGNOSIS — R531 Weakness: Secondary | ICD-10-CM | POA: Diagnosis present

## 2022-02-25 DIAGNOSIS — I5023 Acute on chronic systolic (congestive) heart failure: Secondary | ICD-10-CM | POA: Diagnosis not present

## 2022-02-25 DIAGNOSIS — Z66 Do not resuscitate: Secondary | ICD-10-CM | POA: Insufficient documentation

## 2022-02-25 DIAGNOSIS — W010XXA Fall on same level from slipping, tripping and stumbling without subsequent striking against object, initial encounter: Secondary | ICD-10-CM | POA: Insufficient documentation

## 2022-02-25 DIAGNOSIS — W19XXXA Unspecified fall, initial encounter: Secondary | ICD-10-CM

## 2022-02-25 DIAGNOSIS — Z85038 Personal history of other malignant neoplasm of large intestine: Secondary | ICD-10-CM | POA: Insufficient documentation

## 2022-02-25 DIAGNOSIS — M6282 Rhabdomyolysis: Secondary | ICD-10-CM

## 2022-02-25 DIAGNOSIS — Z8673 Personal history of transient ischemic attack (TIA), and cerebral infarction without residual deficits: Secondary | ICD-10-CM | POA: Insufficient documentation

## 2022-02-25 DIAGNOSIS — I509 Heart failure, unspecified: Secondary | ICD-10-CM

## 2022-02-25 DIAGNOSIS — Z79899 Other long term (current) drug therapy: Secondary | ICD-10-CM | POA: Insufficient documentation

## 2022-02-25 DIAGNOSIS — M6281 Muscle weakness (generalized): Secondary | ICD-10-CM | POA: Diagnosis not present

## 2022-02-25 DIAGNOSIS — J189 Pneumonia, unspecified organism: Secondary | ICD-10-CM

## 2022-02-25 LAB — CBC WITH DIFFERENTIAL/PLATELET
Abs Immature Granulocytes: 0.03 10*3/uL (ref 0.00–0.07)
Basophils Absolute: 0 10*3/uL (ref 0.0–0.1)
Basophils Relative: 0 %
Eosinophils Absolute: 0.2 10*3/uL (ref 0.0–0.5)
Eosinophils Relative: 3 %
HCT: 45.3 % (ref 39.0–52.0)
Hemoglobin: 14.4 g/dL (ref 13.0–17.0)
Immature Granulocytes: 0 %
Lymphocytes Relative: 8 %
Lymphs Abs: 0.7 10*3/uL (ref 0.7–4.0)
MCH: 30.9 pg (ref 26.0–34.0)
MCHC: 31.8 g/dL (ref 30.0–36.0)
MCV: 97.2 fL (ref 80.0–100.0)
Monocytes Absolute: 0.8 10*3/uL (ref 0.1–1.0)
Monocytes Relative: 9 %
Neutro Abs: 7.3 10*3/uL (ref 1.7–7.7)
Neutrophils Relative %: 80 %
Platelets: 113 10*3/uL — ABNORMAL LOW (ref 150–400)
RBC: 4.66 MIL/uL (ref 4.22–5.81)
RDW: 13.1 % (ref 11.5–15.5)
WBC: 9.1 10*3/uL (ref 4.0–10.5)
nRBC: 0 % (ref 0.0–0.2)

## 2022-02-25 LAB — COMPREHENSIVE METABOLIC PANEL
ALT: 23 U/L (ref 0–44)
AST: 64 U/L — ABNORMAL HIGH (ref 15–41)
Albumin: 2.9 g/dL — ABNORMAL LOW (ref 3.5–5.0)
Alkaline Phosphatase: 55 U/L (ref 38–126)
Anion gap: 8 (ref 5–15)
BUN: 21 mg/dL (ref 8–23)
CO2: 25 mmol/L (ref 22–32)
Calcium: 8.9 mg/dL (ref 8.9–10.3)
Chloride: 109 mmol/L (ref 98–111)
Creatinine, Ser: 1.3 mg/dL — ABNORMAL HIGH (ref 0.61–1.24)
GFR, Estimated: 55 mL/min — ABNORMAL LOW (ref >60)
Glucose, Bld: 106 mg/dL — ABNORMAL HIGH (ref 70–99)
Potassium: 3.7 mmol/L (ref 3.5–5.1)
Sodium: 142 mmol/L (ref 135–145)
Total Bilirubin: 1.7 mg/dL — ABNORMAL HIGH (ref 0.3–1.2)
Total Protein: 6.1 g/dL — ABNORMAL LOW (ref 6.5–8.1)

## 2022-02-25 LAB — RESP PANEL BY RT-PCR (RSV, FLU A&B, COVID)  RVPGX2
Influenza A by PCR: NEGATIVE
Influenza B by PCR: NEGATIVE
Resp Syncytial Virus by PCR: NEGATIVE
SARS Coronavirus 2 by RT PCR: POSITIVE — AB

## 2022-02-25 LAB — URINALYSIS, ROUTINE W REFLEX MICROSCOPIC
Bilirubin Urine: NEGATIVE
Glucose, UA: NEGATIVE mg/dL
Ketones, ur: 5 mg/dL — AB
Leukocytes,Ua: NEGATIVE
Nitrite: NEGATIVE
Protein, ur: 30 mg/dL — AB
Specific Gravity, Urine: >1.046 — ABNORMAL HIGH (ref 1.005–1.030)
pH: 5 (ref 5.0–8.0)

## 2022-02-25 LAB — T4, FREE: Free T4: 0.84 ng/dL (ref 0.61–1.12)

## 2022-02-25 LAB — ECHOCARDIOGRAM COMPLETE: S' Lateral: 3.9 cm

## 2022-02-25 LAB — PROCALCITONIN: Procalcitonin: <0.1 ng/mL

## 2022-02-25 LAB — FERRITIN: Ferritin: 164 ng/mL (ref 24–336)

## 2022-02-25 LAB — CK: Total CK: 1119 U/L — ABNORMAL HIGH (ref 49–397)

## 2022-02-25 LAB — MAGNESIUM: Magnesium: 1.8 mg/dL (ref 1.7–2.4)

## 2022-02-25 LAB — LACTIC ACID, PLASMA
Lactic Acid, Venous: 2.2 mmol/L (ref 0.5–1.9)
Lactic Acid, Venous: 1.3 mmol/L (ref 0.5–1.9)

## 2022-02-25 LAB — TROPONIN I (HIGH SENSITIVITY)
Troponin I (High Sensitivity): 143 ng/L (ref <18)
Troponin I (High Sensitivity): 77 ng/L — ABNORMAL HIGH (ref <18)

## 2022-02-25 LAB — TSH: TSH: 1.754 u[IU]/mL (ref 0.350–4.500)

## 2022-02-25 LAB — C-REACTIVE PROTEIN: CRP: 12.9 mg/dL — ABNORMAL HIGH (ref <1.0)

## 2022-02-25 LAB — BRAIN NATRIURETIC PEPTIDE: B Natriuretic Peptide: 525.2 pg/mL — ABNORMAL HIGH (ref 0.0–100.0)

## 2022-02-25 MED ORDER — GUAIFENESIN-DM 100-10 MG/5ML PO SYRP: 10.0000 mL | ORAL_SOLUTION | ORAL | Status: DC | PRN

## 2022-02-25 MED ORDER — SODIUM CHLORIDE 0.9 % IV SOLN: 100.0000 mg | Freq: Every day | INTRAVENOUS | Status: DC

## 2022-02-25 MED ORDER — ALBUTEROL SULFATE HFA 108 (90 BASE) MCG/ACT IN AERS
2.0000 | INHALATION_SPRAY | Freq: Four times a day (QID) | RESPIRATORY_TRACT | Status: DC
  Administered 2022-02-25 – 2022-02-28 (×8): 2 via RESPIRATORY_TRACT
  Filled 2022-02-25 (×3): qty 6.7

## 2022-02-25 MED ORDER — SODIUM CHLORIDE 0.9 % IV SOLN
200.0000 mg | Freq: Once | INTRAVENOUS | Status: DC
  Filled 2022-02-25: qty 40

## 2022-02-25 MED ORDER — ROSUVASTATIN CALCIUM 20 MG PO TABS
40.0000 mg | ORAL_TABLET | Freq: Every day | ORAL | Status: DC
  Administered 2022-02-25 – 2022-03-01 (×5): 40 mg via ORAL
  Filled 2022-02-25 (×5): qty 2

## 2022-02-25 MED ORDER — SODIUM CHLORIDE 0.9 % IV SOLN
1.0000 g | Freq: Once | INTRAVENOUS | Status: AC
  Administered 2022-02-25: 1 g via INTRAVENOUS
  Filled 2022-02-25: qty 10

## 2022-02-25 MED ORDER — APIXABAN 2.5 MG PO TABS
2.5000 mg | ORAL_TABLET | Freq: Two times a day (BID) | ORAL | Status: DC
  Administered 2022-02-25 – 2022-03-01 (×8): 2.5 mg via ORAL
  Filled 2022-02-25 (×8): qty 1

## 2022-02-25 MED ORDER — IOHEXOL 350 MG/ML SOLN
75.0000 mL | Freq: Once | INTRAVENOUS | Status: AC | PRN
  Administered 2022-02-25: 75 mL via INTRAVENOUS

## 2022-02-25 MED ORDER — SENNOSIDES-DOCUSATE SODIUM 8.6-50 MG PO TABS: 1.0000 | ORAL_TABLET | Freq: Every evening | ORAL | Status: DC | PRN

## 2022-02-25 MED ORDER — LEVETIRACETAM 750 MG PO TABS
750.0000 mg | ORAL_TABLET | Freq: Two times a day (BID) | ORAL | Status: DC
  Administered 2022-02-25 – 2022-03-01 (×8): 750 mg via ORAL
  Filled 2022-02-25 (×9): qty 1

## 2022-02-25 MED ORDER — ACETAMINOPHEN 650 MG RE SUPP: 650.0000 mg | Freq: Four times a day (QID) | RECTAL | Status: DC | PRN

## 2022-02-25 MED ORDER — ENOXAPARIN SODIUM 40 MG/0.4ML IJ SOSY: 40.0000 mg | PREFILLED_SYRINGE | INTRAMUSCULAR | Status: DC

## 2022-02-25 MED ORDER — LACTATED RINGERS IV BOLUS
500.0000 mL | Freq: Once | INTRAVENOUS | Status: AC
  Administered 2022-02-25: 500 mL via INTRAVENOUS

## 2022-02-25 MED ORDER — FUROSEMIDE 10 MG/ML IJ SOLN
20.0000 mg | Freq: Once | INTRAMUSCULAR | Status: AC
  Administered 2022-02-25: 20 mg via INTRAVENOUS
  Filled 2022-02-25: qty 2

## 2022-02-25 MED ORDER — SODIUM CHLORIDE 0.9 % IV SOLN
500.0000 mg | Freq: Once | INTRAVENOUS | Status: AC
  Administered 2022-02-25: 500 mg via INTRAVENOUS
  Filled 2022-02-25: qty 5

## 2022-02-25 MED ORDER — ACETAMINOPHEN 325 MG PO TABS
650.0000 mg | ORAL_TABLET | Freq: Four times a day (QID) | ORAL | Status: DC | PRN
  Administered 2022-02-25: 650 mg via ORAL
  Filled 2022-02-25: qty 2

## 2022-02-25 NOTE — H&P (Cosign Needed Addendum)
Date: 02/25/2022               Patient Name:  Edwin Wall MRN: [id number]  DOB: [birth year] Age / Sex: 81 y.o., male   PCP: [name], MD         Medical Service: Internal Medicine Teaching Service         Attending Physician: : Dr. Dareen Piano    First Contact: Edwin Juniper, MD      Pager: Edwin Wall [phone]      Second Contact: Edwin Duty, DO      Pager: RA [phone]           After Hours (After 5p/  First Contact Pager: [phone]  weekends / holidays): Second Contact Pager: [phone]   SUBJECTIVE   Chief Complaint: Weakness  History of Present Illness: Edwin Wall is an 81yo M with a PHMx significant for prior CVA, HFrEF, Afib on Eliquis, HLD, Seizures who presented to the ED for evaluation after a mechanical fall, weakness, and inability to be mobilized by his family members  He was last seen in his usual state of health of Thursday night. Per patient, he was taking out the garbage on Friday morning before the rest of family got up when he tripped in the kitchen, fell to the ground, and could not get up.  He did not lose consciousness and did not call out for help. He did not notice bleeding but had bruises in his L ear and both elbows. He just waited for his family and 15 min later his wife found him.   Patient mentioned his wife has been sick for the past 7 days with fevers, sore throat, and cough. His adult son with OCD and fear of strangers was also sick. They were unable to lift patient ans her was leaned against a chair for the past 3 days.  He has been eating and drinking but states it has been less than normal. He has also felt somewhat weak and with a mild cough for the past 3 days. He denies chest pain, SOB, rhinorrhea, sore throat. No diarrhea, no urinary symptoms. He feels hungry; patient has not eaten much in the past two days  Patient also remembers taking some of his medications but did not miss Eliquis because is afraid of  stroke. Has not taken Furosemide in the past week. Endorses swelling in his lower legs. Normally does not weigh himself  Spoke with wife who corroborated the story above. Mentioned daughter who lives in [place] came home for [place] and also attempted to help family move patient but failed. Ambulance was called yesterday as they were worried for patient. [name] also mentioned patient has been somewhat weak, with mild cough, and not eating full meals. She was not able to care for him as she was sick herself. Patient missed 2-3 days worth of medications in the past week.    ED Course: Alert and oriented, CBC without leukocytosis or anemia. CMP with Cr 1.30. CK elevated at 1119, U/A pending. Lactic acid 2.2. Troponin elevated 143. BNP evelated at 525. COVID positive. EKG without atrial fibrillation without ischemic changes. CT head and neck, and XR r forearm without abnormalities. CXR RUL opacity later evaluated with CT A chest with patchy consolidation 2/2 pulmonary edema vs infection, small bilateral pleural effusions, and reactive mediastinal LAD. Patient received 500 mL fluid resuscitation. Ceftriaxone Pending LR, but given ongoing need for treatment and evaluation, he was admitted to IMTS for further management.  Meds:  Colestipol Eliquis 2.5 mg bid Zetia 10 mg daily Keppra 750 mg BID Crestor 40 mg dialy Kclor 51mq 1 daily Lasix 20 mg prn  No outpatient medications have been marked as taking for the 02/25/22 encounter ([facility]    Past Medical History  Past Surgical History:  Procedure Laterality Date   CARDIOVERSION [name] 04/23/2021   Procedure: CARDIOVERSION;  Surgeon: SDonato Heinz MD;  [facility];  Service: Cardiovascular;  Laterality: [name];   COLECTOMY  12/17/2016   lap; partial sigmoid colectomy/notes 12/17/2016   COLONOSCOPY W/ BIOPSIES AND POLYPECTOMY  11/11/2016   LAPAROSCOPIC SIGMOID COLECTOMY [name] 12/17/2016   Procedure: LAPAROSCOPIC  SIGMOID COLECTOMY;  Surgeon: BStark Klein MD;  [place] [facility] [place]  Service: General;  Laterality: [name];   LEFT HEART CATH AND CORONARY ANGIOGRAPHY [name] 03/11/2021   Procedure: LEFT HEART CATH AND CORONARY ANGIOGRAPHY;  Surgeon: ENelva Bush MD;  [facility];  Service: Cardiovascular;  Laterality: [name];   NASAL SEPTUM SURGERY     TONSILLECTOMY      Social:  [place]upation: retired pPersonal assistantSupport:  Level of Function: PCP: Dr BMelford AaseSubstances: no tobacco, alcohol  Family History: No diabetes, HTN  Allergies: Allergies as of 02/25/2022   (No Known Allergies)    Review of Systems: A complete ROS was negative except as per HPI.   OBJECTIVE:   Physical Exam: Blood pressure (!) 135/90, pulse 93, temperature 99 F (37.2 C), temperature source Oral, resp. rate (!) 24, SpO2 100 %.  Constitutional: chronically ill-appearing man sitting in bed, in no acute distress HENT: normocephalic atraumatic, mucous membranes moist Cardiovascular: regular rate and rhythm, no m/r/g,  JVD to angle of mandible Pulmonary/Chest: normal work of breathing on room air, lungs clear to auscultation bilaterally. Left lower crackles  Abdominal: soft, non-tender, non-distended. No fluid wave  Neurological: alert & oriented to self, place, situation, not year (says 1985). No focal neurologic deficit MSK: no gross abnormalities. Moving all extremities 2+ bilateral lower extremity pitting edema up to mid calf Skin: warm and dry Psych: Normal mood and affect  Labs: CBC    Component Value Date/Time   WBC 9.1 02/25/2022 0937   RBC 4.66 02/25/2022 0937   HGB 14.4 02/25/2022 0937   HGB 13.3 04/18/2021 1508   HCT 45.3 02/25/2022 0937   HCT 40.6 04/18/2021 1508   PLT 113 (L) 02/25/2022 0937   PLT 219 04/18/2021 1508   MCV 97.2 02/25/2022 0937   MCV 94 04/18/2021 1508   MCH 30.9 02/25/2022 0937   MCHC 31.8 02/25/2022 0937   RDW 13.1 02/25/2022 0937   RDW  13.3 04/18/2021 1508   LYMPHSABS 0.7 02/25/2022 0937   MONOABS 0.8 02/25/2022 0937   EOSABS 0.2 02/25/2022 0937   BASOSABS 0.0 02/25/2022 0937     CMP     Component Value Date/Time   NA 142 02/25/2022 0937   NA 145 (H) 04/18/2021 1508   K 3.7 02/25/2022 0937   CL 109 02/25/2022 0937   CO2 25 02/25/2022 0937   GLUCOSE 106 (H) 02/25/2022 0937   BUN 21 02/25/2022 0937   BUN 24 04/18/2021 1508   CREATININE 1.30 (H) 02/25/2022 0937   CREATININE 1.49 (H) 01/15/2011 1057   CALCIUM 8.9 02/25/2022 0937   PROT 6.1 (L) 02/25/2022 0937   PROT 5.6 (L) 04/18/2021 1508   ALBUMIN 2.9 (L) 02/25/2022 0937   ALBUMIN 3.4 (L) 04/18/2021 1508   AST 64 (H) 02/25/2022 02774  ALT 23 02/25/2022 0937   ALKPHOS 55 02/25/2022 0937   BILITOT 1.7 (H) 02/25/2022 0937   BILITOT 0.7 04/18/2021 1508   GFRNONAA 55 (L) 02/25/2022 0937   GFRAA 52 (L) 12/20/2016 0438    Imaging: CT Angio Chest PE W and/or Wo Contrast  Result Date: 02/25/2022 CLINICAL DATA:  Pulmonary embolus EXAM: CT ANGIOGRAPHY CHEST WITH CONTRAST TECHNIQUE: Multidetector CT imaging of the chest was performed using the standard protocol during bolus administration of intravenous contrast. Multiplanar CT image reconstructions and MIPs were obtained to evaluate the vascular anatomy. RADIATION DOSE REDUCTION: This exam was performed according to the departmental dose-optimization program which includes automated exposure control, adjustment of the mA and/or kV according to patient size and/or use of iterative reconstruction technique. CONTRAST:  6m OMNIPAQUE IOHEXOL 350 MG/ML SOLN COMPARISON:  None Available. FINDINGS: Cardiovascular: No evidence of pulmonary embolus. Normal heart size. No pericardial effusion. Normal caliber thoracic aorta with mild calcified plaque. Mediastinum/Nodes: Enlarged mediastinal lymph nodes are likely reactive, reference subcarinal lymph node measuring 1.7 cm in short axis on series 8, image 71. Esophagus and thyroid are  unremarkable. Lungs/Pleura: Central airways are patent. Upper lobe predominant patchy consolidations of the right lung. Small right-greater-than-left pleural effusions with associated atelectasis. Upper Abdomen: No acute abnormality. Musculoskeletal: No chest wall abnormality. No acute or significant osseous findings. Review of the MIP images confirms the above findings. IMPRESSION: 1. No evidence of pulmonary embolus. 2. Upper lobe predominant patchy consolidations of the right lung, likely due to asymmetric pulmonary edema, possibly related to mitral valve dysfunction. Infection is an additional consideration. 3. Small right-greater-than-left pleural effusions with associated atelectasis. 4. Enlarged mediastinal lymph nodes, favored to be reactive, although given size, follow-up CT is recommended in 3 months to ensure resolution. 5. Aortic Atherosclerosis (ICD10-I70.0). Electronically Signed   By: LYetta GlassmanM.D.   On: 02/25/2022 13:10   CT Head Wo Contrast  Result Date: 02/25/2022 CLINICAL DATA:  Blunt poly trauma EXAM: CT HEAD WITHOUT CONTRAST CT CERVICAL SPINE WITHOUT CONTRAST TECHNIQUE: Multidetector CT imaging of the head and cervical spine was performed following the standard protocol without intravenous contrast. Multiplanar CT image reconstructions of the cervical spine were also generated. RADIATION DOSE REDUCTION: This exam was performed according to the departmental dose-optimization program which includes automated exposure control, adjustment of the mA and/or kV according to patient size and/or use of iterative reconstruction technique. COMPARISON:  07/27/2021 FINDINGS: CT HEAD FINDINGS Brain: No evidence of acute infarction, hemorrhage, extra-axial collection, ventriculomegaly, or mass effect. 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 Generalized cerebral atrophy. Periventricular white matter low attenuation likely secondary to microangiopathy. Vascular:  Cerebrovascular atherosclerotic calcifications are noted. No hyperdense vessels. Skull: Negative for fracture or focal lesion. Sinuses/Orbits: Visualized portions of the orbits are unremarkable. Visualized portions of the paranasal sinuses are unremarkable. Visualized portions of the mastoid air cells are unremarkable. Other: None. CT CERVICAL SPINE FINDINGS Alignment: Normal. Skull base and vertebrae: No acute fracture. No primary bone lesion or focal pathologic process. Soft tissues and spinal canal: No prevertebral fluid or swelling. No visible canal hematoma. Disc levels: Ankylosis across the C3-4 disc space. Degenerative disease with disc height loss at C4-5, C5-6, C6-7 and T1-2. At C2-3 there is moderate right facet arthropathy, moderate left facet arthropathy. No foraminal stenosis at C2-3. At C3-4 there is a broad-based disc osteophyte complex, bilateral uncovertebral degenerative changes, moderate right foraminal stenosis, and no left foraminal stenosis. At C4-5 there is a broad-based disc osteophyte complex, bilateral  uncovertebral degenerative changes and mild right foraminal stenosis. Mild left facet arthropathy at C4-5. At C5-6 there is a broad-based disc osteophyte complex with bilateral uncovertebral degenerative changes, severe right foraminal stenosis, and mild left foraminal stenosis. At C6-7 there is a broad-based disc osteophyte complex impressing upon the thecal sac, bilateral uncovertebral degenerative changes, severe right foraminal stenosis, and moderate-severe left foraminal stenosis. At C7-T1 there is severe right and mild left facet arthropathy. At T1-2 there is a broad-based disc osteophyte complex, and moderate bilateral foraminal stenosis. Partial ankylosis across the T1-2 disc space. Upper chest: Patchy right apical airspace disease partially visualized concerning for pneumonia. Other: No fluid collection or hematoma. IMPRESSION: 1. No acute intracranial pathology. 2. No acute osseous  injury of the cervical spine. 3. Cervical spine spondylosis as described above. 4. Patchy right apical airspace disease partially visualized concerning for pneumonia. Electronically Signed   By: Kathreen Devoid M.D.   On: 02/25/2022 09:42   CT Cervical Spine Wo Contrast  Result Date: 02/25/2022 CLINICAL DATA:  Blunt poly trauma EXAM: CT HEAD WITHOUT CONTRAST CT CERVICAL SPINE WITHOUT CONTRAST TECHNIQUE: Multidetector CT imaging of the head and cervical spine was performed following the standard protocol without intravenous contrast. Multiplanar CT image reconstructions of the cervical spine were also generated. RADIATION DOSE REDUCTION: This exam was performed according to the departmental dose-optimization program which includes automated exposure control, adjustment of the mA and/or kV according to patient size and/or use of iterative reconstruction technique. COMPARISON:  07/27/2021 FINDINGS: CT HEAD FINDINGS Brain: No evidence of acute infarction, hemorrhage, extra-axial collection, ventriculomegaly, or mass effect. 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 Generalized cerebral atrophy. Periventricular white matter low attenuation likely secondary to microangiopathy. Vascular: Cerebrovascular atherosclerotic calcifications are noted. No hyperdense vessels. Skull: Negative for fracture or focal lesion. Sinuses/Orbits: Visualized portions of the orbits are unremarkable. Visualized portions of the paranasal sinuses are unremarkable. Visualized portions of the mastoid air cells are unremarkable. Other: None. CT CERVICAL SPINE FINDINGS Alignment: Normal. Skull base and vertebrae: No acute fracture. No primary bone lesion or focal pathologic process. Soft tissues and spinal canal: No prevertebral fluid or swelling. No visible canal hematoma. Disc levels: Ankylosis across the C3-4 disc space. Degenerative disease with disc height loss at C4-5, C5-6, C6-7 and T1-2. At C2-3 there is  moderate right facet arthropathy, moderate left facet arthropathy. No foraminal stenosis at C2-3. At C3-4 there is a broad-based disc osteophyte complex, bilateral uncovertebral degenerative changes, moderate right foraminal stenosis, and no left foraminal stenosis. At C4-5 there is a broad-based disc osteophyte complex, bilateral uncovertebral degenerative changes and mild right foraminal stenosis. Mild left facet arthropathy at C4-5. At C5-6 there is a broad-based disc osteophyte complex with bilateral uncovertebral degenerative changes, severe right foraminal stenosis, and mild left foraminal stenosis. At C6-7 there is a broad-based disc osteophyte complex impressing upon the thecal sac, bilateral uncovertebral degenerative changes, severe right foraminal stenosis, and moderate-severe left foraminal stenosis. At C7-T1 there is severe right and mild left facet arthropathy. At T1-2 there is a broad-based disc osteophyte complex, and moderate bilateral foraminal stenosis. Partial ankylosis across the T1-2 disc space. Upper chest: Patchy right apical airspace disease partially visualized concerning for pneumonia. Other: No fluid collection or hematoma. IMPRESSION: 1. No acute intracranial pathology. 2. No acute osseous injury of the cervical spine. 3. Cervical spine spondylosis as described above. 4. Patchy right apical airspace disease partially visualized concerning for pneumonia. Electronically Signed   By: [place] Service.D.  On: 02/25/2022 09:42   DG Forearm Right  Result Date: 02/25/2022 CLINICAL DATA:  Fall EXAM: RIGHT FOREARM - 2 VIEW COMPARISON:  None Available. FINDINGS: There is no evidence of fracture or other focal bone lesions. Soft tissues are unremarkable. IMPRESSION: Negative. Electronically Signed   By: Yetta Glassman M.D.   On: 02/25/2022 09:09   DG Chest Portable 1 View  Result Date: 02/25/2022 CLINICAL DATA:  Cough, weakness. EXAM: PORTABLE CHEST 1 VIEW COMPARISON:  Jul 27, 2021.  FINDINGS: Stable cardiomegaly. Left lung is clear. Increased right upper lobe opacity is noted which may represent pneumonia, although nodular opacity is noted in right upper lobe. Bony thorax is unremarkable. IMPRESSION: Increased right upper lobe opacity is noted which may represent pneumonia, although nodular opacity is noted in right upper lobe concerning for possible mass or neoplasm. CT scan is recommended for further evaluation. Electronically Signed   By: Marijo Conception M.D.   On: 02/25/2022 09:01      EKG: personally reviewed my interpretation is atrial fibrillation without signs of ischemia. Prior EKG with similar findings  ASSESSMENT & PLAN:   Assessment & Plan by Problem: Active Problems:   * No active hospital problems. *   Nasim Habeeb Shadoan is a 81 y.o. person living with a history of Mr. Kano Heckmann is an 81yo M with a PHMx significant for prior CVA, HFrEF, Afib on Eliquis, HLD, Seizures  COVID PNA Patient with history of family members with URI and severe cough presenting with weakness and new cough for past 3 days. Afebrile and no leukocytosis but increased lactic acid on presentation. COVID positive on admission and CXR and CTA with findings of RUL infiltrate suggestive of PNA vs pulmonary edema. Thus fair, patient is maintaining oxygenation at RA. He has clinical risk factors that increase his risk factors for progression of disease, thus will start treatment with Remdesivir. S/p empiric antibiotics in the ED; this is not required in COVID 19 PNA patients. Will continue to monitor and re-assess should patient become febrile or worsen with current therapy.   If he were to require supplemental oxygen, will consider adding steroids to his current management per COVID 19 PNA protocol. Patient received a one time dose of antibiotics for empiric CAP treatment.  -Started on Remdesivir 12/26 -f/u lactic acid, CRP -f/p sputum cultures when obtained -Isolation precautions in  place -Continue to monitor vitals  HFrEF Acute on chronic heart failure exacerbation TTE 2022 with EF 45-50%. Patient with hx of missing home furosemide for a week, volume overloaded on physical exam, and CT chest with evidence of small bilateral pleural effusions and RUL infiltrate concerning for asymmetric pulmonary edema vs PNA. Elevated troponins on presentation without signs of ischemia on EKG. Awaiting second troponin; do not suspect ACS but will follow. Patient received small LR bolus in the setting of decreased PO intake and elevated lactic acid. Will re-evaluate fluid status in the AM and consider Lasix dosing after lactic cid normalization.  -TTE pending -Consider Lasix dosing pending repeat Lactic acid results  Permanent Afib on Eliquis Prior CVA Rate controlled without medications. No evidence of bleeding on exam or radiographic evaluation -Continue Eliquis 2.5 mg BID  Seizures Followed by neurology.  No recent seizure episodes since being started on Keppra 750 mg daily -Continue Keppra 750 mg  Left MCA CVA HLD  - Continue Crestor 20 mg -Continue Zetia 10 mg  DM Not on medications. Last A1c 2022 6.2. -Continue monitoring blood glucose  Diet: Heart Healthy  VTE: Eliquis Code: DNR  Prior to Admission Living Arrangement: Home, living wife Anticipated Discharge Location: pending evaluation by PT/OT Barriers to Discharge: clinical improvement  Dispo: Admit patient to Observation with expected length of stay less than 2 midnights.  Signed:   Romana Juniper, MD Internal Medicine Resident PGY-1 02/25/2022, 1:36 PM

## 2022-02-25 NOTE — ED Notes (Signed)
Pt having multiple watery bowel movements; requiring bath and full linen change. Pt denies having watery stool prior to coming to the hospital.

## 2022-02-25 NOTE — ED Provider Notes (Signed)
[facility] EMERGENCY DEPARTMENT Provider Note   CSN: [id number] Arrival date & time: 02/25/22  0815     History  Chief Complaint  Edwin Wall presents with   Edwin Wall    Edwin Wall is a 81 y.o. Wall.  81 year old Wall with past medical history significant for HFrEF, A-fib on Eliquis who presents today for evaluation following a fall, and being found on the floor.  According the Edwin Wall Edwin Wall has been feeling weak and to prevent himself from falling and injuring himself Edwin Wall lowered himself to the ground 3 days ago.  Edwin Wall has remained on the floor since then.  Edwin Wall states Edwin Wall family has been unable to get him up.  They waited until today to call EMS.  Edwin Wall denies any chest pain, shortness of breath, palpitations, lightheadedness.  Edwin Wall is unable to tell me how long Edwin Wall has been feeling weak for prior to falling to the ground.  Does endorse decreased p.o. intake over the past couple days.  Edwin Wall states Edwin Wall has been using a water bottle to urinating.  Has not had a bowel movement.  Edwin Wall denies any loss of consciousness.  Has a bruise to Edwin Wall right forearm.  Edwin Wall last Eliquis dose was last night.  Attempted to call family without success.  Will try again later. I was able to speak to Edwin Wall.  She states that she has been sick with URI symptoms for the past week.  Her husband has been doing relatively well until the morning Edwin Wall had a fall.  She states between her, her son, daughter there were unable to get him off the floor.  Edwin Wall was lying on blankets, and had a pillow.  Edwin Wall was moving around somewhat.  Decreased p.o. intake in the past couple days.  Cough, with complaints of weakness or other complaints prior to Friday when Edwin Wall had Edwin Wall fall.  This was an unwitnessed fall.  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  Following rehab Edwin Wall has had no deficits.  Edwin Wall moves around independently and is able to carry out all of Edwin Wall activities of daily living.  The history is provided by the  Edwin Wall and the EMS personnel. No language interpreter was used.       Home Medications Prior to Admission medications   Medication Sig Start Date End Date Taking? Authorizing Provider  apixaban (ELIQUIS) 2.5 MG TABS tablet Take 1 tablet (2.5 mg total) by mouth 2 (two) times daily. Edwin Wall taking differently: Take 2.5 mg by mouth 2 (two) times daily. Take 1/2 tab in AM and 1/2 at night 12/20/20   Sueanne Margarita, MD  colestipol (COLESTID) 1 g tablet Take 1 tablet (1 g total) by mouth 2 (two) times daily. Edwin Wall taking differently: Take 1 g by mouth daily. 08/01/20   Love, Ivan Anchors, PA-C  diclofenac Sodium (VOLTAREN) 1 % GEL Apply 4 g topically 4 (four) times daily. 04/15/21   Lajean Manes, MD  ezetimibe (ZETIA) 10 MG tablet Take 1 tablet (10 mg total) by mouth daily. 04/19/21   Sueanne Margarita, MD  furosemide (LASIX) 20 MG tablet Take 1 tablet (20 mg total) by mouth every other day. 04/19/21   Sueanne Margarita, MD  levETIRAcetam (KEPPRA) 750 MG tablet Take 1 tablet (750 mg total) by mouth 2 (two) times daily. 12/13/21 03/13/22  Cameron Sprang, MD  liver oil-zinc oxide (DESITIN) 40 % ointment Apply 1 application topically as needed for irritation.    [provider]  neomycin-bacitracin-polymyxin (NEOSPORIN) ointment Apply 1 application. topically as needed for wound care.    [provider]  Phenylephrine HCl (NEO-SYNEPHRINE NA) Place 1 spray into the nose daily as needed (congestion).    [provider]  potassium chloride (KLOR-CON M) 10 MEQ tablet Take 10 mEq by mouth daily.    [provider]  rosuvastatin (CRESTOR) 40 MG tablet Take 1 tablet (40 mg total) by mouth daily. 08/01/20   Bary Leriche, PA-C      Allergies    Edwin Wall has no known allergies.    Review of Systems   Review of Systems  Constitutional:  Negative for chills and fever.  Respiratory:  Negative for shortness of breath.   Cardiovascular:  Negative for chest pain.  Gastrointestinal:   Negative for abdominal pain.  Musculoskeletal:  Negative for back pain and neck pain.  Skin:  Positive for wound.  Neurological:  Positive for weakness. Negative for syncope and light-headedness.  All other systems reviewed and are negative.   Physical Exam Updated Vital Signs BP (!) 154/120 (BP Location: Left Arm)   Pulse (!) 105   Temp 99 F (37.2 C) (Oral)   Resp 15   SpO2 98%  Physical Exam Vitals and nursing note reviewed.  Constitutional:      General: Edwin Wall is not in acute distress.    Appearance: Normal appearance. Edwin Wall is not ill-appearing.  HENT:     Head: Normocephalic and atraumatic.     Nose: Nose normal.  Eyes:     General: No scleral icterus.    Extraocular Movements: Extraocular movements intact.     Conjunctiva/sclera: Conjunctivae normal.  Cardiovascular:     Rate and Rhythm: Normal rate and regular rhythm.     Pulses: Normal pulses.  Pulmonary:     Effort: Pulmonary effort is normal. No respiratory distress.     Breath sounds: Normal breath sounds. No wheezing or rales.  Abdominal:     General: There is no distension.     Tenderness: There is no abdominal tenderness.  Musculoskeletal:        General: Normal range of motion.     Cervical back: Normal range of motion.  Skin:    General: Skin is warm and dry.  Neurological:     General: No focal deficit present.     Mental Status: Edwin Wall is alert and oriented to person, place, and time. Mental status is at baseline.     ED Results / Procedures / Treatments   Labs (all labs ordered are listed, but only abnormal results are displayed) Labs Reviewed  RESP PANEL BY RT-PCR (RSV, FLU A&B, COVID)  RVPGX2  CBC WITH DIFFERENTIAL/PLATELET  COMPREHENSIVE METABOLIC PANEL  CK  URINALYSIS, ROUTINE W REFLEX MICROSCOPIC  BRAIN NATRIURETIC PEPTIDE  LACTIC ACID, PLASMA  LACTIC ACID, PLASMA  MAGNESIUM  TSH  T4, FREE  TROPONIN I (HIGH SENSITIVITY)    EKG EKG Interpretation  Date/Time:  Tuesday February 25 2022  08:32:13 EST Ventricular Rate:  94 PR Interval:    QRS Duration: 132 QT Interval:  387 QTC Calculation: 484 R Axis:   -80 Text Interpretation: Atrial fibrillation RBBB and LAFB Confirmed by Georgina Snell [phone]) on 02/25/2022 8:36:24 AM  Radiology No results found.  Procedures Procedures    Medications Ordered in ED Medications  lactated ringers bolus 500 mL (has no administration in time range)    ED Course/ Medical Decision Making/ A&P Clinical Course as of 02/25/22 1043  Tue Feb 25, 2022  1014 CT Cervical Spine Wo Contrast [AA]    Clinical Course User Index [AA] Evlyn Courier, PA-C                           Medical Decision Making Amount and/or Complexity of Data Reviewed Labs: ordered. Radiology: ordered. Decision-making details documented in ED Course.   Medical Decision Making / ED Course   This Edwin Wall presents to the ED for concern of fall, cough, weakness, this involves an extensive number of treatment options, and is a complaint that carries with it a high risk of complications and morbidity.  The differential diagnosis includes intracranial injury, rhabdomyolysis, pneumonia, ACS  MDM: 81 year old Wall with past medical history as above presents today for evaluation of fall while on Eliquis, weakness, and cough.  Edwin Wall has been on the floor for the past 3 days.  Family has been home with him and has been unable to get him up.  They finally realized Edwin Wall will not be able to help family and called 911.  Edwin Wall is alert and oriented.  Does not appear volume overloaded on exam.  Does have history of HFrEF with EF of about 45%.  Edwin Wall has had decreased p.o. intake the past 2 days.  Edwin Wall workup shows CBC without leukocytosis or anemia.  CMP shows creatinine 1.30 otherwise without acute findings.  CK of 1119.  Lactic acidosis at 2.2.  Troponinemia at 143.  COVID-positive. Troponin elevation likely demand from rhabdomyolysis, pneumonia, and COVID infection.  EKG without  acute ischemic changes.  Low suspicion for ACS.  Edwin Wall is without chest pain.  BNP elevated at 525.  Will judiciously volume resuscitate Edwin Wall.  Discussed Edwin Wall with internal medicine service for admission.   Additional history obtained: -Additional history obtained from Wall, EMS personnel -External records from outside source obtained and reviewed including: Chart review including previous notes, labs, imaging, consultation notes   Lab Tests: -I ordered, reviewed, and interpreted labs.   The pertinent results include:   Labs Reviewed  RESP PANEL BY RT-PCR (RSV, FLU A&B, COVID)  RVPGX2 - Abnormal; Notable for the following components:      Result Value   SARS Coronavirus 2 by RT PCR POSITIVE (*)    All other components within normal limits  CBC WITH DIFFERENTIAL/PLATELET - Abnormal; Notable for the following components:   Platelets 113 (*)    All other components within normal limits  COMPREHENSIVE METABOLIC PANEL - Abnormal; Notable for the following components:   Glucose, Bld 106 (*)    Creatinine, Ser 1.30 (*)    Total Protein 6.1 (*)    Albumin 2.9 (*)    AST 64 (*)    Total Bilirubin 1.7 (*)    GFR, Estimated 55 (*)    All other components within normal limits  CK - Abnormal; Notable for the following components:   Total CK 1,119 (*)    All other components within normal limits  BRAIN NATRIURETIC PEPTIDE - Abnormal; Notable for the following components:   B Natriuretic Peptide 525.2 (*)    All other components within normal limits  LACTIC ACID, PLASMA - Abnormal; Notable for the following components:   Lactic Acid, Venous 2.2 (*)    All other components within normal limits  TROPONIN I (HIGH SENSITIVITY) - Abnormal; Notable for the following components:   Troponin I (High Sensitivity) 143 (*)    All other components within normal limits  MAGNESIUM  TSH  T4,  FREE  URINALYSIS, ROUTINE W REFLEX MICROSCOPIC  LACTIC ACID, PLASMA  TROPONIN I (HIGH SENSITIVITY)       EKG  EKG Interpretation  Date/Time:  Tuesday February 25 2022 08:32:13 EST Ventricular Rate:  94 PR Interval:    QRS Duration: 132 QT Interval:  387 QTC Calculation: 484 R Axis:   -80 Text Interpretation: Atrial fibrillation RBBB and LAFB Confirmed by Georgina Snell [phone]) on 02/25/2022 8:36:24 AM         Imaging Studies ordered: I ordered imaging studies including CT head, CT cervical spine, right forearm x-ray, chest x-ray.  CT angio PE study ordered but not resulted at the time of admission I independently visualized and interpreted imaging. I agree with the radiologist interpretation   Medicines ordered and prescription drug management: Meds ordered this encounter  Medications   lactated ringers bolus 500 mL   cefTRIAXone (ROCEPHIN) 1 g in sodium chloride 0.9 % 100 mL IVPB    Order Specific Question:   Antibiotic Indication:    Answer:   CAP   azithromycin (ZITHROMAX) 500 mg in sodium chloride 0.9 % 250 mL IVPB   iohexol (OMNIPAQUE) 350 MG/ML injection 75 mL    -I have reviewed the patients home medicines and have made adjustments as needed   Reevaluation: After the interventions noted above, I reevaluated the Edwin Wall and found that they have :stayed the same  Co morbidities that complicate the Edwin Wall evaluation  Past Medical History:  Diagnosis Date   Anxiety    Arthritis    Bilateral leg edema    Cataract    Colon cancer ([place]) dx'd 11/2016   "stage 3"   Gout    Gout    Hemorrhoids    History of kidney stones    "passed it"   Hypertension    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[place])    EF 45-50% by echo 10/22   PAF (paroxysmal atrial fibrillation) ([place])    Pre-diabetes       Dispostion: Edwin Wall discussed with admitting team who will evaluate Edwin Wall for admission.  Final Clinical Impression(s) / ED Diagnoses Final diagnoses:  None    Rx / DC Orders ED Discharge Orders     None         Evlyn Courier, PA-C 02/25/22 1300     Elgie Congo, MD 02/26/22 1711

## 2022-02-25 NOTE — ED Notes (Signed)
[phone] wife would like a update

## 2022-02-25 NOTE — Progress Notes (Signed)
  Echocardiogram 2D Echocardiogram has been performed.  Edwin Wall 02/25/2022, 5:18 PM

## 2022-02-25 NOTE — ED Triage Notes (Signed)
Pt BIB EMS after a fall at home, on the ground for 3 days. On Eliquis. Having Afib with EMS with no known history. Visible bruising on right arm and small lac on left ear. Given 23m of NS with EMS.

## 2022-02-25 NOTE — ED Notes (Signed)
[place] at [facility] for echo .

## 2022-02-25 NOTE — ED Notes (Signed)
PA notified of critical lactic 2.2 and troponin 143

## 2022-02-26 ENCOUNTER — Other Ambulatory Visit: Payer: Self-pay

## 2022-02-26 ENCOUNTER — Encounter (HOSPITAL_COMMUNITY): Payer: Self-pay | Admitting: Internal Medicine

## 2022-02-26 DIAGNOSIS — U071 COVID-19: Secondary | ICD-10-CM | POA: Diagnosis not present

## 2022-02-26 DIAGNOSIS — J1282 Pneumonia due to coronavirus disease 2019: Secondary | ICD-10-CM | POA: Diagnosis not present

## 2022-02-26 LAB — CBC
HCT: 38.5 % — ABNORMAL LOW (ref 39.0–52.0)
Hemoglobin: 12.3 g/dL — ABNORMAL LOW (ref 13.0–17.0)
MCH: 30.6 pg (ref 26.0–34.0)
MCHC: 31.9 g/dL (ref 30.0–36.0)
MCV: 95.8 fL (ref 80.0–100.0)
Platelets: 118 10*3/uL — ABNORMAL LOW (ref 150–400)
RBC: 4.02 MIL/uL — ABNORMAL LOW (ref 4.22–5.81)
RDW: 13.2 % (ref 11.5–15.5)
WBC: 7.2 10*3/uL (ref 4.0–10.5)
nRBC: 0 % (ref 0.0–0.2)

## 2022-02-26 LAB — BASIC METABOLIC PANEL
Anion gap: 7 (ref 5–15)
BUN: 31 mg/dL — ABNORMAL HIGH (ref 8–23)
CO2: 24 mmol/L (ref 22–32)
Calcium: 8.4 mg/dL — ABNORMAL LOW (ref 8.9–10.3)
Chloride: 109 mmol/L (ref 98–111)
Creatinine, Ser: 1.47 mg/dL — ABNORMAL HIGH (ref 0.61–1.24)
GFR, Estimated: 48 mL/min — ABNORMAL LOW (ref >60)
Glucose, Bld: 91 mg/dL (ref 70–99)
Potassium: 3.7 mmol/L (ref 3.5–5.1)
Sodium: 140 mmol/L (ref 135–145)

## 2022-02-26 LAB — MAGNESIUM: Magnesium: 2 mg/dL (ref 1.7–2.4)

## 2022-02-26 MED ORDER — COLESTIPOL HCL 1 G PO TABS
1.0000 g | ORAL_TABLET | Freq: Every day | ORAL | Status: DC
  Administered 2022-02-26 – 2022-03-01 (×4): 1 g via ORAL
  Filled 2022-02-26 (×5): qty 1

## 2022-02-26 MED ORDER — EZETIMIBE 10 MG PO TABS
10.0000 mg | ORAL_TABLET | Freq: Every day | ORAL | Status: DC
  Administered 2022-02-26 – 2022-03-01 (×4): 10 mg via ORAL
  Filled 2022-02-26 (×4): qty 1

## 2022-02-26 MED ORDER — FUROSEMIDE 10 MG/ML IJ SOLN
20.0000 mg | Freq: Once | INTRAMUSCULAR | Status: AC
  Administered 2022-02-26: 20 mg via INTRAVENOUS
  Filled 2022-02-26: qty 2

## 2022-02-26 MED ORDER — PNEUMOCOCCAL 20-VAL CONJ VACC 0.5 ML IM SUSY
0.5000 mL | PREFILLED_SYRINGE | INTRAMUSCULAR | Status: DC
  Filled 2022-02-26: qty 0.5

## 2022-02-26 MED ORDER — INFLUENZA VAC A&B SA ADJ QUAD 0.5 ML IM PRSY
0.5000 mL | PREFILLED_SYRINGE | INTRAMUSCULAR | Status: DC
  Filled 2022-02-26: qty 0.5

## 2022-02-26 MED ORDER — POTASSIUM CHLORIDE CRYS ER 10 MEQ PO TBCR
10.0000 meq | EXTENDED_RELEASE_TABLET | Freq: Every day | ORAL | Status: DC
  Administered 2022-02-26 – 2022-03-01 (×4): 10 meq via ORAL
  Filled 2022-02-26 (×4): qty 1

## 2022-02-26 NOTE — Progress Notes (Signed)
Hospital day#0 Subjective:   Summary: [name] is an 81yo M with a PHMx significant for prior CVA, HFrEF, Afib on Eliquis, HLD, Seizures admitted for COVID PNA and acute on CHF  Overnight Events: None  Patient ate dinner this AM, large BM. No pain or discomfort today. Denies n/v, SOB, cough, chest pain. Admits to being intermittently confused since his stroke. Unsure if he feels stronger overall but motivated to [employer] with physical therapy today. Discussed potential need to be discharged to SNF with rehab prior to going home.   Objective:  Vital signs in last 24 hours: Vitals:   02/26/22 0630 02/26/22 0651 02/26/22 0652 02/26/22 0655  BP: 124/65     Pulse: 88 82 73 (!) 101  Resp: '17 17 16 16  '$ Temp:      TempSrc:      SpO2: 93% 97% 97%   Weight:      Height:       Supplemental O2: Room Air SpO2: 97 % Filed Weights   02/26/22 0344  Weight: 94 kg    Physical Exam:  Constitutional:Chronically ill-appearing man sitting in bed, in no acute distress. Requires two people to help sit up in bed HENT: normocephalic atraumatic, moist mucous membranes Neck: supple Cardiovascular: irregular rhythm, no m/r/g Pulmonary/Chest: normal work of breathing on room air, mild decrease in LLL lung sounds, otherwise lungs clear to auscultation bilaterally. No wheezing Abdominal: Normal BS, soft, non-tender, non-distended.  MSK: normal bulk and tone. 2+Pitting edema to lower calves now.  Neurological: alert & oriented x 4 (person, month, not year, situation, place), moving all extremities Skin: warm and dry Psych: Pleasant mood and affect    Intake/Output Summary (Last 24 hours) at 02/26/2022 0759 Last data filed at 02/25/2022 1714 Gross per 24 hour  Intake 845.11 ml  Output --  Net 845.11 ml   Net IO Since Admission: 845.11 mL [02/26/22 0759]  Pertinent Labs:    Latest Ref Rng & Units 02/26/2022    4:32 AM 02/25/2022    9:37 AM 07/27/2021    9:51 AM  CBC  WBC 4.0 -  10.5 K/uL 7.2  9.1  4.9   Hemoglobin 13.0 - 17.0 g/dL 12.3  14.4  12.7   Hematocrit 39.0 - 52.0 % 38.5  45.3  39.3   Platelets 150 - 400 K/uL 118  113  156        Latest Ref Rng & Units 02/26/2022    4:32 AM 02/25/2022    9:37 AM 07/27/2021    9:51 AM  CMP  Glucose 70 - 99 mg/dL 91  106  110   BUN 8 - 23 mg/dL '31  21  23   '$ Creatinine 0.61 - 1.24 mg/dL 1.47  1.30  1.68   Sodium 135 - 145 mmol/L 140  142  142   Potassium 3.5 - 5.1 mmol/L 3.7  3.7  3.7   Chloride 98 - 111 mmol/L 109  109  109   CO2 22 - 32 mmol/L '24  25  24   '$ Calcium 8.9 - 10.3 mg/dL 8.4  8.9  9.3   Total Protein 6.5 - 8.1 g/dL  6.1  6.1   Total Bilirubin 0.3 - 1.2 mg/dL  1.7  1.4   Alkaline Phos 38 - 126 U/L  55  76   AST 15 - 41 U/L  64  37   ALT 0 - 44 U/L  23  37     Imaging: ECHOCARDIOGRAM COMPLETE  Result Date: 02/25/2022    ECHOCARDIOGRAM REPORT   Patient Name:   Edwin Wall Date of Exam: 02/25/2022 Medical Rec #:  [id number]            Height:       68.5 in Accession #:    [id number]           Weight:       203.0 lb Date of Birth:  12-07-1940           BSA:          2.068 m Patient Age:    40 years             BP:           135/90 mmHg Patient Gender: M                    HR:           95 bpm. Exam Location:  Inpatient Procedure: 2D Echo Indications:    acute systolic chf  History:        Patient has prior history of Echocardiogram examinations, most                 recent 12/24/2020. Arrythmias:Atrial Fibrillation,                 Signs/Symptoms:Edema; Risk Factors:Dyslipidemia, Hypertension                 and Diabetes.  Sonographer:    Johny Chess RDCS Referring Phys: [id number] NISCHAL NARENDRA IMPRESSIONS  1. Abnoraml septal motion inferior basa hypokinesis . Left ventricular ejection fraction, by estimation, is 45 to 50%. The left ventricle has mildly decreased function. The left ventricle has no regional wall motion abnormalities. Left ventricular diastolic parameters are indeterminate.  2. Right  ventricular systolic function is normal. The right ventricular size is normal.  3. Left atrial size was mildly dilated.  4. The mitral valve is abnormal. Mild mitral valve regurgitation. No evidence of mitral stenosis.  5. The aortic valve is tricuspid. Aortic valve regurgitation is not visualized. No aortic stenosis is present.  6. The inferior vena cava is normal in size with greater than 50% respiratory variability, suggesting right atrial pressure of 3 mmHg. FINDINGS  Left Ventricle: Abnoraml septal motion inferior basa hypokinesis. Left ventricular ejection fraction, by estimation, is 45 to 50%. The left ventricle has mildly decreased function. The left ventricle has no regional wall motion abnormalities. The left ventricular internal cavity size was normal in size. There is no left ventricular hypertrophy. Left ventricular diastolic parameters are indeterminate. Right Ventricle: The right ventricular size is normal. No increase in right ventricular wall thickness. Right ventricular systolic function is normal. Left Atrium: Left atrial size was mildly dilated. Right Atrium: Right atrial size was normal in size. Pericardium: There is no evidence of pericardial effusion. Mitral Valve: The mitral valve is abnormal. There is mild thickening of the mitral valve leaflet(s). Mild mitral valve regurgitation. No evidence of mitral valve stenosis. Tricuspid Valve: The tricuspid valve is normal in structure. Tricuspid valve regurgitation is mild . No evidence of tricuspid stenosis. Aortic Valve: The aortic valve is tricuspid. Aortic valve regurgitation is not visualized. No aortic stenosis is present. Pulmonic Valve: The pulmonic valve was normal in structure. Pulmonic valve regurgitation is not visualized. No evidence of pulmonic stenosis. Aorta: The aortic root is normal in size and structure. Venous: The inferior vena cava is normal in size with greater than 50%  respiratory variability, suggesting right atrial pressure  of 3 mmHg. IAS/Shunts: No atrial level shunt detected by color flow Doppler.  LEFT VENTRICLE PLAX 2D LVIDd:         4.70 cm LVIDs:         3.90 cm LV PW:         1.10 cm LV IVS:        0.90 cm LVOT diam:     2.20 cm LVOT Area:     3.80 cm  RIGHT VENTRICLE         IVC TAPSE (M-mode): 1.2 cm  IVC diam: 1.90 cm LEFT ATRIUM             Index        RIGHT ATRIUM           Index LA diam:        4.00 cm 1.93 cm/m   RA Area:     16.00 cm LA Vol (A2C):   71.9 ml 34.77 ml/m  RA Volume:   42.60 ml  20.60 ml/m LA Vol (A4C):   60.7 ml 29.35 ml/m LA Biplane Vol: 69.3 ml 33.51 ml/m   AORTA Ao Root diam: 3.50 cm Ao Asc diam:  3.50 cm  SHUNTS Systemic Diam: 2.20 cm Jenkins Rouge MD Electronically signed by Jenkins Rouge MD Signature Date/Time: 02/25/2022/6:01:06 PM    Final    CT Angio Chest PE W and/or Wo Contrast  Result Date: 02/25/2022 CLINICAL DATA:  Pulmonary embolus EXAM: CT ANGIOGRAPHY CHEST WITH CONTRAST TECHNIQUE: Multidetector CT imaging of the chest was performed using the standard protocol during bolus administration of intravenous contrast. Multiplanar CT image reconstructions and MIPs were obtained to evaluate the vascular anatomy. RADIATION DOSE REDUCTION: This exam was performed according to the departmental dose-optimization program which includes automated exposure control, adjustment of the mA and/or kV according to patient size and/or use of iterative reconstruction technique. CONTRAST:  92m OMNIPAQUE IOHEXOL 350 MG/ML SOLN COMPARISON:  None Available. FINDINGS: Cardiovascular: No evidence of pulmonary embolus. Normal heart size. No pericardial effusion. Normal caliber thoracic aorta with mild calcified plaque. Mediastinum/Nodes: Enlarged mediastinal lymph nodes are likely reactive, reference subcarinal lymph node measuring 1.7 cm in short axis on series 8, image 71. Esophagus and thyroid are unremarkable. Lungs/Pleura: Central airways are patent. Upper lobe predominant patchy consolidations of the  right lung. Small right-greater-than-left pleural effusions with associated atelectasis. Upper Abdomen: No acute abnormality. Musculoskeletal: No chest wall abnormality. No acute or significant osseous findings. Review of the MIP images confirms the above findings. IMPRESSION: 1. No evidence of pulmonary embolus. 2. Upper lobe predominant patchy consolidations of the right lung, likely due to asymmetric pulmonary edema, possibly related to mitral valve dysfunction. Infection is an additional consideration. 3. Small right-greater-than-left pleural effusions with associated atelectasis. 4. Enlarged mediastinal lymph nodes, favored to be reactive, although given size, follow-up CT is recommended in 3 months to ensure resolution. 5. Aortic Atherosclerosis (ICD10-I70.0). Electronically Signed   By: LYetta GlassmanM.D.   On: 02/25/2022 13:10   CT Head Wo Contrast  Result Date: 02/25/2022 CLINICAL DATA:  Blunt poly trauma EXAM: CT HEAD WITHOUT CONTRAST CT CERVICAL SPINE WITHOUT CONTRAST TECHNIQUE: Multidetector CT imaging of the head and cervical spine was performed following the standard protocol without intravenous contrast. Multiplanar CT image reconstructions of the cervical spine were also generated. RADIATION DOSE REDUCTION: This exam was performed according to the departmental dose-optimization program which includes automated exposure control, adjustment of the mA and/or kV according  to patient size and/or use of iterative reconstruction technique. COMPARISON:  07/27/2021 FINDINGS: CT HEAD FINDINGS Brain: No evidence of acute infarction, hemorrhage, extra-axial collection, ventriculomegaly, or mass effect. 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 Generalized cerebral atrophy. Periventricular white matter low attenuation likely secondary to microangiopathy. Vascular: Cerebrovascular atherosclerotic calcifications are noted. No hyperdense vessels. Skull: Negative for fracture  or focal lesion. Sinuses/Orbits: Visualized portions of the orbits are unremarkable. Visualized portions of the paranasal sinuses are unremarkable. Visualized portions of the mastoid air cells are unremarkable. Other: None. CT CERVICAL SPINE FINDINGS Alignment: Normal. Skull base and vertebrae: No acute fracture. No primary bone lesion or focal pathologic process. Soft tissues and spinal canal: No prevertebral fluid or swelling. No visible canal hematoma. Disc levels: Ankylosis across the C3-4 disc space. Degenerative disease with disc height loss at C4-5, C5-6, C6-7 and T1-2. At C2-3 there is moderate right facet arthropathy, moderate left facet arthropathy. No foraminal stenosis at C2-3. At C3-4 there is a broad-based disc osteophyte complex, bilateral uncovertebral degenerative changes, moderate right foraminal stenosis, and no left foraminal stenosis. At C4-5 there is a broad-based disc osteophyte complex, bilateral uncovertebral degenerative changes and mild right foraminal stenosis. Mild left facet arthropathy at C4-5. At C5-6 there is a broad-based disc osteophyte complex with bilateral uncovertebral degenerative changes, severe right foraminal stenosis, and mild left foraminal stenosis. At C6-7 there is a broad-based disc osteophyte complex impressing upon the thecal sac, bilateral uncovertebral degenerative changes, severe right foraminal stenosis, and moderate-severe left foraminal stenosis. At C7-T1 there is severe right and mild left facet arthropathy. At T1-2 there is a broad-based disc osteophyte complex, and moderate bilateral foraminal stenosis. Partial ankylosis across the T1-2 disc space. Upper chest: Patchy right apical airspace disease partially visualized concerning for pneumonia. Other: No fluid collection or hematoma. IMPRESSION: 1. No acute intracranial pathology. 2. No acute osseous injury of the cervical spine. 3. Cervical spine spondylosis as described above. 4. Patchy right apical  airspace disease partially visualized concerning for pneumonia. Electronically Signed   By: Kathreen Devoid M.D.   On: 02/25/2022 09:42   CT Cervical Spine Wo Contrast  Result Date: 02/25/2022 CLINICAL DATA:  Blunt poly trauma EXAM: CT HEAD WITHOUT CONTRAST CT CERVICAL SPINE WITHOUT CONTRAST TECHNIQUE: Multidetector CT imaging of the head and cervical spine was performed following the standard protocol without intravenous contrast. Multiplanar CT image reconstructions of the cervical spine were also generated. RADIATION DOSE REDUCTION: This exam was performed according to the departmental dose-optimization program which includes automated exposure control, adjustment of the mA and/or kV according to patient size and/or use of iterative reconstruction technique. COMPARISON:  07/27/2021 FINDINGS: CT HEAD FINDINGS Brain: No evidence of acute infarction, hemorrhage, extra-axial collection, ventriculomegaly, or mass effect. 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 Generalized cerebral atrophy. Periventricular white matter low attenuation likely secondary to microangiopathy. Vascular: Cerebrovascular atherosclerotic calcifications are noted. No hyperdense vessels. Skull: Negative for fracture or focal lesion. Sinuses/Orbits: Visualized portions of the orbits are unremarkable. Visualized portions of the paranasal sinuses are unremarkable. Visualized portions of the mastoid air cells are unremarkable. Other: None. CT CERVICAL SPINE FINDINGS Alignment: Normal. Skull base and vertebrae: No acute fracture. No primary bone lesion or focal pathologic process. Soft tissues and spinal canal: No prevertebral fluid or swelling. No visible canal hematoma. Disc levels: Ankylosis across the C3-4 disc space. Degenerative disease with disc height loss at C4-5, C5-6, C6-7 and T1-2. At C2-3 there is moderate right facet arthropathy,  moderate left facet arthropathy. No foraminal stenosis at C2-3. At C3-4  there is a broad-based disc osteophyte complex, bilateral uncovertebral degenerative changes, moderate right foraminal stenosis, and no left foraminal stenosis. At C4-5 there is a broad-based disc osteophyte complex, bilateral uncovertebral degenerative changes and mild right foraminal stenosis. Mild left facet arthropathy at C4-5. At C5-6 there is a broad-based disc osteophyte complex with bilateral uncovertebral degenerative changes, severe right foraminal stenosis, and mild left foraminal stenosis. At C6-7 there is a broad-based disc osteophyte complex impressing upon the thecal sac, bilateral uncovertebral degenerative changes, severe right foraminal stenosis, and moderate-severe left foraminal stenosis. At C7-T1 there is severe right and mild left facet arthropathy. At T1-2 there is a broad-based disc osteophyte complex, and moderate bilateral foraminal stenosis. Partial ankylosis across the T1-2 disc space. Upper chest: Patchy right apical airspace disease partially visualized concerning for pneumonia. Other: No fluid collection or hematoma. IMPRESSION: 1. No acute intracranial pathology. 2. No acute osseous injury of the cervical spine. 3. Cervical spine spondylosis as described above. 4. Patchy right apical airspace disease partially visualized concerning for pneumonia. Electronically Signed   By: Kathreen Devoid M.D.   On: 02/25/2022 09:42   DG Forearm Right  Result Date: 02/25/2022 CLINICAL DATA:  Fall EXAM: RIGHT FOREARM - 2 VIEW COMPARISON:  None Available. FINDINGS: There is no evidence of fracture or other focal bone lesions. Soft tissues are unremarkable. IMPRESSION: Negative. Electronically Signed   By: Yetta Glassman M.D.   On: 02/25/2022 09:09   DG Chest Portable 1 View  Result Date: 02/25/2022 CLINICAL DATA:  Cough, weakness. EXAM: PORTABLE CHEST 1 VIEW COMPARISON:  Jul 27, 2021. FINDINGS: Stable cardiomegaly. Left lung is clear. Increased right upper lobe opacity is noted which may  represent pneumonia, although nodular opacity is noted in right upper lobe. Bony thorax is unremarkable. IMPRESSION: Increased right upper lobe opacity is noted which may represent pneumonia, although nodular opacity is noted in right upper lobe concerning for possible mass or neoplasm. CT scan is recommended for further evaluation. Electronically Signed   By: Marijo Conception M.D.   On: 02/25/2022 09:01    Assessment/Plan:   Principal Problem:   Pneumonia due to COVID-19 virus Active Problems:   Acute on chronic heart failure [facility])   Patient Summary:  Mr. Trai Ells is an 81yo M with a PHMx significant for prior CVA, HFrEF, Afib on Eliquis, HLD, Seizures admitted for COVID PNA and acute on CHF, improving  Plan:  COVID  Possible PNA COVID positive at presentation. CXR and CTA with findings of RUL infiltrate suggestive of PNA vs pulmonary edema No URI symptoms today, continues to feel weak, however, afebrile and maintaining oxygen saturation on R. Pulm exam improved. No leukocytosis.Normal ferritin. Normal lactic acid today. Calcitonin <0.10. Dc'd antibiotics. Initially planned to start pt on Remdesivir; however, today is protentially day 5-6 of symptoms, questioning the utility of this medication at this time. Suspect RUL infiltrate in the setting of pulmonary edema vs pneumonia as lung exam improved today s/p 1x lasix. Question family's ability to care for patient given deconditioning 2/2 COVID. Will await PT/OT evaluation as patient can continue being managed in the outpatient setting.  -d/c remdesivir -Continue to monitor respiratory status -Conservative measures for cough, URI symtoms -Continue on isolation  Acute on chronic HF TTE with EF 45-50%, unchanged on repeat TTE on 12/26. Evidence of volume overload on presentation,  improved today s/p 1x 20 mg IV lasix. Cr. 1.7, close to baseline.  -  Repeat Lasix 20 mg IV today -Strict I/O and daily weights  CKD3a Baseline Cr 1.5-1.8.  Cr today 1.7 GFR 48 -Continue to monitor  Permanent Afib on Eliquis Prior CVA Rate controlled without medications. No evidence of bleeding on exam or radiographic evaluation -Continue Eliquis 2.5 mg BID   Seizures Followed by neurology.  No recent seizure episodes since being started on Keppra 750 mg daily -Continue Keppra 750 mg   Left MCA CVA HLD  - Continue Crestor 20 mg -Continue Zetia 10 mg   DM Not on medications. Last A1c 2022 6.2. -Continue monitoring blood glucose   Diet: Heart Healthy VTE: Eliquis Code: DNR PT/OT recs: Pending, other pending. Family Update:   Dispo: Anticipated discharge to  pending PT evaluation  in 1-2 days   Romana Juniper, MD Internal Medicine Resident PGY-1 Please contact the on call pager after 5 pm and on weekends at [phone].

## 2022-02-26 NOTE — Plan of Care (Signed)
Pt is a one person assist for stand and pivot transfers. Pt assisted to chair with therapy. Pt returned to chair with the help of this RN. Pt eating majority of each meal.  Problem: Respiratory: Goal: Will maintain a patent airway Outcome: Progressing Goal: Complications related to the disease process, condition or treatment will be avoided or minimized Outcome: Progressing   Problem: Clinical Measurements: Goal: Ability to maintain clinical measurements within normal limits will improve Outcome: Progressing Goal: Will remain free from infection Outcome: Progressing Goal: Respiratory complications will improve Outcome: Progressing Goal: Cardiovascular complication will be avoided Outcome: Progressing   Problem: Nutrition: Goal: Adequate nutrition will be maintained Outcome: Progressing   Problem: Elimination: Goal: Will not experience complications related to bowel motility Outcome: Progressing Goal: Will not experience complications related to urinary retention Outcome: Progressing   Problem: Pain Managment: Goal: General experience of comfort will improve Outcome: Progressing   Problem: Safety: Goal: Ability to remain free from injury will improve Outcome: Progressing   Problem: Skin Integrity: Goal: Risk for impaired skin integrity will decrease Outcome: Progressing   Problem: Health Behavior/Discharge Planning: Goal: Ability to manage health-related needs will improve Outcome: Not Progressing

## 2022-02-26 NOTE — Progress Notes (Signed)
PT Cancellation Note  Patient Details Name: Edwin Wall MRN: [id number] DOB: 1941/01/02   Cancelled Treatment:    Reason Eval/Treat Not Completed: Other (comment).  Admission to the floor and will retry at another time.   Ramond Dial 02/26/2022, 10:43 AM  Mee Hives, PT PhD Acute Rehab Dept. Number: [place] [facility] [place]

## 2022-02-26 NOTE — Evaluation (Signed)
Occupational Therapy Evaluation Patient Details Name: Edwin Wall MRN: [id number] DOB: 24-Jul-1940 Today's Date: 02/26/2022   History of Present Illness Pt is an 81 y/o male who presented after a fall at home where family was unable to assist him up so pt remained on floor >3 days. Negative for acute fx. Found to be COVID+. PMH: CVA, HFeEF, a fib, HLD, seizures.   Clinical Impression   PTA, pt lives with spouse and son, typically Independent with ADLs, IADLs and mobility without AD. Pt presents now with deficits in strength, standing balance and overall endurance. Pt able to progress to Min A x 2 for basic transfers though reliant on RW currently to prevent posterior LOB. Pt requires Setup for UB ADL and up to Mod A for LB ADLs. Based on current deficits and family unable to assist due to their current illnesses, rec SNF rehab at [place]. However, pt reports he is not interested in SNF at this time and hopeful to DC home.     Sitting EOB: 120/82 Standing: 100/64 After transfer: 129/82 HR up to 128bpm   Recommendations for follow up therapy are one component of a multi-disciplinary discharge planning process, led by the attending physician.  Recommendations may be updated based on patient status, additional functional criteria and insurance authorization.   Follow Up Recommendations  Skilled nursing-short term rehab (<3 hours/day) (if pt refuses SNF, will need max HH services)     Assistance Recommended at Discharge Frequent or constant Supervision/Assistance  Patient can return home with the following A little help with walking and/or transfers;A lot of help with bathing/dressing/bathroom    Functional Status Assessment  Patient has had a recent decline in their functional status and demonstrates the ability to make significant improvements in function in a reasonable and predictable amount of time.  Equipment Recommendations  Other (comment) (TBD pending progress)     Recommendations for Other Services       Precautions / Restrictions Precautions Precautions: Fall;Other (comment) Precaution Comments: monitor orthostatics, HR Restrictions Weight Bearing Restrictions: No      Mobility Bed Mobility Overal bed mobility: Needs Assistance Bed Mobility: Supine to Sit     Supine to sit: Max assist, +2 for physical assistance, +2 for safety/equipment, HOB elevated     General bed mobility comments: required assist for RLE > LLE to EOB, assist to roll towards EOB with bed pad w/ assist to lift trunk    Transfers Overall transfer level: Needs assistance Equipment used: Rolling walker (2 wheels) Transfers: Bed to chair/wheelchair/BSC, Sit to/from Stand Sit to Stand: Min assist, Mod assist, +2 safety/equipment     Step pivot transfers: Min assist, +2 physical assistance, +2 safety/equipment     General transfer comment: Mod A x 2 for initial stand with assist to correct posterior LOB, able to progress standing to Min A x 2 for second trial and pivot to chair with cues for staying close to RW      Balance Overall balance assessment: Needs assistance Sitting-balance support: No upper extremity supported, Feet supported Sitting balance-Leahy Scale: Fair     Standing balance support: Bilateral upper extremity supported, During functional activity Standing balance-Leahy Scale: Poor                             ADL either performed or assessed with clinical judgement   ADL Overall ADL's : Needs assistance/impaired Eating/Feeding: Independent   Grooming: Min guard;Standing   Upper Body Bathing:  Set up;Sitting   Lower Body Bathing: Minimal assistance;Sit to/from stand   Upper Body Dressing : Set up;Sitting   Lower Body Dressing: Moderate assistance;Sit to/from stand Lower Body Dressing Details (indicate cue type and reason): difficulty attempting to don socks, unable to bend to feet successfully and unable to cross LEs. Max A  for sock mgmt Toilet Transfer: Minimal assistance;+2 for safety/equipment;+2 for physical assistance;Stand-pivot;Rolling walker (2 wheels)   Toileting- Clothing Manipulation and Hygiene: Moderate assistance;Sitting/lateral lean;Sit to/from stand         General ADL Comments: Limited by balance deficits, weakness from prolonged time on floor after fall at home     Vision Ability to See in Adequate Light: 0 Adequate Patient Visual Report: No change from baseline Vision Assessment?: No apparent visual deficits     Perception     Praxis      Pertinent Vitals/Pain Pain Assessment Pain Assessment: Faces Faces Pain Scale: Hurts a little bit Pain Location: low back Pain Descriptors / Indicators: Grimacing, Sore Pain Intervention(s): Monitored during session     Hand Dominance Right   Extremity/Trunk Assessment Upper Extremity Assessment Upper Extremity Assessment: Generalized weakness   Lower Extremity Assessment Lower Extremity Assessment: Defer to PT evaluation   Cervical / Trunk Assessment Cervical / Trunk Assessment: Normal   Communication Communication Communication: No difficulties   Cognition Arousal/Alertness: Awake/alert Behavior During Therapy: WFL for tasks assessed/performed Overall Cognitive Status: Impaired/Different from baseline Area of Impairment: Safety/judgement, Awareness                         Safety/Judgement: Decreased awareness of deficits, Decreased awareness of safety Awareness: Emergent   General Comments: pleasant, follows directions well. does show some decreased insight into deficits and safety w/ observable balance issues though hesitant for rehab     General Comments       Exercises     Shoulder Instructions      Home Living Family/patient expects to be discharged to:: Private residence Living Arrangements: Spouse/significant other;Children Available Help at Discharge: Family;Available 24 hours/day Type of Home:  House Home Access: Stairs to enter [employer] of Steps: 5 in front of home and 2 in back of home Entrance Stairs-Rails: Right;Left;Can reach both Home Layout: One level     Bathroom Shower/Tub: Tub/shower unit;Walk-in shower;Door   Bathroom Toilet: Handicapped height Bathroom Accessibility: Yes   Home Equipment: Cane - single point;Shower Land (2 wheels);Rollator (4 wheels);BSC/3in1   Additional Comments: RW in the shower. Both pt's wife and son currently sick      Prior Functioning/Environment Prior Level of Function : Independent/Modified Independent             Mobility Comments: denies any use of AD. per prior admissions, pt uses cane intermittently ADLs Comments: ADLs, was taking out the trash when he fell at home        OT Problem List: Decreased strength;Decreased activity tolerance;Impaired balance (sitting and/or standing);Decreased safety awareness;Decreased knowledge of use of DME or AE      OT Treatment/Interventions: Self-care/ADL training;Therapeutic exercise;Energy conservation;DME and/or AE instruction;Therapeutic activities    OT Goals(Current goals can be found in the care plan section) Acute Rehab OT Goals Patient Stated Goal: be able to progress to home OT Goal Formulation: With patient Time For Goal Achievement: 03/12/22 Potential to Achieve Goals: Good  OT Frequency: Min 2X/week    Co-evaluation              AM-PAC OT "6 Clicks" Daily  Activity     Outcome Measure Help from another person eating meals?: None Help from another person taking care of personal grooming?: A Little Help from another person toileting, which includes using toliet, bedpan, or urinal?: A Lot Help from another person bathing (including washing, rinsing, drying)?: A Lot Help from another person to put on and taking off regular upper body clothing?: A Little Help from another person to put on and taking off regular lower body clothing?: A  Lot 6 Click Score: 16   End of Session Equipment Utilized During Treatment: Gait belt;Rolling walker (2 wheels) Nurse Communication: Mobility status  Activity Tolerance: Patient tolerated treatment well Patient left: in chair;with call bell/phone within reach;with chair alarm set  OT Visit Diagnosis: Unsteadiness on feet (R26.81);Other abnormalities of gait and mobility (R26.89);Muscle weakness (generalized) (M62.81)                Time: 1335-1401 OT Time Calculation (min): 26 min Charges:  OT General Charges $OT Visit: 1 Visit OT Evaluation $OT Eval Moderate Complexity: 1 Mod  Malachy Chamber, OTR/L Acute Rehab Services Office: [phone]   Layla Maw 02/26/2022, 2:14 PM

## 2022-02-26 NOTE — Hospital Course (Addendum)
Weakness COVID Pneumonia Patient presented to the ED with decreased strength and inability to move from ground after mechanical fall. Patient with cough and decreased appetite on admission. No oxygen requirement, afebrile, and clear to auscultation on exam. Chest x-ray and CTA showed a right upper lobe infiltrate was concerning for infection versus atypical edema. Initially treated with antibiotics and planned to receive Remdesivir. No leukocytosis on admission. Procalcitonin <0.10, low risk for bacterial infection at this time; antibiotics were discontinued and patient did not receive Remdesivir. Patient also received one time dose of IV lasix, with improvement of volume status on re-evaluation. Suspect that RUL infiltrate  to be pulmonary edema in setting of acute on chronic HF. He was treated conservatively with Guafenasin for cough. PT evaluation recommended SNF for short term rehab. Patient is stable for discharge once a bed becomes available  Acute on chronic systolic heart failure Patient presented with 2+ LE extremity edema and JVP to lower mandible. CXR and CT chest with RUL infiltrate concerning for atypical edema and bilateral small pleural effusions. Overall picture consisted with fluid overload in the setting of acute HF exacerbation. Repeat TTE with LVEF 45-50% similar to prior and IVC normal in size. Patient received two 20 mg IV lasix thus far.  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 Cr 12/27 1.7 GFR 48   Permanent Afib on Eliquis Prior CVA Rate controlled without medications. No evidence of bleeding on exam or radiographic evaluation. Continued home  Eliquis 2.5 mg BID   Seizures -Followed by neurology.  No recent seizure episodes since being started on Keppra 750 mg daily.   Left MCA CVA HLD  Resumed home Crestor 20 mg and Zetia 10 mg   DM Not on  medications. Last A1c 2022 6.2. Monitored blood glucose during hospitalization.

## 2022-02-26 NOTE — Progress Notes (Signed)
OT Cancellation Note  Patient Details Name: Edwin Wall MRN: [id number] DOB: 1941-01-27   Cancelled Treatment:    Reason Eval/Treat Not Completed: Other (comment) Pt being transferred to a room from the ED. Will check back for OT eval as schedule permits.  Layla Maw 02/26/2022, 8:55 AM

## 2022-02-26 NOTE — Progress Notes (Signed)
Pt arrived to unit 3E room 4 via bed from ED. Pt A&O x2: self and place. Pt has IV x1 and on room air. Pt oriented to unit and room. CHG bath given; pt placed on continuous tele. This RN assisted pt to call wife. Bed alarm turned on. Call bell given to pt.

## 2022-02-26 NOTE — Progress Notes (Signed)
Internal Medicine Attending:   I saw and examined the patient. I reviewed the resident's H&P note and I agree with the resident's findings and plan as documented in the resident's note.  In brief, patient is an 81 year old male with past medical history of CVA, chronic systolic heart failure, A-fib on Eliquis, hyperlipidemia, seizure disorder who presented to the ED after mechanical fall and weakness.  Patient states that he was in the kitchen taking out his garbage and tripped and fell to the ground but did not have the strength to get up.  His wife has been sick for the last 7 days and his adult son has also been sick and they were unable to help him up.  His daughter also visited from [place] and attempted with the patient but was unable to.  Patient stayed on the ground for the last 2 to 3 days prior to coming in to the hospital.  Of note, patient has had mild cough and decreased appetite for 2 to 3 days prior to the incident.  Patient is also missed some of his medications for the last 3 days as his wife was not only able to give him some of his medications including his Eliquis.  Today, patient states that he feels better today and denies any new complaints.  Vitals:   02/26/22 0840 02/26/22 1244  BP: 135/80 95/74  Pulse: 98 99  Resp: 20 16  Temp: 98.3 F (36.8 C)   SpO2: 98% 99%   On exam, patient is resting in bed in no apparent distress.  Lungs are clear to auscultation bilaterally.  Abdomen is soft, nontender, nondistended with normoactive bowel sounds.  Cardiovascular exam reveals irregularly irregular rhythm but normal rate.  Bilateral lower extremities with 2+ edema noted in the feet and ankles and are nontender to palpation.  Patient is oriented x 3 and has a normal mood and affect.  Assessment and plan:  Weakness secondary to COVID infection and possible pneumonia: -Patient presented to the ED with decreased strength and inability to get himself off the ground for the last 3 days.   Patient also had associated cough and decreased appetite.  Chest x-ray and CTA showed a right upper lobe infiltrate was concerning for infection versus atypical edema.  Patient's procalcitonin level was within normal limits taking infection less likely.  I suspect that patient's infiltrate is likely secondary to atypical edema given his normal procalcitonin level and improvement with diuresis. -Patient was initially supposed to be started on remdesivir today but given that he is now on day 5-6 symptoms there is not much utility to this medication.  Will not initiate it at this time -Patient currently with O2 sats in the 90s on room air and no other evidence of severe infection at this time.  Will continue to monitor closely -PT/OT to evaluate patient today.  I suspect patient will need admission to SNF -No further workup at this time  2.  Acute on chronic systolic heart failure exacerbation: -Patient did have a cough for the last 2 to 3 days likely secondary to his underlying COVID infection but is also noted to have likely atypical edema on his CTA chest as well as 2+ bilateral lower extremity pitting edema consistent with an acute on chronic heart failure exacerbation. -Continue strict I's and O's and daily weights -Patient is status post Lasix 20 mg IV x 1 yesterday with improvement in his lower extremity edema as well as his breathing. -Will repeat Lasix 20 mg IV  x 1 today given some persistent lower extremity edema on exam -2D echo showed an EF of 45 to 50% as well as an IVC that was normal in size with greater than 50% variability indicating almost normal volume status -No further workup at this time

## 2022-02-26 NOTE — ED Notes (Signed)
Pt placed on hospital bed

## 2022-02-26 NOTE — Evaluation (Signed)
Physical Therapy Evaluation Patient Details Name: Edwin Wall MRN: [id number] DOB: 1940/06/19 Today's Date: 02/26/2022  History of Present Illness  Pt is an 81 y/o male who presented after a fall at home where family was unable to assist him up so pt remained on floor >3 days. Negative for acute fx. Found to be COVID+. PMH: CVA, HFeEF, a fib, HLD, seizures.  Clinical Impression  Pt was seen for mobility and was unable to take steps more than to get to chair after being able to climb 2-5 steps to enter his home and walk with a SPC at most.  Pt is sick with Covid as is his family, as well as being physically debilitated.  Pt is recommended to SNF care due to magnitude of weakness and lack of family ability to manage his current physical decline.  Follow acutely to progress him as possible since pt may refuse to go to a rehab setting.       Recommendations for follow up therapy are one component of a multi-disciplinary discharge planning process, led by the attending physician.  Recommendations may be updated based on patient status, additional functional criteria and insurance authorization.  Follow Up Recommendations Skilled nursing-short term rehab (<3 hours/day) Can patient physically be transported by private vehicle: No    Assistance Recommended at Discharge Frequent or constant Supervision/Assistance  Patient can return home with the following  Two people to help with walking and/or transfers;A lot of help with bathing/dressing/bathroom;Assistance with cooking/housework;Direct supervision/assist for medications management;Direct supervision/assist for financial management;Assist for transportation;Help with stairs or ramp for entrance    Equipment Recommendations None recommended by PT  Recommendations for Other Services       Functional Status Assessment Patient has had a recent decline in their functional status and demonstrates the ability to make significant improvements  in function in a reasonable and predictable amount of time.     Precautions / Restrictions Precautions Precautions: Fall;Other (comment) Precaution Comments: monitor orthostatics, HR Restrictions Weight Bearing Restrictions: No      Mobility  Bed Mobility Overal bed mobility: Needs Assistance Bed Mobility: Supine to Sit     Supine to sit: Max assist, +2 for physical assistance, +2 for safety/equipment          Transfers Overall transfer level: Needs assistance Equipment used: Rolling walker (2 wheels) Transfers: Sit to/from Stand, Bed to chair/wheelchair/BSC Sit to Stand: Mod assist, +2 physical assistance, +2 safety/equipment   Step pivot transfers: Min assist, +2 physical assistance, +2 safety/equipment       General transfer comment: standing with some pt concern for generating the effort    Ambulation/Gait               General Gait Details: pt steps were a part of transfers and not functional gait  Stairs            Wheelchair Mobility    Modified Rankin (Stroke Patients Only)       Balance Overall balance assessment: Needs assistance Sitting-balance support: Feet supported Sitting balance-Leahy Scale: Fair     Standing balance support: Bilateral upper extremity supported, During functional activity Standing balance-Leahy Scale: Poor                               Pertinent Vitals/Pain Pain Assessment Pain Assessment: Faces Pain Score: 2  Faces Pain Scale: Hurts a little bit Pain Location: low back Pain Descriptors / Indicators: Grimacing, Sore Pain Intervention(s):  Limited activity within patient's tolerance, Monitored during session, Premedicated before session, Repositioned    Home Living Family/patient expects to be discharged to:: Private residence Living Arrangements: Spouse/significant other;Children Available Help at Discharge: Family;Available 24 hours/day Type of Home: House Home Access: Stairs to  enter Entrance Stairs-Rails: Right;Left;Can reach both Entrance Stairs-Number of Steps: 5 in front of home and 2 in back of home   Home Layout: One level Home Equipment: Cane - single point;Shower Land (2 wheels);Rollator (4 wheels);BSC/3in1 Additional Comments: RW with seat in shower and family not well with covid    Prior Function Prior Level of Function : Independent/Modified Independent             Mobility Comments: SPC at times ADLs Comments: ADLs, was taking out the trash when he fell at home     Hand Dominance   Dominant Hand: Right    Extremity/Trunk Assessment   Upper Extremity Assessment Upper Extremity Assessment: Defer to OT evaluation    Lower Extremity Assessment Lower Extremity Assessment: Generalized weakness    Cervical / Trunk Assessment Cervical / Trunk Assessment: Other exceptions (mild kyphosis and trunk weakness)  Communication   Communication: No difficulties  Cognition Arousal/Alertness: Awake/alert Behavior During Therapy: Anxious, WFL for tasks assessed/performed Overall Cognitive Status: Impaired/Different from baseline Area of Impairment: Safety/judgement, Problem solving, Memory, Attention, Following commands                   Current Attention Level: Selective Memory: Decreased short-term memory, Decreased recall of precautions Following Commands: Follows one step commands with increased time Safety/Judgement: Decreased awareness of deficits, Decreased awareness of safety Awareness: Intellectual, Emergent Problem Solving: Slow processing, Difficulty sequencing, Requires verbal cues, Requires tactile cues General Comments: following directions with extra time, but also does not see his issues of mobility        General Comments General comments (skin integrity, edema, etc.): pt was assisted to move with directions and specific cues for hand placement and safety of walker use.  Pt is distracted during session and  is requiring redirection for steps to chair and to set up to sit, esp to know how close chair is to him    Exercises     Assessment/Plan    PT Assessment Patient needs continued PT services  PT Problem List Decreased strength;Decreased activity tolerance;Decreased range of motion;Decreased balance;Decreased mobility;Decreased coordination;Decreased cognition;Decreased knowledge of use of DME;Decreased safety awareness;Obesity;Cardiopulmonary status limiting activity       PT Treatment Interventions DME instruction;Gait training;Stair training;Functional mobility training;Therapeutic activities;Therapeutic exercise;Balance training;Neuromuscular re-education;Patient/family education    PT Goals (Current goals can be found in the Care Plan section)  Acute Rehab PT Goals Patient Stated Goal: to get home with family and skip rehab PT Goal Formulation: With patient Time For Goal Achievement: 03/12/22 Potential to Achieve Goals: Good    Frequency Min 3X/week     Co-evaluation PT/OT/SLP Co-Evaluation/Treatment: Yes Reason for Co-Treatment: For patient/therapist safety;To address functional/ADL transfers;Necessary to address cognition/behavior during functional activity PT goals addressed during session: Mobility/safety with mobility;Balance;Proper use of DME         AM-PAC PT "6 Clicks" Mobility  Outcome Measure Help needed turning from your back to your side while in a flat bed without using bedrails?: A Lot Help needed moving from lying on your back to sitting on the side of a flat bed without using bedrails?: A Lot Help needed moving to and from a bed to a chair (including a wheelchair)?: A Lot Help needed standing up  from a chair using your arms (e.g., wheelchair or bedside chair)?: Total Help needed to walk in hospital room?: Total Help needed climbing 3-5 steps with a railing? : Total 6 Click Score: 9    End of Session Equipment Utilized During Treatment: Gait  belt Activity Tolerance: Patient limited by fatigue;Treatment limited secondary to medical complications (Comment) Patient left: in chair;with call bell/phone within reach;with chair alarm set Nurse Communication: Mobility status;Other (comment) (transfer information for return to bed) PT Visit Diagnosis: Unsteadiness on feet (R26.81);Muscle weakness (generalized) (M62.81);Difficulty in walking, not elsewhere classified (R26.2)    Time: 5732-2025 PT Time Calculation (min) (ACUTE ONLY): 29 min   Charges:   PT Evaluation $PT Eval Moderate Complexity: 1 Mod         Ramond Dial 02/26/2022, 3:08 PM  Mee Hives, PT PhD Acute Rehab Dept. Number: [place] [facility] [place]

## 2022-02-27 DIAGNOSIS — U071 COVID-19: Secondary | ICD-10-CM | POA: Diagnosis not present

## 2022-02-27 LAB — BASIC METABOLIC PANEL
Anion gap: 8 (ref 5–15)
BUN: 36 mg/dL — ABNORMAL HIGH (ref 8–23)
CO2: 26 mmol/L (ref 22–32)
Calcium: 8.3 mg/dL — ABNORMAL LOW (ref 8.9–10.3)
Chloride: 105 mmol/L (ref 98–111)
Creatinine, Ser: 1.65 mg/dL — ABNORMAL HIGH (ref 0.61–1.24)
GFR, Estimated: 41 mL/min — ABNORMAL LOW (ref >60)
Glucose, Bld: 95 mg/dL (ref 70–99)
Potassium: 3.2 mmol/L — ABNORMAL LOW (ref 3.5–5.1)
Sodium: 139 mmol/L (ref 135–145)

## 2022-02-27 LAB — CBC
HCT: 37 % — ABNORMAL LOW (ref 39.0–52.0)
Hemoglobin: 11.8 g/dL — ABNORMAL LOW (ref 13.0–17.0)
MCH: 30.6 pg (ref 26.0–34.0)
MCHC: 31.9 g/dL (ref 30.0–36.0)
MCV: 95.9 fL (ref 80.0–100.0)
Platelets: 118 10*3/uL — ABNORMAL LOW (ref 150–400)
RBC: 3.86 MIL/uL — ABNORMAL LOW (ref 4.22–5.81)
RDW: 13.3 % (ref 11.5–15.5)
WBC: 5.2 10*3/uL (ref 4.0–10.5)
nRBC: 0 % (ref 0.0–0.2)

## 2022-02-27 LAB — MAGNESIUM: Magnesium: 1.8 mg/dL (ref 1.7–2.4)

## 2022-02-27 MED ORDER — FUROSEMIDE 20 MG PO TABS
20.0000 mg | ORAL_TABLET | ORAL | Status: DC
  Administered 2022-02-27 – 2022-03-01 (×2): 20 mg via ORAL
  Filled 2022-02-27 (×2): qty 1

## 2022-02-27 MED ORDER — POTASSIUM CHLORIDE CRYS ER 20 MEQ PO TBCR
40.0000 meq | EXTENDED_RELEASE_TABLET | Freq: Two times a day (BID) | ORAL | Status: AC
  Administered 2022-02-27 (×2): 40 meq via ORAL
  Filled 2022-02-27 (×2): qty 2

## 2022-02-27 NOTE — Progress Notes (Signed)
CSW spoke with pt wife. CSW explained that the recommendation is SNF. Pt wife is unsure if her husband will agree to SNF and plans on coming to the hospital tomorrow to speak with him and staff.   Beckey Rutter, MSW, LCSWA, LCASA Transitions of Care  Clinical Social Worker I

## 2022-02-27 NOTE — Progress Notes (Signed)
Hospital day#0 Subjective:   Summary: [name] is an 81yo M with a PHMx significant for prior CVA, HFrEF, Afib on Eliquis, HLD, Seizures admitted for COVID PNA and acute on CHF  Overnight Events: None  No pain or discomfort today. Denies n/v, SOB, chest pain. Endorses some weakness in his legs. Unsure when Covid symptoms began but feels like it was since admission. Discussed potential need to be discharged to SNF with rehab prior to going home. He is ok with this as his family is all sick with covid currently and he doesn't want to go home.  Objective:  Vital signs in last 24 hours: Vitals:   02/26/22 1952 02/26/22 2355 02/27/22 0400 02/27/22 0741  BP: 104/80 105/76 107/67 109/72  Pulse: 99 73 61 86  Resp: '20 10 19 17  '$ Temp: 98.5 F (36.9 C) 99.1 F (37.3 C)  99.1 F (37.3 C)  TempSrc: Oral Oral  Oral  SpO2: 95% 98% 96% 94%  Weight:      Height:       Supplemental O2: Room Air SpO2: 94 % Filed Weights   02/26/22 0344 02/26/22 0840  Weight: 94 kg 90 kg    Physical Exam:  Constitutional:Chronically ill-appearing man sitting in bed, in no acute distress. Requires two people to help sit up in bed HENT: normocephalic atraumatic, moist mucous membranes Neck: supple Cardiovascular: regular rate, irregular rhythm, no murmurs Pulmonary/Chest: normal work of breathing on room air, lungs clear to auscultation bilaterally. No wheezing MSK: normal bulk and tone. Trace pitting edema at bilateral lower extremities.  Neurological: alert & oriented x 4 (person, month, not year, situation, place), moving all extremities Skin: warm and dry Psych: Pleasant mood and affect    Intake/Output Summary (Last 24 hours) at 02/27/2022 0912 Last data filed at 02/27/2022 0820 Gross per 24 hour  Intake 1440 ml  Output 1850 ml  Net -410 ml    Net IO Since Admission: 672.11 mL [02/27/22 0912]  Pertinent Labs:    Latest Ref Rng & Units 02/27/2022    3:11 AM 02/26/2022     4:32 AM 02/25/2022    9:37 AM  CBC  WBC 4.0 - 10.5 K/uL 5.2  7.2  9.1   Hemoglobin 13.0 - 17.0 g/dL 11.8  12.3  14.4   Hematocrit 39.0 - 52.0 % 37.0  38.5  45.3   Platelets 150 - 400 K/uL 118  118  113        Latest Ref Rng & Units 02/27/2022    3:11 AM 02/26/2022    4:32 AM 02/25/2022    9:37 AM  CMP  Glucose 70 - 99 mg/dL 95  91  106   BUN 8 - 23 mg/dL 36  31  21   Creatinine 0.61 - 1.24 mg/dL 1.65  1.47  1.30   Sodium 135 - 145 mmol/L 139  140  142   Potassium 3.5 - 5.1 mmol/L 3.2  3.7  3.7   Chloride 98 - 111 mmol/L 105  109  109   CO2 22 - 32 mmol/L '26  24  25   '$ Calcium 8.9 - 10.3 mg/dL 8.3  8.4  8.9   Total Protein 6.5 - 8.1 g/dL   6.1   Total Bilirubin 0.3 - 1.2 mg/dL   1.7   Alkaline Phos 38 - 126 U/L   55   AST 15 - 41 U/L   64   ALT 0 - 44 U/L   23  Imaging: No results found.  Assessment/Plan:   Principal Problem:   Pneumonia due to COVID-19 virus Active Problems:   Acute on chronic heart failure [facility])   Patient Summary:  [name] is an 81yo M with a PHMx significant for prior CVA, HFrEF, Afib on Eliquis, HLD, Seizures admitted for COVID PNA and acute on CHF, improving  Plan:  COVID  Possible PNA COVID positive at presentation. CXR and CTA with findings of RUL infiltrate suggestive of PNA vs pulmonary edema. Continues without URI symptoms today but continues to endorse leg weakness. Afebrile and maintaining oxygen saturation on RA. Pulm exam  wnl. No leukocytosis. Decided to forego remdisivir given later in symptom course. Suspected initial RUL infiltrate asymmetric pulmonary edema vs pneumonia as lung exam improving now s/p 2x IV lasix. PT/OT recommend SNF and patient is amenable. TOC consulted for placement. -Continue to monitor respiratory status -Conservative measures for cough, URI symtoms -Continue isolation  Acute on chronic HF TTE with EF 45-50%, unchanged on repeat TTE on 12/26. Evidence of volume overload on presentation but  has since improved now s/p 2x 20 mg IV lasix. Lungs CTAB, trace lower leg edema. Cr. 1.65, close to baseline.  -Transition to home Lasix 20 mg every other day -Strict I/O and daily weights  CKD3a Baseline Cr 1.5-1.8. Cr today 1.65. GFR 41. -Continue to monitor  Permanent Afib on Eliquis Prior CVA Rate controlled without medications. No evidence of bleeding on exam or radiographic evaluation -Continue Eliquis 2.5 mg BID   Seizures Followed by neurology.  No recent seizure episodes since being started on Keppra 750 mg daily -Continue Keppra 750 mg   Left MCA CVA HLD  - Continue Crestor 20 mg -Continue Zetia 10 mg   DM Not on medications. Last A1c 2022 6.2. -Continue monitoring blood glucose   Diet: Heart Healthy VTE: Eliquis Code: DNR PT/OT recs: Pending, other pending. Family Update:   Dispo: Anticipated discharge to  pending PT evaluation  in 1-2 days   Linward Natal, MD Internal Medicine Resident PGY-1 Please contact the on call pager after 5 pm and on weekends at [phone].

## 2022-02-27 NOTE — Plan of Care (Signed)

## 2022-02-28 DIAGNOSIS — J1282 Pneumonia due to coronavirus disease 2019: Secondary | ICD-10-CM | POA: Diagnosis not present

## 2022-02-28 DIAGNOSIS — U071 COVID-19: Secondary | ICD-10-CM | POA: Diagnosis not present

## 2022-02-28 DIAGNOSIS — M6282 Rhabdomyolysis: Secondary | ICD-10-CM

## 2022-02-28 LAB — BASIC METABOLIC PANEL
Anion gap: 10 (ref 5–15)
BUN: 30 mg/dL — ABNORMAL HIGH (ref 8–23)
CO2: 23 mmol/L (ref 22–32)
Calcium: 8.3 mg/dL — ABNORMAL LOW (ref 8.9–10.3)
Chloride: 107 mmol/L (ref 98–111)
Creatinine, Ser: 1.59 mg/dL — ABNORMAL HIGH (ref 0.61–1.24)
GFR, Estimated: 43 mL/min — ABNORMAL LOW (ref >60)
Glucose, Bld: 99 mg/dL (ref 70–99)
Potassium: 4.4 mmol/L (ref 3.5–5.1)
Sodium: 140 mmol/L (ref 135–145)

## 2022-02-28 LAB — CBC
HCT: 36.7 % — ABNORMAL LOW (ref 39.0–52.0)
Hemoglobin: 12.3 g/dL — ABNORMAL LOW (ref 13.0–17.0)
MCH: 31.2 pg (ref 26.0–34.0)
MCHC: 33.5 g/dL (ref 30.0–36.0)
MCV: 93.1 fL (ref 80.0–100.0)
Platelets: 125 10*3/uL — ABNORMAL LOW (ref 150–400)
RBC: 3.94 MIL/uL — ABNORMAL LOW (ref 4.22–5.81)
RDW: 13 % (ref 11.5–15.5)
WBC: 3.8 10*3/uL — ABNORMAL LOW (ref 4.0–10.5)
nRBC: 0 % (ref 0.0–0.2)

## 2022-02-28 LAB — MAGNESIUM: Magnesium: 1.9 mg/dL (ref 1.7–2.4)

## 2022-02-28 MED ORDER — FUROSEMIDE 20 MG PO TABS
20.0000 mg | ORAL_TABLET | Freq: Every day | ORAL | Status: AC
  Administered 2022-02-28: 20 mg via ORAL
  Filled 2022-02-28: qty 1

## 2022-02-28 MED ORDER — ALBUTEROL SULFATE HFA 108 (90 BASE) MCG/ACT IN AERS
2.0000 | INHALATION_SPRAY | Freq: Two times a day (BID) | RESPIRATORY_TRACT | Status: DC
  Administered 2022-02-28 – 2022-03-01 (×2): 2 via RESPIRATORY_TRACT
  Filled 2022-02-28: qty 6.7

## 2022-02-28 NOTE — Progress Notes (Signed)
Hospital day#0 Subjective:   Summary: Mr. Khalik Pewitt is an 81yo M with a PHMx significant for prior CVA, HFrEF, Afib on Eliquis, HLD, Seizures admitted for COVID PNA and acute on CHF  Overnight Events: None  Patient reports he feels well today and denies any respiratory symptoms. Does endorse some leg heaviness that corresponds with increased swelling. Denies chest pain or shortness of breath. Will discuss discharge plans with wife this morning.  Objective:  Vital signs in last 24 hours: Vitals:   02/28/22 0528 02/28/22 0748 02/28/22 0925 02/28/22 1021  BP: 124/89 127/71  122/76  Pulse: 79 72  85  Resp: '17 16  18  '$ Temp: 98 F (36.7 C) 97.9 F (36.6 C)  98 F (36.7 C)  TempSrc: Oral Oral  Oral  SpO2: 94% 94% 95% 93%  Weight: 92 kg     Height:       Supplemental O2: Room Air SpO2: 93 % Filed Weights   02/26/22 0344 02/26/22 0840 02/28/22 0528  Weight: 94 kg 90 kg 92 kg    Physical Exam:  Constitutional:Chronically ill-appearing man sitting in bed, in no acute distress. Requires two people to help sit up in bed HENT: normocephalic atraumatic, moist mucous membranes Neck: supple Cardiovascular: regular rate, regular rhythm, no murmurs Pulmonary/Chest: normal work of breathing on room air, lungs clear to auscultation bilaterally. No wheezing MSK: normal bulk and tone. 1+ pitting edema at bilateral lower extremities.  Neurological: alert & oriented x 3. Moves extremities spontaneously Skin: warm and dry Psych: Pleasant mood and affect    Intake/Output Summary (Last 24 hours) at 02/28/2022 1359 Last data filed at 02/28/2022 0531 Gross per 24 hour  Intake 118 ml  Output 1550 ml  Net -1432 ml   Net IO Since Admission: -399.89 mL [02/28/22 1359]   Pertinent Labs:    Latest Ref Rng & Units 02/28/2022   12:59 AM 02/27/2022    3:11 AM 02/26/2022    4:32 AM  CBC  WBC 4.0 - 10.5 K/uL 3.8  5.2  7.2   Hemoglobin 13.0 - 17.0 g/dL 12.3  11.8  12.3    Hematocrit 39.0 - 52.0 % 36.7  37.0  38.5   Platelets 150 - 400 K/uL 125  118  118        Latest Ref Rng & Units 02/28/2022   12:59 AM 02/27/2022    3:11 AM 02/26/2022    4:32 AM  CMP  Glucose 70 - 99 mg/dL 99  95  91   BUN 8 - 23 mg/dL 30  36  31   Creatinine 0.61 - 1.24 mg/dL 1.59  1.65  1.47   Sodium 135 - 145 mmol/L 140  139  140   Potassium 3.5 - 5.1 mmol/L 4.4  3.2  3.7   Chloride 98 - 111 mmol/L 107  105  109   CO2 22 - 32 mmol/L '23  26  24   '$ Calcium 8.9 - 10.3 mg/dL 8.3  8.3  8.4     Imaging: No results found.  Assessment/Plan:   Principal Problem:   Pneumonia due to COVID-19 virus Active Problems:   Acute on chronic heart failure [facility])   Patient Summary:  Mr. Hersel Mcmeen is an 81yo M with a PHMx significant for prior CVA, HFrEF, Afib on Eliquis, HLD, Seizures admitted for COVID PNA and acute on CHF, improving  Plan:  COVID  Possible PNA COVID positive at presentation. CXR and CTA with findings of RUL infiltrate  suggestive of PNA vs pulmonary edema. Continues without URI symptoms today but continues to endorse leg weakness. Afebrile and maintaining oxygen saturation on RA. Pulm exam  wnl. No leukocytosis. Decided to forego remdisivir given later in symptom course. Suspected initial RUL infiltrate asymmetric pulmonary edema vs pneumonia as lung exam improving now s/p 2x IV lasix. PT/OT recommend SNF and patient is going to discuss with wife.  -Continue to monitor respiratory status -Conservative measures for cough, URI symtoms -Continue isolation  Acute on chronic HF TTE with EF 45-50%, unchanged on repeat TTE on 12/26. Evidence of volume overload on presentation but has since improved now s/p 2x 20 mg IV lasix and one day of Lasix 20 mg po. Lungs CTAB, 1+ lower leg edema. Cr. 1.59 (1.65), close to baseline. Wt 92 kg (90 kg). 1.9L urine out. -Continue home Lasix 20 mg every other day with additional '20mg'$  dose today -Strict I/O and daily  weights  CKD3a Baseline Cr 1.5-1.8. Cr today 1.59. GFR 43. -Continue to monitor  Permanent Afib on Eliquis Prior CVA Rate controlled without medications.  -Continue Eliquis 2.5 mg BID   Seizures Followed by neurology.  No recent seizure episodes since being started on Keppra 750 mg daily -Continue Keppra 750 mg   Left MCA CVA HLD  - Continue Crestor 20 mg -Continue Zetia 10 mg   DM Not on medications. Last A1c 2022 6.2. -Continue monitoring blood glucose   Diet: Heart Healthy VTE: Eliquis Code: DNR PT/OT recs: Pending, other pending. Family Update:   Dispo: Anticipated discharge to  pending PT evaluation  in 1-2 days   Rick Duff, MD PGY-3 Internal Medicine  Pager [phone]

## 2022-02-28 NOTE — Discharge Summary (Signed)
Name: Edwin Wall MRN: [id number] DOB: September 19, 1940 81 y.o. PCP: Edwin Noon, MD  Date of Admission: 02/25/2022  8:15 AM Date of Discharge: 03/01/2022 Attending Physician: Edwin Mussel, MD  Discharge Diagnosis: 1. Principal Problem:   Pneumonia due to COVID-19 virus Active Problems:   Acute on chronic heart failure (HCC)   Non-traumatic rhabdomyolysis   Discharge Medications: Allergies as of 03/01/2022   No Known Allergies      Medication List     TAKE these medications    apixaban 2.5 MG Tabs tablet Commonly known as: ELIQUIS Take 1 tablet (2.5 mg total) by mouth 2 (two) times daily.   colestipol 1 g tablet Commonly known as: COLESTID Take 1 tablet (1 g total) by mouth 2 (two) times daily. What changed: when to take this   diclofenac Sodium 1 % Gel Commonly known as: VOLTAREN Apply 4 g topically 4 (four) times daily.   ezetimibe 10 MG tablet Commonly known as: ZETIA Take 1 tablet (10 mg total) by mouth daily.   furosemide 20 MG tablet Commonly known as: LASIX Take 1 tablet (20 mg total) by mouth every other day.   levETIRAcetam 750 MG tablet Commonly known as: Keppra Take 1 tablet (750 mg total) by mouth 2 (two) times daily.   liver oil-zinc oxide 40 % ointment Commonly known as: DESITIN Apply 1 application topically as needed for irritation.   NEO-SYNEPHRINE NA Place 1 spray into the nose daily as needed (congestion).   neomycin-bacitracin-polymyxin [id number] ointment Commonly known as: NEOSPORIN Apply 1 application. topically as needed for wound care.   potassium chloride 10 MEQ tablet Commonly known as: KLOR-CON M Take 10 mEq by mouth daily.   rosuvastatin 40 MG tablet Commonly known as: CRESTOR Take 1 tablet (40 mg total) by mouth daily.          Disposition and follow-up:   Edwin Wall was discharged from Edwin Wall in Stable condition.  At the hospital follow up visit please  address:  1.  Covid infection: Patient's symptoms resolved with supportive care though he seems quite deconditioned. Able to ambulate around room and transfer to chair with minimal assistance by day of discharge. Discussed recommendation for SNF but patient adamant about going home. Referral for outpatient PT has been placed per patient's preference.   Acute on chronic HF: Patient still with trace lower extremity edema. Will discharge on home regimen Lasix 20 mg every other day. Please follow up volume status and optimize medical therapy accordingly.  2.  Labs / imaging needed at time of follow-up: BMP per provider discretion  3.  Pending labs/ test needing follow-up: none  Follow-up Appointments:  Follow-up Information     Edwin Wall Follow up.   Specialty: Rehabilitation Why: please call them if you have not heard from them in 2 to 3 business days Contact information: Edwin Wall by problem list: 1. Covid infection, AoCHF: Presented with chief complaint of fall, weakness and decreased mobility. COVID positive at presentation. CT head and neck without acute process. CXR and CTA with findings of RUL infiltrate suggestive of PNA vs pulmonary edema. Remained afebrile and maintaining oxygen saturation on RA. Without leukocytosis. Decided to forego remdisivir given later in symptom course. Suspected initial RUL infiltrate asymmetric pulmonary edema. Prior TTE with EF 45-50%, unchanged on repeat TTE  on 12/26. Evidence of volume overload on presentation but has since improved with IV lasix. Day of discharge, Cr. 1.59, close to baseline. Transitioned to home Lasix 20 mg every other day at discharge. Received physical therapy day of discharge and able to ambulate in room and transfer to chair with minimal assistance. Insists on going home despite  SNF recommendation. Will hopefull begin with outpatient physical therapy early this week.  Discharge Exam:   BP 108/78   Pulse 75   Temp 97.7 F (36.5 C) (Oral)   Resp 20   Ht 5' 8.5" (1.74 m)   Wt 92.1 kg   SpO2 97%   BMI 30.42 kg/m  Discharge exam:   Physical Exam Constitutional:      General: He is not in acute distress. HENT:     Head: Normocephalic and atraumatic.  Eyes:     Extraocular Movements: Extraocular movements intact.  Cardiovascular:     Rate and Rhythm: Normal rate. Rhythm irregular.     Heart sounds: No murmur heard. Pulmonary:     Effort: Pulmonary effort is normal.     Breath sounds: Normal breath sounds. No wheezing or rales.  Musculoskeletal:     Cervical back: Neck supple.     Comments: Trace pitting edema at bilateral lower extremities  Skin:    General: Skin is warm and dry.  Neurological:     Mental Status: He is alert and oriented to person, place, and time.  Psychiatric:        Mood and Affect: Mood normal.        Behavior: Behavior normal.      Pertinent Labs, Studies, and Procedures:  ECHOCARDIOGRAM COMPLETE  Result Date: 02/25/2022    ECHOCARDIOGRAM REPORT   Patient Name:   Edwin Wall Date of Exam: 02/25/2022 Medical Rec #:  [id number]            Height:       68.5 in Accession #:    [id number]           Weight:       203.0 lb Date of Birth:  Feb 03, 1941           BSA:          2.068 m Patient Age:    54 years             BP:           135/90 mmHg Patient Gender: M                    HR:           95 bpm. Exam Location:  Inpatient Procedure: 2D Echo Indications:    acute systolic chf  History:        Patient has prior history of Echocardiogram examinations, most                 recent 12/24/2020. Arrythmias:Atrial Fibrillation,                 Signs/Symptoms:Edema; Risk Factors:Dyslipidemia, Hypertension                 and Diabetes.  Sonographer:    Edwin Wall RDCS Referring Phys: [id number] Edwin Wall IMPRESSIONS  1.  Abnoraml septal motion inferior basa hypokinesis . Left ventricular ejection fraction, by estimation, is 45 to 50%. The left ventricle has mildly decreased function. The left ventricle has no regional wall motion abnormalities. Left ventricular diastolic parameters are indeterminate.  2. Right ventricular systolic function is normal. The right ventricular size is normal.  3. Left atrial size was mildly dilated.  4. The mitral valve is abnormal. Mild mitral valve regurgitation. No evidence of mitral stenosis.  5. The aortic valve is tricuspid. Aortic valve regurgitation is not visualized. No aortic stenosis is present.  6. The inferior vena cava is normal in size with greater than 50% respiratory variability, suggesting right atrial pressure of 3 mmHg. FINDINGS  Left Ventricle: Abnoraml septal motion inferior basa hypokinesis. Left ventricular ejection fraction, by estimation, is 45 to 50%. The left ventricle has mildly decreased function. The left ventricle has no regional wall motion abnormalities. The left ventricular internal cavity size was normal in size. There is no left ventricular hypertrophy. Left ventricular diastolic parameters are indeterminate. Right Ventricle: The right ventricular size is normal. No increase in right ventricular wall thickness. Right ventricular systolic function is normal. Left Atrium: Left atrial size was mildly dilated. Right Atrium: Right atrial size was normal in size. Pericardium: There is no evidence of pericardial effusion. Mitral Valve: The mitral valve is abnormal. There is mild thickening of the mitral valve leaflet(s). Mild mitral valve regurgitation. No evidence of mitral valve stenosis. Tricuspid Valve: The tricuspid valve is normal in structure. Tricuspid valve regurgitation is mild . No evidence of tricuspid stenosis. Aortic Valve: The aortic valve is tricuspid. Aortic valve regurgitation is not visualized. No aortic stenosis is present. Pulmonic Valve: The pulmonic  valve was normal in structure. Pulmonic valve regurgitation is not visualized. No evidence of pulmonic stenosis. Aorta: The aortic root is normal in size and structure. Venous: The inferior vena cava is normal in size with greater than 50% respiratory variability, suggesting right atrial pressure of 3 mmHg. IAS/Shunts: No atrial level shunt detected by color flow Doppler.  LEFT VENTRICLE PLAX 2D LVIDd:         4.70 cm LVIDs:         3.90 cm LV PW:         1.10 cm LV IVS:        0.90 cm LVOT diam:     2.20 cm LVOT Area:     3.80 cm  RIGHT VENTRICLE         IVC TAPSE (M-mode): 1.2 cm  IVC diam: 1.90 cm LEFT ATRIUM             Index        RIGHT ATRIUM           Index LA diam:        4.00 cm 1.93 cm/m   RA Area:     16.00 cm LA Vol (A2C):   71.9 ml 34.77 ml/m  RA Volume:   42.60 ml  20.60 ml/m LA Vol (A4C):   60.7 ml 29.35 ml/m LA Biplane Vol: 69.3 ml 33.51 ml/m   AORTA Ao Root diam: 3.50 cm Ao Asc diam:  3.50 cm  SHUNTS Systemic Diam: 2.20 cm Jenkins Rouge MD Electronically signed by Jenkins Rouge MD Signature Date/Time: 02/25/2022/6:01:06 PM    Final    CT Angio Chest PE W and/or Wo Contrast  Result Date: 02/25/2022 CLINICAL DATA:  Pulmonary embolus EXAM: CT ANGIOGRAPHY CHEST WITH CONTRAST TECHNIQUE: Multidetector CT imaging of the chest was performed using the standard protocol during bolus administration of intravenous contrast. Multiplanar CT image reconstructions and MIPs were obtained to evaluate the vascular anatomy. RADIATION DOSE REDUCTION: This exam was performed according to the departmental dose-optimization program which includes automated  exposure control, adjustment of the mA and/or kV according to patient size and/or use of iterative reconstruction technique. CONTRAST:  60m OMNIPAQUE IOHEXOL 350 MG/ML SOLN COMPARISON:  None Available. FINDINGS: Cardiovascular: No evidence of pulmonary embolus. Normal heart size. No pericardial effusion. Normal caliber thoracic aorta with mild calcified  plaque. Mediastinum/Nodes: Enlarged mediastinal lymph nodes are likely reactive, reference subcarinal lymph node measuring 1.7 cm in short axis on series 8, image 71. Esophagus and thyroid are unremarkable. Lungs/Pleura: Central airways are patent. Upper lobe predominant patchy consolidations of the right lung. Small right-greater-than-left pleural effusions with associated atelectasis. Upper Abdomen: No acute abnormality. Musculoskeletal: No chest wall abnormality. No acute or significant osseous findings. Review of the MIP images confirms the above findings. IMPRESSION: 1. No evidence of pulmonary embolus. 2. Upper lobe predominant patchy consolidations of the right lung, likely due to asymmetric pulmonary edema, possibly related to mitral valve dysfunction. Infection is an additional consideration. 3. Small right-greater-than-left pleural effusions with associated atelectasis. 4. Enlarged mediastinal lymph nodes, favored to be reactive, although given size, follow-up CT is recommended in 3 months to ensure resolution. 5. Aortic Atherosclerosis (ICD10-I70.0). Electronically Signed   By: LYetta GlassmanM.D.   On: 02/25/2022 13:10   CT Head Wo Contrast  Result Date: 02/25/2022 CLINICAL DATA:  Blunt poly trauma EXAM: CT HEAD WITHOUT CONTRAST CT CERVICAL SPINE WITHOUT CONTRAST TECHNIQUE: Multidetector CT imaging of the head and cervical spine was performed following the standard protocol without intravenous contrast. Multiplanar CT image reconstructions of the cervical spine were also generated. RADIATION DOSE REDUCTION: This exam was performed according to the departmental dose-optimization program which includes automated exposure control, adjustment of the mA and/or kV according to patient size and/or use of iterative reconstruction technique. COMPARISON:  07/27/2021 FINDINGS: CT HEAD FINDINGS Brain: No evidence of acute infarction, hemorrhage, extra-axial collection, ventriculomegaly, or mass effect. 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 Generalized cerebral atrophy. Periventricular Constantine Ruddick matter low attenuation likely secondary to microangiopathy. Vascular: Cerebrovascular atherosclerotic calcifications are noted. No hyperdense vessels. Skull: Negative for fracture or focal lesion. Sinuses/Orbits: Visualized portions of the orbits are unremarkable. Visualized portions of the paranasal sinuses are unremarkable. Visualized portions of the mastoid air cells are unremarkable. Other: None. CT CERVICAL SPINE FINDINGS Alignment: Normal. Skull base and vertebrae: No acute fracture. No primary bone lesion or focal pathologic process. Soft tissues and spinal canal: No prevertebral fluid or swelling. No visible canal hematoma. Disc levels: Ankylosis across the C3-4 disc space. Degenerative disease with disc height loss at C4-5, C5-6, C6-7 and T1-2. At C2-3 there is moderate right facet arthropathy, moderate left facet arthropathy. No foraminal stenosis at C2-3. At C3-4 there is a broad-based disc osteophyte complex, bilateral uncovertebral degenerative changes, moderate right foraminal stenosis, and no left foraminal stenosis. At C4-5 there is a broad-based disc osteophyte complex, bilateral uncovertebral degenerative changes and mild right foraminal stenosis. Mild left facet arthropathy at C4-5. At C5-6 there is a broad-based disc osteophyte complex with bilateral uncovertebral degenerative changes, severe right foraminal stenosis, and mild left foraminal stenosis. At C6-7 there is a broad-based disc osteophyte complex impressing upon the thecal sac, bilateral uncovertebral degenerative changes, severe right foraminal stenosis, and moderate-severe left foraminal stenosis. At C7-T1 there is severe right and mild left facet arthropathy. At T1-2 there is a broad-based disc osteophyte complex, and moderate bilateral foraminal stenosis. Partial ankylosis across the T1-2 disc space. Upper chest:  Patchy right apical airspace disease partially visualized concerning for pneumonia. Other: No fluid  collection or hematoma. IMPRESSION: 1. No acute intracranial pathology. 2. No acute osseous injury of the cervical spine. 3. Cervical spine spondylosis as described above. 4. Patchy right apical airspace disease partially visualized concerning for pneumonia. Electronically Signed   By: Kathreen Devoid M.D.   On: 02/25/2022 09:42   CT Cervical Spine Wo Contrast  Result Date: 02/25/2022 CLINICAL DATA:  Blunt poly trauma EXAM: CT HEAD WITHOUT CONTRAST CT CERVICAL SPINE WITHOUT CONTRAST TECHNIQUE: Multidetector CT imaging of the head and cervical spine was performed following the standard protocol without intravenous contrast. Multiplanar CT image reconstructions of the cervical spine were also generated. RADIATION DOSE REDUCTION: This exam was performed according to the departmental dose-optimization program which includes automated exposure control, adjustment of the mA and/or kV according to patient size and/or use of iterative reconstruction technique. COMPARISON:  07/27/2021 FINDINGS: CT HEAD FINDINGS Brain: No evidence of acute infarction, hemorrhage, extra-axial collection, ventriculomegaly, or mass effect. 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 Generalized cerebral atrophy. Periventricular Gayla Benn matter low attenuation likely secondary to microangiopathy. Vascular: Cerebrovascular atherosclerotic calcifications are noted. No hyperdense vessels. Skull: Negative for fracture or focal lesion. Sinuses/Orbits: Visualized portions of the orbits are unremarkable. Visualized portions of the paranasal sinuses are unremarkable. Visualized portions of the mastoid air cells are unremarkable. Other: None. CT CERVICAL SPINE FINDINGS Alignment: Normal. Skull base and vertebrae: No acute fracture. No primary bone lesion or focal pathologic process. Soft tissues and spinal canal: No  prevertebral fluid or swelling. No visible canal hematoma. Disc levels: Ankylosis across the C3-4 disc space. Degenerative disease with disc height loss at C4-5, C5-6, C6-7 and T1-2. At C2-3 there is moderate right facet arthropathy, moderate left facet arthropathy. No foraminal stenosis at C2-3. At C3-4 there is a broad-based disc osteophyte complex, bilateral uncovertebral degenerative changes, moderate right foraminal stenosis, and no left foraminal stenosis. At C4-5 there is a broad-based disc osteophyte complex, bilateral uncovertebral degenerative changes and mild right foraminal stenosis. Mild left facet arthropathy at C4-5. At C5-6 there is a broad-based disc osteophyte complex with bilateral uncovertebral degenerative changes, severe right foraminal stenosis, and mild left foraminal stenosis. At C6-7 there is a broad-based disc osteophyte complex impressing upon the thecal sac, bilateral uncovertebral degenerative changes, severe right foraminal stenosis, and moderate-severe left foraminal stenosis. At C7-T1 there is severe right and mild left facet arthropathy. At T1-2 there is a broad-based disc osteophyte complex, and moderate bilateral foraminal stenosis. Partial ankylosis across the T1-2 disc space. Upper chest: Patchy right apical airspace disease partially visualized concerning for pneumonia. Other: No fluid collection or hematoma. IMPRESSION: 1. No acute intracranial pathology. 2. No acute osseous injury of the cervical spine. 3. Cervical spine spondylosis as described above. 4. Patchy right apical airspace disease partially visualized concerning for pneumonia. Electronically Signed   By: Kathreen Devoid M.D.   On: 02/25/2022 09:42   DG Forearm Right  Result Date: 02/25/2022 CLINICAL DATA:  Fall EXAM: RIGHT FOREARM - 2 VIEW COMPARISON:  None Available. FINDINGS: There is no evidence of fracture or other focal bone lesions. Soft tissues are unremarkable. IMPRESSION: Negative. Electronically Signed    By: Yetta Glassman M.D.   On: 02/25/2022 09:09   DG Chest Portable 1 View  Result Date: 02/25/2022 CLINICAL DATA:  Cough, weakness. EXAM: PORTABLE CHEST 1 VIEW COMPARISON:  Jul 27, 2021. FINDINGS: Stable cardiomegaly. Left lung is clear. Increased right upper lobe opacity is noted which may represent pneumonia, although nodular opacity is noted in right upper  lobe. Bony thorax is unremarkable. IMPRESSION: Increased right upper lobe opacity is noted which may represent pneumonia, although nodular opacity is noted in right upper lobe concerning for possible mass or neoplasm. CT scan is recommended for further evaluation. Electronically Signed   By: Marijo Conception M.D.   On: 02/25/2022 09:01     Discharge Instructions: Discharge Instructions     Diet - low sodium heart healthy   Complete by: As directed    Discharge instructions   Complete by: As directed    Mr. Burdette,  It was a pleasure caring for you during your admission here at [facility]. You came in with a Covid infection and a heart failure exacerbation. Your symptoms have greatly improved with medication. However, you have experienced some deconditioning from your infection and immobility. It is important that you continue with physical therapy. -please schedule an appointment with your primary care physician in the next week or so for a hospital follow up -outpatient physical therapy should be reaching out to you to schedule therapy to hopefully begin Tuesday   Increase activity slowly   Complete by: As directed        Signed: Linward Natal, MD 03/01/2022, 4:36 PM   Pager: [phone]

## 2022-02-28 NOTE — TOC Transition Note (Signed)
Transition of Care [facility]) - CM/SW Discharge Note   Patient Details  Name: ENIS RIECKE MRN: [id number] Date of Birth: Aug 05, 1940  [facility] CM/SW Contact:  Zenon Mayo, RN Phone Number: 02/28/2022, 3:26 PM   Clinical Narrative:    NCM spoke with wife, she states they want patient to do outpatient physical therapy at [facility] on [address]  NCM made referral for outpatient pt thru epic for patient, on AVS.    Final next level of care: OP Rehab Barriers to Discharge: Continued Medical Work up   Patient Goals and CMS Choice      Discharge Placement                         Discharge Plan and Services Additional resources added to the After Visit Summary for                  DME Arranged: [name]                    Social Determinants of Health (SDOH) Interventions SDOH Screenings   Food Insecurity: No Food Insecurity (02/26/2022)  Housing: Low Risk  (02/26/2022)  Transportation Needs: No Transportation Needs (02/26/2022)  Utilities: Not At Risk (02/26/2022)  Depression (PHQ2-9): Low Risk  (09/10/2020)  Tobacco Use: Low Risk  (02/26/2022)     Readmission Risk Interventions     No data to display

## 2022-02-28 NOTE — Progress Notes (Signed)
Physical Therapy Treatment Patient Details Name: Edwin Wall MRN: [id number] DOB: 1940-08-15 Today's Date: 02/28/2022   History of Present Illness Pt is an 81 y/o male who presented after a fall at home where family was unable to assist him up so pt remained on floor >3 days. Negative for acute fx. Found to be COVID+. PMH: CVA, HFeEF, a fib, HLD, seizures.    PT Comments    Pt is up to side of bed with mod max assist, and then up to stand mod assist.  Pt is requiring dense repetitive cues for sequence and safety, and required repeated discussion about his reasons for  needing help at home.  Pt is now unable to count on family as wife and son were exposed to Covid and now sick.   He is agreeing to go to SNF, which is recommended by PT for recovery of strength, endurance, balance and sequence of mobility.  Will ask for him to continue PT acutely for goals as are outlined on POC.  Recommendations for follow up therapy are one component of a multi-disciplinary discharge planning process, led by the attending physician.  Recommendations may be updated based on patient status, additional functional criteria and insurance authorization.  Follow Up Recommendations  Skilled nursing-short term rehab (<3 hours/day) Can patient physically be transported by private vehicle: No   Assistance Recommended at Discharge Frequent or constant Supervision/Assistance  Patient can return home with the following A lot of help with walking and/or transfers;A lot of help with bathing/dressing/bathroom;Assistance with cooking/housework;Direct supervision/assist for medications management;Direct supervision/assist for financial management;Assist for transportation;Help with stairs or ramp for entrance   Equipment Recommendations  None recommended by PT    Recommendations for Other Services       Precautions / Restrictions Precautions Precautions: Fall;Other (comment) Precaution Comments: monitor  orthostatics, HR Restrictions Weight Bearing Restrictions: No     Mobility  Bed Mobility Overal bed mobility: Needs Assistance Bed Mobility: Supine to Sit     Supine to sit: Max assist     General bed mobility comments: RLE edema, required help with RLE and trunk to scoot out    Transfers Overall transfer level: Needs assistance Equipment used: Rolling walker (2 wheels) Transfers: Sit to/from Stand, Bed to chair/wheelchair/BSC Sit to Stand: Mod assist   Step pivot transfers: Min assist       General transfer comment: pt is requiring step by step instructions then can help    Ambulation/Gait               General Gait Details: pt steps were a part of transfers and not functional gait   Stairs             Wheelchair Mobility    Modified Rankin (Stroke Patients Only)       Balance Overall balance assessment: Needs assistance Sitting-balance support: Feet supported Sitting balance-Leahy Scale: Fair     Standing balance support: Bilateral upper extremity supported, During functional activity Standing balance-Leahy Scale: Poor                              Cognition Arousal/Alertness: Awake/alert Behavior During Therapy: Flat affect, WFL for tasks assessed/performed Overall Cognitive Status: Impaired/Different from baseline Area of Impairment: Problem solving, Awareness, Safety/judgement, Following commands, Attention                   Current Attention Level: Selective Memory: Decreased recall of precautions, Decreased short-term memory  Following Commands: Follows one step commands with increased time, Follows one step commands inconsistently Safety/Judgement: Decreased awareness of deficits, Decreased awareness of safety Awareness: Intellectual Problem Solving: Slow processing, Requires verbal cues, Requires tactile cues General Comments: pt is more aware of mobility with instructions about helping him        Exercises       General Comments General comments (skin integrity, edema, etc.): Pt is demonstrating better standing control but continues to require cues and direction for entire session to move      Pertinent Vitals/Pain Pain Assessment Pain Assessment: No/denies pain    Home Living                          Prior Function            PT Goals (current goals can now be found in the care plan section) Progress towards PT goals: Progressing toward goals    Frequency    Min 2X/week      PT Plan Frequency needs to be updated    Co-evaluation              AM-PAC PT "6 Clicks" Mobility   Outcome Measure  Help needed turning from your back to your side while in a flat bed without using bedrails?: A Lot Help needed moving from lying on your back to sitting on the side of a flat bed without using bedrails?: A Lot Help needed moving to and from a bed to a chair (including a wheelchair)?: A Lot Help needed standing up from a chair using your arms (e.g., wheelchair or bedside chair)?: A Lot Help needed to walk in hospital room?: Total Help needed climbing 3-5 steps with a railing? : Total 6 Click Score: 10    End of Session Equipment Utilized During Treatment: Gait belt Activity Tolerance: Patient tolerated treatment well Patient left: in chair;with call bell/phone within reach;with chair alarm set Nurse Communication: Mobility status;Other (comment) PT Visit Diagnosis: Unsteadiness on feet (R26.81);Muscle weakness (generalized) (M62.81);Difficulty in walking, not elsewhere classified (R26.2)     Time: 3007-6226 PT Time Calculation (min) (ACUTE ONLY): 32 min  Charges:  $Therapeutic Activity: 23-37 mins     Ramond Dial 02/28/2022, 2:38 PM  Mee Hives, PT PhD Acute Rehab Dept. Number: [place] [facility] [place]

## 2022-03-01 DIAGNOSIS — U071 COVID-19: Secondary | ICD-10-CM | POA: Diagnosis not present

## 2022-03-01 LAB — MAGNESIUM: Magnesium: 1.9 mg/dL (ref 1.7–2.4)

## 2022-03-01 MED ORDER — ALBUTEROL SULFATE HFA 108 (90 BASE) MCG/ACT IN AERS: 2.0000 | INHALATION_SPRAY | Freq: Four times a day (QID) | RESPIRATORY_TRACT | Status: DC | PRN

## 2022-03-01 NOTE — Progress Notes (Signed)
Hospital day#0 Subjective:   Summary: Mr. Marsden Zaino is an 81yo M with a PHMx significant for prior CVA, HFrEF, Afib on Eliquis, HLD, Seizures admitted for COVID PNA and acute on CHF  Overnight Events: None  Continues to deny any symptoms of covid including cough, headache, sort throat, congestion, SOB. Reports his leg heaviness has improved. States he was able to move from bed to chair today unassisted. States he will discuss discharge plan with his wife this afternoon.  Objective:  Vital signs in last 24 hours: Vitals:   02/28/22 1936 03/01/22 0400 03/01/22 0911 03/01/22 1200  BP: 125/82 116/74  108/78  Pulse: 71 66 84 75  Resp: '17 17 16 20  '$ Temp: 98.2 F (36.8 C) 97.9 F (36.6 C)  97.7 F (36.5 C)  TempSrc: Oral Oral  Oral  SpO2: 98% 95% 99% 97%  Weight:  92.1 kg    Height:       Supplemental O2: Room Air SpO2: 97 % Filed Weights   02/26/22 0840 02/28/22 0528 03/01/22 0400  Weight: 90 kg 92 kg 92.1 kg    Physical Exam:  Constitutional: Chronically ill-appearing man sitting in chair, in no acute distress.  HENT: normocephalic atraumatic, moist mucous membranes Neck: supple Cardiovascular: regular rate, regular rhythm, no murmurs Pulmonary/Chest: normal work of breathing on room air. MSK: normal bulk and tone. Trace pitting edema at bilateral lower extremities.  Neurological: alert & oriented x 3. Moves extremities spontaneously Skin: warm and dry Psych: Pleasant mood and affect    Intake/Output Summary (Last 24 hours) at 03/01/2022 1322 Last data filed at 03/01/2022 0758 Gross per 24 hour  Intake 597 ml  Output 1300 ml  Net -703 ml   Net IO Since Admission: -1,102.89 mL [03/01/22 1322]   Pertinent Labs:    Latest Ref Rng & Units 02/28/2022   12:59 AM 02/27/2022    3:11 AM 02/26/2022    4:32 AM  CBC  WBC 4.0 - 10.5 K/uL 3.8  5.2  7.2   Hemoglobin 13.0 - 17.0 g/dL 12.3  11.8  12.3   Hematocrit 39.0 - 52.0 % 36.7  37.0  38.5   Platelets  150 - 400 K/uL 125  118  118        Latest Ref Rng & Units 02/28/2022   12:59 AM 02/27/2022    3:11 AM 02/26/2022    4:32 AM  CMP  Glucose 70 - 99 mg/dL 99  95  91   BUN 8 - 23 mg/dL 30  36  31   Creatinine 0.61 - 1.24 mg/dL 1.59  1.65  1.47   Sodium 135 - 145 mmol/L 140  139  140   Potassium 3.5 - 5.1 mmol/L 4.4  3.2  3.7   Chloride 98 - 111 mmol/L 107  105  109   CO2 22 - 32 mmol/L '23  26  24   '$ Calcium 8.9 - 10.3 mg/dL 8.3  8.3  8.4     Imaging: No results found.  Assessment/Plan:   Principal Problem:   Pneumonia due to COVID-19 virus Active Problems:   Acute on chronic heart failure (HCC)   Non-traumatic rhabdomyolysis   Patient Summary:  Mr. Zyshonne Malecha is an 81yo M with a PHMx significant for prior CVA, HFrEF, Afib on Eliquis, HLD, Seizures admitted for COVID PNA and acute on CHF which is improving.  Plan:  COVID  Possible PNA COVID positive at presentation. CXR and CTA with findings of RUL infiltrate suggestive  of PNA vs pulmonary edema. Continues without URI symptoms today but continues to endorse leg weakness. Afebrile and maintaining oxygen saturation on RA. Pulm exam  wnl. No leukocytosis. Decided to forego remdisivir given later in symptom course. Suspected initial RUL infiltrate asymmetric pulmonary edema vs pneumonia as lung exam improving now s/p 2x IV lasix. PT/OT recommend SNF. Patient and wife preferred to go home instead of to SNF. Patient not felt safe for discharge given profound weakness and lack of adequate home care. Will discuss with physical therapy today and if no progress has been made, will discuss SNF with patient and family again. Otherwise, patient will need to discharge as he is medically stable and move forward with outpatient PT. -Continue to monitor respiratory status -Conservative measures for cough, URI symtoms -Continue isolation  Acute on chronic HF TTE with EF 45-50%, unchanged on repeat TTE on 12/26. Evidence of volume  overload on presentation but has since improved now s/p 2x 20 mg IV lasix and currently on every other day Lasix 20 mg po. Trace lower leg edema. Denies SOB, sating well on RA. 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  Permanent Afib on Eliquis Prior CVA Rate controlled without medications.  -Continue Eliquis 2.5 mg BID   Seizures Followed by neurology.  No recent seizure episodes since being started on Keppra 750 mg daily -Continue Keppra 750 mg   Left MCA CVA HLD  - Continue Crestor 20 mg -Continue Zetia 10 mg   DM Not on medications. Last A1c 2022 6.2. -Continue monitoring blood glucose   Diet: Heart Healthy VTE: Eliquis Code: DNR PT/OT recs: Pending, other pending. Family Update:   Dispo: Anticipated discharge to  pending PT evaluation  in 1-2 days   Linward Natal, PGY-1 Internal Medicine  Pager [phone]

## 2022-03-01 NOTE — Progress Notes (Signed)
Physical Therapy Treatment Patient Details Name: Edwin Wall MRN: [id number] DOB: 24-Jun-1940 Today's Date: 03/01/2022   History of Present Illness Pt is an 81 y/o male who presented after a fall at home where family was unable to assist him up so pt remained on floor >3 days. Negative for acute fx. Found to be COVID+. PMH: CVA, HFeEF, a fib, HLD, seizures.    PT Comments    Making notable progress towards acute care goals. Ambulates in room with min guard assist on room air, briefly SpO2 88% but difficult interpret accuracy of waveform, majority of session pt in mid to high 90s on room air. Pt has bowel and bladder incontinence while ambulating. BP a little soft but denies dizziness at 91/67. HR fluctuating up to 120s with gait, 100s at rest. Pt wife present during session. They are adamant about patient returning home rather than SNF. They feel there will be adequate support at home. I expressed my concerns that this will likely place a heavy burden on the family to care for patient and short term rehab at a SNF could hasten his recovery towards functional independence . They verbalize understanding but would like to go home as soon as possible. We will continue to follow and work with Mr. Lumpkin until d/c. Patient will continue to benefit from skilled physical therapy services to further improve independence with functional mobility.    Recommendations for follow up therapy are one component of a multi-disciplinary discharge planning process, led by the attending physician.  Recommendations may be updated based on patient status, additional functional criteria and insurance authorization.  Follow Up Recommendations  Skilled nursing-short term rehab (<3 hours/day) (Pt and Family refusing - would order HHPT for d/c) Can patient physically be transported by private vehicle: Yes   Assistance Recommended at Discharge Frequent or constant Supervision/Assistance  Patient can return home  with the following Assistance with cooking/housework;Direct supervision/assist for medications management;Direct supervision/assist for financial management;Assist for transportation;Help with stairs or ramp for entrance;A little help with walking and/or transfers;A little help with bathing/dressing/bathroom   Equipment Recommendations  None recommended by PT    Recommendations for Other Services       Precautions / Restrictions Precautions Precautions: Fall;Other (comment) Precaution Comments: monitor orthostatics, HR Restrictions Weight Bearing Restrictions: No     Mobility  Bed Mobility Overal bed mobility: Needs Assistance Bed Mobility: Sit to Supine       Sit to supine: Min guard   General bed mobility comments: Very effortful however pt able to perform without physical assist. Cues throughout.    Transfers Overall transfer level: Needs assistance Equipment used: Rolling walker (2 wheels) Transfers: Sit to/from Stand Sit to Stand: Min guard   Step pivot transfers: Min guard       General transfer comment: Close min guard, very slow to rise. Practiced from recliner and chair, each without physical assist but cues for technique and close guard for safety.    Ambulation/Gait Ambulation/Gait assistance: Min guard Gait Distance (Feet): 55 Feet Assistive device: Rolling walker (2 wheels) Gait Pattern/deviations: Step-through pattern, Decreased stride length, Shuffle, Trunk flexed Gait velocity: slow Gait velocity interpretation: <1.31 ft/sec, indicative of household ambulator   General Gait Details: Educated on RW use, some difficulty navigating through congested areas. No episodes of buckling or overt LOB while using RW for support. SpO2 88% on RA at lowest, increased with standing rest break back to mid 90s, 2/4 DOE. Pt had episode of bowel and bladder incontinence while ambulating.  Stairs             Wheelchair Mobility    Modified Rankin (Stroke  Patients Only)       Balance Overall balance assessment: Needs assistance Sitting-balance support: Feet supported Sitting balance-Leahy Scale: Fair     Standing balance support: During functional activity, No upper extremity supported Standing balance-Leahy Scale: Fair Standing balance comment: during pericare                            Cognition Arousal/Alertness: Awake/alert Behavior During Therapy: WFL for tasks assessed/performed Overall Cognitive Status: Impaired/Different from baseline Area of Impairment: Problem solving, Awareness, Safety/judgement, Following commands, Attention                   Current Attention Level: Selective   Following Commands: Follows one step commands with increased time Safety/Judgement: Decreased awareness of deficits, Decreased awareness of safety Awareness: Emergent Problem Solving: Slow processing, Requires verbal cues          Exercises      General Comments General comments (skin integrity, edema, etc.): SpO2 99% on room air at rest. HR up to 120s while ambulating. SpO2 briefly 88% on Room air while ambualting, but improved to mid 90s with standing rest, (question reading accuracy.)      Pertinent Vitals/Pain Pain Assessment Pain Assessment: Faces Faces Pain Scale: Hurts even more Pain Location: low back Pain Descriptors / Indicators: Aching Pain Intervention(s): Monitored during session, Repositioned    Home Living                          Prior Function            PT Goals (current goals can now be found in the care plan section) Acute Rehab PT Goals PT Goal Formulation: With patient Time For Goal Achievement: 03/12/22 Potential to Achieve Goals: Good Progress towards PT goals: Progressing toward goals    Frequency    Min 2X/week      PT Plan Current plan remains appropriate    Co-evaluation              AM-PAC PT "6 Clicks" Mobility   Outcome Measure  Help needed  turning from your back to your side while in a flat bed without using bedrails?: A Little Help needed moving from lying on your back to sitting on the side of a flat bed without using bedrails?: A Little Help needed moving to and from a bed to a chair (including a wheelchair)?: A Little Help needed standing up from a chair using your arms (e.g., wheelchair or bedside chair)?: A Little Help needed to walk in hospital room?: A Little Help needed climbing 3-5 steps with a railing? : A Lot 6 Click Score: 17    End of Session Equipment Utilized During Treatment: Gait belt Activity Tolerance: Patient tolerated treatment well Patient left: in bed;with call bell/phone within reach;with bed alarm set;with family/visitor present   PT Visit Diagnosis: Unsteadiness on feet (R26.81);Muscle weakness (generalized) (M62.81);Difficulty in walking, not elsewhere classified (R26.2)     Time: 1610-9604 PT Time Calculation (min) (ACUTE ONLY): 52 min  Charges:  $Gait Training: 8-22 mins $Therapeutic Activity: 8-22 mins $Self Care/Home Management: 8-22                     Candie Mile, PT, DPT Physical Therapist Acute Rehabilitation Services [place]  [place] 03/01/2022, 3:05 PM

## 2022-03-01 NOTE — Progress Notes (Signed)
Occupational Therapy Treatment Patient Details Name: Edwin Wall MRN: [id number] DOB: 1940/09/24 Today's Date: 03/01/2022   History of present illness Pt is an 81 y/o male who presented after a fall at home where family was unable to assist him up so pt remained on floor >3 days. Negative for acute fx. Found to be COVID+. PMH: CVA, HFeEF, a fib, HLD, seizures.   OT comments  Pt progressing towards goals this session,needing min guard-min A for ADLs, min guard for bed mobility and min A for transfers with RW. VSS on RA throughout session. Pt presenting with impairments listed below, will follow acutely. Continue to recommend SNF at d/c/    Recommendations for follow up therapy are one component of a multi-disciplinary discharge planning process, led by the attending physician.  Recommendations may be updated based on patient status, additional functional criteria and insurance authorization.    Follow Up Recommendations  Skilled nursing-short term rehab (<3 hours/day)    Assistance Recommended at Discharge Frequent or constant Supervision/Assistance  Patient can return home with the following  A little help with walking and/or transfers;A lot of help with bathing/dressing/bathroom   Equipment Recommendations  None recommended by OT (pt has all needed DME)    Recommendations for Other Services PT consult    Precautions / Restrictions Precautions Precautions: Fall;Other (comment) Precaution Comments: monitor orthostatics, HR Restrictions Weight Bearing Restrictions: No       Mobility Bed Mobility Overal bed mobility: Needs Assistance Bed Mobility: Supine to Sit     Supine to sit: Min guard          Transfers Overall transfer level: Needs assistance Equipment used: Rolling walker (2 wheels) Transfers: Sit to/from Stand, Bed to chair/wheelchair/BSC Sit to Stand: Min assist                 Balance Overall balance assessment: Needs  assistance Sitting-balance support: Feet supported Sitting balance-Leahy Scale: Fair     Standing balance support: Bilateral upper extremity supported, During functional activity Standing balance-Leahy Scale: Poor                             ADL either performed or assessed with clinical judgement   ADL Overall ADL's : Needs assistance/impaired                 Upper Body Dressing : Minimal assistance;Sitting Upper Body Dressing Details (indicate cue type and reason): donning gown Lower Body Dressing: Min guard;Sit to/from stand Lower Body Dressing Details (indicate cue type and reason): donning/doffing socks from chair level             Functional mobility during ADLs: Min guard;Rolling walker (2 wheels)      Extremity/Trunk Assessment Upper Extremity Assessment Upper Extremity Assessment: Generalized weakness   Lower Extremity Assessment Lower Extremity Assessment: Defer to PT evaluation        Vision   Vision Assessment?: No apparent visual deficits   Perception Perception Perception: Not tested   Praxis Praxis Praxis: Not tested    Cognition Arousal/Alertness: Awake/alert Behavior During Therapy: Flat affect, WFL for tasks assessed/performed Overall Cognitive Status: Impaired/Different from baseline Area of Impairment: Problem solving, Awareness, Safety/judgement, Following commands, Attention                   Current Attention Level: Selective   Following Commands: Follows one step commands with increased time Safety/Judgement: Decreased awareness of deficits, Decreased awareness of safety Awareness: Emergent Problem  Solving: Slow processing, Requires verbal cues, Requires tactile cues          Exercises      Shoulder Instructions       General Comments VSS on RA    Pertinent Vitals/ Pain       Pain Assessment Pain Assessment: No/denies pain  Home Living Family/patient expects to be discharged to:: Private  residence Living Arrangements: Spouse/significant other;Children Available Help at Discharge: Family;Available 24 hours/day Type of Home: House Home Access: Stairs to enter [employer] of Steps: 5 in front of home and 2 in back of home Entrance Stairs-Rails: Right;Left;Can reach both Home Layout: One level     Bathroom Shower/Tub: Tub/shower unit;Walk-in shower;Door   Bathroom Toilet: Handicapped height     Home Equipment: [place] - single point;Shower [place] (2 wheels);Rollator (4 wheels);BSC/3in1   Additional Comments: RW with seat in shower and family not well with covid      Prior Functioning/Environment              Frequency  Min 2X/week        Progress Toward Goals  OT Goals(current goals can now be found in the care plan section)  Progress towards OT goals: Progressing toward goals  Acute Rehab OT Goals Patient Stated Goal: none stated OT Goal Formulation: With patient Time For Goal Achievement: 03/12/22 Potential to Achieve Goals: Good ADL Goals Pt Will Perform Lower Body Bathing: with modified independence;sit to/from stand Pt Will Perform Lower Body Dressing: with modified independence;sit to/from stand;sitting/lateral leans Pt Will Transfer to Toilet: with modified independence;ambulating Pt/caregiver will Perform Home Exercise Program: Increased strength;Both right and left upper extremity;With theraband;Independently;With written HEP provided Additional ADL Goal #1: Pt to increase standing tolerance > 10 min during ADLs/IADLs with no more than one rest break  Plan Frequency remains appropriate;Discharge plan needs to be updated    Co-evaluation                 AM-PAC OT "6 Clicks" Daily Activity     Outcome Measure   Help from another person eating meals?: None Help from another person taking care of personal grooming?: A Little Help from another person toileting, which includes using toliet, bedpan, or urinal?: A  Lot Help from another person bathing (including washing, rinsing, drying)?: A Little Help from another person to put on and taking off regular upper body clothing?: A Little Help from another person to put on and taking off regular lower body clothing?: A Little 6 Click Score: 18    End of Session Equipment Utilized During Treatment: Gait belt;Rolling walker (2 wheels)  OT Visit Diagnosis: Unsteadiness on feet (R26.81);Other abnormalities of gait and mobility (R26.89);Muscle weakness (generalized) (M62.81)   Activity Tolerance Patient tolerated treatment well   Patient Left in chair;with call bell/phone within reach;with chair alarm set   Nurse Communication Mobility status        Time: 4287-6811 OT Time Calculation (min): 22 min  Charges: OT General Charges $OT Visit: 1 Visit OT Treatments $Self Care/Home Management : 8-22 mins  Renaye Rakers, OTD, OTR/L [employer] Preferred Acute Rehab [phone] [place] 03/01/2022, 11:04 AM

## 2022-03-05 ENCOUNTER — Emergency Department (HOSPITAL_COMMUNITY): Payer: Medicare Other

## 2022-03-05 ENCOUNTER — Inpatient Hospital Stay (HOSPITAL_COMMUNITY)
Admission: EM | Admit: 2022-03-05 | Discharge: 2022-03-10 | DRG: 177 | Disposition: A | Discharge status: Skilled Nursing Facility | Payer: Medicare Other | Attending: Internal Medicine | Admitting: Internal Medicine

## 2022-03-05 ENCOUNTER — Ambulatory Visit (HOSPITAL_COMMUNITY): Payer: Medicare Other

## 2022-03-05 DIAGNOSIS — Z7901 Long term (current) use of anticoagulants: Secondary | ICD-10-CM

## 2022-03-05 DIAGNOSIS — E1122 Type 2 diabetes mellitus with diabetic chronic kidney disease: Secondary | ICD-10-CM | POA: Diagnosis present

## 2022-03-05 DIAGNOSIS — N1831 Chronic kidney disease, stage 3a: Secondary | ICD-10-CM | POA: Diagnosis present

## 2022-03-05 DIAGNOSIS — J1282 Pneumonia due to coronavirus disease 2019: Secondary | ICD-10-CM | POA: Diagnosis present

## 2022-03-05 DIAGNOSIS — Z9049 Acquired absence of other specified parts of digestive tract: Secondary | ICD-10-CM

## 2022-03-05 DIAGNOSIS — I5023 Acute on chronic systolic (congestive) heart failure: Secondary | ICD-10-CM | POA: Diagnosis present

## 2022-03-05 DIAGNOSIS — U071 COVID-19: Secondary | ICD-10-CM | POA: Diagnosis not present

## 2022-03-05 DIAGNOSIS — E785 Hyperlipidemia, unspecified: Secondary | ICD-10-CM | POA: Diagnosis present

## 2022-03-05 DIAGNOSIS — Z8673 Personal history of transient ischemic attack (TIA), and cerebral infarction without residual deficits: Secondary | ICD-10-CM

## 2022-03-05 DIAGNOSIS — Z79899 Other long term (current) drug therapy: Secondary | ICD-10-CM | POA: Diagnosis not present

## 2022-03-05 DIAGNOSIS — I4821 Permanent atrial fibrillation: Secondary | ICD-10-CM | POA: Diagnosis present

## 2022-03-05 DIAGNOSIS — R5381 Other malaise: Secondary | ICD-10-CM

## 2022-03-05 DIAGNOSIS — R296 Repeated falls: Secondary | ICD-10-CM

## 2022-03-05 DIAGNOSIS — Z789 Other specified health status: Principal | ICD-10-CM

## 2022-03-05 LAB — COMPREHENSIVE METABOLIC PANEL
ALT: 78 U/L — ABNORMAL HIGH (ref 0–44)
AST: 51 U/L — ABNORMAL HIGH (ref 15–41)
Albumin: 2.9 g/dL — ABNORMAL LOW (ref 3.5–5.0)
Alkaline Phosphatase: 93 U/L (ref 38–126)
Anion gap: 9 (ref 5–15)
BUN: 21 mg/dL (ref 8–23)
CO2: 29 mmol/L (ref 22–32)
Calcium: 8.9 mg/dL (ref 8.9–10.3)
Chloride: 106 mmol/L (ref 98–111)
Creatinine, Ser: 1.36 mg/dL — ABNORMAL HIGH (ref 0.61–1.24)
GFR, Estimated: 52 mL/min — ABNORMAL LOW (ref >60)
Glucose, Bld: 106 mg/dL — ABNORMAL HIGH (ref 70–99)
Potassium: 3.8 mmol/L (ref 3.5–5.1)
Sodium: 144 mmol/L (ref 135–145)
Total Bilirubin: 1.7 mg/dL — ABNORMAL HIGH (ref 0.3–1.2)
Total Protein: 6.2 g/dL — ABNORMAL LOW (ref 6.5–8.1)

## 2022-03-05 LAB — CBC WITH DIFFERENTIAL/PLATELET
Abs Immature Granulocytes: 0.03 10*3/uL (ref 0.00–0.07)
Basophils Absolute: 0 10*3/uL (ref 0.0–0.1)
Basophils Relative: 0 %
Eosinophils Absolute: 0.2 10*3/uL (ref 0.0–0.5)
Eosinophils Relative: 3 %
HCT: 46.7 % (ref 39.0–52.0)
Hemoglobin: 15.4 g/dL (ref 13.0–17.0)
Immature Granulocytes: 0 %
Lymphocytes Relative: 20 %
Lymphs Abs: 1.4 10*3/uL (ref 0.7–4.0)
MCH: 31.5 pg (ref 26.0–34.0)
MCHC: 33 g/dL (ref 30.0–36.0)
MCV: 95.5 fL (ref 80.0–100.0)
Monocytes Absolute: 0.7 10*3/uL (ref 0.1–1.0)
Monocytes Relative: 9 %
Neutro Abs: 4.9 10*3/uL (ref 1.7–7.7)
Neutrophils Relative %: 68 %
Platelets: 214 10*3/uL (ref 150–400)
RBC: 4.89 MIL/uL (ref 4.22–5.81)
RDW: 12.6 % (ref 11.5–15.5)
WBC: 7.3 10*3/uL (ref 4.0–10.5)
nRBC: 0 % (ref 0.0–0.2)

## 2022-03-05 LAB — URINALYSIS, ROUTINE W REFLEX MICROSCOPIC
Bilirubin Urine: NEGATIVE
Glucose, UA: NEGATIVE mg/dL
Hgb urine dipstick: NEGATIVE
Ketones, ur: NEGATIVE mg/dL
Leukocytes,Ua: NEGATIVE
Nitrite: NEGATIVE
Protein, ur: NEGATIVE mg/dL
Specific Gravity, Urine: 1.017 (ref 1.005–1.030)
pH: 5 (ref 5.0–8.0)

## 2022-03-05 LAB — RESP PANEL BY RT-PCR (RSV, FLU A&B, COVID)  RVPGX2
Influenza A by PCR: NEGATIVE
Influenza B by PCR: NEGATIVE
Resp Syncytial Virus by PCR: NEGATIVE
SARS Coronavirus 2 by RT PCR: POSITIVE — AB

## 2022-03-05 LAB — PROTIME-INR
INR: 1.2 (ref 0.8–1.2)
Prothrombin Time: 15.2 seconds (ref 11.4–15.2)

## 2022-03-05 MED ORDER — APIXABAN 2.5 MG PO TABS
2.5000 mg | ORAL_TABLET | Freq: Two times a day (BID) | ORAL | Status: DC
  Administered 2022-03-05 – 2022-03-10 (×10): 2.5 mg via ORAL
  Filled 2022-03-05 (×10): qty 1

## 2022-03-05 MED ORDER — FUROSEMIDE 20 MG PO TABS
20.0000 mg | ORAL_TABLET | ORAL | Status: DC
  Administered 2022-03-05 – 2022-03-09 (×3): 20 mg via ORAL
  Filled 2022-03-05 (×4): qty 1

## 2022-03-05 MED ORDER — ROSUVASTATIN CALCIUM 20 MG PO TABS
40.0000 mg | ORAL_TABLET | Freq: Every day | ORAL | Status: DC
  Administered 2022-03-05 – 2022-03-10 (×6): 40 mg via ORAL
  Filled 2022-03-05 (×6): qty 2

## 2022-03-05 MED ORDER — EZETIMIBE 10 MG PO TABS
10.0000 mg | ORAL_TABLET | Freq: Every day | ORAL | Status: DC
  Administered 2022-03-05 – 2022-03-10 (×6): 10 mg via ORAL
  Filled 2022-03-05 (×6): qty 1

## 2022-03-05 MED ORDER — LEVETIRACETAM 750 MG PO TABS
750.0000 mg | ORAL_TABLET | Freq: Two times a day (BID) | ORAL | Status: DC
  Administered 2022-03-05 – 2022-03-10 (×10): 750 mg via ORAL
  Filled 2022-03-05 (×12): qty 1

## 2022-03-05 NOTE — ED Triage Notes (Signed)
Patient bib [facility] from home after a fall on thinners. Patient left [facility] on the 30 the recommendation was for admission to skilled nursing facility. Family took him home. Patient is complaining of back pain. Alert and oriented x2. Per ems he did hit his head. VSS

## 2022-03-05 NOTE — Progress Notes (Signed)
Orthopedic Tech Progress Note Patient Details:  Edwin Wall 12/11/1940 [id number] Level 2 Trauma  Patient ID: [name], male   DOB: 07-Feb-1941, 82 y.o.   MRN: [id number]  Jearld Lesch 03/05/2022, 9:49 AM

## 2022-03-05 NOTE — Hospital Course (Addendum)
Falls Physical deconditioning 2/2 COVID Pneumonia Patient returning to ED with weakness c/b by falls at home after being discharged from hospital on 12/30 . Inially COVID 19 positive on 12/26 after being symptomatic for 5 days. Patient was evaluated by physical therapy during that admission and  recommended to be d/c'd to SNF for rehab; family opted to take home. On presentation, patient continued to feel weak, and endorsing a mild cough. No leukocytosis on presentation. CT head without intracranial processes or fractures. CT cervical, thoracic lumbar spine without evidence of fractures, acute bleeding, or traumatic listhesis. Cough is currently improving and vital signs are stable. Physical therapy re-evaluated patient and recommeds discharge to SNF for short term rehab.  HFrEF  TTE with EF 45-50% on 12/26. Patient is on Lasix 20 mg every other day at home. CXR on presentation without evidence of pulmonary edema. Treated with one time of IV lasix during this hospitalization for bilateral LE edema with appropriate urine output response. His VVS are stable and no evidence of fluid overload on exam today.  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 Was continued on home Eliquis 2.5 mb BID -Continue Eliquis 2.5 mg BID   CKD3a Cr at baseline on admission 1.4 Seizures Followed by neurology outpatient.  No recent seizure activity. Continued on home Keppra 750 mg   Left MCA CVA HLD  On Crestor 20 mg and Zetia 10 mg   DM Diet controlled  ------------------- 1/6: unchanged

## 2022-03-05 NOTE — ED Notes (Signed)
Trauma Response Nurse Documentation  BRACK SHADDOCK is a 82 y.o. male arriving to [facility] ED via EMS  On Eliquis (apixaban) daily. Trauma was activated as a Level 2 based on the following trauma criteria Elderly patients > 65 with head trauma on anti-coagulation (excluding ASA). Trauma team at the bedside on patient arrival.   Patient cleared for CT by Dr. Truett Mainland. Pt transported to CT with trauma response nurse present to monitor. RN remained with the patient throughout their absence from the department for clinical observation. GCS 14.  History   Past Medical History:  Diagnosis Date   Anxiety    Arthritis    Bilateral leg edema    Cataract    Colon cancer ([place]) dx'd 11/2016   "stage 3"   Gout    Gout    Hemorrhoids    History of kidney stones    "passed it"   Hypertension    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[place])    EF 45-50% by echo 10/22   PAF (paroxysmal atrial fibrillation) ([place])    Pre-diabetes      Past Surgical History:  Procedure Laterality Date   CARDIOVERSION [name] 04/23/2021   Procedure: CARDIOVERSION;  Surgeon: [name], MD;  Location: [facility] ENDOSCOPY;  Service: Cardiovascular;  Laterality: [name];   COLECTOMY  12/17/2016   lap; partial sigmoid colectomy/notes 12/17/2016   COLONOSCOPY W/ BIOPSIES AND POLYPECTOMY  11/11/2016   LAPAROSCOPIC SIGMOID COLECTOMY [name] 12/17/2016   Procedure: LAPAROSCOPIC SIGMOID COLECTOMY;  Surgeon: [name], MD;  Location: [facility];  Service: General;  Laterality: [name];   LEFT HEART CATH AND CORONARY ANGIOGRAPHY [name] 03/11/2021   Procedure: LEFT HEART CATH AND CORONARY ANGIOGRAPHY;  Surgeon: [name], MD;  Location: [facility];  Service: Cardiovascular;  Laterality: [name];   NASAL SEPTUM SURGERY     TONSILLECTOMY        Initial Focused Assessment (If applicable, or please see trauma documentation): Patient oriented to name but confused, some baseline memory issues per family, PERR 3  CT's Completed:    CT Head and CT C-Spine /Tspine/Lspine  Interventions:  IV, labs CT Head/C/T/Lspine  Plan for disposition:  Admission to floor   Event Summary: Multiple falls per wife, patient brought to ED and imaging revealed no traumatic injury. Per wife patient refused SNF last admission but she would like him re-evaluated for placement.  Bedside handoff with ED RN Amber.    Park Pope Luz Mares  Trauma Response RN  Please call TRN at [phone] for further assistance.

## 2022-03-05 NOTE — H&P (Cosign Needed Addendum)
Date: 03/05/2022               Patient Name:  Edwin Wall MRN: [id number]  DOB: 18-Aug-1940 Age / Sex: 82 y.o., male   PCP: [name], MD         Medical Service: Internal Medicine Teaching Service         Attending Physician: : Dr.  Cain Sieve    First Contact: Romana Juniper, MD      Pager: [facility] [phone]      Second Contact: Delene Ruffini, MD      Pager: Lysbeth Penner [phone]           After Hours (After 5p/  First Contact Pager: [phone]  weekends / holidays): Second Contact Pager: [phone]   SUBJECTIVE   Chief Complaint: Fall  History of Present Illness: Edwin Wall is a 82 yo M with a PMHx of CVA, HFrEF, CKD, Afib on chronic Eliquis use, HLD, seizures presenting to the ED after sustaining several falls.  Of note, patient was recently discharged from IMTS service for COVID pneumonia and acute on chronic HF exacerbation, initially recommended SNF placement for acute rehab 2/2 deconditioning, however, family opted for discharge home on 12/30. Patient returns today after a fall; he slid off of chair and landed on his back. He was found by his wife.  Patient reports pain in his back, otherwise no pain, tingling, or numbness. Reports his cough has been improving since he left the hospital. No chest pain, SHOB, URI symptoms fevers, or recent chills. He has continued to be compliant with medications, especially seizure med and Eliquis per wife, but has noticed his ankles a bit swollen today.   In the ED, patient still tested positive for COVID (last positive test 12/26), CBC unremarkable, BMP with improving Ccr. 1.6 since prior admission, otherwise unremarkable. U/A pending. CT head without intracranial processes, CT cervical, thoracic lumbar spine without evidence of fractures, acute bleeding, or traumatic listhesis. CXR without active cardiopulmonary disease or evidence of pulm edema   Meds:  Colestipol Eliquis 2.5 mg bid Zetia 10 mg  daily Keppra 750 mg BID Crestor 40 mg dialy Kclor 35mq 1 daily Lasix 20 mg prn   Past Medical History  Past Surgical History:  Procedure Laterality Date   CARDIOVERSION [name] 04/23/2021   Procedure: CARDIOVERSION;  Surgeon: SDonato Heinz MD;  [facility]  Service: Cardiovascular;  Laterality: [name];   COLECTOMY  12/17/2016   lap; partial sigmoid colectomy/notes 12/17/2016   COLONOSCOPY W/ BIOPSIES AND POLYPECTOMY  11/11/2016   LAPAROSCOPIC SIGMOID COLECTOMY [name] 12/17/2016   Procedure: LAPAROSCOPIC SIGMOID COLECTOMY;  Surgeon: BStark Klein MD;  [place]  Service: General;  Laterality: [name];   LEFT HEART CATH AND CORONARY ANGIOGRAPHY [name] 03/11/2021   Procedure: LEFT HEART CATH AND CORONARY ANGIOGRAPHY;  Surgeon: ENelva Bush MD;  [facility];  Service: Cardiovascular;  Laterality: [name];   NASAL SEPTUM SURGERY     TONSILLECTOMY      Social:  [place] children Level of Function: independent with ADLs, dependent with iADls PCP: MAnastasia Pall MD - Family Medicine Substances:  Family History: unable to provide  Allergies: Allergies as of 03/05/2022   (No Known Allergies)    Review of Systems: A complete ROS was negative except as per HPI.   OBJECTIVE:   Physical Exam: Blood pressure 134/86, pulse 86, temperature (!) 97.5 F (36.4 C), temperature source Oral,  resp. rate 17, height 5' 8.5" (1.74 m), weight 90.7 kg, SpO2 99 %.  Constitutional: well-appearing man sitting in bed, in no acute distress HENT: normocephalic atraumatic, mucous membranes moist Cardiovascular: regular rate and rhythm, no m/r/g, No JVD Pulmonary/Chest: normal work of breathing on room air, lungs clear to auscultation bilaterally. No crackles  Abdominal: soft, non-tender, non-distended.  Neurological: alert & oriented to self, date, place MSK: no gross abnormalities. trace pitting edema on bilateral  lower extremities Skin: warm and dry Psych: Normal mood and affect  Labs: CBC    Component Value Date/Time   WBC 7.3 03/05/2022 1540   RBC 4.89 03/05/2022 1540   HGB 15.4 03/05/2022 1540   HGB 13.3 04/18/2021 1508   HCT 46.7 03/05/2022 1540   HCT 40.6 04/18/2021 1508   PLT 214 03/05/2022 1540   PLT 219 04/18/2021 1508   MCV 95.5 03/05/2022 1540   MCV 94 04/18/2021 1508   MCH 31.5 03/05/2022 1540   MCHC 33.0 03/05/2022 1540   RDW 12.6 03/05/2022 1540   RDW 13.3 04/18/2021 1508   LYMPHSABS 1.4 03/05/2022 1540   MONOABS 0.7 03/05/2022 1540   EOSABS 0.2 03/05/2022 1540   BASOSABS 0.0 03/05/2022 1540     CMP     Component Value Date/Time   NA 144 03/05/2022 1540   NA 145 (H) 04/18/2021 1508   K 3.8 03/05/2022 1540   CL 106 03/05/2022 1540   CO2 29 03/05/2022 1540   GLUCOSE 106 (H) 03/05/2022 1540   BUN 21 03/05/2022 1540   BUN 24 04/18/2021 1508   CREATININE 1.36 (H) 03/05/2022 1540   CREATININE 1.49 (H) 01/15/2011 1057   CALCIUM 8.9 03/05/2022 1540   PROT 6.2 (L) 03/05/2022 1540   PROT 5.6 (L) 04/18/2021 1508   ALBUMIN 2.9 (L) 03/05/2022 1540   ALBUMIN 3.4 (L) 04/18/2021 1508   AST 51 (H) 03/05/2022 1540   ALT 78 (H) 03/05/2022 1540   ALKPHOS 93 03/05/2022 1540   BILITOT 1.7 (H) 03/05/2022 1540   BILITOT 0.7 04/18/2021 1508   GFRNONAA 52 (L) 03/05/2022 1540   GFRAA 52 (L) 12/20/2016 0438    Imaging: CT Head Wo Contrast  Result Date: 03/05/2022 CLINICAL DATA:  Trauma EXAM: CT HEAD WITHOUT CONTRAST CT CERVICAL SPINE WITHOUT CONTRAST CT THORACIC SPINE WITHOUT CONTRAST CT LUMBAR SPINE WITHOUT CONTRAST TECHNIQUE: Multidetector CT imaging of the head and cervical, thoracic, and lumbar spine was performed following the standard protocol without intravenous contrast. Multiplanar CT image reconstructions of the cervical spine were also generated. RADIATION DOSE REDUCTION: This exam was performed according to the departmental dose-optimization program which includes  automated exposure control, adjustment of the mA and/or kV according to patient size and/or use of iterative reconstruction technique. COMPARISON:  CT Head 07/15/20, CT AP 11/14/20 FINDINGS: CT HEAD FINDINGS Brain: Redemonstrated is a large region of encephalomalacia along the left frontal lobe, frontal operculum, and portions of the left insular cortex. No CT evidence of new infarct. No hemorrhage. No extra-axial fluid collection. No hydrocephalus. Vascular: No hyperdense vessel or unexpected calcification. Skull: Normal. Negative for fracture or focal lesion. Sinuses/Orbits: Pansinus mucosal thickening with findings suggestive of chronic right sphenoid sinusitis, unchanged compared to 04/14/2021. Bilateral orbits are normal in appearance. Trace right mastoid effusion. No middle ear effusion. Other: None CT CERVICAL, THORACIC, AND LUMBAR  SPINE FINDINGS Alignment: Straightening of the normal cervical lordosis. Skull base and vertebrae: No acute fracture. No primary bone lesion or focal pathologic process. 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 Soft tissues and spinal canal: No prevertebral fluid or swelling. No visible canal hematoma. Disc levels: Multilevel degenerative changes with at least moderate spinal canal narrowing at C6-C7 secondary to a calcified disc bulge. 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 Other: There is bibasilar dependent atelectasis and bronchial wall thickening. There is somewhat diffuse clustered nodularity in the periphery the bilateral upper and lower lobes. No mediastinal adenopathy. No axillary adenopathy. Moderate coronary artery calcifications. Central airways are patent. There are small bilateral nonobstructing renal stones. There is an ill-defined hypodense lesion along the  lateral margin of the left kidney (series 6, image 18), which is incompletely assessed due to lack of IV contrast and motion artifact. This was likely present on prior CT AP dated 11/14/16. IMPRESSION: 1. No acute intracanial process. Unchanged large region of encephalomalacia along the left frontal lobe, frontal operculum, and portions of the left insular cortex. 2. No acute fracture or traumatic listhesis in the cervical, thoracic, or lumbar spine. 3. 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 4. At least moderate spinal canal stenosis at C6-C7. 5. Diffuse clustered nodularity in the periphery of the bilateral upper and lower lobes, which may be infectious or inflammatory. Electronically Signed   By: Marin Roberts M.D.   On: 03/05/2022 11:18   CT Cervical Spine Wo Contrast  Result Date: 03/05/2022 CLINICAL DATA:  Trauma EXAM: CT HEAD WITHOUT CONTRAST CT CERVICAL SPINE WITHOUT CONTRAST CT THORACIC SPINE WITHOUT CONTRAST CT LUMBAR SPINE WITHOUT CONTRAST TECHNIQUE: Multidetector CT imaging of the head and cervical, thoracic, and lumbar spine was performed following the standard protocol without intravenous contrast. Multiplanar CT image reconstructions of the cervical spine were also generated. RADIATION DOSE REDUCTION: This exam was performed according to the departmental dose-optimization program which includes automated exposure control, adjustment of the mA and/or kV according to patient size and/or use of iterative reconstruction technique. COMPARISON:  CT Head 07/15/20, CT AP 11/14/20 FINDINGS: CT HEAD FINDINGS Brain: Redemonstrated is a large region of encephalomalacia along the left frontal lobe, frontal operculum, and portions of the left insular cortex. No CT evidence of new infarct. No hemorrhage. No extra-axial fluid collection. No hydrocephalus. Vascular: No hyperdense vessel or unexpected  calcification. Skull: Normal. Negative for fracture or focal lesion. Sinuses/Orbits: Pansinus mucosal thickening with findings suggestive of chronic right sphenoid sinusitis, unchanged compared to 04/14/2021. Bilateral orbits are normal in appearance. Trace right mastoid effusion. No middle ear effusion. Other: None CT CERVICAL, THORACIC, AND LUMBAR  SPINE FINDINGS Alignment: Straightening of the normal cervical lordosis. Skull base and vertebrae: No acute fracture. No primary bone lesion or focal pathologic process. 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 Soft tissues and spinal canal: No prevertebral fluid or swelling. No visible canal hematoma. Disc levels: Multilevel degenerative changes with at least moderate spinal canal narrowing at C6-C7 secondary to a calcified disc bulge. 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 Other: There is bibasilar dependent atelectasis and bronchial wall thickening. There is somewhat diffuse clustered nodularity in the periphery the bilateral upper and lower  lobes. No mediastinal adenopathy. No axillary adenopathy. Moderate coronary artery calcifications. Central airways are patent. There are small bilateral nonobstructing renal stones. There is an ill-defined hypodense lesion along the lateral margin of the left kidney (series 6, image 18), which is incompletely assessed due to lack of IV contrast and motion artifact. This was likely present on prior CT AP dated 11/14/16. IMPRESSION: 1. No acute intracanial process. Unchanged large region of encephalomalacia along the left frontal lobe, frontal operculum, and portions of the left insular cortex. 2. No acute fracture or traumatic listhesis in the cervical, thoracic, or lumbar spine. 3. 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 4. At least moderate spinal canal stenosis at C6-C7. 5. Diffuse clustered nodularity in the periphery of the bilateral upper and lower lobes, which may be infectious or inflammatory. Electronically Signed   By: Marin Roberts M.D.   On: 03/05/2022 11:18   CT Thoracic Spine Wo Contrast  Result Date: 03/05/2022 CLINICAL DATA:  Trauma EXAM: CT HEAD WITHOUT CONTRAST CT CERVICAL SPINE WITHOUT CONTRAST CT THORACIC SPINE WITHOUT CONTRAST CT LUMBAR SPINE WITHOUT CONTRAST TECHNIQUE: Multidetector CT imaging of the head and cervical, thoracic, and lumbar spine was performed following the standard protocol without intravenous contrast. Multiplanar CT image reconstructions of the cervical spine were also generated. RADIATION DOSE REDUCTION: This exam was performed according to the departmental dose-optimization program which includes automated exposure control, adjustment of the mA and/or kV according to patient size and/or use of iterative reconstruction technique. COMPARISON:  CT Head 07/15/20, CT AP 11/14/20 FINDINGS: CT HEAD FINDINGS Brain: Redemonstrated is a large region of encephalomalacia along the left frontal lobe, frontal operculum, and portions of the left insular cortex. No CT evidence of new infarct. No hemorrhage. No extra-axial fluid collection. No hydrocephalus. Vascular: No hyperdense vessel or unexpected calcification. Skull: Normal. Negative for fracture or focal lesion. Sinuses/Orbits: Pansinus mucosal thickening with findings suggestive of chronic right sphenoid sinusitis, unchanged compared to 04/14/2021. Bilateral orbits are normal in appearance. Trace right mastoid effusion. No middle ear effusion. Other: None CT CERVICAL, THORACIC, AND LUMBAR  SPINE FINDINGS Alignment: Straightening of the normal cervical lordosis. Skull base and vertebrae: No acute fracture. No primary bone lesion or  focal pathologic process. 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 Soft tissues and spinal canal: No prevertebral fluid or swelling. No visible canal hematoma. Disc levels: Multilevel degenerative changes with at least moderate spinal canal narrowing at C6-C7 secondary to a calcified disc bulge. 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 Other: There is bibasilar dependent atelectasis and bronchial wall thickening. There is somewhat diffuse clustered nodularity in the periphery the bilateral upper and lower lobes. No mediastinal adenopathy. No axillary adenopathy. Moderate coronary artery calcifications. Central airways are patent. There are small bilateral nonobstructing renal stones. There is an ill-defined hypodense lesion along the lateral margin of the left kidney (series 6, image 18), which is incompletely assessed due to lack of IV contrast and motion artifact. This was likely present on prior CT AP dated 11/14/16. IMPRESSION: 1. No acute intracanial process. Unchanged large region of encephalomalacia along the left frontal lobe, frontal operculum, and portions of the left insular cortex. 2. No acute fracture or traumatic listhesis in the  cervical, thoracic, or lumbar spine. 3. 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 4. At least moderate spinal canal stenosis at C6-C7. 5. Diffuse clustered nodularity in the periphery of the bilateral upper and lower lobes, which may be infectious or inflammatory. Electronically Signed   By: Marin Roberts M.D.   On: 03/05/2022 11:18   CT Lumbar Spine Wo Contrast  Result Date: 03/05/2022 CLINICAL DATA:  Trauma EXAM: CT HEAD WITHOUT CONTRAST CT CERVICAL SPINE  WITHOUT CONTRAST CT THORACIC SPINE WITHOUT CONTRAST CT LUMBAR SPINE WITHOUT CONTRAST TECHNIQUE: Multidetector CT imaging of the head and cervical, thoracic, and lumbar spine was performed following the standard protocol without intravenous contrast. Multiplanar CT image reconstructions of the cervical spine were also generated. RADIATION DOSE REDUCTION: This exam was performed according to the departmental dose-optimization program which includes automated exposure control, adjustment of the mA and/or kV according to patient size and/or use of iterative reconstruction technique. COMPARISON:  CT Head 07/15/20, CT AP 11/14/20 FINDINGS: CT HEAD FINDINGS Brain: Redemonstrated is a large region of encephalomalacia along the left frontal lobe, frontal operculum, and portions of the left insular cortex. No CT evidence of new infarct. No hemorrhage. No extra-axial fluid collection. No hydrocephalus. Vascular: No hyperdense vessel or unexpected calcification. Skull: Normal. Negative for fracture or focal lesion. Sinuses/Orbits: Pansinus mucosal thickening with findings suggestive of chronic right sphenoid sinusitis, unchanged compared to 04/14/2021. Bilateral orbits are normal in appearance. Trace right mastoid effusion. No middle ear effusion. Other: None CT CERVICAL, THORACIC, AND LUMBAR  SPINE FINDINGS Alignment: Straightening of the normal cervical lordosis. Skull base and vertebrae: No acute fracture. No primary bone lesion or focal pathologic process. 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 Soft tissues and spinal canal: No prevertebral fluid or swelling. No visible canal hematoma. Disc levels: Multilevel degenerative changes with at least moderate spinal canal narrowing at C6-C7 secondary to a calcified disc bulge. 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 Other: There is bibasilar dependent atelectasis and bronchial wall thickening. There is somewhat diffuse clustered nodularity in the periphery the bilateral upper and lower lobes. No mediastinal adenopathy. No axillary adenopathy. Moderate coronary artery calcifications. Central airways are patent. There are small bilateral nonobstructing renal stones. There is an ill-defined hypodense lesion along the lateral margin of the left kidney (series 6, image 18), which is incompletely assessed due to lack of IV contrast and motion artifact. This was likely present on prior CT AP dated 11/14/16. IMPRESSION: 1. No acute intracanial process. Unchanged large region of encephalomalacia along the left frontal lobe, frontal operculum, and portions of the left insular cortex. 2. No acute fracture or traumatic listhesis in the cervical, thoracic, or lumbar spine. 3. 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 4. At least moderate spinal canal stenosis at C6-C7. 5. Diffuse clustered nodularity in the periphery of the bilateral upper and lower lobes, which may be infectious or inflammatory. Electronically Signed   By: Marin Roberts M.D.   On: 03/05/2022 11:18   DG Chest Port 1 View  Result Date: 03/05/2022 CLINICAL DATA:  Fall on blood thinners with back EXAM: PORTABLE CHEST 1  VIEW COMPARISON:  Chest radiograph dated 02/25/2022 FINDINGS: Normal lung volumes. No focal consolidations. No pleural effusion or pneumothorax. The heart size and mediastinal contours are within normal limits. The visualized skeletal structures are unremarkable. IMPRESSION: 1. No active disease. 2.  No radiographic finding of acute displaced fracture. Electronically Signed   By: Darrin Nipper M.D.   On: 03/05/2022 10:11      EKG: personally reviewed my interpretation is atrial fibrillation similar to  prior studies  ASSESSMENT & PLAN:   Assessment & Plan by Problem: Physical deconditioning 2/2 COVID 19 PNA   Edwin Wall is a 82 yo M with a PMHx of CVA, HFrEF, CKD, Afib on chronic Eliquis use, HLD, seizures presenting to the ED after sustaining several falls in the setting of deconditioning 2/2 of recent hx of hospitalization for COVID PNA and acute on chronic HF exacerbation  Falls Physical deconditioning 2/2 COVID Pneumonia Patient returning to ED with weakness c/b by falls at home after being discharged from hospital. Initially recommended to be d/c'd to SNF for rehab; family opted to take home. He is still feels weak, and endorsing a mild cough, though is improved per physical exam and imaging findings. No leukocytosis on presentation.  -Conservative management for cough -Physical therapy evaluation for discharge planning  HFrEF TTE with EF 45-50% on 12/26. Patient is on Lasix 20 mg every other day.  Admission wt 90.7; however, trace bilateral lower extremity edema on exam. Lung sound clear on auscultation, and saturating 100 percent on RA. No murmurs or JVD appreciated on admission. -F/up BNP -Consider re-starting PO lasix in the AM  CKD3a Baseline Cr 1.5-1.8. Cr today 1.36, GFR 52 -Continue to monitor   Permanent Afib on Eliquis Prior CVA Rate controlled without medications -Continue Eliquis 2.5 mg BID  Seizures Followed by neurology.  No recent seizure activity. -Continue Keppra 750 mg   Left MCA CVA HLD  -Continue Crestor 20 mg -Continue Zetia 10 mg   DM Diet controlled.  -Continue monitoring blood glucose  Diet: Heart Healthy VTE: Eliquis Code: Full  Prior to Admission Living Arrangement: Home, living family Anticipated Discharge Location: SNF Barriers to Discharge: Acceptance to SNF  Dispo: Admit patient to Observation with expected length of stay less than 2 midnights.  Signed:   Romana Juniper, MD Internal Medicine  Resident PGY-1 03/05/2022, 7:15 PM

## 2022-03-05 NOTE — ED Provider Notes (Signed)
[facility] EMERGENCY DEPARTMENT Provider Note  CSN: [id number] Arrival date & time: 03/05/22 0930  Chief Complaint(s) Fall  HPI Edwin Wall is a 82 y.o. male with history of CHF, prior stroke, diabetes, A-fib on Eliquis, chronic kidney disease, recently admission for COVID and CHF, found to be deconditioned, refused SNF and discharged home, presenting with fall.  Patient reports that he fell over earlier today.  He thinks he hit his head and landed on his back.  He reports he is back pain throughout his back.  He denies any chest pain, shortness of breath, fevers, chills, runny nose, sore throat.  He denies any headaches.  He reports the pain is mild.  Denies any numbness, tingling, focal weakness.   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[place])    EF 45-50% by echo 10/22   PAF (paroxysmal atrial fibrillation) ([place])    Pre-diabetes    Patient Active Problem List   Diagnosis Date Noted   Non-traumatic rhabdomyolysis 02/28/2022   Acute on chronic heart failure ([place]) 02/25/2022   Pneumonia due to COVID-19 virus 02/25/2022   Bilateral leg edema 04/18/2021   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[place]) 04/18/2021   AMS (altered mental status) 04/14/2021   Syncope and collapse 03/11/2021   Abnormal nuclear stress test 03/11/2021   Protein-calorie malnutrition, severe 01/18/2021   Orthostatic hypotension    Fall    Weight loss    Secondary hypercoagulable state ([place]) 01/02/2021   LOC (loss of consciousness) ([place]) 12/23/2020   Abnormality of gait 09/10/2020   Leukocytosis    Somnolence    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/2020   Acute right hemiparesis ([place]) 07/19/2020   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 Authorizing Provider  apixaban (ELIQUIS) 2.5 MG TABS tablet Take 1 tablet (2.5 mg total) by mouth 2 (two) times daily. 12/20/20   Sueanne Margarita, MD  colestipol (COLESTID) 1 g tablet Take 1 tablet (1 g total) by mouth 2 (two) times daily. Patient taking differently: Take 1 g by mouth daily. 08/01/20   Love, Ivan Anchors, PA-C  diclofenac Sodium (VOLTAREN) 1 % GEL Apply 4 g topically 4 (four) times daily. 04/15/21   Lajean Manes, MD  ezetimibe (ZETIA) 10 MG tablet Take 1 tablet (10 mg total) by mouth daily. 04/19/21   Sueanne Margarita, MD  furosemide (LASIX) 20 MG tablet Take 1 tablet (20 mg total) by mouth every other day. 04/19/21   Sueanne Margarita, MD  levETIRAcetam (KEPPRA) 750 MG tablet Take 1 tablet (750 mg total) by mouth 2 (two) times daily.  12/13/21 03/13/22  Cameron Sprang, MD  liver oil-zinc oxide (DESITIN) 40 % ointment Apply 1 application topically as needed for irritation.    [provider]  neomycin-bacitracin-polymyxin (NEOSPORIN) ointment Apply 1 application. topically as needed for wound care.    [provider]  Phenylephrine HCl (NEO-SYNEPHRINE NA) Place 1 spray into the nose daily as needed (congestion).    [provider]  potassium chloride (KLOR-CON M) 10 MEQ tablet Take 10 mEq by mouth daily.    [provider]  rosuvastatin (CRESTOR) 40 MG tablet Take 1 tablet (40 mg total) by mouth daily. 08/01/20    Bary Leriche, PA-C                                                                                                                                    Past Surgical History Past Surgical History:  Procedure Laterality Date   CARDIOVERSION [name] 04/23/2021   Procedure: CARDIOVERSION;  Surgeon: [name], MD;  [facility] ENDOSCOPY;  Service: Cardiovascular;  Laterality: [name];   COLECTOMY  12/17/2016   lap; partial sigmoid colectomy/notes 12/17/2016   COLONOSCOPY W/ BIOPSIES AND POLYPECTOMY  11/11/2016   LAPAROSCOPIC SIGMOID COLECTOMY [name] 12/17/2016   Procedure: LAPAROSCOPIC SIGMOID COLECTOMY;  Surgeon: [name], MD;  Location: [facility];  Service: General;  Laterality: [name];   LEFT HEART CATH AND CORONARY ANGIOGRAPHY [name] 03/11/2021   Procedure: LEFT HEART CATH AND CORONARY ANGIOGRAPHY;  Surgeon: [name], MD;  Location: [facility] CV LAB;  Service: Cardiovascular;  Laterality: [name];   NASAL SEPTUM SURGERY     TONSILLECTOMY     Family History Family History  Problem Relation Age of Onset   Cancer Mother        LUNG   COPD Father     Social History Social History   Tobacco Use   Smoking status: Never   Smokeless tobacco: Never   Tobacco comments:    Never smoke 05/21/21  Vaping Use   Vaping Use: Never used  Substance Use Topics   Alcohol use: No   Drug use: No   Allergies Patient has no known allergies.  Review of Systems Review of Systems  All other systems reviewed and are negative.   Physical Exam Vital Signs  I have reviewed the triage vital signs BP 134/86   Pulse 86   Temp (!) 97.5 F (36.4 C) (Oral)   Resp 17   Ht 5' 8.5" (1.74 m)   Wt 90.7 kg   SpO2 99%   BMI 29.97 kg/m  Physical Exam Vitals and nursing note reviewed.  Constitutional:      General: He is not in acute distress.    Appearance: Normal appearance.  HENT:     Head: Normocephalic and atraumatic.     Mouth/Throat:     Mouth: Mucous membranes are moist.  Eyes:      Conjunctiva/sclera: Conjunctivae normal.  Cardiovascular:  Rate and Rhythm: Normal rate and regular rhythm.  Pulmonary:     Effort: Pulmonary effort is normal. No respiratory distress.     Breath sounds: Normal breath sounds.  Abdominal:     General: Abdomen is flat.     Palpations: Abdomen is soft.     Tenderness: There is no abdominal tenderness.  Musculoskeletal:     Right lower leg: No edema.     Left lower leg: No edema.     Comments: No midline C spine tenderness.  Mild diffuse nonfocal T and L-spine tenderness with no step-off, no wounds on the back..  No chest wall tenderness or crepitus.  Full painless range of motion at the bilateral upper extremities including the shoulders, elbows, wrists, hand and fingers, and in the bilateral lower extremities including the hips, knees, ankle, toes.  No focal bony tenderness, injury or deformity.   Skin:    General: Skin is warm and dry.     Capillary Refill: Capillary refill takes less than 2 seconds.  Neurological:     Mental Status: He is alert and oriented to person, place, and time. Mental status is at baseline.  Psychiatric:        Mood and Affect: Mood normal.        Behavior: Behavior normal.     ED Results and Treatments Labs (all labs ordered are listed, but only abnormal results are displayed) Labs Reviewed  RESP PANEL BY RT-PCR (RSV, FLU A&B, COVID)  RVPGX2  COMPREHENSIVE METABOLIC PANEL  CBC WITH DIFFERENTIAL/PLATELET  URINALYSIS, ROUTINE W REFLEX MICROSCOPIC  PROTIME-INR                                                                                                                          Radiology CT Head Wo Contrast  Result Date: 03/05/2022 CLINICAL DATA:  Trauma EXAM: CT HEAD WITHOUT CONTRAST CT CERVICAL SPINE WITHOUT CONTRAST CT THORACIC SPINE WITHOUT CONTRAST CT LUMBAR SPINE WITHOUT CONTRAST TECHNIQUE: Multidetector CT imaging of the head and cervical, thoracic, and lumbar spine was performed following the  standard protocol without intravenous contrast. Multiplanar CT image reconstructions of the cervical spine were also generated. RADIATION DOSE REDUCTION: This exam was performed according to the departmental dose-optimization program which includes automated exposure control, adjustment of the mA and/or kV according to patient size and/or use of iterative reconstruction technique. COMPARISON:  CT Head 07/15/20, CT AP 11/14/20 FINDINGS: CT HEAD FINDINGS Brain: Redemonstrated is a large region of encephalomalacia along the left frontal lobe, frontal operculum, and portions of the left insular cortex. No CT evidence of new infarct. No hemorrhage. No extra-axial fluid collection. No hydrocephalus. Vascular: No hyperdense vessel or unexpected calcification. Skull: Normal. Negative for fracture or focal lesion. Sinuses/Orbits: Pansinus mucosal thickening with findings suggestive of chronic right sphenoid sinusitis, unchanged compared to 04/14/2021. Bilateral orbits are normal in appearance. Trace right mastoid effusion. No middle ear effusion. Other: None CT CERVICAL, THORACIC, AND LUMBAR  SPINE FINDINGS Alignment: Straightening of  the normal cervical lordosis. Skull base and vertebrae: No acute fracture. No primary bone lesion or focal pathologic process. 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 Soft tissues and spinal canal: No prevertebral fluid or swelling. No visible canal hematoma. Disc levels: Multilevel degenerative changes with at least moderate spinal canal narrowing at C6-C7 secondary to a calcified disc bulge. 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 Other: There is bibasilar dependent atelectasis and bronchial wall thickening. There is somewhat diffuse clustered nodularity in the periphery the bilateral  upper and lower lobes. No mediastinal adenopathy. No axillary adenopathy. Moderate coronary artery calcifications. Central airways are patent. There are small bilateral nonobstructing renal stones. There is an ill-defined hypodense lesion along the lateral margin of the left kidney (series 6, image 18), which is incompletely assessed due to lack of IV contrast and motion artifact. This was likely present on prior CT AP dated 11/14/16. IMPRESSION: 1. No acute intracanial process. Unchanged large region of encephalomalacia along the left frontal lobe, frontal operculum, and portions of the left insular cortex. 2. No acute fracture or traumatic listhesis in the cervical, thoracic, or lumbar spine. 3. 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 4. At least moderate spinal canal stenosis at C6-C7. 5. Diffuse clustered nodularity in the periphery of the bilateral upper and lower lobes, which may be infectious or inflammatory. Electronically Signed   By: Marin Roberts M.D.   On: 03/05/2022 11:18   CT Cervical Spine Wo Contrast  Result Date: 03/05/2022 CLINICAL DATA:  Trauma EXAM: CT HEAD WITHOUT CONTRAST CT CERVICAL SPINE WITHOUT CONTRAST CT THORACIC SPINE WITHOUT CONTRAST CT LUMBAR SPINE WITHOUT CONTRAST TECHNIQUE: Multidetector CT imaging of the head and cervical, thoracic, and lumbar spine was performed following the standard protocol without intravenous contrast. Multiplanar CT image reconstructions of the cervical spine were also generated. RADIATION DOSE REDUCTION: This exam was performed according to the departmental dose-optimization program which includes automated exposure control, adjustment of the mA and/or kV according to patient size and/or use of iterative reconstruction technique. COMPARISON:  CT Head 07/15/20, CT AP 11/14/20 FINDINGS: CT HEAD FINDINGS Brain: Redemonstrated is a large region of  encephalomalacia along the left frontal lobe, frontal operculum, and portions of the left insular cortex. No CT evidence of new infarct. No hemorrhage. No extra-axial fluid collection. No hydrocephalus. Vascular: No hyperdense vessel or unexpected calcification. Skull: Normal. Negative for fracture or focal lesion. Sinuses/Orbits: Pansinus mucosal thickening with findings suggestive of chronic right sphenoid sinusitis, unchanged compared to 04/14/2021. Bilateral orbits are normal in appearance. Trace right mastoid effusion. No middle ear effusion. Other: None CT CERVICAL, THORACIC, AND LUMBAR  SPINE FINDINGS Alignment: Straightening of the normal cervical lordosis. Skull base and vertebrae: No acute fracture. No primary bone lesion or focal pathologic process. 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 Soft tissues and spinal canal: No prevertebral fluid or swelling. No visible canal hematoma. Disc levels: Multilevel degenerative changes with at least moderate spinal canal narrowing at C6-C7 secondary to a calcified disc bulge. 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 Other: There is bibasilar dependent atelectasis and bronchial wall thickening. There is somewhat diffuse clustered nodularity in the periphery the bilateral upper and lower lobes. No mediastinal adenopathy. No axillary adenopathy. Moderate coronary artery calcifications. Central airways are patent. There are small bilateral nonobstructing renal stones. There is an ill-defined hypodense lesion along the lateral margin of the left kidney (series 6, image 18), which is incompletely assessed due to lack of IV contrast and motion artifact. This was likely present on prior CT AP dated 11/14/16. IMPRESSION: 1. No acute intracanial process. Unchanged large  region of encephalomalacia along the left frontal lobe, frontal operculum, and portions of the left insular cortex. 2. No acute fracture or traumatic listhesis in the cervical, thoracic, or lumbar spine. 3. 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 4. At least moderate spinal canal stenosis at C6-C7. 5. Diffuse clustered nodularity in the periphery of the bilateral upper and lower lobes, which may be infectious or inflammatory. Electronically Signed   By: Marin Roberts M.D.   On: 03/05/2022 11:18   CT Thoracic Spine Wo Contrast  Result Date: 03/05/2022 CLINICAL DATA:  Trauma EXAM: CT HEAD WITHOUT CONTRAST CT CERVICAL SPINE WITHOUT CONTRAST CT THORACIC SPINE WITHOUT CONTRAST CT LUMBAR SPINE WITHOUT CONTRAST TECHNIQUE: Multidetector CT imaging of the head and cervical, thoracic, and lumbar spine was performed following the standard protocol without intravenous contrast. Multiplanar CT image reconstructions of the cervical spine were also generated. RADIATION DOSE REDUCTION: This exam was performed according to the departmental dose-optimization program which includes automated exposure control, adjustment of the mA and/or kV according to patient size and/or use of iterative reconstruction technique. COMPARISON:  CT Head 07/15/20, CT AP 11/14/20 FINDINGS: CT HEAD FINDINGS Brain: Redemonstrated is a large region of encephalomalacia along the left frontal lobe, frontal operculum, and portions of the left insular cortex. No CT evidence of new infarct. No hemorrhage. No extra-axial fluid collection. No hydrocephalus. Vascular: No hyperdense vessel or unexpected calcification. Skull: Normal. Negative for fracture or focal lesion. Sinuses/Orbits: Pansinus mucosal thickening with findings suggestive of chronic right sphenoid sinusitis, unchanged compared to 04/14/2021. Bilateral orbits are normal in appearance.  Trace right mastoid effusion. No middle ear effusion. Other: None CT CERVICAL, THORACIC, AND LUMBAR  SPINE FINDINGS Alignment: Straightening of the normal cervical lordosis. Skull base and vertebrae: No acute fracture. No primary bone lesion or focal pathologic process. 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 Soft tissues and spinal canal: No prevertebral fluid or swelling. No visible canal hematoma. Disc levels: Multilevel degenerative changes with at least moderate spinal canal narrowing at C6-C7 secondary to a calcified disc bulge. 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 Other: There is bibasilar dependent atelectasis and bronchial wall thickening. There is somewhat diffuse clustered nodularity in the periphery the bilateral upper and lower lobes. No mediastinal adenopathy. No axillary adenopathy. Moderate coronary artery calcifications. Central airways are patent. There are small bilateral nonobstructing renal stones. There is an ill-defined hypodense lesion along the lateral margin of the left kidney (series 6, image 18), which is incompletely assessed due to lack of IV contrast and motion artifact. This was likely present on prior CT AP dated 11/14/16. IMPRESSION: 1. No acute intracanial process. Unchanged large  region of encephalomalacia along the left frontal lobe, frontal operculum, and portions of the left insular cortex. 2. No acute fracture or traumatic listhesis in the cervical, thoracic, or lumbar spine. 3. 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 4. At least moderate spinal canal stenosis at C6-C7. 5. Diffuse clustered nodularity in the periphery of the bilateral upper and lower  lobes, which may be infectious or inflammatory. Electronically Signed   By: Marin Roberts M.D.   On: 03/05/2022 11:18   CT Lumbar Spine Wo Contrast  Result Date: 03/05/2022 CLINICAL DATA:  Trauma EXAM: CT HEAD WITHOUT CONTRAST CT CERVICAL SPINE WITHOUT CONTRAST CT THORACIC SPINE WITHOUT CONTRAST CT LUMBAR SPINE WITHOUT CONTRAST TECHNIQUE: Multidetector CT imaging of the head and cervical, thoracic, and lumbar spine was performed following the standard protocol without intravenous contrast. Multiplanar CT image reconstructions of the cervical spine were also generated. RADIATION DOSE REDUCTION: This exam was performed according to the departmental dose-optimization program which includes automated exposure control, adjustment of the mA and/or kV according to patient size and/or use of iterative reconstruction technique. COMPARISON:  CT Head 07/15/20, CT AP 11/14/20 FINDINGS: CT HEAD FINDINGS Brain: Redemonstrated is a large region of encephalomalacia along the left frontal lobe, frontal operculum, and portions of the left insular cortex. No CT evidence of new infarct. No hemorrhage. No extra-axial fluid collection. No hydrocephalus. Vascular: No hyperdense vessel or unexpected calcification. Skull: Normal. Negative for fracture or focal lesion. Sinuses/Orbits: Pansinus mucosal thickening with findings suggestive of chronic right sphenoid sinusitis, unchanged compared to 04/14/2021. Bilateral orbits are normal in appearance. Trace right mastoid effusion. No middle ear effusion. Other: None CT CERVICAL, THORACIC, AND LUMBAR  SPINE FINDINGS Alignment: Straightening of the normal cervical lordosis. Skull base and vertebrae: No acute fracture. No primary bone lesion or focal pathologic process. 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 Soft tissues and spinal canal: No prevertebral fluid or swelling. No visible canal hematoma. Disc levels: Multilevel degenerative  changes with at least moderate spinal canal narrowing at C6-C7 secondary to a calcified disc bulge. 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 Other: There is bibasilar dependent atelectasis and bronchial wall thickening. There is somewhat diffuse clustered nodularity in the periphery the bilateral upper and lower lobes. No mediastinal adenopathy. No axillary adenopathy. Moderate coronary artery calcifications. Central airways are patent. There are small bilateral nonobstructing renal stones. There is an ill-defined hypodense lesion along the lateral margin of the left kidney (series 6, image 18), which is incompletely assessed due to lack of IV contrast and motion artifact. This was likely present on prior CT AP dated 11/14/16. IMPRESSION: 1. No acute intracanial process. Unchanged large region of encephalomalacia along the left frontal lobe, frontal operculum, and portions of the left insular cortex. 2. No acute fracture or traumatic listhesis in the cervical, thoracic, or lumbar spine. 3. 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 4. At least moderate spinal canal stenosis at C6-C7. 5. Diffuse clustered nodularity in the periphery of the bilateral upper and lower lobes, which may be infectious or inflammatory. Electronically Signed   By: Marin Roberts M.D.   On:  03/05/2022 11:18   DG Chest Port 1 View  Result Date: 03/05/2022 CLINICAL DATA:  Fall on blood thinners with back EXAM: PORTABLE CHEST 1 VIEW COMPARISON:  Chest radiograph dated 02/25/2022 FINDINGS: Normal lung volumes. No focal consolidations. No pleural effusion or pneumothorax. The heart size and mediastinal contours are within normal limits. The visualized skeletal structures are  unremarkable. IMPRESSION: 1. No active disease. 2.  No radiographic finding of acute displaced fracture. Electronically Signed   By: Darrin Nipper M.D.   On: 03/05/2022 10:11    Pertinent labs & imaging results that were available during my care of the patient were reviewed by me and considered in my medical decision making (see MDM for details).  Medications Ordered in ED Medications - No data to display                                                                                                                                   Procedures Procedures  (including critical care time)  Medical Decision Making / ED Course   MDM:  82 year old male presenting to the emergency department after fall at home.  Patient well-appearing, physical exam with mild diffuse T and L-spine tenderness but no focal point tenderness.  No cervical spine tenderness.  Patient reportedly hit his head.  Given head strike on Eliquis will obtain CT head.  Given age will obtain CT C-spine as well although no tenderness.  Patient has no focal T or L-spine tenderness to suggest acute fracture but given age will obtain CTs of the T and L-spine as well.  Chest x-ray unremarkable.  Will obtain basic laboratory testing.  Will reassess.  Given fall at home after discharge, patient may need to stay in the hospital if he is willing to for placement.  Clinical Course as of 03/05/22 1512  Wed Mar 05, 2022  1510 Discussed with the patient's wife.  She reports that he has had multiple recurrent falls at home.  She wants him admitted for placement.  Discussed with the internal medicine teaching service who will admit the patient.  Laboratory testing still pending.  Patient feels well and has no complaints. [WS]    Clinical Course User Index [WS] Cristie Hem, MD     Additional history obtained: -Additional history obtained from ems -External records from outside source obtained and reviewed including: Chart review  including previous notes, labs, imaging, consultation notes including discharge summary from a few days ago   Lab Tests: -I ordered, reviewed, and interpreted labs.   The pertinent results include:   Labs Reviewed  RESP PANEL BY RT-PCR (RSV, FLU A&B, COVID)  RVPGX2  COMPREHENSIVE METABOLIC PANEL  CBC WITH DIFFERENTIAL/PLATELET  URINALYSIS, ROUTINE W REFLEX MICROSCOPIC  PROTIME-INR     EKG   EKG Interpretation  Date/Time:  Wednesday March 05 2022 10:01:20 EST Ventricular Rate:  63 PR Interval:  80 QRS Duration: 147 QT Interval:  463 QTC  Calculation: 467 R Axis:   270 Text Interpretation: Atrial fibrillation RBBB and LAFB LVH with secondary repolarization abnormality Artifact in lead(s) I II III aVR aVL aVF Confirmed by Garnette Gunner [phone]) on 03/05/2022 11:40:10 AM         Imaging Studies ordered: I ordered imaging studies including CT head, CT C/T/L spine On my interpretation imaging demonstrates no acute process I independently visualized and interpreted imaging. I agree with the radiologist interpretation   Medicines ordered and prescription drug management: No orders of the defined types were placed in this encounter.   -I have reviewed the patients home medicines and have made adjustments as needed   Consultations Obtained: I requested consultation with the IMTS,  and discussed lab and imaging findings as well as pertinent plan - they recommend: admit for placement   Cardiac Monitoring: The patient was maintained on a cardiac monitor.  I personally viewed and interpreted the cardiac monitored which showed an underlying rhythm of: NSR  Social Determinants of Health:  Diagnosis or treatment significantly limited by social determinants of health: not enough help at home   Reevaluation: After the interventions noted above, I reevaluated the patient and found that they have improved  Co morbidities that complicate the patient evaluation  Past Medical  History:  Diagnosis Date   Anxiety    Arthritis    Bilateral leg edema    Cataract    Colon cancer ([place]) dx'd 11/2016   "stage 3"   Gout    Gout    Hemorrhoids    History of kidney stones    "passed it"   Hypertension    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[place])    EF 45-50% by echo 10/22   PAF (paroxysmal atrial fibrillation) ([facility])    Pre-diabetes       Dispostion: Disposition decision including need for hospitalization was considered, and patient admitted to the hospital.    Final Clinical Impression(s) / ED Diagnoses Final diagnoses:  Self-care deficit  Recurrent falls     This chart was dictated using voice recognition software.  Despite best efforts to proofread,  errors can occur which can change the documentation meaning.    Cristie Hem, MD 03/05/22 [phone]

## 2022-03-06 ENCOUNTER — Other Ambulatory Visit: Payer: Self-pay

## 2022-03-06 DIAGNOSIS — R5381 Other malaise: Secondary | ICD-10-CM | POA: Diagnosis not present

## 2022-03-06 DIAGNOSIS — J1282 Pneumonia due to coronavirus disease 2019: Secondary | ICD-10-CM

## 2022-03-06 DIAGNOSIS — R296 Repeated falls: Secondary | ICD-10-CM

## 2022-03-06 DIAGNOSIS — U071 COVID-19: Secondary | ICD-10-CM

## 2022-03-06 LAB — BASIC METABOLIC PANEL
Anion gap: 7 (ref 5–15)
BUN: 26 mg/dL — ABNORMAL HIGH (ref 8–23)
CO2: 27 mmol/L (ref 22–32)
Calcium: 8.7 mg/dL — ABNORMAL LOW (ref 8.9–10.3)
Chloride: 107 mmol/L (ref 98–111)
Creatinine, Ser: 1.47 mg/dL — ABNORMAL HIGH (ref 0.61–1.24)
GFR, Estimated: 48 mL/min — ABNORMAL LOW (ref >60)
Glucose, Bld: 119 mg/dL — ABNORMAL HIGH (ref 70–99)
Potassium: 4.5 mmol/L (ref 3.5–5.1)
Sodium: 141 mmol/L (ref 135–145)

## 2022-03-06 LAB — CBC
HCT: 43.6 % (ref 39.0–52.0)
Hemoglobin: 13.8 g/dL (ref 13.0–17.0)
MCH: 30.5 pg (ref 26.0–34.0)
MCHC: 31.7 g/dL (ref 30.0–36.0)
MCV: 96.5 fL (ref 80.0–100.0)
Platelets: 199 10*3/uL (ref 150–400)
RBC: 4.52 MIL/uL (ref 4.22–5.81)
RDW: 12.9 % (ref 11.5–15.5)
WBC: 6.5 10*3/uL (ref 4.0–10.5)
nRBC: 0 % (ref 0.0–0.2)

## 2022-03-06 LAB — MAGNESIUM: Magnesium: 1.8 mg/dL (ref 1.7–2.4)

## 2022-03-06 LAB — BRAIN NATRIURETIC PEPTIDE: B Natriuretic Peptide: 121.3 pg/mL — ABNORMAL HIGH (ref 0.0–100.0)

## 2022-03-06 MED ORDER — GUAIFENESIN ER 600 MG PO TB12
600.0000 mg | ORAL_TABLET | Freq: Two times a day (BID) | ORAL | Status: DC
  Administered 2022-03-06 – 2022-03-10 (×9): 600 mg via ORAL
  Filled 2022-03-06 (×9): qty 1

## 2022-03-06 MED ORDER — FUROSEMIDE 10 MG/ML IJ SOLN
20.0000 mg | Freq: Once | INTRAMUSCULAR | Status: AC
  Administered 2022-03-06: 20 mg via INTRAVENOUS
  Filled 2022-03-06: qty 2

## 2022-03-06 NOTE — Progress Notes (Signed)
Hospital day#1 Subjective:  Overnight Events: None   Patient feeling better this morning with minimal pain. Main concern were swollen ankles. Denies SOB, chest pain, n/v. Continues to cough but has been improving. Discussed need for diuresis today and possible hospital discharge tomorrow pending SNF approval for short term rehab.  Objective:  Vital signs in last 24 hours: Vitals:   03/06/22 0032 03/06/22 0356 03/06/22 0752 03/06/22 1137  BP: 125/76 118/81 108/74   Pulse: 86 92 84   Resp: '18 17 16   '$ Temp: (!) 97.4 F (36.3 C) 97.7 F (36.5 C) 98.2 F (36.8 C)   TempSrc: Oral Oral Oral   SpO2: 100% 96% 97% 95%  Weight:      Height:       Supplemental O2: Room Air SpO2: 95 % Filed Weights   03/05/22 0957  Weight: 90.7 kg    Physical Exam:  Constitutional:Chronically ill-appearing man sitting in bed, in no acute distress HENT: normocephalic atraumatic, moist mucous membranes Neck: supple, no JVD Cardiovascular: irregular rhythm, no m/r/g Pulmonary/Chest: normal work of breathing on room air, lungs clear to auscultation bilaterally. No wheezing Abdominal: Normal BS, soft, non-tender, non-distended. MSK: normal bulk and tone. 1-2+ pitting edema in bilateral lower extremities Neurological: alert & oriented to self, month, situation Skin: warm and dry Psych: Pleasant mood and affect    Intake/Output Summary (Last 24 hours) at 03/06/2022 1307 Last data filed at 03/06/2022 1214 Gross per 24 hour  Intake --  Output 500 ml  Net -500 ml   Net IO Since Admission: -500 mL [03/06/22 1307]  Pertinent Labs:    Latest Ref Rng & Units 03/06/2022    1:28 AM 03/05/2022    3:40 PM 02/28/2022   12:59 AM  CBC  WBC 4.0 - 10.5 K/uL 6.5  7.3  3.8   Hemoglobin 13.0 - 17.0 g/dL 13.8  15.4  12.3   Hematocrit 39.0 - 52.0 % 43.6  46.7  36.7   Platelets 150 - 400 K/uL 199  214  125        Latest Ref Rng & Units 03/06/2022    1:28 AM 03/05/2022    3:40 PM 02/28/2022   12:59 AM  CMP   Glucose 70 - 99 mg/dL 119  106  99   BUN 8 - 23 mg/dL '26  21  30   '$ Creatinine 0.61 - 1.24 mg/dL 1.47  1.36  1.59   Sodium 135 - 145 mmol/L 141  144  140   Potassium 3.5 - 5.1 mmol/L 4.5  3.8  4.4   Chloride 98 - 111 mmol/L 107  106  107   CO2 22 - 32 mmol/L '27  29  23   '$ Calcium 8.9 - 10.3 mg/dL 8.7  8.9  8.3   Total Protein 6.5 - 8.1 g/dL  6.2    Total Bilirubin 0.3 - 1.2 mg/dL  1.7    Alkaline Phos 38 - 126 U/L  93    AST 15 - 41 U/L  51    ALT 0 - 44 U/L  78      Imaging: No results found.  Assessment/Plan:   Principal Problem:   Physical deconditioning Active Problems:   COVID-19   Recurrent falls   Patient Summary: Edwin Wall is a 82 y.o. with a pertinent PMH of CVA, HFrEF, CKD3a, Afib on Eliquis, HLD, seizures who presented after a fall secondary to medical deconditioning 2/2 to recent COVID PNA admission  and admitted for  weakness for same and mild heart failure exacerbation   HFrEF Mild Acute HF exacerbation TTE with EF 45-50% on 12/26. Patient is on Lasix 20 mg every other day.  Admission wt 90.7; however, with increased lower extremity edema on exam. BNP 121. Patient's breathing effort is normal, with good saturations on RA. Abdomen is protuberant. Suspect patient has not taken PRN lasix in the past couple of days as family members who manage medications are sick. He has taken one PO dose of Lasix yesterday; minimal output recorded. Will trial one time dose of IV lasix as suspect decreased absorption with PO theray -Strict I/o -Weigh daily -1x 20 mg IV lasix 03/06/2022  Falls Physical deconditioning 2/2 COVID Pneumonia Patient returning to ED with weakness c/b by falls at home after being discharged from hospital 2/2 to deconditioning after 02/25/2022 hospitalization. Continues to be subjectively weak. Mild cough improving. No leukocytosis today -Guaifenesin 600 mg BID -Conservative management for cough -Physical therapy evaluation: recommend SNF,  awaiting bed offer  CKD3a Baseline Cr 1.5-1.8. Cr today 1.4 -Continue to monitor   Permanent Afib on Eliquis Prior CVA Rate controlled without medications -Continue Eliquis 2.5 mg BID   Seizures Followed by neurology.  No recent seizure activity. -Continue Keppra 750 mg   Left MCA CVA HLD  -Continue Crestor 20 mg -Continue Zetia 10 mg   DM Diet controlled.  -Continue monitoring blood glucose   Diet: Heart Healthy VTE: Eliquis Code: Full   Dispo: Anticipated discharge to Skilled nursing facility in 1 days pending clinical management of HF exacerbation with IV diuresis today.   Romana Juniper, MD Internal Medicine Resident PGY-1 Please contact the on call pager after 5 pm and on weekends at [phone].

## 2022-03-06 NOTE — Progress Notes (Signed)
Shift Summary 0700-1900 Pt is A/Ox4 but forgetful at times, easily redirectable. Pt had good appetite and ate >75% for all meals. No complaints of pain. Pt has some right leg swelling, slightly red, no discomfort to touch. MD made aware of previous findings and came to assess, no concerns on their end.

## 2022-03-06 NOTE — NC FL2 (Signed)
[place] LEVEL OF CARE FORM     IDENTIFICATION  Patient Name: Edwin Wall Birthdate: 1940/11/10 Sex: male Admission Date (Current Location): 03/05/2022  [facility] and [place] Number:  Herbalist and Address:  [facility], [place] [address], [place], [place] [address]      Provider Number: [id number]  Attending Physician Name and Address:  [name], MD  Relative Name and Phone Number:       Current Level of Care: Hospital Recommended Level of Care: [place] Prior Approval Number:    Date Approved/Denied:   PASRR Number: [id number]  Discharge Plan: SNF    Current Diagnoses: Patient Active Problem List   Diagnosis Date Noted   Recurrent falls 03/06/2022   COVID-19 03/05/2022   Physical deconditioning 03/05/2022   Non-traumatic rhabdomyolysis 02/28/2022   Acute on chronic heart failure ([place]) 02/25/2022   Pneumonia due to COVID-19 virus 02/25/2022   Bilateral leg edema 04/18/2021   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[place]) 04/18/2021   AMS (altered mental status) 04/14/2021   Syncope and collapse 03/11/2021   Abnormal nuclear stress test 03/11/2021   Protein-calorie malnutrition, severe 01/18/2021   Orthostatic hypotension    Fall    Weight loss    Secondary hypercoagulable state ([place]) 01/02/2021   LOC (loss of consciousness) ([place]) 12/23/2020   Abnormality of gait 09/10/2020   Leukocytosis    Somnolence    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/2020   Acute right hemiparesis ([place]) 07/19/2020   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   AKI (acute kidney injury) ([place])    Prediabetes    PAF (paroxysmal atrial fibrillation) ([place])    Dysphagia    Hyperlipidemia 07/16/2020   Type II diabetes mellitus ([place]) 07/16/2020   Atrial fibrillation ([place]) 07/16/2020   Acute CVA (cerebrovascular accident) ([place]) 07/15/2020   Cancer of sigmoid colon ([place]) 12/17/2016   Obesity (BMI 30-39.9) 01/15/2011   Hypertension  01/15/2011   History of gout 01/15/2011   ED (erectile dysfunction) 01/15/2011    Orientation RESPIRATION BLADDER Height & Weight     Self, Situation  Normal Continent, External catheter Weight: 200 lb (90.7 kg) Height:  5' 8.5" (174 cm)  BEHAVIORAL SYMPTOMS/MOOD NEUROLOGICAL BOWEL NUTRITION STATUS      Continent Diet  AMBULATORY STATUS COMMUNICATION OF NEEDS Skin   Limited Assist Verbally Normal                       Personal Care Assistance Level of Assistance  Bathing, Dressing, Feeding Bathing Assistance: Limited assistance Feeding assistance: Limited assistance Dressing Assistance: Limited assistance     Functional Limitations Info  Sight, Hearing, Speech Sight Info: Adequate Hearing Info: Adequate Speech Info: Adequate    SPECIAL CARE FACTORS FREQUENCY  OT (By licensed OT), PT (By licensed PT)     PT Frequency: 5x weekly OT Frequency: 5x weekly            Contractures Contractures Info: Not present    Additional Factors Info  Code Status, Allergies Code Status Info: Full Allergies Info: No known allergies           Current Medications (03/06/2022):  This is the current hospital active medication list Current Facility-Administered Medications  Medication Dose Route Frequency Provider Last Rate Last Admin   apixaban (ELIQUIS) tablet 2.5 mg  2.5 mg Oral BID Delene Ruffini, MD   2.5 mg at 03/06/22 0824   ezetimibe (ZETIA) tablet 10 mg  10 mg Oral Daily  Delene Ruffini, MD   10 mg at 03/06/22 2355   furosemide (LASIX) tablet 20 mg  20 mg Oral Dayna Barker, MD   20 mg at 03/05/22 2355   guaiFENesin (MUCINEX) 12 hr tablet 600 mg  600 mg Oral BID Romana Juniper, MD   600 mg at 03/06/22 0824   levETIRAcetam (KEPPRA) tablet 750 mg  750 mg Oral BID Delene Ruffini, MD   750 mg at 03/06/22 7322   rosuvastatin (CRESTOR) tablet 40 mg  40 mg Oral Daily Delene Ruffini, MD   40 mg at 03/06/22 0254     Discharge Medications: Please  see discharge summary for a list of discharge medications.  Relevant Imaging Results:  Relevant Lab Results:   Additional Information SSN: [id number]  Archie Endo, LCSW

## 2022-03-06 NOTE — Evaluation (Signed)
Occupational Therapy Evaluation Patient Details Name: Edwin Wall MRN: [id number] DOB: Oct 22, 1940 Today's Date: 03/06/2022   History of Present Illness 82 yo male admitted 1/3 after a fall at home. Pt admitted 12/26-12/30 for fall (on floor 3 days) and Covid + with SNF recommend but family took him home. PMH: CVA, HFrEF, Afib, HLD, seizures, CKD   Clinical Impression   Pt with BLE elevated in chair and slid down in chair with bottom nearly out of seat of chair on arrival. Mod A and max multimodal cues for problem solving to bring bottom back into chair safely prior to initiation of session. Pt and wife report that since returning home, pt with relatively sedentary habits and continued weakness. Upon eval, pt endorses weakness, decreased balance, activity tolerance, and cognition. Pt performing UB ADL with set-up and LB ADL with min guard-min A. Pt with minor word finding difficulties making cognition difficult to assess. Pt performing functional mobility with RW with min guard A and intermittent min A for balance. Performing hand hygiene at sink with min guard A. Pt and wife both agreeable to further rehabilitation to optimize strength, balance and safety during ADL prior to return home. Will continue to follow acutely.      Recommendations for follow up therapy are one component of a multi-disciplinary discharge planning process, led by the attending physician.  Recommendations may be updated based on patient status, additional functional criteria and insurance authorization.   Follow Up Recommendations  Skilled nursing-short term rehab (<3 hours/day)     Assistance Recommended at Discharge Frequent or constant Supervision/Assistance  Patient can return home with the following A little help with walking and/or transfers;A lot of help with bathing/dressing/bathroom    Functional Status Assessment  Patient has had a recent decline in their functional status and demonstrates the ability  to make significant improvements in function in a reasonable and predictable amount of time.  Equipment Recommendations  None recommended by OT    Recommendations for Other Services       Precautions / Restrictions Precautions Precautions: Fall;Other (comment) Precaution Comments: incontinent BM Restrictions Weight Bearing Restrictions: No      Mobility Bed Mobility               General bed mobility comments: In recliner on arrival and departure    Transfers Overall transfer level: Needs assistance Equipment used: Rolling walker (2 wheels) Transfers: Sit to/from Stand, Bed to chair/wheelchair/BSC Sit to Stand: Min assist           General transfer comment: cues for hand placement and safety      Balance Overall balance assessment: Needs assistance, History of Falls Sitting-balance support: Feet supported Sitting balance-Leahy Scale: Fair     Standing balance support: During functional activity, Bilateral upper extremity supported Standing balance-Leahy Scale: Poor Standing balance comment: bil UE on RW                           ADL either performed or assessed with clinical judgement   ADL Overall ADL's : Needs assistance/impaired Eating/Feeding: Set up Eating/Feeding Details (indicate cue type and reason): eating at end of session; OT providing set up to open packets/containers Grooming: Min guard;Standing;Wash/dry hands Grooming Details (indicate cue type and reason): cues for use of soap Upper Body Bathing: Set up;Sitting   Lower Body Bathing: Sit to/from stand;Min guard   Upper Body Dressing : Set up;Sitting   Lower Body Dressing: Min guard;Sit to/from stand  Lower Body Dressing Details (indicate cue type and reason): donning/doffing socks from chair level Toilet Transfer: Min guard;Ambulation;Rolling walker (2 wheels);Comfort height toilet Toilet Transfer Details (indicate cue type and reason): simulated from recliner          Functional mobility during ADLs: Min guard;Rolling walker (2 wheels) General ADL Comments: limited by balance and weakness     Vision Baseline Vision/History: 1 Wears glasses Ability to See in Adequate Light: 0 Adequate Patient Visual Report: No change from baseline Vision Assessment?: No apparent visual deficits Additional Comments: cues to locate items at sink, but believe due to cog and decreased familiarity with environment     Perception     Praxis      Pertinent Vitals/Pain Pain Assessment Pain Assessment: Faces Faces Pain Scale: Hurts little more Pain Location: bil feet Pain Descriptors / Indicators: Aching Pain Intervention(s): Limited activity within patient's tolerance, Monitored during session     Hand Dominance     Extremity/Trunk Assessment Upper Extremity Assessment Upper Extremity Assessment: Generalized weakness   Lower Extremity Assessment Lower Extremity Assessment: Generalized weakness   Cervical / Trunk Assessment Cervical / Trunk Assessment: Kyphotic   Communication Communication Communication: Expressive difficulties (Intermittent difficulty word finding)   Cognition Arousal/Alertness: Awake/alert Behavior During Therapy: Flat affect, WFL for tasks assessed/performed Overall Cognitive Status: Impaired/Different from baseline Area of Impairment: Problem solving, Awareness, Safety/judgement, Following commands, Attention, Orientation                 Orientation Level: Disoriented to, Time (day of week and date) Current Attention Level: Sustained Memory: Decreased short-term memory Following Commands: Follows one step commands with increased time Safety/Judgement: Decreased awareness of deficits, Decreased awareness of safety Awareness: Emergent Problem Solving: Slow processing, Requires verbal cues General Comments: slow processing and requiring incrased time to follow commands througout, Requiring verbal cues to follow simple two step  commands. Pt with some awareness of deficits as indicated by agreeable to rehabilitation prior to home and attampts to step back toward chair to incr balance with chair touching calves  while attempting to get BP. Very pleasant and conversational     General Comments       Exercises     Shoulder Instructions      Home Living Family/patient expects to be discharged to:: Private residence Living Arrangements: Spouse/significant other;Children Available Help at Discharge: Family;Available 24 hours/day Type of Home: House Home Access: Stairs to enter [employer] of Steps: 5 in front of home and 2 in back of home Entrance Stairs-Rails: Right;Left;Can reach both Home Layout: One level     Bathroom Shower/Tub: Tub/shower unit;Walk-in shower;Door   Bathroom Toilet: Handicapped height     Home Equipment: [place] - single point;Shower Land (2 wheels);Rollator (4 wheels);BSC/3in1          Prior Functioning/Environment Prior Level of Function : Independent/Modified Independent             Mobility Comments: SPC at times prior to being sick. Since then RW and limited gait ADLs Comments: Recently sedentary, but was performing ADL with mod I and wife assisting with IADL.        OT Problem List: Decreased strength;Decreased activity tolerance;Impaired balance (sitting and/or standing);Decreased safety awareness;Decreased knowledge of use of DME or AE      OT Treatment/Interventions: Self-care/ADL training;Therapeutic exercise;Energy conservation;DME and/or AE instruction;Therapeutic activities    OT Goals(Current goals can be found in the care plan section) Acute Rehab OT Goals Patient Stated Goal: get stronger OT Goal Formulation: With  patient Time For Goal Achievement: 03/20/22 Potential to Achieve Goals: Good  OT Frequency: Min 2X/week    Co-evaluation              AM-PAC OT "6 Clicks" Daily Activity     Outcome Measure Help from another  person eating meals?: None Help from another person taking care of personal grooming?: A Little Help from another person toileting, which includes using toliet, bedpan, or urinal?: A Lot Help from another person bathing (including washing, rinsing, drying)?: A Little Help from another person to put on and taking off regular upper body clothing?: A Little Help from another person to put on and taking off regular lower body clothing?: A Little 6 Click Score: 18   End of Session Equipment Utilized During Treatment: Gait belt;Rolling walker (2 wheels) Nurse Communication: Mobility status  Activity Tolerance: Patient tolerated treatment well Patient left: in chair;with call bell/phone within reach;with chair alarm set;with family/visitor present  OT Visit Diagnosis: Unsteadiness on feet (R26.81);Other abnormalities of gait and mobility (R26.89);Muscle weakness (generalized) (M62.81)                Time: 0225-0257 OT Time Calculation (min): 32 min Charges:  OT General Charges $OT Visit: 1 Visit OT Evaluation $OT Eval Moderate Complexity: 1 Mod OT Treatments $Self Care/Home Management : 8-22 mins  Elder Cyphers, OTR/L [facility] Acute Rehabilitation Office: [phone]   Magnus Ivan 03/06/2022, 3:20 PM

## 2022-03-06 NOTE — Progress Notes (Signed)
Per MD, patient and family are agreeable to STR at SNF.  CSW completed FL2 and faxed patient's clinical information out for review to obtain bed offers.  Madilyn Fireman, MSW, LCSW Transitions of Care  Clinical Social Worker II [phone]

## 2022-03-06 NOTE — Progress Notes (Signed)
SATURATION QUALIFICATIONS: (This note is used to comply with regulatory documentation for home oxygen)  Patient Saturations on Room Air at Rest = 94%  Patient Saturations on Room Air while Ambulating = 93%  Patient Saturations on 0 Liters of oxygen while Ambulating = [name]  Please briefly explain why patient needs home oxygen: This Pt did not require any oxygen while ambulating nor felt any shortness of breath.

## 2022-03-06 NOTE — Evaluation (Signed)
Physical Therapy Evaluation Patient Details Name: Edwin Wall MRN: [id number] DOB: 1940-06-16 Today's Date: 03/06/2022  History of Present Illness  82 yo male admitted 1/3 after a fall at home. Pt admitted 12/26-12/30 for fall (on floor 3 days) and Covid + with SNF recommend but family took him home. PMH: CVA, HFrEF, Afib, HLD, seizures, CKD  Clinical Impression  Pt pleasant and reports sliding out of chair at home but denies other changes. Pt does endorse weakness and unable to walk without assist. Pt states SNF needed in order to return to functional baseline. Pt with decreased strength, safety and function who will benefit from acute therapy to maximize mobility, safety and independence.   Supine 106/64, HR 98 Sitting 96/62, HR 73 Standing 91/59, HR 70  SPO2 93-95% on RA     Recommendations for follow up therapy are one component of a multi-disciplinary discharge planning process, led by the attending physician.  Recommendations may be updated based on patient status, additional functional criteria and insurance authorization.  Follow Up Recommendations Skilled nursing-short term rehab (<3 hours/day) Can patient physically be transported by private vehicle: Yes    Assistance Recommended at Discharge Frequent or constant Supervision/Assistance  Patient can return home with the following  Assistance with cooking/housework;Direct supervision/assist for medications management;Direct supervision/assist for financial management;Assist for transportation;Help with stairs or ramp for entrance;A little help with walking and/or transfers;A little help with bathing/dressing/bathroom    Equipment Recommendations None recommended by PT  Recommendations for Other Services       Functional Status Assessment Patient has had a recent decline in their functional status and demonstrates the ability to make significant improvements in function in a reasonable and predictable amount of time.      Precautions / Restrictions Precautions Precautions: Fall;Other (comment) Precaution Comments: incontinent BM      Mobility  Bed Mobility Overal bed mobility: Needs Assistance Bed Mobility: Supine to Sit     Supine to sit: Min assist     General bed mobility comments: use of rail, HHA to elevate trunk, increased time    Transfers Overall transfer level: Needs assistance   Transfers: Sit to/from Stand, Bed to chair/wheelchair/BSC Sit to Stand: Min assist Stand pivot transfers: Min assist Step pivot transfers: Min assist       General transfer comment: cues for hand placement, safety and sequence. Repeated sit to stand x 5 due to continued need for BM with each stand from [facility]. stand pivot bed to [facility] and step pivot BSC to chair    Ambulation/Gait                  Stairs            Wheelchair Mobility    Modified Rankin (Stroke Patients Only)       Balance Overall balance assessment: Needs assistance, History of Falls   Sitting balance-Leahy Scale: Fair     Standing balance support: During functional activity, Bilateral upper extremity supported Standing balance-Leahy Scale: Poor Standing balance comment: bil UE on RW                             Pertinent Vitals/Pain Pain Assessment Pain Assessment: No/denies pain    Home Living Family/patient expects to be discharged to:: Private residence Living Arrangements: Spouse/significant other;Children Available Help at Discharge: Family;Available 24 hours/day Type of Home: House Home Access: Stairs to enter Entrance Stairs-Rails: Right;Left;Can reach both Entrance Stairs-Number of Steps: 5 in  front of home and 2 in back of home   Home Layout: One level Home Equipment: [place] - single point;[place]ower [place] (2 wheels);Rollator (4 wheels);BSC/3in1      Prior Function Prior Level of Function : Independent/Modified Independent             Mobility Comments: SPC at  times prior to being sick. Since then RW and limited gait       Hand Dominance        Extremity/Trunk Assessment   Upper Extremity Assessment Upper Extremity Assessment: Generalized weakness    Lower Extremity Assessment Lower Extremity Assessment: Generalized weakness    Cervical / Trunk Assessment Cervical / Trunk Assessment: Kyphotic  Communication   Communication: No difficulties  Cognition Arousal/Alertness: Awake/alert Behavior During Therapy: Flat affect Overall Cognitive Status: Impaired/Different from baseline Area of Impairment: Problem solving, Awareness, Safety/judgement, Following commands, Attention, Orientation                 Orientation Level: Disoriented to, Time Current Attention Level: Sustained Memory: Decreased short-term memory Following Commands: Follows one step commands with increased time Safety/Judgement: Decreased awareness of deficits, Decreased awareness of safety   Problem Solving: Slow processing General Comments: pt incontinent of stool on arrival and unaware, slow processing with increased cues        General Comments      Exercises     Assessment/Plan    PT Assessment Patient needs continued PT services  PT Problem List Decreased strength;Decreased activity tolerance;Decreased balance;Decreased mobility;Decreased cognition;Decreased knowledge of use of DME;Decreased safety awareness;Cardiopulmonary status limiting activity       PT Treatment Interventions DME instruction;Gait training;Stair training;Functional mobility training;Therapeutic activities;Therapeutic exercise;Balance training;Neuromuscular re-education;Patient/family education    PT Goals (Current goals can be found in the Care Plan section)  Acute Rehab PT Goals Patient Stated Goal: to return home PT Goal Formulation: With patient Time For Goal Achievement: 03/20/22 Potential to Achieve Goals: Fair    Frequency Min 2X/week     Co-evaluation                AM-PAC PT "6 Clicks" Mobility  Outcome Measure Help needed turning from your back to your side while in a flat bed without using bedrails?: A Little Help needed moving from lying on your back to sitting on the side of a flat bed without using bedrails?: A Little Help needed moving to and from a bed to a chair (including a wheelchair)?: A Little Help needed standing up from a chair using your arms (e.g., wheelchair or bedside chair)?: A Little Help needed to walk in hospital room?: A Lot Help needed climbing 3-5 steps with a railing? : Total 6 Click Score: 15    End of Session Equipment Utilized During Treatment: Gait belt Activity Tolerance: Patient tolerated treatment well Patient left: in chair;with call bell/phone within reach;with chair alarm set Nurse Communication: Mobility status PT Visit Diagnosis: Muscle weakness (generalized) (M62.81);Other abnormalities of gait and mobility (R26.89);History of falling (Z91.81)    Time: 1100-1133 PT Time Calculation (min) (ACUTE ONLY): 33 min   Charges:   PT Evaluation $PT Eval Moderate Complexity: 1 Mod PT Treatments $Therapeutic Activity: 8-22 mins        Bayard Males, PT Acute Rehabilitation Services Office: [phone]   Sandy Salaam Tykisha Areola 03/06/2022, 11:44 AM

## 2022-03-07 ENCOUNTER — Encounter (HOSPITAL_COMMUNITY): Payer: Self-pay | Admitting: Internal Medicine

## 2022-03-07 DIAGNOSIS — R296 Repeated falls: Secondary | ICD-10-CM | POA: Diagnosis not present

## 2022-03-07 DIAGNOSIS — U071 COVID-19: Secondary | ICD-10-CM | POA: Diagnosis not present

## 2022-03-07 DIAGNOSIS — J1282 Pneumonia due to coronavirus disease 2019: Secondary | ICD-10-CM | POA: Diagnosis not present

## 2022-03-07 DIAGNOSIS — R5381 Other malaise: Secondary | ICD-10-CM | POA: Diagnosis not present

## 2022-03-07 NOTE — Progress Notes (Signed)
Mobility Specialist Progress Note:   03/07/22 1045  Mobility  Activity Transferred from bed to chair;Stood at bedside  Level of Assistance Contact guard assist, steadying assist  Assistive Device None  Activity Response Tolerated well  Mobility Referral Yes  $Mobility charge 1 Mobility   Pt received in bed and agreeable. Independent with bed mobility and CG to stand EOB/ stay standing. Stood for ~2 minutes before sitting in chair. No complaints. Pt left in chair with all needs met, call bell in reach, and chair alarm on.   Edwin Wall Mobility Specialist Please contact via SecureChat or  Rehab office at [phone]

## 2022-03-07 NOTE — Progress Notes (Signed)
Hospital day#2 Subjective:  Overnight Events: None   Patient slept well and doing well this AM. Has had breakfast. Denies chest pain, shob, n/v. Patient denies generalized pain, discomfort with urination, or BM. Discussed his clinical improvement and that we are awaiting SNF placement for Rehab needs. Patient in agreement with plan.   Objective:  Vital signs in last 24 hours: Vitals:   03/06/22 1621 03/06/22 2105 03/07/22 0608 03/07/22 0838  BP: 120/82 123/67 118/83 108/71  Pulse: 83 67 92 66  Resp: '16 20 19 16  '$ Temp: 98.1 F (36.7 C) (!) 97.3 F (36.3 C) 98 F (36.7 C) 97.9 F (36.6 C)  TempSrc: Oral Oral Oral Oral  SpO2: 100% 99% 98% 97%  Weight:      Height:       Supplemental O2: Room Air SpO2: 97 % Filed Weights   03/05/22 0957  Weight: 90.7 kg    Physical Exam:  Constitutional:Chronically ill-appearing man sitting in bed, in no acute distress HENT: normocephalic atraumatic, moist mucous membranes Neck: supple, no JVD Cardiovascular: irregular rhythm, no m/r/g Pulmonary/Chest: normal work of breathing on room air, lungs clear to auscultation bilaterally. No wheezing Abdominal: Normal BS, soft, non-tender, non-distended. MSK: normal bulk and tone. trace pitting edema in bilateral lower extremities Neurological: alert & oriented to self, month, situation Skin: warm and dry Psych: Pleasant mood and affect    Intake/Output Summary (Last 24 hours) at 03/07/2022 1142 Last data filed at 03/06/2022 2010 Gross per 24 hour  Intake 836 ml  Output 1200 ml  Net -364 ml   Net IO Since Admission: -464 mL [03/07/22 1142]  Pertinent Labs:    Latest Ref Rng & Units 03/06/2022    1:28 AM 03/05/2022    3:40 PM 02/28/2022   12:59 AM  CBC  WBC 4.0 - 10.5 K/uL 6.5  7.3  3.8   Hemoglobin 13.0 - 17.0 g/dL 13.8  15.4  12.3   Hematocrit 39.0 - 52.0 % 43.6  46.7  36.7   Platelets 150 - 400 K/uL 199  214  125        Latest Ref Rng & Units 03/06/2022    1:28 AM 03/05/2022     3:40 PM 02/28/2022   12:59 AM  CMP  Glucose 70 - 99 mg/dL 119  106  99   BUN 8 - 23 mg/dL '26  21  30   '$ Creatinine 0.61 - 1.24 mg/dL 1.47  1.36  1.59   Sodium 135 - 145 mmol/L 141  144  140   Potassium 3.5 - 5.1 mmol/L 4.5  3.8  4.4   Chloride 98 - 111 mmol/L 107  106  107   CO2 22 - 32 mmol/L '27  29  23   '$ Calcium 8.9 - 10.3 mg/dL 8.7  8.9  8.3   Total Protein 6.5 - 8.1 g/dL  6.2    Total Bilirubin 0.3 - 1.2 mg/dL  1.7    Alkaline Phos 38 - 126 U/L  93    AST 15 - 41 U/L  51    ALT 0 - 44 U/L  78      Imaging: No results found.  Assessment/Plan:   Principal Problem:   Physical deconditioning Active Problems:   COVID-19   Recurrent falls   Patient Summary: Edwin Wall is a 82 y.o. with a pertinent PMH of CVA, HFrEF, CKD3a, Afib on Eliquis, HLD, seizures who presented after a fall secondary to medical deconditioning 2/2 to recent  COVID PNA admission  and admitted for weakness for same and mild heart failure exacerbation, now improved s/p IV lasix and medically stable for discharge   HFrEF TTE with EF 45-50% on 12/26. Patient is on Lasix 20 mg every other day.  Patient's breathing effort is normal, with good saturations on RA.  No evidence of volume overload today. Will plan to resume home furosemide dosing, otherwise patient is clinically stable for discharge. -Continue Furosemide 20 mg every other day  Falls Physical deconditioning 2/2 COVID Pneumonia Patient returning to ED with weakness c/b by falls at home after being discharged from hospital 2/2 to deconditioning after 02/25/2022 hospitalization. Continues to be subjectively weak. Mild cough improving.  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  No recent seizure activity. -Continue Keppra 750 mg   Left MCA CVA HLD  -Continue Crestor 20 mg -Continue Zetia 10 mg   DM Diet controlled.  -Continue monitoring blood glucose   Diet: Heart Healthy VTE: Eliquis Code: Full Family update: Mrs. Wenger at 11:52 AM over the phone - Patient's wife is still deciding on SNF for discharge. Mrs. Harmon is working with Education officer, museum  Dispo: Anticipated discharge to Skilled nursing facility in 1 days pending SNF bed offer; otherwise clinically stable for discharge  Romana Juniper, MD Internal Medicine Resident PGY-1 Please contact the on call pager after 5 pm and on weekends at [phone].

## 2022-03-07 NOTE — TOC Progression Note (Addendum)
Transition of Care [facility]) - Progression Note    Patient Details  Name: Edwin Wall MRN: [id number] Date of Birth: 10/12/1940  Transition of Care [facility]) CM/SW Vil[place], RN Phone Number: 03/07/2022, 2:23 PM  Clinical Narrative:    CM met with the patient and the wife at the bedside to discuss SNF options.  [facility] and [facility] are unable to offer admission bed to the patient.  The patient does not have an Inpatient qualifying stay for 3 days under Medicare at this time and the patient does not have available bed offers that the patient and wife are agreeable to the patient to admit.  The wife states that she is agreeable to take the patient home with home health services once he is able to mobilize safely enough for her to care for him at the home through home health services.  I will updated the attending physician on the patient's family's decision.  The patient has DME at the home including raised toilet seat, RW and rolator at the home at this time.  The patient's wife was given Medicare choice regarding home health services and she does not have a preference.  [facility] the patient for home health services including RN, PT, OT and aide.  CM will continue to follow the patient for return to home with home health services once cleared with MD attending and therapy since SNF is not option per family choice.  03/07/2021 1451 - I called Kitty, CM at [facility] and since the patient's initial COVID swab was 02/25/2022 the patient would be able to admit to [facility] on Monday with an available bed and 3 days of inpatient Medicare stay.  I spoke with the wife and patient and they are agreeable to SNF admission to [facility] on Monday.  Kitty, CM will reach out to the wife for admission paperwork.       Expected Discharge Plan and Services                                               Social Determinants of Health (SDOH)  Interventions SDOH Screenings   Food Insecurity: No Food Insecurity (03/06/2022)  Housing: Low Risk  (03/06/2022)  Transportation Needs: No Transportation Needs (03/06/2022)  Utilities: Not At Risk (03/06/2022)  Depression (PHQ2-9): Low Risk  (09/10/2020)  Tobacco Use: Low Risk  (02/26/2022)    Readmission Risk Interventions     No data to display

## 2022-03-07 NOTE — Progress Notes (Signed)
CSW spoke with patient's wife [name] to present her with bed offers from [facility] and [facility]. [name] was not wiling to accept either bed offer as those facilities have one star ratings.  CSW will speak with admissions at [facility] to determine if they can accept the patient.  Madilyn Fireman, MSW, LCSW Transitions of Care  Clinical Social Worker II [phone]

## 2022-03-08 DIAGNOSIS — U071 COVID-19: Secondary | ICD-10-CM | POA: Diagnosis not present

## 2022-03-08 DIAGNOSIS — R296 Repeated falls: Secondary | ICD-10-CM | POA: Diagnosis not present

## 2022-03-08 DIAGNOSIS — J1282 Pneumonia due to coronavirus disease 2019: Secondary | ICD-10-CM | POA: Diagnosis not present

## 2022-03-08 DIAGNOSIS — R5381 Other malaise: Secondary | ICD-10-CM | POA: Diagnosis not present

## 2022-03-08 NOTE — Plan of Care (Signed)

## 2022-03-08 NOTE — Progress Notes (Signed)
Hospital day#3 Subjective:  Overnight Events: None  Patient doing well this morning with no complaints. Eating breakfast.   Objective:  Vital signs in last 24 hours: Vitals:   03/07/22 0838 03/07/22 2051 03/08/22 0646 03/08/22 0759  BP: 108/71 104/71 110/75 102/78  Pulse: 66 74 65 73  Resp: '16 20 20 18  '$ Temp: 97.9 F (36.6 C) 99.1 F (37.3 C) 98.3 F (36.8 C) 98 F (36.7 C)  TempSrc: Oral Oral Oral Oral  SpO2: 97% 98% 95% 95%  Weight:      Height:       Supplemental O2: Room Air SpO2: 95 % O2 Flow Rate (L/min): 0 L/min Filed Weights   03/05/22 0957  Weight: 90.7 kg    Physical Exam:  Constitutional:Chronically ill-appearing man sitting in bed, in no acute distress Cardiovascular: irregular rhythm, no m/r/g Pulmonary/Chest: normal work of breathing on room air, lungs clear to auscultation bilaterally. No wheezing Abdominal: Normal BS, soft, non-tender, non-distended. Neurological: alert & oriented to self, month, situation Skin: warm and dry Psych: Pleasant mood and affect    Intake/Output Summary (Last 24 hours) at 03/08/2022 1305 Last data filed at 03/08/2022 0524 Gross per 24 hour  Intake 236 ml  Output 800 ml  Net -564 ml   Net IO Since Admission: -1,028 mL [03/08/22 1305]  Pertinent Labs:    Latest Ref Rng & Units 03/06/2022    1:28 AM 03/05/2022    3:40 PM 02/28/2022   12:59 AM  CBC  WBC 4.0 - 10.5 K/uL 6.5  7.3  3.8   Hemoglobin 13.0 - 17.0 g/dL 13.8  15.4  12.3   Hematocrit 39.0 - 52.0 % 43.6  46.7  36.7   Platelets 150 - 400 K/uL 199  214  125        Latest Ref Rng & Units 03/06/2022    1:28 AM 03/05/2022    3:40 PM 02/28/2022   12:59 AM  CMP  Glucose 70 - 99 mg/dL 119  106  99   BUN 8 - 23 mg/dL '26  21  30   '$ Creatinine 0.61 - 1.24 mg/dL 1.47  1.36  1.59   Sodium 135 - 145 mmol/L 141  144  140   Potassium 3.5 - 5.1 mmol/L 4.5  3.8  4.4   Chloride 98 - 111 mmol/L 107  106  107   CO2 22 - 32 mmol/L '27  29  23   '$ Calcium 8.9 - 10.3 mg/dL 8.7   8.9  8.3   Total Protein 6.5 - 8.1 g/dL  6.2    Total Bilirubin 0.3 - 1.2 mg/dL  1.7    Alkaline Phos 38 - 126 U/L  93    AST 15 - 41 U/L  51    ALT 0 - 44 U/L  78     Assessment/Plan:   Principal Problem:   Physical deconditioning Active Problems:   COVID-19   Recurrent falls   Patient Summary: Edwin Wall is a 82 y.o. with a pertinent PMH of CVA, HFrEF, CKD3a, Afib on Eliquis, HLD, seizures who presented after a fall secondary to medical deconditioning 2/2 to recent COVID PNA admission  and admitted for weakness for same and mild heart failure exacerbation, now improved s/p IV lasix and medically stable for discharge  Falls Physical deconditioning 2/2 COVID Pneumonia Patient was re-admitted after recent discharge for fall. He was discharged home but immediately came back. Patient remains deconditioned and is fall risk. Patient is not  a safe discharge to home as he does not have adequate support at home and requires SNF level care. PT/OT are continuing to work with him during admission. He is otherwise medically stable and ready for discharge to SNF pending bed offer/insurance authorization. NOT on COVID precautions anymore. -Conservative management for cough -Physical therapy evaluation: recommend SNF, awaiting bed offer  HFrEF TTE with EF 45-50% on 12/26. Patient is on Lasix 20 mg every other day.  Patient's breathing effort is normal, with good saturations on RA.  No evidence of volume overload today. Home furosemide dosing, otherwise patient is clinically stable for discharge. -Continue Furosemide 20 mg every other day  CKD3a Baseline Cr 1.5-1.8.  -Continue to monitor   Permanent Afib on Eliquis Prior CVA Rate controlled without medications -Continue Eliquis 2.5 mg BID   Seizures Followed by neurology.  No recent seizure activity. -Continue Keppra 750 mg   Left MCA CVA HLD  -Continue Crestor 20 mg -Continue Zetia 10 mg   DM Diet controlled.  -Continue  monitoring blood glucose   Diet: Heart Healthy VTE: Eliquis Code: Full Dispo: Anticipated discharge to Skilled nursing facility in 1 days pending SNF bed offer; otherwise clinically stable for discharge  Delene Ruffini, MD

## 2022-03-09 ENCOUNTER — Encounter (HOSPITAL_COMMUNITY): Payer: Self-pay | Admitting: Internal Medicine

## 2022-03-09 NOTE — Progress Notes (Signed)
Hospital day#4 Subjective:  Overnight Events: None Patient seated in chair, eating breakfast and watching TV in good spirits as today is his 82  wedding anniversary with wife. Otherwise, denies shortness of breath, chest pain, nausea, vomiting, swollen feet, or generalized/localized pain  Objective:  Vital signs in last 24 hours: Vitals:   03/08/22 1740 03/08/22 2112 03/09/22 0507 03/09/22 0746  BP: 102/69 127/70 114/76 114/78  Pulse: 91 (!) 44 83 63  Resp: '18 18 16 16  '$ Temp: (!) 97.5 F (36.4 C) 98 F (36.7 C) 98 F (36.7 C) 98 F (36.7 C)  TempSrc: Oral Oral  Oral  SpO2: 97% 99% 96% 97%  Weight:      Height:       Supplemental O2: Room Air SpO2: 97 % O2 Flow Rate (L/min): 0 L/min Filed Weights   03/05/22 0957  Weight: 90.7 kg    Physical Exam:  Constitutional:Chronically ill-appearing man sitting in bed, in no acute distress Cardiovascular: irregular rhythm, no m/r/g Pulmonary/Chest: Clear to auscultation bilaterally. No wheezing Abdominal: Normal BS, soft, non-tender, non-distended. Neurological: alert & oriented to self, month, situation MSK: moving all extremities. Trace edema on LE Skin: warm and dry; no discoloration or ecchymoses noted. Psych: Pleasant mood and affect    Intake/Output Summary (Last 24 hours) at 03/09/2022 1136 Last data filed at 03/08/2022 2202 Gross per 24 hour  Intake --  Output 600 ml  Net -600 ml   Net IO Since Admission: -1,628 mL [03/09/22 1136]  Pertinent Labs:    Latest Ref Rng & Units 03/06/2022    1:28 AM 03/05/2022    3:40 PM 02/28/2022   12:59 AM  CBC  WBC 4.0 - 10.5 K/uL 6.5  7.3  3.8   Hemoglobin 13.0 - 17.0 g/dL 13.8  15.4  12.3   Hematocrit 39.0 - 52.0 % 43.6  46.7  36.7   Platelets 150 - 400 K/uL 199  214  125        Latest Ref Rng & Units 03/06/2022    1:28 AM 03/05/2022    3:40 PM 02/28/2022   12:59 AM  CMP  Glucose 70 - 99 mg/dL 119  106  99   BUN 8 - 23 mg/dL '26  21  30   '$ Creatinine 0.61 - 1.24 mg/dL 1.47   1.36  1.59   Sodium 135 - 145 mmol/L 141  144  140   Potassium 3.5 - 5.1 mmol/L 4.5  3.8  4.4   Chloride 98 - 111 mmol/L 107  106  107   CO2 22 - 32 mmol/L '27  29  23   '$ Calcium 8.9 - 10.3 mg/dL 8.7  8.9  8.3   Total Protein 6.5 - 8.1 g/dL  6.2    Total Bilirubin 0.3 - 1.2 mg/dL  1.7    Alkaline Phos 38 - 126 U/L  93    AST 15 - 41 U/L  51    ALT 0 - 44 U/L  78     Assessment/Plan:   Principal Problem:   Physical deconditioning Active Problems:   COVID-19   Recurrent falls   Patient Summary: Edwin Wall is a 82 y.o. with a pertinent PMH of CVA, HFrEF, CKD3a, Afib on Eliquis, HLD, seizures who presented after a fall secondary to medical deconditioning 2/2 to recent COVID PNA admission  and admitted for weakness for same and mild heart failure exacerbation, now improved s/p IV lasix and medically stable for discharge awaiting SNF placement  Falls Physical deconditioning 2/2 COVID Pneumonia Patient was re-admitted after recent hospitalization for COVID-19 pneumonia. He was discharged home; he remains deconditioned and at high risk for falls. Patient is not a safe discharge to home as he does not have adequate support. PT/OT are continuing to work with him during admission and recommend SNF.  He has been accepted at [facility] but won't be able to be transferred until Monday 1/8. -NOT on COVID precautions anymore. -Conservative management for cough -Physical therapy evaluation: recommend SNF, awaiting bed offer  HFrEF TTE with EF 45-50% on 12/26. Patient is on Lasix 20 mg every other day.  Continued to bt CTAB, with good oxygen saturations on RA. No evidence of volume overload today. Home furosemide dosing, otherwise patient is clinically stable for discharge. -Continue Furosemide 20 mg every other day  CKD3a Baseline Cr 1.5-1.8.  -Continue to monitor   Permanent Afib on Eliquis Prior CVA Rate controlled without medications -Continue Eliquis 2.5 mg BID    Seizures Followed by neurology.  No recent seizure activity. -Continue Keppra 750 mg   Left MCA CVA HLD  -Continue Crestor 20 mg -Continue Zetia 10 mg   DM Diet controlled.  -Continue monitoring blood glucose   Diet: Heart Healthy VTE: Eliquis Code: Full Family update: Spoke with wife, [name] Rusher 11:40 AM Dispo: Anticipated discharge to Skilled nursing facility in 1 days pending SNF bed offer; otherwise clinically stable for discharge  Romana Juniper, MD 03/09/22 [facility] Internal Medicine Program - PGY-1

## 2022-03-09 NOTE — Plan of Care (Signed)

## 2022-03-10 DIAGNOSIS — R296 Repeated falls: Secondary | ICD-10-CM | POA: Diagnosis not present

## 2022-03-10 DIAGNOSIS — R5381 Other malaise: Secondary | ICD-10-CM | POA: Diagnosis not present

## 2022-03-10 NOTE — Plan of Care (Incomplete)
Pt discharging to [facility] today,

## 2022-03-10 NOTE — Discharge Summary (Signed)
Name: Edwin Wall MRN: [id number] DOB: May 08, 1940 82 y.o. PCP: Edwin Noon, MD  Date of Admission: 03/05/2022  9:30 AM Date of Discharge: 03/10/2022 1:22 PM Attending Physician: Dr.  Cain Sieve  Discharge Diagnosis: Principal Problem:   Physical deconditioning Active Problems:   COVID-19   Recurrent falls    Discharge Medications: Allergies as of 03/10/2022   No Known Allergies      Medication List     TAKE these medications    apixaban 2.5 MG Tabs tablet Commonly known as: ELIQUIS Take 1 tablet (2.5 mg total) by mouth 2 (two) times daily.   colestipol 1 g tablet Commonly known as: COLESTID Take 1 tablet (1 g total) by mouth 2 (two) times daily. What changed: when to take this   diclofenac Sodium 1 % Gel Commonly known as: VOLTAREN Apply 4 g topically 4 (four) times daily.   ezetimibe 10 MG tablet Commonly known as: ZETIA Take 1 tablet (10 mg total) by mouth daily.   furosemide 20 MG tablet Commonly known as: LASIX Take 1 tablet (20 mg total) by mouth every other day.   levETIRAcetam 750 MG tablet Commonly known as: Keppra Take 1 tablet (750 mg total) by mouth 2 (two) times daily.   liver oil-zinc oxide 40 % ointment Commonly known as: DESITIN Apply 1 application topically as needed for irritation.   NEO-SYNEPHRINE NA Place 1 spray into the nose daily as needed (congestion).   neomycin-bacitracin-polymyxin [id number] ointment Commonly known as: NEOSPORIN Apply 1 application. topically as needed for wound care.   potassium chloride 10 MEQ tablet Commonly known as: KLOR-CON M Take 10 mEq by mouth daily.   rosuvastatin 40 MG tablet Commonly known as: CRESTOR Take 1 tablet (40 mg total) by mouth daily.        Disposition and follow-up:   [name] was discharged from [facility] in Stable condition.  At the hospital follow up visit please address:  1.  Follow-up:  *Fall and physical deconditioning 2/2  COVID Pneumonia, improved - -Ensure engagement in physical therapy -Ensure safe mobilization -Symptomatic therapy for mild cough   *HFrEF -Weigh daily -Symptom management -Continue medication   *permanent Atrial fibrillation -Ensure compliance with Eliquis -Monitor for fall risk -Monitor for signs of bleeding  *Seizures -Monitor for signs or symtoms of new episodes -Ensure medication adherence to Keppra   2.  Labs / imaging needed at time of follow-up: none  3.  Pending labs/ test needing follow-up: none  4.  Medication Changes  No medications changes   Follow-up Appointments:  Contact information for after-discharge care     Destination     [facility] Preferred SNF .   Service: Skilled Nursing Contact information: [address] [place] Hospital Course by problem list: Falls Physical deconditioning 2/2 COVID Pneumonia Patient returning to ED with weakness c/b by falls at home after being discharged from hospital on 12/30 . Inially COVID 19 positive on 12/26 after being symptomatic for 5 days. Patient was evaluated by physical therapy during that admission and  recommended to be d/c'd to SNF for rehab; family opted to take home. On presentation, patient continued to feel weak, and endorsing a mild cough. No leukocytosis on presentation. CT head without intracranial processes or fractures. CT cervical, thoracic lumbar spine without evidence of fractures, acute bleeding,  or traumatic listhesis. Cough is currently improving and vital signs are stable. Physical therapy re-evaluated patient and recommeds discharge to SNF for short term rehab.  HFrEF  TTE with EF 45-50% on 12/26. Patient is on Lasix 20 mg every other day at home. CXR on presentation without evidence of pulmonary edema. Treated with one time of IV lasix during this hospitalization for bilateral LE edema with  appropriate urine output response. His VVS are stable and no evidence of fluid overload on exam today.  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 Was continued on home Eliquis 2.5 mb BID -Continue Eliquis 2.5 mg BID   CKD3a Cr at baseline on admission 1.4 Seizures Followed by neurology outpatient.  No recent seizure activity. Continued on home Keppra 750 mg   Left MCA CVA HLD  On Crestor 20 mg and Zetia 10 mg   DM Diet controlled   Discharge Subjective: Slept well, feels well, and is hungry this AM waiting for breakfast. No chest pain, shortness of breath, with minimal cough. No nausea, vomiting, or problems with urination. Excited to leave to rehab facility today.  Discharge Exam:   Blood pressure 111/75, pulse (!) 57, temperature 98.1 F (36.7 C), temperature source Oral, resp. rate 16, height 5' 8.5" (1.74 m), weight 90.7 kg, SpO2 97 %.  Constitutional:Chronically ill-appearing man sitting in bed, in no acute distress HENT: normocephalic atraumatic, mucous membranes moist Cardiovascular: regular rate and rhythm, no m/r/g, No JVD Pulmonary/Chest: normal work of breathing on room air, lungs clear to auscultation bilaterally. No crackles  Abdominal: soft, non-tender, non-distended. Neurological: alert & oriented x 3 person, year, place and situation MSK: no gross abnormalities. Trace pitting edema Skin: warm and dry Psych: Normal mood and affect  Pertinent Labs, Studies, and Procedures:     Latest Ref Rng & Units 03/06/2022    1:28 AM 03/05/2022    3:40 PM 02/28/2022   12:59 AM  CBC  WBC 4.0 - 10.5 K/uL 6.5  7.3  3.8   Hemoglobin 13.0 - 17.0 g/dL 13.8  15.4  12.3   Hematocrit 39.0 - 52.0 % 43.6  46.7  36.7   Platelets 150 - 400 K/uL 199  214  125        Latest Ref Rng & Units 03/06/2022    1:28 AM 03/05/2022    3:40 PM 02/28/2022   12:59 AM  CMP  Glucose 70 - 99 mg/dL 119  106  99   BUN 8 - 23 mg/dL '26  21  30    '$ Creatinine 0.61 - 1.24 mg/dL 1.47  1.36  1.59   Sodium 135 - 145 mmol/L 141  144  140   Potassium 3.5 - 5.1 mmol/L 4.5  3.8  4.4   Chloride 98 - 111 mmol/L 107  106  107   CO2 22 - 32 mmol/L '27  29  23   '$ Calcium 8.9 - 10.3 mg/dL 8.7  8.9  8.3   Total Protein 6.5 - 8.1 g/dL  6.2    Total Bilirubin 0.3 - 1.2 mg/dL  1.7    Alkaline Phos 38 - 126 U/L  93    AST 15 - 41 U/L  51    ALT 0 - 44 U/L  78      CT Head Wo Contrast  Result Date: 03/05/2022 CLINICAL DATA:  Trauma EXAM: CT HEAD WITHOUT CONTRAST CT CERVICAL SPINE WITHOUT CONTRAST CT THORACIC SPINE WITHOUT CONTRAST CT LUMBAR SPINE WITHOUT CONTRAST  TECHNIQUE: Multidetector CT imaging of the head and cervical, thoracic, and lumbar spine was performed following the standard protocol without intravenous contrast. Multiplanar CT image reconstructions of the cervical spine were also generated. RADIATION DOSE REDUCTION: This exam was performed according to the departmental dose-optimization program which includes automated exposure control, adjustment of the mA and/or kV according to patient size and/or use of iterative reconstruction technique. COMPARISON:  CT Head 07/15/20, CT AP 11/14/20 FINDINGS: CT HEAD FINDINGS Brain: Redemonstrated is a large region of encephalomalacia along the left frontal lobe, frontal operculum, and portions of the left insular cortex. No CT evidence of new infarct. No hemorrhage. No extra-axial fluid collection. No hydrocephalus. Vascular: No hyperdense vessel or unexpected calcification. Skull: Normal. Negative for fracture or focal lesion. Sinuses/Orbits: Pansinus mucosal thickening with findings suggestive of chronic right sphenoid sinusitis, unchanged compared to 04/14/2021. Bilateral orbits are normal in appearance. Trace right mastoid effusion. No middle ear effusion. Other: None CT CERVICAL, THORACIC, AND LUMBAR  SPINE FINDINGS Alignment: Straightening of the normal cervical lordosis. Skull base and vertebrae: No acute  fracture. No primary bone lesion or focal pathologic process. 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 Soft tissues and spinal canal: No prevertebral fluid or swelling. No visible canal hematoma. Disc levels: Multilevel degenerative changes with at least moderate spinal canal narrowing at C6-C7 secondary to a calcified disc bulge. 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 Other: There is bibasilar dependent atelectasis and bronchial wall thickening. There is somewhat diffuse clustered nodularity in the periphery the bilateral upper and lower lobes. No mediastinal adenopathy. No axillary adenopathy. Moderate coronary artery calcifications. Central airways are patent. There are small bilateral nonobstructing renal stones. There is an ill-defined hypodense lesion along the lateral margin of the left kidney (series 6, image 18), which is incompletely assessed due to lack of IV contrast and motion artifact. This was likely present on prior CT AP dated 11/14/16. IMPRESSION: 1. No acute intracanial process. Unchanged large region of encephalomalacia along the left frontal lobe, frontal operculum, and portions of the left insular cortex. 2. No acute fracture or traumatic listhesis in the cervical, thoracic, or lumbar spine. 3. 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 4. At least moderate spinal canal stenosis at C6-C7. 5. Diffuse clustered nodularity in the periphery of the bilateral upper and lower lobes, which may be infectious or inflammatory. Electronically Signed   By: Marin Roberts M.D.   On: 03/05/2022 11:18   CT Cervical Spine Wo Contrast  Result Date: 03/05/2022 CLINICAL DATA:  Trauma EXAM: CT  HEAD WITHOUT CONTRAST CT CERVICAL SPINE WITHOUT CONTRAST CT THORACIC SPINE WITHOUT CONTRAST CT LUMBAR SPINE WITHOUT CONTRAST TECHNIQUE: Multidetector CT imaging of the head and cervical, thoracic, and lumbar spine was performed following the standard protocol without intravenous contrast. Multiplanar CT image reconstructions of the cervical spine were also generated. RADIATION DOSE REDUCTION: This exam was performed according to the departmental dose-optimization program which includes automated exposure control, adjustment of the mA and/or kV according to patient size and/or use of iterative reconstruction technique. COMPARISON:  CT Head 07/15/20, CT AP 11/14/20 FINDINGS: CT HEAD FINDINGS Brain: Redemonstrated is a large region of encephalomalacia along the left frontal lobe, frontal operculum, and portions of  the left insular cortex. No CT evidence of new infarct. No hemorrhage. No extra-axial fluid collection. No hydrocephalus. Vascular: No hyperdense vessel or unexpected calcification. Skull: Normal. Negative for fracture or focal lesion. Sinuses/Orbits: Pansinus mucosal thickening with findings suggestive of chronic right sphenoid sinusitis, unchanged compared to 04/14/2021. Bilateral orbits are normal in appearance. Trace right mastoid effusion. No middle ear effusion. Other: None CT CERVICAL, THORACIC, AND LUMBAR  SPINE FINDINGS Alignment: Straightening of the normal cervical lordosis. Skull base and vertebrae: No acute fracture. No primary bone lesion or focal pathologic process. 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 Soft tissues and spinal canal: No prevertebral fluid or swelling. No visible canal hematoma. Disc levels: Multilevel degenerative changes with at least moderate spinal canal narrowing at C6-C7 secondary to a calcified disc bulge. There are multilevel degenerative changes in the thoracic spine with severe spinal canal stenosis at L2-L3 and  L3-L4, and moderate to severe spinal canal stenosis at L4-L5 Upper chest: Negative. Other: There is bibasilar dependent atelectasis and bronchial wall thickening. There is somewhat diffuse clustered nodularity in the periphery the bilateral upper and lower lobes. No mediastinal adenopathy. No axillary adenopathy. Moderate coronary artery calcifications. Central airways are patent. There are small bilateral nonobstructing renal stones. There is an ill-defined hypodense lesion along the lateral margin of the left kidney (series 6, image 18), which is incompletely assessed due to lack of IV contrast and motion artifact. This was likely present on prior CT AP dated 11/14/16. IMPRESSION: 1. No acute intracanial process. Unchanged large region of encephalomalacia along the left frontal lobe, frontal operculum, and portions of the left insular cortex. 2. No acute fracture or traumatic listhesis in the cervical, thoracic, or lumbar spine. 3. 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 4. At least moderate spinal canal stenosis at C6-C7. 5. Diffuse clustered nodularity in the periphery of the bilateral upper and lower lobes, which may be infectious or inflammatory. Electronically Signed   By: Marin Roberts M.D.   On: 03/05/2022 11:18   CT Thoracic Spine Wo Contrast  Result Date: 03/05/2022 CLINICAL DATA:  Trauma EXAM: CT HEAD WITHOUT CONTRAST CT CERVICAL SPINE WITHOUT CONTRAST CT THORACIC SPINE WITHOUT CONTRAST CT LUMBAR SPINE WITHOUT CONTRAST TECHNIQUE: Multidetector CT imaging of the head and cervical, thoracic, and lumbar spine was performed following the standard protocol without intravenous contrast. Multiplanar CT image reconstructions of the cervical spine were also generated. RADIATION DOSE REDUCTION: This exam was performed according to the departmental dose-optimization program which includes automated  exposure control, adjustment of the mA and/or kV according to patient size and/or use of iterative reconstruction technique. COMPARISON:  CT Head 07/15/20, CT AP 11/14/20 FINDINGS: CT HEAD FINDINGS Brain: Redemonstrated is a large region of encephalomalacia along the left frontal lobe, frontal operculum, and portions of the left insular cortex. No CT evidence of new infarct. No hemorrhage. No extra-axial fluid collection. No hydrocephalus. Vascular: No hyperdense vessel or unexpected calcification. Skull: Normal. Negative for fracture or focal lesion. Sinuses/Orbits: Pansinus mucosal thickening with findings suggestive of chronic right sphenoid sinusitis, unchanged compared to 04/14/2021. Bilateral orbits are normal in appearance. Trace right mastoid effusion. No middle ear effusion. Other: None CT CERVICAL, THORACIC, AND LUMBAR  SPINE FINDINGS Alignment: Straightening of the normal cervical lordosis. Skull base and vertebrae: No acute fracture. No primary bone lesion or focal pathologic process. There are  findings suggestive of DISH, particularly in the thoracic spine is no evidence of OPLL. Soft tissues and spinal canal: No prevertebral fluid or swelling. No visible canal hematoma. Disc levels: Multilevel degenerative changes with at least moderate spinal canal narrowing at C6-C7 secondary to a calcified disc bulge. 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 Other: There is bibasilar dependent atelectasis and bronchial wall thickening. There is somewhat diffuse clustered nodularity in the periphery the bilateral upper and lower lobes. No mediastinal adenopathy. No axillary adenopathy. Moderate coronary artery calcifications. Central airways are patent. There are small bilateral nonobstructing renal stones. There is an ill-defined hypodense lesion along the lateral  margin of the left kidney (series 6, image 18), which is incompletely assessed due to lack of IV contrast and motion artifact. This was likely present on prior CT AP dated 11/14/16. IMPRESSION: 1. No acute intracanial process. Unchanged large region of encephalomalacia along the left frontal lobe, frontal operculum, and portions of the left insular cortex. 2. No acute fracture or traumatic listhesis in the cervical, thoracic, or lumbar spine. 3. 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 4. At least moderate spinal canal stenosis at C6-C7. 5. Diffuse clustered nodularity in the periphery of the bilateral upper and lower lobes, which may be infectious or inflammatory. Electronically Signed   By: Marin Roberts M.D.   On: 03/05/2022 11:18   CT Lumbar Spine Wo Contrast  Result Date: 03/05/2022 CLINICAL DATA:  Trauma EXAM: CT HEAD WITHOUT CONTRAST CT CERVICAL SPINE WITHOUT CONTRAST CT THORACIC SPINE WITHOUT CONTRAST CT LUMBAR SPINE WITHOUT CONTRAST TECHNIQUE: Multidetector CT imaging of the head and cervical, thoracic, and lumbar spine was performed following the standard protocol without intravenous contrast. Multiplanar CT image reconstructions of the cervical spine were also generated. RADIATION DOSE REDUCTION: This exam was performed according to the departmental dose-optimization program which includes automated exposure control, adjustment of the mA and/or kV according to patient size and/or use of iterative reconstruction technique. COMPARISON:  CT Head 07/15/20, CT AP 11/14/20 FINDINGS: CT HEAD FINDINGS Brain: Redemonstrated is a large region of encephalomalacia along the left frontal lobe, frontal operculum, and portions of the left insular cortex. No CT evidence of new infarct. No hemorrhage. No extra-axial fluid collection. No hydrocephalus. Vascular: No hyperdense vessel or unexpected calcification.  Skull: Normal. Negative for fracture or focal lesion. Sinuses/Orbits: Pansinus mucosal thickening with findings suggestive of chronic right sphenoid sinusitis, unchanged compared to 04/14/2021. Bilateral orbits are normal in appearance. Trace right mastoid effusion. No middle ear effusion. Other: None CT CERVICAL, THORACIC, AND LUMBAR  SPINE FINDINGS Alignment: Straightening of the normal cervical lordosis. Skull base and vertebrae: No acute fracture. No primary bone lesion or focal pathologic process. 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 Soft tissues and spinal canal: No prevertebral fluid or swelling. No visible canal hematoma. Disc levels: Multilevel degenerative changes with at least moderate spinal canal narrowing at C6-C7 secondary to a calcified disc bulge. 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 Other: There is bibasilar dependent atelectasis and bronchial wall thickening. There is somewhat diffuse clustered nodularity in the periphery  the bilateral upper and lower lobes. No mediastinal adenopathy. No axillary adenopathy. Moderate coronary artery calcifications. Central airways are patent. There are small bilateral nonobstructing renal stones. There is an ill-defined hypodense lesion along the lateral margin of the left kidney (series 6, image 18), which is incompletely assessed due to lack of IV contrast and motion artifact. This was likely present on prior CT AP dated 11/14/16. IMPRESSION: 1. No acute intracanial process. Unchanged large region of encephalomalacia along the left frontal lobe, frontal operculum, and portions of the left insular cortex. 2. No acute fracture or traumatic listhesis in the cervical, thoracic, or lumbar spine. 3. 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 4. At least moderate spinal canal stenosis at C6-C7. 5. Diffuse clustered nodularity in the periphery of the bilateral upper and lower lobes, which may be infectious or inflammatory. Electronically Signed   By: Marin Roberts M.D.   On: 03/05/2022 11:18   DG Chest Port 1 View  Result Date: 03/05/2022 CLINICAL DATA:  Fall on blood thinners with back EXAM: PORTABLE CHEST 1 VIEW COMPARISON:  Chest radiograph dated 02/25/2022 FINDINGS: Normal lung volumes. No focal consolidations. No pleural effusion or pneumothorax. The heart size and mediastinal contours are within normal limits. The visualized skeletal structures are unremarkable. IMPRESSION: 1. No active disease. 2.  No radiographic finding of acute displaced fracture. Electronically Signed   By: Darrin Nipper M.D.   On: 03/05/2022 10:11     Discharge Instructions: Discharge Instructions     (HEART FAILURE PATIENTS) Call MD:  Anytime you have any of the following symptoms: 1) 3 pound weight gain in 24 hours or 5 pounds in 1 week 2) shortness of breath, with or without a dry hacking cough 3) swelling in the hands, feet or stomach 4) if you have to sleep on extra pillows at night in order to breathe.   Complete by: As directed    Call MD for:  difficulty breathing, headache or visual disturbances   Complete by: As directed    Call MD for:  persistant dizziness or light-headedness   Complete by: As directed    Call MD for:  persistant nausea and vomiting   Complete by: As directed    Call MD for:  severe uncontrolled pain   Complete by: As directed    Call MD for:  temperature >100.4   Complete by: As directed    Diet - low sodium heart healthy   Complete by: As directed    Discharge instructions   Complete by: As directed    Mr. Vangieson,  You were brought to the hospital because you fell as you were still feeling weak after your recent COVID  infection. While you were here, you were re-evaluated by physical therapy and they recommended you were discharge to an acute rehabilitation facility for a short period of time.   While you were here, you were also given all of your medications for chronic conditions. You are stable for discharge today.  There have been no changes to your medication list. Please continue taking all your medications as you were before.   It was a pleasure caring for you, Romana Juniper, MD   Increase activity slowly   Complete by: As directed        Signed: Romana Juniper, MD [facility] - PGY1 Pager: [phone] 03/10/2022, 1:22 PM

## 2022-03-10 NOTE — Discharge Instructions (Signed)
You were hospitalized for  Hospital Course:   Medications:  Please start taking: -  Please stop taking: -  Please continue taking: -  Follow-up: - Please follow up with your primary care provider Dr. Melford Aase, Rebeca Alert, MD

## 2022-03-10 NOTE — TOC Progression Note (Addendum)
Transition of Care [facility]) - Progression Note    Patient Details  Name: Edwin Wall MRN: [id number] Date of Birth: [birth year]  Transition of Care [facility]) CM/SW [place], RN Phone Number: 03/10/2022, 9:55 AM  Clinical Narrative:    CM called and left a message with Perrin Smack, CM at [facility] to check on availability for admission to the facility today.  CM will follow up.  03/10/21 1012 - CM called and spoke with Kitty, CM at the facility and they have an available bed to admit the patient today.  I sent a message to attending physician and once discharge orders and summary are completed, I'll arrange for PTAR transportation.  The patient was updated and aware.  Bedside nursing - please call [facility] to give nursing report at [phone].  CM will continue to follow the patient for SNF admission to [facility] today.  03/10/21 - Discharge summary was completed and uploaded in the hub for the facility.  PTAR was arranged and the patient was set up for transportation to the facility.        Expected Discharge Plan and Services                                               Social Determinants of Health (SDOH) Interventions SDOH Screenings   Food Insecurity: No Food Insecurity (03/06/2022)  Housing: Low Risk  (03/06/2022)  Transportation Needs: No Transportation Needs (03/06/2022)  Utilities: Not At Risk (03/06/2022)  Depression (PHQ2-9): Low Risk  (09/10/2020)  Tobacco Use: Low Risk  (03/09/2022)    Readmission Risk Interventions     No data to display

## 2022-03-27 ENCOUNTER — Ambulatory Visit: Payer: Medicare Other | Admitting: Cardiology

## 2022-03-28 ENCOUNTER — Ambulatory Visit: Payer: Medicare Other | Admitting: Cardiology

## 2022-04-04 ENCOUNTER — Ambulatory Visit: Payer: Medicare Other | Attending: Internal Medicine | Admitting: Physical Therapy

## 2022-04-04 DIAGNOSIS — M6281 Muscle weakness (generalized): Secondary | ICD-10-CM | POA: Insufficient documentation

## 2022-04-04 DIAGNOSIS — R2681 Unsteadiness on feet: Secondary | ICD-10-CM | POA: Diagnosis present

## 2022-04-04 DIAGNOSIS — R2689 Other abnormalities of gait and mobility: Secondary | ICD-10-CM | POA: Diagnosis present

## 2022-04-04 DIAGNOSIS — R278 Other lack of coordination: Secondary | ICD-10-CM | POA: Insufficient documentation

## 2022-04-04 NOTE — Therapy (Signed)
OUTPATIENT PHYSICAL THERAPY NEURO EVALUATION   Patient Name: Edwin Wall MRN: [id number] DOB:January 20, 1941, 82 y.o., male Today's Date: 04/04/2022   PCP: [name], MD REFERRING PROVIDER: [name], MD  END OF SESSION:  PT End of Session - 04/04/22 1235     Visit Number 1    Number of Visits 9   Plus eval   Date for PT Re-Evaluation 06/06/22    Authorization Type Medicare A & B    PT Start Time 8099    PT Stop Time 1310    PT Time Calculation (min) 39 min    Activity Tolerance Patient tolerated treatment well    Behavior During Therapy [facility] for tasks assessed/performed             Past Medical History:  Diagnosis Date   Anxiety    Arthritis    Bilateral leg edema    Cataract    Colon cancer ([name]) dx'd 11/2016   "stage 3"   Gout    Gout    Hemorrhoids    History of kidney stones    "passed it"   Hypertension    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[name])    EF 45-50% by echo 10/22   PAF (paroxysmal atrial fibrillation) ([name])    Pre-diabetes    Past Surgical History:  Procedure Laterality Date   CARDIOVERSION [name] 04/23/2021   Procedure: CARDIOVERSION;  Surgeon: [name], MD;  Location: [place];  Service: Cardiovascular;  Laterality: [name];   COLECTOMY  12/17/2016   lap; partial sigmoid colectomy/notes 12/17/2016   COLONOSCOPY W/ BIOPSIES AND POLYPECTOMY  11/11/2016   LAPAROSCOPIC SIGMOID COLECTOMY [name] 12/17/2016   Procedure: LAPAROSCOPIC SIGMOID COLECTOMY;  Surgeon: [name], MD;  Location: [name];  Service: General;  Laterality: [name];   LEFT HEART CATH AND CORONARY ANGIOGRAPHY [name] 03/11/2021   Procedure: LEFT HEART CATH AND CORONARY ANGIOGRAPHY;  Surgeon: [name], MD;  Location: [name];  Service: Cardiovascular;  Laterality: [name];   NASAL SEPTUM SURGERY     TONSILLECTOMY     Patient Active Problem List   Diagnosis Date Noted   Recurrent falls 03/06/2022   COVID-19 03/05/2022   Physical deconditioning  03/05/2022   Non-traumatic rhabdomyolysis 02/28/2022   Acute on chronic heart failure ([name]) 02/25/2022   Pneumonia due to COVID-19 virus 02/25/2022   Bilateral leg edema 04/18/2021   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[name]) 04/18/2021   AMS (altered mental status) 04/14/2021   Syncope and collapse 03/11/2021   Abnormal nuclear stress test 03/11/2021   Protein-calorie malnutrition, severe 01/18/2021   Orthostatic hypotension    Fall    Weight loss    Secondary hypercoagulable state ([name]) 01/02/2021   LOC (loss of consciousness) ([name]) 12/23/2020   Abnormality of gait 09/10/2020   Leukocytosis    Somnolence    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/2020   Acute right hemiparesis ([name]) 07/19/2020   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   AKI (acute kidney injury) ([name])    Prediabetes    PAF (paroxysmal atrial fibrillation) ([name])    Dysphagia    Hyperlipidemia 07/16/2020   Type II diabetes mellitus ([name]) 07/16/2020   Atrial fibrillation ([name]) 07/16/2020   Acute CVA (cerebrovascular accident) ([name]) 07/15/2020   Cancer of sigmoid colon ([name]) 12/17/2016   Obesity (BMI 30-39.9) 01/15/2011   Hypertension 01/15/2011   History of gout 01/15/2011   ED (erectile dysfunction) 01/15/2011    ONSET DATE: 02/28/2022  REFERRING DIAG: Unsteadiness on feet (R26.81);Muscle weakness (generalized) (M62.81);Difficulty in walking,  not elsewhere classified (R26.2)  THERAPY DIAG:  Unsteadiness on feet  Other lack of coordination  Other abnormalities of gait and mobility  Rationale for Evaluation and Treatment: Habilitation  SUBJECTIVE:                                                                                                                                                                                             SUBJECTIVE STATEMENT: Pt ambulated into clinic using SBQC. Pt reports he has been home for about one week following stay at [name] rehab. No falls since being home. "I don't remember  rehab being helpful but it was nice to be away from home I will tell you that much". Pt unable to recall what happened when he fell prior to going to hospital but does know he hit his head and was unable to get up. Pt did not think he needed to go to SNF following first fall and went home. [place] again shortly after and then went to rehab. Had a stroke about two years ago and received therapy here for ~3 months.   Pt accompanied by:  Wife, Edwin Wall   PERTINENT HISTORY: CVA, HFrEF, Afib, HLD, seizures, CKD  PAIN:  Are you having pain? No Pt denied pain but then reported having "pain" in BLEs distal to knees   PRECAUTIONS: Fall  WEIGHT BEARING RESTRICTIONS: No  FALLS: Has patient fallen in last 6 months? Yes. Number of falls at least 2  LIVING ENVIRONMENT: Lives with: lives with their family and lives with their spouse Lives in: House/apartment Stairs: Yes: Internal: full flight steps; can reach both and External: 4 in front w/rails and 2 in back without rails steps; none Has following equipment at home: Quad cane small base, Walker - 2 wheeled, Walker - 4 wheeled, shower chair, bed side commode, Grab bars, and is obtaining a WC  PLOF: Requires assistive device for independence, Needs assistance with homemaking, and Needs assistance with gait  PATIENT GOALS: Pt pointed to feet when asked question   OBJECTIVE:   DIAGNOSTIC FINDINGS: CT of head on 03/05/22  IMPRESSION: 1. No acute intracanial process. Unchanged large region of encephalomalacia along the left frontal lobe, frontal operculum, and portions of the left insular cortex. 2. No acute fracture or traumatic listhesis in the cervical, thoracic, or lumbar spine. 3. 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 4. At least moderate spinal canal stenosis at C6-C7. 5. Diffuse clustered nodularity in the periphery of the  bilateral upper and lower lobes, which may be infectious  or inflammatory.  COGNITION: Overall cognitive status: Impaired   SENSATION: Pt unable to accurately respond to sensation testing or report if he has numbness/tingling    POSTURE: rounded shoulders, forward head, and increased thoracic kyphosis  LOWER EXTREMITY MMT:  Tested in seated position   MMT Right Eval Left Eval  Hip flexion 3+ 4  Hip extension    Hip abduction 4- 4  Hip adduction 4-  4  Hip internal rotation    Hip external rotation    Knee flexion 3+ 4  Knee extension 4- 4  Ankle dorsiflexion 4 4  Ankle plantarflexion    Ankle inversion    Ankle eversion    (Blank rows = not tested)  BED MOBILITY:  Independent per pt  TRANSFERS: Assistive device utilized: Lobbyist  Sit to stand: SBA heavy reliance on BUEs  Stand to sit: SBA  GAIT: Gait pattern: step through pattern, decreased arm swing- Right, decreased arm swing- Left, decreased step length- Left, decreased stance time- Right, decreased stride length, decreased hip/knee flexion- Right, decreased ankle dorsiflexion- Right, decreased trunk rotation, and poor foot clearance- Right Distance walked: Various clinic distances  Assistive device utilized: Quad cane small base Level of assistance: SBA and CGA Comments: Pt frequently switching cane from LUE to RUE and carrying cane rather than placing it on ground. Pt had a few instances of hitting obstacles in clinic w/cane or holding cane on R side and tripping over legs, requiring CGA for safety   FUNCTIONAL TESTS:   [facility] PT Assessment - 04/04/22 1258       Transfers   Five time sit to stand comments  20.9s w/BUE support      Ambulation/Gait   Gait velocity 32.8' over 12.13s w/SBQC = 2.7 ft/s      Balance   Balance Assessed Yes      Standardized Balance Assessment   Standardized Balance Assessment Timed Up and Go Test      Timed Up and Go Test   Normal TUG (seconds) 14.6   w/SBQC              TODAY'S TREATMENT:    Next Session                                                                                                                              PATIENT EDUCATION: Education details: Eval findings, POC, safety w/use of SBQC  Person educated: Patient and Spouse Education method: Explanation, Demonstration, and Verbal cues Education comprehension: needs further education  HOME EXERCISE PROGRAM: To be established   GOALS: Goals reviewed with patient? Yes  SHORT TERM GOALS: Target date: 05/02/2022   Pt will perform initial HEP w/S* from wife for improved strength, balance, transfers and gait.  Baseline: not established on eval  Goal status: INITIAL  2.  6MWT to be assessed and STG/LTG written   Baseline:  Goal status: INITIAL  3.  Berg to be assessed and  STG/LTG written Baseline:  Goal status: INITIAL   LONG TERM GOALS: Target date: 05/30/2022   Pt will be independent with final HEP for improved strength, balance, transfers and gait.  Baseline:  Goal status: INITIAL  2.  Pt will improve gait velocity to at least 3.0 ft/s w/LRAD at S* level for improved gait efficiency  Baseline: 2.7 ft/s w/SBQC  Goal status: INITIAL  3.  Berg goal  Baseline:  Goal status: INITIAL  4.  6MWT goal Baseline:  Goal status: INITIAL  5.  Pt will improve normal TUG to less than or equal to 12 seconds w/LRAD for improved functional mobility and decreased fall risk.  Baseline: 14.6s w/SBQC Goal status: INITIAL   ASSESSMENT:  CLINICAL IMPRESSION: Patient is a 82 year old male referred to Neuro OPPT for generalized muscle weakness. Pt's PMH is significant for: CVA, HFrEF, Afib, HLD, seizures, CKD The following deficits were present during the exam: decreased safety awareness, impaired balance, decreased strength and decreased activity tolerance. Based on TUG and fall history, pt is an incr risk for falls. Pt would benefit from skilled PT to address these  impairments and functional limitations to maximize functional mobility independence.   OBJECTIVE IMPAIRMENTS: Abnormal gait, decreased activity tolerance, decreased balance, decreased cognition, decreased coordination, decreased knowledge of condition, decreased knowledge of use of DME, difficulty walking, decreased safety awareness, and improper body mechanics  ACTIVITY LIMITATIONS: carrying, lifting, squatting, stairs, transfers, locomotion level, and caring for others  PARTICIPATION LIMITATIONS: meal prep, cleaning, laundry, interpersonal relationship, driving, shopping, community activity, and yard work  PERSONAL FACTORS: Age, Behavior pattern, Fitness, Past/current experiences, and 1 comorbidity: Hx of CVA  are also affecting patient's functional outcome.   REHAB POTENTIAL: Good  CLINICAL DECISION MAKING: Stable/uncomplicated  EVALUATION COMPLEXITY: Low  PLAN:  PT FREQUENCY: 1x/week  PT DURATION: 8 weeks  PLANNED INTERVENTIONS: Therapeutic exercises, Therapeutic activity, Neuromuscular re-education, Balance training, Gait training, Patient/Family education, Self Care, Joint mobilization, Stair training, DME instructions, Manual therapy, and Re-evaluation  PLAN FOR NEXT SESSION: Berg, 6MWT, establish initial HEP and walking program. Trial gait without AD   Cruzita Lederer Sueellen Kayes, PT, DPT 04/04/2022, 2:08 PM

## 2022-04-11 ENCOUNTER — Ambulatory Visit: Payer: Medicare Other | Admitting: Physical Therapy

## 2022-04-11 ENCOUNTER — Encounter: Payer: Self-pay | Admitting: Physical Therapy

## 2022-04-11 VITALS — BP 131/73 | HR 58

## 2022-04-11 DIAGNOSIS — R2681 Unsteadiness on feet: Secondary | ICD-10-CM | POA: Diagnosis not present

## 2022-04-11 DIAGNOSIS — M6281 Muscle weakness (generalized): Secondary | ICD-10-CM

## 2022-04-11 DIAGNOSIS — R2689 Other abnormalities of gait and mobility: Secondary | ICD-10-CM

## 2022-04-11 DIAGNOSIS — R278 Other lack of coordination: Secondary | ICD-10-CM

## 2022-04-11 NOTE — Therapy (Signed)
OUTPATIENT PHYSICAL THERAPY NEURO EVALUATION   Patient Name: Edwin Wall MRN: [id number] DOB:[birth year], 82 y.o., male Today's Date: 04/11/2022   PCP: [name], MD REFERRING PROVIDER: [name], MD  END OF SESSION:  PT End of Session - 04/11/22 1404     Visit Number 2    Number of Visits 9    Date for PT Re-Evaluation 06/06/22    Authorization Type Medicare A & B    PT Start Time 1402    PT Stop Time [id number]    PT Time Calculation (min) 43 min    Equipment Utilized During Treatment Gait belt    Activity Tolerance Patient tolerated treatment well    Behavior During Therapy WFL for tasks assessed/performed             Past Medical History:  Diagnosis Date   Anxiety    Arthritis    Bilateral leg edema    Cataract    Colon cancer ([place]) dx'd 11/2016   "stage 3"   Gout    Gout    Hemorrhoids    History of kidney stones    "passed it"   Hypertension    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[place])    EF 45-50% by echo 10/22   PAF (paroxysmal atrial fibrillation) ([place])    Pre-diabetes    Past Surgical History:  Procedure Laterality Date   CARDIOVERSION [name] 04/23/2021   Procedure: CARDIOVERSION;  Surgeon: [name], MD;  Location: [place];  Service: Cardiovascular;  Laterality: [name];   COLECTOMY  12/17/2016   lap; partial sigmoid colectomy/notes 12/17/2016   COLONOSCOPY W/ BIOPSIES AND POLYPECTOMY  11/11/2016   LAPAROSCOPIC SIGMOID COLECTOMY [name] 12/17/2016   Procedure: LAPAROSCOPIC SIGMOID COLECTOMY;  Surgeon: [name], MD;  Location: [place];  Service: General;  Laterality: [name];   LEFT HEART CATH AND CORONARY ANGIOGRAPHY [name] 03/11/2021   Procedure: LEFT HEART CATH AND CORONARY ANGIOGRAPHY;  Surgeon: [name], MD;  Location: [place] CV LAB;  Service: Cardiovascular;  Laterality: [name];   NASAL SEPTUM SURGERY     TONSILLECTOMY     Patient Active Problem List   Diagnosis Date Noted   Recurrent falls 03/06/2022   COVID-19  03/05/2022   Physical deconditioning 03/05/2022   Non-traumatic rhabdomyolysis 02/28/2022   Acute on chronic heart failure ([place]) 02/25/2022   Pneumonia due to COVID-19 virus 02/25/2022   Bilateral leg edema 04/18/2021   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[place]) 04/18/2021   AMS (altered mental status) 04/14/2021   Syncope and collapse 03/11/2021   Abnormal nuclear stress test 03/11/2021   Protein-calorie malnutrition, severe 01/18/2021   Orthostatic hypotension    Fall    Weight loss    Secondary hypercoagulable state ([place]) 01/02/2021   LOC (loss of consciousness) ([place]) 12/23/2020   Abnormality of gait 09/10/2020   Leukocytosis    Somnolence    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/2020   Acute right hemiparesis ([place]) 07/19/2020   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   AKI (acute kidney injury) ([place])    Prediabetes    PAF (paroxysmal atrial fibrillation) ([place])    Dysphagia    Hyperlipidemia 07/16/2020   Type II diabetes mellitus ([place]) 07/16/2020   Atrial fibrillation ([place]) 07/16/2020   Acute CVA (cerebrovascular accident) ([place]) 07/15/2020   Cancer of sigmoid colon ([place]) 12/17/2016   Obesity (BMI 30-39.9) 01/15/2011   Hypertension 01/15/2011   History of gout 01/15/2011   ED (erectile dysfunction) 01/15/2011    ONSET DATE: 02/28/2022  REFERRING DIAG: Unsteadiness on feet (  R26.81);Muscle weakness (generalized) (M62.81);Difficulty in walking, not elsewhere classified (R26.2)  THERAPY DIAG:  Unsteadiness on feet  Other lack of coordination  Other abnormalities of gait and mobility  Muscle weakness (generalized)  Rationale for Evaluation and Treatment: Habilitation  SUBJECTIVE:                                                                                                                                                                                             SUBJECTIVE STATEMENT: Pt ambulated into clinic without AD. Patient wife reports that patient has had multiples canes,  but one cane broke and they left another one somewhere and couldn't find it, so they were using the [facility] in the meantime. Patient has not been using the [facility] since last therapy visit. Denies falls/near falls.   Pt accompanied by:  Wife, [name]   PERTINENT HISTORY: CVA, HFrEF, Afib, HLD, seizures, CKD  PAIN:  Are you having pain? No  PRECAUTIONS: Fall  WEIGHT BEARING RESTRICTIONS: No  FALLS: Has patient fallen in last 6 months? Yes. Number of falls at least 2  LIVING ENVIRONMENT: Lives with: lives with their family and lives with their spouse Lives in: House/apartment Stairs: Yes: Internal: full flight steps; can reach both and External: 4 in front w/rails and 2 in back without rails steps; none Has following equipment at home: Quad cane small base, Walker - 2 wheeled, Walker - 4 wheeled, shower chair, bed side commode, Grab bars, and is obtaining a WC  PLOF: Requires assistive device for independence, Needs assistance with homemaking, and Needs assistance with gait  PATIENT GOALS: Pt pointed to feet when asked question   OBJECTIVE:   DIAGNOSTIC FINDINGS: CT of head on 03/05/22  IMPRESSION: 1. No acute intracanial process. Unchanged large region of encephalomalacia along the left frontal lobe, frontal operculum, and portions of the left insular cortex. 2. No acute fracture or traumatic listhesis in the cervical, thoracic, or lumbar spine. 3. 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 4. At least moderate spinal canal stenosis at C6-C7. 5. Diffuse clustered nodularity in the periphery of the bilateral upper and lower lobes, which may be infectious or inflammatory.  COGNITION: Overall cognitive status: Impaired  FUNCTIONAL TESTS:    [facility] PT Assessment - 04/11/22 0001       6 minute walk test results    Aerobic Endurance Distance Walked 1028   feet without AD  (SBA - CGA)     Standardized Balance Assessment   Standardized Balance Assessment Berg Balance Test  Berg Balance Test   Sit to Stand Able to stand  independently using hands    Standing Unsupported Able to stand safely 2 minutes    Sitting with Back Unsupported but Feet Supported on Floor or Stool Able to sit safely and securely 2 minutes    Stand to Sit Controls descent by using hands    Transfers Able to transfer safely, definite need of hands    Standing Unsupported with Eyes Closed Able to stand 10 seconds with supervision    Standing Unsupported with Feet Together Able to place feet together independently and stand 1 minute safely    From Standing, Reach Forward with Outstretched Arm Can reach forward >12 cm safely (5")    From Standing Position, Pick up Object from Floor Able to pick up shoe, needs supervision    From Standing Position, Turn to Look Behind Over each Shoulder Looks behind one side only/other side shows less weight shift    Turn 360 Degrees Able to turn 360 degrees safely one side only in 4 seconds or less    Standing Unsupported, Alternately Place Feet on Step/Stool Able to complete >2 steps/needs minimal assist    Standing Unsupported, One Foot in [employer] balance while stepping or standing    Standing on One Leg Tries to lift leg/unable to hold 3 seconds but remains standing independently    Total Score 38              TODAY'S TREATMENT:     NMR:  Assessed outcomes as noted above.   Created Initial HEP (Recommend Caregiver Presence when completing): Semitandem hovering hands over counter 3 x 30" Hip swings forward/back with single UE support for modified SLS stability progression x 10                                                                                                                              PATIENT EDUCATION: Education details: Eval findings, POC, safety w/use of SBQC  Person educated: Patient and Spouse Education method:  Explanation, Demonstration, Verbal cues, and Handouts Education comprehension: needs further education  HOME EXERCISE PROGRAM: Access Code: [email or web address] URL: [email or web address] Date: 04/11/2022 Prepared by: Malachi Carl  Exercises - Hip Swing  - 1 x daily - 7 x weekly - 3 sets - 10 reps - Semi-Tandem Balance at [employer] Eyes Open  - 1 x daily - 7 x weekly - 3 sets - 30 hold  GOALS: Goals reviewed with patient? Yes  SHORT TERM GOALS: Target date: 05/02/2022   Pt will perform initial HEP w/S* from wife for improved strength, balance, transfers and gait.  Baseline: not established on eval  Goal status: INITIAL  2.  Patient will improve 6MWT score by 112 feet to demonstrate a clinically meaningful improvement in aerobic capacity and endurance needed for ambulating in the community.   Baseline: 1028 feet with SBA-CGA Goal status: INITIAL  3.  Patient will improve Berg Balance score  by 5 points to indicate clinically significant progress towards a decreased risk of falls and improved static stability.   Baseline: 38/56 Goal status: INITIAL   LONG TERM GOALS: Target date: 05/30/2022   Pt will be independent with final HEP for improved strength, balance, transfers and gait.  Baseline:  Goal status: INITIAL  2.  Pt will improve gait velocity to at least 3.0 ft/s w/LRAD at S* level for improved gait efficiency  Baseline: 2.7 ft/s w/SBQC  Goal status: INITIAL  3.  Patient will improve Berg Balance score to 45/56 or greater to indicate a decreased risk of falls and improved static stability.   Baseline: 38/56 Goal status: INITIAL  4.  Patient will improve 6MWT score by 160 feet to demonstrate a clinically meaningful improvement in aerobic capacity and endurance needed for ambulating in the community and progress towards age matched norms.   Baseline:1028 feet with SBA-CGA  Goal status: INITIAL  5.  Pt will improve normal TUG to less than or equal to 12 seconds  w/LRAD for improved functional mobility and decreased fall risk.  Baseline: 14.6s w/SBQC Goal status: INITIAL   ASSESSMENT:  CLINICAL IMPRESSION:  Session emphasized assessment of functional outcomes and establishment of initial HEP. Patient demonstrates decreased cardiovascular endurance and ambulating below age matched norms as indicated by 6MWT. [place] also at an increased risk for falls as indicated by Berg score < 45. Provided education to both caregiver/patient on initial HEP. Pt would benefit from skilled PT to address these impairments and functional limitations to maximize functional mobility independence.   OBJECTIVE IMPAIRMENTS: Abnormal gait, decreased activity tolerance, decreased balance, decreased cognition, decreased coordination, decreased knowledge of condition, decreased knowledge of use of DME, difficulty walking, decreased safety awareness, and improper body mechanics  ACTIVITY LIMITATIONS: carrying, lifting, squatting, stairs, transfers, locomotion level, and caring for others  PARTICIPATION LIMITATIONS: meal prep, cleaning, laundry, interpersonal relationship, driving, shopping, community activity, and yard work  PERSONAL FACTORS: Age, Behavior pattern, Fitness, Past/current experiences, and 1 comorbidity: Hx of CVA  are also affecting patient's functional outcome.   REHAB POTENTIAL: Good  CLINICAL DECISION MAKING: Stable/uncomplicated  EVALUATION COMPLEXITY: Low  PLAN:  PT FREQUENCY: 1x/week  PT DURATION: 8 weeks  PLANNED INTERVENTIONS: Therapeutic exercises, Therapeutic activity, Neuromuscular re-education, Balance training, Gait training, Patient/Family education, Self Care, Joint mobilization, Stair training, DME instructions, Manual therapy, and Re-evaluation  PLAN FOR NEXT SESSION: progress initial HEP, tandem balance, dynamic balance tasks with stepping strategy, walking for endurance/sciFit, LE strengthening   Esperanza Heir, PT, DPT 04/11/2022,  4:04 PM

## 2022-04-18 ENCOUNTER — Ambulatory Visit: Payer: Medicare Other | Admitting: Physical Therapy

## 2022-04-18 DIAGNOSIS — R2689 Other abnormalities of gait and mobility: Secondary | ICD-10-CM

## 2022-04-18 DIAGNOSIS — R2681 Unsteadiness on feet: Secondary | ICD-10-CM

## 2022-04-18 DIAGNOSIS — M6281 Muscle weakness (generalized): Secondary | ICD-10-CM

## 2022-04-18 NOTE — Therapy (Signed)
OUTPATIENT PHYSICAL THERAPY NEURO TREATMENT   Patient Name: Edwin Wall MRN: [id number] DOB:March 17, 1940, 82 y.o., male Today's Date: 04/18/2022   PCP: [name], MD REFERRING PROVIDER: [name], MD  END OF SESSION:  PT End of Session - 04/18/22 1234     Visit Number 3    Number of Visits 9    Date for PT Re-Evaluation 06/06/22    Authorization Type Medicare A & B    PT Start Time 1232    PT Stop Time 1315    PT Time Calculation (min) 43 min    Equipment Utilized During Treatment --    Activity Tolerance Patient tolerated treatment well    Behavior During Therapy [facility] for tasks assessed/performed              Past Medical History:  Diagnosis Date   Anxiety    Arthritis    Bilateral leg edema    Cataract    Colon cancer ([place]) dx'd 11/2016   "stage 3"   Gout    Gout    Hemorrhoids    History of kidney stones    "passed it"   Hypertension    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[place])    EF 45-50% by echo 10/22   PAF (paroxysmal atrial fibrillation) ([place])    Pre-diabetes    Past Surgical History:  Procedure Laterality Date   CARDIOVERSION [name] 04/23/2021   Procedure: CARDIOVERSION;  Surgeon: [name], MD;  Location: [facility] ENDOSCOPY;  Service: Cardiovascular;  Laterality: [name];   COLECTOMY  12/17/2016   lap; partial sigmoid colectomy/notes 12/17/2016   COLONOSCOPY W/ BIOPSIES AND POLYPECTOMY  11/11/2016   LAPAROSCOPIC SIGMOID COLECTOMY [name] 12/17/2016   Procedure: LAPAROSCOPIC SIGMOID COLECTOMY;  Surgeon: [name], MD;  Location: [place];  Service: General;  Laterality: [name];   LEFT HEART CATH AND CORONARY ANGIOGRAPHY [name] 03/11/2021   Procedure: LEFT HEART CATH AND CORONARY ANGIOGRAPHY;  Surgeon: [name], MD;  Location: [place] CV LAB;  Service: Cardiovascular;  Laterality: [name];   NASAL SEPTUM SURGERY     TONSILLECTOMY     Patient Active Problem List   Diagnosis Date Noted   Recurrent falls 03/06/2022   COVID-19  03/05/2022   Physical deconditioning 03/05/2022   Non-traumatic rhabdomyolysis 02/28/2022   Acute on chronic heart failure ([place]) 02/25/2022   Pneumonia due to COVID-19 virus 02/25/2022   Bilateral leg edema 04/18/2021   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[place]) 04/18/2021   AMS (altered mental status) 04/14/2021   Syncope and collapse 03/11/2021   Abnormal nuclear stress test 03/11/2021   Protein-calorie malnutrition, severe 01/18/2021   Orthostatic hypotension    Fall    Weight loss    Secondary hypercoagulable state ([place]) 01/02/2021   LOC (loss of consciousness) ([place]) 12/23/2020   Abnormality of gait 09/10/2020   Leukocytosis    Somnolence    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/2020   Acute right hemiparesis ([place]) 07/19/2020   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   AKI (acute kidney injury) ([place])    Prediabetes    PAF (paroxysmal atrial fibrillation) ([place])    Dysphagia    Hyperlipidemia 07/16/2020   Type II diabetes mellitus ([place]) 07/16/2020   Atrial fibrillation ([place]) 07/16/2020   Acute CVA (cerebrovascular accident) ([place]) 07/15/2020   Cancer of sigmoid colon ([place]) 12/17/2016   Obesity (BMI 30-39.9) 01/15/2011   Hypertension 01/15/2011   History of gout 01/15/2011   ED (erectile dysfunction) 01/15/2011    ONSET DATE: 02/28/2022  REFERRING DIAG: Unsteadiness on feet (  R26.81);Muscle weakness (generalized) (M62.81);Difficulty in walking, not elsewhere classified (R26.2)  THERAPY DIAG:  Unsteadiness on feet  Other abnormalities of gait and mobility  Muscle weakness (generalized)  Rationale for Evaluation and Treatment: Habilitation  SUBJECTIVE:                                                                                                                                                                                             SUBJECTIVE STATEMENT: Pt ambulated into clinic without AD. "You told me I don't need it so I do not have it". Pt denies any falls since last  session.   Pt accompanied by:  Wife, [name]   PERTINENT HISTORY: CVA, HFrEF, Afib, HLD, seizures, CKD  PAIN:  Are you having pain? No  PRECAUTIONS: Fall  WEIGHT BEARING RESTRICTIONS: No  FALLS: Has patient fallen in last 6 months? Yes. Number of falls at least 2  LIVING ENVIRONMENT: Lives with: lives with their family and lives with their spouse Lives in: House/apartment Stairs: Yes: Internal: full flight steps; can reach both and External: 4 in front w/rails and 2 in back without rails steps; none Has following equipment at home: Quad cane small base, Walker - 2 wheeled, Walker - 4 wheeled, shower chair, bed side commode, Grab bars, and is obtaining a WC  PLOF: Requires assistive device for independence, Needs assistance with homemaking, and Needs assistance with gait  PATIENT GOALS: Pt pointed to feet when asked question   OBJECTIVE:   DIAGNOSTIC FINDINGS: CT of head on 03/05/22  IMPRESSION: 1. No acute intracanial process. Unchanged large region of encephalomalacia along the left frontal lobe, frontal operculum, and portions of the left insular cortex. 2. No acute fracture or traumatic listhesis in the cervical, thoracic, or lumbar spine. 3. 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 4. At least moderate spinal canal stenosis at C6-C7. 5. Diffuse clustered nodularity in the periphery of the bilateral upper and lower lobes, which may be infectious or inflammatory.  COGNITION: Overall cognitive status: Impaired   TODAY'S TREATMENT:    Ther Act  Inquired about why pt not using cane and pt argumentative about therapist telling him not to use cane. Reminded pt that a therapist did not tell him to stop using cane, but rather informed him that he needed more practice to use it safely. Pt's wife reports they need to purchase more canes as one broke and she needs to  use one as well. Pt stating he does not and will not use a cane at home, so  therapist did not inquire further   Lengthy discussion encouraging pt to participate in SLP and OT, as he demonstrates significant memory loss and word-finding difficulty, as well as impaired safety awareness, contributing to his high fall risk. Pt and wife seemingly unaware of the severity of pt's deficits and report they would prefer to "walk some" and assess if this helps pt.  Wife frequently asking therapist questions about COVID or her knees, all appropriate questions addressed.  Encouraged pt and wife start a walking program, starting at 5-10 minutes per day, for improved global strength and endurance. Provided information on joining the [facility] as well, but pt declined.                                                        PATIENT EDUCATION: Education details: Continue HEP, strong encouragement to participate in SLP  Person educated: Patient and Spouse Education method: Explanation, Media planner, Verbal cues, and Handouts Education comprehension: needs further education  HOME EXERCISE PROGRAM: Access Code: [email or web address] URL: [email or web address] Date: 04/11/2022 Prepared by: Malachi Carl  Exercises - Hip Swing  - 1 x daily - 7 x weekly - 3 sets - 10 reps - Semi-Tandem Balance at [employer] Eyes Open  - 1 x daily - 7 x weekly - 3 sets - 30 hold  GOALS: Goals reviewed with patient? Yes  SHORT TERM GOALS: Target date: 05/02/2022   Pt will perform initial HEP w/S* from wife for improved strength, balance, transfers and gait.  Baseline: not established on eval  Goal status: INITIAL  2.  Patient will improve 6MWT score by 112 feet to demonstrate a clinically meaningful improvement in aerobic capacity and endurance needed for ambulating in the community.   Baseline: 1028 feet with SBA-CGA Goal status: INITIAL  3.  Patient will improve Berg Balance score by 5 points to indicate clinically  significant progress towards a decreased risk of falls and improved static stability.   Baseline: 38/56 Goal status: INITIAL   LONG TERM GOALS: Target date: 05/30/2022   Pt will be independent with final HEP for improved strength, balance, transfers and gait.  Baseline:  Goal status: INITIAL  2.  Pt will improve gait velocity to at least 3.0 ft/s w/LRAD at S* level for improved gait efficiency  Baseline: 2.7 ft/s w/SBQC  Goal status: INITIAL  3.  Patient will improve Berg Balance score to 45/56 or greater to indicate a decreased risk of falls and improved static stability.   Baseline: 38/56 Goal status: INITIAL  4.  Patient will improve 6MWT score by 160 feet to demonstrate a clinically meaningful improvement in aerobic capacity and endurance needed for ambulating in the community and progress towards age matched norms.   Baseline:1028 feet with SBA-CGA  Goal status: INITIAL  5.  Pt will improve normal TUG to less than or equal to 12 seconds w/LRAD for improved functional mobility and decreased fall risk.  Baseline: 14.6s w/SBQC Goal status: INITIAL   ASSESSMENT:  CLINICAL IMPRESSION: Emphasis of skilled PT session on pt education and addressing severity of pt's cognitive deficits and their contribution to pt's fall risk. Despite significant encouragement from therapist, pt declining SLP and OT at this time and reports he would prefer "walking it off". Pt has significant short term memory loss, decreased word finding and impaired safety awareness, placing him  at a high fall risk. Pt and wife seemingly unaware of severity of pt's deficits. Will continue to work on safety at home during PT and encourage SLP/OT. Continue POC.    OBJECTIVE IMPAIRMENTS: Abnormal gait, decreased activity tolerance, decreased balance, decreased cognition, decreased coordination, decreased knowledge of condition, decreased knowledge of use of DME, difficulty walking, decreased safety awareness, and  improper body mechanics  ACTIVITY LIMITATIONS: carrying, lifting, squatting, stairs, transfers, locomotion level, and caring for others  PARTICIPATION LIMITATIONS: meal prep, cleaning, laundry, interpersonal relationship, driving, shopping, community activity, and yard work  PERSONAL FACTORS: Age, Behavior pattern, Fitness, Past/current experiences, and 1 comorbidity: Hx of CVA  are also affecting patient's functional outcome.   REHAB POTENTIAL: Good  CLINICAL DECISION MAKING: Stable/uncomplicated  EVALUATION COMPLEXITY: Low  PLAN:  PT FREQUENCY: 1x/week  PT DURATION: 8 weeks  PLANNED INTERVENTIONS: Therapeutic exercises, Therapeutic activity, Neuromuscular re-education, Balance training, Gait training, Patient/Family education, Self Care, Joint mobilization, Stair training, DME instructions, Manual therapy, and Re-evaluation  PLAN FOR NEXT SESSION: Practice lifitng items from ground and reaching overhead. Implement official walking program. progress initial HEP, tandem balance, dynamic balance tasks with stepping strategy, walking for endurance/sciFit, LE strengthening   Bartley Vuolo E Suzannah Bettes, PT, DPT 04/18/2022, 1:16 PM

## 2022-04-22 ENCOUNTER — Ambulatory Visit (HOSPITAL_BASED_OUTPATIENT_CLINIC_OR_DEPARTMENT_OTHER): Payer: Medicare Other | Admitting: Cardiology

## 2022-04-22 NOTE — Progress Notes (Deleted)
Cardiology Office Note:    Date:  04/22/2022   ID:  Edwin Wall, DOB 11-21-1940, MRN [id number]  PCP:  [name], MD   [place] [facility] Providers Cardiologist:  Fransico Him, MD { Click to update primary MD,subspecialty MD or APP then REFRESH:1}    Referring MD: [name], MD   No chief complaint on file. ***  History of Present Illness:    Edwin Wall is a 82 y.o. male with a hx of hypertension, HFrEF, permanent atrial fibrillation (CHA2DS2-VASc 5, on Eliquis), CVA, NICM, colon cancer (S/P sigmoid colectomy 2018), DM2, gout, HLD, CKD.     He was admitted 03/05/2022 to 03/10/2022.  Initially presented to the ED with weakness and recurrent falls, it was recommended that he go to SNF following a previous hospitalization in December however the patient and the family opted to return home.  The family requested that he be admitted for assistance with placing him in a facility at discharge.  Past Medical History:  Diagnosis Date   Anxiety    Arthritis    Bilateral leg edema    Cataract    Colon cancer ([place]) dx'd 11/2016   "stage 3"   Gout    Gout    Hemorrhoids    History of kidney stones    "passed it"   Hypertension    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[place])    EF 45-50% by echo 10/22   PAF (paroxysmal atrial fibrillation) ([place])    Pre-diabetes     Past Surgical History:  Procedure Laterality Date   CARDIOVERSION [name] 04/23/2021   Procedure: CARDIOVERSION;  Surgeon: [name], MD;  Location: [facility] ENDOSCOPY;  Service: Cardiovascular;  Laterality: [name];   COLECTOMY  12/17/2016   lap; partial sigmoid colectomy/notes 12/17/2016   COLONOSCOPY W/ BIOPSIES AND POLYPECTOMY  11/11/2016   LAPAROSCOPIC SIGMOID COLECTOMY [name] 12/17/2016   Procedure: LAPAROSCOPIC SIGMOID COLECTOMY;  Surgeon: [name], MD;  Location: [place];  Service: General;  Laterality: [name];   LEFT HEART CATH AND CORONARY ANGIOGRAPHY [name] 03/11/2021   Procedure:  LEFT HEART CATH AND CORONARY ANGIOGRAPHY;  Surgeon: [name], MD;  Location: [place] CV LAB;  Service: Cardiovascular;  Laterality: [name];   NASAL SEPTUM SURGERY     TONSILLECTOMY      Current Medications: No outpatient medications have been marked as taking for the 04/22/22 encounter (Appointment) with Trudi Ida, NP.     Allergies:   Patient has no known allergies.   Social History   Socioeconomic History   Marital status: Married    Spouse name: Not on file   Number of children: Not on file   Years of education: Not on file   Highest education level: Not on file  Occupational History   Not on file  Tobacco Use   Smoking status: Never   Smokeless tobacco: Never   Tobacco comments:    Never smoke 05/21/21  Vaping Use   Vaping Use: Never used  Substance and Sexual Activity   Alcohol use: No   Drug use: No   Sexual activity: Never  Other Topics Concern   Not on file  Social History Narrative   Right and left handed   Lives with son one story   Caffeine 2 daily   V[employer]an of [employer]    Social Determinants of Health   Financial Resource Strain: Not on file  Food Insecurity: No Food Insecurity (03/06/2022)   Hunger Vital Sign    Worried About  Running Out of Food in the Last Year: Never true    [place] in the Last Year: Never true  Transportation Needs: No Transportation Needs (03/06/2022)   PRAPARE - Hydrologist (Medical): No    Lack of Transportation (Non-Medical): No  Physical Activity: Not on file  Stress: Not on file  Social Connections: Not on file     Family History: The patient's ***family history includes COPD in his father; Cancer in his mother.  ROS:   Please see the history of present illness.    *** All other systems reviewed and are negative.  EKGs/Labs/Other Studies Reviewed:    The following studies were reviewed today: ***  EKG:  EKG is *** ordered today.  The ekg ordered today demonstrates  ***  Recent Labs: 02/25/2022: TSH 1.754 03/05/2022: ALT 78 03/06/2022: B Natriuretic Peptide 121.3; BUN 26; Creatinine, Ser 1.47; Hemoglobin 13.8; Magnesium 1.8; Platelets 199; Potassium 4.5; Sodium 141  Recent Lipid Panel    Component Value Date/Time   CHOL 101 06/12/2021 0939   TRIG 67 06/12/2021 0939   HDL 46 06/12/2021 0939   CHOLHDL 2.2 06/12/2021 0939   CHOLHDL 5.3 07/16/2020 0337   VLDL 18 07/16/2020 0337   LDLCALC 40 06/12/2021 0939     Risk Assessment/Calculations:   {Does this patient have ATRIAL FIBRILLATION?:[phone]}  No BP recorded.  {Refresh Note OR Click here to enter BP  :1}***         Physical Exam:    VS:  There were no vitals taken for this visit.    Wt Readings from Last 3 Encounters:  03/05/22 200 lb (90.7 kg)  03/01/22 203 lb 0.7 oz (92.1 kg)  12/13/21 203 lb (92.1 kg)     GEN: *** Well nourished, well developed in no acute distress HEENT: Normal NECK: No JVD; No carotid bruits LYMPHATICS: No lymphadenopathy CARDIAC: ***RRR, no murmurs, rubs, gallops RESPIRATORY:  Clear to auscultation without rales, wheezing or rhonchi  ABDOMEN: Soft, non-tender, non-distended MUSCULOSKELETAL:  No edema; No deformity  SKIN: Warm and dry NEUROLOGIC:  Alert and oriented x 3 PSYCHIATRIC:  Normal affect   ASSESSMENT:    No diagnosis found. PLAN:    In order of problems listed above:  ***      {Are you ordering a CV Procedure (e.g. stress test, cath, DCCV, TEE, etc)?   Press F2        :[id number]    Medication Adjustments/Labs and Tests Ordered: Current medicines are reviewed at length with the patient today.  Concerns regarding medicines are outlined above.  No orders of the defined types were placed in this encounter.  No orders of the defined types were placed in this encounter.   There are no Patient Instructions on file for this visit.   Signed, Trudi Ida, NP  04/22/2022 8:35 AM    [place]

## 2022-04-23 ENCOUNTER — Ambulatory Visit: Payer: Medicare Other | Attending: Family | Admitting: Cardiology

## 2022-04-23 ENCOUNTER — Encounter: Payer: Self-pay | Admitting: Cardiology

## 2022-04-23 VITALS — BP 110/62 | HR 60 | Ht 68.5 in | Wt 206.0 lb

## 2022-04-23 DIAGNOSIS — R600 Localized edema: Secondary | ICD-10-CM

## 2022-04-23 DIAGNOSIS — I2583 Coronary atherosclerosis due to lipid rich plaque: Secondary | ICD-10-CM

## 2022-04-23 DIAGNOSIS — I2510 Atherosclerotic heart disease of native coronary artery without angina pectoris: Secondary | ICD-10-CM

## 2022-04-23 DIAGNOSIS — I4819 Other persistent atrial fibrillation: Secondary | ICD-10-CM

## 2022-04-23 DIAGNOSIS — I10 Essential (primary) hypertension: Secondary | ICD-10-CM

## 2022-04-23 DIAGNOSIS — I639 Cerebral infarction, unspecified: Secondary | ICD-10-CM

## 2022-04-23 DIAGNOSIS — E7800 Pure hypercholesterolemia, unspecified: Secondary | ICD-10-CM | POA: Diagnosis present

## 2022-04-23 DIAGNOSIS — I428 Other cardiomyopathies: Secondary | ICD-10-CM | POA: Diagnosis present

## 2022-04-23 NOTE — Patient Instructions (Signed)
Medication Instructions:  Your physician recommends that you continue on your current medications as directed. Please refer to the Current Medication list given to you today.  *If you need a refill on your cardiac medications before your next appointment, please call your pharmacy*   Lab Work: Please complete a CMET and a FASTING lipid panel in our lab today before you leave.  If you have labs (blood work) drawn today and your tests are completely normal, you will receive your results only by: [place] (if you have MyChart) OR A paper copy in the mail If you have any lab test that is abnormal or we need to change your treatment, we will call you to review the results.   Testing/Procedures: None.   Follow-Up: At [facility], you and your health needs are our priority.  As part of our continuing mission to provide you with exceptional heart care, we have created designated Provider Care Teams.  These Care Teams include your primary Cardiologist (physician) and Advanced Practice Providers (APPs -  Physician Assistants and Nurse Practitioners) who all work together to provide you with the care you need, when you need it.  We recommend signing up for the patient portal called "MyChart".  Sign up information is provided on this After Visit Summary.  MyChart is used to connect with patients for Virtual Visits (Telemedicine).  Patients are able to view lab/test results, encounter notes, upcoming appointments, etc.  Non-urgent messages can be sent to your provider as well.   To learn more about what you can do with MyChart, go to [email or web address].    Your next appointment:   6 month(s)  Provider:   Fransico Him, MD     Other Instructions Please use the physical script provided by Dr. Radford Pax to obtain compression hose.

## 2022-04-23 NOTE — Addendum Note (Signed)
Addended by: Joni Reining on: 04/23/2022 03:31 PM   Modules accepted: Orders

## 2022-04-23 NOTE — Progress Notes (Addendum)
Cardiology Note    Date:  04/23/2022   ID:  Edwin Wall, DOB 12-04-1940, MRN [id number]  PCP:  [name], MD  Cardiologist:  Fransico Him, MD   Chief Complaint  Patient presents with   Coronary Artery Disease   Hyperlipidemia   Atrial Fibrillation   Congestive Heart Failure   Cardiomyopathy    History of Present Illness:  Edwin Wall is a 82 y.o. male with a history of stage III colon cancer, gout, hypertension, prediabetes and PAF.  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  He was started on apixaban 5 mg twice daily for a CHA2DS2-VASc 2 score of now 5.  2D echocardiogram was done 07/16/2020 showing low normal LV function with EF 50 to 55%, mild left atrial enlargement, mild MR. Repeat 2D echocardiogram 12/24/2020 showed EF 45 to 50% with mild LV enlargement and severe akinesis of the apical anterior septal wall and apical segment.  Left atrium was moderately dilated and right atrium mildly dilated.  There was mild MR.  He was admitted to the hospital in November 2022 with syncope that was felt likely more related to weakness rather than loss of consciousness.  Troponin was 21.  It was felt that his issues were due to weight loss, CVA and cancer not from an arrhythmia.  He underwent nuclear stress test which was intermediate risk with peri-infarct ischemia in the basal to apical inferior wall with EF 30 to 44%.    He underwent cardiac cath on 03/11/2021 which showed mild to moderate nonobstructive coronary disease with mildly elevated LVEDP.  There was a 40% proximal RCA, 20% proximal D1, 30% proximal D2, 40% mid LAD and severe disease in the small D3.  Medical management was recommended.  14-day event monitor was ordered for syncope but it does not appear that this was ever followed through on.    He was seen in A-fib clinic on 03/21/2021 and underwent cardioversion on 04/22/2021.  He was  seen back in afib clinic 08/2021 and was back in afib and decision by patient was not to try ADDT and to pursue rate control strategy going forward.   He is here today for followup and is doing well.  He denies any chest pain or pressure, SOB, DOE, PND, orthopnea, dizziness, palpitations or syncope. He has some occasional LE edema. He is compliant with hiw meds and is tolerating meds with no SE.    Past Medical History:  Diagnosis Date   Anxiety    Arthritis    Bilateral leg edema    Cataract    Colon cancer ([place]) dx'd 11/2016   "stage 3"   Gout    Gout    Hemorrhoids    History of kidney stones    "passed it"   Hypertension    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[place])    EF 45-50% by echo 10/22   PAF (paroxysmal atrial fibrillation) ([place])    Pre-diabetes     Past Surgical History:  Procedure Laterality Date   CARDIOVERSION [name] 04/23/2021   Procedure: CARDIOVERSION;  Surgeon: [name], MD;  Location: [facility] ENDOSCOPY;  Service: Cardiovascular;  Laterality: [name];   COLECTOMY  12/17/2016   lap; partial sigmoid colectomy/notes 12/17/2016   COLONOSCOPY W/ BIOPSIES AND POLYPECTOMY  11/11/2016   LAPAROSCOPIC SIGMOID COLECTOMY [name] 12/17/2016   Procedure: LAPAROSCOPIC SIGMOID COLECTOMY;  Surgeon: [name], MD;  Location: [place];  Service: General;  Laterality: [name];   LEFT HEART CATH AND CORONARY ANGIOGRAPHY [name] 03/11/2021   Procedure: LEFT HEART CATH AND CORONARY ANGIOGRAPHY;  Surgeon: [name], MD;  Location: [place] CV LAB;  Service: Cardiovascular;  Laterality: [name];   NASAL SEPTUM SURGERY     TONSILLECTOMY      Current Medications: Current Meds  Medication Sig   apixaban (ELIQUIS) 2.5 MG TABS tablet Take 1 tablet (2.5 mg total) by mouth 2 (two) times daily.   colestipol (COLESTID) 1 g tablet Take 1 g by mouth daily.   diclofenac Sodium (VOLTAREN) 1 % GEL Apply 2 g topically 4 (four) times daily as needed (aches).   ezetimibe (ZETIA) 10 MG tablet Take 1  tablet (10 mg total) by mouth daily.   furosemide (LASIX) 20 MG tablet Take 1 tablet (20 mg total) by mouth every other day.   liver oil-zinc oxide (DESITIN) 40 % ointment Apply 1 application  topically as needed for irritation.   neomycin-bacitracin-polymyxin (NEOSPORIN) ointment Apply 1 application  topically as needed for wound care.   Phenylephrine HCl (NEO-SYNEPHRINE NA) Place 1 spray into the nose daily as needed (congestion).   potassium chloride (KLOR-CON M) 10 MEQ tablet Take 10 mEq by mouth daily.   rosuvastatin (CRESTOR) 40 MG tablet Take 1 tablet (40 mg total) by mouth daily.    Allergies:   Patient has no known allergies.   Social History   Socioeconomic History   Marital status: Married    Spouse name: Not on file   Number of children: Not on file   Years of education: Not on file   Highest education level: Not on file  Occupational History   Not on file  Tobacco Use   Smoking status: Never   Smokeless tobacco: Never   Tobacco comments:    Never smoke 05/21/21  Vaping Use   Vaping Use: Never used  Substance and Sexual Activity   Alcohol use: No   Drug use: No   Sexual activity: Never  Other Topics Concern   Not on file  Social History Narrative   Right and left handed   Lives with son one story   Caffeine 2 daily   V[employer] of [employer]    Social Determinants of Health   Financial Resource Strain: Not on file  Food Insecurity: No Food Insecurity (03/06/2022)   Hunger Vital Sign    Worried About Running Out of Food in the Last Year: Never true    Ran Out of Food in the Last Year: Never true  Transportation Needs: No Transportation Needs (03/06/2022)   PRAPARE - Hydrologist (Medical): No    Lack of Transportation (Non-Medical): No  Physical Activity: Not on file  Stress: Not on file  Social Connections: Not on file     Family History:  The patient's family history includes COPD in his father; Cancer in his mother.   ROS:    Please see the history of present illness.    ROS All other systems reviewed and are negative.      No data to display             PHYSICAL EXAM:   VS:  BP 110/62   Pulse 60   Ht 5' 8.5" (1.74 m)   Wt 206 lb (93.4 kg)   SpO2 97%   BMI 30.87 kg/m    GEN: Well nourished, well developed in no acute distress HEENT: Normal NECK: No JVD; No  carotid bruits LYMPHATICS: No lymphadenopathy CARDIAC:irregularly irregular, no murmurs, rubs, gallops RESPIRATORY:  Clear to auscultation without rales, wheezing or rhonchi  ABDOMEN: Soft, non-tender, non-distended MUSCULOSKELETAL:  2+ BLE edema R>L; No deformity  SKIN: Warm and dry NEUROLOGIC:  Alert and oriented x 3 PSYCHIATRIC:  Normal affect  Wt Readings from Last 3 Encounters:  04/23/22 206 lb (93.4 kg)  03/05/22 200 lb (90.7 kg)  03/01/22 203 lb 0.7 oz (92.1 kg)      Studies/Labs Reviewed:   EKG:  EKG is not ordered today.  Recent Labs: 02/25/2022: TSH 1.754 03/05/2022: ALT 78 03/06/2022: B Natriuretic Peptide 121.3; BUN 26; Creatinine, Ser 1.47; Hemoglobin 13.8; Magnesium 1.8; Platelets 199; Potassium 4.5; Sodium 141   Lipid Panel    Component Value Date/Time   CHOL 101 06/12/2021 0939   TRIG 67 06/12/2021 0939   HDL 46 06/12/2021 0939   CHOLHDL 2.2 06/12/2021 0939   CHOLHDL 5.3 07/16/2020 0337   VLDL 18 07/16/2020 0337   LDLCALC 40 06/12/2021 0939     Additional studies/ records that were reviewed today include:  OV notes from PCP    ASSESSMENT:    1. Persistent atrial fibrillation ([place])   2. Primary hypertension   3. Acute CVA (cerebrovascular accident) ([place])   4. Bilateral leg edema   5. 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[place])   6. Coronary artery disease due to lipid rich plaque   7. Pure hypercholesterolemia      PLAN:  In order of problems listed above:  Persistent atrial fibrillation -He has been followed in atrial fibrillation clinic -Status post DCCV to normal sinus rhythm on  04/23/2021 -See back in A-fib clinic June 2023 and was back in A-fib.   -AADT was discussed but patient's desire was to pursue a conservative rate control strategy -He has not had any bleeding problems on DOAC -2D echo showed moderate left atrial enlargement -Continue prescription drug management with Eliquis 2.5 mg twice daily  for CHADS2 Vascor 5.  His SCr has been borderline>>it was > 1.5 In Dec when he had COVID 19 but since then has been 1.3 to 1.47.  I will repeat today and if < 1.5 will increase Eliquis to 29m BID -I have personally reviewed and interpreted outside labs performed by patient's PCP which showed serum creatinine 1.47 and hemoglobin 13.8 on 03/06/2022  2  Hypertension -BP is controlled on exam today -He has not required any blood pressure medicine  3.  History of L MCA -occurred in setting of new onset atrial fibrillation -Continue Eliquis 2.5 mg twice daily  4.  LE edema -likely exacerbated by atrial fibrillation -I have given him a Rx for compression hose and encouraged him to wear them everyday -continue Lasix 254mdaily  5.  Nonischemic dilated cardiomyopathy -nuclear stress test done for syncope was intermediate risk with peri-infarct ischemia in the basal to apical inferior wall with EF 30 to 44%.   -cardiac cath on 03/11/2021 showed mild to moderate nonobstructive coronary disease with mildly elevated LVEDP.  There was a 40% proximal RCA, 20% proximal D1, 30% proximal D2, 40% mid LAD and severe disease in the small D3.   -2D echo 12/24/2020 showed mild LV dysfunction with EF 45 to 50% with severe akinesis of the apical anterior septal and apical segments. -2D echo 02/25/22 with EF 45-50% -No ACE/ARB/Entresto due to CKD -BP has been to soft in the past for hydralazine/Imdur/SGLT2 inhibitor -continue Lasix 2018maily  6.  ASCAD -cardiac cath on 03/11/2021 which showed  mild to moderate nonobstructive coronary disease with mildly elevated LVEDP.  There was a 40%  proximal RCA, 20% proximal D1, 30% proximal D2, 40% mid LAD and severe disease in the small D3.  Medical management was recommended -He has not had any anginal symptoms -No aspirin due to DOAC -Continue statin therapy -No beta-blocker due to soft BP in the past  7.  Hyperlipidemia -LDL goal less than 70 -Check FLP and ALT -Continue prescription drug management with Crestor 40 mg daily with as needed refills  Followup with me in 6 months  Medication Adjustments/Labs and Tests Ordered: Current medicines are reviewed at length with the patient today.  Concerns regarding medicines are outlined above.  Medication changes, Labs and Tests ordered today are listed in the Patient Instructions below.  There are no Patient Instructions on file for this visit.   Signed, Fransico Him, MD  04/23/2022 3:08 PM    [facility] [place], [place], [place]  [address] Phone: [phone]; Fax: [phone]

## 2022-04-24 LAB — COMPREHENSIVE METABOLIC PANEL
ALT: 18 IU/L (ref 0–44)
AST: 22 IU/L (ref 0–40)
Albumin/Globulin Ratio: 1.6 (ref 1.2–2.2)
Albumin: 3.6 g/dL — ABNORMAL LOW (ref 3.7–4.7)
Alkaline Phosphatase: 94 IU/L (ref 44–121)
BUN/Creatinine Ratio: 14 (ref 10–24)
BUN: 18 mg/dL (ref 8–27)
Bilirubin Total: 0.9 mg/dL (ref 0.0–1.2)
CO2: 24 mmol/L (ref 20–29)
Calcium: 9.3 mg/dL (ref 8.6–10.2)
Chloride: 107 mmol/L — ABNORMAL HIGH (ref 96–106)
Creatinine, Ser: 1.29 mg/dL — ABNORMAL HIGH (ref 0.76–1.27)
Globulin, Total: 2.2 g/dL (ref 1.5–4.5)
Glucose: 84 mg/dL (ref 70–99)
Potassium: 4 mmol/L (ref 3.5–5.2)
Sodium: 144 mmol/L (ref 134–144)
Total Protein: 5.8 g/dL — ABNORMAL LOW (ref 6.0–8.5)
eGFR: 56 mL/min/{1.73_m2} — ABNORMAL LOW (ref >59)

## 2022-04-24 LAB — LIPID PANEL
Chol/HDL Ratio: 2.2 ratio (ref 0.0–5.0)
Cholesterol, Total: 93 mg/dL — ABNORMAL LOW (ref 100–199)
HDL: 42 mg/dL (ref >39)
LDL Chol Calc (NIH): 35 mg/dL (ref 0–99)
Triglycerides: 78 mg/dL (ref 0–149)
VLDL Cholesterol Cal: 16 mg/dL (ref 5–40)

## 2022-04-25 ENCOUNTER — Ambulatory Visit: Payer: Medicare Other | Admitting: Physical Therapy

## 2022-05-02 ENCOUNTER — Ambulatory Visit: Payer: Medicare Other | Attending: Internal Medicine | Admitting: Physical Therapy

## 2022-05-02 ENCOUNTER — Telehealth: Payer: Self-pay | Admitting: Physical Therapy

## 2022-05-02 DIAGNOSIS — I639 Cerebral infarction, unspecified: Secondary | ICD-10-CM

## 2022-05-02 DIAGNOSIS — R4701 Aphasia: Secondary | ICD-10-CM | POA: Diagnosis present

## 2022-05-02 DIAGNOSIS — R2681 Unsteadiness on feet: Secondary | ICD-10-CM | POA: Insufficient documentation

## 2022-05-02 DIAGNOSIS — M6281 Muscle weakness (generalized): Secondary | ICD-10-CM | POA: Diagnosis present

## 2022-05-02 DIAGNOSIS — R2689 Other abnormalities of gait and mobility: Secondary | ICD-10-CM | POA: Diagnosis present

## 2022-05-02 DIAGNOSIS — R278 Other lack of coordination: Secondary | ICD-10-CM | POA: Insufficient documentation

## 2022-05-02 DIAGNOSIS — R41840 Attention and concentration deficit: Secondary | ICD-10-CM | POA: Diagnosis not present

## 2022-05-02 DIAGNOSIS — R41841 Cognitive communication deficit: Secondary | ICD-10-CM | POA: Diagnosis present

## 2022-05-02 NOTE — Therapy (Signed)
OUTPATIENT PHYSICAL THERAPY NEURO TREATMENT   Patient Name: Edwin Wall MRN: [id number] DOB:August 20, 1940, 82 y.o., male Today's Date: 05/02/2022   PCP: [name], MD REFERRING PROVIDER: [name], MD  END OF SESSION:  PT End of Session - 05/02/22 1315     Visit Number 4    Number of Visits 9    Date for PT Re-Evaluation 06/06/22    Authorization Type Medicare A & B    PT Start Time 1314    PT Stop Time 1402    PT Time Calculation (min) 48 min    Equipment Utilized During Treatment Gait belt    Activity Tolerance Patient tolerated treatment well    Behavior During Therapy WFL for tasks assessed/performed               Past Medical History:  Diagnosis Date   Anxiety    Arthritis    Bilateral leg edema    Cataract    Colon cancer ([place]) dx'd 11/2016   "stage 3"   Gout    Gout    Hemorrhoids    History of kidney stones    "passed it"   Hypertension    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[place])    EF 45-50% by echo 10/22   PAF (paroxysmal atrial fibrillation) ([place])    Pre-diabetes    Past Surgical History:  Procedure Laterality Date   CARDIOVERSION [name] 04/23/2021   Procedure: CARDIOVERSION;  Surgeon: [name], MD;  Location: [facility];  Service: Cardiovascular;  Laterality: [name];   COLECTOMY  12/17/2016   lap; partial sigmoid colectomy/notes 12/17/2016   COLONOSCOPY W/ BIOPSIES AND POLYPECTOMY  11/11/2016   LAPAROSCOPIC SIGMOID COLECTOMY [name] 12/17/2016   Procedure: LAPAROSCOPIC SIGMOID COLECTOMY;  Surgeon: [name], MD;  Location: [facility];  Service: General;  Laterality: [name];   LEFT HEART CATH AND CORONARY ANGIOGRAPHY [name] 03/11/2021   Procedure: LEFT HEART CATH AND CORONARY ANGIOGRAPHY;  Surgeon: [name], MD;  Location: [facility] CV LAB;  Service: Cardiovascular;  Laterality: [name];   NASAL SEPTUM SURGERY     TONSILLECTOMY     Patient Active Problem List   Diagnosis Date Noted   Recurrent falls 03/06/2022    COVID-19 03/05/2022   Physical deconditioning 03/05/2022   Non-traumatic rhabdomyolysis 02/28/2022   Acute on chronic heart failure ([place]) 02/25/2022   Pneumonia due to COVID-19 virus 02/25/2022   Bilateral leg edema 04/18/2021   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[place]) 04/18/2021   AMS (altered mental status) 04/14/2021   Syncope and collapse 03/11/2021   Abnormal nuclear stress test 03/11/2021   Protein-calorie malnutrition, severe 01/18/2021   Orthostatic hypotension    Fall    Weight loss    Secondary hypercoagulable state ([place]) 01/02/2021   LOC (loss of consciousness) ([place]) 12/23/2020   Abnormality of gait 09/10/2020   Leukocytosis    Somnolence    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/2020   Acute right hemiparesis ([place]) 07/19/2020   Left middle cerebral artery stroke ([place]) 07/18/2020   AKI (acute kidney injury) ([place])    Prediabetes    PAF (paroxysmal atrial fibrillation) ([place])    Dysphagia    Hyperlipidemia 07/16/2020   Type II diabetes mellitus ([place]) 07/16/2020   Atrial fibrillation ([place]) 07/16/2020   Acute CVA (cerebrovascular accident) ([place]) 07/15/2020   Cancer of sigmoid colon ([place]) 12/17/2016   Obesity (BMI 30-39.9) 01/15/2011   Hypertension 01/15/2011   History of gout 01/15/2011   ED (erectile dysfunction) 01/15/2011    ONSET DATE: 02/28/2022  REFERRING DIAG: [name]  on feet (R26.81);Muscle weakness (generalized) (M62.81);Difficulty in walking, not elsewhere classified (R26.2)  THERAPY DIAG:  Attention and concentration deficit  Other abnormalities of gait and mobility  Rationale for Evaluation and Treatment: Habilitation  SUBJECTIVE:                                                                                                                                                                                             SUBJECTIVE STATEMENT: Pt ambulated into clinic without AD. Reports doing okay, doing HEP about 1x/week. No falls   Pt accompanied by:  Wife, Edwin Wall    PERTINENT HISTORY: CVA, HFrEF, Afib, HLD, seizures, CKD  PAIN:  Are you having pain? No  PRECAUTIONS: Fall  WEIGHT BEARING RESTRICTIONS: No  FALLS: Has patient fallen in last 6 months? Yes. Number of falls at least 2  LIVING ENVIRONMENT: Lives with: lives with their family and lives with their spouse Lives in: House/apartment Stairs: Yes: Internal: full flight steps; can reach both and External: 4 in front w/rails and 2 in back without rails steps; none Has following equipment at home: Quad cane small base, Walker - 2 wheeled, Walker - 4 wheeled, shower chair, bed side commode, Grab bars, and is obtaining a WC  PLOF: Requires assistive device for independence, Needs assistance with homemaking, and Needs assistance with gait  PATIENT GOALS: Pt pointed to feet when asked question   OBJECTIVE:   DIAGNOSTIC FINDINGS: CT of head on 03/05/22  IMPRESSION: 1. No acute intracanial process. Unchanged large region of encephalomalacia along the left frontal lobe, frontal operculum, and portions of the left insular cortex. 2. No acute fracture or traumatic listhesis in the cervical, thoracic, or lumbar spine. 3. 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 4. At least moderate spinal canal stenosis at C6-C7. 5. Diffuse clustered nodularity in the periphery of the bilateral upper and lower lobes, which may be infectious or inflammatory.  COGNITION: Overall cognitive status: Impaired   TODAY'S TREATMENT:    Ther Ex SciFit multi-peaks level 5 for 8 minutes using BUE/BLEs for neural priming for reciprocal movement, dynamic cardiovascular warmup and global strength. Min cues to maintain steps/min >70 and to put forth 6-7/10 RPE. Pt rated RPE of 0/10 following activity   Ther Act  STG Assessment     [facility] PT Assessment - 05/02/22 1327       Berg Balance Test   Sit to Stand  Able to stand without using hands and stabilize independently    Standing Unsupported Able to stand safely 2 minutes  Sitting with Back Unsupported but Feet Supported on Floor or Stool Able to sit safely and securely 2 minutes    Stand to Sit Controls descent by using hands    Transfers Able to transfer safely, definite need of hands    Standing Unsupported with Eyes Closed Able to stand 10 seconds safely    Standing Unsupported with Feet Together Able to place feet together independently and stand 1 minute safely    From Standing, Reach Forward with Outstretched Arm Can reach forward >12 cm safely (5")   max cues to perform   From Standing Position, Pick up Object from [place] to pick up shoe safely and easily    From Standing Position, Turn to Look Behind Over each Shoulder Looks behind from both sides and weight shifts well    Turn 360 Degrees Able to turn 360 degrees safely but slowly   7.13s   Standing Unsupported, Alternately Place Feet on Step/Stool Able to stand independently and complete 8 steps >20 seconds    Standing Unsupported, One Foot in Front Able to take small step independently and hold 30 seconds    Standing on One Leg Tries to lift leg/unable to hold 3 seconds but remains standing independently    Total Score 45    Berg comment: Moderate fall risk              6MWT  Gait pattern: step through pattern, decreased arm swing- Right, decreased stride length, and lateral lean- Right Distance walked: 67' Assistive device utilized: None Level of assistance: SBA Comments: Cued pt to give 7/10 effort on test, but pt reported 0/10 RPE following test and demonstrated poor selective attention throughout test, speaking to other pts and his wife despite max cues to focus on test.   Provided education to pt and wife regarding goal outcomes and pt's deficits being more related to cognition than balance. Continue to encourage pt to complete SLP eval due to impaired saefty  awareness, attention and word-finding. Pt in agreement to schedule SLP eval at this time.                                         PATIENT EDUCATION: Education details: Continue HEP, see above Person educated: Patient and Spouse Education method: Explanation, Demonstration, and Verbal cues Education comprehension: needs further education  HOME EXERCISE PROGRAM: Access Code: [email or web address] URL: [email or web address] Date: 04/11/2022 Prepared by: Malachi Carl  Exercises - Hip Swing  - 1 x daily - 7 x weekly - 3 sets - 10 reps - Semi-Tandem Balance at [employer] Eyes Open  - 1 x daily - 7 x weekly - 3 sets - 30 hold  GOALS: Goals reviewed with patient? Yes  SHORT TERM GOALS: Target date: 05/02/2022   Pt will perform initial HEP w/S* from wife for improved strength, balance, transfers and gait.  Baseline: not established on eval  Goal status: MET  2.  Patient will improve 6MWT score by 112 feet to demonstrate a clinically meaningful improvement in aerobic capacity and endurance needed for ambulating in the community.   Baseline: 1028 feet with SBA-CGA; 983' w/SBA on 3/1 Goal status: NOT MET  3.  Patient will improve Berg Balance score by 5 points to indicate clinically significant progress towards a decreased risk of falls and improved static stability.   Baseline: 38/56; 45/56 on 3/1 Goal status: MET   LONG  TERM GOALS: Target date: 05/30/2022   Pt will be independent with final HEP for improved strength, balance, transfers and gait.  Baseline:  Goal status: INITIAL  2.  Pt will improve gait velocity to at least 3.0 ft/s w/LRAD at S* level for improved gait efficiency  Baseline: 2.7 ft/s w/SBQC  Goal status: INITIAL  3.  Patient will improve Berg Balance score to 45/56 or greater to indicate a decreased risk of falls and improved static stability.   Baseline: 38/56; 45/56 on 3/1 Goal status: MET  4.  Patient will improve 6MWT score by 160 feet to demonstrate a  clinically meaningful improvement in aerobic capacity and endurance needed for ambulating in the community and progress towards age matched norms.   Baseline:1028 feet with SBA-CGA  Goal status: INITIAL  5.  Pt will improve normal TUG to less than or equal to 12 seconds w/LRAD for improved functional mobility and decreased fall risk.  Baseline: 14.6s w/SBQC Goal status: INITIAL   ASSESSMENT:  CLINICAL IMPRESSION: Emphasis of skilled PT session on STG assessment and pt education. Pt has met 2 of 3 STGs, improving his Berg to 45/56, indicative of medium fall risk. At this time, pt has reached his max potential on Berg, meeting his LTG as well. Pt reports he is performing his HEP 1x/week and is relying on PT to be his "workout". Informed pt and wife that pt must be doing exercises at home in order to improve and there is currently no effort being put into therapy sessions. Strongly encouraged pt to complete SLP eval as his impaired cognition is strongly contributing to his falls. Continue POC.    OBJECTIVE IMPAIRMENTS: Abnormal gait, decreased activity tolerance, decreased balance, decreased cognition, decreased coordination, decreased knowledge of condition, decreased knowledge of use of DME, difficulty walking, decreased safety awareness, and improper body mechanics  ACTIVITY LIMITATIONS: carrying, lifting, squatting, stairs, transfers, locomotion level, and caring for others  PARTICIPATION LIMITATIONS: meal prep, cleaning, laundry, interpersonal relationship, driving, shopping, community activity, and yard work  PERSONAL FACTORS: Age, Behavior pattern, Fitness, Past/current experiences, and 1 comorbidity: Hx of CVA  are also affecting patient's functional outcome.   REHAB POTENTIAL: Good  CLINICAL DECISION MAKING: Stable/uncomplicated  EVALUATION COMPLEXITY: Low  PLAN:  PT FREQUENCY: 1x/week  PT DURATION: 8 weeks  PLANNED INTERVENTIONS: Therapeutic exercises, Therapeutic activity,  Neuromuscular re-education, Balance training, Gait training, Patient/Family education, Self Care, Joint mobilization, Stair training, DME instructions, Manual therapy, and Re-evaluation  PLAN FOR NEXT SESSION: Update HEP, Practice lifitng items from ground and reaching overhead. Implement official walking program. progress initial HEP, tandem balance, dynamic balance tasks with stepping strategy, walking for endurance/sciFit, LE strengthening   Heidemarie Goodnow E Lilyahna Sirmon, PT, DPT 05/02/2022, 2:03 PM

## 2022-05-02 NOTE — Telephone Encounter (Signed)
Dr. Cain Sieve,  Edwin Wall was evaluated by physical therapy on 04/04/22.  The patient would benefit from a speech therapy evaluation for difficulty word-finding and cognitive deficits.    If you agree, please place an order in [facility] workque in [facility] or fax the order to [phone].   Thank you, Francena Hanly, PT, [place] [address] [place], [place]  [address] Phone:  [phone] Fax:  [phone]

## 2022-05-05 ENCOUNTER — Other Ambulatory Visit: Payer: Self-pay

## 2022-05-05 DIAGNOSIS — Z79899 Other long term (current) drug therapy: Secondary | ICD-10-CM

## 2022-05-05 DIAGNOSIS — E7800 Pure hypercholesterolemia, unspecified: Secondary | ICD-10-CM

## 2022-05-05 MED ORDER — EZETIMIBE 10 MG PO TABS: 10.0000 mg | ORAL_TABLET | Freq: Every day | ORAL | 3 refills | Status: DC

## 2022-05-09 ENCOUNTER — Encounter: Payer: Self-pay | Admitting: Physical Therapy

## 2022-05-09 ENCOUNTER — Ambulatory Visit: Payer: Medicare Other | Admitting: Physical Therapy

## 2022-05-09 VITALS — BP 130/75

## 2022-05-09 DIAGNOSIS — R278 Other lack of coordination: Secondary | ICD-10-CM

## 2022-05-09 DIAGNOSIS — R2681 Unsteadiness on feet: Secondary | ICD-10-CM

## 2022-05-09 DIAGNOSIS — R41840 Attention and concentration deficit: Secondary | ICD-10-CM | POA: Diagnosis not present

## 2022-05-09 DIAGNOSIS — M6281 Muscle weakness (generalized): Secondary | ICD-10-CM

## 2022-05-09 DIAGNOSIS — R2689 Other abnormalities of gait and mobility: Secondary | ICD-10-CM

## 2022-05-09 DIAGNOSIS — I639 Cerebral infarction, unspecified: Secondary | ICD-10-CM

## 2022-05-09 NOTE — Therapy (Signed)
OUTPATIENT PHYSICAL THERAPY NEURO TREATMENT   Patient Name: Edwin Wall MRN: [id number] DOB:17-Jun-1940, 82 y.o., male Today's Date: 05/09/2022   PCP: [name], MD REFERRING PROVIDER: [name], MD  END OF SESSION:  PT End of Session - 05/09/22 1236     Visit Number 5    Number of Visits 9    Date for PT Re-Evaluation 06/06/22    Authorization Type Medicare A & B    PT Start Time [id number]    PT Stop Time 1315    PT Time Calculation (min) 40 min    Equipment Utilized During Treatment Gait belt    Activity Tolerance Patient tolerated treatment well    Behavior During Therapy WFL for tasks assessed/performed               Past Medical History:  Diagnosis Date   Anxiety    Arthritis    Bilateral leg edema    Cataract    Colon cancer ([place]) dx'd 11/2016   "stage 3"   Gout    Gout    Hemorrhoids    History of kidney stones    "passed it"   Hypertension    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[place])    EF 45-50% by echo 10/22   PAF (paroxysmal atrial fibrillation) ([place])    Pre-diabetes    Past Surgical History:  Procedure Laterality Date   CARDIOVERSION [name] 04/23/2021   Procedure: CARDIOVERSION;  Surgeon: [name], MD;  Location: [facility] ENDOSCOPY;  Service: Cardiovascular;  Laterality: [name];   COLECTOMY  12/17/2016   lap; partial sigmoid colectomy/notes 12/17/2016   COLONOSCOPY W/ BIOPSIES AND POLYPECTOMY  11/11/2016   LAPAROSCOPIC SIGMOID COLECTOMY [name] 12/17/2016   Procedure: LAPAROSCOPIC SIGMOID COLECTOMY;  Surgeon: [name], MD;  Location: [place];  Service: General;  Laterality: [name];   LEFT HEART CATH AND CORONARY ANGIOGRAPHY [name] 03/11/2021   Procedure: LEFT HEART CATH AND CORONARY ANGIOGRAPHY;  Surgeon: [name], MD;  Location: [facility] CV LAB;  Service: Cardiovascular;  Laterality: [name];   NASAL SEPTUM SURGERY     TONSILLECTOMY     Patient Active Problem List   Diagnosis Date Noted   Recurrent falls 03/06/2022    COVID-19 03/05/2022   Physical deconditioning 03/05/2022   Non-traumatic rhabdomyolysis 02/28/2022   Acute on chronic heart failure ([place]) 02/25/2022   Pneumonia due to COVID-19 virus 02/25/2022   Bilateral leg edema 04/18/2021   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[place]) 04/18/2021   AMS (altered mental status) 04/14/2021   Syncope and collapse 03/11/2021   Abnormal nuclear stress test 03/11/2021   Protein-calorie malnutrition, severe 01/18/2021   Orthostatic hypotension    Fall    Weight loss    Secondary hypercoagulable state ([place]) 01/02/2021   LOC (loss of consciousness) ([place]) 12/23/2020   Abnormality of gait 09/10/2020   Leukocytosis    Somnolence    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/2020   Acute right hemiparesis ([place]) 07/19/2020   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   AKI (acute kidney injury) ([place])    Prediabetes    PAF (paroxysmal atrial fibrillation) ([place])    Dysphagia    Hyperlipidemia 07/16/2020   Type II diabetes mellitus ([place]) 07/16/2020   Atrial fibrillation ([place]) 07/16/2020   Acute CVA (cerebrovascular accident) ([place]) 07/15/2020   Cancer of sigmoid colon ([place]) 12/17/2016   Obesity (BMI 30-39.9) 01/15/2011   Hypertension 01/15/2011   History of gout 01/15/2011   ED (erectile dysfunction) 01/15/2011    ONSET DATE: 02/28/2022  REFERRING DIAG: [name]  on feet (R26.81);Muscle weakness (generalized) (M62.81);Difficulty in walking, not elsewhere classified (R26.2)  THERAPY DIAG:  Acute CVA (cerebrovascular accident) ([place])  Other abnormalities of gait and mobility  Unsteadiness on feet  Muscle weakness (generalized)  Other lack of coordination  Rationale for Evaluation and Treatment: Habilitation  SUBJECTIVE:                                                                                                                                                                                             SUBJECTIVE STATEMENT: Pt ambulated into clinic without AD.  Reports doing well; he reports walking outside and doing quite a few things out in the yard over the weekend. He states he has tried a few exercises at home.   Pt accompanied by:  Wife, [name]   PERTINENT HISTORY: CVA, HFrEF, Afib, HLD, seizures, CKD  PAIN:  Are you having pain? No  PRECAUTIONS: Fall  WEIGHT BEARING RESTRICTIONS: No  FALLS: Has patient fallen in last 6 months? Yes. Number of falls at least 2  LIVING ENVIRONMENT: Lives with: lives with their family and lives with their spouse Lives in: House/apartment Stairs: Yes: Internal: full flight steps; can reach both and External: 4 in front w/rails and 2 in back without rails steps; none Has following equipment at home: Quad cane small base, Walker - 2 wheeled, Walker - 4 wheeled, shower chair, bed side commode, Grab bars, and is obtaining a WC  PLOF: Requires assistive device for independence, Needs assistance with homemaking, and Needs assistance with gait  PATIENT GOALS: Pt pointed to feet when asked question   OBJECTIVE:   DIAGNOSTIC FINDINGS: CT of head on 03/05/22  IMPRESSION: 1. No acute intracanial process. Unchanged large region of encephalomalacia along the left frontal lobe, frontal operculum, and portions of the left insular cortex. 2. No acute fracture or traumatic listhesis in the cervical, thoracic, or lumbar spine. 3. 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 4. At least moderate spinal canal stenosis at C6-C7. 5. Diffuse clustered nodularity in the periphery of the bilateral upper and lower lobes, which may be infectious or inflammatory.  COGNITION: Overall cognitive status: Impaired   TODAY'S TREATMENT:     There Act:  Cone pickups semicircle on firm ground 3 x 6 cones (CGA)  Cone pickups semicircle standing on foam 6 x 4 cones (CGA-minA) - reduced awareness of when was at front of  mat; education on increasing awareness of surrounding  1 x 115' 6 cone pickups/set back downs around gym (CGA-SBA)   -  Set 1 : 1.22"   - Set 2: 59.85"    - Set 3: 57.41"  NMR:  Bird dogs with contralateral UE support --> regressed to no UE support on second  set 2 x 10  Alt arm/leg taps standing on foam pad 3 x 10 (CGA-minA)   PATIENT EDUCATION: Education details: Continue HEP, introduced 6-week Chief Executive Officer program from [place] educated: Patient and Spouse Education method: Explanation, Media planner, and Verbal cues Education comprehension: needs further education  HOME EXERCISE PROGRAM: Access Code: [email or web address] URL: [email or web address] Date: 04/11/2022 Prepared by: Malachi Carl  Exercises - Hip Swing  - 1 x daily - 7 x weekly - 3 sets - 10 reps - Semi-Tandem Balance at [place]  - 1 x daily - 7 x weekly - 3 sets - 30 hold  Provided pdf of 6 week beginner walking program - [employer].  GOALS: Goals reviewed with patient? Yes  SHORT TERM GOALS: Target date: 05/02/2022   Pt will perform initial HEP w/S* from wife for improved strength, balance, transfers and gait.  Baseline: not established on eval  Goal status: MET  2.  Patient will improve 6MWT score by 112 feet to demonstrate a clinically meaningful improvement in aerobic capacity and endurance needed for ambulating in the community.   Baseline: 1028 feet with SBA-CGA; 983' w/SBA on 3/1 Goal status: NOT MET  3.  Patient will improve Berg Balance score by 5 points to indicate clinically significant progress towards a decreased risk of falls and improved static stability.   Baseline: 38/56; 45/56 on 3/1 Goal status: MET   LONG TERM GOALS: Target date: 05/30/2022   Pt will be independent with final HEP for improved strength, balance, transfers and gait.  Baseline:  Goal status: INITIAL  2.  Pt will improve gait velocity to at least 3.0 ft/s w/LRAD at S* level  for improved gait efficiency  Baseline: 2.7 ft/s w/SBQC  Goal status: INITIAL  3.  Patient will improve Berg Balance score to 45/56 or greater to indicate a decreased risk of falls and improved static stability.   Baseline: 38/56; 45/56 on 3/1 Goal status: MET  4.  Patient will improve 6MWT score by 160 feet to demonstrate a clinically meaningful improvement in aerobic capacity and endurance needed for ambulating in the community and progress towards age matched norms.   Baseline:1028 feet with SBA-CGA  Goal status: INITIAL  5.  Pt will improve normal TUG to less than or equal to 12 seconds w/LRAD for improved functional mobility and decreased fall risk.  Baseline: 14.6s w/SBQC Goal status: INITIAL  ASSESSMENT:  CLINICAL IMPRESSION: Patient overall presents with good balance given age; however, cognition continues to be primary limitation resulting in decreased safety awareness as indicated by poor awareness of edge of foam pad when working on standing balance. Otherwise demonstrated increased buy in and effort throughout session and tolerance for increased gait speed. Continue POC.    OBJECTIVE IMPAIRMENTS: Abnormal gait, decreased activity tolerance, decreased balance, decreased cognition, decreased coordination, decreased knowledge of condition, decreased knowledge of use of DME, difficulty walking, decreased safety awareness, and improper body mechanics  ACTIVITY LIMITATIONS: carrying, lifting, squatting, stairs, transfers, locomotion level, and caring for others  PARTICIPATION LIMITATIONS: meal prep, cleaning, laundry, interpersonal relationship, driving, shopping, community activity, and yard work  PERSONAL FACTORS: Age, Behavior pattern, Fitness, Past/current experiences, and 1 comorbidity: Hx of CVA  are also affecting patient's functional outcome.   REHAB POTENTIAL: Good  CLINICAL DECISION MAKING:  Stable/uncomplicated  EVALUATION COMPLEXITY: Low  PLAN:  PT FREQUENCY:  1x/week  PT DURATION: 8 weeks  PLANNED INTERVENTIONS: Therapeutic exercises, Therapeutic activity, Neuromuscular re-education, Balance training, Gait training, Patient/Family education, Self Care, Joint mobilization, Stair training, DME instructions, Manual therapy, and Re-evaluation  PLAN FOR NEXT SESSION: Update HEP, tandem balance, dynamic balance tasks with stepping strategy, walking for endurance/sciFit, LE strengthening, treadmill training  Esperanza Heir, PT, DPT 05/09/2022, 2:59 PM

## 2022-05-16 ENCOUNTER — Ambulatory Visit: Payer: Medicare Other | Admitting: Physical Therapy

## 2022-05-16 ENCOUNTER — Ambulatory Visit: Payer: Medicare Other | Admitting: Speech Pathology

## 2022-05-16 ENCOUNTER — Encounter: Payer: Self-pay | Admitting: Speech Pathology

## 2022-05-16 DIAGNOSIS — R41840 Attention and concentration deficit: Secondary | ICD-10-CM

## 2022-05-16 DIAGNOSIS — R41841 Cognitive communication deficit: Secondary | ICD-10-CM

## 2022-05-16 DIAGNOSIS — R278 Other lack of coordination: Secondary | ICD-10-CM

## 2022-05-16 DIAGNOSIS — R4701 Aphasia: Secondary | ICD-10-CM

## 2022-05-16 DIAGNOSIS — R2681 Unsteadiness on feet: Secondary | ICD-10-CM

## 2022-05-16 DIAGNOSIS — R2689 Other abnormalities of gait and mobility: Secondary | ICD-10-CM

## 2022-05-16 NOTE — Therapy (Signed)
OUTPATIENT SPEECH LANGUAGE PATHOLOGY APHASIA EVALUATION   Patient Name: Edwin Wall MRN: [id number] DOB:1940-12-01, 82 y.o., male Today's Date: 05/16/2022  PCP: [name], MD REFERRING PROVIDER: [name], MD  END OF SESSION:  End of Session - 05/16/22 1332     Visit Number 1    Number of Visits 13    Date for SLP Re-Evaluation 06/27/22    Authorization Type Medicare    Progress Note Due on Visit 10    SLP Start Time 1246    SLP Stop Time  1333    SLP Time Calculation (min) 47 min    Activity Tolerance Patient tolerated treatment well             Past Medical History:  Diagnosis Date   Anxiety    Arthritis    Bilateral leg edema    Cataract    Colon cancer ([place]) dx'd 11/2016   "stage 3"   Gout    Gout    Hemorrhoids    History of kidney stones    "passed it"   Hypertension    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[place])    EF 45-50% by echo 10/22   PAF (paroxysmal atrial fibrillation) ([place])    Pre-diabetes    Past Surgical History:  Procedure Laterality Date   CARDIOVERSION [name] 04/23/2021   Procedure: CARDIOVERSION;  Surgeon: [name], MD;  Location: [facility];  Service: Cardiovascular;  Laterality: [name];   COLECTOMY  12/17/2016   lap; partial sigmoid colectomy/notes 12/17/2016   COLONOSCOPY W/ BIOPSIES AND POLYPECTOMY  11/11/2016   LAPAROSCOPIC SIGMOID COLECTOMY [name] 12/17/2016   Procedure: LAPAROSCOPIC SIGMOID COLECTOMY;  Surgeon: [name], MD;  Location: [employer] [facility];  Service: General;  Laterality: [name];   LEFT HEART CATH AND CORONARY ANGIOGRAPHY [name] 03/11/2021   Procedure: LEFT HEART CATH AND CORONARY ANGIOGRAPHY;  Surgeon: [name], MD;  Location: [place] [facility] CV LAB;  Service: Cardiovascular;  Laterality: [name];   NASAL SEPTUM SURGERY     TONSILLECTOMY     Patient Active Problem List   Diagnosis Date Noted   Recurrent falls 03/06/2022   COVID-19 03/05/2022   Physical deconditioning 03/05/2022   Non-traumatic  rhabdomyolysis 02/28/2022   Acute on chronic heart failure ([place]) 02/25/2022   Pneumonia due to COVID-19 virus 02/25/2022   Bilateral leg edema 04/18/2021   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[place]) 04/18/2021   AMS (altered mental status) 04/14/2021   Syncope and collapse 03/11/2021   Abnormal nuclear stress test 03/11/2021   Protein-calorie malnutrition, severe 01/18/2021   Orthostatic hypotension    Fall    Weight loss    Secondary hypercoagulable state ([place]) 01/02/2021   LOC (loss of consciousness) ([place]) 12/23/2020   Abnormality of gait 09/10/2020   Leukocytosis    Somnolence    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/2020   Acute right hemiparesis ([place]) 07/19/2020   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   AKI (acute kidney injury) ([place])    Prediabetes    PAF (paroxysmal atrial fibrillation) ([place])    Dysphagia    Hyperlipidemia 07/16/2020   Type II diabetes mellitus ([place]) 07/16/2020   Atrial fibrillation ([place]) 07/16/2020   Acute CVA (cerebrovascular accident) ([place]) 07/15/2020   Cancer of sigmoid colon ([place]) 12/17/2016   Obesity (BMI 30-39.9) 01/15/2011   Hypertension 01/15/2011   History of gout 01/15/2011   ED (erectile dysfunction) 01/15/2011    ONSET DATE: referral 05/05/2022   REFERRING DIAG: I63.9 (ICD-10-CM) - Acute CVA (cerebrovascular accident) ([place])   THERAPY DIAG:  Cognitive  communication deficit  Aphasia  Rationale for Evaluation and Treatment: Rehabilitation  SUBJECTIVE:   SUBJECTIVE STATEMENT: Pt's spouse reports she will have "no idea what he's talking about. 50% of the time I get it right" Pt accompanied by:  wife, [name]  PERTINENT HISTORY: CVA, HFrEF, Afib, HLD, seizures, CKD. ST course in 2022 following CVA targeting aphasia.   PAIN:  Are you having pain? No  FALLS: Has patient fallen in last 6 months?  Yes, See PT evaluation for details  LIVING ENVIRONMENT: Lives with: lives with their family Lives in: House/apartment  PLOF:  Level of assistance:  Needed assistance with ADLs, Needed assistance with IADLS Employment: Retired  PATIENT GOALS: "I don't know"   OBJECTIVE:   COGNITION: Overall cognitive status: Impaired Areas of impairment:  Awareness: Impaired: Intellectual, Emergent, and Anticipatory Functional deficits: poor safety awareness likely contributing to falls   AUDITORY COMPREHENSION: Overall auditory comprehension: Appears intact YES/NO questions: Appears intact Following directions: Appears intact Conversation: Moderately Complex Interfering components: hearing Effective technique: repetition/stressing words  READING COMPREHENSION: Intact  EXPRESSION: verbal  VERBAL EXPRESSION: Level of generative/spontaneous verbalization: conversation Automatic speech: social response: intact and month of year: intact  Repetition: Appears intact Naming: Confrontation: 54% Pragmatics: Appears intact Comments: mixed paraphasias with intermittent awareness  Effective technique: semantic cues, sentence completion, and phonemic cues trialed, none appeared effective  Non-verbal means of communication: [name]  WRITTEN EXPRESSION: Written expression: Not tested  MOTOR SPEECH: Overall motor speech: Appears intact  ORAL MOTOR EXAMINATION: Overall status: Did not assess  STANDARDIZED ASSESSMENTS: [place] NAMING TEST: 34/60 (55.1 or greater WNL) FO4 intermittently facilitative for accurate naming of erroneous trials, however pt often selecting semantic or phonemic foils  SLP cued description strategy, which was effective to describe object so that listener could determine target x4  TODAY'S TREATMENT:   05/16/2022: training for anomia compensations for pt and supportive communication strategies for care partner. Recommendations made for facilitating phone calls.   Education provided re: prognosis for improvement, need for carryover and home practice to see improvements. Pt tells SLP "I don't know if it's worth the effort." SLP  proposes to ensure therapy is personally relevant to aid in buy in.   PATIENT EDUCATION: Education details: see above Person educated: Patient and Spouse Education method: Explanation, Demonstration, and Handouts Education comprehension: verbalized understanding, returned demonstration, verbal cues required, and needs further education   GOALS: Goals reviewed with patient? Yes  SHORT TERM GOALS: Target date: 06/13/2022  Pt will demonstrate anomia strategy sufficient to relay intended meaning in 75% of trials with occasional mod-A during structured practice over 2 sessions Baseline: Goal status: INITIAL  2.  Pt will use compensations PRN (e.g. script) to role play telephone call with occasional min-A Baseline:  Goal status: INITIAL  3.  Pt and spouse will initiate development of low-tech, personalize communication support to aid in improved communication with A from SLP Baseline:  Goal status: INITIAL   LONG TERM GOALS: Target date: 06/27/2022  Spouse will report 25% subjective improvement in ability to determine pt's intended message over 1 week period Baseline:  Goal status: INITIAL  2.  Pt will use compensations to participate in x1 phone call with daughter  Baseline:  Goal status: INITIAL  3.  Pt and spouse will add x3 pages to low tech, personalize communication support Baseline:  Goal status: INITIAL  ASSESSMENT:  CLINICAL IMPRESSION: Patient is a 82 y.o. M who was seen today for cognitive linguistic evaluation. 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 Pt and spouse report usual communication breakdowns d/t aphasia which result in frustration and usual communication breakdowns. Spouse estimates 50% success rate in determining pt's intended message. Currently using guided questions to A in determining intended message. Pt with recent falls which are reportedly attributed to poor safety awareness.  SLP unable to question at eval d/t time constraints, will address in subsequent visits and add goals PRN. Pt would benefit from skilled ST to address aforementioned deficits to enhance communication efficacy and facilitate improved swallow function/safety . Pt unsure of whether he'd like to participate in therapy, asks for only one scheduled visit at this time, will add more based on motivation to participate at that time.   OBJECTIVE IMPAIRMENTS: include awareness and aphasia. These impairments are limiting patient from effectively communicating at home and in community. Factors affecting potential to achieve goals and functional outcome are cooperation/participation level. Patient will benefit from skilled SLP services to address above impairments and improve overall function.  REHAB POTENTIAL: Fair (poor buy-in)  PLAN:  SLP FREQUENCY: 1-2x/week  SLP DURATION: 6 weeks  PLANNED INTERVENTIONS: Language facilitation, Cueing hierachy, Cognitive reorganization, Internal/external aids, Functional tasks, Multimodal communication approach, SLP instruction and feedback, Compensatory strategies, and Patient/family education  Su Monks, CCC-SLP 05/16/2022, 2:00 PM

## 2022-05-16 NOTE — Patient Instructions (Signed)
Phone calls:  Write down a few things before the phone call that you can share.   Plan ahead, schedule it advance so that you can prepare.   Tell conversational partner you have to stay on topic and responding to complicated questions can be hard.    Describing words  What group does it belong to?  What do I use it for?  Where can I find it?  What does it LOOK like? Shape, color, parts, material   What other words go with it?  What is the 1st sound of the word?   Many Ways to Communicate  Describe it Write it Draw it Gesture it Use related words

## 2022-05-16 NOTE — Therapy (Signed)
OUTPATIENT PHYSICAL THERAPY NEURO TREATMENT- DISCHARGE SUMMARY   Patient Name: Edwin Wall MRN: [id number] DOB:1940-06-25, 82 y.o., male Today's Date: 05/16/2022   PCP: [name], MD REFERRING PROVIDER: [name], MD   PHYSICAL THERAPY DISCHARGE SUMMARY  Visits from Start of Care: 6  Current functional level related to goals / functional outcomes: Mod I-S* w/mobility with use of cane or no AD    Remaining deficits: Decreased safety awareness, impaired memory and difficulty word finding    Education / Equipment: HEP, strong encouragement to join [employer] for continued exercise and social engagement    Patient agrees to discharge. Patient goals were  discontinued . Patient is being discharged due to the patient's request.  END OF SESSION:  PT End of Session - 05/16/22 1234     Visit Number 6    Number of Visits 9    Date for PT Re-Evaluation 06/06/22    Authorization Type Medicare A & B    PT Start Time 1233    PT Stop Time 1245   DC   PT Time Calculation (min) 12 min    Equipment Utilized During Treatment --    Activity Tolerance Patient tolerated treatment well    Behavior During Therapy WFL for tasks assessed/performed                Past Medical History:  Diagnosis Date   Anxiety    Arthritis    Bilateral leg edema    Cataract    Colon cancer ([place]) dx'd 11/2016   "stage 3"   Gout    Gout    Hemorrhoids    History of kidney stones    "passed it"   Hypertension    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[place])    EF 45-50% by echo 10/22   PAF (paroxysmal atrial fibrillation) ([place])    Pre-diabetes    Past Surgical History:  Procedure Laterality Date   CARDIOVERSION [name] 04/23/2021   Procedure: CARDIOVERSION;  Surgeon: [name], MD;  Location: [facility] ENDOSCOPY;  Service: Cardiovascular;  Laterality: [name];   COLECTOMY  12/17/2016   lap; partial sigmoid colectomy/notes 12/17/2016   COLONOSCOPY W/ BIOPSIES AND POLYPECTOMY   11/11/2016   LAPAROSCOPIC SIGMOID COLECTOMY [name] 12/17/2016   Procedure: LAPAROSCOPIC SIGMOID COLECTOMY;  Surgeon: [name], MD;  Location: [place];  Service: General;  Laterality: [name];   LEFT HEART CATH AND CORONARY ANGIOGRAPHY [name] 03/11/2021   Procedure: LEFT HEART CATH AND CORONARY ANGIOGRAPHY;  Surgeon: [name], MD;  Location: [place] [facility];  Service: Cardiovascular;  Laterality: [name];   NASAL SEPTUM SURGERY     TONSILLECTOMY     Patient Active Problem List   Diagnosis Date Noted   Recurrent falls 03/06/2022   COVID-19 03/05/2022   Physical deconditioning 03/05/2022   Non-traumatic rhabdomyolysis 02/28/2022   Acute on chronic heart failure ([place]) 02/25/2022   Pneumonia due to COVID-19 virus 02/25/2022   Bilateral leg edema 04/18/2021   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[place]) 04/18/2021   AMS (altered mental status) 04/14/2021   Syncope and collapse 03/11/2021   Abnormal nuclear stress test 03/11/2021   Protein-calorie malnutrition, severe 01/18/2021   Orthostatic hypotension    Fall    Weight loss    Secondary hypercoagulable state ([name]) 01/02/2021   LOC (loss of consciousness) ([place]) 12/23/2020   Abnormality of gait 09/10/2020   Leukocytosis    Somnolence    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/2020   Acute right hemiparesis ([place]) 07/19/2020   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   AKI (acute kidney injury) ([place])    Prediabetes    PAF (paroxysmal atrial fibrillation) ([place])    Dysphagia    Hyperlipidemia 07/16/2020   Type II diabetes mellitus ([place]) 07/16/2020   Atrial fibrillation ([place]) 07/16/2020   Acute CVA (cerebrovascular accident) ([place]) 07/15/2020   Cancer of sigmoid colon ([place]) 12/17/2016   Obesity (BMI 30-39.9) 01/15/2011   Hypertension 01/15/2011   History of gout 01/15/2011   ED (erectile dysfunction) 01/15/2011    ONSET DATE: 02/28/2022  REFERRING DIAG: Unsteadiness on feet (R26.81);Muscle weakness (generalized) (M62.81);Difficulty in walking, not  elsewhere classified (R26.2)  THERAPY DIAG:  Other abnormalities of gait and mobility  Unsteadiness on feet  Other lack of coordination  Attention and concentration deficit  Rationale for Evaluation and Treatment: Habilitation  SUBJECTIVE:                                                                                                                                                                                             SUBJECTIVE STATEMENT: Pt ambulated into clinic with cane. States he feels as though "he may need it" but unable to state why. No falls since last visit. States he is doing "other stuff" when asked about his HEP.   Pt accompanied by:  Wife, [name]   PERTINENT HISTORY: CVA, HFrEF, Afib, HLD, seizures, CKD  PAIN:  Are you having pain? No  PRECAUTIONS: Fall  WEIGHT BEARING RESTRICTIONS: No  FALLS: Has patient fallen in last 6 months? Yes. Number of falls at least 2  LIVING ENVIRONMENT: Lives with: lives with their family and lives with their spouse Lives in: House/apartment Stairs: Yes: Internal: full flight steps; can reach both and External: 4 in front w/rails and 2 in back without rails steps; none Has following equipment at home: Quad cane small base, Walker - 2 wheeled, Walker - 4 wheeled, shower chair, bed side commode, Grab bars, and is obtaining a WC  PLOF: Requires assistive device for independence, Needs assistance with homemaking, and Needs assistance with gait  PATIENT GOALS: Pt pointed to feet when asked question   OBJECTIVE:   DIAGNOSTIC FINDINGS: CT of head on 03/05/22  IMPRESSION: 1. No acute intracanial process. Unchanged large region of encephalomalacia along the left frontal lobe, frontal operculum, and portions of the left insular cortex. 2. No acute fracture or traumatic listhesis in the cervical, thoracic, or lumbar spine. 3. 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 4. At least moderate spinal canal stenosis at C6-C7. 5. Diffuse clustered nodularity in  the periphery of the bilateral upper and lower lobes, which may be infectious or inflammatory.  COGNITION: Overall cognitive status: Impaired   TODAY'S TREATMENT:    Ther Act  Pt reports he does not feel like he needs PT. He worked in the yard all day and feels as though he is fine. Therapist in agreement with patient but did encourage pt to rejoin [facility] and stay active, pt and wife verbalized understanding. Educated pt on how to obtain new referral in future if mobility needs change.    PATIENT EDUCATION: Education details: See above Person educated: Patient and Spouse Education method: Explanation, Demonstration, and Verbal cues Education comprehension: needs further education  HOME EXERCISE PROGRAM: Access Code: [email or web address] URL: [email or web address] Date: 04/11/2022 Prepared by: Malachi Carl  Exercises - Hip Swing  - 1 x daily - 7 x weekly - 3 sets - 10 reps - Semi-Tandem Balance at [place]  - 1 x daily - 7 x weekly - 3 sets - 30 hold  Provided pdf of 6 week beginner walking program - [employer].  GOALS: Goals reviewed with patient? Yes  SHORT TERM GOALS: Target date: 05/02/2022   Pt will perform initial HEP w/S* from wife for improved strength, balance, transfers and gait.  Baseline: not established on eval  Goal status: MET  2.  Patient will improve 6MWT score by 112 feet to demonstrate a clinically meaningful improvement in aerobic capacity and endurance needed for ambulating in the community.   Baseline: 1028 feet with SBA-CGA; 983' w/SBA on 3/1 Goal status: NOT MET  3.  Patient will improve Berg Balance score by 5 points to indicate clinically significant progress towards a decreased risk of falls and improved static stability.   Baseline: 38/56; 45/56 on 3/1 Goal status: MET   LONG TERM  GOALS: Target date: 05/30/2022- ALL DISCONTINUED   Pt will be independent with final HEP for improved strength, balance, transfers and gait.  Baseline:  Goal status: INITIAL  2.  Pt will improve gait velocity to at least 3.0 ft/s w/LRAD at S* level for improved gait efficiency  Baseline: 2.7 ft/s w/SBQC  Goal status: INITIAL  3.  Patient will improve Berg Balance score to 45/56 or greater to indicate a decreased risk of falls and improved static stability.   Baseline: 38/56; 45/56 on 3/1 Goal status: MET  4.  Patient will improve 6MWT score by 160 feet to demonstrate a clinically meaningful improvement in aerobic capacity and endurance needed for ambulating in the community and progress towards age matched norms.   Baseline:1028 feet with SBA-CGA  Goal status: INITIAL  5.  Pt will improve normal TUG to less than or equal to 12 seconds w/LRAD for improved functional mobility and decreased fall risk.  Baseline: 14.6s w/SBQC Goal status: INITIAL  ASSESSMENT:  CLINICAL IMPRESSION: Emphasis of skilled PT session on DC from PT. Pt reports not needing therapy at this time, as he is able to do everything he wants. All goals discontinued, but pt had met his balance goal several weeks ago. Educated pt on how to obtain new referral in future if mobility needs change, pt and wife verbalized understanding.    OBJECTIVE IMPAIRMENTS: Abnormal gait, decreased activity tolerance, decreased balance, decreased cognition, decreased coordination, decreased knowledge of condition, decreased knowledge of use of DME, difficulty walking, decreased safety awareness, and improper body mechanics  ACTIVITY LIMITATIONS: carrying, lifting, squatting, stairs, transfers, locomotion level, and caring for others  PARTICIPATION LIMITATIONS: meal prep, cleaning, laundry, interpersonal  relationship, driving, shopping, community activity, and yard work  PERSONAL FACTORS: Age, Behavior pattern, Fitness, Past/current  experiences, and 1 comorbidity: Hx of CVA  are also affecting patient's functional outcome.   REHAB POTENTIAL: Good  CLINICAL DECISION MAKING: Stable/uncomplicated  EVALUATION COMPLEXITY: Low  PLAN:  PT FREQUENCY: 1x/week  PT DURATION: 8 weeks  PLANNED INTERVENTIONS: Therapeutic exercises, Therapeutic activity, Neuromuscular re-education, Balance training, Gait training, Patient/Family education, Self Care, Joint mobilization, Stair training, DME instructions, Manual therapy, and Re-evaluation  Sangeeta Youse E Annakate Soulier, PT, DPT 05/16/2022, 12:50 PM

## 2022-05-23 ENCOUNTER — Ambulatory Visit: Payer: Medicare Other | Admitting: Physical Therapy

## 2022-05-23 ENCOUNTER — Ambulatory Visit: Payer: Medicare Other | Admitting: Speech Pathology

## 2022-05-30 ENCOUNTER — Ambulatory Visit: Payer: Medicare Other | Admitting: Physical Therapy

## 2022-06-06 ENCOUNTER — Ambulatory Visit: Payer: Medicare Other | Admitting: Neurology

## 2022-07-03 ENCOUNTER — Other Ambulatory Visit (HOSPITAL_BASED_OUTPATIENT_CLINIC_OR_DEPARTMENT_OTHER): Payer: Self-pay | Admitting: Nurse Practitioner

## 2022-07-03 DIAGNOSIS — E7800 Pure hypercholesterolemia, unspecified: Secondary | ICD-10-CM

## 2022-07-03 DIAGNOSIS — Z79899 Other long term (current) drug therapy: Secondary | ICD-10-CM

## 2022-07-04 ENCOUNTER — Ambulatory Visit: Payer: Medicare Other | Admitting: Neurology

## 2022-07-21 ENCOUNTER — Ambulatory Visit (INDEPENDENT_AMBULATORY_CARE_PROVIDER_SITE_OTHER): Payer: Medicare Other | Admitting: Neurology

## 2022-07-21 ENCOUNTER — Encounter: Payer: Self-pay | Admitting: Neurology

## 2022-07-21 VITALS — BP 129/69 | HR 59 | Ht 68.5 in | Wt 202.6 lb

## 2022-07-21 DIAGNOSIS — Z8673 Personal history of transient ischemic attack (TIA), and cerebral infarction without residual deficits: Secondary | ICD-10-CM

## 2022-07-21 DIAGNOSIS — G40209 Localization-related (focal) (partial) symptomatic epilepsy and epileptic syndromes with complex partial seizures, not intractable, without status epilepticus: Secondary | ICD-10-CM | POA: Diagnosis not present

## 2022-07-21 MED ORDER — LEVETIRACETAM 750 MG PO TABS: 750.0000 mg | ORAL_TABLET | Freq: Two times a day (BID) | ORAL | 3 refills | Status: DC

## 2022-07-21 NOTE — Patient Instructions (Signed)
Always a pleasure to see you. Continue Levetiracetam (Keppra) 750mg : take 1 tablet twice a day. Continue Eliquis and control of blood pressure, cholesterol, sugar levels. Continue to monitor mood changes. Follow-up in 6 months, call for any changes.   Seizure Precautions: 1. If medication has been prescribed for you to prevent seizures, take it exactly as directed.  Do not stop taking the medicine without talking to your doctor first, even if you have not had a seizure in a long time.   2. Avoid activities in which a seizure would cause danger to yourself or to others.  Don't operate dangerous machinery, swim alone, or climb in high or dangerous places, such as on ladders, roofs, or girders.  Do not drive unless your doctor says you may.  3. If you have any warning that you may have a seizure, lay down in a safe place where you can't hurt yourself.    4.  No driving for 6 months from last seizure, as per [facility].   Please refer to the following link on the Epilepsy Foundation of America's website for more information: http://www.epilepsyfoundation.org/answerplace/Social/driving/drivingu.cfm   5.  Maintain good sleep hygiene.  6.  Contact your doctor if you have any problems that may be related to the medicine you are taking.  7.  Call 911 and bring the patient back to the ED if:        A.  The seizure lasts longer than 5 minutes.       B.  The patient doesn't awaken shortly after the seizure  C.  The patient has new problems such as difficulty seeing, speaking or moving  D.  The patient was injured during the seizure  E.  The patient has a temperature over 102 F (39C)  F.  The patient vomited and now is having trouble breathing

## 2022-07-21 NOTE — Progress Notes (Signed)
NEUROLOGY FOLLOW UP OFFICE NOTE  JIARUI SEAT [id number] May 01, 1940  HISTORY OF PRESENT ILLNESS: I had the pleasure of seeing Edwin Wall in follow-up in the neurology clinic on 07/21/2022.  The patient was last seen 7 months ago for seizures and history of stroke. He is again accompanied by his wife who helps supplement the history today.  Records and images were personally reviewed where available. He had 2 episodes of vocalization with arm stiffening, last episode was in 07/2021. He has been on Levetiracetam 750mg  BID with no side effects. They deny any staring/unresponsive episodes, gaps in time, olfactory/gustatory hallucinations, focal numbness/tingling/weakness, myoclonic jerks. No headaches, dizziness, vision changes, no falls. He is on Eliquis for secondary stroke prevention. Sleep is good. Sometimes his wife notes he is a little bit moody, she can see the change in his eyes, "he's just not himself." They tend to occur if he's worked too hard or something is bothering him. It comes and goes, it is not frequent. He manages his own medications and has been back to driving without issues. He still has word-finding difficulties from the stroke. He stays active.    History on Initial Assessment 08/13/2021: This is an 82 year old left-handed man with a history of hypertension, hyperlipidemia, DM, CHF, sigmoid colon CA s/p partial colectomy, left MCA stroke in 07/2020, presenting for evaluation of possible seizures. His wife is present to provide additional information. On 11/24/20, he was asleep when he woke up suddenly and both arms went straight up, he made a horrible yelling noise, eyes were closed, he was almost lifting himself off the bed, chest was lifting up,  and she could not wake him up. There was saliva coming out of his mouth. She called 911 and they could not get him awake. He was brought the the ER with note of low potassium, otherwise unremarkable bloodwork/imaging. He was in  the ER in 12/2020 and 01/2021 for syncope, the first episode occurred as he was walking to the garage, he fell forward and hit his head on the grass. He passed out again when family brought him into the house. He was given IV fluids and K replacement in the ER. In 01/2021, he had gone to the bathroom and his wife heard him fall, noticed he was more confused that usual. He was noted to have markedly positive orthostatic vitals on admission. He was admitted in 04/2021 after his wife hard difficulty waking him up that morning, then he appeared confused. I personally reviewed brain MRI without contrast which did not show any acute changes. There was a chronic left MCA infarct involving the left frontal lobe and insula with associated chronic hemosiderin deposition, moderate diffuse atrophy. EEG showed mild diffuse slowing. He was back in the ER on 07/27/21 when he again had an episode where he raised his arms in the air and began screaming uncontrollably after he came out of the bathroom. He fell backwards on to the bed, eyes were closed, saliva coming out his mouth, and his body was jumping up and down. This lasted 5 minutes, then he was very sleepy and confused. He returned to baseline in the ER and was amnestic of the episode. He was discharged home on Levetiracetam 750mg  BID. He denies any side effects, his wife notes he is a little weaker since hospitalization. He had previously lost up to 100 lbs and was gaining some weight back but lost a bit more recently. He sleeps a lot during the day, however this  is not new since his stroke. He does not snore much. His wife feels he gets "too much sleep," he sleeps 10 hours at night and takes naps. His wife denies any staring spells. He denies any olfactory/gustatory hallucinations, deja vu, rising epigastric sensation, focal numbness/tingling/weakness, myoclonic jerks. He denies any headaches, dizziness, vision changes. He may have some difficulty swallowing, he needs to cut  the Levetiracetam tablet in half and take it with applesauce. He has pain in his legs that improve once he gets up. He denies any bowel/bladder dysfunction, anosmia, or tremors. Last fall was 2 weeks ago. He and his wife note memory is pretty good, but he notes that he lost his speech with word-finding difficulties after the stroke ("95% better with initial right-sided weakness). He continues to manage his own medications, his wife checks behind him. His wife has managed finances since the stroke. He denies misplacing things. He stopped driving after the stroke. His wife notes an incident the other day when he ordered pizza and went to get tip money, the delivery person declined and later on he told his wife he did not want to give the garbage man any many. He lives with his wife and son, he states they have been married 43 years, his wife reminds him it is 57 years. No alcohol use. No family history of dementia or seizures. He had a normal birth and early development, no history of febrile seizures, CNS infections, significant head injuries.   Diagnostic Data: 04/2021:MRI without contrast did not show any acute changes. There was a chronic left MCA infarct involving the left frontal lobe and insula with associated chronic hemosiderin deposition, moderate diffuse atrophy. EEG showed mild diffuse slowing.   PAST MEDICAL HISTORY: Past Medical History:  Diagnosis Date   Anxiety    Arthritis    Bilateral leg edema    Cataract    Colon cancer ([facility]) dx'd 11/2016   "stage 3"   Gout    Gout    Hemorrhoids    History of kidney stones    "passed it"   Hypertension    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[facility])    EF 45-50% by echo 10/22   PAF (paroxysmal atrial fibrillation) ([facility])    Pre-diabetes     MEDICATIONS: Current Outpatient Medications on File Prior to Visit  Medication Sig Dispense Refill   apixaban (ELIQUIS) 2.5 MG TABS tablet Take 1 tablet (2.5 mg total) by mouth 2 (two) times daily. 180  tablet 3   colestipol (COLESTID) 1 g tablet Take 1 g by mouth daily.     diclofenac Sodium (VOLTAREN) 1 % GEL Apply 2 g topically 4 (four) times daily as needed (aches).     ezetimibe (ZETIA) 10 MG tablet Take 1 tablet (10 mg total) by mouth daily. 90 tablet 3   furosemide (LASIX) 20 MG tablet TAKE 1 TABLET(20 MG) BY MOUTH DAILY 90 tablet 3   levETIRAcetam (KEPPRA) 750 MG tablet Take 1 tablet (750 mg total) by mouth 2 (two) times daily. 180 tablet 3   liver oil-zinc oxide (DESITIN) 40 % ointment Apply 1 application  topically as needed for irritation.     neomycin-bacitracin-polymyxin (NEOSPORIN) ointment Apply 1 application  topically as needed for wound care.     Phenylephrine HCl (NEO-SYNEPHRINE NA) Place 1 spray into the nose daily as needed (congestion).     potassium chloride (KLOR-CON M) 10 MEQ tablet Take 10 mEq by mouth daily.     rosuvastatin (CRESTOR) 40 MG tablet Take  1 tablet (40 mg total) by mouth daily. 30 tablet 0   No current facility-administered medications on file prior to visit.    ALLERGIES: No Known Allergies  FAMILY HISTORY: Family History  Problem Relation Age of Onset   Cancer Mother        LUNG   COPD Father     SOCIAL HISTORY: Social History   Socioeconomic History   Marital status: Married    Spouse name: Not on file   Number of children: Not on file   Years of education: Not on file   Highest education level: Not on file  Occupational History   Not on file  Tobacco Use   Smoking status: Never   Smokeless tobacco: Never   Tobacco comments:    Never smoke 05/21/21  Vaping Use   Vaping Use: Never used  Substance and Sexual Activity   Alcohol use: No   Drug use: No   Sexual activity: Never  Other Topics Concern   Not on file  Social History Narrative   Right and left handed   Lives with son one story   Caffeine 2 daily   V[employer]an of [employer]    Social Determinants of Health   Financial Resource Strain: Not on file  Food Insecurity: No  Food Insecurity (03/06/2022)   Hunger Vital Sign    Worried About Running Out of Food in the Last Year: Never true    Ran Out of Food in the Last Year: Never true  Transportation Needs: No Transportation Needs (03/06/2022)   PRAPARE - Administrator, Civil Service (Medical): No    Lack of Transportation (Non-Medical): No  Physical Activity: Not on file  Stress: Not on file  Social Connections: Not on file  Intimate Partner Violence: Not At Risk (03/06/2022)   Humiliation, Afraid, Rape, and Kick questionnaire    Fear of Current or Ex-Partner: No    Emotionally Abused: No    Physically Abused: No    Sexually Abused: No     PHYSICAL EXAM: Vitals:   07/21/22 0931  BP: 129/69  Pulse: (!) 59  SpO2: 98%   General: No acute distress Head:  Normocephalic/atraumatic Skin/Extremities: No rash, no edema Neurological Exam: alert and oriented to person, place, and time. No aphasia or dysarthria. He is able to read, repeat, name today. Able to spell WORLD but difficulty spelling backward [facility]"). Fund of knowledge is appropriate.  Attention and concentration are normal.   Cranial nerves: Pupils equal, round. Extraocular movements intact with no nystagmus. Visual fields full.  No facial asymmetry.  Motor: Bulk and tone normal, muscle strength 5/5 throughout with no pronator drift.   Finger to nose testing intact.  Gait narrow-based and steady, no ataxia.  IMPRESSION: This is a pleasant 82 yo LH man with a history of hypertension, hyperlipidemia, DM, CHF, sigmoid colon CA s/p partial colectomy, left MCA stroke in 07/2020 secondary to afib, with 2 episodes of vocalizing with tonic activity of arms. MRI without contrast did not show any acute changes. There was a chronic left MCA infarct involving the left frontal lobe and insula with associated chronic hemosiderin deposition, moderate diffuse atrophy. EEG showed mild diffuse slowing. No seizures since 07/2021 on Levetiracetam 750mg  BID. His wife  notices occasional mood changes, continue to monitor for now. He is aware of [place] driving laws to stop driving until 6 months seizure-free. Continue Eliquis and control of vascular risk factors. Follow-up in 6 months, call for any changes.  Thank you for allowing me to participate in his care.  Please do not hesitate to call for any questions or concerns.    Patrcia Dolly, M.D.   CC: Dr. Cyndia Bent

## 2022-08-25 ENCOUNTER — Other Ambulatory Visit: Payer: Self-pay | Admitting: Neurology

## 2022-10-23 ENCOUNTER — Ambulatory Visit: Payer: Medicare Other | Attending: Cardiology | Admitting: Cardiology

## 2022-10-23 ENCOUNTER — Encounter: Payer: Self-pay | Admitting: Cardiology

## 2023-01-09 ENCOUNTER — Ambulatory Visit: Payer: Medicare Other | Admitting: Neurology

## 2023-01-21 ENCOUNTER — Ambulatory Visit: Payer: Medicare Other | Admitting: Neurology

## 2023-02-23 ENCOUNTER — Other Ambulatory Visit: Payer: Self-pay | Admitting: Neurology

## 2023-02-27 ENCOUNTER — Encounter: Payer: Self-pay | Admitting: Cardiology

## 2023-03-13 ENCOUNTER — Ambulatory Visit: Payer: Medicare Other | Admitting: Cardiology

## 2023-04-01 ENCOUNTER — Other Ambulatory Visit: Payer: Self-pay | Admitting: Neurology

## 2023-04-01 ENCOUNTER — Ambulatory Visit: Payer: Medicare Other | Admitting: Cardiology

## 2023-04-01 ENCOUNTER — Telehealth: Payer: Self-pay | Admitting: Neurology

## 2023-04-01 NOTE — Telephone Encounter (Signed)
Pt. Wife called [facility] ([place] pharmacy is waiting PA for Rx (levETIRAcetam (KEPPRA) 750 MG tablet)

## 2023-05-28 ENCOUNTER — Telehealth: Payer: Self-pay | Admitting: Cardiology

## 2023-05-28 NOTE — Telephone Encounter (Signed)
 05/28/23 called both numbers on file to r/s appt on 04/02 due to change in Dr. Norris Cross schedule. Both phone number's voicemails are full - LCN

## 2023-06-03 ENCOUNTER — Other Ambulatory Visit: Payer: Self-pay | Admitting: Cardiology

## 2023-06-03 ENCOUNTER — Ambulatory Visit: Payer: Medicare Other | Admitting: Cardiology

## 2023-06-03 DIAGNOSIS — E7800 Pure hypercholesterolemia, unspecified: Secondary | ICD-10-CM

## 2023-06-03 DIAGNOSIS — Z79899 Other long term (current) drug therapy: Secondary | ICD-10-CM

## 2023-07-20 ENCOUNTER — Ambulatory Visit: Attending: Cardiology | Admitting: Cardiology

## 2023-07-20 ENCOUNTER — Encounter: Payer: Self-pay | Admitting: Cardiology

## 2023-07-20 VITALS — BP 134/82 | HR 63 | Ht 68.0 in | Wt 220.4 lb

## 2023-07-20 DIAGNOSIS — I4819 Other persistent atrial fibrillation: Secondary | ICD-10-CM | POA: Diagnosis present

## 2023-07-20 DIAGNOSIS — I639 Cerebral infarction, unspecified: Secondary | ICD-10-CM | POA: Insufficient documentation

## 2023-07-20 DIAGNOSIS — Z79899 Other long term (current) drug therapy: Secondary | ICD-10-CM | POA: Insufficient documentation

## 2023-07-20 DIAGNOSIS — I428 Other cardiomyopathies: Secondary | ICD-10-CM | POA: Diagnosis present

## 2023-07-20 DIAGNOSIS — I2510 Atherosclerotic heart disease of native coronary artery without angina pectoris: Secondary | ICD-10-CM | POA: Diagnosis present

## 2023-07-20 DIAGNOSIS — I10 Essential (primary) hypertension: Secondary | ICD-10-CM | POA: Insufficient documentation

## 2023-07-20 DIAGNOSIS — E7800 Pure hypercholesterolemia, unspecified: Secondary | ICD-10-CM | POA: Diagnosis present

## 2023-07-20 DIAGNOSIS — R600 Localized edema: Secondary | ICD-10-CM | POA: Insufficient documentation

## 2023-07-20 DIAGNOSIS — I2583 Coronary atherosclerosis due to lipid rich plaque: Secondary | ICD-10-CM | POA: Insufficient documentation

## 2023-07-20 LAB — LIPID PANEL

## 2023-07-20 MED ORDER — APIXABAN 5 MG PO TABS: 5.0000 mg | ORAL_TABLET | Freq: Two times a day (BID) | ORAL | 3 refills | Status: DC

## 2023-07-20 NOTE — Addendum Note (Signed)
 Addended by: Cherylyn Cos on: 07/20/2023 03:31 PM   Modules accepted: Orders

## 2023-07-20 NOTE — Progress Notes (Signed)
 Cardiology Note    Date:  07/20/2023   ID:  Edwin Wall, DOB 07-03-1940, MRN [id number]  PCP:  Emaline Handsome, MD  Cardiologist:  Gaylyn Keas, MD   Chief Complaint  Patient presents with   Coronary Artery Disease   Hypertension   Atrial Fibrillation   Hyperlipidemia   Cardiomyopathy    History of Present Illness:  Edwin Wall is a 83 y.o. male with a history of stage III colon cancer, gout, hypertension, prediabetes and PAF.  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  He was started on apixaban  5 mg twice daily for a CHA2DS2-VASc 2 score of now 5.  2D echocardiogram was done 07/16/2020 showing low normal LV function with EF 50 to 55%, mild left atrial enlargement, mild MR. Repeat 2D echocardiogram 12/24/2020 showed EF 45 to 50% with mild LV enlargement and severe akinesis of the apical anterior septal wall and apical segment.  Left atrium was moderately dilated and right atrium mildly dilated.  There was mild MR.  He was admitted to the hospital in November 2022 with syncope that was felt likely more related to weakness rather than loss of consciousness.  Troponin was 21.  It was felt that his issues were due to weight loss, CVA and cancer not from an arrhythmia.  He underwent nuclear stress test which was intermediate risk with peri-infarct ischemia in the basal to apical inferior wall with EF 30 to 44%.    He underwent cardiac cath on 03/11/2021 which showed mild to moderate nonobstructive coronary disease with mildly elevated LVEDP.  There was a 40% proximal RCA, 20% proximal D1, 30% proximal D2, 40% mid LAD and severe disease in the small D3.  Medical management was recommended.    He was seen in A-fib clinic on 03/21/2021 and underwent cardioversion on 04/22/2021.  He was seen back in afib clinic 08/2021 and was back in afib and decision by patient was not to try ADDT and to pursue rate control  strategy going forward.   He is here today for followup and is doing well.  He denies any chest pain or pressure, SOB, DOE, PND, orthopnea,  dizziness, palpitations or syncope. Occasionally he will have some LE edema but sporadic and likely related to Na intake. Occasionally he will feel off balance with the room. He is compliant with his meds and is tolerating meds with no SE.    Past Medical History:  Diagnosis Date   Anxiety    Arthritis    Bilateral leg edema    Cataract    Colon cancer (HCC) dx'd 11/2016   "stage 3"   Gout    Gout    Hemorrhoids    History of kidney stones    "passed it"   Hypertension    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HCC)    EF 45-50% by echo 10/22   PAF (paroxysmal atrial fibrillation) (HCC)    Pre-diabetes     Past Surgical History:  Procedure Laterality Date   CARDIOVERSION [name] 04/23/2021   Procedure: CARDIOVERSION;  Surgeon: Edwin Hamburg, MD;  Location[facility] ENDOSCOPY;  Service: Cardiovascular;  Laterality: [name];   COLECTOMY  12/17/2016   lap; partial sigmoid colectomy/notes 12/17/2016   COLONOSCOPY W/ BIOPSIES AND POLYPECTOMY  11/11/2016   LAPAROSCOPIC SIGMOID COLECTOMY [name] 12/17/2016   Procedure: LAPAROSCOPIC SIGMOID COLECTOMY;  Surgeon: Edwin Rima, MD;  Location: [facility] OR;  Service: General;  Laterality: [name];   LEFT HEART CATH AND CORONARY ANGIOGRAPHY [name] 03/11/2021   Procedure: LEFT HEART CATH AND CORONARY ANGIOGRAPHY;  Surgeon: Edwin Crisp, MD;  Location: [facility] INVASIVE CV LAB;  Service: Cardiovascular;  Laterality: [name];   NASAL SEPTUM SURGERY     TONSILLECTOMY      Current Medications: Current Meds  Medication Sig   apixaban  (ELIQUIS ) 2.5 MG TABS tablet Take 1 tablet (2.5 mg total) by mouth 2 (two) times daily.   colestipol  (COLESTID ) 1 g tablet Take 1 g by mouth daily.   diclofenac  Sodium (VOLTAREN ) 1 % GEL Apply 2 g topically 4 (four) times daily as needed (aches).   ezetimibe  (ZETIA ) 10 MG tablet Take 1 tablet (10 mg total)  by mouth daily.   furosemide  (LASIX ) 20 MG tablet TAKE 1 TABLET(20 MG) BY MOUTH DAILY   levETIRAcetam  (KEPPRA ) 750 MG tablet TAKE 1 TABLET(750 MG) BY MOUTH TWICE DAILY   liver oil-zinc oxide (DESITIN) 40 % ointment Apply 1 application  topically as needed for irritation.   neomycin-bacitracin-polymyxin (NEOSPORIN) ointment Apply 1 application  topically as needed for wound care.   potassium chloride  (KLOR-CON  M) 10 MEQ tablet Take 10 mEq by mouth daily.   rosuvastatin  (CRESTOR ) 40 MG tablet Take 1 tablet (40 mg total) by mouth daily.    Allergies:   Patient has no known allergies.   Social History   Socioeconomic History   Marital status: Married    Spouse name: Not on file   Number of children: Not on file   Years of education: Not on file   Highest education level: Not on file  Occupational History   Not on file  Tobacco Use   Smoking status: Never   Smokeless tobacco: Never   Tobacco comments:    Never smoke 05/21/21  Vaping Use   Vaping status: Never Used  Substance and Sexual Activity   Alcohol use: No   Drug use: No   Sexual activity: Never  Other Topics Concern   Not on file  Social History Narrative   Right and left handed   Lives with son one story   Caffeine 2 daily   V[employer] of [employer]    Social Drivers of Health   Financial Resource Strain: Low Risk  (04/27/2023)   Received from [facility]   Overall Financial Resource Strain (CARDIA)    Difficulty of Paying Living Expenses: Not hard at all  Food Insecurity: No Food Insecurity (04/27/2023)   Received from [facility]   Hunger Vital Sign    Worried About Running Out of Food in the Last Year: Never true    Ran Out of Food in the Last Year: Never true  Transportation Needs: No Transportation Needs (04/27/2023)   Received from [facility] - Transportation    Lack of Transportation (Medical): No    Lack of Transportation (Non-Medical): No  Physical Activity: Insufficiently Active (06/25/2022)    Received from [facility], [facility]   Exercise Vital Sign    Days of Exercise per Week: 5 days    Minutes of Exercise per Session: 20 min  Stress: No Stress Concern Present (06/25/2022)   Received from [facility], [facility] - Occupational Stress Questionnaire    Feeling of Stress : Not at all  Social Connections: Moderately Integrated (06/25/2022)   Received from [facility], [facility]   Social Network    How would you rate your social network (family,  work, friends)?: Adequate participation with social networks     Family History:  The patient's family history includes COPD in his father; Cancer in his mother.   ROS:   Please see the history of present illness.    ROS All other systems reviewed and are negative.      No data to display             PHYSICAL EXAM:   VS:  BP 134/82   Pulse 63   Ht 5\' 8"  (1.727 m)   Wt 220 lb 6.4 oz (100 kg)   SpO2 99%   BMI 33.51 kg/m    GEN: Well nourished, well developed in no acute distress HEENT: Normal NECK: No JVD; No carotid bruits LYMPHATICS: No lymphadenopathy CARDIAC:RRR, no murmurs, rubs, gallops RESPIRATORY:  Clear to auscultation without rales, wheezing or rhonchi  ABDOMEN: Soft, non-tender, non-distended MUSCULOSKELETAL:  1=2+ BLE edema; No deformity  SKIN: Warm and dry NEUROLOGIC:  Alert and oriented x 3 PSYCHIATRIC:  Normal affect  Wt Readings from Last 3 Encounters:  07/20/23 220 lb 6.4 oz (100 kg)  07/21/22 202 lb 9.6 oz (91.9 kg)  04/23/22 206 lb (93.4 kg)      Studies/Labs Reviewed:   EKG Interpretation Date/Time:  Monday Jul 20 2023 14:47:38 EDT Ventricular Rate:  63 PR Interval:    QRS Duration:  132 QT Interval:  436 QTC Calculation: 446 R Axis:   -71  Text Interpretation: Atrial fibrillation Right bundle branch block Left anterior fascicular block Bifascicular block Possible Lateral infarct , age undetermined When compared with ECG of  05-Mar-2022 10:01, PREVIOUS ECG IS PRESENT No significant change since last tracing Confirmed by Gaylyn Keas ([id number]) on 07/20/2023 3:03:28 PM   Recent Labs: No results found for requested labs within last 365 days.   Lipid Panel    Component Value Date/Time   CHOL 93 (L) 04/23/2022 1542   TRIG 78 04/23/2022 1542   HDL 42 04/23/2022 1542   CHOLHDL 2.2 04/23/2022 1542   CHOLHDL 5.3 07/16/2020 0337   VLDL 18 07/16/2020 0337   LDLCALC 35 04/23/2022 1542     Additional studies/ records that were reviewed today include:  OV notes from PCP    ASSESSMENT:    1. Persistent atrial fibrillation (HCC)   2. Primary hypertension   3. Acute CVA (cerebrovascular accident) (HCC)   4. Bilateral leg edema   5. 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HCC)   6. Coronary artery disease due to lipid rich plaque   7. Pure hypercholesterolemia      PLAN:  In order of problems listed above:  Persistent atrial fibrillation -He has been followed in atrial fibrillation clinic -Status post DCCV to normal sinus rhythm on 04/23/2021 -See back in A-fib clinic June 2023 and was back in A-fib.   -AADT was discussed but patient's desire was to pursue a conservative rate control strateg -Denies any palpitations and no bleeding problems on DOAC -2D echo showed moderate left atrial enlargement -He was on Eliquis  2.5 mg twice daily in the past because of serum creatinine greater than 1.5 but blood work over the past few months his serum creatinine has been 1.29-1.47. -Will increase Eliquis  back to 5 mg twice daily -Repeat BMP today  2  Hypertension - BP controlled on today -He has not required any blood pressure medicine  3.  History of L MCA -occurred in setting of new onset atrial fibrillation -Continue Eliquis   4.  LE edema -likely exacerbated by atrial fibrillation -I  encouraged him to use his compression hose and encouraged him to wear them everyday -Continue Lasix  20 mg daily  5.  Nonischemic  dilated cardiomyopathy -nuclear stress test done for syncope was intermediate risk with peri-infarct ischemia in the basal to apical inferior wall with EF 30 to 44%.   -cardiac cath on 03/11/2021 showed mild to moderate nonobstructive coronary disease with mildly elevated LVEDP.  There was a 40% proximal RCA, 20% proximal D1, 30% proximal D2, 40% mid LAD and severe disease in the small D3.   -2D echo 12/24/2020 showed mild LV dysfunction with EF 45 to 50% with severe akinesis of the apical anterior septal and apical segments. -2D echo 02/25/22 with EF 45-50% -GDMT: No ACE/ARB/Entresto due to CKD and no hydralazine /Imdur/SGLT2i due to history of soft blood pressures in the past -He appears euvolemic on exam today -Continue Lasix  20 mg daily with as needed refills  6.  ASCAD -cardiac cath on 03/11/2021 which showed mild to moderate nonobstructive coronary disease with mildly elevated LVEDP.  There was a 40% proximal RCA, 20% proximal D1, 30% proximal D2, 40% mid LAD and severe disease in the small D3.  Medical management was recommended - He denies any anginal symptoms since I saw him last - No aspirin  due to DOAC - Continue Crestor  40 mg daily, Zetia  10 mg daily with as needed refills - No beta-blocker due to soft BP in the past  7.  Hyperlipidemia - LDL goal less than 70 - He has not had lipids done last year so we will check an FLP and ALT - Continue Crestor  40 mg daily and Zetia  10 mg daily with as needed refills  Followup with me in 6 months  Medication Adjustments/Labs and Tests Ordered: Current medicines are reviewed at length with the patient today.  Concerns regarding medicines are outlined above.  Medication changes, Labs and Tests ordered today are listed in the Patient Instructions below.  There are no Patient Instructions on file for this visit.   Signed, Gaylyn Keas, MD  07/20/2023 3:18 PM    [facility] [address] [place], [place], [place]  [address] Phone:  [phone]; Fax: [phone]

## 2023-07-20 NOTE — Patient Instructions (Addendum)
 Medication Instructions:  Please INCREASE your dose of eliquis  to 5 mg BID.  *If you need a refill on your cardiac medications before your next appointment, please call your pharmacy*  Lab Work: Please complete a CMET, CBC, and a FASTING lipid panel in our first floor lab before you leave today.   If you have labs (blood work) drawn today and your tests are completely normal, you will receive your results only by: MyChart Message (if you have MyChart) OR A paper copy in the mail If you have any lab test that is abnormal or we need to change your treatment, we will call you to review the results.  Testing/Procedures: None.  Follow-Up: At [facility], you and your health needs are our priority.  As part of our continuing mission to provide you with exceptional heart care, our providers are all part of one team.  This team includes your primary Cardiologist (physician) and Advanced Practice Providers or APPs (Physician Assistants and Nurse Practitioners) who all work together to provide you with the care you need, when you need it.  Your next appointment:   6 months  Provider:   Gaylyn Keas, MD

## 2023-07-21 ENCOUNTER — Ambulatory Visit: Payer: Self-pay | Admitting: Cardiology

## 2023-07-21 LAB — CBC WITH DIFFERENTIAL/PLATELET
Basophils Absolute: 0.1 10*3/uL (ref 0.0–0.2)
Basos: 1 %
EOS (ABSOLUTE): 0.3 10*3/uL (ref 0.0–0.4)
Eos: 4 %
Hematocrit: 42.2 % (ref 37.5–51.0)
Hemoglobin: 13.5 g/dL (ref 13.0–17.7)
Immature Grans (Abs): 0 10*3/uL (ref 0.0–0.1)
Immature Granulocytes: 0 %
Lymphocytes Absolute: 1.6 10*3/uL (ref 0.7–3.1)
Lymphs: 28 %
MCH: 30.8 pg (ref 26.6–33.0)
MCHC: 32 g/dL (ref 31.5–35.7)
MCV: 96 fL (ref 79–97)
Monocytes Absolute: 0.7 10*3/uL (ref 0.1–0.9)
Monocytes: 12 %
Neutrophils Absolute: 3.2 10*3/uL (ref 1.4–7.0)
Neutrophils: 55 %
Platelets: 152 10*3/uL (ref 150–450)
RBC: 4.39 x10E6/uL (ref 4.14–5.80)
RDW: 12 % (ref 11.6–15.4)
WBC: 5.9 10*3/uL (ref 3.4–10.8)

## 2023-07-21 LAB — COMPREHENSIVE METABOLIC PANEL WITH GFR
ALT: 13 IU/L (ref 0–44)
AST: 21 IU/L (ref 0–40)
Albumin: 3.6 g/dL — ABNORMAL LOW (ref 3.7–4.7)
Alkaline Phosphatase: 96 IU/L (ref 44–121)
BUN/Creatinine Ratio: 14 (ref 10–24)
BUN: 21 mg/dL (ref 8–27)
Bilirubin Total: 0.9 mg/dL (ref 0.0–1.2)
CO2: 20 mmol/L (ref 20–29)
Calcium: 9.1 mg/dL (ref 8.6–10.2)
Chloride: 106 mmol/L (ref 96–106)
Creatinine, Ser: 1.52 mg/dL — ABNORMAL HIGH (ref 0.76–1.27)
Globulin, Total: 2.4 g/dL (ref 1.5–4.5)
Glucose: 88 mg/dL (ref 70–99)
Potassium: 4.7 mmol/L (ref 3.5–5.2)
Sodium: 144 mmol/L (ref 134–144)
Total Protein: 6 g/dL (ref 6.0–8.5)
eGFR: 45 mL/min/{1.73_m2} — ABNORMAL LOW (ref >59)

## 2023-07-21 LAB — LIPID PANEL
Chol/HDL Ratio: 2.2 ratio (ref 0.0–5.0)
Cholesterol, Total: 80 mg/dL — ABNORMAL LOW (ref 100–199)
HDL: 36 mg/dL — ABNORMAL LOW (ref >39)
LDL Chol Calc (NIH): 29 mg/dL (ref 0–99)
Triglycerides: 70 mg/dL (ref 0–149)
VLDL Cholesterol Cal: 15 mg/dL (ref 5–40)

## 2023-07-22 NOTE — Telephone Encounter (Signed)
-----   Message from Gaylyn Keas sent at 07/21/2023 11:11 PM EDT ----- SCr back up above 1.5 so please drop ELiquis  back to 2.5mg  BID

## 2023-07-22 NOTE — Telephone Encounter (Signed)
 Unable to leave voice message; mail box is not set up.

## 2023-07-28 NOTE — Telephone Encounter (Signed)
 Called both numbers listed on chart, unable to leave VM.

## 2023-07-30 NOTE — Telephone Encounter (Signed)
 Attempted to call VM, no answer. Trying to discuss labs and need to decrease dose of eliquis . Letter sent.

## 2023-07-30 NOTE — Telephone Encounter (Signed)
-----   Message from Gaylyn Keas sent at 07/21/2023 11:07 PM EDT ----- Stable labs - continue current meds and forward to PCP

## 2023-08-06 ENCOUNTER — Telehealth: Payer: Self-pay | Admitting: Cardiology

## 2023-08-06 NOTE — Telephone Encounter (Signed)
 Wife Monroe Antigua) returned RN's call.

## 2023-08-06 NOTE — Telephone Encounter (Signed)
 Pt is returning call regarding results and receiving a letter. Pt is requesting a callback. Please advise

## 2023-08-10 MED ORDER — APIXABAN 2.5 MG PO TABS: 2.5000 mg | ORAL_TABLET | Freq: Two times a day (BID) | ORAL | 3 refills | Status: DC

## 2023-08-10 NOTE — Telephone Encounter (Signed)
 Call to patient to discuss lab results. Spoke to spouse Monroe Antigua and patient, advised that creatinine was elevated and Dr. Micael Adas recommends to decrease dose of eliquis  to 2.5 mg BID. Monroe Antigua states patient has a previous unexpired refill at that dose that she will use. Cherylynn Cosier I would update chart and to call us  when she is ready for a refill at the lower dose.

## 2023-09-08 ENCOUNTER — Ambulatory Visit: Payer: Medicare Other | Admitting: Neurology

## 2023-09-11 ENCOUNTER — Other Ambulatory Visit: Payer: Self-pay | Admitting: Cardiology

## 2023-09-11 DIAGNOSIS — E7800 Pure hypercholesterolemia, unspecified: Secondary | ICD-10-CM

## 2023-09-11 DIAGNOSIS — Z79899 Other long term (current) drug therapy: Secondary | ICD-10-CM

## 2023-10-08 ENCOUNTER — Inpatient Hospital Stay (HOSPITAL_COMMUNITY)
Admission: EM | Admit: 2023-10-08 | Discharge: 2023-11-02 | DRG: 871 | Disposition: E | Attending: Emergency Medicine | Admitting: Emergency Medicine

## 2023-10-08 ENCOUNTER — Emergency Department (HOSPITAL_COMMUNITY)

## 2023-10-08 ENCOUNTER — Encounter (HOSPITAL_COMMUNITY): Payer: Self-pay

## 2023-10-08 ENCOUNTER — Other Ambulatory Visit: Payer: Self-pay

## 2023-10-08 DIAGNOSIS — I6789 Other cerebrovascular disease: Secondary | ICD-10-CM | POA: Diagnosis present

## 2023-10-08 DIAGNOSIS — F419 Anxiety disorder, unspecified: Secondary | ICD-10-CM | POA: Diagnosis present

## 2023-10-08 DIAGNOSIS — E861 Hypovolemia: Secondary | ICD-10-CM | POA: Diagnosis present

## 2023-10-08 DIAGNOSIS — E872 Acidosis, unspecified: Secondary | ICD-10-CM | POA: Diagnosis present

## 2023-10-08 DIAGNOSIS — Z860100 Personal history of colon polyps, unspecified: Secondary | ICD-10-CM

## 2023-10-08 DIAGNOSIS — I5042 Chronic combined systolic (congestive) and diastolic (congestive) heart failure: Secondary | ICD-10-CM | POA: Diagnosis present

## 2023-10-08 DIAGNOSIS — R6521 Severe sepsis with septic shock: Secondary | ICD-10-CM | POA: Diagnosis present

## 2023-10-08 DIAGNOSIS — R570 Cardiogenic shock: Secondary | ICD-10-CM | POA: Diagnosis not present

## 2023-10-08 DIAGNOSIS — I69351 Hemiplegia and hemiparesis following cerebral infarction affecting right dominant side: Secondary | ICD-10-CM

## 2023-10-08 DIAGNOSIS — Z515 Encounter for palliative care: Secondary | ICD-10-CM

## 2023-10-08 DIAGNOSIS — M109 Gout, unspecified: Secondary | ICD-10-CM | POA: Diagnosis present

## 2023-10-08 DIAGNOSIS — N1831 Chronic kidney disease, stage 3a: Secondary | ICD-10-CM | POA: Diagnosis present

## 2023-10-08 DIAGNOSIS — R569 Unspecified convulsions: Secondary | ICD-10-CM

## 2023-10-08 DIAGNOSIS — Z79899 Other long term (current) drug therapy: Secondary | ICD-10-CM

## 2023-10-08 DIAGNOSIS — R54 Age-related physical debility: Secondary | ICD-10-CM | POA: Diagnosis present

## 2023-10-08 DIAGNOSIS — M6282 Rhabdomyolysis: Secondary | ICD-10-CM | POA: Diagnosis not present

## 2023-10-08 DIAGNOSIS — I6622 Occlusion and stenosis of left posterior cerebral artery: Secondary | ICD-10-CM | POA: Diagnosis present

## 2023-10-08 DIAGNOSIS — T796XXA Traumatic ischemia of muscle, initial encounter: Secondary | ICD-10-CM | POA: Diagnosis present

## 2023-10-08 DIAGNOSIS — E119 Type 2 diabetes mellitus without complications: Secondary | ICD-10-CM

## 2023-10-08 DIAGNOSIS — M25551 Pain in right hip: Secondary | ICD-10-CM | POA: Diagnosis present

## 2023-10-08 DIAGNOSIS — I63512 Cerebral infarction due to unspecified occlusion or stenosis of left middle cerebral artery: Secondary | ICD-10-CM | POA: Diagnosis present

## 2023-10-08 DIAGNOSIS — E785 Hyperlipidemia, unspecified: Secondary | ICD-10-CM | POA: Diagnosis present

## 2023-10-08 DIAGNOSIS — Z66 Do not resuscitate: Secondary | ICD-10-CM | POA: Diagnosis not present

## 2023-10-08 DIAGNOSIS — M25562 Pain in left knee: Secondary | ICD-10-CM | POA: Diagnosis present

## 2023-10-08 DIAGNOSIS — R579 Shock, unspecified: Secondary | ICD-10-CM | POA: Diagnosis present

## 2023-10-08 DIAGNOSIS — I469 Cardiac arrest, cause unspecified: Secondary | ICD-10-CM | POA: Diagnosis not present

## 2023-10-08 DIAGNOSIS — I69398 Other sequelae of cerebral infarction: Secondary | ICD-10-CM

## 2023-10-08 DIAGNOSIS — Z9089 Acquired absence of other organs: Secondary | ICD-10-CM

## 2023-10-08 DIAGNOSIS — J9612 Chronic respiratory failure with hypercapnia: Secondary | ICD-10-CM

## 2023-10-08 DIAGNOSIS — Z825 Family history of asthma and other chronic lower respiratory diseases: Secondary | ICD-10-CM

## 2023-10-08 DIAGNOSIS — I420 Dilated cardiomyopathy: Secondary | ICD-10-CM | POA: Diagnosis present

## 2023-10-08 DIAGNOSIS — I69318 Other symptoms and signs involving cognitive functions following cerebral infarction: Secondary | ICD-10-CM

## 2023-10-08 DIAGNOSIS — Z87442 Personal history of urinary calculi: Secondary | ICD-10-CM

## 2023-10-08 DIAGNOSIS — I428 Other cardiomyopathies: Secondary | ICD-10-CM

## 2023-10-08 DIAGNOSIS — Z85038 Personal history of other malignant neoplasm of large intestine: Secondary | ICD-10-CM

## 2023-10-08 DIAGNOSIS — I10 Essential (primary) hypertension: Secondary | ICD-10-CM | POA: Diagnosis present

## 2023-10-08 DIAGNOSIS — R680 Hypothermia, not associated with low environmental temperature: Secondary | ICD-10-CM | POA: Diagnosis not present

## 2023-10-08 DIAGNOSIS — H269 Unspecified cataract: Secondary | ICD-10-CM | POA: Diagnosis present

## 2023-10-08 DIAGNOSIS — W010XXA Fall on same level from slipping, tripping and stumbling without subsequent striking against object, initial encounter: Secondary | ICD-10-CM | POA: Diagnosis present

## 2023-10-08 DIAGNOSIS — R238 Other skin changes: Secondary | ICD-10-CM | POA: Diagnosis present

## 2023-10-08 DIAGNOSIS — M1990 Unspecified osteoarthritis, unspecified site: Secondary | ICD-10-CM | POA: Diagnosis present

## 2023-10-08 DIAGNOSIS — G9341 Metabolic encephalopathy: Secondary | ICD-10-CM | POA: Diagnosis present

## 2023-10-08 DIAGNOSIS — Y92092 Bedroom in other non-institutional residence as the place of occurrence of the external cause: Secondary | ICD-10-CM

## 2023-10-08 DIAGNOSIS — I509 Heart failure, unspecified: Secondary | ICD-10-CM

## 2023-10-08 DIAGNOSIS — I69322 Dysarthria following cerebral infarction: Secondary | ICD-10-CM

## 2023-10-08 DIAGNOSIS — R414 Neurologic neglect syndrome: Secondary | ICD-10-CM | POA: Diagnosis present

## 2023-10-08 DIAGNOSIS — M4802 Spinal stenosis, cervical region: Secondary | ICD-10-CM | POA: Diagnosis present

## 2023-10-08 DIAGNOSIS — C187 Malignant neoplasm of sigmoid colon: Secondary | ICD-10-CM | POA: Diagnosis present

## 2023-10-08 DIAGNOSIS — I69392 Facial weakness following cerebral infarction: Secondary | ICD-10-CM

## 2023-10-08 DIAGNOSIS — I130 Hypertensive heart and chronic kidney disease with heart failure and stage 1 through stage 4 chronic kidney disease, or unspecified chronic kidney disease: Secondary | ICD-10-CM | POA: Diagnosis present

## 2023-10-08 DIAGNOSIS — E875 Hyperkalemia: Secondary | ICD-10-CM | POA: Diagnosis not present

## 2023-10-08 DIAGNOSIS — G40909 Epilepsy, unspecified, not intractable, without status epilepticus: Secondary | ICD-10-CM | POA: Diagnosis present

## 2023-10-08 DIAGNOSIS — Z8719 Personal history of other diseases of the digestive system: Secondary | ICD-10-CM

## 2023-10-08 DIAGNOSIS — A419 Sepsis, unspecified organism: Secondary | ICD-10-CM | POA: Diagnosis not present

## 2023-10-08 DIAGNOSIS — J9601 Acute respiratory failure with hypoxia: Secondary | ICD-10-CM | POA: Diagnosis not present

## 2023-10-08 DIAGNOSIS — Y9389 Activity, other specified: Secondary | ICD-10-CM

## 2023-10-08 DIAGNOSIS — I9589 Other hypotension: Secondary | ICD-10-CM | POA: Diagnosis present

## 2023-10-08 DIAGNOSIS — I4891 Unspecified atrial fibrillation: Secondary | ICD-10-CM | POA: Diagnosis present

## 2023-10-08 DIAGNOSIS — K7200 Acute and subacute hepatic failure without coma: Secondary | ICD-10-CM | POA: Diagnosis not present

## 2023-10-08 DIAGNOSIS — I480 Paroxysmal atrial fibrillation: Secondary | ICD-10-CM | POA: Diagnosis present

## 2023-10-08 DIAGNOSIS — I639 Cerebral infarction, unspecified: Principal | ICD-10-CM | POA: Diagnosis present

## 2023-10-08 DIAGNOSIS — Z9889 Other specified postprocedural states: Secondary | ICD-10-CM

## 2023-10-08 DIAGNOSIS — G9389 Other specified disorders of brain: Secondary | ICD-10-CM | POA: Diagnosis present

## 2023-10-08 DIAGNOSIS — E669 Obesity, unspecified: Secondary | ICD-10-CM | POA: Diagnosis present

## 2023-10-08 DIAGNOSIS — Z7901 Long term (current) use of anticoagulants: Secondary | ICD-10-CM

## 2023-10-08 DIAGNOSIS — Z801 Family history of malignant neoplasm of trachea, bronchus and lung: Secondary | ICD-10-CM

## 2023-10-08 DIAGNOSIS — E1122 Type 2 diabetes mellitus with diabetic chronic kidney disease: Secondary | ICD-10-CM | POA: Diagnosis present

## 2023-10-08 DIAGNOSIS — Z9049 Acquired absence of other specified parts of digestive tract: Secondary | ICD-10-CM

## 2023-10-08 DIAGNOSIS — N179 Acute kidney failure, unspecified: Secondary | ICD-10-CM | POA: Diagnosis present

## 2023-10-08 DIAGNOSIS — R296 Repeated falls: Secondary | ICD-10-CM | POA: Diagnosis present

## 2023-10-08 DIAGNOSIS — I69320 Aphasia following cerebral infarction: Secondary | ICD-10-CM

## 2023-10-08 LAB — I-STAT CHEM 8, ED
BUN: 37 mg/dL — ABNORMAL HIGH (ref 8–23)
Calcium, Ion: 1 mmol/L — ABNORMAL LOW (ref 1.15–1.40)
Chloride: 105 mmol/L (ref 98–111)
Creatinine, Ser: 2.1 mg/dL — ABNORMAL HIGH (ref 0.61–1.24)
Glucose, Bld: 172 mg/dL — ABNORMAL HIGH (ref 70–99)
HCT: 43 % (ref 39.0–52.0)
Hemoglobin: 14.6 g/dL (ref 13.0–17.0)
Potassium: 3.8 mmol/L (ref 3.5–5.1)
Sodium: 140 mmol/L (ref 135–145)
TCO2: 20 mmol/L — ABNORMAL LOW (ref 22–32)

## 2023-10-08 LAB — CBG MONITORING, ED: Glucose-Capillary: 152 mg/dL — ABNORMAL HIGH (ref 70–99)

## 2023-10-08 MED ORDER — IOHEXOL 350 MG/ML SOLN
100.0000 mL | Freq: Once | INTRAVENOUS | Status: AC | PRN
  Administered 2023-10-08: 100 mL via INTRAVENOUS

## 2023-10-08 NOTE — ED Triage Notes (Signed)
 Pt from home via EMS.  Pt found lying on ground by son at 2200, pt states he fell at 1430.  Pt fully oriented.  Pt is unable to move R arm, bilateral leg weakness.

## 2023-10-08 NOTE — ED Notes (Signed)
 Pt placed on monitor and CCMD contacted.

## 2023-10-08 NOTE — Code Documentation (Signed)
 Stroke Response Nurse Documentation Code Documentation  Edwin Wall is a 83 y.o. male arriving to [facility]  via [facility] on 10/08/2023 with past medical hx of previous stroke.  On Eliquis  (apixaban ) daily. Code stroke was activated by ED.   Patient from home where he was LKW at 1430 and now complaining of right sided weakness. Patient was trying to help his wife off the ground when his right side gave out and he fell on top of her. They were stuck on the ground until family arrived about 30 minutes prior to the patients arrival to the ED.  On stroke team arrival, patient was in CT with the neurologist and bedside RN. NIHSS 14, see documentation for details and code stroke times. Patient with disoriented, right facial droop, right arm weakness, bilateral leg weakness, and Sensory  neglect on exam. The following imaging was completed:  CT Head and CTA. Patient is not a candidate for IV Thrombolytic due to out of window. Patient is not a candidate for IR due to no LVO on imaging.   Care Plan: Q2 hour vitals and NIH    Bedside handoff with ED RN Caron Jessee Delon Eloy  Stroke Response RN

## 2023-10-08 NOTE — ED Notes (Signed)
 Pt transported to CT with RN

## 2023-10-08 NOTE — ED Provider Notes (Signed)
 [place] EMERGENCY DEPARTMENT AT [facility] Provider Note   CSN: [id number] Arrival date & time: 10/08/23  2308     History No chief complaint on file.   HPI Edwin Wall is a 83 y.o. male presenting for chief complaint of right sided weakness.  He is an otherwise chronically ill 83 year old male though functional.  Report from EMS is that patient to try to get his wife off the ground at 2 PM but when he attempted to, his right side went suddenly numb and he fell onto her.  They were stuck on the ground together until the family arrived approximate 30 minutes prior to arrival.  EMS was called.  Patient has complete right sided dense hemiparesis. He states he has a history of strokes but this is new. He denies fevers chills nausea vomiting syncope shortness of breath.  Patient's recorded medical, surgical, social, medication list and allergies were reviewed in the Snapshot window as part of the initial history.   Review of Systems   Review of Systems  Constitutional:  Positive for fatigue. Negative for chills and fever.  HENT:  Negative for ear pain and sore throat.   Eyes:  Negative for pain and visual disturbance.  Respiratory:  Negative for cough and shortness of breath.   Cardiovascular:  Negative for chest pain and palpitations.  Gastrointestinal:  Negative for abdominal pain and vomiting.  Genitourinary:  Negative for dysuria and hematuria.  Musculoskeletal:  Negative for arthralgias and back pain.  Skin:  Negative for color change and rash.  Neurological:  Positive for weakness. Negative for seizures and syncope.  All other systems reviewed and are negative.   Physical Exam Updated Vital Signs There were no vitals taken for this visit. Physical Exam Vitals and nursing note reviewed.  Constitutional:      General: He is not in acute distress.    Appearance: He is well-developed.  HENT:     Head: Normocephalic and atraumatic.  Eyes:      Conjunctiva/sclera: Conjunctivae normal.  Cardiovascular:     Rate and Rhythm: Normal rate and regular rhythm.     Heart sounds: No murmur heard. Pulmonary:     Effort: Pulmonary effort is normal. No respiratory distress.     Breath sounds: Normal breath sounds.  Abdominal:     Palpations: Abdomen is soft.     Tenderness: There is no abdominal tenderness.  Musculoskeletal:        General: No swelling or deformity.     Cervical back: Neck supple.  Skin:    General: Skin is warm and dry.     Capillary Refill: Capillary refill takes less than 2 seconds.  Neurological:     Mental Status: He is alert.     Comments: Sensation intact in the left lower extremity, left upper extremity with full range of motion.  Right lower extremity 1 out of 5 strength, right upper extremity 0 out of 5 strength. No focal cranial deficits on initial exam.  Psychiatric:        Mood and Affect: Mood normal.      ED Course/ Medical Decision Making/ A&P    Procedures Procedures   Medications Ordered in ED Medications - No data to display Medical Decision Making:    Edwin Wall is a 83 y.o. male  who presented to the ED today with right sided weakness.  Due to these symptoms, I activated a CODE STROKE per hospital protocol.   Patient placed on continuous  vitals and telemetry monitoring while in ED which was reviewed periodically.   On my initial exam, the pt was in no acute distress, glucose was WNL and deficits include right sided weakness.  Deficits are persistent.   Reviewed and confirmed nursing documentation for past medical history, family history, social history.   Notably an effort was made to reach out to contacts regarding the following historical factors concerning contraindications for TPA: Outside of the window for [facility] administratio  Initial Assessment and Plan:   Patient immediately evaluated jointly by eurology and emergency department providers.   This is most consistent  with an acute life/limb threatening illness complicated by underlying chronic conditions.  ***Patient's HPI and PE findings are most consistent with acute CVA. Proceeded with [facility] and CTA per institutional stroke policy which revealed ***. Given the timing and severity of the patient's symptoms, patient is a TPA candidate per neurology. Treatment initiated and patient monitored Q70minutes while being arranged for admission to the neurology ICU. BP controlled with *** to ensure safe administration of the thrombolytics. Patient transferred into the ICU at this time with no further acute events   ***Patient's HPI and PE findings are most consistent with completed CVA. Proceeded with [facility] and CTA per institutional policy which revealed ***. ED/neurology evaluation is most consistent with LVO. Given the nature/timing of the symptoms, <48 hours, patient is a candidate for thrombectomy. Patient stabilized in ED while being arranged for IR/NS intervention. Patient admitted to the neuro ICU with no further acute events.   ***Patient's HPI and PE findings are most consistent with completed CVA. Proceeded with [facility] and CTA per institutional policy which revealed ***. ED/neurology evaluation is most consistent with LVO. Given the nature/timing of the symptoms, delayed presentation, patient is not a candidate for thrombectomy. Patient stabilized in ED while being arranged for neurology admission.   ***Patient's HPI and PE findings are most consistent with TIA. Proceeded with [facility] and CTA per institutional policy which revealed ***. ED/neurology evaluation is most consistent with TIA with resolved symptoms at this time. Given the nature/timing of the symptoms, patient requires MRI and further risk stratification.   ***Patient evaluated per code stroke protocol with immediate cross-sectional imaging of the head via CT head to evaluate for intracranial hemorrhage.  ***This was augmented with CT angiography and perfusional  imaging of the brain for further evaluation of large vessel occlusion.   Per neurology, these rapid studies revealed no acute pathology. Neurology feels that patient's presentation is more consistent with alternative pathology given these findings.  Differential includes metabolic encephalopathy, medication encephalopathy, infectious encephalopathy. ***Neurology has recommended an MRI for further differentiation of patient's syndrome and evaluation for any ischemic disease while completing a metabolic/infectious workup with laboratory evaluation per EMR.  Initial Study Results: Labs Labs reviewed without evidence of clinically relevant abnormality. ***Exceptions include:***  ***EKG EKG was reviewed independently. Rate, rhythm, axis, intervals all examined and without medically relevant abnormality. ST segments without concerns for elevations.    Radiology  Images reviewed independently, agree with radiology report at this time.   CT HEAD CODE STROKE WO CONTRAST    (Results Pending)  CT ANGIO HEAD NECK W WO CM W PERF (CODE STROKE)    (Results Pending)      Final Assessment and Plan:   ***    Clinical Impression: No diagnosis found.   Data Unavailable   Final Clinical Impression(s) / ED Diagnoses Final diagnoses:  None    Rx / DC Orders ED Discharge Orders  None

## 2023-10-09 ENCOUNTER — Inpatient Hospital Stay (HOSPITAL_COMMUNITY)

## 2023-10-09 ENCOUNTER — Emergency Department (HOSPITAL_COMMUNITY)

## 2023-10-09 DIAGNOSIS — E872 Acidosis, unspecified: Secondary | ICD-10-CM | POA: Diagnosis present

## 2023-10-09 DIAGNOSIS — I6930 Unspecified sequelae of cerebral infarction: Secondary | ICD-10-CM | POA: Diagnosis not present

## 2023-10-09 DIAGNOSIS — Z66 Do not resuscitate: Secondary | ICD-10-CM | POA: Diagnosis not present

## 2023-10-09 DIAGNOSIS — A419 Sepsis, unspecified organism: Secondary | ICD-10-CM | POA: Diagnosis present

## 2023-10-09 DIAGNOSIS — I6389 Other cerebral infarction: Secondary | ICD-10-CM

## 2023-10-09 DIAGNOSIS — I69351 Hemiplegia and hemiparesis following cerebral infarction affecting right dominant side: Secondary | ICD-10-CM | POA: Diagnosis not present

## 2023-10-09 DIAGNOSIS — M6282 Rhabdomyolysis: Secondary | ICD-10-CM | POA: Diagnosis present

## 2023-10-09 DIAGNOSIS — I4891 Unspecified atrial fibrillation: Secondary | ICD-10-CM

## 2023-10-09 DIAGNOSIS — I639 Cerebral infarction, unspecified: Secondary | ICD-10-CM

## 2023-10-09 DIAGNOSIS — K7200 Acute and subacute hepatic failure without coma: Secondary | ICD-10-CM | POA: Diagnosis not present

## 2023-10-09 DIAGNOSIS — N1831 Chronic kidney disease, stage 3a: Secondary | ICD-10-CM | POA: Diagnosis present

## 2023-10-09 DIAGNOSIS — R570 Cardiogenic shock: Secondary | ICD-10-CM | POA: Diagnosis not present

## 2023-10-09 DIAGNOSIS — R414 Neurologic neglect syndrome: Secondary | ICD-10-CM | POA: Diagnosis present

## 2023-10-09 DIAGNOSIS — I5042 Chronic combined systolic (congestive) and diastolic (congestive) heart failure: Secondary | ICD-10-CM | POA: Diagnosis present

## 2023-10-09 DIAGNOSIS — R579 Shock, unspecified: Secondary | ICD-10-CM | POA: Diagnosis not present

## 2023-10-09 DIAGNOSIS — Y92092 Bedroom in other non-institutional residence as the place of occurrence of the external cause: Secondary | ICD-10-CM | POA: Diagnosis not present

## 2023-10-09 DIAGNOSIS — R569 Unspecified convulsions: Secondary | ICD-10-CM

## 2023-10-09 DIAGNOSIS — W010XXA Fall on same level from slipping, tripping and stumbling without subsequent striking against object, initial encounter: Secondary | ICD-10-CM | POA: Diagnosis present

## 2023-10-09 DIAGNOSIS — G9341 Metabolic encephalopathy: Secondary | ICD-10-CM | POA: Diagnosis present

## 2023-10-09 DIAGNOSIS — Y9389 Activity, other specified: Secondary | ICD-10-CM | POA: Diagnosis not present

## 2023-10-09 DIAGNOSIS — I469 Cardiac arrest, cause unspecified: Secondary | ICD-10-CM | POA: Diagnosis not present

## 2023-10-09 DIAGNOSIS — Z515 Encounter for palliative care: Secondary | ICD-10-CM | POA: Diagnosis not present

## 2023-10-09 DIAGNOSIS — N179 Acute kidney failure, unspecified: Secondary | ICD-10-CM | POA: Diagnosis present

## 2023-10-09 DIAGNOSIS — G40909 Epilepsy, unspecified, not intractable, without status epilepticus: Secondary | ICD-10-CM | POA: Diagnosis present

## 2023-10-09 DIAGNOSIS — I5022 Chronic systolic (congestive) heart failure: Secondary | ICD-10-CM | POA: Diagnosis not present

## 2023-10-09 DIAGNOSIS — I6622 Occlusion and stenosis of left posterior cerebral artery: Secondary | ICD-10-CM | POA: Diagnosis present

## 2023-10-09 DIAGNOSIS — G9389 Other specified disorders of brain: Secondary | ICD-10-CM | POA: Diagnosis present

## 2023-10-09 DIAGNOSIS — R29712 NIHSS score 12: Secondary | ICD-10-CM

## 2023-10-09 DIAGNOSIS — R4182 Altered mental status, unspecified: Secondary | ICD-10-CM | POA: Diagnosis not present

## 2023-10-09 DIAGNOSIS — R29714 NIHSS score 14: Secondary | ICD-10-CM

## 2023-10-09 DIAGNOSIS — E669 Obesity, unspecified: Secondary | ICD-10-CM | POA: Diagnosis present

## 2023-10-09 DIAGNOSIS — J9612 Chronic respiratory failure with hypercapnia: Secondary | ICD-10-CM | POA: Diagnosis not present

## 2023-10-09 DIAGNOSIS — I130 Hypertensive heart and chronic kidney disease with heart failure and stage 1 through stage 4 chronic kidney disease, or unspecified chronic kidney disease: Secondary | ICD-10-CM | POA: Diagnosis present

## 2023-10-09 DIAGNOSIS — I480 Paroxysmal atrial fibrillation: Secondary | ICD-10-CM | POA: Diagnosis present

## 2023-10-09 DIAGNOSIS — I6789 Other cerebrovascular disease: Secondary | ICD-10-CM | POA: Diagnosis present

## 2023-10-09 DIAGNOSIS — J9601 Acute respiratory failure with hypoxia: Secondary | ICD-10-CM | POA: Diagnosis not present

## 2023-10-09 DIAGNOSIS — E1122 Type 2 diabetes mellitus with diabetic chronic kidney disease: Secondary | ICD-10-CM | POA: Diagnosis present

## 2023-10-09 DIAGNOSIS — I420 Dilated cardiomyopathy: Secondary | ICD-10-CM | POA: Diagnosis present

## 2023-10-09 DIAGNOSIS — R6521 Severe sepsis with septic shock: Secondary | ICD-10-CM | POA: Diagnosis present

## 2023-10-09 DIAGNOSIS — N182 Chronic kidney disease, stage 2 (mild): Secondary | ICD-10-CM | POA: Diagnosis not present

## 2023-10-09 LAB — CBC
HCT: 44.2 % (ref 39.0–52.0)
Hemoglobin: 14 g/dL (ref 13.0–17.0)
MCH: 30.8 pg (ref 26.0–34.0)
MCHC: 31.7 g/dL (ref 30.0–36.0)
MCV: 97.4 fL (ref 80.0–100.0)
Platelets: 171 K/uL (ref 150–400)
RBC: 4.54 MIL/uL (ref 4.22–5.81)
RDW: 13.2 % (ref 11.5–15.5)
WBC: 10.6 K/uL — ABNORMAL HIGH (ref 4.0–10.5)
nRBC: 0 % (ref 0.0–0.2)

## 2023-10-09 LAB — ECHOCARDIOGRAM COMPLETE BUBBLE STUDY
Area-P 1/2: 2.6 cm2
Calc EF: 51.9 %
S' Lateral: 2.5 cm
Single Plane A2C EF: 40.9 %
Single Plane A4C EF: 57 %

## 2023-10-09 LAB — URINALYSIS, ROUTINE W REFLEX MICROSCOPIC
Bilirubin Urine: NEGATIVE
Glucose, UA: 150 mg/dL — AB
Ketones, ur: NEGATIVE mg/dL
Nitrite: NEGATIVE
Protein, ur: 100 mg/dL — AB
Specific Gravity, Urine: 1.02 (ref 1.005–1.030)
pH: 5 (ref 5.0–8.0)

## 2023-10-09 LAB — COMPREHENSIVE METABOLIC PANEL WITH GFR
ALT: 46 U/L — ABNORMAL HIGH (ref 0–44)
AST: 174 U/L — ABNORMAL HIGH (ref 15–41)
Albumin: 2.5 g/dL — ABNORMAL LOW (ref 3.5–5.0)
Alkaline Phosphatase: 58 U/L (ref 38–126)
Anion gap: 12 (ref 5–15)
BUN: 33 mg/dL — ABNORMAL HIGH (ref 8–23)
CO2: 19 mmol/L — ABNORMAL LOW (ref 22–32)
Calcium: 8.1 mg/dL — ABNORMAL LOW (ref 8.9–10.3)
Chloride: 107 mmol/L (ref 98–111)
Creatinine, Ser: 2.39 mg/dL — ABNORMAL HIGH (ref 0.61–1.24)
GFR, Estimated: 26 mL/min — ABNORMAL LOW (ref >60)
Glucose, Bld: 189 mg/dL — ABNORMAL HIGH (ref 70–99)
Potassium: 3.9 mmol/L (ref 3.5–5.1)
Sodium: 138 mmol/L (ref 135–145)
Total Bilirubin: 1.4 mg/dL — ABNORMAL HIGH (ref 0.0–1.2)
Total Protein: 5.3 g/dL — ABNORMAL LOW (ref 6.5–8.1)

## 2023-10-09 LAB — DIFFERENTIAL
Abs Immature Granulocytes: 0.02 K/uL (ref 0.00–0.07)
Basophils Absolute: 0 K/uL (ref 0.0–0.1)
Basophils Relative: 0 %
Eosinophils Absolute: 0 K/uL (ref 0.0–0.5)
Eosinophils Relative: 0 %
Immature Granulocytes: 0 %
Lymphocytes Relative: 5 %
Lymphs Abs: 0.5 K/uL — ABNORMAL LOW (ref 0.7–4.0)
Monocytes Absolute: 0.8 K/uL (ref 0.1–1.0)
Monocytes Relative: 7 %
Neutro Abs: 9.3 K/uL — ABNORMAL HIGH (ref 1.7–7.7)
Neutrophils Relative %: 88 %

## 2023-10-09 LAB — CK: Total CK: >20000 U/L — ABNORMAL HIGH (ref 49–397)

## 2023-10-09 LAB — MRSA NEXT GEN BY PCR, NASAL: MRSA by PCR Next Gen: NOT DETECTED

## 2023-10-09 LAB — HEMOGLOBIN A1C
Hgb A1c MFr Bld: 6.4 % — ABNORMAL HIGH (ref 4.8–5.6)
Mean Plasma Glucose: 137 mg/dL

## 2023-10-09 LAB — ETHANOL: Alcohol, Ethyl (B): <15 mg/dL (ref <15)

## 2023-10-09 LAB — BRAIN NATRIURETIC PEPTIDE: B Natriuretic Peptide: 147.8 pg/mL — ABNORMAL HIGH (ref 0.0–100.0)

## 2023-10-09 LAB — PROTIME-INR
INR: 1.4 — ABNORMAL HIGH (ref 0.8–1.2)
Prothrombin Time: 18.2 s — ABNORMAL HIGH (ref 11.4–15.2)

## 2023-10-09 LAB — APTT: aPTT: 27 s (ref 24–36)

## 2023-10-09 LAB — I-STAT CG4 LACTIC ACID, ED: Lactic Acid, Venous: 4.6 mmol/L (ref 0.5–1.9)

## 2023-10-09 LAB — LIPID PANEL
Cholesterol: 69 mg/dL (ref 0–200)
HDL: 33 mg/dL — ABNORMAL LOW (ref >40)
LDL Cholesterol: 13 mg/dL (ref 0–99)
Total CHOL/HDL Ratio: 2.1 ratio
Triglycerides: 113 mg/dL (ref <150)
VLDL: 23 mg/dL (ref 0–40)

## 2023-10-09 LAB — LACTIC ACID, PLASMA
Lactic Acid, Venous: 4.9 mmol/L (ref 0.5–1.9)
Lactic Acid, Venous: >9 mmol/L (ref 0.5–1.9)

## 2023-10-09 LAB — PROCALCITONIN: Procalcitonin: 2.84 ng/mL

## 2023-10-09 MED ORDER — PIPERACILLIN-TAZOBACTAM 3.375 G IVPB 30 MIN
3.3750 g | Freq: Once | INTRAVENOUS | Status: AC
  Administered 2023-10-09: 3.375 g via INTRAVENOUS
  Filled 2023-10-09: qty 50

## 2023-10-09 MED ORDER — ROSUVASTATIN CALCIUM 20 MG PO TABS
40.0000 mg | ORAL_TABLET | Freq: Every day | ORAL | Status: DC
  Administered 2023-10-09: 40 mg via ORAL
  Filled 2023-10-09: qty 2

## 2023-10-09 MED ORDER — VANCOMYCIN VARIABLE DOSE PER UNSTABLE RENAL FUNCTION (PHARMACIST DOSING): Status: DC

## 2023-10-09 MED ORDER — APIXABAN 2.5 MG PO TABS
2.5000 mg | ORAL_TABLET | Freq: Two times a day (BID) | ORAL | Status: DC
  Administered 2023-10-09: 2.5 mg via ORAL
  Filled 2023-10-09: qty 1

## 2023-10-09 MED ORDER — PIPERACILLIN-TAZOBACTAM 3.375 G IVPB
3.3750 g | Freq: Three times a day (TID) | INTRAVENOUS | Status: DC
  Administered 2023-10-09 – 2023-10-10 (×2): 3.375 g via INTRAVENOUS
  Filled 2023-10-09 (×4): qty 50

## 2023-10-09 MED ORDER — LEVETIRACETAM 750 MG PO TABS
750.0000 mg | ORAL_TABLET | Freq: Two times a day (BID) | ORAL | Status: DC
  Administered 2023-10-09 (×2): 750 mg via ORAL
  Filled 2023-10-09 (×2): qty 1

## 2023-10-09 MED ORDER — PERFLUTREN LIPID MICROSPHERE
1.0000 mL | INTRAVENOUS | Status: AC | PRN
  Administered 2023-10-09: 2 mL via INTRAVENOUS

## 2023-10-09 MED ORDER — APIXABAN 2.5 MG PO TABS: 2.5000 mg | ORAL_TABLET | Freq: Two times a day (BID) | ORAL | Status: DC

## 2023-10-09 MED ORDER — VANCOMYCIN HCL 2000 MG/400ML IV SOLN
2000.0000 mg | Freq: Once | INTRAVENOUS | Status: AC
  Administered 2023-10-09: 2000 mg via INTRAVENOUS
  Filled 2023-10-09: qty 400

## 2023-10-09 MED ORDER — LACTATED RINGERS IV BOLUS
1000.0000 mL | Freq: Once | INTRAVENOUS | Status: AC
  Administered 2023-10-09: 1000 mL via INTRAVENOUS

## 2023-10-09 MED ORDER — ASPIRIN 325 MG PO TABS
325.0000 mg | ORAL_TABLET | Freq: Every day | ORAL | Status: DC
  Administered 2023-10-09: 325 mg via ORAL
  Filled 2023-10-09: qty 1

## 2023-10-09 MED ORDER — STROKE: EARLY STAGES OF RECOVERY BOOK
Freq: Once | Status: AC
  Filled 2023-10-09: qty 1

## 2023-10-09 MED ORDER — SODIUM CHLORIDE 0.9 % IV SOLN: INTRAVENOUS | Status: DC

## 2023-10-09 NOTE — Evaluation (Signed)
 Physical Therapy Evaluation Patient Details Name: Edwin Wall MRN: [id number] DOB: 10/18/1940 Today's Date: 10/09/2023  History of Present Illness  83 yo male adm 10/08/23 with Rt weakness after trying to help wife off the ground and he suddenly felt weak and fell on her, on ground 8 hrs with wife . MRI negative. PMH: Lt MCA CVA with Rt hemiparesis, HFeEF, Afib, HLD, seizures, anxiety, colon CA  Clinical Impression  Pt mildly confused re: time July and requires significant assist for mobility at this time.  Of particular interest, he has little to no movement of his right hand (and reports this is not his baseline) as well as significantly decreased sensation.  He is very sore and swollen in his left leg and weak all over.  He requires two person total assist for bed mobility alone.  He will require significant post acute rehab to help him return to his PLOF (modified independent).   PT to follow acutely for deficits listed below.       If plan is discharge home, recommend the following: Two people to help with walking and/or transfers;Two people to help with bathing/dressing/bathroom;Direct supervision/assist for medications management;Direct supervision/assist for financial management;Assist for transportation;Help with stairs or ramp for entrance;Supervision due to cognitive status;Assistance with feeding;Assistance with cooking/housework   Can travel by private vehicle   No    Equipment Recommendations Wheelchair (measurements PT);Wheelchair cushion (measurements PT);Hospital bed;Hoyer lift  Recommendations for Other Services       Functional Status Assessment Patient has had a recent decline in their functional status and demonstrates the ability to make significant improvements in function in a reasonable and predictable amount of time.     Precautions / Restrictions Precautions Precautions: Fall Precaution/Restrictions Comments: R UE very weak      Mobility  Bed  Mobility Overal bed mobility: Needs Assistance Bed Mobility: Rolling Rolling: +2 for physical assistance, Total assist         General bed mobility comments: Two person total assit to roll bil, pt able to help <10% by holding on with his left hand to right rail when rolling right, no assist at all rolling left.    Transfers                   General transfer comment: Unable with only one person assit    Ambulation/Gait                  Stairs            Wheelchair Mobility     Tilt Bed    Modified Rankin (Stroke Patients Only) Modified Rankin (Stroke Patients Only) Pre-Morbid Rankin Score: No symptoms Modified Rankin: Severe disability     Balance                                             Pertinent Vitals/Pain Pain Assessment Pain Assessment: Faces Faces Pain Scale: Hurts even more Pain Location: left leg and arm (he must of been on his left side on the ground Pain Descriptors / Indicators: Grimacing, Guarding Pain Intervention(s): Limited activity within patient's tolerance, Monitored during session, Repositioned    Home Living Family/patient expects to be discharged to:: Private residence Living Arrangements: Spouse/significant other;Children (33 y.o. son) Available Help at Discharge: Family;Available 24 hours/day Type of Home: House Home Access: Stairs to enter Entrance Stairs-Rails: None Entrance [employer]  of Steps: 2   Home Layout: One level Home Equipment: Agricultural consultant (2 wheels);Cane - single point;Shower seat - built in      Prior Function Prior Level of Function : Independent/Modified Independent             Mobility Comments: pt report he has a cane, but never used it, independent bathing, dressing, still drives, retired       Extremity/Trunk Assessment   Upper Extremity Assessment Upper Extremity Assessment: Defer to OT evaluation (very weak R UE, also impaired sensation)    Lower  Extremity Assessment Lower Extremity Assessment: Generalized weakness;RLE deficits/detail;LLE deficits/detail RLE Deficits / Details: Pt unable to lift eaither leg against gravity in bed, ankle 2/5, knee 2/5, hip 2/5 RLE Sensation: decreased light touch LLE Deficits / Details: left leg ankle trace, knee trace, hip trace, limited by pain LLE: Unable to fully assess due to pain LLE Sensation: decreased light touch       Communication   Communication Communication: No apparent difficulties    Cognition Arousal: Alert Behavior During Therapy: WFL for tasks assessed/performed   PT - Cognitive impairments: Orientation, Memory   Orientation impairments: Time (June)                   PT - Cognition Comments: Pt able to tell me what happened, did not know they were ruling out stroke, was no close on time orientation Following commands: Intact       Cueing       General Comments General comments (skin integrity, edema, etc.): Pt incontient of urine and bowel, RN tech assisting in pericare/clean up, L leg is swollen with blistering both open and closed blisters    Exercises     Assessment/Plan    PT Assessment Patient needs continued PT services  PT Problem List Decreased balance;Decreased mobility;Decreased cognition;Decreased knowledge of use of DME;Decreased safety awareness;Impaired sensation;Decreased skin integrity;Obesity;Pain       PT Treatment Interventions DME instruction;Gait training;Stair training;Functional mobility training;Therapeutic activities;Therapeutic exercise;Balance training;Neuromuscular re-education;Patient/family education;Modalities;Cognitive remediation    PT Goals (Current goals can be found in the Care Plan section)  Acute Rehab PT Goals Patient Stated Goal: patient wants to go home PT Goal Formulation: With patient Time For Goal Achievement: 10/23/23 Potential to Achieve Goals: Good    Frequency Min 1X/week     Co-evaluation                AM-PAC PT 6 Clicks Mobility  Outcome Measure Help needed turning from your back to your side while in a flat bed without using bedrails?: Total Help needed moving from lying on your back to sitting on the side of a flat bed without using bedrails?: Total Help needed moving to and from a bed to a chair (including a wheelchair)?: Total Help needed standing up from a chair using your arms (e.g., wheelchair or bedside chair)?: Total Help needed to walk in hospital room?: Total Help needed climbing 3-5 steps with a railing? : Total 6 Click Score: 6    End of Session   Activity Tolerance: Patient limited by pain Patient left: in bed;with call bell/phone within reach   PT Visit Diagnosis: Muscle weakness (generalized) (M62.81);Difficulty in walking, not elsewhere classified (R26.2);Pain Pain - Right/Left: Left Pain - part of body: Knee    Time: 8498-8460 PT Time Calculation (min) (ACUTE ONLY): 38 min   Charges:   PT Evaluation $PT Eval Moderate Complexity: 1 Mod PT Treatments $Therapeutic Activity: 23-37 mins PT General Charges $$  ACUTE PT VISIT: 1 Visit       Asberry HERO, PT, DPT  Acute Rehabilitation Secure chat is best for contact #[phone] office    10/09/2023, 3:50 PM

## 2023-10-09 NOTE — Plan of Care (Signed)
  Problem: Education: Goal: Knowledge of disease or condition will improve Outcome: Progressing   Problem: Coping: Goal: Will verbalize positive feelings about self Outcome: Progressing Goal: Will identify appropriate support needs Outcome: Progressing   Problem: Self-Care: Goal: Ability to communicate needs accurately will improve Outcome: Progressing   Problem: Education: Goal: Knowledge of General Education information will improve Description: Including pain rating scale, medication(s)/side effects and non-pharmacologic comfort measures Outcome: Progressing   Problem: Clinical Measurements: Goal: Respiratory complications will improve Outcome: Progressing Goal: Cardiovascular complication will be avoided Outcome: Progressing   Problem: Skin Integrity: Goal: Risk for impaired skin integrity will decrease Outcome: Progressing

## 2023-10-09 NOTE — Progress Notes (Signed)
 Elink following for sepsis protocol.

## 2023-10-09 NOTE — Plan of Care (Signed)
  Problem: Education: Goal: Knowledge of disease or condition will improve Outcome: Progressing Goal: Knowledge of secondary prevention will improve (MUST DOCUMENT ALL) Outcome: Progressing Goal: Knowledge of patient specific risk factors will improve (DELETE if not current risk factor) Outcome: Progressing   Problem: Education: Goal: Knowledge of secondary prevention will improve (MUST DOCUMENT ALL) Outcome: Progressing

## 2023-10-09 NOTE — ED Provider Notes (Signed)
 No admission orders placed by hospitalist.  Repaged and discussed with Dr. Georgina who will admit the patient.   Yolande Lamar BROCKS, MD 10/09/23 [phone]

## 2023-10-09 NOTE — ED Notes (Signed)
 Patient transported to MRI

## 2023-10-09 NOTE — ED Notes (Signed)
 This RN notified MD of patients BP trending down and current MAP of 62

## 2023-10-09 NOTE — Progress Notes (Addendum)
 Pharmacy Antibiotic Note  Edwin Wall is a 83 y.o. male admitted on 10/08/2023 with sepsis.  Pharmacy has been consulted for vancomycin , zosyn  dosing.  98 kg in 08/2023 per care everywhere Crcl 31 ml/min using old weight   Plan: Vancomycin  2g IV x1 then variable dosing given significant AKI this AM Goal trough 15-21mcg/mL, Goal AUC 400-600 -F/u renal function, LOT, and culture data -F/u vancomycin  levels PRN per protocol Zosyn  3.375g IV q8h F/u renal func, LOT, and cultures     Temp (24hrs), Avg:96.7 F (35.9 C), Min:96 F (35.6 C), Max:97.4 F (36.3 C)  Recent Labs  Lab 10/08/23 2343 10/08/23 2346 10/09/23 0537 10/09/23 0845  WBC 10.6*  --   --   --   CREATININE 2.39* 2.10*  --   --   LATICACIDVEN  --   --  4.9* >9.0*    CrCl cannot be calculated (Unknown ideal weight.).    No Known Allergies  Antimicrobials this admission: Vanc 8/8> Zosyn  8/8>  Dose adjustments this admission:   Microbiology results: 8/8 BCX 8/8 UA>UCX   Thank you for allowing pharmacy to be a part of this patient's care.  Sharyne Glatter, PharmD, BCCCP Critical Care Clinical Pharmacist 10/09/2023 9:48 AM

## 2023-10-09 NOTE — Progress Notes (Signed)
 EEG complete - results pending

## 2023-10-09 NOTE — ED Notes (Signed)
 Daughter Flossie is leaving. Contact #- [phone]

## 2023-10-09 NOTE — Consult Note (Signed)
 NEUROLOGY CONSULT NOTE   Date of service: October 09, 2023 Patient Name: Edwin Wall MRN:  [id number] DOB:  [birth year] Chief Complaint: Right-sided weakness Requesting Provider: Jerral Meth, MD  History of Present Illness  Edwin Wall is a 83 y.o. male with hx of previous large left MCA stroke who was in his normal state of health around 2 PM.  At that point, he went to check on his wife and when he did so, he fell on top of her.  He states that he thinks that he fell because his foot got twisted.  He states that normally he is able to get up and walk around, able to use his right arm.  His last neuroexam from May 2024 indicates he had 5/5 strength.  He states that he was ambidextrous, but right with his right hand growing up.  LKW: 2 PM Modified rankin score: Two IV Thrombolysis: No, outside window EVT: No LVO   NIHSS components Score: Comment  1a Level of Conscious 0[x]  1[]  2[]  3[]      1b LOC Questions 0[]  1[]  2[x]       1c LOC Commands 0[x]  1[]  2[]       2 Best Gaze 0[x]  1[]  2[]       3 Visual 0[x]  1[]  2[]  3[]      4 Facial Palsy 0[]  1[x]  2[]  3[]      5a Motor Arm - left 0[x]  1[]  2[]  3[]  4[]  UN[]    5b Motor Arm - Right 0[]  1[]  2[]  3[]  4[x]  UN[]    6a Motor Leg - Left 0[]  1[]  2[]  3[x]  4[]  UN[]    6b Motor Leg - Right 0[]  1[]  2[]  3[x]  4[]  UN[]    7 Limb Ataxia 0[x]  1[]  2[]  UN[]      8 Sensory 0[]  1[x]  2[]  UN[]      9 Best Language 0[x]  1[]  2[]  3[]      10 Dysarthria 0[x]  1[]  2[]  UN[]      11 Extinct. and Inattention 0[x]  1[]  2[]       TOTAL: 14       Past History   Past Medical History:  Diagnosis Date   Anxiety    Arthritis    Bilateral leg edema    Cataract    Colon cancer (HCC) dx'd 11/2016   stage 3   Gout    Gout    Hemorrhoids    History of kidney stones    passed it   Hypertension    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HCC)    EF 45-50% by echo 10/22   PAF (paroxysmal atrial fibrillation) (HCC)    Pre-diabetes     Past Surgical  History:  Procedure Laterality Date   CARDIOVERSION [name] 04/23/2021   Procedure: CARDIOVERSION;  Surgeon: Kate Lonni CROME, MD;  Location: [facility] ENDOSCOPY;  Service: Cardiovascular;  Laterality: [name];   COLECTOMY  12/17/2016   lap; partial sigmoid colectomy/notes 12/17/2016   COLONOSCOPY W/ BIOPSIES AND POLYPECTOMY  11/11/2016   LAPAROSCOPIC SIGMOID COLECTOMY [name] 12/17/2016   Procedure: LAPAROSCOPIC SIGMOID COLECTOMY;  Surgeon: Aron Shoulders, MD;  Location: [facility] OR;  Service: General;  Laterality: [name];   LEFT HEART CATH AND CORONARY ANGIOGRAPHY [name] 03/11/2021   Procedure: LEFT HEART CATH AND CORONARY ANGIOGRAPHY;  Surgeon: Mady Lonni, MD;  Location: [facility] INVASIVE CV LAB;  Service: Cardiovascular;  Laterality: [name];   NASAL SEPTUM SURGERY     TONSILLECTOMY      Family History: Family History  Problem Relation Age of Onset   Cancer Mother        LUNG  COPD Father     Social History  reports that he has never smoked. He has never used smokeless tobacco. He reports that he does not drink alcohol and does not use drugs.  No Known Allergies  Medications  No current facility-administered medications for this encounter.  Current Outpatient Medications:    apixaban  (ELIQUIS ) 2.5 MG TABS tablet, Take 1 tablet (2.5 mg total) by mouth 2 (two) times daily., Disp: 180 tablet, Rfl: 3   colestipol  (COLESTID ) 1 g tablet, Take 1 g by mouth daily., Disp: , Rfl:    diclofenac  Sodium (VOLTAREN ) 1 % GEL, Apply 2 g topically 4 (four) times daily as needed (aches)., Disp: , Rfl:    ezetimibe  (ZETIA ) 10 MG tablet, TAKE 1 TABLET(10 MG) BY MOUTH DAILY, Disp: 90 tablet, Rfl: 3   furosemide  (LASIX ) 20 MG tablet, TAKE 1 TABLET(20 MG) BY MOUTH DAILY, Disp: 90 tablet, Rfl: 3   levETIRAcetam  (KEPPRA ) 750 MG tablet, TAKE 1 TABLET(750 MG) BY MOUTH TWICE DAILY, Disp: 60 tablet, Rfl: 5   liver oil-zinc oxide (DESITIN) 40 % ointment, Apply 1 application  topically as needed for irritation., Disp: , Rfl:     neomycin-bacitracin-polymyxin (NEOSPORIN) ointment, Apply 1 application  topically as needed for wound care., Disp: , Rfl:    potassium chloride  (KLOR-CON  M) 10 MEQ tablet, Take 10 mEq by mouth daily., Disp: , Rfl:    rosuvastatin  (CRESTOR ) 40 MG tablet, Take 1 tablet (40 mg total) by mouth daily., Disp: 30 tablet, Rfl: 0  Vitals   Vitals:   Oct 19, 2023 2330 10/09/23 0000 10/09/23 0030 10/09/23 0100  BP: 137/66 93/69 (!) 85/66 (!) 79/56  Pulse: 64 81 79 83  Resp: 18 (!) 23 (!) 23 (!) 25  Temp:      TempSrc:      SpO2: 100% 100% 100% 100%    There is no height or weight on file to calculate BMI.   Physical Exam   Constitutional: Appears well-developed and well-nourished.  Neurologic Examination    Neuro: Mental Status: Patient is awake, alert, oriented to person, gives the month as April or may, gives the year as 23.  He does not appear to recognize how bad his right-sided deficits are. Cranial Nerves: II: Visual Fields are full. Pupils are equal, round, and reactive to light.   III,IV, VI: EOMI without ptosis or diploplia.  V: Facial sensation is symmetric to temperature VII: Facial movement with mild weakness on the right Motor: He has severe right sided arm weakness, with no movement.  He has severe right leg weakness but is able to wiggle his toes he will not lift his left leg either, but states that is due to knee pain. Sensory: Sensation is reduced on the right Cerebellar: No ataxia in the left arm         Labs/Imaging/Neurodiagnostic studies   CBC:  Recent Labs  Lab 10-19-2023 2343 19-Oct-2023 2346  WBC 10.6*  --   NEUTROABS 9.3*  --   HGB 14.0 14.6  HCT 44.2 43.0  MCV 97.4  --   PLT 171  --    Basic Metabolic Panel:  Lab Results  Component Value Date   NA 140 10-19-2023   K 3.8 Oct 19, 2023   CO2 19 (L) 10/19/23   GLUCOSE 172 (H) 10-19-2023   BUN 37 (H) 19-Oct-2023   CREATININE 2.10 (H) 10/19/23   CALCIUM  8.1 (L) 2023/10/19   GFRNONAA 26 (L)  10-19-23   GFRAA 52 (L) 12/20/2016   Lipid Panel:  Lab Results  Component Value Date   LDLCALC 29 07/20/2023   HgbA1c:  Lab Results  Component Value Date   HGBA1C 6.2 (H) 12/23/2020   Urine Drug Screen:     Component Value Date/Time   LABOPIA NONE DETECTED 04/14/2021 1554   COCAINSCRNUR NONE DETECTED 04/14/2021 1554   LABBENZ NONE DETECTED 04/14/2021 1554   AMPHETMU NONE DETECTED 04/14/2021 1554   THCU NONE DETECTED 04/14/2021 1554   LABBARB NONE DETECTED 04/14/2021 1554    Alcohol Level     Component Value Date/Time   [facility] <15 10/08/2023 2343   INR  Lab Results  Component Value Date   INR 1.4 (H) 10/08/2023   APTT  Lab Results  Component Value Date   APTT 27 10/08/2023    CT Head without contrast(Personally reviewed): Stable large left MCA territory stroke  CT angio Head and Neck with contrast(Personally reviewed): No acute findings  ASSESSMENT   Edwin Wall is a 83 y.o. male with severe right-sided weakness and apparent right sided neglect.  Given the relatively sudden onset and persistent deficits, I do suspect that he has had a recurrent ischemic stroke.  He has a history of atrial fibrillation and is not on anticoagulation due to concerns about falls.  He will need an MRI to confirm this.  Recrudescence could be possible, but his symptoms seem fairly severe for this.  RECOMMENDATIONS  - HgbA1c, fasting lipid panel - MRI of the brain without contrast - Frequent neuro checks - Echocardiogram - CTA head and neck - Prophylactic therapy-Antiplatelet med: Aspirin  - dose 325 mg daily - Risk factor modification - Telemetry monitoring - PT consult, OT consult, Speech consult - Stroke team to follow  ______________________________________________________________________    Signed, Aisha Seals, MD [facility]

## 2023-10-09 NOTE — Progress Notes (Addendum)
 STROKE TEAM PROGRESS NOTE   INTERIM HISTORY/SUBJECTIVE RN at bedside.  Patient lying bed, lethargic, stated that he woke up at 2 AM found  his wife fell to the ground, he tried to pull his wife up but he fell too, not able to get up, found to have acute right-sided weakness.  Prior to the fall, he stated that he has no any other symptoms and at his baseline.  At baseline, he was able to walk without a cane.  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  Denies head trauma or hitting head during the fall.  Denies neck pain or chest pain.   OBJECTIVE  CBC    Component Value Date/Time   WBC 10.6 (H) 10/08/2023 2343   RBC 4.54 10/08/2023 2343   HGB 14.6 10/08/2023 2346   HGB 13.5 07/20/2023 1550   HCT 43.0 10/08/2023 2346   HCT 42.2 07/20/2023 1550   PLT 171 10/08/2023 2343   PLT 152 07/20/2023 1550   MCV 97.4 10/08/2023 2343   MCV 96 07/20/2023 1550   MCH 30.8 10/08/2023 2343   MCHC 31.7 10/08/2023 2343   RDW 13.2 10/08/2023 2343   RDW 12.0 07/20/2023 1550   LYMPHSABS 0.5 (L) 10/08/2023 2343   LYMPHSABS 1.6 07/20/2023 1550   MONOABS 0.8 10/08/2023 2343   EOSABS 0.0 10/08/2023 2343   EOSABS 0.3 07/20/2023 1550   BASOSABS 0.0 10/08/2023 2343   BASOSABS 0.1 07/20/2023 1550    BMET    Component Value Date/Time   NA 140 10/08/2023 2346   NA 144 07/20/2023 1550   K 3.8 10/08/2023 2346   CL 105 10/08/2023 2346   CO2 19 (L) 10/08/2023 2343   GLUCOSE 172 (H) 10/08/2023 2346   BUN 37 (H) 10/08/2023 2346   BUN 21 07/20/2023 1550   CREATININE 2.10 (H) 10/08/2023 2346   CREATININE 1.49 (H) 01/15/2011 1057   CALCIUM  8.1 (L) 10/08/2023 2343   EGFR 45 (L) 07/20/2023 1550   GFRNONAA 26 (L) 10/08/2023 2343    IMAGING past 24 hours EEG adult Result Date: 10/09/2023 Shelton Arlin KIDD, MD     10/09/2023 10:45 AM Patient Name: Edwin Wall MRN: [id number] Epilepsy Attending: Arlin KIDD Shelton Referring Physician/Provider:  Georgina Basket, MD Date: 10/09/2023 Duration: 23.41 mins Patient history:  83 y.o. male with severe right-sided weakness and apparent right sided neglect. EEG to evaluate for seizure. Level of alertness: Awake AEDs during EEG study: LEV Technical aspects: This EEG study was done with scalp electrodes positioned according to the 10-20 International system of electrode placement. Electrical activity was reviewed with band pass filter of 1-70Hz , sensitivity of 7 uV/mm, display speed of 37mm/sec with a 60Hz  notched filter applied as appropriate. EEG data were recorded continuously and digitally stored.  Video monitoring was available and reviewed as appropriate. Description: The posterior dominant rhythm consists of 8 Hz activity of moderate voltage (25-35 uV) seen predominantly in posterior head regions, symmetric and reactive to eye opening and eye closing. EEG showed intermittent generalized 3 to 6 Hz theta-delta slowing. Hyperventilation and photic stimulation were not performed.   ABNORMALITY - Intermittent slow, generalized IMPRESSION: This study is suggestive of mild diffuse encephalopathy. No seizures or epileptiform discharges were seen throughout the recording. Arlin KIDD Shelton   MR BRAIN WO CONTRAST Result Date: 10/09/2023 EXAM: MRI BRAIN WITHOUT CONTRAST 04/14/2021 TECHNIQUE: Multiplanar multisequence MRI of the head/brain was performed without the administration of intravenous contrast. COMPARISON: CT  head without contrast and CT angio head and neck and CT perfusion 10/08/23. CLINICAL HISTORY: FINDINGS: BRAIN AND VENTRICLES: No acute infarct. No intracranial hemorrhage. No mass. No midline shift. No hydrocephalus. The sella is unremarkable. Normal flow voids. Chronic encephalomalacia of the anterior left frontal lobe and insular cortex is stable. Ex vacuo dilation of the left lateral ventricle is again noted. 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 Remote  infarct is present in the right superior temporal gyrus. ORBITS: No acute abnormality. SINUSES AND MASTOIDS: Bilateral mastoid effusions are present. BONES AND SOFT TISSUES: Normal bone marrow signal. No acute soft tissue abnormality. IMPRESSION: 1. 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 2. 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 3. Bilateral mastoid effusions. Electronically signed by: Lonni Necessary MD 10/09/2023 05:14 AM EDT RP Workstation: [id number]   CT ANGIO HEAD NECK W WO CM W PERF (CODE STROKE) Result Date: 10/08/2023 CLINICAL DATA:  Neuro deficit, acute, stroke suspected EXAM: CT ANGIOGRAPHY HEAD AND NECK CT PERFUSION BRAIN TECHNIQUE: Multidetector CT imaging of the head and neck was performed using the standard protocol during bolus administration of intravenous contrast. Multiplanar CT image reconstructions and MIPs were obtained to evaluate the vascular anatomy. Carotid stenosis measurements (when applicable) are obtained utilizing NASCET criteria, using the distal internal carotid diameter as the denominator. RADIATION DOSE REDUCTION: This exam was performed according to the departmental dose-optimization program which includes automated exposure control, adjustment of the mA and/or kV according to patient size and/or use of iterative reconstruction technique. CONTRAST:  OMNIPAQUE  IOHEXOL  350 MG/ML SOLN COMPARISON:  None Available. FINDINGS: CTA NECK FINDINGS Aortic arch: Great vessel origins are patent without significant stenosis. Right carotid system: Atherosclerosis of the distal common carotid artery without greater than 50% stenosis. Left carotid system: No evidence of dissection, stenosis (50% or greater) or occlusion. Vertebral arteries: Codominant. No evidence of dissection, stenosis  (50% or greater) or occlusion. Skeleton: No acute fracture. Other neck: No acute abnormality on limited assessment. Upper chest: Lung apices are clear. Review of the MIP images confirms the above findings CTA HEAD FINDINGS Anterior circulation: Bilateral intracranial ICAs, MCAs and ACAs are patent without proximal hemodynamically significant stenosis. Posterior circulation: Bilateral intradural vertebral arteries, basilar artery and bilateral posterior legs are patent. Moderate left P2 PCA stenosis. Venous sinuses: Not well assessed due to arterial contrast bolus timing. Review of the MIP images confirms the above findings IMPRESSION: 1. No emergent large vessel occlusion. 2. Moderate left P2 PCA stenosis. Electronically Signed   By: Gilmore GORMAN Molt M.D.   On: 10/08/2023 23:51   CT HEAD CODE STROKE WO CONTRAST Result Date: 10/08/2023 CLINICAL DATA:  Code stroke.  Neuro deficit, acute, stroke suspected EXAM: CT HEAD WITHOUT CONTRAST TECHNIQUE: Contiguous axial images were obtained from the base of the skull through the vertex without intravenous contrast. RADIATION DOSE REDUCTION: This exam was performed according to the departmental dose-optimization program which includes automated exposure control, adjustment of the mA and/or kV according to patient size and/or use of iterative reconstruction technique. COMPARISON:  CT head 03/05/2022. FINDINGS: Brain: Remote left frontal and insular infarct. No evidence of acute large vascular territory infarct, acute hemorrhage, mass lesion or midline shift. No hydrocephalus. Vascular: No hyperdense vessel identified. Calcific atherosclerosis. Skull: No acute fracture. Sinuses/Orbits: No acute findings. Other: No mastoid effusions. ASPECTS [facility] Stroke Program  Early CT Score) Total score (0-10 with 10 being normal): 10. IMPRESSION: 1. No evidence of acute intracranial abnormality. ASPECTS is 10. 2. Remote left frontal and insular infarct. Code stroke imaging results were  communicated on 10/08/2023 at 11:30 pm to provider Dr. Michaela via secure text paging. Electronically Signed   By: Gilmore GORMAN Molt M.D.   On: 10/08/2023 23:31    Vitals:   10/09/23 0900 10/09/23 1000 10/09/23 1141 10/09/23 1544  BP: 98/66 93/66 99/66  (!) 85/55  Pulse: 87 90 82 67  Resp: 12 17 18 18   Temp:  98.3 F (36.8 C) 97.8 F (36.6 C) 98.3 F (36.8 C)  TempSrc:  Axillary Axillary Axillary  SpO2: 100% 100% 100% 100%     PHYSICAL EXAM General:  Alert, well-nourished, well-developed patient in no acute distress Psych:  Mood and affect appropriate for situation CV: Regular rate and rhythm on monitor Respiratory:  Regular, unlabored respirations on room air GI: Abdomen soft and nontender Skin: Pale and cool   NEURO:  Mental Status: AA&O to self, age stated 55, stated month was April or May, did state the appropriate place which was [employer].  Unable to state the current year Speech/Language: speech is without  aphasia.  Mild dysarthria naming, repetition, fluency, and comprehension intact.  Cranial Nerves:  II: PERRL. Visual fields full.  III, IV, VI: EOMI. Eyelids elevate symmetrically.  V: Sensation is intact to light touch and symmetrical to face.  VII: Subtle right facial droop VIII: hearing intact to voice. IX, X: Palate elevates symmetrically. Phonation is normal.  KP:Dynloizm shrug 5/5. XII: tongue is midline without fasciculations. Motor: Right arm is flaccid, can wiggle toes on right foot and can laterally rotate right leg, unable to lift off the bed, left leg unable to lift off the bed, left arm 5/5 Tone: is normal and bulk is normal Sensation- Intact to light touch bilaterally. Extinction absent to light touch to DSS.   Coordination: FTN intact bilaterally, HKS: no ataxia in BLE.No drift.  Gait- deferred  Most Recent NIH  1a Level of Conscious.:  1b LOC Questions: 1 1c LOC Commands:  2 Best Gaze:  3 Visual:  4 Facial Palsy:  5a Motor Arm - left:   5b Motor Arm - Right: 4 6a Motor Leg - Left: 3 6b Motor Leg - Right: 3 7 Limb Ataxia:  8 Sensory:  9 Best Language:  10 Dysarthria: 1 11 Extinct. and Inatten.:  TOTAL: 12   ASSESSMENT/PLAN  Edwin Wall is a 83 y.o. male with history of prior left MCA stroke paroxysmal A-fib on Eliquis , hypertension, CHF, stage III colon cancer, seizure disorder on home Keppra  presented to the emergency room after experiencing a fall and then having right arm weakness NIH on Admission 14  Acute Ischemic Infarct versus recrudescence of old left MCA infarct due to sepsis rhabdomyolysis Code Stroke  CT head No acute abnormality. ASPECTS 10.   Remote left frontal and insular infarct  CTA head & neck no LVO. Moderate left P2 PCA stenosis  MRI no acute process. 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 MR cervical spine ordered 2D Echo EF 40 to 45%, no PFO EEG intermittent generalized slowing, no seizures identified LDL 13 HgbA1c 6.2 VTE prophylaxis - SCDs Eliquis  (apixaban ) daily prior to admission, now on home Eliquis  2.5 mg twice daily Therapy recommendations: SNF  Disposition: Pending  Seizure disorder  History of seizure, follows with Dr. Georjean at [facility] On Keppra  750 twice daily PTA Keppra  resumed in the hospital EEG intermittent slowing, no seizures identified  Hx of Stroke  07/2020 admitted for left large MCA infarct.  CT head and neck left M2 occlusion, CTP 26/43 cc.  Found to have new diagnosed A-fib.  LDL 127, A1c 7.1.  Discharged on Eliquis  and Crestor  40. Per patient, at baseline, he was able to walk without a cane.  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  Atrial fibrillation Home Meds: Eliquis  Continue telemetry monitoring Resume  home Eliquis  2.5 mg twice daily  rhabdomyolysis  Lactic acidosis AKI on CKD 2 CK greater than 20,000 Lactic acid 4.9--4.6 Creatinine 2.39--2.10 Procalcitonin 2.84 Blood culture pending On IV fluid at 100 cc On Zosyn  and vancomycin   Hypertension CHF Nonischemic cardiomyopathy Home meds: Furosemide  20 mg BP stable This admission EF 40 to 45% Long-term BP goal normotensive  Hyperlipidemia Home meds: Zetia  10 mg and Crestor  40 mg  LDL 13, goal < 70 Elevated LFTs, AST/ALT 174/46 Consider to resume statin at a lower dose after LFT normalizes  Diabetes type II Controlled Home meds: None HgbA1c 6.2, goal < 7.0 CBGs SSI Recommend close follow-up with PCP for better DM control  Other Stroke Risk Factors Advanced age  Other Active Problems 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 Pt was seen and examined.   RN at bedside.  Patient reclining in bed, lethargic, however orientated to place, told me age 83 instead of 65, orientated to year but not months. No aphasia but paucity of speech, following all simple commands. Able to name and repeat with psychomotor slowing. No gaze palsy, tracking bilaterally, visual field full.  Mild right facial droop. Tongue midline. LUE no drift and LLE 3/5, right upper extremity not against gravity, right lower extremity not against gravity but able to wiggle toes slightly.  sensation not corporative, left FTN intact but slow, gait not tested.   Patient MRI negative but large left MCA encephalomalacia.  Patient symptoms concerning for new left MCA stroke versus recrudescence in the setting of severe sepsis/AKI/rhabdomyolysis.  However would like to rule out cervical myelopathy with MRI C-spine.  Continue home Eliquis , hold off statin given elevated LFTs sepsis management per primary team.   Will follow.  For detailed assessment and plan, please refer to above as I have made changes wherever appropriate.   Ary Cummins, MD PhD Stroke Neurology 10/09/2023 7:43 PM  I discussed with Dr. Georgina. I spent extensive total face-to-face time with the patient, reviewing test results, images and medication, and discussing the diagnosis, treatment plan and potential prognosis. This patient's care requiresreview of multiple databases, neurological assessment, discussion with family, other specialists and medical decision making of high complexity.      To contact Stroke Continuity provider, please refer to [email or web address]. After hours, contact General Neurology

## 2023-10-09 NOTE — Progress Notes (Addendum)
 OT Cancellation Note  Patient Details Name: SZYMON FOILES MRN: [id number] DOB: 1940-12-28   Cancelled Treatment:    Reason Eval/Treat Not Completed: Patient at procedure or test/ unavailable (EEG, OT to return fo revaluation when pt is available.)  Second attempt 1354: ECHO in room. OT to continue efforts.   Lucie JONETTA Kendall 10/09/2023, 10:05 AM

## 2023-10-09 NOTE — Procedures (Signed)
 Patient Name: Edwin Wall  MRN: [id number]  Epilepsy Attending: Arlin MALVA Krebs  Referring Physician/Provider: Georgina Basket, MD  Date: 10/09/2023 Duration: 23.41 mins  Patient history:  83 y.o. male with severe right-sided weakness and apparent right sided neglect. EEG to evaluate for seizure.  Level of alertness: Awake  AEDs during EEG study: LEV  Technical aspects: This EEG study was done with scalp electrodes positioned according to the 10-20 International system of electrode placement. Electrical activity was reviewed with band pass filter of 1-70Hz , sensitivity of 7 uV/mm, display speed of 55mm/sec with a 60Hz  notched filter applied as appropriate. EEG data were recorded continuously and digitally stored.  Video monitoring was available and reviewed as appropriate.  Description: The posterior dominant rhythm consists of 8 Hz activity of moderate voltage (25-35 uV) seen predominantly in posterior head regions, symmetric and reactive to eye opening and eye closing. EEG showed intermittent generalized 3 to 6 Hz theta-delta slowing. Hyperventilation and photic stimulation were not performed.     ABNORMALITY - Intermittent slow, generalized  IMPRESSION: This study is suggestive of mild diffuse encephalopathy. No seizures or epileptiform discharges were seen throughout the recording.  Nikoletta Varma O [place]yn[place]leigh [place]

## 2023-10-09 NOTE — Progress Notes (Signed)
 SLP Cancellation Note  Patient Details Name: Edwin Wall MRN: [id number] DOB: 02-18-1941   Cancelled treatment:       Reason Eval/Treat Not Completed: SLP screened. MRI is negative and pt denies changes related to his baseline cognitive-linguistic function (history of expressive aphasia). Discussed f/u at next venue of care if desired. No acute needs identified, will sign off.   Damien Blumenthal, M.A., CCC-SLP Speech Language Pathology, Acute Rehabilitation Services  Secure Chat preferred [phone]  10/09/2023, 4:47 PM

## 2023-10-09 NOTE — ED Notes (Signed)
 Patient daughter said her number is in the chart and would like an update on CT results

## 2023-10-09 NOTE — Progress Notes (Signed)
Patient is off the unit for MRI 

## 2023-10-09 NOTE — H&P (Signed)
 History and Physical    Patient: Edwin Wall FMW:[id number] DOB: 1940/10/02 DOA: 10/08/2023 DOS: the patient was seen and examined on 10/09/2023 PCP: Pcp, No  Patient coming from: Home  Chief Complaint:  Chief Complaint  Patient presents with   Code Stroke   HPI: Edwin Wall is a 83 y.o. male with medical history significant of PAF, HFpEF, and stage 3 colon cancer p/w GLF at home w/ elevated CK c/f rhabdomyolysis, as well as seizure given MRI showing cortical laminar necrosis.  Pt was in USOH until this morning at 0230 when he tried to help get his wife off the floor, who had fallen out of bed. When he reached down to help her up, he did a split and fell forward; he denies LOC or head strike. Unfortunately, he was unable to get off the floor until his son showed up at 1000, when he was helped back in bed and EMS was activated.  In the ED, pt hypotensive and tachypneic on RA. Labs notable for Cr 2.3-->2.1, albumin 2.5, BNP 147, CK >20,000, lactate 4.9, and WBC 10.6. CTH w/ NAICA. 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 EDP requested admission for ongoing w/u.  Review of Systems: As mentioned in the history of present illness. All other systems reviewed and are negative. Past Medical History:  Diagnosis Date   Anxiety    Arthritis    Bilateral leg edema    Cataract    Colon cancer (HCC) dx'd 11/2016   stage 3   Gout    Gout    Hemorrhoids    History of kidney stones    passed it   Hypertension    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HCC)    EF 45-50% by echo 10/22   PAF (paroxysmal atrial fibrillation) (HCC)    Pre-diabetes    Past Surgical History:  Procedure Laterality Date   CARDIOVERSION  [name] 04/23/2021   Procedure: CARDIOVERSION;  Surgeon: Kate Lonni CROME, MD;  Location: [facility] ENDOSCOPY;  Service: Cardiovascular;  Laterality: [name];   COLECTOMY  12/17/2016   lap; partial sigmoid colectomy/notes 12/17/2016   COLONOSCOPY W/ BIOPSIES AND POLYPECTOMY  11/11/2016   LAPAROSCOPIC SIGMOID COLECTOMY [name] 12/17/2016   Procedure: LAPAROSCOPIC SIGMOID COLECTOMY;  Surgeon: Aron Shoulders, MD;  Location: [facility] OR;  Service: General;  Laterality: [name];   LEFT HEART CATH AND CORONARY ANGIOGRAPHY [name] 03/11/2021   Procedure: LEFT HEART CATH AND CORONARY ANGIOGRAPHY;  Surgeon: Mady Lonni, MD;  Location: [facility] INVASIVE CV LAB;  Service: Cardiovascular;  Laterality: [name];   NASAL SEPTUM SURGERY     TONSILLECTOMY     Social History:  reports that he has never smoked. He has never used smokeless tobacco. He reports that he does not drink alcohol and does not use drugs.  No Known Allergies  Family History  Problem Relation Age of Onset   Cancer Mother        LUNG   COPD Father     Prior to Admission medications   Medication Sig Start Date End Date Taking? Authorizing Provider  apixaban  (ELIQUIS ) 2.5 MG TABS tablet Take 1 tablet (2.5 mg total) by mouth 2 (two) times daily. 08/10/23  Yes Turner, Wilbert SAUNDERS, MD  colestipol  (COLESTID ) 1 g tablet Take 1 g by mouth 2 (two) times  daily.   Yes [provider]  ezetimibe  (ZETIA ) 10 MG tablet TAKE 1 TABLET(10 MG) BY MOUTH DAILY 09/11/23  Yes Turner, Traci R, MD  furosemide  (LASIX ) 20 MG tablet TAKE 1 TABLET(20 MG) BY MOUTH DAILY 07/03/22  Yes Turner, Traci R, MD  levETIRAcetam  (KEPPRA ) 750 MG tablet TAKE 1 TABLET(750 MG) BY MOUTH TWICE DAILY 04/02/23  Yes Georjean Darice HERO, MD  levocetirizine (XYZAL) 5 MG tablet Take 2.5 mg by mouth every evening. 08/12/23  Yes [provider]  potassium chloride  (KLOR-CON  M) 10 MEQ tablet Take 10 mEq by mouth daily.   Yes [provider]  rosuvastatin  (CRESTOR ) 40 MG tablet Take 1 tablet (40 mg total) by  mouth daily. 08/01/20  Yes Maurice Sharlet RAMAN, [place]    Physical Exam: Vitals:   10/09/23 0720 10/09/23 0735 10/09/23 0745 10/09/23 0750  BP: (!) 81/68 98/77 110/66   Pulse:  67    Resp: 20 20 20 19   Temp:      TempSrc:      SpO2:  100%  100%   General: Alert, oriented x3, resting comfortably in no acute distress HEENT: EOMI, oropharynx clear, moist mucous membranes, hearing intact Neck: Trachea midline and no gross thyromegaly Respiratory: Lungs clear to auscultation bilaterally with normal respiratory effort; no w/r/r Cardiovascular: Regular rate and rhythm w/o m/r/g Abdomen: Soft, nontender, nondistended. Positive bowel sounds MSK: No obvious joint deformities or swelling Skin: No obvious rashes or lesions Neurologic: Awake, alert, spontaneously moves all extremities, strength intact Psychiatric: Appropriate mood and affect, conversational and cooperative  Data Reviewed:  Lab Results  Component Value Date   WBC 10.6 (H) 10/08/2023   HGB 14.6 10/08/2023   HCT 43.0 10/08/2023   MCV 97.4 10/08/2023   PLT 171 10/08/2023   Lab Results  Component Value Date   GLUCOSE 172 (H) 10/08/2023   CALCIUM  8.1 (L) 10/08/2023   NA 140 10/08/2023   K 3.8 10/08/2023   CO2 19 (L) 10/08/2023   CL 105 10/08/2023   BUN 37 (H) 10/08/2023   CREATININE 2.10 (H) 10/08/2023   Lab Results  Component Value Date   ALT 46 (H) 10/08/2023   AST 174 (H) 10/08/2023   ALKPHOS 58 10/08/2023   BILITOT 1.4 (H) 10/08/2023   Lab Results  Component Value Date   INR 1.4 (H) 10/08/2023   INR 1.2 03/05/2022   INR 1.4 (H) 07/27/2021    Radiology: MR BRAIN WO CONTRAST Result Date: 10/09/2023 EXAM: MRI BRAIN WITHOUT CONTRAST 04/14/2021 TECHNIQUE: Multiplanar multisequence MRI of the head/brain was performed without the administration of intravenous contrast. COMPARISON: CT head without contrast and CT angio head and neck and CT perfusion 10/08/23. CLINICAL HISTORY: FINDINGS: BRAIN AND VENTRICLES: No acute  infarct. No intracranial hemorrhage. No mass. No midline shift. No hydrocephalus. The sella is unremarkable. Normal flow voids. Chronic encephalomalacia of the anterior left frontal lobe and insular cortex is stable. Ex vacuo dilation of the left lateral ventricle is again noted. 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 ORBITS: No acute abnormality. SINUSES AND MASTOIDS: Bilateral mastoid effusions are present. BONES AND SOFT TISSUES: Normal bone marrow signal. No acute soft tissue abnormality. IMPRESSION: 1. 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 2. 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 3. Bilateral  mastoid effusions. Electronically signed by: Lonni Necessary MD 10/09/2023 05:14 AM EDT RP Workstation: [id number]   CT ANGIO HEAD NECK W WO CM W PERF (CODE STROKE) Result Date: 10/08/2023 CLINICAL DATA:  Neuro deficit, acute, stroke suspected EXAM: CT ANGIOGRAPHY HEAD AND NECK CT PERFUSION BRAIN TECHNIQUE: Multidetector CT imaging of the head and neck was performed using the standard protocol during bolus administration of intravenous contrast. Multiplanar CT image reconstructions and MIPs were obtained to evaluate the vascular anatomy. Carotid stenosis measurements (when applicable) are obtained utilizing NASCET criteria, using the distal internal carotid diameter as the denominator. RADIATION DOSE REDUCTION: This exam was performed according to the departmental dose-optimization program which includes automated exposure control, adjustment of the mA and/or kV according to patient size and/or use of iterative reconstruction technique. CONTRAST:  OMNIPAQUE  IOHEXOL  350 MG/ML SOLN COMPARISON:  None  Available. FINDINGS: CTA NECK FINDINGS Aortic arch: Great vessel origins are patent without significant stenosis. Right carotid system: Atherosclerosis of the distal common carotid artery without greater than 50% stenosis. Left carotid system: No evidence of dissection, stenosis (50% or greater) or occlusion. Vertebral arteries: Codominant. No evidence of dissection, stenosis (50% or greater) or occlusion. Skeleton: No acute fracture. Other neck: No acute abnormality on limited assessment. Upper chest: Lung apices are clear. Review of the MIP images confirms the above findings CTA HEAD FINDINGS Anterior circulation: Bilateral intracranial ICAs, MCAs and ACAs are patent without proximal hemodynamically significant stenosis. Posterior circulation: Bilateral intradural vertebral arteries, basilar artery and bilateral posterior legs are patent. Moderate left P2 PCA stenosis. Venous sinuses: Not well assessed due to arterial contrast bolus timing. Review of the MIP images confirms the above findings IMPRESSION: 1. No emergent large vessel occlusion. 2. Moderate left P2 PCA stenosis. Electronically Signed   By: Gilmore GORMAN Molt M.D.   On: 10/08/2023 23:51   CT HEAD CODE STROKE WO CONTRAST Result Date: 10/08/2023 CLINICAL DATA:  Code stroke.  Neuro deficit, acute, stroke suspected EXAM: CT HEAD WITHOUT CONTRAST TECHNIQUE: Contiguous axial images were obtained from the base of the skull through the vertex without intravenous contrast. RADIATION DOSE REDUCTION: This exam was performed according to the departmental dose-optimization program which includes automated exposure control, adjustment of the mA and/or kV according to patient size and/or use of iterative reconstruction technique. COMPARISON:  CT head 03/05/2022. FINDINGS: Brain: Remote left frontal and insular infarct. No evidence of acute large vascular territory infarct, acute hemorrhage, mass lesion or midline shift. No hydrocephalus. Vascular: No hyperdense  vessel identified. Calcific atherosclerosis. Skull: No acute fracture. Sinuses/Orbits: No acute findings. Other: No mastoid effusions. ASPECTS [facility] Stroke Program Early CT Score) Total score (0-10 with 10 being normal): 10. IMPRESSION: 1. No evidence of acute intracranial abnormality. ASPECTS is 10. 2. Remote left frontal and insular infarct. Code stroke imaging results were communicated on 10/08/2023 at 11:30 pm to provider Dr. Michaela via secure text paging. Electronically Signed   By: Gilmore GORMAN Molt M.D.   On: 10/08/2023 23:31    Assessment and Plan: 58M h/o PAF, HFpEF, and stage 3 colon cancer p/w GLF at home w/ elevated CK c/f rhabdomyolysis, as well as seizure given MRI showing cortical laminar necrosis.  Elevated CK Unclear etiology but leading Ddx includes sz > rhabdomyolysis given MRI w/ cortical laminar necrosis -Neurology consulted; apprec eval/recs -MIVF: NS at 100cc/h for now -HOLD pta apixaban  (resume once safe per Neurology) -Resume pta Keppra  750mg  BID -F/u EEG -F/u keppra  level -F/u UDS  Sepsis of unknown origin Elevated lactic acid Lactic  acid 4s to >9 despite 3L LR in ED -Start IV vancomycin  and IV zosyn  per pharmacy given rapid rise in lactic acid -F/u procalcitonin -F/u blood cultures  L hip bullae Presumable from friction of rubbing floor during prolonged GLF -CTM  R hip pain Pt reports inability to lift RLE; no external rotation/extension noted on exam -F/u R hip XR  Elevated BNP On PO lasix  20mg  pta; will need diuresis after  hydration 2/2 lactic acidosis -HOLD pta lasix  for now; MIVF per above; will likely need diuresis prior to d/c home  PAF -HOLD pta apixaban  for now; resume once safe per Neuro given cortical laminar necrosis above   Advance Care Planning:   Code Status: Full Code   Consults: Neurology  Family Communication: Wife  Severity of Illness: The appropriate patient status for this patient is INPATIENT. Inpatient status is  judged to be reasonable and necessary in order to provide the required intensity of service to ensure the patient's safety. The patient's presenting symptoms, physical exam findings, and initial radiographic and laboratory data in the context of their chronic comorbidities is felt to place them at high risk for further clinical deterioration. Furthermore, it is not anticipated that the patient will be medically stable for discharge from the hospital within 2 midnights of admission.   * I certify that at the point of admission it is my clinical judgment that the patient will require inpatient hospital care spanning beyond 2 midnights from the point of admission due to high intensity of service, high risk for further deterioration and high frequency of surveillance required.*   ------- I spent 60 minutes reviewing previous notes, at the bedside counseling/discussing the treatment plan, and performing clinical documentation.  Author: Marsha Ada, MD 10/09/2023 8:54 AM  For on call review [email or web address].

## 2023-10-10 ENCOUNTER — Inpatient Hospital Stay (HOSPITAL_COMMUNITY)

## 2023-10-10 ENCOUNTER — Ambulatory Visit (HOSPITAL_COMMUNITY)

## 2023-10-10 DIAGNOSIS — E785 Hyperlipidemia, unspecified: Secondary | ICD-10-CM

## 2023-10-10 DIAGNOSIS — I5022 Chronic systolic (congestive) heart failure: Secondary | ICD-10-CM | POA: Diagnosis not present

## 2023-10-10 DIAGNOSIS — I130 Hypertensive heart and chronic kidney disease with heart failure and stage 1 through stage 4 chronic kidney disease, or unspecified chronic kidney disease: Secondary | ICD-10-CM | POA: Diagnosis not present

## 2023-10-10 DIAGNOSIS — I6930 Unspecified sequelae of cerebral infarction: Secondary | ICD-10-CM | POA: Diagnosis not present

## 2023-10-10 DIAGNOSIS — N182 Chronic kidney disease, stage 2 (mild): Secondary | ICD-10-CM | POA: Diagnosis not present

## 2023-10-10 DIAGNOSIS — A419 Sepsis, unspecified organism: Secondary | ICD-10-CM

## 2023-10-10 DIAGNOSIS — R579 Shock, unspecified: Secondary | ICD-10-CM

## 2023-10-10 DIAGNOSIS — R4182 Altered mental status, unspecified: Secondary | ICD-10-CM | POA: Diagnosis not present

## 2023-10-10 DIAGNOSIS — G40909 Epilepsy, unspecified, not intractable, without status epilepticus: Secondary | ICD-10-CM

## 2023-10-10 DIAGNOSIS — I469 Cardiac arrest, cause unspecified: Secondary | ICD-10-CM

## 2023-10-10 DIAGNOSIS — N179 Acute kidney failure, unspecified: Secondary | ICD-10-CM | POA: Diagnosis not present

## 2023-10-10 DIAGNOSIS — R569 Unspecified convulsions: Secondary | ICD-10-CM | POA: Diagnosis not present

## 2023-10-10 DIAGNOSIS — I509 Heart failure, unspecified: Secondary | ICD-10-CM

## 2023-10-10 DIAGNOSIS — E872 Acidosis, unspecified: Secondary | ICD-10-CM

## 2023-10-10 DIAGNOSIS — J9612 Chronic respiratory failure with hypercapnia: Secondary | ICD-10-CM | POA: Insufficient documentation

## 2023-10-10 DIAGNOSIS — R7989 Other specified abnormal findings of blood chemistry: Secondary | ICD-10-CM

## 2023-10-10 DIAGNOSIS — M6282 Rhabdomyolysis: Secondary | ICD-10-CM | POA: Diagnosis not present

## 2023-10-10 DIAGNOSIS — R570 Cardiogenic shock: Secondary | ICD-10-CM

## 2023-10-10 LAB — POCT I-STAT 7, (LYTES, BLD GAS, ICA,H+H)
Acid-base deficit: 24 mmol/L — ABNORMAL HIGH (ref 0.0–2.0)
Acid-base deficit: 21 mmol/L — ABNORMAL HIGH (ref 0.0–2.0)
Acid-base deficit: 17 mmol/L — ABNORMAL HIGH (ref 0.0–2.0)
Bicarbonate: 8.8 mmol/L — ABNORMAL LOW (ref 20.0–28.0)
Bicarbonate: 8.9 mmol/L — ABNORMAL LOW (ref 20.0–28.0)
Bicarbonate: 10.3 mmol/L — ABNORMAL LOW (ref 20.0–28.0)
Calcium, Ion: 0.85 mmol/L — CL (ref 1.15–1.40)
Calcium, Ion: 0.91 mmol/L — ABNORMAL LOW (ref 1.15–1.40)
Calcium, Ion: 0.79 mmol/L — CL (ref 1.15–1.40)
HCT: 35 % — ABNORMAL LOW (ref 39.0–52.0)
HCT: 31 % — ABNORMAL LOW (ref 39.0–52.0)
HCT: 31 % — ABNORMAL LOW (ref 39.0–52.0)
Hemoglobin: 11.9 g/dL — ABNORMAL LOW (ref 13.0–17.0)
Hemoglobin: 10.5 g/dL — ABNORMAL LOW (ref 13.0–17.0)
Hemoglobin: 10.5 g/dL — ABNORMAL LOW (ref 13.0–17.0)
O2 Saturation: 98 %
O2 Saturation: 98 %
O2 Saturation: 100 %
Patient temperature: 35.7
Patient temperature: 35.4
Patient temperature: 35.4
Potassium: 5.7 mmol/L — ABNORMAL HIGH (ref 3.5–5.1)
Potassium: 4.9 mmol/L (ref 3.5–5.1)
Potassium: 5.1 mmol/L (ref 3.5–5.1)
Sodium: 137 mmol/L (ref 135–145)
Sodium: 139 mmol/L (ref 135–145)
Sodium: 137 mmol/L (ref 135–145)
TCO2: 10 mmol/L — ABNORMAL LOW (ref 22–32)
TCO2: 10 mmol/L — ABNORMAL LOW (ref 22–32)
TCO2: 11 mmol/L — ABNORMAL LOW (ref 22–32)
pCO2 arterial: 42.2 mmHg (ref 32–48)
pCO2 arterial: 32.4 mmHg (ref 32–48)
pCO2 arterial: 26.3 mmHg — ABNORMAL LOW (ref 32–48)
pH, Arterial: 6.918 — CL (ref 7.35–7.45)
pH, Arterial: 7.037 — CL (ref 7.35–7.45)
pH, Arterial: 7.193 — CL (ref 7.35–7.45)
pO2, Arterial: 159 mmHg — ABNORMAL HIGH (ref 83–108)
pO2, Arterial: 146 mmHg — ABNORMAL HIGH (ref 83–108)
pO2, Arterial: 283 mmHg — ABNORMAL HIGH (ref 83–108)

## 2023-10-10 LAB — COMPREHENSIVE METABOLIC PANEL WITH GFR
ALT: 1162 U/L — ABNORMAL HIGH (ref 0–44)
AST: 2053 U/L — ABNORMAL HIGH (ref 15–41)
Albumin: 1.8 g/dL — ABNORMAL LOW (ref 3.5–5.0)
Alkaline Phosphatase: 72 U/L (ref 38–126)
BUN: 50 mg/dL — ABNORMAL HIGH (ref 8–23)
CO2: <7 mmol/L — ABNORMAL LOW (ref 22–32)
Calcium: 7.6 mg/dL — ABNORMAL LOW (ref 8.9–10.3)
Chloride: 107 mmol/L (ref 98–111)
Creatinine, Ser: 3.9 mg/dL — ABNORMAL HIGH (ref 0.61–1.24)
GFR, Estimated: 15 mL/min — ABNORMAL LOW (ref >60)
Glucose, Bld: 144 mg/dL — ABNORMAL HIGH (ref 70–99)
Potassium: 5.7 mmol/L — ABNORMAL HIGH (ref 3.5–5.1)
Sodium: 141 mmol/L (ref 135–145)
Total Bilirubin: 1.8 mg/dL — ABNORMAL HIGH (ref 0.0–1.2)
Total Protein: 4.6 g/dL — ABNORMAL LOW (ref 6.5–8.1)

## 2023-10-10 LAB — BLOOD GAS, ARTERIAL
Acid-base deficit: 25.3 mmol/L — ABNORMAL HIGH (ref 0.0–2.0)
Bicarbonate: 4.9 mmol/L — ABNORMAL LOW (ref 20.0–28.0)
O2 Saturation: 99.5 %
Patient temperature: 35.2
pCO2 arterial: 19 mmHg — CL (ref 32–48)
pH, Arterial: 7 — CL (ref 7.35–7.45)
pO2, Arterial: 139 mmHg — ABNORMAL HIGH (ref 83–108)

## 2023-10-10 LAB — CBC
HCT: 44.7 % (ref 39.0–52.0)
Hemoglobin: 13.6 g/dL (ref 13.0–17.0)
MCH: 31 pg (ref 26.0–34.0)
MCHC: 30.4 g/dL (ref 30.0–36.0)
MCV: 101.8 fL — ABNORMAL HIGH (ref 80.0–100.0)
Platelets: 206 K/uL (ref 150–400)
RBC: 4.39 MIL/uL (ref 4.22–5.81)
RDW: 14.2 % (ref 11.5–15.5)
WBC: 18 K/uL — ABNORMAL HIGH (ref 4.0–10.5)
nRBC: 0.2 % (ref 0.0–0.2)

## 2023-10-10 LAB — CK: Total CK: >20000 U/L — ABNORMAL HIGH (ref 49–397)

## 2023-10-10 LAB — LACTIC ACID, PLASMA
Lactic Acid, Venous: >9 mmol/L (ref 0.5–1.9)
Lactic Acid, Venous: >9 mmol/L (ref 0.5–1.9)

## 2023-10-10 LAB — PROTIME-INR
INR: 2.9 — ABNORMAL HIGH (ref 0.8–1.2)
Prothrombin Time: 31.6 s — ABNORMAL HIGH (ref 11.4–15.2)

## 2023-10-10 LAB — MAGNESIUM: Magnesium: 3.7 mg/dL — ABNORMAL HIGH (ref 1.7–2.4)

## 2023-10-10 LAB — PHOSPHORUS: Phosphorus: 11.1 mg/dL — ABNORMAL HIGH (ref 2.5–4.6)

## 2023-10-10 MED ORDER — AMIODARONE LOAD VIA INFUSION
150.0000 mg | Freq: Once | INTRAVENOUS | Status: AC
  Administered 2023-10-10: 150 mg via INTRAVENOUS
  Filled 2023-10-10: qty 83.34

## 2023-10-10 MED ORDER — POLYETHYLENE GLYCOL 3350 17 G PO PACK: 17.0000 g | PACK | Freq: Every day | ORAL | Status: DC | PRN

## 2023-10-10 MED ORDER — CHLORHEXIDINE GLUCONATE CLOTH 2 % EX PADS
6.0000 | MEDICATED_PAD | Freq: Every day | CUTANEOUS | Status: DC
  Administered 2023-10-10: 6 via TOPICAL

## 2023-10-10 MED ORDER — ORAL CARE MOUTH RINSE: 15.0000 mL | OROMUCOSAL | Status: DC | PRN

## 2023-10-10 MED ORDER — PIPERACILLIN-TAZOBACTAM IN DEX 2-0.25 GM/50ML IV SOLN
2.2500 g | Freq: Three times a day (TID) | INTRAVENOUS | Status: DC
  Filled 2023-10-10: qty 50

## 2023-10-10 MED ORDER — ORAL CARE MOUTH RINSE
15.0000 mL | OROMUCOSAL | Status: DC
  Administered 2023-10-10: 15 mL via OROMUCOSAL

## 2023-10-10 MED ORDER — VASOPRESSIN 20 UNITS/100 ML INFUSION FOR SHOCK
0.0000 [IU]/min | INTRAVENOUS | Status: DC
  Administered 2023-10-10 (×2): 0.04 [IU]/min via INTRAVENOUS
  Filled 2023-10-10 (×2): qty 100

## 2023-10-10 MED ORDER — HEPARIN SODIUM (PORCINE) 1000 UNIT/ML DIALYSIS: 1000.0000 [IU] | INTRAMUSCULAR | Status: DC | PRN

## 2023-10-10 MED ORDER — FAMOTIDINE 20 MG PO TABS: 20.0000 mg | ORAL_TABLET | Freq: Every day | ORAL | Status: DC

## 2023-10-10 MED ORDER — DOCUSATE SODIUM 50 MG/5ML PO LIQD: 100.0000 mg | Freq: Two times a day (BID) | ORAL | Status: DC

## 2023-10-10 MED ORDER — SODIUM BICARBONATE 8.4 % IV SOLN
100.0000 meq | Freq: Once | INTRAVENOUS | Status: AC
  Administered 2023-10-10: 100 meq via INTRAVENOUS
  Filled 2023-10-10: qty 100

## 2023-10-10 MED ORDER — SODIUM BICARBONATE 8.4 % IV SOLN: 50.0000 meq | Freq: Once | INTRAVENOUS | Status: AC

## 2023-10-10 MED ORDER — PRISMASOL BGK 4/2.5 32-4-2.5 MEQ/L EC SOLN: Status: DC

## 2023-10-10 MED ORDER — SODIUM CHLORIDE 0.9 % IV SOLN: 250.0000 mL | INTRAVENOUS | Status: DC

## 2023-10-10 MED ORDER — STERILE WATER FOR INJECTION IV SOLN
INTRAVENOUS | Status: DC
  Filled 2023-10-10 (×2): qty 150

## 2023-10-10 MED ORDER — STERILE WATER FOR INJECTION IV SOLN
INTRAVENOUS | Status: DC
  Filled 2023-10-10 (×2): qty 1000

## 2023-10-10 MED ORDER — SODIUM BICARBONATE 8.4 % IV SOLN
INTRAVENOUS | Status: AC
  Administered 2023-10-10: 100 meq
  Filled 2023-10-10: qty 50

## 2023-10-10 MED ORDER — SODIUM CHLORIDE 0.9 % IV SOLN
250.0000 [IU]/h | INTRAVENOUS | Status: DC
  Administered 2023-10-10 (×2): 500 [IU]/h via INTRAVENOUS_CENTRAL
  Filled 2023-10-10: qty 10000

## 2023-10-10 MED ORDER — EPINEPHRINE HCL 5 MG/250ML IV SOLN IN NS
0.5000 ug/min | INTRAVENOUS | Status: DC
  Administered 2023-10-10: 5 ug/min via INTRAVENOUS
  Administered 2023-10-10: 20 ug/min via INTRAVENOUS
  Filled 2023-10-10 (×2): qty 250

## 2023-10-10 MED ORDER — SODIUM CHLORIDE 0.9 % IV BOLUS
500.0000 mL | Freq: Once | INTRAVENOUS | Status: AC
  Administered 2023-10-10: 500 mL via INTRAVENOUS

## 2023-10-10 MED ORDER — METHYLENE BLUE (ANTIDOTE) 1 % IV SOLN
2.0000 mg/kg | Freq: Once | INTRAVENOUS | Status: AC
  Administered 2023-10-10: 200 mg via INTRAVENOUS
  Filled 2023-10-10: qty 20

## 2023-10-10 MED ORDER — INSULIN ASPART 100 UNIT/ML IJ SOLN: 1.0000 [IU] | INTRAMUSCULAR | Status: DC

## 2023-10-10 MED ORDER — POLYETHYLENE GLYCOL 3350 17 G PO PACK: 17.0000 g | PACK | Freq: Every day | ORAL | Status: DC

## 2023-10-10 MED ORDER — HYDROCORTISONE SOD SUC (PF) 100 MG IJ SOLR
100.0000 mg | Freq: Two times a day (BID) | INTRAMUSCULAR | Status: DC
  Administered 2023-10-10: 100 mg via INTRAVENOUS
  Filled 2023-10-10: qty 2

## 2023-10-10 MED ORDER — FENTANYL CITRATE PF 50 MCG/ML IJ SOSY: 25.0000 ug | PREFILLED_SYRINGE | INTRAMUSCULAR | Status: DC | PRN

## 2023-10-10 MED ORDER — DEXMEDETOMIDINE HCL IN NACL 400 MCG/100ML IV SOLN: 0.0000 ug/kg/h | INTRAVENOUS | Status: DC

## 2023-10-10 MED ORDER — AMIODARONE HCL IN DEXTROSE 360-4.14 MG/200ML-% IV SOLN
60.0000 mg/h | INTRAVENOUS | Status: AC
  Administered 2023-10-10 (×2): 60 mg/h via INTRAVENOUS
  Filled 2023-10-10: qty 200

## 2023-10-10 MED ORDER — SODIUM BICARBONATE 8.4 % IV SOLN
INTRAVENOUS | Status: AC
  Administered 2023-10-10: 50 meq via INTRAVENOUS
  Filled 2023-10-10: qty 50

## 2023-10-10 MED ORDER — NOREPINEPHRINE 4 MG/250ML-% IV SOLN
0.0000 ug/min | INTRAVENOUS | Status: DC
  Administered 2023-10-10 (×2): 24 ug/min via INTRAVENOUS
  Administered 2023-10-10: 21 ug/min via INTRAVENOUS
  Administered 2023-10-10: 25 ug/min via INTRAVENOUS
  Filled 2023-10-10 (×2): qty 250

## 2023-10-10 MED ORDER — MAGNESIUM SULFATE 2 GM/50ML IV SOLN: 2.0000 g | Freq: Once | INTRAVENOUS | Status: AC

## 2023-10-10 MED ORDER — VASOPRESSIN 20 UNITS/100 ML INFUSION FOR SHOCK
INTRAVENOUS | Status: AC
  Administered 2023-10-10: 0.04 [IU]/min via INTRAVENOUS
  Filled 2023-10-10: qty 100

## 2023-10-10 MED ORDER — NOREPINEPHRINE 4 MG/250ML-% IV SOLN
0.0000 ug/min | INTRAVENOUS | Status: DC
  Administered 2023-10-10: 5 ug/min via INTRAVENOUS
  Filled 2023-10-10 (×2): qty 250

## 2023-10-10 MED ORDER — LEVETIRACETAM (KEPPRA) 500 MG/5 ML ADULT IV PUSH
750.0000 mg | Freq: Two times a day (BID) | INTRAVENOUS | Status: DC
  Administered 2023-10-10: 750 mg via INTRAVENOUS
  Filled 2023-10-10: qty 10

## 2023-10-10 MED ORDER — EPINEPHRINE HCL 5 MG/250ML IV SOLN IN NS
INTRAVENOUS | Status: AC
  Administered 2023-10-10: 5 mg
  Filled 2023-10-10: qty 250

## 2023-10-10 MED ORDER — CALCIUM GLUCONATE-NACL 1-0.675 GM/50ML-% IV SOLN
INTRAVENOUS | Status: AC
  Administered 2023-10-10: 1000 mg
  Filled 2023-10-10: qty 50

## 2023-10-10 MED ORDER — MAGNESIUM SULFATE 2 GM/50ML IV SOLN
INTRAVENOUS | Status: AC
  Administered 2023-10-10: 2 g via INTRAVENOUS
  Filled 2023-10-10: qty 50

## 2023-10-10 MED ORDER — PIPERACILLIN-TAZOBACTAM 3.375 G IVPB 30 MIN: 3.3750 g | Freq: Four times a day (QID) | INTRAVENOUS | Status: DC

## 2023-10-10 MED ORDER — EPINEPHRINE 1 MG/10ML IJ SOSY
0.5000 mg | PREFILLED_SYRINGE | Freq: Once | INTRAMUSCULAR | Status: AC
  Administered 2023-10-10: 0.5 mg via INTRAVENOUS

## 2023-10-10 MED ORDER — FAMOTIDINE 20 MG PO TABS: 20.0000 mg | ORAL_TABLET | Freq: Two times a day (BID) | ORAL | Status: DC

## 2023-10-10 MED ORDER — NOREPINEPHRINE 16 MG/250ML-% IV SOLN
0.0000 ug/min | INTRAVENOUS | Status: DC
  Administered 2023-10-10: 20 ug/min via INTRAVENOUS
  Filled 2023-10-10: qty 250

## 2023-10-10 MED ORDER — AMIODARONE HCL IN DEXTROSE 360-4.14 MG/200ML-% IV SOLN
30.0000 mg/h | INTRAVENOUS | Status: DC
  Filled 2023-10-10: qty 200

## 2023-10-11 LAB — LEVETIRACETAM LEVEL: Levetiracetam Lvl: 15.9 ug/mL (ref 10.0–40.0)

## 2023-10-14 LAB — CULTURE, BLOOD (ROUTINE X 2)
Culture: NO GROWTH
Culture: NO GROWTH
Special Requests: ADEQUATE

## 2023-10-14 LAB — CULTURE, BLOOD (SINGLE): Culture: NO GROWTH

## 2023-10-23 DIAGNOSIS — R569 Unspecified convulsions: Secondary | ICD-10-CM

## 2023-11-02 NOTE — Progress Notes (Addendum)
 MEWS Progress Note  Patient Details Name: Edwin Wall MRN: [id number] DOB: 30-Jul-1940 Today's Date: 10/29/2023   MEWS Flowsheet Documentation:  Assess: MEWS Score Temp: 97.6 F (36.4 C) BP: (!) 71/47 MAP (mmHg): (!) 55 Pulse Rate: (!) 55 ECG Heart Rate: (!) 122 Resp: (!) 36 Level of Consciousness: Alert SpO2: 100 % O2 Device: Nasal Cannula O2 Flow Rate (L/min): 4 L/min Assess: MEWS Score MEWS Temp: 0 MEWS Systolic: 2 MEWS Pulse: 2 MEWS RR: 3 MEWS LOC: 0 MEWS Score: 7 MEWS Score Color: Red Assess: SIRS CRITERIA SIRS Temperature : 0 SIRS Respirations : 1 SIRS Pulse: 1 SIRS WBC: 0 SIRS Score Sum : 2 SIRS Temperature : 0 SIRS Pulse: 1 SIRS Respirations : 1 SIRS WBC: 0 SIRS Score Sum : 2 Assess: if the MEWS score is Yellow or Red Were vital signs accurate and taken at a resting state?: Yes Does the patient meet 2 or more of the SIRS criteria?: Yes Does the patient have a confirmed or suspected source of infection?: Yes MEWS guidelines implemented : Yes, red Treat MEWS Interventions: Considered administering scheduled or prn medications/treatments as ordered Take Vital Signs Increase Vital Sign Frequency : Red: Q1hr x2, continue Q4hrs until patient remains green for 12hrs Escalate MEWS: Escalate: Red: Discuss with charge nurse and notify provider. Consider notifying RRT. If remains red for 2 hours consider need for higher level of care Notify: Charge Nurse/RN Name of Charge Nurse/RN Notified: Graeme Ferries, RN Provider Notification Provider Name/Title: Dr. Von Date Provider Notified: 10/25/2023 Time Provider Notified: 0137 Method of Notification: Page Notification Reason: Change in status Provider response: Evaluate remotely (Notify if MAP <55 for bolus) Date of Provider Response: 10/31/2023 Time of Provider Response: 0137 Notify: Rapid Response Name of Rapid Response RN Notified: [name] Date Rapid Response Notified: 10/13/2023 Time Rapid Response Notified:  0131  Patient not actively in distress despite increased respiratory rate. RN noted telemetry demonstrating increased heart rate (afib rhythm) and went in to check patient's vital signs. Blood pressure noted to be hypotensive and RR increased. Applied 4LNC for comfort, Spo2 >95% prior. Notified MD of VS and rapid response RN. MD requested RN notify if patient's MAP <55. Also noted to MD that urine output <150cc despite ongoing IVF. Will continue to follow MEWS guidelines and notify MD for any signs of deterioration.    Edwin Wall 10/24/2023, 1:47 AM  0208: Patient with increasing HR, sustaining afib 120s-130s but reaching 150, patient asleep. Notified MD, no new orders given at this time.   0230 Patient BP without improvement. Advocated patient be moved to higher level of care. MD ordered IVF bolus x1. Contacted rapid RN to discuss ICU criteria; then followed up with MD to request orders to transfer to ICU.  0300 PCCM consulted and came to bedside with [name]. Patient asked if he would like us  to call any family for him and declined at the time.  0425 Patient transferred to ICU with primary RN and [name]. Bedside report given to ICU RN.

## 2023-11-02 NOTE — Progress Notes (Signed)
 Pt noted to be asystole, Jeralyn Banner made aware. Order given for RN to pronounce. Rnx2 pronounced at 1736.   Medical examiner Duwaine Riddles called, ruled out as ME case.   [place] made aware by [employer].  Patient's daughter called, visiting her mother inpatient and made her aware of patient passing.

## 2023-11-02 NOTE — Progress Notes (Signed)
 PHARMACY NOTE:  ANTIMICROBIAL RENAL DOSAGE ADJUSTMENT  Current antimicrobial regimen includes a mismatch between antimicrobial dosage and estimated renal function.  As per policy approved by the Pharmacy & Therapeutics and Medical Executive Committees, the antimicrobial dosage will be adjusted accordingly.  Current antimicrobial dosage:  Zosyn  2.25 q8h  Indication: Sepsis  Renal Function:  CrCl cannot be calculated (Unknown ideal weight.). []      On intermittent HD, scheduled: [x]      On CRRT - Initiating    Antimicrobial dosage has been changed to:  Adjust Zosyn  to 3.375g IV q6 hours for CRRT  Additional comments:   Thank you for allowing pharmacy to be a part of this patient's care.  Harlene Boga, PharmD, BCPS, BCCCP Please refer to [facility] for [facility] numbers 10/27/2023 11:58 AM

## 2023-11-02 NOTE — Procedures (Signed)
 Arterial Catheter Insertion Procedure Note  Edwin Wall  [id number]  [birth year]  Date:10/31/2023  Time:5:35 AM    Provider Performing: Deward LELON Eastern    Procedure: Insertion of Arterial Line (63379) with US  guidance (23062)   Indication(s) Blood pressure monitoring and/or need for frequent ABGs  Consent Unable to obtain consent due to emergent nature of procedure.  Anesthesia None   Time Out Verified patient identification, verified procedure, site/side was marked, verified correct patient position, special equipment/implants available, medications/allergies/relevant history reviewed, required imaging and test results available.   Sterile Technique Maximal sterile technique including full sterile barrier drape, hand hygiene, sterile gown, sterile gloves, mask, hair covering, sterile ultrasound probe cover (if used).   Procedure Description Area of catheter insertion was cleaned with chlorhexidine  and draped in sterile fashion. With real-time ultrasound guidance an arterial catheter was placed into the right femoral artery.  Appropriate arterial tracings confirmed on monitor.     Complications/Tolerance None; patient tolerated the procedure well.   EBL Minimal   Specimen(s) None    Deward Eastern, AGACNP-BC [facility]  See Amion for personal pager PCCM on call pager [phone] until 7pm. Please call Elink 7p-7a. [phone]  10/25/2023 5:36 AM

## 2023-11-02 NOTE — Procedures (Signed)
 Central Venous Catheter Insertion Procedure Note  LOCHLAN GRYGIEL  [id number]  05-20-1940  Date:10/12/2023  Time:2:07 PM   Provider Performing:Pete FORBES Jenna   Procedure: Insertion of Non-tunneled Central Venous Catheter(36556)with US  guidance (23062)    Indication(s) Hemodialysis  Consent Risks of the procedure as well as the alternatives and risks of each were explained to the patient and/or caregiver.  Consent for the procedure was obtained and is signed in the bedside chart  Anesthesia Topical only with 1% lidocaine    Timeout Verified patient identification, verified procedure, site/side was marked, verified correct patient position, special equipment/implants available, medications/allergies/relevant history reviewed, required imaging and test results available.  Sterile Technique Maximal sterile technique including full sterile barrier drape, hand hygiene, sterile gown, sterile gloves, mask, hair covering, sterile ultrasound probe cover (if used).  Procedure Description Area of catheter insertion was cleaned with chlorhexidine  and draped in sterile fashion.   With real-time ultrasound guidance a HD catheter was placed into the left internal jugular vein.  Nonpulsatile blood flow and easy flushing noted in all ports.  The catheter was sutured in place and sterile dressing applied.  Complications/Tolerance None; patient tolerated the procedure well. Chest X-ray is ordered to verify placement for internal jugular or subclavian cannulation.  Chest x-ray is not ordered for femoral cannulation.  EBL Minimal  Specimen(s) None

## 2023-11-02 NOTE — Progress Notes (Signed)
 STROKE TEAM PROGRESS NOTE   INTERIM HISTORY/SUBJECTIVE RN at bedside.  Patient lying bed, his neurological exam unchanged this morning and he is awake but unresponsive and aphasic and not following commands.  He is moving his left side and has some tremors but not moving the right side as well.  MRI C-spine yesterday shows shows normal appearance of the spinal cord but multifactorial degenerative changes at multiple levels with foraminal narrowing.  There is abnormal soft tissue edema of the muscle suggestive of rhabdomyolysis.  Patient has severe acidosis, and is septic with elevated white count with severe rhabdomyolysis, hypotension, cardiogenic shock and EEG shows moderate diffuse encephalopathy.  OBJECTIVE  CBC    Component Value Date/Time   WBC 18.0 (H) 10/06/2023 0543   RBC 4.39 10/21/2023 0543   HGB 10.5 (L) 10/28/2023 1234   HGB 13.5 07/20/2023 1550   HCT 31.0 (L) 10/22/2023 1234   HCT 42.2 07/20/2023 1550   PLT 206 10/07/2023 0543   PLT 152 07/20/2023 1550   MCV 101.8 (H) 10/26/2023 0543   MCV 96 07/20/2023 1550   MCH 31.0 10/02/2023 0543   MCHC 30.4 10/11/2023 0543   RDW 14.2 10/26/2023 0543   RDW 12.0 07/20/2023 1550   LYMPHSABS 0.5 (L) 10/08/2023 2343   LYMPHSABS 1.6 07/20/2023 1550   MONOABS 0.8 10/08/2023 2343   EOSABS 0.0 10/08/2023 2343   EOSABS 0.3 07/20/2023 1550   BASOSABS 0.0 10/08/2023 2343   BASOSABS 0.1 07/20/2023 1550    BMET    Component Value Date/Time   NA 139 10/12/2023 1234   NA 144 07/20/2023 1550   K 4.9 10/27/2023 1234   CL 107 10/26/2023 0543   CO2 <7 (L) 10/26/2023 0543   GLUCOSE 144 (H) 10/25/2023 0543   BUN 50 (H) 10/12/2023 0543   BUN 21 07/20/2023 1550   CREATININE 3.90 (H) 10/14/2023 0543   CREATININE 1.49 (H) 01/15/2011 1057   CALCIUM  7.6 (L) 10/11/2023 0543   EGFR 45 (L) 07/20/2023 1550   GFRNONAA 15 (L) 10/04/2023 0543    IMAGING past 24 hours EEG adult Result Date: 10/15/2023 Shelton Arlin KIDD, MD     10/03/2023  1:23 PM  Patient Name: Edwin Wall MRN: [id number] Epilepsy Attending: Arlin KIDD Shelton Referring Physician/Provider: Waddell Karna LABOR, NP Date: 10/12/2023 Duration: 22.32 mins Patient history: 83yo M with ams. EEG to evaluate for seizure Level of alertness: comatose/ lethargic AEDs during EEG study: LEV Technical aspects: This EEG study was done with scalp electrodes positioned according to the 10-20 International system of electrode placement. Electrical activity was reviewed with band pass filter of 1-70Hz , sensitivity of 7 uV/mm, display speed of 47mm/sec with a 60Hz  notched filter applied as appropriate. EEG data were recorded continuously and digitally stored.  Video monitoring was available and reviewed as appropriate. Description: EEG showed continuous generalized predominantly 5 to 6 Hz theta slowing admixed with intermittent generalized 2-3hz  delta slowing. Hyperventilation and photic stimulation were not performed.   ABNORMALITY - Continuous slow, generalized IMPRESSION: This study is suggestive of moderate diffuse encephalopathy. No seizures or epileptiform discharges were seen throughout the recording. Arlin KIDD Shelton   DG CHEST PORT 1 VIEW Result Date: 10/14/2023 CLINICAL DATA:  Central line placement. EXAM: PORTABLE CHEST 1 VIEW COMPARISON:  Same day and CT chest 02/25/2022. FINDINGS: Left IJ central line terminates in the SVC. Right IJ central line tip is also in the SVC. Defibrillator pads overlie the left chest. Heart is enlarged, stable. Minimal bibasilar atelectasis. Lungs are low in  volume. Difficult to exclude a small left pleural effusion. IMPRESSION: 1. Left IJ central line placement without pneumothorax. 2. Low lung volumes with minimal bibasilar atelectasis. Difficult to exclude a small left effusion. Electronically Signed   By: Newell Eke M.D.   On: 10/30/2023 12:35   DG CHEST PORT 1 VIEW Result Date: 10/13/2023 CLINICAL DATA:  Central line placement. EXAM: PORTABLE CHEST 1 VIEW  COMPARISON:  03/05/2022 FINDINGS: Right IJ central line tip overlies the mid SVC level. Low lung volumes without evidence for pneumothorax or pleural effusion. Cardiopericardial silhouette is at upper limits of normal for size. Telemetry leads overlie the chest. IMPRESSION: Right IJ central line tip overlies the mid SVC level. No pneumothorax or pleural effusion. Electronically Signed   By: Camellia Candle M.D.   On: 10/17/2023 08:07   MR CERVICAL SPINE WO CONTRAST Result Date: 10/09/2023 CLINICAL DATA:  Initial evaluation for cervical radiculopathy. EXAM: MRI CERVICAL SPINE WITHOUT CONTRAST TECHNIQUE: Multiplanar, multisequence MR imaging of the cervical spine was performed. No intravenous contrast was administered. COMPARISON:  Prior CT from 03/05/2022. FINDINGS: Alignment: Straightening with mild reversal of the normal cervical lordosis. No significant listhesis. Vertebrae: Vertebral body height maintained without acute or chronic fracture. Bone marrow signal intensity within normal limits. No worrisome osseous lesions. No abnormal marrow edema. Cord: Normal signal and morphology. Posterior Fossa, vertebral arteries, paraspinal tissues: Visualized brain and posterior fossa within normal limits. Craniocervical junction normal. Abnormal edema seen throughout the soft tissues of the visualized right upper back (series 5, image 1). Visualized right trapezius muscle is swollen and edematous with associated signal abnormality. Findings are nonspecific, but suspected to reflect involving changes of rhabdomyolysis given patient history. Sequelae of acute trauma would be the primary differential consideration. No visible collections. Normal flow voids seen within the vertebral arteries bilaterally. Disc levels: C2-C3: Small central disc protrusion indents the ventral thecal sac. Mild to moderate right greater than left facet hypertrophy. No spinal stenosis. Foramina remain patent. C3-C4: Advanced intervertebral disc space  narrowing with partial ankylosis of the C3 and C4 vertebral bodies. Underlying disc bulge with endplate and uncovertebral spurring, asymmetric to the right. Flattening and partial effacement of the ventral thecal sac with minimal cord flattening, but no cord signal changes. No significant spinal stenosis. Moderate right C4 foraminal stenosis. Left neural foramen remains patent. C4-C5: Degenerate intervertebral disc space narrowing with right eccentric disc osteophyte complex. Flattening and partial effacement of the ventral thecal sac but without significant spinal stenosis. Moderate right C5 foraminal stenosis. Left neural foramen remains patent. C5-C6: Right eccentric disc bulge with right greater left uncovertebral spurring. Flattening of the ventral thecal sac without significant spinal stenosis. Severe right C6 foraminal stenosis. Left neural foramen remains patent. C6-C7: Degenerative vertebral disc space narrowing with diffuse disc osteophyte complex. Irregular broad-based posterior component flattens and indents the ventral thecal sac, asymmetric to the left. Mild cord flattening without cord signal changes. Moderate spinal stenosis with severe bilateral C7 foraminal narrowing. C7-T1: Mild disc bulge. Moderate right with mild left facet hypertrophy. No spinal stenosis. Foramina remain patent. IMPRESSION: 1. Abnormal edema throughout the visualized soft tissues and musculature of the visualized right upper back. Findings are suspected to reflect evolving changes of rhabdomyolysis given patient history. Sequelae of acute/recent trauma would be the primary differential consideration. 2. 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 3. Multifactorial degenerative changes with resultant multilevel foraminal narrowing as above. Notable findings include moderate right C4 and C5 foraminal stenosis, severe right C6 foraminal narrowing,  with severe bilateral C7 foraminal stenosis. 4.  Normal MRI appearance of the cervical spinal cord. Electronically Signed   By: Morene Hoard M.D.   On: 10/09/2023 23:13   DG HIP UNILAT WITH PELVIS 2-3 VIEWS RIGHT Result Date: 10/09/2023 CLINICAL DATA:  Right hip pain. EXAM: DG HIP (WITH OR WITHOUT PELVIS) 2-3V RIGHT COMPARISON:  None Available. FINDINGS: There is no evidence of hip fracture or dislocation. Mild osteophyte formation of right hip is noted without joint space narrowing. IMPRESSION: Mild degenerative joint disease of right hip. No acute abnormality seen. Electronically Signed   By: Lynwood Landy Raddle M.D.   On: 10/09/2023 18:30   ECHOCARDIOGRAM COMPLETE BUBBLE STUDY Result Date: 10/09/2023    ECHOCARDIOGRAM REPORT   Patient Name:   Edwin Wall Date of Exam: 10/09/2023 Medical Rec #:  [id number]            Height:       68.0 in Accession #:    [id number]           Weight:       220.4 lb Date of Birth:  06-01-1940           BSA:          2.130 m Patient Age:    82 years             BP:           99/66 mmHg Patient Gender: M                    HR:           68 bpm. Exam Location:  Inpatient Procedure: 2D Echo, Cardiac Doppler, Color Doppler, Saline Contrast Bubble Study            and Intracardiac Opacification Agent (Both Spectral and Color Flow            Doppler were utilized during procedure). Indications:    Stroke  History:        Patient has prior history of Echocardiogram examinations, most                 recent 02/25/2022. Stroke; Risk Factors:Hypertension. Cancer.  Sonographer:    Ellouise Mose RDCS Referring Phys: [id number] [facility]  Sonographer Comments: Technically difficult study due to poor echo windows. Image acquisition challenging due to patient body habitus. Attempted to turn patient. Images suboptimal even with Definity . IMPRESSIONS  1. Very poor acoustic windows limit study Definity  used. Hypokinesis of the inferoseptal, inferior, distal lateral anterior and apical walls.. Left ventricular ejection fraction, by  estimation, is 40 to 45%. The left ventricle has mildly decreased function. There is moderate concentric left ventricular hypertrophy. Left ventricular diastolic parameters are indeterminate.  2. Right ventricular systolic function is mildly reduced. The right ventricular size is normal. There is normal pulmonary artery systolic pressure. The estimated right ventricular systolic pressure is 22.5 mmHg.  3. The mitral valve is normal in structure. Trivial mitral valve regurgitation.  4. The aortic valve is tricuspid. Aortic valve regurgitation is not visualized. Aortic valve sclerosis/calcification is present, without any evidence of aortic stenosis.  5. The inferior vena cava is normal in size with greater than 50% respiratory variability, suggesting right atrial pressure of 3 mmHg.  6. Agitated saline contrast bubble study was negative, with no evidence of any interatrial shunt. FINDINGS  Left Ventricle: Very poor acoustic windows limit study Definity  used. Hypokinesis of the inferoseptal, inferior, distal lateral anterior and apical  walls. Left ventricular ejection fraction, by estimation, is 40 to 45%. The left ventricle has mildly decreased function. The left ventricular internal cavity size was small. There is moderate concentric left ventricular hypertrophy. Left ventricular diastolic parameters are indeterminate. Right Ventricle: The right ventricular size is normal. Right vetricular wall thickness was not assessed. Right ventricular systolic function is mildly reduced. There is normal pulmonary artery systolic pressure. The tricuspid regurgitant velocity is 2.21  m/s, and with an assumed right atrial pressure of 3 mmHg, the estimated right ventricular systolic pressure is 22.5 mmHg. Left Atrium: Left atrial size was normal in size. Right Atrium: Right atrial size was normal in size. Pericardium: There is no evidence of pericardial effusion. Mitral Valve: The mitral valve is normal in structure. Trivial mitral  valve regurgitation. Tricuspid Valve: The tricuspid valve is normal in structure. Tricuspid valve regurgitation is mild. Aortic Valve: The aortic valve is tricuspid. Aortic valve regurgitation is not visualized. Aortic valve sclerosis/calcification is present, without any evidence of aortic stenosis. Pulmonic Valve: The pulmonic valve was normal in structure. Pulmonic valve regurgitation is not visualized. Aorta: The aortic root and ascending aorta are structurally normal, with no evidence of dilitation. Venous: The inferior vena cava is normal in size with greater than 50% respiratory variability, suggesting right atrial pressure of 3 mmHg. IAS/Shunts: No atrial level shunt detected by color flow Doppler. Agitated saline contrast was given intravenously to evaluate for intracardiac shunting. Agitated saline contrast bubble study was negative, with no evidence of any interatrial shunt.  LEFT VENTRICLE PLAX 2D LVIDd:         3.30 cm LVIDs:         2.50 cm LV PW:         1.50 cm LV IVS:        1.40 cm LVOT diam:     2.20 cm LV SV:         38 LV SV Index:   18 LVOT Area:     3.80 cm  LV Volumes (MOD) LV vol d, MOD A2C: 37.2 ml LV vol d, MOD A4C: 111.0 ml LV vol s, MOD A2C: 22.0 ml LV vol s, MOD A4C: 47.7 ml LV SV MOD A2C:     15.2 ml LV SV MOD A4C:     111.0 ml LV SV MOD BP:      35.1 ml RIGHT VENTRICLE             IVC RV S prime:     12.70 cm/s  IVC diam: 1.40 cm TAPSE (M-mode): 1.2 cm LEFT ATRIUM           Index        RIGHT ATRIUM           Index LA diam:      3.10 cm 1.46 cm/m   RA Area:     12.90 cm LA Vol (A2C): 15.4 ml 7.23 ml/m   RA Volume:   25.70 ml  12.07 ml/m LA Vol (A4C): 36.0 ml 16.90 ml/m  AORTIC VALVE LVOT Vmax:   72.20 cm/s LVOT Vmean:  43.500 cm/s LVOT VTI:    0.100 m  AORTA Ao Root diam: 3.60 cm Ao Asc diam:  3.80 cm MITRAL VALVE               TRICUSPID VALVE MV Area (PHT): 2.60 cm    TR Peak grad:   19.5 mmHg MV Decel Time: 292 msec    TR Vmax:  221.00 cm/s MV E velocity: 61.80 cm/s                             SHUNTS                            Systemic VTI:  0.10 m                            Systemic Diam: 2.20 cm Vina Gull MD Electronically signed by Vina Gull MD Signature Date/Time: 10/09/2023/4:00:59 PM    Final     Vitals:   11/01/2023 1030 10/04/2023 1045 10/17/2023 1100 11/01/2023 1300  BP:      Pulse:      Resp: (!) 31 (!) 26 (!) 23   Temp: (!) 96.3 F (35.7 C) (!) 96.3 F (35.7 C) (!) 96.3 F (35.7 C)   TempSrc:      SpO2:      Weight:    100 kg     PHYSICAL EXAM General:  Alert, well-nourished, well-developed patient in no acute distress Psych:  Mood and affect appropriate for situation CV: Regular rate and rhythm on monitor Respiratory:  Regular, unlabored respirations on room air GI: Abdomen soft and nontender Skin: Pale and cool   NEURO:  Mental Status: Patient is awake and alert but is globally aphasic and not speaking or following commands.  His eyes open.  He has trace right gaze preference.  He blinks to threat more on the left than the right.  Right upper extremity is plegic in only trace withdrawal.  Right lower extremity withdraws to pain.  Has purposeful left upper and lower extremity movements.      ASSESSMENT/PLAN  Mr. BRAELYNN LUPTON is a 83 y.o. male with history of prior left MCA stroke paroxysmal A-fib on Eliquis , hypertension, CHF, stage III colon cancer, seizure disorder on home Keppra  presented to the emergency room after experiencing a fall and then having right arm weakness NIH on Admission 14  Old left hemispheric infarct with   recrudescence of old deficits due to severe hypertension, acidosis, cardiogenic shock, sepsis rhabdomyolysis Code Stroke  CT head No acute abnormality. ASPECTS 10.   Remote left frontal and insular infarct  CTA head & neck no LVO. Moderate left P2 PCA stenosis  MRI no acute process. 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 MR cervical spine ordered 2D Echo EF 40 to 45%, no PFO EEG intermittent generalized slowing, no seizures identified LDL 13 HgbA1c 6.2 VTE prophylaxis - SCDs Eliquis  (apixaban ) daily prior to admission, now on home Eliquis  2.5 mg twice daily Therapy recommendations: SNF Disposition: Pending  Seizure disorder  History of seizure, follows with Dr. Georjean at [facility] On Keppra  750 twice daily PTA Keppra  resumed in the hospital EEG intermittent slowing, no seizures identified  Hx of Stroke  07/2020 admitted for left large MCA infarct.  CT head and neck left M2 occlusion, CTP 26/43 cc.  Found to have new diagnosed A-fib.  LDL 127, A1c 7.1.  Discharged on Eliquis  and Crestor  40. Per patient, at baseline, he was able to walk without a cane.  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  Atrial fibrillation Home Meds: Eliquis  Continue telemetry monitoring Resume home Eliquis  2.5 mg twice daily  rhabdomyolysis  Lactic acidosis AKI on CKD 2 CK greater than 20,000 Lactic acid 4.9--4.6 Creatinine 2.39--2.10 Procalcitonin 2.84 Blood culture pending On IV fluid at 100 cc On Zosyn  and vancomycin   Hypertension CHF Nonischemic cardiomyopathy Home meds: Furosemide  20 mg BP stable This admission EF 40 to 45% Long-term BP goal normotensive  Hyperlipidemia Home meds: Zetia  10 mg and Crestor  40 mg  LDL 13, goal < 70 Elevated LFTs, AST/ALT 174/46 Consider to resume statin at a lower dose after LFT normalizes  Diabetes type II Controlled Home meds: None HgbA1c 6.2, goal < 7.0 CBGs SSI Recommend close follow-up with PCP for better DM control  Other Stroke Risk Factors Advanced age  Other Active Problems 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  Neurovascular imaging shows no acute stroke.  Check EEG for seizures.  Continue Keppra  and the present home dose of 750 mg twice daily.  Check EEG at the bedside.  Patient prognosis quite poor.  Long discussion with Jeralyn Banner critical care NP and answered questions.  No family at the bedside. This patient is critically ill and at significant risk of neurological worsening, death and care requires constant monitoring of vital signs, hemodynamics,respiratory and cardiac monitoring, extensive review of multiple databases, frequent neurological assessment, discussion with family, other specialists and medical decision making of high complexity.I have made any additions or clarifications directly to the above note.This critical care time does not reflect procedure time, or teaching time or supervisory time of PA/NP/Med Resident etc but could involve care discussion time.  I spent 30 minutes of neurocritical care time  in the care of  this patient.    Eather Popp, MD Stroke Neurology 10/11/2023 1:58 PM        To contact Stroke Continuity provider, please refer to [email or web address]. After hours, contact General Neurology

## 2023-11-02 NOTE — Procedures (Signed)
 Intubation Procedure Note  NEILL JUREWICZ  [id number]  December 21, 1940  Date:10/12/2023  Time:2:06 PM   Provider Performing:Pete E Jenna    Procedure: Intubation (31500)  Indication(s) Respiratory Failure  Consent Unable to obtain consent due to emergent nature of procedure.   Anesthesia none   Time Out Verified patient identification, verified procedure, site/side was marked, verified correct patient position, special equipment/implants available, medications/allergies/relevant history reviewed, required imaging and test results available.   Sterile Technique Usual hand hygeine, masks, and gloves were used   Procedure Description Patient positioned in bed supine.  Sedation given as noted above.  Patient was intubated with endotracheal tube using Glidescope.  View was Grade 1 full glottis .  Number of attempts was 1.  Colorimetric CO2 detector was consistent with tracheal placement.   Complications/Tolerance None; patient tolerated the procedure well. Chest X-ray is ordered to verify placement.   EBL Minimal   Specimen(s) None

## 2023-11-02 NOTE — Code Documentation (Signed)
 Cardiopulmonary Resuscitation Note  BRYAM TABORDA  [id number]  Mar 28, 1940  Date:10/26/2023  Time:2:05 PM   Provider Performing:Pete E Jenna   Procedure: Cardiopulmonary Resuscitation [phone])  Indication(s) Loss of Pulse  Consent [name]  Anesthesia [name]   Time Out [name]   Sterile Technique Hand hygiene, gloves   Procedure Description Called to patient's room for CODE BLUE. Initial rhythm was PEA/Asystole. Patient received high quality chest compressions for <1 minutes. Epinephrine  was administered X1 and 1 amp bicarb. Additional procedural interventions include intubation.  Return of spontaneous circulation was achieved.  Family to be notified.   Complications/Tolerance [name]   EBL [name]   Specimen(s) [name]  Estimated time to ROSC: 2 minutes

## 2023-11-02 NOTE — Progress Notes (Signed)
 NAME:  Edwin Wall, MRN:  [id number], DOB:  1940/07/19, LOS: 1 ADMISSION DATE:  10/08/2023, CONSULTATION DATE:  8/9 REFERRING MD:  Dr. 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 He presented to [facility] emergency department 8/8 with complaints of a ground-level fall after attempting to help his wife who had also fallen.  Workup in the emergency department was concerning for rhabdomyolysis with elevated CK greater than 20,000 as well as seizure.  New onset right sided weakness as well. MRI was non-acute. Neurology was consulted with concern for new CVA vs recurrent old stroke symptoms in the setting of acute illness. He was admitted to this hospitalist service for IVF resuscitation and treatment of rhabdo as well as possible sepsis. In the early AM hours of 8/9 he developed Atrial fibrillation with RVR and became hypotensive. PCCM was asked to evaluate.   Pertinent  Medical History    has a past medical history of Anxiety, Arthritis, Bilateral leg edema, Cataract, Colon cancer (HCC) (dx'd 11/2016), Gout, Gout, Hemorrhoids, History of kidney stones, Hypertension, 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HCC), PAF (paroxysmal atrial fibrillation) (HCC), and Pre-diabetes.  Significant Hospital Events: Including procedures, antibiotic start and stop dates in addition to other pertinent events   8/8 admit after fall with rhabdo and stroke symptoms 8/9 transfer to ICU  Interim History / Subjective:  Minimally responsive  Objective    Blood pressure (!) 56/29, pulse (!) 31, temperature (!) 96.3 F (35.7 C), resp. rate (!) 26, SpO2 96%.        Intake/Output Summary (Last 24 hours) at 10/07/2023  1059 Last data filed at 10/23/2023 1000 Gross per 24 hour  Intake 3492.32 ml  Output 100 ml  Net 3392.32 ml   There were no vitals filed for this visit.  Examination: General 83 year old male patient lying in bed with expressive aphasia minimally reactive, mottled and cold HEENT normocephalic atraumatic mucous membranes are dry neck veins are flat Pulmonary minimal respiratory efforts on initial exam.  Arterial blood gas evaluated pCO2 was 19.  Currently on nasal cannula Cardiac: Atrial fibrillation with controlled ventricular rate now currently regular irregular.  Extremities are cold and mottled pulses are weak and thready Neuro eyes are now open, initially he was unresponsive and prearrest appearing.  He is now responding but has generalized weakness.  Apparently earlier this a.m. was oriented GU minimal urine output  Resolved problem list   Assessment and Plan   Undifferentiated Shock w/ lactic acidosis, likely mixed picture of hypovolemic +/- sepsis, complicated by cardiogenic shock w/ known Dilated CM/HFrEF, and vasoplegia from acidosis  Echo current from 8/8 shows LVEF 40-45%. Lactic acid got as high  Plan Cont Norepi and vasopressin  for MAP goal > 65 Ck co-ox and CVP Start epinephrine  at fixed dose at 5 mics per minute Ensure euvolemic  Will need CRRT to assist w/ acidosis  bicarb gtt Add stress dose steroids  Holding home lasix  Holding home statin, ezetimibe , colestipol  while NPO Cont current abx day 1 but stop stop vanc Try to avoid intubation given severe acidosis and his current degree of respiratory compensation.  Would be difficult to achieve this with mechanical ventilation.  AKI 2/2 Rhabdomyolysis c/b CRRT Renal fxn and acidosis worse. tCKs still elevated Plan  Change IVFs to D5Ww/ 3 amps bicarb Cont to trend total CKs Ask renal to see  Will need CRRT  Atrial fib with RVR plan Cont amiodarone  gtt Change DOAC to IV heparin   Tele  K > 4, Mg >  2.5  Possible CVA vs old stroke deficits returning in the setting of acute illness. Bubble study negative. - Stroke service following.  Plan Supportive care  Repeat CT head once stable I will hold his heparin  for now given his complete change in status.  After his CT is done we will reassess  Seizure history Plan EEG Keppra  to IV as I doubt he will be taking PO     Best Practice (right click and Reselect all SmartList Selections daily)   Diet/type: NPO DVT prophylaxis DOAC Pressure ulcer(s): pressure ulcer assessment deferred  GI prophylaxis: [name] Lines: [name] - will place Foley:  [name]- will place Code Status:  full code Last date of multidisciplinary goals of care discussion [ ]    Critical care time: 60 min

## 2023-11-02 NOTE — Procedures (Addendum)
 Central Venous Catheter Insertion Procedure Note  Edwin Wall  [id number]  [birth year]  Date:10/28/2023  Time:5:34 AM   Provider Performing:Oswin Griffith LELON Rosan   Procedure: Insertion of Non-tunneled Central Venous Catheter(36556) with US  guidance (23062)   Indication(s) Medication administration  Consent Unable to obtain consent due to emergent nature of procedure.  Anesthesia Topical only with 1% lidocaine    Timeout Verified patient identification, verified procedure, site/side was marked, verified correct patient position, special equipment/implants available, medications/allergies/relevant history reviewed, required imaging and test results available.  Sterile Technique Maximal sterile technique including full sterile barrier drape, hand hygiene, sterile gown, sterile gloves, mask, hair covering, sterile ultrasound probe cover (if used).  Procedure Description Area of catheter insertion was cleaned with chlorhexidine  and draped in sterile fashion.  With real-time ultrasound guidance a central venous catheter was placed into the right internal jugular vein. Nonpulsatile blood flow and easy flushing noted in all ports.  The catheter was sutured in place and sterile dressing applied.  Complications/Tolerance None; patient tolerated the procedure well. Chest X-ray is ordered to verify placement for internal jugular or subclavian cannulation.   Chest x-ray is not ordered for femoral cannulation.  EBL Minimal  Specimen(s) None    Deward Rosan, AGACNP-BC [facility]  See Amion for personal pager PCCM on call pager [phone] until 7pm. Please call Elink 7p-7a. [phone]  10/15/2023 5:34 AM

## 2023-11-02 NOTE — Progress Notes (Signed)
 Chaplain responds to code blue and provides compassionate presence as medical team provides care to pt. No family members present. Chaplain will follow through shift.

## 2023-11-02 NOTE — Consult Note (Signed)
 NAME:  Edwin Wall, MRN:  [id number], DOB:  December 12, 1940, LOS: 1 ADMISSION DATE:  10/08/2023, CONSULTATION DATE:  8/9 REFERRING MD:  Dr. 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 He presented to [facility] emergency department 8/8 with complaints of a ground-level fall after attempting to help his wife who had also fallen.  Workup in the emergency department was concerning for rhabdomyolysis with elevated CK greater than 20,000 as well as seizure.  New onset right sided weakness as well. MRI was non-acute. Neurology was consulted with concern for new CVA vs recurrent old stroke symptoms in the setting of acute illness. He was admitted to this hospitalist service for IVF resuscitation and treatment of rhabdo as well as possible sepsis. In the early AM hours of 8/9 he developed Atrial fibrillation with RVR and became hypotensive. PCCM was asked to evaluate.   Pertinent  Medical History    has a past medical history of Anxiety, Arthritis, Bilateral leg edema, Cataract, Colon cancer (HCC) (dx'd 11/2016), Gout, Gout, Hemorrhoids, History of kidney stones, Hypertension, 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HCC), PAF (paroxysmal atrial fibrillation) (HCC), and Pre-diabetes.  Significant Hospital Events: Including procedures, antibiotic start and stop dates in addition to other pertinent events   8/8 admit after fall with rhabdo and stroke symptoms 8/9 transfer to ICU  Interim History / Subjective:    Objective    Blood pressure (!) 71/58, pulse (!) 36, temperature 97.6 F (36.4 C), temperature source Oral, resp. rate (!) 32, SpO2 100%.        Intake/Output Summary (Last 24 hours) at  10/30/2023 0329 Last data filed at 10/15/2023 0100 Gross per 24 hour  Intake 1498.63 ml  Output 100 ml  Net 1398.63 ml   There were no vitals filed for this visit.  Examination: General: Elderly male in no acute distress. In trendelenburg position HENT: /AT, no JVD Lungs: Clear bilateral breath sounds Cardiovascular: Tachy, IRIR, no MRG Abdomen: Soft, NT, ND Extremities: No acute deformity Neuro: Alert, oriented x 3, Good strength LUE/LLE. RLE weak, RUE flaccid.   Resolved problem list   Assessment and Plan   Shock: etiology uncertain. Hypovolemia in the setting of rhabdo vs sepsis of unclear etiology vs secondary to rapid atrial fib. Echo current from 8/8 shows LVEF 40-45%. Lactic acid got as high as 9, now down-trending. - Transfer to ICU - NE infusion for MAP goal 65 - Start amiodarone  bolus and drip  Rhabdomyolysis - Ongoing IVF resuscitation - Trend CK - Monitor urine output - may need HD if CHF limits volume resuscitation.  Atrial fib with RVR - Amiodarone  - continue Eliquis  2.5 mg for now - Consider cardioversion  Chronic HFrEF Dilated cardiomyopathy - Telemetry monitoring  -  Holding home lasix  - Holding home statin, ezetimibe , colestipol  while NPO  Possible CVA vs old stroke deficits returning in the setting of acute illness. Bubble study negative. - Stroke service following.   Seizure history - Keppra  to IV as I doubt he will be taking PO   Possible sepsis: antibiotics initiated due to elevated lactic. ? UTI. UA with large hgb and moderate leukocytes. BC pending.  - continue empiric zosyn , vancomycin  for now   Best Practice (right click and Reselect all SmartList Selections daily)  Diet/type: NPO DVT prophylaxis DOAC Pressure ulcer(s): pressure ulcer assessment deferred  GI prophylaxis: [name] Lines: [name] - will place Foley:  [name]- will place Code Status:  full code Last date of multidisciplinary goals of care discussion [ ]   Labs   CBC: Recent  Labs  Lab 10/08/23 2343 10/08/23 2346  WBC 10.6*  --   NEUTROABS 9.3*  --   HGB 14.0 14.6  HCT 44.2 43.0  MCV 97.4  --   PLT 171  --     Basic Metabolic Panel: Recent Labs  Lab 10/08/23 2343 10/08/23 2346  NA 138 140  K 3.9 3.8  CL 107 105  CO2 19*  --   GLUCOSE 189* 172*  BUN 33* 37*  CREATININE 2.39* 2.10*  CALCIUM  8.1*  --    GFR: CrCl cannot be calculated (Unknown ideal weight.). Recent Labs  Lab 10/08/23 2343 10/09/23 0537 10/09/23 0845 10/09/23 1109 10/09/23 1121  PROCALCITON  --   --   --  2.84  --   WBC 10.6*  --   --   --   --   LATICACIDVEN  --  4.9* >9.0*  --  4.6*    Liver Function Tests: Recent Labs  Lab 10/08/23 2343  AST 174*  ALT 46*  ALKPHOS 58  BILITOT 1.4*  PROT 5.3*  ALBUMIN 2.5*   No results for input(s): LIPASE, AMYLASE in the last 168 hours. No results for input(s): AMMONIA in the last 168 hours.  ABG    Component Value Date/Time   TCO2 20 (L) 10/08/2023 2346     Coagulation Profile: Recent Labs  Lab 10/08/23 2343  INR 1.4*    Cardiac Enzymes: Recent Labs  Lab 10/09/23 0537  CKTOTAL >20,000*    HbA1C: Hgb A1c MFr Bld  Date/Time Value Ref Range Status  10/09/2023 05:37 AM 6.4 (H) 4.8 - 5.6 % Final    Comment:    (NOTE)         Prediabetes: 5.7 - 6.4         Diabetes: >6.4         Glycemic control for adults with diabetes: <7.0   12/23/2020 02:59 PM 6.2 (H) 4.8 - 5.6 % Final    Comment:    (NOTE) Pre diabetes:          5.7%-6.4%  Diabetes:              >6.4%  Glycemic control for   <7.0% adults with diabetes     CBG: Recent Labs  Lab 10/08/23 2334  GLUCAP 152*    Review of Systems:   Patient is encephalopathic and/or intubated; therefore, history has been obtained from chart review.    Past Medical History:  He,  has a past medical history of Anxiety, Arthritis, Bilateral leg edema, Cataract, Colon cancer (HCC) (dx'd 11/2016), Gout, Gout, Hemorrhoids, History of kidney stones,  Hypertension, 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HCC), PAF (paroxysmal atrial fibrillation) (HCC), and Pre-diabetes.   Surgical History:   Past Surgical History:  Procedure Laterality Date   CARDIOVERSION [name] 04/23/2021   Procedure: CARDIOVERSION;  Surgeon: Kate Lonni CROME, MD;  [facility] END[facility]COPY;  Service: Cardiovascular;  Laterality: [name];   COLECTOMY  12/17/2016   lap; partial sigmoid colectomy/notes 12/17/2016   COLONOSCOPY W/ BIOPSIES AND POLYPECTOMY  11/11/2016   LAPAROSCOPIC SIGMOID COLECTOMY [name] 12/17/2016   Procedure: LAPAROSCOPIC SIGMOID COLECTOMY;  Surgeon: Aron Shoulders, MD;  Location: [facility] OR;  Service: General;  Laterality: [name];   LEFT HEART CATH AND  CORONARY ANGIOGRAPHY [name] 03/11/2021   Procedure: LEFT HEART CATH AND CORONARY ANGIOGRAPHY;  Surgeon: Mady Bruckner, MD;  Location: [facility] INVASIVE CV LAB;  Service: Cardiovascular;  Laterality: [name];   NASAL SEPTUM SURGERY     TONSILLECTOMY       Social History:   reports that he has never smoked. He has never used smokeless tobacco. He reports that he does not drink alcohol and does not use drugs.   Family History:  His family history includes COPD in his father; Cancer in his mother.   Allergies No Known Allergies   Home Medications  Prior to Admission medications   Medication Sig Start Date End Date Taking? Authorizing Provider  apixaban  (ELIQUIS ) 2.5 MG TABS tablet Take 1 tablet (2.5 mg total) by mouth 2 (two) times daily. 08/10/23  Yes Turner, Wilbert SAUNDERS, MD  colestipol  (COLESTID ) 1 g tablet Take 1 g by mouth 2 (two) times daily.   Yes [provider]  ezetimibe  (ZETIA ) 10 MG tablet TAKE 1 TABLET(10 MG) BY MOUTH DAILY 09/11/23  Yes Turner, Traci R, MD  furosemide  (LASIX ) 20 MG tablet TAKE 1 TABLET(20 MG) BY MOUTH DAILY 07/03/22  Yes Turner, Traci R, MD  levETIRAcetam  (KEPPRA ) 750 MG tablet TAKE 1 TABLET(750 MG) BY MOUTH TWICE DAILY 04/02/23  Yes Georjean Darice HERO, MD  levocetirizine (XYZAL) 5 MG tablet Take 2.5  mg by mouth every evening. 08/12/23  Yes [provider]  potassium chloride  (KLOR-CON  M) 10 MEQ tablet Take 10 mEq by mouth daily.   Yes [provider]  rosuvastatin  (CRESTOR ) 40 MG tablet Take 1 tablet (40 mg total) by mouth daily. 08/01/20  Yes Love, Sharlet RAMAN, PA-C     Critical care time: 49 min       Deward Eastern, AGACNP-BC [facility]  See Amion for personal pager PCCM on call pager [phone] until 7pm. Please call Elink 7p-7a. [phone]  10/06/2023 4:09 AM

## 2023-11-02 NOTE — Progress Notes (Signed)
 Attempted methylene blue  Non-responder Plan Cont care as outlined

## 2023-11-02 NOTE — Consult Note (Signed)
 Renal Service Consult Note [facility]  Edwin Wall 10/09/2023 Edwin JONETTA Fret, MD Requesting Physician: Dr. Shelah  Reason for Consult: Dr. Shelah HPI: The patient is a 83 y.o. year-old w/ PMH as below who presented after falling at home and lying on the floor for about 10 hours.  Workup in the ED showed CPK greater than 20,000 and new seizures.  MRI was nonacute.  Patient was admitted to the hospitalist service for IV fluids and treatment of rhabdomyolysis and possible sepsis.  Earlier today developed A-fib with RVR and became hypotensive and CCM was asked to see the patient.  Patient is to be admitted to ICU.  Severe metabolic acidosis with ABG pH 6.9, mild hyperkalemia, creatinine 3.9 and BUN 50.  We are asked to see for emergent CRRT.   Pt seen in ICU. Pt is quite lethargic, but will wake up and try to answer simple questions w/ loud voice. No hx obtained.    ROS - n/ano   Past Medical History  Past Medical History:  Diagnosis Date   Anxiety    Arthritis    Bilateral leg edema    Cataract    Colon cancer (HCC) dx'd 11/2016   stage 3   Gout    Gout    Hemorrhoids    History of kidney stones    passed it   Hypertension    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HCC)    EF 45-50% by echo 10/22   PAF (paroxysmal atrial fibrillation) (HCC)    Pre-diabetes    Past Surgical History  Past Surgical History:  Procedure Laterality Date   CARDIOVERSION [name] 04/23/2021   Procedure: CARDIOVERSION;  Surgeon: Kate Lonni CROME, MD;  Location: [facility] ENDOSCOPY;  Service: Cardiovascular;  Laterality: [name];   COLECTOMY  12/17/2016   lap; partial sigmoid colectomy/notes 12/17/2016   COLONOSCOPY W/ BIOPSIES AND POLYPECTOMY  11/11/2016   LAPAROSCOPIC SIGMOID COLECTOMY [name] 12/17/2016   Procedure: LAPAROSCOPIC SIGMOID COLECTOMY;  Surgeon: Aron Shoulders, MD;  Location: [facility] OR;  Service: General;  Laterality: [name];   LEFT HEART CATH AND CORONARY ANGIOGRAPHY [name] 03/11/2021    Procedure: LEFT HEART CATH AND CORONARY ANGIOGRAPHY;  Surgeon: Mady Lonni, MD;  Location: [facility] INVASIVE CV LAB;  Service: Cardiovascular;  Laterality: [name];   NASAL SEPTUM SURGERY     TONSILLECTOMY     Family History  Family History  Problem Relation Age of Onset   Cancer Mother        LUNG   COPD Father    Social History  reports that he has never smoked. He has never used smokeless tobacco. He reports that he does not drink alcohol and does not use drugs. Allergies No Known Allergies Home medications Prior to Admission medications   Medication Sig Start Date End Date Taking? Authorizing Provider  apixaban  (ELIQUIS ) 2.5 MG TABS tablet Take 1 tablet (2.5 mg total) by mouth 2 (two) times daily. 08/10/23  Yes Turner, Wilbert SAUNDERS, MD  colestipol  (COLESTID ) 1 g tablet Take 1 g by mouth 2 (two) times daily.   Yes [provider]  ezetimibe  (ZETIA ) 10 MG tablet TAKE 1 TABLET(10 MG) BY MOUTH DAILY 09/11/23  Yes Turner, Traci R, MD  furosemide  (LASIX ) 20 MG tablet TAKE 1 TABLET(20 MG) BY MOUTH DAILY 07/03/22  Yes Turner, Traci R, MD  levETIRAcetam  (KEPPRA ) 750 MG tablet TAKE 1 TABLET(750 MG) BY MOUTH TWICE DAILY 04/02/23  Yes Georjean Darice HERO, MD  levocetirizine (XYZAL) 5 MG tablet Take 2.5 mg by mouth  every evening. 08/12/23  Yes [provider]  potassium chloride  (KLOR-CON  M) 10 MEQ tablet Take 10 mEq by mouth daily.   Yes [provider]  rosuvastatin  (CRESTOR ) 40 MG tablet Take 1 tablet (40 mg total) by mouth daily. 08/01/20  Yes Maurice Sharlet RAMAN, PA-C     Vitals:   10/21/2023 1015 10/22/2023 1030 10/30/2023 1045 10/06/2023 1100  BP:      Pulse:      Resp: (!) 26 (!) 31 (!) 26 (!) 23  Temp: (!) 96.3 F (35.7 C) (!) 96.3 F (35.7 C) (!) 96.3 F (35.7 C) (!) 96.3 F (35.7 C)  TempSrc:      SpO2:       Exam Gen in ICU,  O2, elderly WM, quite lethargic Diffuse livedo pattern of LE's > UE's No jvd or bruit Chest clear anterior/ lateral RRR no RG Abd soft ntnd no mass or  ascites +bs GU foley in place, no UOP MS no joint effusions or deformity Ext 2+ bilat LE edema, 2+ UE edema Neuro as above  Home bp meds: Lasix  20 mg every day No htn meds  Date   Creat  eGFR (ml/min) 2012- 2022   1.45- 1.81 2023   1.43- 1.86 Jan- feb 2024  1.29- 1.47 48- 56 ml/min 07/20/23  1.52  45 ml/min 8/07   2.39  26 ml/min   10/10/23  3.90         Labs sodium 137, K+ 5.7, BUN 50, creatinine 3.9, calcium  7.6, magnesium  3.7, low phosphorus 11, albumin 1.8, CPK > 20,000  UA 8/8 - large Hb, mod LE, neg nitrite, prot 100 UNa, UCr pending CXR 8/09 - line in place, no edema/ congestion   Assessment/ Plan: AKI on CKD 3a: b/l creat 1.2- 1.5 from early 2024 and may 2025, eGFR 48-56 ml/min. Creat here was 2.3 on admission in the setting of acute rhabdomyolysis w/ elderly patient lying on the floor > 6-8 hrs. UA yest was negative. Renal US  is pending. Moderate fluid overload and severe complications including severe met acidosis which will require CRRT. AKI is due to acute rhabdomyolysis and/or hypotension / hypoperfusion.  Shock/ hypotension: is on high- dose levo and vaso gtts, per pmd Acute rhabdomyolysis: typically these crush injuries take about 4-7 days before the CPK starts to fall.   Severe metabolic acidosis: rx w/ CRRT, will use pre / post bicarbonate fluids as well.    Myer Fret  MD [facility] 10/06/2023, 11:29 AM  Recent Labs  Lab 10/08/23 2343 10/08/23 2346 10/30/2023 0543 10/19/2023 1057  HGB 14.0 14.6 13.6 11.9*  ALBUMIN 2.5*  --  1.8*  --   CALCIUM  8.1*  --  7.6*  --   PHOS  --   --  11.1*  --   CREATININE 2.39* 2.10* 3.90*  --   K 3.9 3.8 5.7* 5.7*   Inpatient medications:  Chlorhexidine  Gluconate Cloth  6 each Topical Daily   hydrocortisone  sod succinate (SOLU-CORTEF ) inj  100 mg Intravenous Q12H   levETIRAcetam   750 mg Intravenous Q12H    amiodarone      epinephrine  5 mcg/min (10/31/2023 1126)   norepinephrine  (LEVOPHED ) Adult infusion 21 mcg/min (10/12/2023  1120)   piperacillin -tazobactam (ZOSYN )  IV     sodium bicarbonate  150 mEq in sterile water  1,150 mL infusion 150 mL/hr at 10/27/2023 1100   vasopressin  0.04 Units/min (10/15/2023 1100)

## 2023-11-02 NOTE — Significant Event (Signed)
 Rapid Response Event Note   Reason for Call : Afib with RVR with hypotension in setting of SIRS  Initial Focused Assessment:  [name] is drowsy, oriented to self, place and time but falls asleep easily in Trendelenburg position. Right hemiplegia, dysarthric. BBS Clear anteriorly with diminished BS. Skin grossly edematous with blisters, cool, cyanotic and mottled extremities. NS bolus currently infusing. PCCM consulted and came to bedside.   0230-HR 133 afib, 69/35, RR 32 (bolus ordered)  0330- HR 131, 57/47, RR 35 -levo started  0425-HR 128, 93/74, RR 29 on 12 mcg Levo    Interventions:  -NS bolus 500cc -Levophed   -tx [id number]    MD Notified: Dr. Von per primary RN. PCCM consulted and came to bedside.  Call Time: 0130, 0238 Arrival Time: 0242 End Time: 0430  Griselda [name] ORN, RN

## 2023-11-02 NOTE — Progress Notes (Signed)
 I had arrived and the unit just as CODE BLUE was called. Patient lost spontaneous respiratory effort then subsequently developed brief PEA arrest. 1 amp of epinephrine  as well as another amp of bicarbonate was administered we began to see return of spontaneous circulation even prior to chest compressions.  Because he had clearly lost respiratory compensation I proceeded with intubation with plan to assist with respiratory compensation while we wait for CRRT to work  Plan Will continue current supportive care

## 2023-11-02 NOTE — Progress Notes (Signed)
 Nurse requested EEG wait until line is placed. Will complete EEG once patient is available.

## 2023-11-02 NOTE — Progress Notes (Signed)
 EEG complete - results pending

## 2023-11-02 NOTE — Consult Note (Addendum)
 WOC Nurse Consult Note: patient found down at home  Reason for Consult: wounds and blisters on B lower extremities  Wound type: 1.  Full thickness L posterior knee, medial R lower leg, R lower leg  2.  Partial thickness R upper leg likely from ruptured serum fill blistered, serum filled blisters to L upper leg  Pressure Injury POA: NA, appear to be from trauma; some to lower leg appear to be from chronic skin changes such as venous stasis  Measurement: see nursing flowsheet  Wound bed:L posterior knee 50% pink 50% tan; R upper leg pink, L upper leg intact serum filled blisters; R lower leg scattered pink, R medial leg red some dry brown; R lower leg dark scabbed  Drainage (amount, consistency, odor) see nursing flowsheet, appear to be mostly dry  Periwound: Dressing procedure/placement/frequency:  Cleanse blisters (open and intact)  to B upper legs with NS, apply Vaseline gauze ([place] [phone]) every other day and cover with silicone foam or ABD pad and tape whichever is preferred.  Cleanse L knee and B lower legs wounds with Vashe wound cleanser, do not rinse and allow to air dry.  Apply Xeroform gauze ([place]son [phone]) to wound beds every other day and secure with silicone foam or ABD pad and Kerlix whichever is preferred.  POC discussed with bedside nurse. WOC team will not follow. Re-consult if further needs arise.   Thank you,    Powell Bar MSN, RN-BC, [employer]

## 2023-11-02 NOTE — IPAL (Signed)
  Interdisciplinary Goals of Care Family Meeting   Date carried out: 10/20/2023  Location of the meeting: Bedside  Member's involved: Nurse Practitioner, Bedside Registered Nurse, and Family Member or next of kin  Durable Power of Attorney or acting medical decision maker: I wife Edwin Wall and daughter Edwin Wall  Discussion: We discussed goals of care for Edwin Wall .   The patient's daughter who is currently at bedside Edwin Wall, I also went upstairs and spoke separately to his wife Edwin Wall who is currently a patient upstairs on [address] discussed her current clinical situation including refractory vasoplegia, multiple organ failure, and profound acidosis now status postcardiac arrest x 1.  I have told his family that we have maximized and exhausted all medical interventions we have available for him.  He is not a candidate for mechanical circulatory support.  He is already on norepinephrine , epinephrine , and also vasopressin .  We are trying as 1 final attempt methylene blue  IV bolus.  The hope would be that we can sustain hemodynamic stability long enough to allow dialysis to improve his acid-base.  Beyond this we have nothing else to offer.  His daughter shared with me that he would not want extensive invasive interventions.  We are certainly beyond that point now.  Because of this we will continue current therapy which includes high-dose pressors, continuous renal replacement therapy, and mechanical ventilation.  Should he develop progressive cardiac arrest again I have told the family we will not and cannot offer CPR.  They understand, and accept this plan Code status:   Code Status: Do not attempt resuscitation (DNR) - Comfort care   Disposition: Continue current acute care Transition to comfort care should he arrest on current interventions Time spent for the meeting: 35 minutes    Edwin Wall Banner, NP  10/06/2023, 3:17 PM

## 2023-11-02 NOTE — Progress Notes (Addendum)
 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 Developed hypotension and afib RVR.  Patient is tachypneic, hypothermic, and hypotensive.  Desaturating despite nasal cannula.  Now on amiodarone  and norepinephrine  infusions.  Urinalysis grossly positive.  Mild electrolyte disturbances.  CK severely elevated, lactic acidosis resolving.  EF 40-45%.  eICU Interventions  Maintain vasopressor support, aggressive resuscitation with crystalloids  Maintain amiodarone   Broad-spectrum antibiotics in place, cultures ordered, trending lactate  Hold rosuvastatin  in the setting of rhabdomyolysis  Central line placed, will follow chest radiograph to verify positioning  DVT prophylaxis with apixaban  GI prophylaxis not indicated   0629 - 7/19/139; replace NS with Bicarb infusion   Intervention Category Evaluation Type: New Patient Evaluation  [name] 10/04/2023, 4:31 AM

## 2023-11-02 NOTE — Progress Notes (Signed)
 Critical ABG results given to NP, no changes to be made at this time.

## 2023-11-02 NOTE — Death Summary Note (Signed)
 DEATH SUMMARY   Patient Details  Name: CYRIL WOODMANSEE MRN: [id number] DOB: [birth year]  Admission/Discharge Information   Admit Date:  10-21-23  Date of Death: Date of Death: 10-23-23  Time of Death: Time of Death: 11-09-34  Length of Stay: 1  Referring Physician: Pcp, No   Reason(s) for Hospitalization  Ground-level fall, seizures, rhabdomyolysis  Diagnoses  Preliminary cause of death:  Undifferentiated shock, possible septic shock  Secondary Diagnoses (including complications and co-morbidities):  Principal Problem:   Shock (HCC) Active Problems:   Obesity (BMI 30-39.9)   Hypertension   Cancer of sigmoid colon (HCC)   Hyperlipidemia   Type II diabetes mellitus (HCC)   Atrial fibrillation (HCC)   AKI (acute kidney injury) (HCC)   Left middle cerebral artery stroke (HCC)   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HCC)   Acute on chronic heart failure (HCC)   Non-traumatic rhabdomyolysis   Recurrent falls   Stroke (HCC)   Rhabdomyolysis   Sepsis (HCC)   Cardiac arrest (HCC)   Seizures (HCC) Hyperkalemia Hypocalcemia Anion gap metabolic acidosis, lactic acidosis Transaminitis due to shock liver  Brief Hospital Course (including significant findings, care, treatment, and services provided and events leading to death)  LIJAH BOURQUE is a 83 y.o. year old male with a history of a dilated cardiomyopathy, colon cancer with sigmoid colectomy, atrial fibrillation on Eliquis , prior CVA. He experienced a ground-level fall on 8/8, was seen in the ED and had seizures, increased right-sided weakness. MRI brain was nonacute. Evaluation revealed rhabdomyolysis and renal failure with severe metabolic acidosis. He was admitted for IV fluid resuscitation and was treated empirically for possible sepsis without clear source. Course complicated by atrial fibrillation with RVR and associated cardiogenic shock. He moved to the ICU 2023-10-23 on amiodarone  and pressors.  He required rapidly  escalating support with increased pressor dosing, bicarbonate infusion.  He was treated empirically with broad-spectrum antibiotics for possible septic shock although no source identified.  Stress dose steroids were added.  He had improved rate control and amiodarone  was decreased, DOAC changed IV heparin .  Course complicated by acute renal failure in the setting of shock and his previous rhabdomyolysis with progressive anion gap metabolic acidosis.  HD catheter was placed and CVVHD was initiated.  He became more lethargic, encephalopathic as his shock worsened, resulted in poor airway protection, acute respiratory failure with associated hypoxemia.  This progressed to overt respiratory failure and cardiopulmonary arrest requiring ACLS and intubation for mechanical ventilation 2023/10/23.  Despite all aggressive interventions he continued to remain unstable.  Pressors were escalated, maximized.  Methylene blue  was given empirically.  Unfortunately the patient did not ever stabilize.  Discussions were undertaken with his family and decision was made that he should not be resuscitated should he arrest.  Unfortunately he never stabilized and devolved to PEA and then asystole 2023/10/23, 17:36.     Pertinent Labs and Studies  Significant Diagnostic Studies US  RENAL Result Date: 10-23-23 CLINICAL DATA:  [id number] Acute renal failure superimposed on stage 3A chronic kidney disease (HCC) 8250506 EXAM: RENAL / URINARY TRACT ULTRASOUND COMPLETE COMPARISON:  November 13, 2016 FINDINGS: Right Kidney: Renal measurements: 7.8 x 5.3 x 4.3 cm = volume: 93 mL. Cortical thinning with otherwise normal echogenicity. No mass. No hydronephrosis or nephrolithiasis. Left Kidney: Not well visualized due to overlying bowel gas and rib shadow. Bladder: Completely decompressed with a urinary catheter in place. Other: None. IMPRESSION: 1. The left kidney was not well visualized or evaluated due to overlying bowel gas and  rib shadow. No hydronephrosis  or nephrolithiasis in the right kidney. 2. Renal parenchymal changes suggestive of chronic medical renal disease. Electronically Signed   By: Rogelia Myers M.D.   On: 2023-11-05 17:05   Portable Chest x-ray Result Date: Nov 05, 2023 CLINICAL DATA:  Endotracheal tube, nasogastric tube placement. EXAM: PORTABLE CHEST 1 VIEW COMPARISON:  11/05/23 and CT chest 02/25/2022. FINDINGS: Endotracheal tube terminates approximately 6.8 cm above the carina. Nasogastric tube terminates in the lateral stomach. Bilateral IJ central line tips terminate in the SVC. Heart size stable. Defibrillator pads are seen the left chest. Lungs are low in volume minimal streaky atelectasis in the left lower lobe. No pleural fluid. IMPRESSION: 1. Satisfactory nasogastric tube placement. 2. Streaky left lower lobe atelectasis. Electronically Signed   By: Newell Eke M.D.   On: 05-Nov-2023 15:03   EEG adult Result Date: 05-Nov-2023 Shelton Arlin KIDD, MD     11/05/23  1:23 PM Patient Name: OATHER MUILENBURG MRN: [id number] Epilepsy Attending: Arlin KIDD Shelton Referring Physician/Provider: Waddell Karna LABOR, NP Date: 11/05/2023 Duration: 22.32 mins Patient history: 83yo M with ams. EEG to evaluate for seizure Level of alertness: comatose/ lethargic AEDs during EEG study: LEV Technical aspects: This EEG study was done with scalp electrodes positioned according to the 10-20 International system of electrode placement. Electrical activity was reviewed with band pass filter of 1-70Hz , sensitivity of 7 uV/mm, display speed of 51mm/sec with a 60Hz  notched filter applied as appropriate. EEG data were recorded continuously and digitally stored.  Video monitoring was available and reviewed as appropriate. Description: EEG showed continuous generalized predominantly 5 to 6 Hz theta slowing admixed with intermittent generalized 2-3hz  delta slowing. Hyperventilation and photic stimulation were not performed.   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 No seizures or epileptiform discharges were seen throughout the recording. Arlin KIDD Shelton   DG CHEST PORT 1 VIEW Result Date: November 05, 2023 CLINICAL DATA:  Central line placement. EXAM: PORTABLE CHEST 1 VIEW COMPARISON:  Same day and CT chest 02/25/2022. FINDINGS: Left IJ central line terminates in the SVC. Right IJ central line tip is also in the SVC. Defibrillator pads overlie the left chest. Heart is enlarged, stable. Minimal bibasilar atelectasis. Lungs are low in volume. Difficult to exclude a small left pleural effusion. IMPRESSION: 1. Left IJ central line placement without pneumothorax. 2. Low lung volumes with minimal bibasilar atelectasis. Difficult to exclude a small left effusion. Electronically Signed   By: Newell Eke M.D.   On: November 05, 2023 12:35   DG CHEST PORT 1 VIEW Result Date: 2023-11-05 CLINICAL DATA:  Central line placement. EXAM: PORTABLE CHEST 1 VIEW COMPARISON:  03/05/2022 FINDINGS: Right IJ central line tip overlies the mid SVC level. Low lung volumes without evidence for pneumothorax or pleural effusion. Cardiopericardial silhouette is at upper limits of normal for size. Telemetry leads overlie the chest. IMPRESSION: Right IJ central line tip overlies the mid SVC level. No pneumothorax or pleural effusion. Electronically Signed   By: Camellia Candle M.D.   On: November 05, 2023 08:07   MR CERVICAL SPINE WO CONTRAST Result Date: 10/09/2023 CLINICAL DATA:  Initial evaluation for cervical radiculopathy. EXAM: MRI CERVICAL SPINE WITHOUT CONTRAST TECHNIQUE: Multiplanar, multisequence MR imaging of the cervical spine was performed. No intravenous contrast was administered. COMPARISON:  Prior CT from 03/05/2022. FINDINGS: Alignment: Straightening with mild reversal of the normal cervical lordosis. No significant listhesis. Vertebrae: Vertebral body height maintained without acute or chronic fracture. Bone marrow signal  intensity  within normal limits. No worrisome osseous lesions. No abnormal marrow edema. Cord: Normal signal and morphology. Posterior Fossa, vertebral arteries, paraspinal tissues: Visualized brain and posterior fossa within normal limits. Craniocervical junction normal. Abnormal edema seen throughout the soft tissues of the visualized right upper back (series 5, image 1). Visualized right trapezius muscle is swollen and edematous with associated signal abnormality. Findings are nonspecific, but suspected to reflect involving changes of rhabdomyolysis given patient history. Sequelae of acute trauma would be the primary differential consideration. No visible collections. Normal flow voids seen within the vertebral arteries bilaterally. Disc levels: C2-C3: Small central disc protrusion indents the ventral thecal sac. Mild to moderate right greater than left facet hypertrophy. No spinal stenosis. Foramina remain patent. C3-C4: Advanced intervertebral disc space narrowing with partial ankylosis of the C3 and C4 vertebral bodies. Underlying disc bulge with endplate and uncovertebral spurring, asymmetric to the right. Flattening and partial effacement of the ventral thecal sac with minimal cord flattening, but no cord signal changes. No significant spinal stenosis. Moderate right C4 foraminal stenosis. Left neural foramen remains patent. C4-C5: Degenerate intervertebral disc space narrowing with right eccentric disc osteophyte complex. Flattening and partial effacement of the ventral thecal sac but without significant spinal stenosis. Moderate right C5 foraminal stenosis. Left neural foramen remains patent. C5-C6: Right eccentric disc bulge with right greater left uncovertebral spurring. Flattening of the ventral thecal sac without significant spinal stenosis. Severe right C6 foraminal stenosis. Left neural foramen remains patent. C6-C7: Degenerative vertebral disc space narrowing with diffuse disc osteophyte complex.  Irregular broad-based posterior component flattens and indents the ventral thecal sac, asymmetric to the left. Mild cord flattening without cord signal changes. Moderate spinal stenosis with severe bilateral C7 foraminal narrowing. C7-T1: Mild disc bulge. Moderate right with mild left facet hypertrophy. No spinal stenosis. Foramina remain patent. IMPRESSION: 1. Abnormal edema throughout the visualized soft tissues and musculature of the visualized right upper back. Findings are suspected to reflect evolving changes of rhabdomyolysis given patient history. Sequelae of acute/recent trauma would be the primary differential consideration. 2. 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 3. Multifactorial degenerative changes with resultant multilevel foraminal narrowing as above. Notable findings include moderate right C4 and C5 foraminal stenosis, severe right C6 foraminal narrowing, with severe bilateral C7 foraminal stenosis. 4. Normal MRI appearance of the cervical spinal cord. Electronically Signed   By: Morene Hoard M.D.   On: 10/09/2023 23:13   DG HIP UNILAT WITH PELVIS 2-3 VIEWS RIGHT Result Date: 10/09/2023 CLINICAL DATA:  Right hip pain. EXAM: DG HIP (WITH OR WITHOUT PELVIS) 2-3V RIGHT COMPARISON:  None Available. FINDINGS: There is no evidence of hip fracture or dislocation. Mild osteophyte formation of right hip is noted without joint space narrowing. IMPRESSION: Mild degenerative joint disease of right hip. No acute abnormality seen. Electronically Signed   By: Lynwood Landy Raddle M.D.   On: 10/09/2023 18:30   ECHOCARDIOGRAM COMPLETE BUBBLE STUDY Result Date: 10/09/2023    ECHOCARDIOGRAM REPORT   Patient Name:   SADAT SLIWA Date of Exam: 10/09/2023 Medical Rec #:  [id number]            Height:       68.0 in Accession #:    [id number]           Weight:       220.4 lb Date of Birth:  1940-07-09           BSA:  2.130 m Patient Age:    82 years              BP:           99/66 mmHg Patient Gender: M                    HR:           68 bpm. Exam Location:  Inpatient Procedure: 2D Echo, Cardiac Doppler, Color Doppler, Saline Contrast Bubble Study            and Intracardiac Opacification Agent (Both Spectral and Color Flow            Doppler were utilized during procedure). Indications:    Stroke  History:        Patient has prior history of Echocardiogram examinations, most                 recent 02/25/2022. Stroke; Risk Factors:Hypertension. Cancer.  Sonographer:    Ellouise Mose RDCS Referring Phys: [id number] [facility]  Sonographer Comments: Technically difficult study due to poor echo windows. Image acquisition challenging due to patient body habitus. Attempted to turn patient. Images suboptimal even with Definity . IMPRESSIONS  1. Very poor acoustic windows limit study Definity  used. Hypokinesis of the inferoseptal, inferior, distal lateral anterior and apical walls.. Left ventricular ejection fraction, by estimation, is 40 to 45%. The left ventricle has mildly decreased function. There is moderate concentric left ventricular hypertrophy. Left ventricular diastolic parameters are indeterminate.  2. Right ventricular systolic function is mildly reduced. The right ventricular size is normal. There is normal pulmonary artery systolic pressure. The estimated right ventricular systolic pressure is 22.5 mmHg.  3. The mitral valve is normal in structure. Trivial mitral valve regurgitation.  4. The aortic valve is tricuspid. Aortic valve regurgitation is not visualized. Aortic valve sclerosis/calcification is present, without any evidence of aortic stenosis.  5. The inferior vena cava is normal in size with greater than 50% respiratory variability, suggesting right atrial pressure of 3 mmHg.  6. Agitated saline contrast bubble study was negative, with no evidence of any interatrial shunt. FINDINGS  Left Ventricle: Very poor acoustic windows limit study Definity  used.  Hypokinesis of the inferoseptal, inferior, distal lateral anterior and apical walls. Left ventricular ejection fraction, by estimation, is 40 to 45%. The left ventricle has mildly decreased function. The left ventricular internal cavity size was small. There is moderate concentric left ventricular hypertrophy. Left ventricular diastolic parameters are indeterminate. Right Ventricle: The right ventricular size is normal. Right vetricular wall thickness was not assessed. Right ventricular systolic function is mildly reduced. There is normal pulmonary artery systolic pressure. The tricuspid regurgitant velocity is 2.21  m/s, and with an assumed right atrial pressure of 3 mmHg, the estimated right ventricular systolic pressure is 22.5 mmHg. Left Atrium: Left atrial size was normal in size. Right Atrium: Right atrial size was normal in size. Pericardium: There is no evidence of pericardial effusion. Mitral Valve: The mitral valve is normal in structure. Trivial mitral valve regurgitation. Tricuspid Valve: The tricuspid valve is normal in structure. Tricuspid valve regurgitation is mild. Aortic Valve: The aortic valve is tricuspid. Aortic valve regurgitation is not visualized. Aortic valve sclerosis/calcification is present, without any evidence of aortic stenosis. Pulmonic Valve: The pulmonic valve was normal in structure. Pulmonic valve regurgitation is not visualized. Aorta: The aortic root and ascending aorta are structurally normal, with no evidence of dilitation. Venous: The inferior vena cava is normal in size with  greater than 50% respiratory variability, suggesting right atrial pressure of 3 mmHg. IAS/Shunts: No atrial level shunt detected by color flow Doppler. Agitated saline contrast was given intravenously to evaluate for intracardiac shunting. Agitated saline contrast bubble study was negative, with no evidence of any interatrial shunt.  LEFT VENTRICLE PLAX 2D LVIDd:         3.30 cm LVIDs:         2.50 cm LV  PW:         1.50 cm LV IVS:        1.40 cm LVOT diam:     2.20 cm LV SV:         38 LV SV Index:   18 LVOT Area:     3.80 cm  LV Volumes (MOD) LV vol d, MOD A2C: 37.2 ml LV vol d, MOD A4C: 111.0 ml LV vol s, MOD A2C: 22.0 ml LV vol s, MOD A4C: 47.7 ml LV SV MOD A2C:     15.2 ml LV SV MOD A4C:     111.0 ml LV SV MOD BP:      35.1 ml RIGHT VENTRICLE             IVC RV S prime:     12.70 cm/s  IVC diam: 1.40 cm TAPSE (M-mode): 1.2 cm LEFT ATRIUM           Index        RIGHT ATRIUM           Index LA diam:      3.10 cm 1.46 cm/m   RA Area:     12.90 cm LA Vol (A2C): 15.4 ml 7.23 ml/m   RA Volume:   25.70 ml  12.07 ml/m LA Vol (A4C): 36.0 ml 16.90 ml/m  AORTIC VALVE LVOT Vmax:   72.20 cm/s LVOT Vmean:  43.500 cm/s LVOT VTI:    0.100 m  AORTA Ao Root diam: 3.60 cm Ao Asc diam:  3.80 cm MITRAL VALVE               TRICUSPID VALVE MV Area (PHT): 2.60 cm    TR Peak grad:   19.5 mmHg MV Decel Time: 292 msec    TR Vmax:        221.00 cm/s MV E velocity: 61.80 cm/s                            SHUNTS                            Systemic VTI:  0.10 m                            Systemic Diam: 2.20 cm Vina Gull MD Electronically signed by Vina Gull MD Signature Date/Time: 10/09/2023/4:00:59 PM    Final    EEG adult Result Date: 10/09/2023 Shelton Arlin KIDD, MD     10/09/2023 10:45 AM Patient Name: RONALDO CRILLY MRN: [id number] Epilepsy Attending: Arlin KIDD Shelton Referring Physician/Provider: Georgina Basket, MD Date: 10/09/2023 Duration: 23.41 mins Patient history:  83 y.o. male with severe right-sided weakness and apparent right sided neglect. EEG to evaluate for seizure. Level of alertness: Awake AEDs during EEG study: LEV Technical aspects: This EEG study was done with scalp electrodes positioned according to the 10-20 International system of electrode placement. Electrical activity was reviewed with  band pass filter of 1-70Hz , sensitivity of 7 uV/mm, display speed of 63mm/sec with a 60Hz  notched filter applied as  appropriate. EEG data were recorded continuously and digitally stored.  Video monitoring was available and reviewed as appropriate. Description: The posterior dominant rhythm consists of 8 Hz activity of moderate voltage (25-35 uV) seen predominantly in posterior head regions, symmetric and reactive to eye opening and eye closing. EEG showed intermittent generalized 3 to 6 Hz theta-delta slowing. Hyperventilation and photic stimulation were not performed.   ABNORMALITY - Intermittent slow, generalized IMPRESSION: This study is suggestive of mild diffuse encephalopathy. No seizures or epileptiform discharges were seen throughout the recording. Arlin MALVA Krebs   MR BRAIN WO CONTRAST Result Date: 10/09/2023 EXAM: MRI BRAIN WITHOUT CONTRAST 04/14/2021 TECHNIQUE: Multiplanar multisequence MRI of the head/brain was performed without the administration of intravenous contrast. COMPARISON: CT head without contrast and CT angio head and neck and CT perfusion 10/08/23. CLINICAL HISTORY: FINDINGS: BRAIN AND VENTRICLES: No acute infarct. No intracranial hemorrhage. No mass. No midline shift. No hydrocephalus. The sella is unremarkable. Normal flow voids. Chronic encephalomalacia of the anterior left frontal lobe and insular cortex is stable. Ex vacuo dilation of the left lateral ventricle is again noted. 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 ORBITS: No acute abnormality. SINUSES AND MASTOIDS: Bilateral mastoid effusions are present. BONES AND SOFT TISSUES: Normal bone marrow signal. No acute soft tissue abnormality. IMPRESSION: 1. 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 2. 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 3.  Bilateral mastoid effusions. Electronically signed by: Lonni Necessary MD 10/09/2023 05:14 AM EDT RP Workstation: [id number]   CT ANGIO HEAD NECK W WO CM W PERF (CODE STROKE) Result Date: 10/08/2023 CLINICAL DATA:  Neuro deficit, acute, stroke suspected EXAM: CT ANGIOGRAPHY HEAD AND NECK CT PERFUSION BRAIN TECHNIQUE: Multidetector CT imaging of the head and neck was performed using the standard protocol during bolus administration of intravenous contrast. Multiplanar CT image reconstructions and MIPs were obtained to evaluate the vascular anatomy. Carotid stenosis measurements (when applicable) are obtained utilizing NASCET criteria, using the distal internal carotid diameter as the denominator. RADIATION DOSE REDUCTION: This exam was performed according to the departmental dose-optimization program which includes automated exposure control, adjustment of the mA and/or kV according to patient size and/or use of iterative reconstruction technique. CONTRAST:  OMNIPAQUE  IOHEXOL  350 MG/ML SOLN COMPARISON:  None Available. FINDINGS: CTA NECK FINDINGS Aortic arch: Great vessel origins are patent without significant stenosis. Right carotid system: Atherosclerosis of the distal common carotid artery without greater than 50% stenosis. Left carotid system: No evidence of dissection, stenosis (50% or greater) or occlusion. Vertebral arteries: Codominant. No evidence of dissection, stenosis (50% or greater) or occlusion. Skeleton: No acute fracture. Other neck: No acute abnormality on limited assessment. Upper chest: Lung apices are clear. Review of the MIP images confirms the above findings CTA HEAD FINDINGS Anterior circulation: Bilateral intracranial ICAs, MCAs and ACAs are patent without proximal hemodynamically significant stenosis. Posterior circulation: Bilateral intradural vertebral arteries, basilar artery and bilateral posterior legs are patent. Moderate left P2 PCA stenosis. Venous sinuses: Not well  assessed due to arterial contrast bolus timing. Review of the MIP images confirms the above findings IMPRESSION: 1. No emergent large vessel occlusion. 2. Moderate left P2 PCA  stenosis. Electronically Signed   By: Gilmore GORMAN Molt M.D.   On: 10/08/2023 23:51   CT HEAD CODE STROKE WO CONTRAST Result Date: 10/08/2023 CLINICAL DATA:  Code stroke.  Neuro deficit, acute, stroke suspected EXAM: CT HEAD WITHOUT CONTRAST TECHNIQUE: Contiguous axial images were obtained from the base of the skull through the vertex without intravenous contrast. RADIATION DOSE REDUCTION: This exam was performed according to the departmental dose-optimization program which includes automated exposure control, adjustment of the mA and/or kV according to patient size and/or use of iterative reconstruction technique. COMPARISON:  CT head 03/05/2022. FINDINGS: Brain: Remote left frontal and insular infarct. No evidence of acute large vascular territory infarct, acute hemorrhage, mass lesion or midline shift. No hydrocephalus. Vascular: No hyperdense vessel identified. Calcific atherosclerosis. Skull: No acute fracture. Sinuses/Orbits: No acute findings. Other: No mastoid effusions. ASPECTS [facility] Stroke Program Early CT Score) Total score (0-10 with 10 being normal): 10. IMPRESSION: 1. No evidence of acute intracranial abnormality. ASPECTS is 10. 2. Remote left frontal and insular infarct. Code stroke imaging results were communicated on 10/08/2023 at 11:30 pm to provider Dr. Michaela via secure text paging. Electronically Signed   By: Gilmore GORMAN Molt M.D.   On: 10/08/2023 23:31     Lamar GORMAN Chris 10/23/2023, 5:36 PM

## 2023-11-02 NOTE — Procedures (Signed)
 Patient Name: Edwin Wall  MRN: [id number]  Epilepsy Attending: Arlin MALVA Krebs  Referring Physician/Provider: Waddell Karna LABOR, NP  Date: 10/06/2023 Duration: 22.32 mins  Patient history: 83yo M with ams. EEG to evaluate for seizure  Level of alertness: comatose/ lethargic   AEDs during EEG study: LEV  Technical aspects: This EEG study was done with scalp electrodes positioned according to the 10-20 International system of electrode placement. Electrical activity was reviewed with band pass filter of 1-70Hz , sensitivity of 7 uV/mm, display speed of 3mm/sec with a 60Hz  notched filter applied as appropriate. EEG data were recorded continuously and digitally stored.  Video monitoring was available and reviewed as appropriate.  Description: EEG showed continuous generalized predominantly 5 to 6 Hz theta slowing admixed with intermittent generalized 2-3hz  delta slowing. Hyperventilation and photic stimulation were not performed.     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 No seizures or epileptiform discharges were seen throughout the recording.  Lecia Esperanza O Savva Beamer

## 2023-11-02 NOTE — Procedures (Signed)
 I have reviewed the CRRT procedure and made adjustments as needed.  Myer Fret MD  [facility] 10/23/2023, 3:49 PM

## 2023-11-02 DEATH — deceased

## 2024-04-15 ENCOUNTER — Ambulatory Visit: Admitting: Neurology
